# Patient Record
Sex: Female | Born: 1970 | Race: White | Hispanic: No | Smoking: Current every day smoker
Health system: Southern US, Community
[De-identification: ages and names within clinical notes are randomized; demographics above are authoritative.]

## PROBLEM LIST (undated history)

## (undated) DIAGNOSIS — K219 Gastro-esophageal reflux disease without esophagitis: Secondary | ICD-10-CM

## (undated) DIAGNOSIS — F4001 Agoraphobia with panic disorder: Secondary | ICD-10-CM

## (undated) DIAGNOSIS — F32A Depression, unspecified: Secondary | ICD-10-CM

## (undated) DIAGNOSIS — Z9889 Other specified postprocedural states: Secondary | ICD-10-CM

## (undated) DIAGNOSIS — R42 Dizziness and giddiness: Secondary | ICD-10-CM

## (undated) DIAGNOSIS — E119 Type 2 diabetes mellitus without complications: Secondary | ICD-10-CM

## (undated) DIAGNOSIS — G43909 Migraine, unspecified, not intractable, without status migrainosus: Secondary | ICD-10-CM

## (undated) DIAGNOSIS — K449 Diaphragmatic hernia without obstruction or gangrene: Secondary | ICD-10-CM

## (undated) DIAGNOSIS — I10 Essential (primary) hypertension: Secondary | ICD-10-CM

## (undated) DIAGNOSIS — Z8673 Personal history of transient ischemic attack (TIA), and cerebral infarction without residual deficits: Secondary | ICD-10-CM

## (undated) DIAGNOSIS — H8110 Benign paroxysmal vertigo, unspecified ear: Secondary | ICD-10-CM

## (undated) DIAGNOSIS — S6010XA Contusion of unspecified finger with damage to nail, initial encounter: Secondary | ICD-10-CM

## (undated) DIAGNOSIS — M779 Enthesopathy, unspecified: Secondary | ICD-10-CM

## (undated) DIAGNOSIS — M542 Cervicalgia: Secondary | ICD-10-CM

## (undated) DIAGNOSIS — G629 Polyneuropathy, unspecified: Secondary | ICD-10-CM

## (undated) DIAGNOSIS — K589 Irritable bowel syndrome without diarrhea: Secondary | ICD-10-CM

## (undated) DIAGNOSIS — F329 Major depressive disorder, single episode, unspecified: Secondary | ICD-10-CM

## (undated) DIAGNOSIS — E785 Hyperlipidemia, unspecified: Secondary | ICD-10-CM

## (undated) HISTORY — DX: Benign paroxysmal vertigo, unspecified ear: H81.10

## (undated) HISTORY — PX: TOE SURGERY: SHX1073

## (undated) HISTORY — PX: ABDOMINAL HYSTERECTOMY: SHX81

## (undated) HISTORY — PX: CARDIAC CATHETERIZATION: SHX172

## (undated) HISTORY — PX: TUBAL LIGATION: SHX77

## (undated) HISTORY — PX: APPENDECTOMY: SHX54

## (undated) HISTORY — DX: Enthesopathy, unspecified: M77.9

## (undated) HISTORY — PX: CYST EXCISION: SHX5701

## (undated) HISTORY — DX: Agoraphobia with panic disorder: F40.01

## (undated) HISTORY — DX: Personal history of transient ischemic attack (TIA), and cerebral infarction without residual deficits: Z86.73

## (undated) HISTORY — DX: Other specified postprocedural states: Z98.890

## (undated) HISTORY — PX: BUNIONECTOMY: SHX129

## (undated) HISTORY — DX: Type 2 diabetes mellitus without complications: E11.9

## (undated) HISTORY — DX: Contusion of unspecified finger with damage to nail, initial encounter: S60.10XA

## (undated) HISTORY — PX: CHOLECYSTECTOMY: SHX55

## (undated) HISTORY — DX: Polyneuropathy, unspecified: G62.9

## (undated) HISTORY — DX: Cervicalgia: M54.2

---

## 1999-08-25 ENCOUNTER — Ambulatory Visit (HOSPITAL_COMMUNITY): Admission: RE | Admit: 1999-08-25 | Discharge: 1999-08-25 | Payer: Self-pay

## 2000-12-21 ENCOUNTER — Encounter: Admission: RE | Admit: 2000-12-21 | Discharge: 2001-03-21 | Payer: Self-pay | Admitting: Internal Medicine

## 2001-06-12 ENCOUNTER — Emergency Department (HOSPITAL_COMMUNITY): Admission: EM | Admit: 2001-06-12 | Discharge: 2001-06-12 | Payer: Self-pay | Admitting: Emergency Medicine

## 2001-10-17 ENCOUNTER — Ambulatory Visit (HOSPITAL_COMMUNITY): Admission: RE | Admit: 2001-10-17 | Discharge: 2001-10-17 | Payer: Self-pay | Admitting: Family Medicine

## 2001-10-17 ENCOUNTER — Encounter: Payer: Self-pay | Admitting: Family Medicine

## 2001-10-20 ENCOUNTER — Ambulatory Visit (HOSPITAL_COMMUNITY): Admission: RE | Admit: 2001-10-20 | Discharge: 2001-10-20 | Payer: Self-pay | Admitting: Family Medicine

## 2001-10-20 ENCOUNTER — Encounter: Payer: Self-pay | Admitting: Family Medicine

## 2002-03-16 ENCOUNTER — Ambulatory Visit (HOSPITAL_COMMUNITY): Admission: RE | Admit: 2002-03-16 | Discharge: 2002-03-16 | Payer: Self-pay | Admitting: Family Medicine

## 2002-03-16 ENCOUNTER — Encounter: Payer: Self-pay | Admitting: Family Medicine

## 2002-06-21 ENCOUNTER — Emergency Department (HOSPITAL_COMMUNITY): Admission: EM | Admit: 2002-06-21 | Discharge: 2002-06-21 | Payer: Self-pay | Admitting: Emergency Medicine

## 2002-09-23 ENCOUNTER — Emergency Department (HOSPITAL_COMMUNITY): Admission: EM | Admit: 2002-09-23 | Discharge: 2002-09-23 | Payer: Self-pay | Admitting: Internal Medicine

## 2002-09-23 ENCOUNTER — Encounter: Payer: Self-pay | Admitting: *Deleted

## 2002-10-05 ENCOUNTER — Ambulatory Visit (HOSPITAL_COMMUNITY): Admission: RE | Admit: 2002-10-05 | Discharge: 2002-10-05 | Payer: Self-pay | Admitting: Family Medicine

## 2002-10-05 ENCOUNTER — Encounter: Payer: Self-pay | Admitting: Family Medicine

## 2002-10-22 ENCOUNTER — Ambulatory Visit (HOSPITAL_COMMUNITY): Admission: RE | Admit: 2002-10-22 | Discharge: 2002-10-22 | Payer: Self-pay | Admitting: Family Medicine

## 2002-10-22 ENCOUNTER — Encounter: Payer: Self-pay | Admitting: Family Medicine

## 2002-11-29 ENCOUNTER — Ambulatory Visit (HOSPITAL_COMMUNITY): Admission: RE | Admit: 2002-11-29 | Discharge: 2002-11-29 | Payer: Self-pay | Admitting: Family Medicine

## 2002-11-29 ENCOUNTER — Encounter: Payer: Self-pay | Admitting: Family Medicine

## 2003-05-09 ENCOUNTER — Emergency Department (HOSPITAL_COMMUNITY): Admission: EM | Admit: 2003-05-09 | Discharge: 2003-05-09 | Payer: Self-pay | Admitting: Internal Medicine

## 2003-07-30 ENCOUNTER — Emergency Department (HOSPITAL_COMMUNITY): Admission: EM | Admit: 2003-07-30 | Discharge: 2003-07-30 | Payer: Self-pay | Admitting: Internal Medicine

## 2003-10-30 ENCOUNTER — Observation Stay (HOSPITAL_COMMUNITY): Admission: EM | Admit: 2003-10-30 | Discharge: 2003-10-31 | Payer: Self-pay | Admitting: *Deleted

## 2004-02-26 ENCOUNTER — Emergency Department (HOSPITAL_COMMUNITY): Admission: EM | Admit: 2004-02-26 | Discharge: 2004-02-26 | Payer: Self-pay | Admitting: *Deleted

## 2004-06-04 ENCOUNTER — Emergency Department (HOSPITAL_COMMUNITY): Admission: EM | Admit: 2004-06-04 | Discharge: 2004-06-04 | Payer: Self-pay | Admitting: Emergency Medicine

## 2004-06-26 ENCOUNTER — Ambulatory Visit (HOSPITAL_COMMUNITY): Admission: RE | Admit: 2004-06-26 | Discharge: 2004-06-26 | Payer: Self-pay | Admitting: Family Medicine

## 2004-08-07 ENCOUNTER — Ambulatory Visit (HOSPITAL_COMMUNITY): Admission: RE | Admit: 2004-08-07 | Discharge: 2004-08-07 | Payer: Self-pay | Admitting: Family Medicine

## 2004-08-08 ENCOUNTER — Ambulatory Visit: Payer: Self-pay | Admitting: Internal Medicine

## 2004-08-08 ENCOUNTER — Inpatient Hospital Stay (HOSPITAL_COMMUNITY): Admission: RE | Admit: 2004-08-08 | Discharge: 2004-08-10 | Payer: Self-pay | Admitting: Family Medicine

## 2004-08-28 ENCOUNTER — Ambulatory Visit: Payer: Self-pay | Admitting: Internal Medicine

## 2004-09-01 ENCOUNTER — Ambulatory Visit (HOSPITAL_COMMUNITY): Admission: RE | Admit: 2004-09-01 | Discharge: 2004-09-01 | Payer: Self-pay | Admitting: Internal Medicine

## 2004-09-01 ENCOUNTER — Ambulatory Visit: Payer: Self-pay | Admitting: Internal Medicine

## 2004-09-30 ENCOUNTER — Ambulatory Visit: Payer: Self-pay | Admitting: Internal Medicine

## 2005-03-23 ENCOUNTER — Ambulatory Visit: Payer: Self-pay | Admitting: Urgent Care

## 2005-03-25 ENCOUNTER — Ambulatory Visit (HOSPITAL_COMMUNITY): Admission: RE | Admit: 2005-03-25 | Discharge: 2005-03-25 | Payer: Self-pay | Admitting: Internal Medicine

## 2005-04-20 ENCOUNTER — Observation Stay (HOSPITAL_COMMUNITY): Admission: EM | Admit: 2005-04-20 | Discharge: 2005-04-21 | Payer: Self-pay | Admitting: Emergency Medicine

## 2005-06-07 ENCOUNTER — Emergency Department (HOSPITAL_COMMUNITY): Admission: EM | Admit: 2005-06-07 | Discharge: 2005-06-07 | Payer: Self-pay | Admitting: Emergency Medicine

## 2005-06-11 ENCOUNTER — Ambulatory Visit (HOSPITAL_COMMUNITY): Admission: RE | Admit: 2005-06-11 | Discharge: 2005-06-11 | Payer: Self-pay | Admitting: Family Medicine

## 2005-06-14 ENCOUNTER — Ambulatory Visit (HOSPITAL_COMMUNITY): Admission: RE | Admit: 2005-06-14 | Discharge: 2005-06-14 | Payer: Self-pay | Admitting: Internal Medicine

## 2005-06-16 ENCOUNTER — Ambulatory Visit: Payer: Self-pay | Admitting: Internal Medicine

## 2005-06-17 ENCOUNTER — Emergency Department (HOSPITAL_COMMUNITY): Admission: EM | Admit: 2005-06-17 | Discharge: 2005-06-18 | Payer: Self-pay | Admitting: Emergency Medicine

## 2005-07-03 ENCOUNTER — Emergency Department (HOSPITAL_COMMUNITY): Admission: EM | Admit: 2005-07-03 | Discharge: 2005-07-03 | Payer: Self-pay | Admitting: Emergency Medicine

## 2005-07-06 ENCOUNTER — Ambulatory Visit: Payer: Self-pay | Admitting: Internal Medicine

## 2005-07-06 ENCOUNTER — Ambulatory Visit (HOSPITAL_COMMUNITY): Admission: RE | Admit: 2005-07-06 | Discharge: 2005-07-06 | Payer: Self-pay | Admitting: Internal Medicine

## 2005-09-03 ENCOUNTER — Ambulatory Visit: Payer: Self-pay | Admitting: Cardiology

## 2005-09-05 ENCOUNTER — Inpatient Hospital Stay (HOSPITAL_COMMUNITY): Admission: AD | Admit: 2005-09-05 | Discharge: 2005-09-07 | Payer: Self-pay | Admitting: Cardiovascular Disease

## 2005-09-05 ENCOUNTER — Ambulatory Visit: Payer: Self-pay | Admitting: Cardiovascular Disease

## 2005-11-16 ENCOUNTER — Ambulatory Visit (HOSPITAL_COMMUNITY): Admission: RE | Admit: 2005-11-16 | Discharge: 2005-11-16 | Payer: Self-pay | Admitting: Internal Medicine

## 2005-12-22 ENCOUNTER — Ambulatory Visit (HOSPITAL_COMMUNITY): Admission: RE | Admit: 2005-12-22 | Discharge: 2005-12-22 | Payer: Self-pay | Admitting: Family Medicine

## 2006-01-19 ENCOUNTER — Ambulatory Visit (HOSPITAL_COMMUNITY): Admission: RE | Admit: 2006-01-19 | Discharge: 2006-01-19 | Payer: Self-pay | Admitting: Family Medicine

## 2006-10-11 ENCOUNTER — Ambulatory Visit: Payer: Self-pay | Admitting: Cardiology

## 2006-10-14 ENCOUNTER — Ambulatory Visit: Payer: Self-pay | Admitting: Cardiology

## 2006-10-19 ENCOUNTER — Inpatient Hospital Stay (HOSPITAL_BASED_OUTPATIENT_CLINIC_OR_DEPARTMENT_OTHER): Admission: RE | Admit: 2006-10-19 | Discharge: 2006-10-19 | Payer: Self-pay | Admitting: Cardiology

## 2006-10-19 ENCOUNTER — Ambulatory Visit: Payer: Self-pay | Admitting: Cardiology

## 2006-12-14 ENCOUNTER — Emergency Department (HOSPITAL_COMMUNITY): Admission: EM | Admit: 2006-12-14 | Discharge: 2006-12-14 | Payer: Self-pay | Admitting: *Deleted

## 2007-04-26 ENCOUNTER — Ambulatory Visit (HOSPITAL_COMMUNITY): Admission: RE | Admit: 2007-04-26 | Discharge: 2007-04-26 | Payer: Self-pay | Admitting: Family Medicine

## 2007-11-21 ENCOUNTER — Ambulatory Visit: Payer: Self-pay | Admitting: Cardiovascular Disease

## 2007-11-21 ENCOUNTER — Ambulatory Visit: Payer: Self-pay | Admitting: Cardiology

## 2007-11-21 ENCOUNTER — Inpatient Hospital Stay (HOSPITAL_COMMUNITY): Admission: AD | Admit: 2007-11-21 | Discharge: 2007-11-22 | Payer: Self-pay | Admitting: Cardiology

## 2008-09-03 ENCOUNTER — Ambulatory Visit: Payer: Self-pay | Admitting: Cardiology

## 2010-08-22 ENCOUNTER — Encounter: Payer: Self-pay | Admitting: Emergency Medicine

## 2010-08-22 ENCOUNTER — Encounter (INDEPENDENT_AMBULATORY_CARE_PROVIDER_SITE_OTHER): Payer: Self-pay | Admitting: Internal Medicine

## 2010-08-23 ENCOUNTER — Encounter (INDEPENDENT_AMBULATORY_CARE_PROVIDER_SITE_OTHER): Payer: Self-pay | Admitting: Internal Medicine

## 2010-08-23 ENCOUNTER — Encounter: Payer: Self-pay | Admitting: Internal Medicine

## 2010-09-18 DIAGNOSIS — R079 Chest pain, unspecified: Secondary | ICD-10-CM

## 2010-12-15 NOTE — Discharge Summary (Signed)
NAMEFELMA, PFEFFERLE NO.:  [id number]   MEDICAL RECORD NO.:  [id number]          PATIENT TYPE:  INP   LOCATION:  2033                         FACILITY:  [facility]   PHYSICIAN:  Jonelle Sidle, MD DATE OF BIRTH:  03/05/1971   DATE OF ADMISSION:  11/21/2007  DATE OF DISCHARGE:  11/22/2007                               DISCHARGE SUMMARY   PROCEDURES:  1. Cardiac catheterization.  2. Coronary arteriogram.  3. Left ventriculogram.  4. CT angiogram of the chest.   PRIMARY FINAL DISCHARGE DIAGNOSIS:  Chest pain, troponin I 0.42 at  [facility] with normal coronary arteries and no acute chest disease by CT.   SECONDARY DIAGNOSES:  1. Insulin-dependent diabetes.  2. Hypertension.  3. Hyperlipidemia.  4. Tobacco use.  5. Family history of coronary artery disease.  6. Allergy or intolerance to penicillin and sulfa.   TIME AT DISCHARGE:  45 minutes.   HOSPITAL COURSE:  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  She  went to [facility] for chest pain.  She was evaluated there by  Dr.DeGent and had elevation of troponin to 0.42.  She had a history of  an abnormal Cardiolite prior to a cath in 2008 showing nonobstructive  disease, so it was felt that the best option was to do a repeat heart  catheterization.  She was transferred to [facility].   The cardiac catheterization showed no significant coronary artery  disease.  Her EF was 55%-60% with no MR.  Dr. Excell Seltzer felt that with  significant chest pain and abnormal cardiac enzymes, a D-dimer should be  checked and this was also mildly abnormal.  With an abnormal D-dimer, a  chest CT was performed.  It showed no pulmonary embolus and no acute  process with the earlier noted lung nodules resolved.   On November 22, 2007, [name] had significantly improved.  She was having  no chest pain and no shortness of breath.  Her O2 saturation was  95% on  room air and her cath site was without bruit or hematoma.  Dr. Diona Browner  evaluated her and felt that she could be safely discharged home with  outpatient followup in [place].   DISCHARGE INSTRUCTIONS:  1. Her activity level is to be increased gradually.  2. She is to call our office for any problems with the cath site.  3. She is to stick to a low-sodium diabetic diet.  4. She is to follow up with Dr. Andee Lineman on May 6 at 1:30 and with Dr.      Antoine Poche as needed.   DISCHARGE MEDICATIONS:  1. Lisinopril 10 mg daily.  2. Prevacid 20 mg a day.  3. Reglan 10 mg 4 times a day.  4. Lexapro 10 mg a day.  5. Zocor 10 mg a day.  6. Premarin 1.25 mg daily.  7. 75/25 insulin 40 units q.a.m.  8. Lantus 30 units at bedtime.  9. Sliding scale as at home.  10.Metoprolol 25 mg b.i.d.  11.Aspirin 81 mg daily.  Theodore Demark, PA-C      Jonelle Sidle, MD  Electronically Signed    RB/MEDQ  D:  11/22/2007  T:  11/23/2007  Job:  [id number]   cc:   Rollene Rotunda, MD, [facility]

## 2010-12-15 NOTE — Cardiovascular Report (Signed)
[name], [name] NO.:  [id number]   MEDICAL RECORD NO.:  [id number]          PATIENT TYPE:  INP   LOCATION:  2033                         FACILITY:  [facility]   PHYSICIAN:  Veverly Fells. Excell Seltzer, MD  DATE OF BIRTH:  [birth year]   DATE OF PROCEDURE:  11/21/2007  DATE OF DISCHARGE:                            CARDIAC CATHETERIZATION   PROCEDURE:  1. Left heart catheterization.  2. Selective coronary angiography.  3. Left ventricular angiography.   INDICATIONS:  [name] is a 40 year old woman with multiple cardiac  risk factors.  This includes type 1 diabetes with an insulin pump,  hypertension, hypercholesterolemia, tobacco use, and a strong family  history.  She presented with chest pain consistent with an acute  coronary syndrome.  Her initial biomarkers at [facility] were  positive with the troponin of 0.4.  Her biomarkers here at [facility]  are negative.  However, she has ongoing chest pain and was referred for  cardiac cath.   Risks and indications of the procedure were reviewed with the patient.  Informed consent was obtained.  The right groin was prepped, draped, and  anesthetized with 1% lidocaine.  Using modified Seldinger technique a 5-  French sheath was placed in the right femoral artery.  Standard 5-French  Judkins catheters were used for coronary angiography and left  ventriculography.  The patient tolerated the procedure well.  All  catheter exchanges were performed over a guidewire.  There were no  immediate complications.   FINDINGS:  Aortic pressure 92/56 with a mean of 73 and left ventricular  pressure 98/10.   Left mainstem:  Widely patent bifurcates into LAD and left circumflex.   LAD:  LAD is widely patent.  The course is down to the LV apex.  It is a  large vessel that supplies a moderate-sized first diagonal branch.  There is no significant plaque formation throughout.   Left circumflex:  Left circumflex is a large vessel  that is dominant.  There is a small ramus intermedius, small first OM, large second and  third OM branches, and a moderate-sized left PDA.  There is no  significant stenosis throughout the left circumflex.   Right coronary artery:  The right coronary artery is small and  nondominant.  It supplies two RV marginal branches.  There is no  significant stenosis throughout.   Left ventricular function is normal.  The LVEF is 55% to 60%.  There is  no mitral regurgitation.  The left ventriculography was performed in the  30 degrees RAO projection.   ASSESSMENT:  1. Normal coronary arteries.  2. Normal left ventricular function.   PLAN:  [name] appears to have noncardiac chest pain.  She has normal  coronaries and normal LV function.  I will check a D-dimer, but I  suspect this is either GI or musculoskeletal in nature.  I will observe  overnight, follow her clinically in the morning.      Veverly Fells. Excell Seltzer, MD  Electronically Signed     MDC/MEDQ  D:  11/21/2007  T:  11/22/2007  Job:  [id number]

## 2010-12-18 NOTE — H&P (Signed)
[name], [name]                          ACCOUNT NO.:  [id number]   MEDICAL RECORD NO.:  [id number]                   PATIENT TYPE:  INP   LOCATION:  IC10                                 FACILITY:  [facility]   PHYSICIAN:  Patrica Duel, M.D.                 DATE OF BIRTH:  Jul 25, 1971   DATE OF ADMISSION:  10/29/2003  DATE OF DISCHARGE:                                HISTORY & PHYSICAL   CHIEF COMPLAINT:  Chest pain.   HISTORY OF PRESENT ILLNESS:  This is a 40 year old female with a history of  insulin-dependent diabetes mellitus times approximately 24 years.  She is  currently well-controlled with the use of insulin pump.  She is also status  post cholecystectomy.  She has anxiety and depression relatively well  controlled at this time as well.   The patient presented to the emergency department with a several-week  history of increasingly severe and reproducible substernal chest pain  primarily the left anterior chest, with radiation to the left shoulder and  back.  She also has associated dyspnea and occasional diaphoretic episodes.  She has had no palpitations or syncopal episodes.   The emergency room workup was essentially benign.  D-Dimer was normal.  Cardiac enzymes were normal with EKG nonacute.   The patient was admitted for thorough cardiac evaluation to rule out  premature coronary disease.  Her risk factors include diabetes as well as a  strongly positive family history.  Her mother died at age 15 from myocardial  infarction and cerebrovascular accident as well.   There is no history of headaches, neurologic deficits, nausea, vomiting,  diarrhea, melena, hematemesis, hematochezia or genitourinary symptoms.   CURRENT MEDICATIONS:  1. Wellbutrin 150 daily.  2. Clonazepam 0.5 q.i.d. p.r.n.  3. Celexa 20 mg daily.  4. She is also on a Novolog insulin pump.  5. Topamax 100 b.i.d.  6. Hyoscyamine 0.125 p.r.n.   ALLERGIES:  PENICILLIN.   PAST MEDICAL HISTORY:  As  noted above.   FAMILY HISTORY:  As noted.  One sister has a history of blood clots.  Father is alive and well.   REVIEW OF SYSTEMS:  Negative except for as mentioned.   SOCIAL HISTORY:  She is a nonsmoker, nondrinker.   PHYSICAL EXAMINATION:  GENERAL:  A very-pleasant female, in no acute  distress.  VITAL SIGNS:  Blood pressure 122/77, heart rate 75 and regular.  She is  afebrile.  Respirations 18.  O2 saturations 99%.  HEENT:  Normocephalic, atraumatic.  Pupils are equal.  Ears, nose and throat  are benign.  NECK:  Supple, no bruits are noted.  HEART:  Sounds are essentially normal.  LUNGS:  Clear.  ABDOMEN:  Nontender, nondistended.  Bowel sounds are intact.  EXTREMITIES:  Without clubbing, cyanosis or edema.  NEUROLOGIC:  Exam is within normal limits.   LABORATORY DATA:  As noted.   ASSESSMENT:  Chest pain  in a very young female with multiple risk factors.   PLAN:  1. Cardiolite stress as soon as possible.  2. Further evaluation and therapy as per cardiology.  3. We will follow with you expectantly.     ___________________________________________                                         Patrica Duel, M.D.   MC/MEDQ  D:  10/30/2003  T:  10/30/2003  Job:  [id number]

## 2010-12-18 NOTE — Cardiovascular Report (Signed)
NAMEKIRBI, FARRUGIA NO.:  [id number]   MEDICAL RECORD NO.:  [id number]          PATIENT TYPE:  INP   LOCATION:  2926                         FACILITY:  [facility]   PHYSICIAN:  Arturo Morton. Riley Kill, M.D. [facility] OF BIRTH:  07/31/1971   DATE OF PROCEDURE:  09/06/2005  DATE OF DISCHARGE:                              CARDIAC CATHETERIZATION   INDICATIONS:  [name] is a 40 year old who has type 1 diabetes mellitus.  She is on an insulin pump. She recently presented with recurrent episodes of  substernal chest pain. She has started smoking over the past two years and  just quit passed over the weekend. She is on estrogen as well. She had a CT  angiogram which suggested some haziness possibly in the LAD and a result of  this was subsequently referred for diagnostic catheterization.   PROCEDURES:  1.  Left heart catheterization.  2.  Selective coronary arteriography.  3.  Selective left ventriculography   DESCRIPTION OF PROCEDURE:  The patient was brought to the catheterization  laboratory after informed consent and prepped and draped in the usual  fashion. Through an anterior puncture, the right femoral artery was entered  easily. We tried to place a 4-French sheath, but this would not get through  the vessel very well. We ended up using the dilator and then placed a 6-  [place] sheath which slid in very nicely. A brief femoral angiogram was done  which documented the entry site to be above the bifurcation. Following this,  views of the left and right coronary arteries were obtained. Central aortic  and left ventricular pressures were measured with pigtail. Ventriculography  was performed in the RAO projection without complication. She was taken to  the holding area in satisfactory clinical condition. She also had an Accu-  Chek that was 47. We stopped her insulin pump. We gave her some apple juice.  A recheck was in the mid 60s. We also started a dextrose drip.   HEMODYNAMIC DATA:  1.  Central aortic pressure 120/70.  2.  Left ventricular pressure 111/20.  3.  No gradient on pullback across the aortic valve.   ANGIOGRAPHIC DATA:  1.  Ventriculography was performed in the RAO projection. Overall systolic      function appeared to preserved. Overall ejection fraction felt to be in      excess of 55-60%. There did not appear to be significant mitral      regurgitation.  2.  The right coronary was a nondominant small caliber vessel.  3.  The left main is a large-caliber vessel that bifurcates into an LAD and      circumflex system. The circumflex itself was dominant. The left main is      free of critical disease.  4.  The left anterior descending artery courses to the apex. There was one      major diagonal branch. The LAD appears to be smooth throughout without      any evidence of obvious haziness. We were able to obtain views in      multiple angiographic projections. Likewise,  the takeoff of the diagonal      did not appear to be significantly compromised. We were able to see this      in both the LAO views, the RAO cranial views and in the LAO caudal      views. There did not appear to be significant focal area of obstruction.  5.  The circumflex is a dominant vessel. There is an insignificant      intermediate vessel.  6.  There is a large obtuse marginal that is free of critical disease. There      is a second moderate-sized obtuse marginal. It has minimal proximal      irregularity but no significant focal obstruction. The distal circumflex      provides a posterior descending branch which appears to be free of      critical obstruction.   CONCLUSION:  1.  Well-preserved left ventricular function.  2.  No evidence of high-grade stenosis or ruptured plaque in the left      anterior descending artery.  3.  Nondominant right coronary artery.   DISPOSITION:  Further evaluation by Dr. Andee Lineman. We will stop the patient's   anticoagulants.      Arturo Morton. Riley Kill, M.D. [facility]  Electronically Signed     TDS/MEDQ  D:  09/06/2005  T:  09/06/2005  Job:  [id number]   cc:   Learta Codding, M.D. [facility]  [address]  [address]  [place]  [place] [address]   CV Laboratory   Patient's medical record   Patrica Duel, M.D.  Fax: [phone]   Sherrie Mustache, M.D.  Fax: [phone]   Corrie Mckusick, M.D.  Fax: [phone]   Wallingford Bing, M.D. [facility]  [address]  [address]  [place]  [place] [address]

## 2010-12-18 NOTE — Cardiovascular Report (Signed)
NAMELAILIE, Mckenzie NO.:  [id number]   MEDICAL RECORD NO.:  [id number]          PATIENT TYPE:  OIB   LOCATION:  NA                           FACILITY:  [facility]   PHYSICIAN:  Jonelle Sidle, MD DATE OF BIRTH:  07-04-1971   DATE OF PROCEDURE:  DATE OF DISCHARGE:                            CARDIAC CATHETERIZATION   INDICATIONS:  [name] is a 40 year old woman with a history of type 1  diabetes mellitus, hypertension, hyperlipidemia, gastroesophageal reflux  disease and diabetic neuropathy as well as gastroparesis.  She was  recently admitted to [facility] with chest pain and  ruled out for myocardial infarction.  Her cardiac history includes a  previous cardiac CT scan in February of 2007 demonstrating some  haziness in the left anterior descending which ultimately resulted in  a cardiac catheterization revealing no significant coronary artery  disease.  More recently, she underwent a myocardial perfusion study  which indicated a small to moderate defect in the anterior wall  suggestive of ischemia with an ejection fraction of 60%.  This  information was reviewed with the patient and she was scheduled for a  diagnostic cardiac catheterization to redefine the coronary anatomy.  The potential risks and benefits were explained to her in advance and  informed consent was obtained.   PROCEDURES PERFORMED:  1. Left heart catheterization.  2. Selective coronary angiography.  3. Left ventriculography.   ACCESS AND EQUIPMENT:  The area about the right femoral artery was  anesthetized with 1% lidocaine and a 4-French sheath was placed in the  right femoral artery via modified Seldinger technique.  Standard  preformed 4-French JL-4 and 3-D RCA catheters were used for selective  coronary angiography and an angled pigtail catheter was used for left  heart catheterization left ventriculography.  All exchanges were made  over a wire.  A total of 70 mL  Omnipaque were used.  The patient  tolerated the procedure well without immediate complications.   HEMODYNAMICS:  Aorta:  103/64 mmHg.  Left ventricle:  103/11 mmHg.   ANGIOGRAPHIC FINDINGS:  1. The left main coronary artery is smooth and gives rise to the left      anterior descending, the circumflex coronary artery and a small      ramus intermedius branch.  2. The left anterior descending is a medium-sized vessel with one      large proximal diagonal branch.  The left anterior descending      tapers beginning in the mid vessel distally without obvious focal      stenosis.  This area may represent an anatomical decrease in vessel      caliber; however, diffuse atherosclerosis in the setting of      diabetes is also certainly possible.  No clear flow-limiting      stenosis is noted, however.  3. The circumflex coronary artery is a medium to large caliber      dominant vessel.  There are two obtuse marginal branches that are      large and a posterior descending system.  Some mild luminal  irregularities are noted predominantly within the second obtuse      marginal branch, but no flow-limiting stenoses are noted.  4. There is a small ramus intermedius noted without flow-limiting      stenosis.  5. There is a small nondominant right coronary artery with small right      ventricular marginal branches.  No significant flow-limiting      stenosis noted.   Left ventriculography was performed and the RAO projection reveals an  ejection fraction of approximately 60% with no focal wall motion  abnormality and no significant mitral regurgitation.   DIAGNOSES:  1. No obstructive coronary artery disease noted within the major      epicardial vessels.  There is some tapering of the mid to distal      left anterior descending which may be anatomic versus possibly      indicative of atherosclerotic plaque, particularly in the setting      of diabetes mellitus.  This area does not appear to  be clearly flow-      limiting however.  2. Left ventricular ejection fraction is approximately 60% with no      aortic valve pullback gradient, no significant mitral      regurgitation, and a left ventricular end-diastolic pressure of 11      mmHg.   DISCUSSION:  I reviewed results with the patient, her family member and  Dr. Andee Lineman by phone.  At this point, would anticipate medical therapy  for general risk factor modification.  She more than likely does have  endothelial dysfunction which could certainly be angina provoking and  anti-anginal therapy would also be reasonable.  She has already been  placed on Imdur.  Aggressive diabetes and lipid management are also  clearly important.  I reviewed this with the patient and she will follow  up in the [facility] office with Dr. Andee Lineman.      Jonelle Sidle, MD  Electronically Signed     SGM/MEDQ  D:  10/19/2006  T:  10/19/2006  Job:  [id number]   cc:   Learta Codding, MD,FACC

## 2010-12-18 NOTE — Discharge Summary (Signed)
[name], [name]                ACCOUNT NO.:  [id number]   MEDICAL RECORD NO.:  [id number]          PATIENT TYPE:  INP   LOCATION:  A207                          FACILITY:  [facility]   PHYSICIAN:  Patrica Duel, M.D.    DATE OF BIRTH:  [birth year]   DATE OF ADMISSION:  08/07/2004  DATE OF DISCHARGE:  LH                                 DISCHARGE SUMMARY   DISCHARGE DIAGNOSES:  1.  Chest pain, somewhat atypical, question acute coronary syndrome.  2.  Insulin-dependent diabetes mellitus onset age 29, currently controlled.  3.  Migraine headaches.  4.  Moderate anxiety and depression.  5.  Status post hysterectomy, currently on estrogen therapy.   For details regarding admission, please refer to the chart.   This is a 40 year old female who presented to the office with a three day  history of increasingly severe intermittent chest tightness and mild  dyspnea.  Office evaluation was benign.  She was sent to the emergency  department where cardiac markers were negative.  D-dimer normal and O2 sats  98%.  She was admitted with chest pain, consider angina pectoris.   COURSE IN THE HOSPITAL:  The patient has been stable overnight.  She has had  some recurring chest tightness.  Cardiac enzymes have remained stable or  negative, and her vital signs are normal.  An EKG obtained this morning  revealed a slight degree of T wave inversion in the anteroseptal leads of  questionable significance.   Given the patient's risk factors and EKG changes, it is deemed necessary to  send her to [facility] for probable cardiac catheterization.  I have  discussed the situation with Dr. Domingo Sep, and she will accept the patient  in transfer.  [facility] will be transferring the patient.   DISPOSITION:  As per [facility].  She will be followed expectantly upon  her return.      MC/MEDQ  D:  08/08/2004  T:  08/08/2004  Job:  [id number]

## 2010-12-18 NOTE — Op Note (Signed)
[name], [name]                ACCOUNT NO.:  [id number]   MEDICAL RECORD NO.:  [id number]          PATIENT TYPE:  AMB   LOCATION:  DAY                           FACILITY:  [facility]   PHYSICIAN:  Lionel December, M.D.    DATE OF BIRTH:  1970/10/14   DATE OF PROCEDURE:  DATE OF DISCHARGE:                                 OPERATIVE REPORT   PROCEDURE:  Esophagogastroduodenoscopy with esophageal dilatation.   INDICATIONS:  [name] is a 40 year old Caucasian female with atypical  chest pain whose cardiac evaluation including cardiac catheterization,  Cardiolite, and echocardiography have all been normal.  She has been on PPI  without symptomatic improvement.  She had chest CT which suggested  thickening to the distal esophagus.  She is therefore here for evaluation.  She denies frequent heartburn.  She has intermittent dysphagia to solids as  well as liquids.  She says her symptoms are worse at night, and she has some  difficulty breathing and has to walk around to get relief.  Her LFTs done  this morning are within normal limits.  Her AST is 19 and ALT 18.   Procedure risks were reviewed with the patient, and informed consent was  obtained.   PREOPERATIVE MEDICATIONS:  Cetacaine spray for pharyngeal topical  anesthesia, Demerol 50 mg IV, and Versed 8 mg IV in divided doses.   FINDINGS:  Procedure was performed in endoscopy suite.  The patient's vital  signs and O2 saturation were monitored during the procedure and remained  stable.  The patient was placed in the left lateral position.  The Olympus  video endoscope was passed through the oropharynx without any difficulty  into the esophagus.   Esophagus:  Mucosa of the esophagus was normal except there was some  erythema at the GE junction which was located at 38 cm.  The GE junction was  wavy, but no erosions or ulcers were noted.  The hiatus was at 40 cm.  She  had small sliding hiatal hernia.  The stomach was empty, and it  distended  very well with insufflation.  Folds in the proximal stomach were normal.  Examination of the mucosa, gastric body, pyloric channel, as well as  angularis, fundus, and cardia were normal.   Duodenum:  The bulbar mucosa was normal.  The scope was passed into the  second part of the duodenal mucosa, and folds were normal.  The endoscope  was withdrawn.   The esophagus was dilated by passing a 56-French Maloney dilator to full  insertion.  As the dilator was withdrawn, esophageal mucosa was reexamined.  There was no mucosal disruption.  The endoscope was withdrawn.  The patient  tolerated the procedure well.   FINAL DIAGNOSES:  Mild changes of reflux esophagitis limited to  gastroesophageal junction without ring or stricture formation.  Small  sliding hiatal hernia.   Normal examination of the stomach, first and second part of the duodenum.   Even though the patient's symptoms are atypical, she still could be  refluxing.   RECOMMENDATIONS:  Antireflux measures were reinforced.  Will increase her  Protonix to 40 mg before breakfast and evening meals.  Next, add OTC Zantac  150 mg at bedtime.  She will keep a symptomatic diary and return for OV in 4  weeks.  If symptoms persist, will consider esophageal manometry and a pH  study prior to solid phase gastric emptying study.      NR/MEDQ  D:  09/01/2004  T:  09/01/2004  Job:  [id number]   cc:   Corrie Mckusick, M.D.  Fax: [phone]   Dorisann Frames, M.D.  [address], [place] [address]  Fax: [phone]

## 2010-12-18 NOTE — Discharge Summary (Signed)
[name], [name] NO.:  [id number]   MEDICAL RECORD NO.:  [id number]          PATIENT TYPE:  INP   LOCATION:  2021                         FACILITY:  [facility]   PHYSICIAN:  Arvilla Meres, M.D. LHCDATE OF BIRTH:  Dec 30, 1970   DATE OF ADMISSION:  09/05/2005  DATE OF DISCHARGE:  09/07/2005                                 DISCHARGE SUMMARY   PRIMARY DIAGNOSES:  1.  Chest pain, cardiac catheterization without significant coronary artery      disease and a normal ejection fraction, D-dimer within normal limits      possibly secondary to gastroesophageal reflux disease symptoms.  2.  Insulin dependent diabetes mellitus.  3.  A strong family history of premature coronary artery disease.  4.  History of tobacco use, quit a week ago.  5.  History of depression/anxiety.  6.  Irritable bowel syndrome, normal colonoscopy.  7.  History of migraine.  8.  History of hepatic hemangioma.  9.  History of gastroesophageal reflux disease/small hiatal hernia by EGD.   ALLERGY OR INTOLERANCE:  1.  PENICILLIN.  2.  SULFA.  3.  CODEINE.  4.  MYCINS.   PROCEDURES:  1.  Cardiac catheterization.  2.  Coronary arteriogram.  3.  Left ventriculogram.   HOSPITAL COURSE:  [name] is a 40 year old female with a history of chest  pain, but multiple cardiac risk factors for coronary artery disease.  She  had a cath in January of 2006 that showed normal coronaries.  She reported a  history of chest pain for about two weeks prior to admission that was  somewhat worse with exertion.  She was seen by Dr. Andee Lineman and had a coronary  CT, but there was a mid LAD haziness, whose etiology and significance was  unclear.   Because of the LAD haziness, she was transferred from [facility] to  [facility] for further evaluation of catheterization.   The catheterization showed a left dominant system.  She had minimal  irregularities in the origin of the OM2, but otherwise clean  coronaries.  Her left ventriculogram was normal.   The next day her post procedure labs were within normal limits.  It was  suspected that she had reflux symptoms responsible for her chest pain and  she was switched from Zantac to Protonix.  She was ambulating without chest  pain or shortness of breath and considered stable for discharge on September 07, 2005 with outpatient follow up arranged.   TIME AT DISCHARGE:  Thirty-eight minutes.   DISCHARGE INSTRUCTIONS:  1.  Her activity level is to be decreased with no driving over the next two      days and no heavy lifting for a week.  2.  She is to stick to a low-fat diabetic diet.  3.  She is to call our office for problems with the cath site.   DISCHARGE MEDICATIONS:  1.  Aspirin 81 mg a day.  2.  Toprol-XL 50 mg 1/2 tab daily.  3.  Lisinopril 5 mg a day.  4.  Zocor 20 mg a day.  5.  Celexa 40 mg a day.  6.  Multivitamin daily.  7.  Protonix 40 mg a day.  8.  She is not to take Zantac any more.      Theodore Demark, P.A. [facility]      Arvilla Meres, M.D. [facility]  Electronically Signed    RB/MEDQ  D:  09/07/2005  T:  09/07/2005  Job:  [id number]   cc:   Jae Dire, P.A. [facility]   Patrica Duel, M.D.  Fax: [phone]

## 2010-12-18 NOTE — Discharge Summary (Signed)
NAMEJESSEY, Mckenzie                ACCOUNT NO.:  [id number]   MEDICAL RECORD NO.:  [id number]          PATIENT TYPE:  INP   LOCATION:  3707                         FACILITY:  [facility]   PHYSICIAN:  Chales Salmon. Abigail Miyamoto, M.D.DATE OF BIRTH:  19-Nov-1970   DATE OF ADMISSION:  08/08/2004  DATE OF DISCHARGE:  08/10/2004                                 DISCHARGE SUMMARY   PROCEDURES:  1.  Cardiac catheterization.  2.  Coronary arteriogram.  3.  Left ventriculogram.   HISTORY OF PRESENT ILLNESS:  [name] is a 40 year old female no known  history of coronary artery disease.  She had chest pain in March of 2005  with a normal echocardiogram and a normal Cardiolite.  She developed chest  tightness and shortness of breath with some associated nausea and was having  symptoms for almost a week off and on.  She was admitted to [facility] for further evaluation and treatment.   HOSPITAL COURSE:  Her enzymes were negative for MI and it was felt that a  cardiac catheterization was indicated to further evaluate her anatomy.  The  cardiac catheterization was performed on August 10, 2004.   The cardiac catheterization showed no significant coronary artery disease  and an EF of approximately 50% with a transient left bundle branch block  that was catheter induced.  Medical therapy was recommended.   In addition, as part of her evaluation, she had a CT of her chest performed.  The CT of the chest showed no evidence of pulmonary embolism and no  mediastinal and hilar adenopathy.  She had a 4 mm noncalcified nodule in the  right lower lobe and followup limited CT is suggested in four to six months.  Her esophagus was somewhat thick walled distally and there was a question of  reflux esophagitis.  She was started on Protonix.   Post catheterization, her groin was without ecchymosis, hematoma or oozing.  Her symptoms had resolved.  Her QTC was borderline at 0.471, but this did  not change over the  course of her hospital stay.  Additionally, there was  concern for a hypercoagulable state and those laboratories have been drawn.  They are pending at the time of dictation.   [name] was ambulating without chest pain or shortness of breath and  considered stable for discharge on August 10, 2004.   LABORATORY VALUES:  Antithrombin 3 was 95, which is within normal limits.  Homocystine 6.69, also within normal limits.  Hemoglobin A1C 8.2.  Factor V  Leiden, PTG202 108A mutation, lupus anticoagulation, protein S and protein C  function and lipid profile are pending at the time of dictation.   DISCHARGE DIAGNOSES:  1.  Chest pain.  No pulmonary embolus by chest CT and no coronary artery      disease by catheterization.  2.  Noncalcified 4 mm nodule in the right lower lobe.  Followup limited CT      in four to six months.  3.  Diabetes.  On insulin pump.  A1C 8.2.  4.  History of migraines.  5.  History of anxiety and depression.  6.  Family history of coronary artery disease.  7.  History of allergies/intolerance to PENICILLIN and SULFA.  8.  History of endometriosis.  9.  Status post cholecystectomy and appendectomy.  10. History of tobacco use.   DISCHARGE INSTRUCTIONS:  1.  Her activity level is to include no strenuous activity for two days.  2.  She is to call the office for problems with the catheterization site.  3.  She is to stick to a low-fat, diabetic diet.  4.  She is to follow up with Dr. Nobie Putnam as needed and has a P.A.      appointment for Dr. Dietrich Pates on August 26, 2004, at 1 p.m.   DISCHARGE MEDICATIONS:  1.  Protonix 40 mg a day.  2.  Insulin pump as prior to admission.  3.  Multivitamins daily.  4.  Topamax 100 mg daily.  5.  Elavil 25 mg daily.  6.  Celexa 40 mg daily.  7.  Estrace 2 mg one-and-a-half tablets daily.       RB/MEDQ  D:  08/10/2004  T:  08/10/2004  Job:  [id number]   cc:   Patrica Duel, M.D.  [address]  [place]   [place] [address]  Fax: [phone]   Mayhill Bing, M.D.

## 2010-12-18 NOTE — Discharge Summary (Signed)
[name], [name]                ACCOUNT NO.:  [id number]   MEDICAL RECORD NO.:  [id number]          PATIENT TYPE:  INP   LOCATION:  A304                          FACILITY:  [facility]   PHYSICIAN:  Patrica Duel, M.D.    DATE OF BIRTH:  [birth year]   DATE OF ADMISSION:  04/20/2005  DATE OF DISCHARGE:  09/20/2006LH                                 DISCHARGE SUMMARY   DISCHARGE DIAGNOSES:  1.  Acute gastroenteritis, resolving.  2.  Juvenile onset diabetes mellitus, insulin pump maintenance.   PAST MEDICAL HISTORY:  1.  Past surgery:  Post hysterectomy cholecystectomy/appendectomy.  2.  Hepatic hemangioma.  3.  History of chronic gastroesophageal reflux.  4.  Migraine headaches, well controlled.   HISTORY OF PRESENT ILLNESS:  For details regarding admission, please refer  to the admitting note.  Briefly, this 40 year old female with the above  history presented to the office with a three day history of unrelenting  nausea and vomiting.  Her exam was relatively benign, but her blood sugar  was approximately 400.  She was sent to the emergency department for further  evaluation, IV fluids, etc.   In the emergency room, her hemogram and chemistries were essentially normal.  Liver profile and physical examination were benign as well.  She continued  to experience nausea and vomiting despite Zofran and was admitted for  hydration and further evaluation therapy as indicated.   HOSPITAL COURSE:  The patient was treated routinely with IV fluids, sliding  scale insulin and Zofran.  Her symptoms have resolved.  She did have several  diarrheal stools which have resolved as well.   Currently, the patient is stable, alert and tolerating p.o.'s and requesting  discharge.  Her blood sugar this morning was 180.  Hemogram normal except  for mild decrease in her granulocytes, probably compatible with viral  syndrome.  Liver profile and other chemistries are normal.   DISPOSITION:  She is to continue  her home medications which include the  insulin pump which has been well adjusted.   MEDICATIONS:  1.  Amitriptyline 25-50 daily.  2.  Celexa 40 q.h.s.  3.  Topamax 100 q.h.s.  4.  Phenergan 25 p.r.n. nausea and vomiting.  5.  Estradiol 1.5 daily.   ALLERGIES:  PENICILLIN AND SULFA.   PLAN:  She will be treated and followed as an outpatient.      Patrica Duel, M.D.  Electronically Signed     MC/MEDQ  D:  04/21/2005  T:  04/21/2005  Job:  [id number]

## 2010-12-18 NOTE — Cardiovascular Report (Signed)
NAMEELLEEN, [name] NO.:  [id number]   MEDICAL RECORD NO.:  [id number]          PATIENT TYPE:  INP   LOCATION:  3707                         FACILITY:  [facility]   PHYSICIAN:  Vida Roller, M.D.   DATE OF BIRTH:  05/01/1971   DATE OF PROCEDURE:  08/10/2004  DATE OF DISCHARGE:                              CARDIAC CATHETERIZATION   PRIMARY CARE PHYSICIAN:  Patrica Duel, M.D.   PROCEDURES PERFORMED:  1.  Left heart catheterization.  2.  Selective coronary angiography.  3.  Left ventriculography, all via the right femoral artery.   HISTORY OF PRESENT ILLNESS:  [name] is a 40 year old Caucasian woman with  insulin-dependent diabetes since the age of 62 who presents with atypical  chest discomfort.  She underwent a CT angiography of the pulmonary arteries  to insure that she did not have a pulmonary embolus.  This was negative, and  then she was brought to the cardiac catheterization laboratory.   DESCRIPTION OF PROCEDURE:  After obtaining informed consent, the patient was  brought to the cardiac catheterization laboratory in a fasting state.  There  she was prepped and draped in the usual sterile manner and local anesthetic  was obtained over the right groin using 1% lidocaine without epinephrine.  The right femoral artery was cannulated using the modified Seldinger  technique with a 6-French 10 cm sheath and left heart catheterization was  performed using a 6-French Judkins left #4, a 6-French Judkins right #4 and  a 6-French pigtail catheter.  At the conclusion of the procedure the  catheters were removed and the patient was moved back to the cardiology  holding area where the femoral artery sheath was removed, hemostasis was  obtained using direct manual pressure.  At the conclusion of the hold there  was no evidence of ecchymosis or hematoma formation.  Distal pulses were  intact.  Total fluoroscopic time 2.8 minutes.  Total iodinized contrast 100  cc.   RESULTS:  1.  The aortic pressure was measured at 113/69 with a mean arterial pressure      of 88 mm/M[place]ury.  2.  Left ventricular pressure 113/15 with an end diastolic pressure of 20      mm/M[place].  Please note that when the pigtail catheter was passed      across the aortic valve into the left ventricle, the patient had a      transient period of left bundle branch block.  This resolved      spontaneously once the catheter was positioned appropriately in the mid      ventricle.   CORONARY ANGIOGRAPHY:  1.  The left main coronary artery is a large vessel which is      angiographically normal.  2.  The left anterior descending coronary artery is a large transapical      vessel with two diagonal branches and is angiographically normal.  3.  The left circumflex coronary artery is a large dominant vessel with a      moderate caliber posterior descending coronary artery and several large      obtuse marginals  and it is angiographically normal.  4.  The right coronary artery is a small nondominant vessel which is      angiographically normal.   LEFT VENTRICULOGRAPHY:  The left ventriculogram reveals normal size  ventricle with an ejection fraction of about 50%.  There is some distal  anterior hypokinesis, likely secondary to the transient left bundle branch  block.  There was no mitral regurgitation seen.   ASSESSMENT:  1.  Angiographically normal left dominant coronaries.  2.  Transient left bundle branch block likely catheter-induced.  3.  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   PLAN:  Medical therapy.  We will discharge this woman after her bed rest is  completed to home for further evaluation.      Trey Paula   JH/MEDQ  D:  08/10/2004  T:  08/10/2004  Job:  [id number]   cc:   Patrica Duel, M.D.  [address]  [place]  [place] [address]  Fax: [phone]

## 2010-12-18 NOTE — Discharge Summary (Signed)
NAMEVIVIKA, [name]                          ACCOUNT NO.:  [id number]   MEDICAL RECORD NO.:  [id number]                   PATIENT TYPE:  INP   LOCATION:  A217                                 FACILITY:  [facility]   PHYSICIAN:  Patrica Duel, M.D.                 DATE OF BIRTH:  1970/08/19   DATE OF ADMISSION:  10/29/2003  DATE OF DISCHARGE:  10/31/2003                                 DISCHARGE SUMMARY   DISCHARGE DIAGNOSES:  1. Noncardiac chest pain.  2. Juvenile-onset diabetes mellitus, well-controlled with insulin pump.  3. Anxiety and depression, well-controlled.  4. Status post cholecystectomy.   HISTORY OF PRESENT ILLNESS:  For details regarding admission, please refer  to the admitting note.  Briefly, this 40 year old female with the above  history presented to the emergency department with a several week history of  increasingly severe and reproducible substernal chest pain primarily with  the left anterior.  She did have some radiation of the pain to the left  shoulder and back.  She had associated dyspnea and occasional diaphoretic  episodes.  She had no palpitations or syncopal episodes.  Workup in the  emergency department was benign.  D-dimer was normal.  Cardiac enzymes were  normal with nonacute EKG.  She was admitted for thorough cardiac evaluation  to rule out premature coronary artery disease.  She does have a family  history as well as diabetes.   HOSPITAL COURSE:  The patient did well in the hospital where she was seen by  cardiology.  Cardiolite stress was obtained which was negative for ischemia.  She was stable for discharge on the following day.   DISCHARGE MEDICATIONS:  1. Wellbutrin 150 mg q.d.  2. Clonazepam 0.5 mg q.i.d. p.r.n.  3. Celexa 20 mg q.d.  4. Novolog insulin pump.  5. Topamax 100 mg b.i.d.  6. Hyoscyamine 0.125 mg q.d.  7. Protonix 40 mg b.i.d.  8. Naprosyn 375 mg q.d.   FOLLOW UP:  She will be followed and treated expectantly as an  outpatient.     ___________________________________________                                         Patrica Duel, M.D.   MC/MEDQ  D:  11/14/2003  T:  11/14/2003  Job:  [id number]

## 2010-12-18 NOTE — H&P (Signed)
[name], [name]                ACCOUNT NO.:  [id number]   MEDICAL RECORD NO.:  [id number]          PATIENT TYPE:  INP   LOCATION:  A304                          FACILITY:  [facility]   PHYSICIAN:  Patrica Duel, M.D.    DATE OF BIRTH:  November 05, 1970   DATE OF ADMISSION:  04/20/2005  DATE OF DISCHARGE:  LH                                HISTORY & PHYSICAL   CHIEF COMPLAINT:  Nausea, vomiting, and weakness.   HISTORY OF PRESENT ILLNESS:  This is a 40 year old female with history of  juvenile onset diabetes since age 11.  She has been maintained very well with  an insulin pump for 2 years.  She is also status post hysterectomy,  cholecystectomy, and appendectomy.  She has a documented hepatic hemangioma  per the patient.  She is  treated for chronic gastroesophageal reflux.   The patient also has migraine headaches which have been well controlled on  medications noted below.   The patient presented to the office today with a 3-day history of  unrelenting nausea and vomiting.  Her exam was relatively benign, but she  was sent to the emergency department due to blood sugar of approximately 400  by our machine.   In the emergency department, the patient was given IV fluids as well as  Zofran with some response, though she continued to experience nausea.  Laboratory review revealed normal acid base status as well as normal  hemogram.  Chemistries were unremarkable except for mildly depressed sodium  of 134, potassium 3.6, glucose 222.  Notably, lipase was normal.   Due to the patient's inability to tolerate p.o.'s and ongoing nausea, she is  admitted for management of probable gastroenteritis, though other entities  will be considered depending on her response to therapy.   There is no history of headache or head trauma, melena, hematemesis,  hematochezia, or significant abdominal pain.  She has developed diarrhea  within the past several hours.  No history of chest pain, shortness of  breath, palpitations, syncope, or genitourinary symptoms.  Neurologic status  is normal.   CURRENT MEDICATIONS:  1.  Amitriptyline 25 to 50 mg daily.  2.  Celexa 40 mg nightly.  3.  Topamax 100 nightly.  4.  Phenergan 25 p.r.n.  5.  Estradiol 1.5 daily.  6.  Insulin pump as noted above.   ALLERGIES:  PENICILLIN and SULFA.   PAST MEDICAL HISTORY:  Reviewed above.   SOCIAL HISTORY:  No history of alcohol or illicit drug use.  She is a  nonsmoker and has a supportive family.   REVIEW OF SYSTEMS:  Negative except as mentioned.   FAMILY HISTORY:  Noncontributory.   PHYSICAL EXAMINATION:  GENERAL:  This is a very pleasant, fully alert female  who currently appears to be in no distress. She is well hydrated with moist  mucous membranes.  VITAL SIGNS: Temperature 97.9, blood pressure 120/70, pulse 74, respirations  20, O2 saturation 96%.  HEENT: Normocephalic and atraumatic.  Pupils equal.  Ears, nose, throat  benign.  NECK:  Supple.  There are no bruits, thyromegaly, or  lymphadenopathy noted.  LUNGS: Clear.  HEART:  Heart sounds are normal without murmurs, rubs, or gallops.  ABDOMEN: Essentially nontender without masses or organomegaly.  Bowel sounds  are hyperactive.  EXTREMITIES:  No clubbing, cyanosis, or edema.  NEUROLOGIC:  Totally benign.   ASSESSMENT:  Probable gastroenteritis in insulin-dependent diabetic.  Currently acid base and hydration status is adequate, though she is high  risk for complications.   PLAN:  Admit for close observation, symptomatic therapy, and IV fluids.  Will manage her blood sugar expectantly.      Patrica Duel, M.D.  Electronically Signed     MC/MEDQ  D:  04/20/2005  T:  04/20/2005  Job:  [id number]

## 2010-12-18 NOTE — H&P (Signed)
[name], Mckenzie NO.:  [id number]   MEDICAL RECORD NO.:  [id number]          PATIENT TYPE:  INP   LOCATION:  3707                         FACILITY:  [facility]   PHYSICIAN:  Arvilla Meres, M.D. [facility] OF BIRTH:  August 25, 1970   DATE OF ADMISSION:  08/08/2004  DATE OF DISCHARGE:                                HISTORY & PHYSICAL   PRIMARY CARE PHYSICIAN:  Patrica Duel, M.D., [place], [place].   CARDIOLOGIST:  Ladd Bing, M.D.   HISTORY OF PRESENT ILLNESS:  [name] is a very pleasant 40 year old woman  with a history of juvenile onset diabetes (about age 85), family history of  premature CAD who was transferred from [facility] for further  evaluation of a five-day history of chest discomfort and shortness of  breath.   She denies personal history of known CAD. She did have an episode of chest  pain in March 2005 during which she had a normal echo and Cardiolite as well  as a normal chest CT with Dr. Dietrich Pates. She was deemed to have  musculoskeletal pain. Since that time she has been doing reasonably well  except for an increased level of emotional stress over her marital  situation. This Tuesday she developed tightness in her chest with difficulty  getting deep breath. Also associated with nausea, but no diaphoresis or  radiation. The discomfort and shortness of breath progressed during the  week, so she finally went to her doctor and was admitted to [facility]. She  does note that there is mild discomfort with exertion, but there is no  relationship to eating. Essentially the pain has been constant, but does wax  and wane. She has not had any response to nitroglycerin. She has not had any  CHF symptoms. She denies any cough or fevers. She has not noticed any  asymmetric leg swelling. She denies any recent long car or airplane rides.   Dr. Domingo Sep of [facility] was contacted for admission and the  patient was accepted. However, once  the patient was arrived she was seen by  Dr. Domingo Sep and then it was realized that she was followed by Dr. Dietrich Pates,  so we were called. While I was interviewing the patient she was having 8/10  discomfort, although looked reasonably comfortable.   PAST MEDICAL HISTORY:  1.  Chest pain, presumed noncardiac in March 2005.      1.  Echocardiogram in March 2005 showed normal EF with evidence of          bileaflet mitral valve prolapse. There was trivial MR and TR.      2.  Cardiolite in March 2005 was negative. Also previous chest CT was          negative for PE.  2.  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 Managed on insulin      pump.      1.  Recent hemoglobin A1C is 9.  3.  Migraines.  4.  Anxiety/depression.  5.  Strong family history for premature CAD.  6.  Endometriosis, status post TAH at age 40.  7.  Status post cholecystectomy and appendectomy.   CURRENT MEDICATIONS:  1.  Multivitamin one tablet a day.  2.  Topamax 100 mg daily.  3.  Elavil 25 mg daily.  4.  Celexa 40 mg daily.  5.  Estrace 3 mg daily.  6.  Insulin pump.   ALLERGIES:  She is allergic to SULFA and PENICILLIN with a rash.   SOCIAL HISTORY:  She lives in [place] with her second husband. She has  grown kids. She smokes one pack of tobacco per day times five months, but  quit one month ago. She has social alcohol use. She works as a Lawyer.   FAMILY HISTORY:  Mother died at age 40. She had a history of two MIs; first  at age 40 as well as a CVA. She died as a result of a hemorrhage after  receiving thrombolytics for a CVA. Father is 88 and okay. She has one  sibling, a sister, who is age 32. 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   PHYSICAL EXAMINATION:  GENERAL: She is comfortable, appears somewhat  depressed.  VITAL SIGNS: Temperature 98.6, blood pressure 99/66, heart rate 85. She is  saturating 99% on two liters.  HEENT: Sclerae anicteric. EOMI.  NECK:  Supple. There is no JVD. Carotids are 2+ bilaterally without any  bruits. There is no lymphadenopathy or thyromegaly.  CARDIOVASCULAR: Regular rate and rhythm with no murmurs, rubs, or gallops.  There is no pain on palpation of her chest wall.  LUNGS: Clear to auscultation.  ABDOMEN: Obese, soft, nontender, nondistended. There are no bruises, masses,  or hepatosplenomegaly.  EXTREMITIES: Warm. There is no cyanosis, clubbing, or edema. There are no  cords.  NEUROLOGIC: She is alert and oriented times three. Otherwise, nonfocal.   LABORATORY DATA:  White count 7.9, hematocrit 40.4, platelet count 285,000.  Sodium 138, potassium 4.3, chloride 106, bicarbonate 30, BUN 8, creatinine  0.7, and glucose 98. Cardiac enzymes are negative. D-dimer is 0.23 which is  within normal range.   ECG on August 08, 2004, shows normal sinus rhythm with rate of 77 with mild  T-wave inversions in V1 through V3, and this is worse than January 6th. QT  is slightly prolonged.   </ASSESSMENT>  [name] is admitted with chest pain and shortness of breath, both typical  and atypical features. She has now had five days of significant symptoms  with negative cardiac markers. However, she does have a long history of  diabetes and a strong family history. I discussed the risks and benefits of  catheterization versus repeat stress testing and she would very much like to  pursue cardiac catheterization.   PLAN:  1.  CT of the chest to rule out pulmonary embolus. If this is negative plan      for cardiac catheterization on Monday.  2.  As her blood pressure is marginal we will not initiate a beta blocker      and ACE inhibitor for now. Given her diabetes she would likely benefit      from an ACE inhibitor if she can tolerate it.  3.  Pain control.  4.  Heparin.  5.  Will need continued treatment of her depression and anxiety as I suspect     that this is playing a large part in her      symptoms.  6.  Watch QT  interval; if this is increasing need to consider DC Elavil.  7.  Consider  hypercoagulable workup.      Dani   DB/MEDQ  D:  08/08/2004  T:  08/08/2004  Job:  [id number]   cc:   Patrica Duel, M.D.  [address], [address]  [place]  [place] [address]  Fax: [phone]   [place] Bing, M.D.

## 2010-12-18 NOTE — Consult Note (Signed)
NAME:  [name], [name]                          ACCOUNT NO.:  [id number]   MEDICAL RECORD NO.:  [id number]                   PATIENT TYPE:  INP   LOCATION:  A217                                 FACILITY:  [facility]   PHYSICIAN:  [place] Bing, M.D.               DATE OF BIRTH:  [birth year]   DATE OF CONSULTATION:  10/30/2003  DATE OF DISCHARGE:                                   CONSULTATION   CARDIOLOGY CONSULTATION:   REFERRING PHYSICIAN:  Patrica Duel, M.D.   ENDOCRINOLOGIST:  Leonie Man, M.D.   HISTORY OF PRESENT ILLNESS:  [age]-year-old woman without known  coronary disease admitted to hospital for chest pain.  [name] has type 1  diabetes whose onset was noted at age 21.  She is currently being treated  with a wearable insulin pump.  She has not had hypertension and has not been  told of hyperlipidemia.  She is postmenopausal having undergone a TAH/BSO 9  years ago for endometriosis.  There is a family history of coronary disease  - her mother suffered both myocardial infarction and CVA before the age of  9.   Symptoms have occurred approximately over the past 3 weeks; prior to that,  there was no prominent history of chest discomfort or other cardiopulmonary  symptoms.  She has noted episodic sharp left chest discomfort of moderate  severity that radiates through to the back and sometimes vaguely to the left  shoulder.  There is a pleuritic component to this discomfort as well as some  associated dyspnea.  There is no relationship to exertion or body position.  There is no chest wall tenderness.  There has been some associated nausea  and diaphoresis.  Pain typically occurs several times per day and lasts a  few minutes.  She has taken aspirin with some relief.  She came to the  emergency department for a more severe episode and was admitted to hospital.  Initial EKGs and cardiac markers have been negative.  Treatment with  sublingual nitroglycerin has not been  effective.  She feels fairly well at  the present time and has not had discomfort for a number of hours.   PAST MEDICAL HISTORY:  Otherwise notable for a prior C-section,  cholecystectomy, appendectomy and excisional breast biopsy for benign  disease on two occasions.  There is history of anxiety/depression.   ALLERGIES:  PENICILLIN, SULFA, and CODEINE.   MEDICATIONS PRIOR TO ADMISSION INCLUDED:  Insulin, Wellbutrin 150 mg once  daily, Celexa 20 mg once daily, clonazepam 0.5 mg once daily, hyoscyamine  0.125 mg once daily, Topamax 100 mg b.i.d.   SOCIAL HISTORY:  Lives in [place] with her second husband.  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  Has one son and one stepchild.  Considerable stress of late related to son, prior husband and other issues  with family members.   No history  of tobacco abuse.  No history of excessive alcohol.   FAMILY HISTORY:  Mother died at age 56 having suffered from a CVA and MI.  Father is age 61 and well.  One sister at age 55 has a history of  hypertension and DVT.   REVIEW OF SYSTEMS:  Intermittent depression and anxiety; she experiences  myalgias of her legs; she has had minimal rectal bleeding in the past.  All  other systems reviewed and are negative.   PHYSICAL EXAMINATION:  GENERAL:  Pleasant well-appearing young woman.  VITAL SIGNS:  The temperature is 97, heart rate 78 regular, respirations 16,  blood pressure 90/50, weight 203.  HEENT:  Anicteric sclerae.  Pupils equal, round, and react to light.  NECK:  No jugular venous distention; no carotid bruits.  ENDOCRINE:  No thyromegaly.  HEMATOPOIETIC:  No adenopathy.  SKIN:  No significant lesions.  LUNGS:  Clear.  CARDIAC:  Normal first and second heart sounds; minimal systolic ejection  murmur.  ABDOMEN:  Soft and nontender; no organomegaly.  EXTREMITIES:  Normal distal pulses; no edema.  NEUROMUSCULAR:  Symmetric strength and tone.  MUSCULOSKELETAL:  No joint  deformity.   CHEST X-RAY:  Normal.   ELECTROCARDIOGRAM:  Normal sinus rhythm; left atrial abnormality; right  ventricular conduction delay; minimal nonspecific T wave abnormality.   OTHER LABORATORY STUDIES:  Unremarkable including D-dimer.   IMPRESSION:  [name] presents with atypical chest discomfort that is most  consistent with a musculoskeletal etiology.  Unfortunately, despite her  young age, she has significant cardiovascular risk factors most notably  longstanding diabetes, surgical menopause 9 years ago and a positive family  history.  A stress Cardiolite study will be performed to exclude significant  coronary disease.  Pericarditis is also a possibility - an echocardiogram  has been requested.  Empiric treatment with a nonsteroidal drug for a short  course will be initiated.  If testing is negative and symptoms improve, she  likely can be discharged in the morning.   Even if she does not have symptomatic coronary disease at the present time,  diabetes serves as a coronary equivalent.  A lipid profile will be obtained  with appropriate therapy initiated based upon the results.      ___________________________________________                                            [place] Bing, M.D.   RR/MEDQ  D:  10/30/2003  T:  10/31/2003  Job:  [id number]

## 2010-12-18 NOTE — Op Note (Signed)
[name], [name]                ACCOUNT NO.:  [id number]   MEDICAL RECORD NO.:  [id number]          PATIENT TYPE:  AMB   LOCATION:  DAY                           FACILITY:  [facility]   PHYSICIAN:  Lionel December, M.D.    DATE OF BIRTH:  06/12/1971   DATE OF PROCEDURE:  07/06/2005  DATE OF DISCHARGE:                                 OPERATIVE REPORT   PROCEDURE:  Colonoscopy.   INDICATION:  [name] is a 40 year old Caucasian female with recurrent upper  and lower abdominal pain as well as diarrhea. I felt that she has IBS;  however, she has not responded therapy. She is also having intermittent  hematochezia. She has had a few episodes where she passed a moderate large  amount of blood. She has not responded therapy for hemorrhoidal bleed.  Furthermore, family history is significant for Crohn's disease in her  father. She is undergoing colonoscopy to make sure she does not have  inflammatory bowel disease. Procedure and risks were reviewed with the  patient, and informed consent was obtained.   MEDICINES FOR CONSCIOUS SEDATION:  Demerol 50 mg IV, Versed 12 mg IV in  divided dose.   FINDINGS:  Procedure performed in endoscopy suite. The patient's vital signs  and O2 saturation were monitored during the procedure and remained stable.  The patient was placed in left lateral position. Rectal examination  performed. No abnormality noted on external or digital exam. Olympus  videoscope was placed in rectum and advanced under vision into sigmoid colon  and beyond. Preparation was excellent except she had some stool in her right  colon. Scope was passed into cecum. Ileocecal valve and appendiceal orifice  were well seen in pictures taken for the record. Only short segment of TI  could be seen and was normal. As the scope was withdrawn, colonic mucosa was  carefully examined and revealed normal vascular pattern throughout. Rectal  mucosa was normal. Scope was retroflexed to examine anorectal  junction, and  small hemorrhoids were noted below the dentate line. Endoscope was  straightened and withdrawn. The patient tolerated the procedure well.   FINAL DIAGNOSIS:  Normal colonoscopy except external hemorrhoids. Only very  short segment of TI could be examined.   RECOMMENDATIONS:  We will bring her back for small bowel follow-through.   As far as patient's hepatic lesions are concerned, I have reviewed all of  the studies. What we see on ultrasound is not seen on CT. CT findings are  clearly benign, but she will return for a follow-up ultrasound in March to  document the stability of these areas.     Lionel December, M.D.  Electronically Signed    NR/MEDQ  D:  07/06/2005  T:  07/06/2005  Job:  [id number]

## 2010-12-18 NOTE — Consult Note (Signed)
NAMEJANAIYAH, [name]                ACCOUNT NO.:  [id number]   MEDICAL RECORD NO.:  [id number]          PATIENT TYPE:  AMB   LOCATION:  DAY                           FACILITY:  [facility]   PHYSICIAN:  Lionel December, M.D.    DATE OF BIRTH:  02/20/1971   DATE OF CONSULTATION:  DATE OF DISCHARGE:                                   CONSULTATION   CONSULTING PHYSICIAN:  Lionel December, M.D.   REFERRING PHYSICIAN:  This is a self referral.   PRIMARY CARE PHYSICIAN:  Corrie Mckusick, M.D. with [facility].   CHIEF COMPLAINT:  Acid reflux, abnormal esophagus on CT.   HISTORY OF PRESENT ILLNESS:  [name] is a 40 year old lady who presents  today for further evaluation of abnormal esophagus seen on recent CT.  She  states that she developed chest tightness recently which cuts off my  breath.  The symptoms have been intermittent.  She often wakes up at night  with these symptoms and has to get up and walk around in order to be able to  breath and have relief.  She has occasional heartburn especially with  certain foods such as bananas.  She saw her primary care physician who  arranged for her to be admitted to the hospital for chest pain/tightness.  She actually underwent cardiac catheterization which revealed LV EF of 50%  and no significant coronary artery disease.  She had a normal echocardiogram  and Cardiolite in March 2005.  She had a CT chest angiogram which revealed a  4-mm right lower lobe noncalcified nodule and thickened wall of the  esophagus distally.  Because of this finding, she presents today for further  workup.  Complains of a 25 pound weight gain in the last two months.  We received labs from her hospitalization which revealed a normal CBC.  LFTs  normal except for albumin of 3 and AST of 42.  Her hemoglobin A1c was 8.2.  She had coagulation studies which revealed protein C elevated at 160.  Protein S was normal at 90.  Lupus anticoagulant was 173.3  which is  elevated.  Factor V Leiden mutation was negative.  Her PTT/PT were normal.  Antithrombin III levels were normal as well.   CURRENT MEDICATIONS:  1.  Topamax 100 mg b.i.d.  2.  Elavil 1-2 daily.  3.  Multivitamin every day.  4.  Stool softener every day.  5.  Estradiol 1 mg, one and a half tablets daily.  6.  Promethazine 25 mg p.r.n.  7.  Protonix 40 mg every day.  8.  Celexa 40 mg one and a half tablets daily.  9.  NovoLog 38.5 units every day.  She is on an insulin pump.   ALLERGIES:  1.  PENICILLIN.  2.  SULFA.   PAST MEDICAL HISTORY:  1.  Insulin-dependent-diabetes mellitus from age 85.  2.  She has migraine headaches.  3.  Anxiety and depression.  4.  Endometriosis.   PAST SURGICAL HISTORY:  1.  Cesarean section.  2.  Hysterectomy in 1995.  3.  Cholecystectomy, 1996.  4.  Appendectomy.  5.  Cyst removed from her right breast.   FAMILY HISTORY:  Mother died of an MI at age 41.  She had a sister who has a  history of DVT and pulmonary embolus and hypertension.  Negative for  colorectal cancer or chronic GI illnesses.   SOCIAL HISTORY:  She is married and has a 4 year old son.  She is employed  as a Geophysical data processor.  She is a nonsmoker.  Denies any alcohol use.   REVIEW OF SYSTEMS:  See HPI for GI and cardiopulmonary.  GENITOURINARY:  Denies any dysuria.   PHYSICAL EXAMINATION:  VITAL SIGNS:  Weight 205, height 5 foot 6 inches,  temp 97.7, blood pressure 128/80, pulse 88.  GENERAL:  A pleasant, well nourished, well developed, Caucasian female in no  acute distress.  She is accompanied by her aunt.  SKIN:  Warm and dry.  No jaundice.  HEENT:  Conjunctivae are pink.  Sclerae are nonicteric.  Oropharyngeal moist  and pink.  No lesions, erythema, or exudate.  No lymphadenopathy,  thyromegaly.  CHEST:  Lungs are clear to auscultation.  CARDIAC:  Reveals a regular rate and rhythm.  Normal S1 S2.  No murmurs,  rubs or gallops.  ABDOMEN:  Positive  bowel sounds.  Soft, nontender, nondistended.  No  organomegaly or masses.  EXTREMITIES:  No edema.   IMPRESSION:  1.  [name] is a 40 year old lady who presents today with a history of      chest tightness, negative cardiac pulmonary workup.  She was found to      have a thickened esophagus on chest CT.  She has occasional typical      reflux symptoms.  Her symptoms have not improved on Protonix 40 mg      daily, although she just recently started this medication.  She also      complains of dysphagia both to solid foods and to liquids, sometimes      strangles on liquids.  Given above findings, she needs to have an upper      endoscopy for further evaluation.  I discussed risks, alternatives, and      benefit and she is agreeable to proceed.  2.  Not mentioned above, she also complains of abdominal cramps with      alternating constipation and diarrhea.  Abdominal pain/cramps is      relieved with defecation.  She has noted this particularly over the last      several months.  I suspect she has irritable bowel syndrome.  3.  Mildly elevated  transaminase during a recent hospitalization.   PLAN:  1.  EGD +/- esophageal dilatation in the near future.  2.  Repeat LFTs and TSH.  3.  She was noted to have abnormal coagulation studies and I have asked her      to follow up with Dr. Phillips Odor regarding these results.  She may need to      see a hematologist.  4.  Trial of NuLev one sublingual q.i.d. p.r.n. abdominal cramps and      diarrhea, prescription given for 60 with one refill.  5.  Further recommendations to follow.      LL/MEDQ  D:  08/28/2004  T:  08/28/2004  Job:  [id number]   cc:   Corrie Mckusick, M.D.  Fax: [phone]

## 2010-12-18 NOTE — Assessment & Plan Note (Signed)
[facility]                          [facility] OFFICE NOTE   NAME:[name]                       MRN:          [id number]  DATE:10/14/2006                            DOB:          [birth year]    PRIMARY CARE PHYSICIAN:  Dr. Sharlot Gowda at [facility].   CARDIOLOGIST:  Dr. Lewayne Bunting.   HISTORY OF PRESENT ILLNESS:  [name] is a very pleasant 40 year old  female patient with a history of diabetes mellitus type 1,  hyperlipidemia, hypertension, gastroesophageal reflux disease, as well  as diabetic neuropathy and gastroparesis who present to the office today  for post hospitalization followup.  She underwent cardiac  catheterization in February 2007 after undergoing cardiac CT which  showed some haziness in the LAD distribution.  Cardiac catheterization  at that time was normal.  Her EF was 55% - 60%.  She presented to  [facility] on October 09, 2006 with recurrent chest pain.  She  ruled out for myocardial infarction and underwent stress nuclear study.  This was suggestive of ischemia in the distribution of the LAD.  There  was a small to moderate sized anterior defect.  EF was 60%.  Followup  was arranged today so she could be set up for further testing.  In the  office she notes that she has been having substernal chest discomfort  described as a tightness or pressure.  This has been ongoing for a  couple of weeks, it comes on at rest.  She really denies any exertional  symptoms.  It radiates to her left arm, it is associated with shortness  of breath.  She feels tired.  She also has nausea and diaphoresis.  She  does sometimes note some pain with deep breathing but denies any pain  with positional changes.  She says that the pain lasts for about 15-20  minutes.  The pain usually goes away on its own.   PAST MEDICAL HISTORY:  1. Diabetes mellitus type 1.  2. Hypertension.  3. Hyperlipidemia, untreated.      a.     During  recent hospitalization LDL was 140, HDL 37, total       cholesterol 213, triglycerides 180.  4. Diabetic neuropathy.  5. Diabetic gastroparesis.  6. History of total abdominal hysterectomy and bilateral salpingo-      oophorectomy.  7. Cardiac catheterization February 2007 as noted above.  8. Good LV function.  9. Gastroesophageal reflux disease.   CURRENT MEDICATIONS:  1. Humalog insulin.  2. Lantus 17 units b.i.d.  3. Elavil 25 mg two tablets daily.  4. Lyrica 150 mg b.i.d.  5. Reglan 10 mg four times a day.  6. Metoprolol 25 mg b.i.d.  7. Plavix 75 mg a day.  8. Estradiol 1 mg daily.  9. Zantac 150 mg daily.  10.Aspirin 81 mg daily.  11.Xanax p.r.n.   ALLERGIES:  PENICILLIN AND SULFA.   SOCIAL HISTORY:  She admits to smoking cigarettes.  She smokes 1 pack  per day for last 3 years.  She denies alcohol abuse.  She is married.  She  has one son who is 42 years old.  She is disabled secondary to  diabetes.   FAMILY HISTORY:  Significant for coronary artery disease.  Her mother  died at age 32 of myocardial infarction.  Both maternal grandparents had  myocardial infarctions in their 43s.   REVIEW OF SYSTEMS:  Please see HPI.  Denies any cough or hemoptysis.  Denies any hematemesis.  She does note some dysphagia at times with  certain foods.  She denies any odynophagia.  She denies any relation of  pain to her meals.  She denies any waterbrash symptoms.  She denies any  significant belching.  The rest of the review of systems are negative.   PHYSICAL EXAMINATION:  She is a well-nourished, well-developed female in  no distress.  Blood pressure 108/72, pulse 71, weight 189 pounds.  HEAD:  Normocephalic/atraumatic.  EYES:  PERRLA, sclerae clear.  NECK:  Without JVD.  LYMPH:  Without lymphadenopathy.  CAROTIDS:  Without bruit bilaterally.  CARDIO:  S1, S2, regular rate and rhythm without murmurs,  LUNGS:  Clear to auscultation bilaterally without wheezing rhonchi or   rales.  ABDOMEN:  Soft, nontender, with normoactive bowel sounds, no  organomegaly.  EXTREMITIES:  Without edema, calves are soft, nontender.  SKIN:  Warm and dry.  NEUROLOGIC:  She is alert and oriented x3, cranial nerves II-XII grossly  intact.  ELECTROCARDIOGRAM:  Reveals sinus rhythm with a heart rate of 69, normal  axis, no acute changes.   IMPRESSION:  1. Chest pain.      a.     Positive Cardiolite study.  2. Normal coronaries by catheterization February 2007.  3. Good left ventricular function.  4. Diabetes mellitus type 1.  5. Hypertension.  6. Untreated dyslipidemia.  7. Gastroesophageal reflux disease.      a.     Diabetic gastroparesis.  8. Diabetic neuropathy.  9. Status post total abdominal hysterectomy/bilateral salpingo-      oophorectomy.  10.Family history of coronary artery disease.  11.Tobacco abuse.   PLAN:  The patient presents to the office today for post hospitalization  followup.  She continues to have chest pain at rest.  She has an  abnormal nuclear study suggestive of ischemia in the LAD distribution.  However, she does have a history of cardiac catheterization 1 year ago  that showed normal coronary arteries.  She has a history of normal LV  function.  The patient was also interviewed and examined by Dr. Andee Lineman.  We feel she should be further evaluated with outpatient cardiac  catheterization.  Risks and benefits have been explained to the patient,  she agrees to proceed.  This will be done in [place] at her  convenience sometime in the next several days.  Given the patient's  history of negative cardiac catheterization it is likely that her pain  is probably from a GI source.  We have elected to stop her Zantac and  place her on Prilosec 20 mg twice a day.  We have also given her Imdur  15 mg a day to cover for the possibility of coronary vasospasm.  We have also given her p.r.n. nitroglycerin.  She needs treatment of her lipids  given her  history of diabetes mellitus.  She has been placed on Zocor 20  mg daily.  She will need followup lipids and LFTs in the next 6-8 weeks.      Tereso Newcomer, PA-C  Electronically Signed      Learta Codding, MD,FACC  Electronically Signed   SW/MedQ  DD: 10/14/2006  DT: 10/15/2006  Job #: [id number]   cc:   Sharlot Gowda, MD

## 2010-12-18 NOTE — Procedures (Signed)
[name], [name]                          ACCOUNT NO.:  [id number]   MEDICAL RECORD NO.:  [id number]                   PATIENT TYPE:  INP   LOCATION:  A217                                 FACILITY:  [facility]   PHYSICIAN:  Vida Roller, M.D.                DATE OF BIRTH:  Aug 17, 1970   DATE OF PROCEDURE:  DATE OF DISCHARGE:  10/31/2003                                  ECHOCARDIOGRAM   TAPE NUMBER:  [id number], tape count [id number] 1882.   INDICATIONS:  This is a 40 year old woman with chest pain.  No previous  studies.   TECHNICAL QUALITY:  Adequate.   M-MODE TRACINGS:  1. The aorta is 29 mm.  2. The left atrium is 39 mm.  3. The septum is 10 mm.  4. Posterior wall is 10 mm.  5. The left ventricular diastolic dimension is 42 mm.  6. The left ventricular systolic dimension is 30 mm.   2-D AND DOPPLER IMAGING:  1. The left ventricle is normal size with normal systolic and diastolic     function.  Estimated ejection fraction is 55 to 60%.  No wall motion     abnormalities are seen.  2. The right ventricle is normal size with normal systolic function.  3. Both atria are top normal in size.  There is no obvious atrial septal     defect.  4. The aortic valve is trileaflet, tricommisure with no evidence of stenosis     or regurgitation.  5. 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  There is no stenosis.  There is trivial     insufficiency.  6. The tricuspid valve is morphologically unremarkable with trivial     insufficiency.  7. The pulmonic valve was not well seen but appears to have trivial     insufficiency.  8. The inferior vena cava is normal size.  9. The pericardial structures appear normal.  10.      Ascending aorta was not well seen.      ___________________________________________                                            Vida Roller, M.D.   JH/MEDQ  D:  10/31/2003  T:  10/31/2003  Job:  [id number]

## 2010-12-18 NOTE — H&P (Signed)
NAMEJOSELIN, CRANDELL NO.:  [id number]   MEDICAL RECORD NO.:  [id number]          PATIENT TYPE:  INP   LOCATION:  2926                         FACILITY:  [facility]   PHYSICIAN:  Verne Grain, MD   DATE OF BIRTH:  14-Feb-1971   DATE OF ADMISSION:  09/05/2005  DATE OF DISCHARGE:                                HISTORY & PHYSICAL   PRIMARY ENDOCRINOLOGIST:  Dr. Alen Bleacher ([facility]).   PRIMARY CARE PHYSICIAN:  Dr. Phillips Odor in [place].   PRIMARY CARDIOLOGIST:  Dr. Andee Lineman.  Most recently, Dr. Dietrich Pates in  consultation in the past.   CHIEF COMPLAINT:  Chest pain with recent coronary CTA by Dr. Andee Lineman with mid  LAD haziness of unclear significance.   RECOMMENDATIONS:  __________ transfer from [facility] to [facility] for cardiac catheterization to clarify LAD anatomy.   HISTORY OF PRESENT ILLNESS:  A 40 year old female with type 1 diabetes  mellitus, insulin dependent, on chronic insulin pump (original diagnosis at  40 years old).  Positive family history of coronary artery disease (mother  died at age 65 with a myocardial infarction, and also a stroke with  hemorrhagic complications from lytic therapy for a stroke), hyperlipidemia,  tobacco (quit last week), history of irritable bowel  syndrome/migraines/depression/anxiety/gastric reflux/status post  hysterectomy and hormone replacement therapy, history of non-cardiac chest  pain with cardiac catheterization in January of 2006 by Dr. Dorethea Clan revealing  normal coronary arteries.  Reports chest pain off and on for the past 2  weeks.  The pain is substernal in origin and radiates to the left arm.  It  is accompanied by nausea and diaphoresis; somewhat worse with exertion, but  not affected by palpation, position or respiration.  The patient was  evaluated by Dr. Andee Lineman with a coronary CTA, which was essentially normal,  other than a mid LAD haziness that could have been related to artifact, but  the  exact significance and etiology was unclear.  The patient continued to  experience chest discomfort that appeared to be getting somewhat worse  through the week, for which she was recommended to report to [facility]  emergency room for evaluation and subsequent transfer to [facility]  for a cardiac catheterization.  The patient reports that her pain was  approximately 10/10 around noon today before going to the emergency room.  She reports becoming pain free after administration of  nitroglycerin/morphine/Phenergan/aspirin/heparin.  She currently is pain  free.  Her EKG obtained a [facility] revealed no changes diagnostic  of ischemia.  CK and troponin there were negative x1.   ALLERGIES:  1.  PENICILLIN.  2.  SULFA.  3.  CODEINE.  4.  MYCINS.   CURRENT MEDICATIONS:  1.  Estrace 0.125 mg p.o. daily.  2.  Celexa 40 mg p.o. daily.  3.  Zocor 20 mg p.o. daily.  4.  Lisinopril 5 mg p.o. daily.  5.  Toprol-XL 50 mg p.o. daily.  6.  Multivitamin one tablet p.o. daily.  7.  NovoLog insulin pulp and sliding scale insulin.   PAST MEDICAL HISTORY:  1.  Insulin dependent type 1 diabetic (diagnosed at 40 years old), maintained      on a chronic insulin pump with NovoLog insulin).  2.  Tobacco use (quit last week).  3.  Positive family history of coronary artery disease (mother died at age      34 with a myocardial infarction).  4.  Hyperlipidemia.  5.  History of non-cardiac and atypical chest pain with negative CTA of the      pulmonary arteries in the past (no pulmonary emboli) and negative      adenosine Cardiolite (March 2005), and negative cardiac catheterization      blood Dr. Dorethea Clan with left dominant system, normal left main, normal      LAD, normal left circumflex, normal right coronary artery in January of      2006.  6.  History of depression/anxiety/irritable bowel syndrome/migraine      headaches with multiple presentations to the emergency room.  7.  Status  post hysterectomy/cholecystectomy/appendectomy.  8.  History of gastric reflux and small hiatal hernia status post EGD in      January of 2006.  Negative colonoscopy, other than hemorrhoids in      December 2006.  9.  History of hepatic hemangioma.   SOCIAL HISTORY:  The patient lives in [place] with her second husband.  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  She has no regular exercise, but is  active in the home environment doing housework.  She reports quitting  smoking last Friday.  She consumes alcohol only socially, and reports no  illicit drug use in her past.   FAMILY HISTORY:  The patient's mother died at age 43 with a myocardial  infarction.  She apparently had hemorrhagic complications of lytic therapy  for a stroke.  The patient's father is alive and healthy in his 58s.  The  patient's sister is in her 75s, alive, with a history of DVT and  hypertension.   REVIEW OF SYSTEMS:  Headaches, as described in the past.  Chest pain as  described in the HPI.  The patient reports she just hasn't felt normal x2  weeks, although she is unable to pinpoint why exactly it is, beyond the  chest discomfort that she has had, as to why she has not felt normal.  She  has had some nausea and diaphoresis with chest discomfort as described in  the HPI.  Review of systems is otherwise negative.  No recent fevers, chills  or weight change.  No acute alterations in auditory or visual acuity.  She  reports no acute rash, no presyncope, syncope, claudication, cough or  wheezing.  She has no bowel or bladder complaints beyond her usual status.  She has no polyuria or polydipsia.  No heat or cold intolerance.  No skin or  hair changes suggestive of endocrinologic abnormality.  All other systems  are negative.   PHYSICAL EXAMINATION:  VITAL SIGNS:  Temperature 97.4, heart rate 72,  respiratory rate 13, blood pressure 121/70, oxygen saturation 98% on room  air. GENERAL:  The patient is pleasant,  cooperative, alert and answers questions  appropriately.  HEENT:  She is normocephalic and atraumatic.  Extraocular eye movements are  intact.  Oropharynx is pink and moist without lesions.  NECK:  Supple.  There is no palpable lymphadenopathy.  No palpable  thyromegaly.  There are no carotid bruits ausculted.  There is no jugular  venous distention.  CARDIOVASCULAR:  A regular S1 and a  regular S2 with no murmurs.  LUNGS:  Clear to auscultation bilaterally.  SKIN:  Limited skin examination reveals no acute rash.  ABDOMEN:  Soft, nontender, nondistended with positive bowel sounds.  EXTREMITIES:  Lower extremity examination reveals no evidence of edema.  Femoral pulses are 2+ and symmetric bilaterally.  No bruits heard.  NEUROLOGIC:  Grossly nonfocal. The patient is alert and oriented with no  obvious motor or sensory deficits, although gait was not tested.  The  patient appears to move all 4 extremities without difficulty.   Chest x-ray essentially negative on preliminary review [facility]).   EKG revealed sinus rhythm with a rate of 70 with a normal axis.  Normal PR,  QRS, QTC intervals.  No Q waves.  No changes diagnostic of ischemia.  Minimal non-diagnostic ST-T wave abnormalities.  No evidence of left  ventricular hypertrophy.  No prior EKGs for comparison.   LABORATORY VALUES:  White blood cell count of 10.1, hematocrit 4, platelet  count 279.  Sodium 134, potassium 4.1, chloride 103, bicarbonate 25, BUN 10,  creatinine 0.9, glucose 298, calcium 9.4.  CK 43, PTT 25, PT 12, INR 1.0,  troponin I 0.01.   IMPRESSION AND PLAN:  A 40 year old female with type 1 diabetes mellitus,  insulin dependent (diagnosed at 40 years old) on chronic insulin pump therapy  with NovoLog insulin, family history of early coronary artery disease  (mother died at age 83 of a myocardial infarction), hyperlipidemia, tobacco  (quit last week), depression/anxiety/gastric reflux/irritable bowel   syndrome/migraines/known cardiac chest pain in the past with cardiac  catheterization in January of 2006 requiring normal coronary arteries and a  previous Cardiolite in March of 2005 that revealed no evidence of ischemia,  admitted with chest pain, waxing and waning over the past 2 weeks, and  coronary CTA performed by Dr. Andee Lineman revealing a hazy mid-LAD region that  could be secondary to artifact, but not completely clear, with the decision  made to have the patient undergo cardiac catheterization in light of her  ongoing symptomatology and equivocal results on coronary CTA.  Review of  systems notable only for not feeling quite normal times the past 2 weeks.  White blood cell count 10.1.  1.  Chest pain.  The patient describes many features that are typical of      unstable angina and had multiple risk factors for coronary artery      disease, although the patient also describes some features that are     untypical for unstable angina, and has a history of non-cardiac chest      pain with a negative cardiac catheterization in January of 2006.  The      patient's coronary CTA performed by Dr. Andee Lineman had apparently had a      region of mid-lad haziness with subsequent recommendations in light of      the patient's symptomatology for cardiac catheterization by Dr. Andee Lineman.      The patient will be NPO after midnight for cardiac catheterization.  The      patient's creatinine is normal, but in light of her long-standing      diabetes, will administer IV fluids and Mucomyst prior to cardiac      catheterization.  Will continue aspirin, heparin and nitroglycerin drip      and obtain serial cardiac markers to definitely exclude the presence of      myocardial infarction while she is monitored on telemetry.   REVIEW OF SYSTEMS:  Not feeling  quite normal.  The patient is not able to  pinpoint the etiology of these feeling, other than chest discomfort that  she describes in the HPI.  Her white  blood cell count is at the upper limit  normal.  She had no bowel or bladder complaints.  Her [facility] at  [facility] is preliminarily a negative.  Will obtain a calcium and  urine culture to exclude any occult infection that might explain the  patient's description of not feeling quite normal).  1.  History of depression/anxiety.  Continue the Celexa as previously      prescribed, with benzodiazepines p.r.n.  2.  History of migraines.  The patient has used Imitrex and tramadol in the      past.  She currently has no headaches; however, these agents could be      considered for administration should headaches recur during this      hospitalization.  3.  Type 1 diabetes mellitus, insulin dependent on chronic NovoLog insulin      pump.  Will continue insulin pump with NovoLog insulin and sliding scale      t.i.d. a.c. per usual home regimen.  Will decrease pump rate by one-half      when the patient is NPO.  Will check a hemoglobin A1C to assess the      patient's serum glycemic control over the past 2 months.  Will continue      lisinopril 5 mg p.o. daily as previously prescribed.  4.  History of tobacco.  The patient reports that she quit last week, and      will continue to encourage and reinforce the importance of ongoing and      long-term tobacco cessation throughout this hospitalization.  5.  Hyperlipidemia.  Will check LFT's in the morning.  Continue Zocor at the      previously prescribed dose of 20 mg p.o. q.h.s.  Check lipid profile in      the morning to assess the adequacy of lipid control on this dose of      simvastatin.  6.  History of gastric reflux/hiatal hernia.  Will initiate empiric therapy      with proton pump inhibitor for any component of gastric reflux with the      patient's chest discomfort.           ______________________________  Verne Grain, MD     DDH/MEDQ  D:  09/05/2005  T:  09/06/2005  Job:  [id number]

## 2010-12-18 NOTE — H&P (Signed)
[name], [name]                ACCOUNT NO.:  [id number]   MEDICAL RECORD NO.:  [id number]          PATIENT TYPE:  INP   LOCATION:  A207                          FACILITY:  [facility]   PHYSICIAN:  [name], M.D.    DATE OF BIRTH:  09/01/1970   DATE OF ADMISSION:  08/07/2004  DATE OF DISCHARGE:  [facility]                                HISTORY & PHYSICAL   CHIEF COMPLAINT:  Chest pain.   HISTORY OF PRESENT ILLNESS:  This is a 40 year old female with history of  juvenile onset diabetes (onset age 32). She is currently maintained on an  Insulin pump with good control.  She also has a history of anxiety  depression and migraine headaches. Lipid status is currently unknown. She is  status post hysterectomy, otherwise past history is benign.   The patient presented to our office the day of admission with a 3-day  history of chest tightness.  She has had some questionable exertional  symptoms and dyspnea. Her symptoms were intermittent and became worse the  day of admission. She presented to the office for evaluation.  An EKG and  chest x-ray were benign. Her O2 saturation was 98% on room air. She was sent  to Dr. Roque Lias office for further evaluation, who at that point sent her  to the emergency department.   In the emergency department evaluation was essentially benign. EKG showed  nonspecific changes only; D-dimer was negative, O2 saturations fine. Cardiac  markers negative. CBC and MET-7 within normal limits.   The patient is admitted for chest pain, rule out acute coronary syndrome.   There is no history of change in headache pattern, neurologic deficits,  nausea, vomiting, diarrhea, melena, hematemesis or hematochezia or  genitourinary symptoms. She also denies cough, pleuritic pain, hemoptysis.  There is no history of paroxysmal nocturnal dyspnea or orthopnea.   CURRENT MEDICATIONS:  1.  Insulin pump.  2.  Amitriptyline 25 to 50 mg q.h.s. p.r.n.  3.  Celexa 40 mg daily.  4.   Topamax 100 mg q.h.s.  5.  Phenergan p.r.n.  6.  Estradiol 1.5 mg daily.   ALLERGIES:  PENICILLIN AND SULFA.   PAST MEDICAL HISTORY:  As noted above.   REVIEW OF SYSTEMS:  Negative except as mentioned.   FAMILY HISTORY:  Noncontributory.   PHYSICAL EXAMINATION:  GENERAL:  This is a very pleasant female who is alert  and oriented and in no acute distress.  PRESENTING VITAL SIGNS:  Temperature is 97.3, blood pressure 118/72, heart  rate 97, respirations 18, O2 saturation 98%.  HEENT:  Normocephalic, atraumatic. Pupils are equal. Ears, nose, throat are  benign.  NECK: Supple without bruits or thyromegaly, lymphadenopathy or masses.  LUNGS:  Clear.  HEART:  Sounds are perfectly normal without murmurs, rubs, or gallops.  ABDOMEN:  Nontender, nondistended. Bowel sounds are intact.  EXTREMITIES:  No clubbing, cyanosis or edema.  NEUROLOGIC EXAM:  Nonfocal.   PERTINENT LAB:  As noted above.   ASSESSMENT:  Chest pain in a 40 year old female with long-standing history  of insulin-dependent diabetes mellitus.  Consider acute coronary  syndrome,  pericarditis or other.   PLAN:  Rule out myocardial infarction protocol. Will continue current  medications. Will follow and treat expectantly.      MC/MEDQ  D:  08/08/2004  T:  08/08/2004  Job:  [id number]

## 2011-04-27 LAB — BASIC METABOLIC PANEL
BUN: 5 — ABNORMAL LOW
Chloride: 101
GFR calc Af Amer: >60
GFR calc non Af Amer: >60
Potassium: 4.6
Sodium: 138

## 2011-04-27 LAB — CBC
Hemoglobin: 14.2
MCHC: 34.3
MCV: 88.8
RBC: 4.67
RDW: 12.6

## 2011-04-27 LAB — LIPID PANEL
LDL Cholesterol: 65
Total CHOL/HDL Ratio: 3.8
VLDL: 47 — ABNORMAL HIGH

## 2011-04-27 LAB — HEPARIN LEVEL (UNFRACTIONATED): Heparin Unfractionated: 0.57

## 2011-04-27 LAB — CARDIAC PANEL(CRET KIN+CKTOT+MB+TROPI)
CK, MB: 0.6
Relative Index: INVALID
Total CK: 45
Total CK: 46
Troponin I: <0.01

## 2011-04-27 LAB — D-DIMER, QUANTITATIVE: D-Dimer, Quant: 0.84 — ABNORMAL HIGH

## 2011-10-01 DIAGNOSIS — R072 Precordial pain: Secondary | ICD-10-CM

## 2012-05-02 DIAGNOSIS — R079 Chest pain, unspecified: Secondary | ICD-10-CM

## 2012-05-03 DIAGNOSIS — R079 Chest pain, unspecified: Secondary | ICD-10-CM

## 2012-10-07 ENCOUNTER — Emergency Department (HOSPITAL_COMMUNITY)
Admission: EM | Admit: 2012-10-07 | Discharge: 2012-10-07 | Disposition: A | Discharge status: Home / Self Care | Payer: Medicare Other | Attending: Emergency Medicine | Admitting: Emergency Medicine

## 2012-10-07 ENCOUNTER — Encounter (HOSPITAL_COMMUNITY): Payer: Self-pay | Admitting: *Deleted

## 2012-10-07 ENCOUNTER — Emergency Department (HOSPITAL_COMMUNITY): Payer: Medicare Other

## 2012-10-07 DIAGNOSIS — Z79899 Other long term (current) drug therapy: Secondary | ICD-10-CM | POA: Insufficient documentation

## 2012-10-07 DIAGNOSIS — F3289 Other specified depressive episodes: Secondary | ICD-10-CM | POA: Insufficient documentation

## 2012-10-07 DIAGNOSIS — F329 Major depressive disorder, single episode, unspecified: Secondary | ICD-10-CM | POA: Insufficient documentation

## 2012-10-07 DIAGNOSIS — Z794 Long term (current) use of insulin: Secondary | ICD-10-CM | POA: Insufficient documentation

## 2012-10-07 DIAGNOSIS — F17200 Nicotine dependence, unspecified, uncomplicated: Secondary | ICD-10-CM | POA: Insufficient documentation

## 2012-10-07 DIAGNOSIS — G5790 Unspecified mononeuropathy of unspecified lower limb: Secondary | ICD-10-CM | POA: Insufficient documentation

## 2012-10-07 DIAGNOSIS — R51 Headache: Secondary | ICD-10-CM | POA: Insufficient documentation

## 2012-10-07 DIAGNOSIS — R61 Generalized hyperhidrosis: Secondary | ICD-10-CM | POA: Insufficient documentation

## 2012-10-07 DIAGNOSIS — G8929 Other chronic pain: Secondary | ICD-10-CM | POA: Insufficient documentation

## 2012-10-07 DIAGNOSIS — M549 Dorsalgia, unspecified: Secondary | ICD-10-CM | POA: Insufficient documentation

## 2012-10-07 DIAGNOSIS — R110 Nausea: Secondary | ICD-10-CM | POA: Insufficient documentation

## 2012-10-07 DIAGNOSIS — E119 Type 2 diabetes mellitus without complications: Secondary | ICD-10-CM | POA: Insufficient documentation

## 2012-10-07 HISTORY — DX: Depression, unspecified: F32.A

## 2012-10-07 HISTORY — DX: Major depressive disorder, single episode, unspecified: F32.9

## 2012-10-07 HISTORY — DX: Type 2 diabetes mellitus without complications: E11.9

## 2012-10-07 LAB — URINALYSIS, ROUTINE W REFLEX MICROSCOPIC
Bilirubin Urine: NEGATIVE
Glucose, UA: >1000 mg/dL — AB
Protein, ur: NEGATIVE mg/dL
pH: 8 (ref 5.0–8.0)

## 2012-10-07 LAB — COMPREHENSIVE METABOLIC PANEL
Albumin: 4 g/dL (ref 3.5–5.2)
Alkaline Phosphatase: 115 U/L (ref 39–117)
BUN: 10 mg/dL (ref 6–23)
CO2: 29 mEq/L (ref 19–32)
Chloride: 99 mEq/L (ref 96–112)
Creatinine, Ser: 0.75 mg/dL (ref 0.50–1.10)
GFR calc non Af Amer: >90 mL/min (ref >90)
Potassium: 4.4 mEq/L (ref 3.5–5.1)
Total Bilirubin: 0.2 mg/dL — ABNORMAL LOW (ref 0.3–1.2)

## 2012-10-07 LAB — CBC WITH DIFFERENTIAL/PLATELET
Basophils Relative: 1 % (ref 0–1)
HCT: 40 % (ref 36.0–46.0)
Hemoglobin: 14 g/dL (ref 12.0–15.0)
Lymphocytes Relative: 46 % (ref 12–46)
Lymphs Abs: 3.7 10*3/uL (ref 0.7–4.0)
MCHC: 35 g/dL (ref 30.0–36.0)
Monocytes Absolute: 0.5 10*3/uL (ref 0.1–1.0)
Monocytes Relative: 7 % (ref 3–12)
Neutro Abs: 3.6 10*3/uL (ref 1.7–7.7)
Neutrophils Relative %: 45 % (ref 43–77)
RBC: 4.56 MIL/uL (ref 3.87–5.11)
WBC: 8.1 10*3/uL (ref 4.0–10.5)

## 2012-10-07 LAB — LIPASE, BLOOD: Lipase: 16 U/L (ref 11–59)

## 2012-10-07 MED ORDER — HYDROMORPHONE HCL PF 2 MG/ML IJ SOLN
2.0000 mg | Freq: Once | INTRAMUSCULAR | Status: AC
  Administered 2012-10-07: 2 mg via INTRAMUSCULAR
  Filled 2012-10-07: qty 1

## 2012-10-07 MED ORDER — TRAMADOL HCL 50 MG PO TABS: 50.0000 mg | ORAL_TABLET | Freq: Four times a day (QID) | ORAL | Status: DC | PRN

## 2012-10-07 MED ORDER — ONDANSETRON 4 MG PO TBDP
4.0000 mg | ORAL_TABLET | Freq: Once | ORAL | Status: AC
  Administered 2012-10-07: 4 mg via ORAL
  Filled 2012-10-07: qty 1

## 2012-10-07 NOTE — ED Provider Notes (Signed)
History  This chart was scribed for Shelda Jakes, MD by Erskine Emery, ED Scribe. This patient was seen in room [facility]11[facility]11 and the patient's care was started at 19:01.   CSN: [id number]  Arrival date & time 10/07/12  1707   First MD Initiated Contact with Patient 10/07/12 1901      Chief Complaint  Patient presents with  . Back Pain    (Consider location/radiation/quality/duration/timing/severity/associated sxs/prior treatment) The history is provided by the patient and the spouse. No language interpreter was used.  [name] is a 42 y.o. female who presents to the Emergency Department complaining of aggravated lower back pain that radiates to the right paraspinous lumbar muscles for the past few days. Pt also reports pain through her spine, from cervical to lumbar, worst at the cervical thoracic junction. Her pain is aggravated by movement. Pt reports some associated nausea, hot flashes, night sweats, and mild headache but denies any abdominal pain, fevers, cold symptoms, visual changes, chest pain, difficulty breathing, emesis, diarrhea, rash, back trauma or injury, or h/o bleeding easily. Pt reports similar pains since November. She has been evaluated by her PCP for the same complaint, with several diagnoses but no change in pain. Originally he thought the pain was related to her bowels, then her kidneys, then possibly a pulled muscle. Her PCP never x-rayed her back or did blood work. Pt has a h/o neuropathy in her legs that causes a lack of feeling and sometimes pain, but she has noticed no recent changes. Pt is also diabetic and her blood glucose levels have been fluctuating lately. Pt's sister died of colon cancer last year, but the pt had a colonoscopy in November.  Dr. Camelia Eng is the pt's PCP.  Past Medical History  Diagnosis Date  . Diabetes mellitus without complication   . Neuropathy   . Depression     Past Surgical History  Procedure Laterality Date  .  Cesarean section    . Tubal ligation    . Abdominal hysterectomy    . Appendectomy    . Cholecystectomy    . Cyst excision      right breast    No family history on file.  History  Substance Use Topics  . Smoking status: Current Every Day Smoker  . Smokeless tobacco: Not on file  . Alcohol Use: No    OB History   Grav Para Term Preterm Abortions TAB SAB Ect Mult Living                  Review of Systems  Constitutional: Positive for diaphoresis. Negative for fever.  HENT: Negative for congestion, sore throat, rhinorrhea and neck pain.   Eyes: Negative for visual disturbance.  Respiratory: Negative for shortness of breath.   Cardiovascular: Negative for chest pain.  Gastrointestinal: Positive for nausea. Negative for abdominal pain.  Genitourinary: Negative for dysuria.  Musculoskeletal: Positive for back pain.  Skin: Negative for rash.  Neurological: Positive for headaches.  Hematological: Does not bruise/bleed easily.  Psychiatric/Behavioral: Negative for confusion.  All other systems reviewed and are negative.    Allergies  Clindamycin/lincomycin; Codeine; Penicillins; and Sulfa antibiotics  Home Medications   Current Outpatient Rx  Name  Route  Sig  Dispense  Refill  . ALPRAZolam (XANAX) 0.5 MG tablet   Oral   Take 0.5 mg by mouth 3 (three) times daily as needed for sleep or anxiety.         . docusate sodium (COLACE) 100 MG  capsule   Oral   Take 100 mg by mouth 2 (two) times daily.         Marland Kitchen estradiol (ESTRACE) 0.5 MG tablet   Oral   Take 0.5 mg by mouth every morning.         . gabapentin (NEURONTIN) 400 MG capsule   Oral   Take 400 mg by mouth 4 (four) times daily.         Marland Kitchen HYDROcodone-acetaminophen (NORCO) 7.5-325 MG per tablet   Oral   Take 1 tablet by mouth every 8 (eight) hours as needed for pain.         Marland Kitchen insulin glargine (LANTUS SOLOSTAR) 100 UNIT/ML injection   Subcutaneous   Inject 30 Units into the skin at bedtime.          . insulin lispro protamine-insulin lispro (HUMALOG 75/25) (75-25) 100 UNIT/ML SUSP   Subcutaneous   Inject 40 Units into the skin every morning.         Marland Kitchen lisinopril (PRINIVIL,ZESTRIL) 5 MG tablet   Oral   Take 5 mg by mouth every morning.         . Multiple Vitamin (MULTIVITAMIN WITH MINERALS) TABS   Oral   Take 1 tablet by mouth every morning.         . polycarbophil (FIBERCON) 625 MG tablet   Oral   Take 625 mg by mouth 2 (two) times daily.         . simvastatin (ZOCOR) 20 MG tablet   Oral   Take 20 mg by mouth at bedtime.           Triage Vitals: BP 111/74  Pulse 81  Temp(Src) 98.1 F (36.7 C) (Oral)  Resp 20  Ht 5\' 5"  (1.651 m)  Wt 160 lb (72.576 kg)  BMI 26.63 kg/m2  SpO2 99%  Physical Exam  Nursing note and vitals reviewed. Constitutional: She is oriented to person, place, and time. She appears well-developed and well-nourished. No distress.  HENT:  Head: Normocephalic and atraumatic.  Eyes: EOM are normal. Pupils are equal, round, and reactive to light.  Neck: Neck supple. No tracheal deviation present.  Cardiovascular: Normal rate, regular rhythm and normal heart sounds.   Pulmonary/Chest: Effort normal and breath sounds normal. No respiratory distress.  Lungs are clear  Abdominal: Soft. Bowel sounds are normal. She exhibits no distension. There is no tenderness.  Musculoskeletal: Normal range of motion. She exhibits no edema and no tenderness.  No back tenderness to palpation.  Neurological: She is alert and oriented to person, place, and time. No cranial nerve deficit. Coordination normal.  Skin: Skin is warm and dry.  Psychiatric: She has a normal mood and affect.    ED Course  Procedures (including critical care time) DIAGNOSTIC STUDIES: Oxygen Saturation is 99% on room air, normal by my interpretation.    COORDINATION OF CARE: 19:37--I evaluated the patient and we discussed a treatment plan including back x-rays and CAT scans, lab  work, check of kidney function, and referral to a spine specialist to which the pt agreed.    Labs Reviewed  COMPREHENSIVE METABOLIC PANEL - Abnormal; Notable for the following:    Glucose, Bld 325 (*)    Total Bilirubin 0.2 (*)    All other components within normal limits  URINALYSIS, ROUTINE W REFLEX MICROSCOPIC - Abnormal; Notable for the following:    Color, Urine STRAW (*)    Glucose, UA >1000 (*)    Ketones, ur TRACE (*)  All other components within normal limits  CBC WITH DIFFERENTIAL  LIPASE, BLOOD  URINE MICROSCOPIC-ADD ON   Ct Thoracic Spine Wo Contrast  10/07/2012  *RADIOLOGY REPORT*  Clinical Data:  Mid back pain for 3 months.  CT THORACIC AND LUMBAR SPINE WITHOUT CONTRAST  Technique:  Multidetector CT imaging of the thoracic and lumbar spine was performed without contrast. Multiplanar CT image reconstructions were also generated.  Comparison:  CTA of the chest performed 11/16/2005, and CT of the abdomen and pelvis performed 06/14/2005  CT THORACIC SPINE  Findings:  There is no evidence of fracture or subluxation along the thoracic spine.  Vertebral bodies demonstrate normal height and alignment.  Anterior osteophytes are noted at T3-T4.  There is otherwise no evidence of degenerative change along the thoracic spine.  Intervertebral disc spaces are preserved.  The visualized neural foramina are grossly unremarkable in appearance.  Posterior elements are within normal limits.  The visualized portions of the thyroid gland are unremarkable in appearance.  The visualized portions of the mediastinum are grossly unremarkable.  The visualized portions of the lungs are clear.  The patient is status post cholecystectomy.  The visualized paraspinal musculature is unremarkable in appearance.  IMPRESSION: No evidence of fracture or subluxation along the thoracic spine. No significant degenerative change seen.  CT LUMBAR SPINE  Findings: There is no evidence of fracture or subluxation along the  lumbar spine.  The visualized vertebral bodies demonstrate normal height and alignment.  Intervertebral disc spaces are preserved. No disc protrusions are seen; the neural foramina appear intact bilaterally.  The visualized portions of the sacrum are grossly unremarkable. There is mild transitional anatomy at the lumbosacral spine.  The visualized paraspinal musculature is unremarkable in appearance.  The visualized intra-abdominal structures are unremarkable in appearance.  IMPRESSION: No evidence of fracture or subluxation along the lumbar spine.  No evidence of degenerative change along the lumbar spine.   Original Report Authenticated By: Tonia Ghent, M.D.    Ct Lumbar Spine Wo Contrast  10/07/2012  *RADIOLOGY REPORT*  Clinical Data:  Mid back pain for 3 months.  CT THORACIC AND LUMBAR SPINE WITHOUT CONTRAST  Technique:  Multidetector CT imaging of the thoracic and lumbar spine was performed without contrast. Multiplanar CT image reconstructions were also generated.  Comparison:  CTA of the chest performed 11/16/2005, and CT of the abdomen and pelvis performed 06/14/2005  CT THORACIC SPINE  Findings:  There is no evidence of fracture or subluxation along the thoracic spine.  Vertebral bodies demonstrate normal height and alignment.  Anterior osteophytes are noted at T3-T4.  There is otherwise no evidence of degenerative change along the thoracic spine.  Intervertebral disc spaces are preserved.  The visualized neural foramina are grossly unremarkable in appearance.  Posterior elements are within normal limits.  The visualized portions of the thyroid gland are unremarkable in appearance.  The visualized portions of the mediastinum are grossly unremarkable.  The visualized portions of the lungs are clear.  The patient is status post cholecystectomy.  The visualized paraspinal musculature is unremarkable in appearance.  IMPRESSION: No evidence of fracture or subluxation along the thoracic spine. No significant  degenerative change seen.  CT LUMBAR SPINE  Findings: There is no evidence of fracture or subluxation along the lumbar spine.  The visualized vertebral bodies demonstrate normal height and alignment.  Intervertebral disc spaces are preserved. No disc protrusions are seen; the neural foramina appear intact bilaterally.  The visualized portions of the sacrum are grossly unremarkable. There is  mild transitional anatomy at the lumbosacral spine.  The visualized paraspinal musculature is unremarkable in appearance.  The visualized intra-abdominal structures are unremarkable in appearance.  IMPRESSION: No evidence of fracture or subluxation along the lumbar spine.  No evidence of degenerative change along the lumbar spine.   Original Report Authenticated By: Tonia Ghent, M.D.    Results for orders placed during the hospital encounter of 10/07/12  CBC WITH DIFFERENTIAL      Result Value Range   WBC 8.1  4.0 - 10.5 K/uL   RBC 4.56  3.87 - 5.11 MIL/uL   Hemoglobin 14.0  12.0 - 15.0 g/dL   HCT 40.9  81.1 - 91.4 %   MCV 87.7  78.0 - 100.0 fL   MCH 30.7  26.0 - 34.0 pg   MCHC 35.0  30.0 - 36.0 g/dL   RDW 78.2  95.6 - 21.3 %   Platelets 220  150 - 400 K/uL   Neutrophils Relative 45  43 - 77 %   Neutro Abs 3.6  1.7 - 7.7 K/uL   Lymphocytes Relative 46  12 - 46 %   Lymphs Abs 3.7  0.7 - 4.0 K/uL   Monocytes Relative 7  3 - 12 %   Monocytes Absolute 0.5  0.1 - 1.0 K/uL   Eosinophils Relative 2  0 - 5 %   Eosinophils Absolute 0.2  0.0 - 0.7 K/uL   Basophils Relative 1  0 - 1 %   Basophils Absolute 0.1  0.0 - 0.1 K/uL  COMPREHENSIVE METABOLIC PANEL      Result Value Range   Sodium 136  135 - 145 mEq/L   Potassium 4.4  3.5 - 5.1 mEq/L   Chloride 99  96 - 112 mEq/L   CO2 29  19 - 32 mEq/L   Glucose, Bld 325 (*) 70 - 99 mg/dL   BUN 10  6 - 23 mg/dL   Creatinine, Ser 0.86  0.50 - 1.10 mg/dL   Calcium 9.7  8.4 - 57.8 mg/dL   Total Protein 6.9  6.0 - 8.3 g/dL   Albumin 4.0  3.5 - 5.2 g/dL   AST 14  0 -  37 U/L   ALT 18  0 - 35 U/L   Alkaline Phosphatase 115  39 - 117 U/L   Total Bilirubin 0.2 (*) 0.3 - 1.2 mg/dL   GFR calc non Af Amer >90  >90 mL/min   GFR calc Af Amer >90  >90 mL/min  LIPASE, BLOOD      Result Value Range   Lipase 16  11 - 59 U/L  URINALYSIS, ROUTINE W REFLEX MICROSCOPIC      Result Value Range   Color, Urine STRAW (*) YELLOW   APPearance CLEAR  CLEAR   Specific Gravity, Urine 1.010  1.005 - 1.030   pH 8.0  5.0 - 8.0   Glucose, UA >1000 (*) NEGATIVE mg/dL   Hgb urine dipstick NEGATIVE  NEGATIVE   Bilirubin Urine NEGATIVE  NEGATIVE   Ketones, ur TRACE (*) NEGATIVE mg/dL   Protein, ur NEGATIVE  NEGATIVE mg/dL   Urobilinogen, UA 0.2  0.0 - 1.0 mg/dL   Nitrite NEGATIVE  NEGATIVE   Leukocytes, UA NEGATIVE  NEGATIVE  URINE MICROSCOPIC-ADD ON      Result Value Range   Squamous Epithelial / LPF RARE  RARE   WBC, UA 0-2  <3 WBC/hpf      1. Chronic back pain       MDM  CT scans of the thoracic lumbar area without any acute bony injury or abnormalities. Will refer patient to a spine clinic and most likely need an MRI for further evaluation. Patient is stable for discharge home. Labs without significant abnormalities. Patient does have a known history of diabetes blood sugar was elevated but no evidence of acidosis.      I personally performed the services described in this documentation, which was scribed in my presence. The recorded information has been reviewed and is accurate.     Shelda Jakes, MD 10/07/12 [phone]

## 2012-10-07 NOTE — ED Notes (Signed)
Pt c/o mid upper back pain that radiates to right side of back area, pain is associated with nausea, pt states that she started experiencing the pain at the end of November, has been seen by pcp and was advised several different diagnosis with no change in pain, pain became worse over the past few days,

## 2013-01-29 DIAGNOSIS — R0789 Other chest pain: Secondary | ICD-10-CM

## 2013-03-07 DIAGNOSIS — M779 Enthesopathy, unspecified: Secondary | ICD-10-CM

## 2013-03-07 HISTORY — DX: Enthesopathy, unspecified: M77.9

## 2013-09-04 DIAGNOSIS — H8110 Benign paroxysmal vertigo, unspecified ear: Secondary | ICD-10-CM

## 2013-09-04 HISTORY — DX: Benign paroxysmal vertigo, unspecified ear: H81.10

## 2013-11-05 DIAGNOSIS — G629 Polyneuropathy, unspecified: Secondary | ICD-10-CM

## 2013-11-05 HISTORY — DX: Polyneuropathy, unspecified: G62.9

## 2014-01-28 DIAGNOSIS — K219 Gastro-esophageal reflux disease without esophagitis: Secondary | ICD-10-CM | POA: Diagnosis present

## 2014-03-15 DIAGNOSIS — F4001 Agoraphobia with panic disorder: Secondary | ICD-10-CM

## 2014-03-15 HISTORY — DX: Agoraphobia with panic disorder: F40.01

## 2014-04-15 DIAGNOSIS — F3289 Other specified depressive episodes: Secondary | ICD-10-CM | POA: Insufficient documentation

## 2014-04-15 DIAGNOSIS — H219 Unspecified disorder of iris and ciliary body: Secondary | ICD-10-CM | POA: Insufficient documentation

## 2014-04-15 DIAGNOSIS — Z794 Long term (current) use of insulin: Secondary | ICD-10-CM | POA: Diagnosis not present

## 2014-04-15 DIAGNOSIS — R1110 Vomiting, unspecified: Secondary | ICD-10-CM | POA: Insufficient documentation

## 2014-04-15 DIAGNOSIS — Z79899 Other long term (current) drug therapy: Secondary | ICD-10-CM | POA: Insufficient documentation

## 2014-04-15 DIAGNOSIS — Z880 Allergy status to penicillin: Secondary | ICD-10-CM | POA: Insufficient documentation

## 2014-04-15 DIAGNOSIS — G589 Mononeuropathy, unspecified: Secondary | ICD-10-CM | POA: Diagnosis not present

## 2014-04-15 DIAGNOSIS — F17200 Nicotine dependence, unspecified, uncomplicated: Secondary | ICD-10-CM | POA: Diagnosis not present

## 2014-04-15 DIAGNOSIS — R51 Headache: Secondary | ICD-10-CM | POA: Insufficient documentation

## 2014-04-15 DIAGNOSIS — E119 Type 2 diabetes mellitus without complications: Secondary | ICD-10-CM | POA: Diagnosis not present

## 2014-04-15 DIAGNOSIS — F329 Major depressive disorder, single episode, unspecified: Secondary | ICD-10-CM | POA: Insufficient documentation

## 2014-04-16 ENCOUNTER — Emergency Department (HOSPITAL_COMMUNITY)
Admission: EM | Admit: 2014-04-16 | Discharge: 2014-04-16 | Disposition: A | Discharge status: Home / Self Care | Payer: Medicare Other | Attending: Emergency Medicine | Admitting: Emergency Medicine

## 2014-04-16 ENCOUNTER — Encounter (HOSPITAL_COMMUNITY): Payer: Self-pay | Admitting: Emergency Medicine

## 2014-04-16 DIAGNOSIS — G43909 Migraine, unspecified, not intractable, without status migrainosus: Secondary | ICD-10-CM

## 2014-04-16 MED ORDER — SODIUM CHLORIDE 0.9 % IV BOLUS (SEPSIS)
1000.0000 mL | Freq: Once | INTRAVENOUS | Status: AC
  Administered 2014-04-16: 1000 mL via INTRAVENOUS

## 2014-04-16 MED ORDER — METOCLOPRAMIDE HCL 5 MG/ML IJ SOLN
10.0000 mg | Freq: Once | INTRAMUSCULAR | Status: AC
  Administered 2014-04-16: 10 mg via INTRAVENOUS
  Filled 2014-04-16: qty 2

## 2014-04-16 MED ORDER — KETOROLAC TROMETHAMINE 30 MG/ML IJ SOLN
30.0000 mg | Freq: Once | INTRAMUSCULAR | Status: AC
  Administered 2014-04-16: 30 mg via INTRAVENOUS
  Filled 2014-04-16: qty 1

## 2014-04-16 MED ORDER — DIPHENHYDRAMINE HCL 50 MG/ML IJ SOLN
25.0000 mg | Freq: Once | INTRAMUSCULAR | Status: AC
  Administered 2014-04-16: 25 mg via INTRAVENOUS
  Filled 2014-04-16: qty 1

## 2014-04-16 MED ORDER — HYDROMORPHONE HCL PF 1 MG/ML IJ SOLN
0.5000 mg | Freq: Once | INTRAMUSCULAR | Status: AC
  Administered 2014-04-16: 0.5 mg via INTRAVENOUS
  Filled 2014-04-16: qty 1

## 2014-04-16 NOTE — Discharge Instructions (Signed)
Migraine Headache A migraine headache is an intense, throbbing pain on one or both sides of your head. A migraine can last for 30 minutes to several hours. CAUSES  The exact cause of a migraine headache is not always known. However, a migraine may be caused when nerves in the brain become irritated and release chemicals that cause inflammation. This causes pain. Certain things may also trigger migraines, such as:  Alcohol.  Smoking.  Stress.  Menstruation.  Aged cheeses.  Foods or drinks that contain nitrates, glutamate, aspartame, or tyramine.  Lack of sleep.  Chocolate.  Caffeine.  Hunger.  Physical exertion.  Fatigue.  Medicines used to treat chest pain (nitroglycerine), birth control pills, estrogen, and some blood pressure medicines. SIGNS AND SYMPTOMS  Pain on one or both sides of your head.  Pulsating or throbbing pain.  Severe pain that prevents daily activities.  Pain that is aggravated by any physical activity.  Nausea, vomiting, or both.  Dizziness.  Pain with exposure to bright lights, loud noises, or activity.  General sensitivity to bright lights, loud noises, or smells. Before you get a migraine, you may get warning signs that a migraine is coming (aura). An aura may include:  Seeing flashing lights.  Seeing bright spots, halos, or zigzag lines.  Having tunnel vision or blurred vision.  Having feelings of numbness or tingling.  Having trouble talking.  Having muscle weakness. DIAGNOSIS  A migraine headache is often diagnosed based on:  Symptoms.  Physical exam.  A CT scan or MRI of your head. These imaging tests cannot diagnose migraines, but they can help rule out other causes of headaches. TREATMENT Medicines may be given for pain and nausea. Medicines can also be given to help prevent recurrent migraines.  HOME CARE INSTRUCTIONS  Only take over-the-counter or prescription medicines for pain or discomfort as directed by your  health care provider. The use of long-term narcotics is not recommended.  Lie down in a dark, quiet room when you have a migraine.  Keep a journal to find out what may trigger your migraine headaches. For example, write down:  What you eat and drink.  How much sleep you get.  Any change to your diet or medicines.  Limit alcohol consumption.  Quit smoking if you smoke.  Get 7-9 hours of sleep, or as recommended by your health care provider.  Limit stress.  Keep lights dim if bright lights bother you and make your migraines worse. SEEK IMMEDIATE MEDICAL CARE IF:   Your migraine becomes severe.  You have a fever.  You have a stiff neck.  You have vision loss.  You have muscular weakness or loss of muscle control.  You start losing your balance or have trouble walking.  You feel faint or pass out.  You have severe symptoms that are different from your first symptoms. MAKE SURE YOU:   Understand these instructions.  Will watch your condition.  Will get help right away if you are not doing well or get worse. Document Released: 07/19/2005 Document Revised: 12/03/2013 Document Reviewed: 03/26/2013 [facility] Patient Information 2015 [place], [place]. This information is not intended to replace advice given to you by your health care provider. Make sure you discuss any questions you have with your health care provider.  Rest in quiet dark room. Increase fluids.

## 2014-04-16 NOTE — ED Notes (Signed)
Patient c/o headache and vomiting since yesterday. 

## 2014-04-16 NOTE — ED Provider Notes (Signed)
CSN: [id number]     Arrival date & time 04/15/14  2357 History   First MD Initiated Contact with Patient 04/16/14 [phone]     Chief Complaint  Patient presents with  . Headache     (Consider location/radiation/quality/duration/timing/severity/associated sxs/prior Treatment) HPI...Marland KitchenMarland Kitchen persistent migraine headache for 24 hours with associated vomiting. No fever, chills, stiff neck, neurological deficits. She has had migraine headaches before. She has tried over-the-counter medications including Tylenol and ibuprofen with minimal relief. Severity is mild to moderate. She is ambulatory.  Past Medical History  Diagnosis Date  . Diabetes mellitus without complication   . Neuropathy   . Depression    Past Surgical History  Procedure Laterality Date  . Cesarean section    . Tubal ligation    . Abdominal hysterectomy    . Appendectomy    . Cholecystectomy    . Cyst excision      right breast   No family history on file. History  Substance Use Topics  . Smoking status: Current Every Day Smoker  . Smokeless tobacco: Not on file  . Alcohol Use: No   OB History   Grav Para Term Preterm Abortions TAB SAB Ect Mult Living                 Review of Systems  All other systems reviewed and are negative.     Allergies  Clindamycin/lincomycin; Codeine; Penicillins; and Sulfa antibiotics  Home Medications   Prior to Admission medications   Medication Sig Start Date End Date Taking? Authorizing Provider  docusate sodium (COLACE) 100 MG capsule Take 100 mg by mouth 2 (two) times daily.   Yes Historical Provider, MD  estradiol (ESTRACE) 0.5 MG tablet Take 0.5 mg by mouth every morning.   Yes Historical Provider, MD  gabapentin (NEURONTIN) 400 MG capsule Take 400 mg by mouth 4 (four) times daily.   Yes Historical Provider, MD  insulin glargine (LANTUS SOLOSTAR) 100 UNIT/ML injection Inject 30 Units into the skin at bedtime.   Yes Historical Provider, MD  insulin lispro protamine-insulin  lispro (HUMALOG 75/25) (75-25) 100 UNIT/ML SUSP Inject 12 Units into the skin every morning.    Yes Historical Provider, MD  lisinopril (PRINIVIL,ZESTRIL) 5 MG tablet Take 5 mg by mouth every morning.   Yes Historical Provider, MD  Multiple Vitamin (MULTIVITAMIN WITH MINERALS) TABS Take 1 tablet by mouth every morning.   Yes Historical Provider, MD  polycarbophil (FIBERCON) 625 MG tablet Take 625 mg by mouth 2 (two) times daily.   Yes Historical Provider, MD  simvastatin (ZOCOR) 20 MG tablet Take 20 mg by mouth at bedtime.   Yes Historical Provider, MD  ALPRAZolam Duanne Moron) 0.5 MG tablet Take 0.5 mg by mouth 3 (three) times daily as needed for sleep or anxiety.    Historical Provider, MD  HYDROcodone-acetaminophen (NORCO) 7.5-325 MG per tablet Take 1 tablet by mouth every 8 (eight) hours as needed for pain.    Historical Provider, MD  traMADol (ULTRAM) 50 MG tablet Take 1 tablet (50 mg total) by mouth every 6 (six) hours as needed for pain. 10/07/12   Fredia Sorrow, MD   BP 107/70  Pulse 88  Temp(Src) 97.7 F (36.5 C) (Oral)  Resp 18  Ht 5\' 7"  (1.702 m)  Wt 143 lb (64.864 kg)  BMI 22.39 kg/m2  SpO2 98% Physical Exam  Nursing note and vitals reviewed. Constitutional: She is oriented to person, place, and time. She appears well-developed and well-nourished.  HENT:  Head: Normocephalic and  atraumatic.  Slight photophobia  Eyes: Conjunctivae and EOM are normal. Pupils are equal, round, and reactive to light.  Neck: Normal range of motion. Neck supple.  Cardiovascular: Normal rate, regular rhythm and normal heart sounds.   Pulmonary/Chest: Effort normal and breath sounds normal.  Abdominal: Soft. Bowel sounds are normal.  Musculoskeletal: Normal range of motion.  Neurological: She is alert and oriented to person, place, and time.  Skin: Skin is warm and dry.  Psychiatric: She has a normal mood and affect. Her behavior is normal.    ED Course  Procedures (including critical care  time) Labs Review Labs Reviewed - No data to display  Imaging Review No results found.   EKG Interpretation None      MDM   Final diagnoses:  Migraine without status migrainosus, not intractable, unspecified migraine type    Patient feels much better after IV fluids, IV Toradol, Benadryl, Reglan.  No neurological deficits.    Nat Christen, MD 04/16/14 [phone]

## 2014-06-13 DIAGNOSIS — M542 Cervicalgia: Secondary | ICD-10-CM

## 2014-06-13 HISTORY — DX: Cervicalgia: M54.2

## 2014-06-14 LAB — BASIC METABOLIC PANEL
BUN: 10 mg/dL (ref 4–21)
Creatinine: 0.7 mg/dL (ref 0.5–1.1)
Glucose: 278 mg/dL
POTASSIUM: 4.2 mmol/L (ref 3.4–5.3)
SODIUM: 137 mmol/L (ref 137–147)

## 2014-06-14 LAB — HEPATIC FUNCTION PANEL
ALK PHOS: 129 U/L — AB (ref 25–125)
ALT: 100 U/L — AB (ref 7–35)
AST: 22 U/L (ref 13–35)
BILIRUBIN, TOTAL: 0.4 mg/dL

## 2014-06-14 LAB — HEMOGLOBIN A1C: HEMOGLOBIN A1C: 11.1 % — AB (ref 4.0–6.0)

## 2014-08-16 ENCOUNTER — Emergency Department (HOSPITAL_COMMUNITY): Payer: Medicare Other

## 2014-08-16 ENCOUNTER — Encounter (HOSPITAL_COMMUNITY): Payer: Self-pay | Admitting: Emergency Medicine

## 2014-08-16 ENCOUNTER — Inpatient Hospital Stay (HOSPITAL_COMMUNITY)
Admission: EM | Admit: 2014-08-16 | Discharge: 2014-08-18 | DRG: 948 | Disposition: A | Discharge status: Home / Self Care | Payer: Medicare Other | Attending: Internal Medicine | Admitting: Internal Medicine

## 2014-08-16 DIAGNOSIS — I959 Hypotension, unspecified: Secondary | ICD-10-CM | POA: Diagnosis present

## 2014-08-16 DIAGNOSIS — G459 Transient cerebral ischemic attack, unspecified: Secondary | ICD-10-CM

## 2014-08-16 DIAGNOSIS — E785 Hyperlipidemia, unspecified: Secondary | ICD-10-CM | POA: Diagnosis present

## 2014-08-16 DIAGNOSIS — I951 Orthostatic hypotension: Secondary | ICD-10-CM

## 2014-08-16 DIAGNOSIS — F17210 Nicotine dependence, cigarettes, uncomplicated: Secondary | ICD-10-CM | POA: Diagnosis present

## 2014-08-16 DIAGNOSIS — Z23 Encounter for immunization: Secondary | ICD-10-CM

## 2014-08-16 DIAGNOSIS — Z720 Tobacco use: Secondary | ICD-10-CM | POA: Insufficient documentation

## 2014-08-16 DIAGNOSIS — R4182 Altered mental status, unspecified: Principal | ICD-10-CM | POA: Diagnosis present

## 2014-08-16 DIAGNOSIS — Z90710 Acquired absence of both cervix and uterus: Secondary | ICD-10-CM

## 2014-08-16 DIAGNOSIS — K449 Diaphragmatic hernia without obstruction or gangrene: Secondary | ICD-10-CM | POA: Diagnosis present

## 2014-08-16 DIAGNOSIS — F329 Major depressive disorder, single episode, unspecified: Secondary | ICD-10-CM | POA: Diagnosis present

## 2014-08-16 DIAGNOSIS — Z880 Allergy status to penicillin: Secondary | ICD-10-CM

## 2014-08-16 DIAGNOSIS — I10 Essential (primary) hypertension: Secondary | ICD-10-CM | POA: Diagnosis present

## 2014-08-16 DIAGNOSIS — G43909 Migraine, unspecified, not intractable, without status migrainosus: Secondary | ICD-10-CM | POA: Diagnosis present

## 2014-08-16 DIAGNOSIS — G629 Polyneuropathy, unspecified: Secondary | ICD-10-CM | POA: Diagnosis present

## 2014-08-16 DIAGNOSIS — K589 Irritable bowel syndrome without diarrhea: Secondary | ICD-10-CM | POA: Diagnosis present

## 2014-08-16 DIAGNOSIS — Z882 Allergy status to sulfonamides status: Secondary | ICD-10-CM

## 2014-08-16 DIAGNOSIS — R42 Dizziness and giddiness: Secondary | ICD-10-CM

## 2014-08-16 DIAGNOSIS — Z881 Allergy status to other antibiotic agents status: Secondary | ICD-10-CM

## 2014-08-16 DIAGNOSIS — Z885 Allergy status to narcotic agent status: Secondary | ICD-10-CM

## 2014-08-16 DIAGNOSIS — R410 Disorientation, unspecified: Secondary | ICD-10-CM

## 2014-08-16 DIAGNOSIS — Z79899 Other long term (current) drug therapy: Secondary | ICD-10-CM

## 2014-08-16 DIAGNOSIS — E1165 Type 2 diabetes mellitus with hyperglycemia: Secondary | ICD-10-CM | POA: Diagnosis present

## 2014-08-16 DIAGNOSIS — K219 Gastro-esophageal reflux disease without esophagitis: Secondary | ICD-10-CM | POA: Diagnosis present

## 2014-08-16 DIAGNOSIS — Z9049 Acquired absence of other specified parts of digestive tract: Secondary | ICD-10-CM | POA: Diagnosis present

## 2014-08-16 DIAGNOSIS — Z794 Long term (current) use of insulin: Secondary | ICD-10-CM

## 2014-08-16 HISTORY — DX: Migraine, unspecified, not intractable, without status migrainosus: G43.909

## 2014-08-16 HISTORY — DX: Diaphragmatic hernia without obstruction or gangrene: K44.9

## 2014-08-16 HISTORY — DX: Essential (primary) hypertension: I10

## 2014-08-16 HISTORY — DX: Dizziness and giddiness: R42

## 2014-08-16 HISTORY — DX: Hyperlipidemia, unspecified: E78.5

## 2014-08-16 HISTORY — DX: Gastro-esophageal reflux disease without esophagitis: K21.9

## 2014-08-16 HISTORY — DX: Irritable bowel syndrome, unspecified: K58.9

## 2014-08-16 MED ORDER — MECLIZINE HCL 12.5 MG PO TABS
25.0000 mg | ORAL_TABLET | Freq: Once | ORAL | Status: AC
  Administered 2014-08-16: 25 mg via ORAL
  Filled 2014-08-16: qty 2

## 2014-08-16 NOTE — ED Notes (Signed)
Pt. Reports waking up tonight and feeling confused. Pt. States she felt like it was day time and not Friday. Pt. Is alert and oriented x4. Pt. C/o nausea. Pt. Reports recent hx of vertigo but reports that this feels different. Pt. Reports hx of migraines but reports that this also feels different. Pt. Denies pain.

## 2014-08-16 NOTE — ED Notes (Signed)
Pt had sudden onset of vertigo and confusion.

## 2014-08-17 ENCOUNTER — Inpatient Hospital Stay (HOSPITAL_COMMUNITY): Payer: Medicare Other

## 2014-08-17 ENCOUNTER — Encounter (HOSPITAL_COMMUNITY): Payer: Self-pay | Admitting: *Deleted

## 2014-08-17 DIAGNOSIS — Z23 Encounter for immunization: Secondary | ICD-10-CM | POA: Diagnosis not present

## 2014-08-17 DIAGNOSIS — G459 Transient cerebral ischemic attack, unspecified: Secondary | ICD-10-CM | POA: Diagnosis not present

## 2014-08-17 DIAGNOSIS — G454 Transient global amnesia: Secondary | ICD-10-CM

## 2014-08-17 DIAGNOSIS — Z794 Long term (current) use of insulin: Secondary | ICD-10-CM | POA: Diagnosis not present

## 2014-08-17 DIAGNOSIS — Z90710 Acquired absence of both cervix and uterus: Secondary | ICD-10-CM | POA: Diagnosis not present

## 2014-08-17 DIAGNOSIS — E108 Type 1 diabetes mellitus with unspecified complications: Secondary | ICD-10-CM

## 2014-08-17 DIAGNOSIS — Z79899 Other long term (current) drug therapy: Secondary | ICD-10-CM | POA: Diagnosis not present

## 2014-08-17 DIAGNOSIS — E785 Hyperlipidemia, unspecified: Secondary | ICD-10-CM | POA: Diagnosis present

## 2014-08-17 DIAGNOSIS — G43909 Migraine, unspecified, not intractable, without status migrainosus: Secondary | ICD-10-CM | POA: Diagnosis present

## 2014-08-17 DIAGNOSIS — Z880 Allergy status to penicillin: Secondary | ICD-10-CM | POA: Diagnosis not present

## 2014-08-17 DIAGNOSIS — Z881 Allergy status to other antibiotic agents status: Secondary | ICD-10-CM | POA: Diagnosis not present

## 2014-08-17 DIAGNOSIS — Z9049 Acquired absence of other specified parts of digestive tract: Secondary | ICD-10-CM | POA: Diagnosis present

## 2014-08-17 DIAGNOSIS — Z885 Allergy status to narcotic agent status: Secondary | ICD-10-CM | POA: Diagnosis not present

## 2014-08-17 DIAGNOSIS — G629 Polyneuropathy, unspecified: Secondary | ICD-10-CM | POA: Diagnosis present

## 2014-08-17 DIAGNOSIS — I959 Hypotension, unspecified: Secondary | ICD-10-CM | POA: Diagnosis present

## 2014-08-17 DIAGNOSIS — I10 Essential (primary) hypertension: Secondary | ICD-10-CM | POA: Diagnosis present

## 2014-08-17 DIAGNOSIS — F329 Major depressive disorder, single episode, unspecified: Secondary | ICD-10-CM | POA: Diagnosis present

## 2014-08-17 DIAGNOSIS — F17210 Nicotine dependence, cigarettes, uncomplicated: Secondary | ICD-10-CM | POA: Diagnosis present

## 2014-08-17 DIAGNOSIS — K449 Diaphragmatic hernia without obstruction or gangrene: Secondary | ICD-10-CM | POA: Diagnosis present

## 2014-08-17 DIAGNOSIS — E1165 Type 2 diabetes mellitus with hyperglycemia: Secondary | ICD-10-CM | POA: Diagnosis present

## 2014-08-17 DIAGNOSIS — Z882 Allergy status to sulfonamides status: Secondary | ICD-10-CM | POA: Diagnosis not present

## 2014-08-17 DIAGNOSIS — K219 Gastro-esophageal reflux disease without esophagitis: Secondary | ICD-10-CM | POA: Diagnosis present

## 2014-08-17 DIAGNOSIS — K589 Irritable bowel syndrome without diarrhea: Secondary | ICD-10-CM | POA: Diagnosis present

## 2014-08-17 DIAGNOSIS — Z720 Tobacco use: Secondary | ICD-10-CM | POA: Insufficient documentation

## 2014-08-17 DIAGNOSIS — R4182 Altered mental status, unspecified: Secondary | ICD-10-CM | POA: Diagnosis present

## 2014-08-17 LAB — CBC WITH DIFFERENTIAL/PLATELET
BASOS ABS: 0.1 10*3/uL (ref 0.0–0.1)
Basophils Relative: 1 % (ref 0–1)
Eosinophils Absolute: 0.4 10*3/uL (ref 0.0–0.7)
Eosinophils Relative: 4 % (ref 0–5)
HEMATOCRIT: 41.4 % (ref 36.0–46.0)
HEMOGLOBIN: 14.2 g/dL (ref 12.0–15.0)
LYMPHS ABS: 4.2 10*3/uL — AB (ref 0.7–4.0)
LYMPHS PCT: 52 % — AB (ref 12–46)
MCH: 31 pg (ref 26.0–34.0)
MCHC: 34.3 g/dL (ref 30.0–36.0)
MCV: 90.4 fL (ref 78.0–100.0)
MONO ABS: 0.6 10*3/uL (ref 0.1–1.0)
Monocytes Relative: 7 % (ref 3–12)
NEUTROS ABS: 2.8 10*3/uL (ref 1.7–7.7)
Neutrophils Relative %: 35 % — ABNORMAL LOW (ref 43–77)
PLATELETS: 269 10*3/uL (ref 150–400)
RBC: 4.58 MIL/uL (ref 3.87–5.11)
RDW: 13 % (ref 11.5–15.5)
WBC: 8 10*3/uL (ref 4.0–10.5)

## 2014-08-17 LAB — CREATININE, SERUM
Creatinine, Ser: 0.8 mg/dL (ref 0.50–1.10)
GFR calc Af Amer: >90 mL/min (ref >90)
GFR calc non Af Amer: 89 mL/min — ABNORMAL LOW (ref >90)

## 2014-08-17 LAB — CBC
HEMATOCRIT: 38.6 % (ref 36.0–46.0)
Hemoglobin: 13.2 g/dL (ref 12.0–15.0)
MCH: 31.1 pg (ref 26.0–34.0)
MCHC: 34.2 g/dL (ref 30.0–36.0)
MCV: 91 fL (ref 78.0–100.0)
Platelets: 232 10*3/uL (ref 150–400)
RBC: 4.24 MIL/uL (ref 3.87–5.11)
RDW: 13.1 % (ref 11.5–15.5)
WBC: 6.6 10*3/uL (ref 4.0–10.5)

## 2014-08-17 LAB — URINALYSIS, ROUTINE W REFLEX MICROSCOPIC
Bilirubin Urine: NEGATIVE
Glucose, UA: NEGATIVE mg/dL
Hgb urine dipstick: NEGATIVE
Ketones, ur: NEGATIVE mg/dL
Leukocytes, UA: NEGATIVE
Nitrite: NEGATIVE
PH: 7 (ref 5.0–8.0)
Protein, ur: NEGATIVE mg/dL
Urobilinogen, UA: 0.2 mg/dL (ref 0.0–1.0)

## 2014-08-17 LAB — COMPREHENSIVE METABOLIC PANEL
ALT: 37 U/L — ABNORMAL HIGH (ref 0–35)
ANION GAP: 5 (ref 5–15)
AST: 27 U/L (ref 0–37)
Albumin: 3.8 g/dL (ref 3.5–5.2)
Alkaline Phosphatase: 100 U/L (ref 39–117)
BUN: 7 mg/dL (ref 6–23)
CHLORIDE: 104 meq/L (ref 96–112)
CO2: 28 mmol/L (ref 19–32)
Calcium: 8.9 mg/dL (ref 8.4–10.5)
Creatinine, Ser: 0.51 mg/dL (ref 0.50–1.10)
GFR calc Af Amer: >90 mL/min (ref >90)
GFR calc non Af Amer: >90 mL/min (ref >90)
GLUCOSE: 71 mg/dL (ref 70–99)
Potassium: 3.7 mmol/L (ref 3.5–5.1)
Sodium: 137 mmol/L (ref 135–145)
Total Bilirubin: 0.3 mg/dL (ref 0.3–1.2)
Total Protein: 6.2 g/dL (ref 6.0–8.3)

## 2014-08-17 LAB — GLUCOSE, CAPILLARY
GLUCOSE-CAPILLARY: 278 mg/dL — AB (ref 70–99)
Glucose-Capillary: 222 mg/dL — ABNORMAL HIGH (ref 70–99)
Glucose-Capillary: 283 mg/dL — ABNORMAL HIGH (ref 70–99)
Glucose-Capillary: 148 mg/dL — ABNORMAL HIGH (ref 70–99)

## 2014-08-17 LAB — CBG MONITORING, ED: Glucose-Capillary: 132 mg/dL — ABNORMAL HIGH (ref 70–99)

## 2014-08-17 LAB — RAPID URINE DRUG SCREEN, HOSP PERFORMED
Amphetamines: NOT DETECTED
Amphetamines: NOT DETECTED
BARBITURATES: NOT DETECTED
Barbiturates: NOT DETECTED
Benzodiazepines: NOT DETECTED
Benzodiazepines: NOT DETECTED
Cocaine: NOT DETECTED
Cocaine: NOT DETECTED
Opiates: NOT DETECTED
Opiates: NOT DETECTED
Tetrahydrocannabinol: NOT DETECTED
Tetrahydrocannabinol: NOT DETECTED

## 2014-08-17 LAB — LIPID PANEL
CHOL/HDL RATIO: 3 ratio
Cholesterol: 162 mg/dL (ref 0–200)
HDL: 54 mg/dL (ref >39)
LDL CALC: 88 mg/dL (ref 0–99)
Triglycerides: 100 mg/dL (ref <150)
VLDL: 20 mg/dL (ref 0–40)

## 2014-08-17 LAB — HEMOGLOBIN A1C
HEMOGLOBIN A1C: 11.5 % — AB (ref <5.7)
Mean Plasma Glucose: 283 mg/dL — ABNORMAL HIGH (ref <117)

## 2014-08-17 LAB — ETHANOL: Alcohol, Ethyl (B): <5 mg/dL (ref 0–9)

## 2014-08-17 MED ORDER — SODIUM CHLORIDE 0.9 % IV BOLUS (SEPSIS)
500.0000 mL | Freq: Once | INTRAVENOUS | Status: AC
  Administered 2014-08-17: 500 mL via INTRAVENOUS

## 2014-08-17 MED ORDER — INSULIN GLARGINE 100 UNIT/ML ~~LOC~~ SOLN
30.0000 [IU] | Freq: Every day | SUBCUTANEOUS | Status: DC
  Filled 2014-08-17: qty 0.3

## 2014-08-17 MED ORDER — ONDANSETRON HCL 4 MG/2ML IJ SOLN
4.0000 mg | Freq: Once | INTRAMUSCULAR | Status: AC
  Administered 2014-08-17: 4 mg via INTRAVENOUS
  Filled 2014-08-17: qty 2

## 2014-08-17 MED ORDER — KETOROLAC TROMETHAMINE 30 MG/ML IJ SOLN
30.0000 mg | Freq: Once | INTRAMUSCULAR | Status: AC
  Administered 2014-08-17: 30 mg via INTRAVENOUS
  Filled 2014-08-17: qty 1

## 2014-08-17 MED ORDER — ZOLPIDEM TARTRATE 5 MG PO TABS
5.0000 mg | ORAL_TABLET | Freq: Every day | ORAL | Status: DC
  Administered 2014-08-17: 5 mg via ORAL
  Filled 2014-08-17: qty 1

## 2014-08-17 MED ORDER — ACETAMINOPHEN 325 MG PO TABS
650.0000 mg | ORAL_TABLET | Freq: Four times a day (QID) | ORAL | Status: DC | PRN
  Administered 2014-08-17 – 2014-08-18 (×2): 650 mg via ORAL
  Filled 2014-08-17 (×2): qty 2

## 2014-08-17 MED ORDER — STROKE: EARLY STAGES OF RECOVERY BOOK
Freq: Once | Status: AC
  Administered 2014-08-17: 05:00:00

## 2014-08-17 MED ORDER — SODIUM CHLORIDE 0.9 % IV SOLN
INTRAVENOUS | Status: DC
  Administered 2014-08-17 – 2014-08-18 (×3): via INTRAVENOUS

## 2014-08-17 MED ORDER — ASPIRIN 325 MG PO TABS
325.0000 mg | ORAL_TABLET | Freq: Once | ORAL | Status: AC
  Administered 2014-08-17: 325 mg via ORAL
  Filled 2014-08-17: qty 1

## 2014-08-17 MED ORDER — HYDROCODONE-ACETAMINOPHEN 7.5-325 MG PO TABS
1.0000 | ORAL_TABLET | Freq: Three times a day (TID) | ORAL | Status: DC | PRN
Start: 2014-08-17 — End: 2014-08-18
  Administered 2014-08-17 – 2014-08-18 (×3): 1 via ORAL
  Filled 2014-08-17 (×4): qty 1

## 2014-08-17 MED ORDER — SIMVASTATIN 20 MG PO TABS
20.0000 mg | ORAL_TABLET | Freq: Every day | ORAL | Status: DC
  Administered 2014-08-17: 20 mg via ORAL
  Filled 2014-08-17: qty 1

## 2014-08-17 MED ORDER — DIPHENHYDRAMINE HCL 50 MG/ML IJ SOLN
25.0000 mg | Freq: Once | INTRAMUSCULAR | Status: AC
  Administered 2014-08-17: 25 mg via INTRAVENOUS
  Filled 2014-08-17: qty 1

## 2014-08-17 MED ORDER — ALPRAZOLAM 0.5 MG PO TABS
0.5000 mg | ORAL_TABLET | Freq: Three times a day (TID) | ORAL | Status: DC | PRN
  Administered 2014-08-17: 0.5 mg via ORAL
  Filled 2014-08-17: qty 1

## 2014-08-17 MED ORDER — INSULIN ASPART 100 UNIT/ML ~~LOC~~ SOLN
0.0000 [IU] | Freq: Three times a day (TID) | SUBCUTANEOUS | Status: DC
  Administered 2014-08-17: 8 [IU] via SUBCUTANEOUS
  Administered 2014-08-17: 5 [IU] via SUBCUTANEOUS
  Administered 2014-08-17: 8 [IU] via SUBCUTANEOUS

## 2014-08-17 MED ORDER — METOCLOPRAMIDE HCL 5 MG/ML IJ SOLN
10.0000 mg | Freq: Once | INTRAMUSCULAR | Status: AC
  Administered 2014-08-17: 10 mg via INTRAVENOUS
  Filled 2014-08-17: qty 2

## 2014-08-17 MED ORDER — GABAPENTIN 400 MG PO CAPS
400.0000 mg | ORAL_CAPSULE | Freq: Four times a day (QID) | ORAL | Status: DC
  Administered 2014-08-17 – 2014-08-18 (×6): 400 mg via ORAL
  Filled 2014-08-17 (×6): qty 1

## 2014-08-17 MED ORDER — ENSURE COMPLETE PO LIQD
237.0000 mL | Freq: Two times a day (BID) | ORAL | Status: DC
  Administered 2014-08-17: 237 mL via ORAL

## 2014-08-17 MED ORDER — SODIUM CHLORIDE 0.9 % IV SOLN
INTRAVENOUS | Status: DC
  Administered 2014-08-17: 03:00:00 via INTRAVENOUS

## 2014-08-17 MED ORDER — INSULIN ASPART 100 UNIT/ML ~~LOC~~ SOLN: 0.0000 [IU] | Freq: Three times a day (TID) | SUBCUTANEOUS | Status: DC

## 2014-08-17 MED ORDER — PNEUMOCOCCAL VAC POLYVALENT 25 MCG/0.5ML IJ INJ
0.5000 mL | INJECTION | INTRAMUSCULAR | Status: AC
  Administered 2014-08-18: 0.5 mL via INTRAMUSCULAR
  Filled 2014-08-17: qty 0.5

## 2014-08-17 MED ORDER — ENOXAPARIN SODIUM 40 MG/0.4ML ~~LOC~~ SOLN
40.0000 mg | SUBCUTANEOUS | Status: DC
  Administered 2014-08-17 – 2014-08-18 (×2): 40 mg via SUBCUTANEOUS
  Filled 2014-08-17 (×2): qty 0.4

## 2014-08-17 MED ORDER — ASPIRIN 325 MG PO TABS
325.0000 mg | ORAL_TABLET | Freq: Every day | ORAL | Status: DC
  Administered 2014-08-17 – 2014-08-18 (×2): 325 mg via ORAL
  Filled 2014-08-17 (×2): qty 1

## 2014-08-17 NOTE — Progress Notes (Signed)
INITIAL NUTRITION ASSESSMENT  DOCUMENTATION CODES Per approved criteria  -Not Applicable   INTERVENTION: Continue Ensure Complete po BID, each supplement provides 350 kcal and 13 grams of protein  NUTRITION DIAGNOSIS: Inadequate oral intake related to multiple tests as evidenced by pt out of room for meal.   Goal: Pt to meet >/= 90% of their estimated nutrition needs   Monitor:  PO intake, supplement acceptance, weight trends  Reason for Assessment: Pt identified as at nutrition risk on the Malnutrition Screen Tool  44 y.o. female  Admitting Dx: TIA (transient ischemic attack)  ASSESSMENT: Pt with hx of DM, depression admitted with TIA, stroke work up in progress.  Pt currently in MRI.   Height: Ht Readings from Last 1 Encounters:  08/17/14 5\' 5"  (1.651 m)    Weight: Wt Readings from Last 1 Encounters:  08/17/14 161 lb 3.2 oz (73.12 kg)    Ideal Body Weight: 56.8 kg   % Ideal Body Weight: 129%  Wt Readings from Last 10 Encounters:  08/17/14 161 lb 3.2 oz (73.12 kg)  04/16/14 143 lb (64.864 kg)  10/07/12 160 lb (72.576 kg)    Usual Body Weight: unknown  % Usual Body Weight: -  BMI:  Body mass index is 26.83 kg/(m^2).  Estimated Nutritional Needs: Kcal: 1600-1800 Protein: 70-85 grams Fluid: > 1.6 L/day  Skin: WDL   Diet Order: Diet Carb Modified  EDUCATION NEEDS: -No education needs identified at this time  No intake or output data in the 24 hours ending 08/17/14 1043  Last BM: PTA   Labs:   Recent Labs Lab 08/16/14 2347 08/17/14 0552  NA 137  --   K 3.7  --   CL 104  --   CO2 28  --   BUN 7  --   CREATININE 0.51 0.80  CALCIUM 8.9  --   GLUCOSE 71  --     CBG (last 3)   Recent Labs  08/17/14 0421 08/17/14 0658  GLUCAP 132* 278*    Scheduled Meds: . aspirin  325 mg Oral Daily  . enoxaparin (LOVENOX) injection  40 mg Subcutaneous Q24H  . feeding supplement (ENSURE COMPLETE)  237 mL Oral BID BM  . gabapentin  400 mg Oral  QID  . insulin aspart  0-15 Units Subcutaneous TID WC  . [START ON 08/18/2014] insulin glargine  30 Units Subcutaneous QHS  . simvastatin  20 mg Oral QHS    Continuous Infusions: . sodium chloride 100 mL/hr at 08/17/14 0543    Past Medical History  Diagnosis Date  . Diabetes mellitus without complication   . Neuropathy   . Depression   . GERD (gastroesophageal reflux disease)   . Hiatal hernia   . Migraine headache   . IBS (irritable bowel syndrome)   . Vertigo   . Hypertension   . Hyperlipidemia     Past Surgical History  Procedure Laterality Date  . Cesarean section    . Tubal ligation    . Abdominal hysterectomy    . Appendectomy    . Cholecystectomy    . Cyst excision      right breast  . Cardiac catheterization  2006    normal coronary arteries    Maylon Peppers RD, LDN, [place] Pager [phone] After Hours Pager

## 2014-08-17 NOTE — ED Notes (Signed)
Pt. C/o nausea. EDP notified. Verbal order given.

## 2014-08-17 NOTE — Evaluation (Signed)
Physical Therapy Evaluation Patient Details Name: [name] MRN: [id number] DOB: 1971-02-05 Today's Date: 08/17/2014   History of Present Illness  Pt admit with possible TIA.  Work up underway.  Positive for right vestibular hypofunction.   Clinical Impression  Pt admitted with above diagnosis. Pt currently with functional limitations due to the deficits listed below (see PT Problem List). Pt should progress and be able to go home with use of cane and Outpt PT f/u for vestibular rehab.  Pt understands exercise progression.   Pt will benefit from skilled PT to increase their independence and safety with mobility to allow discharge to the venue listed below.      Follow Up Recommendations Outpatient PT;Supervision - Intermittent (for vestibular rehab; ENT c/s prn if symptoms persist)    Equipment Recommendations  Cane    Recommendations for Other Services       Precautions / Restrictions Precautions Precautions: Fall Restrictions Weight Bearing Restrictions: No      Mobility  Bed Mobility Overal bed mobility: Independent                Transfers Overall transfer level: Independent                  Ambulation/Gait Ambulation/Gait assistance: Supervision Ambulation Distance (Feet): 450 Feet Assistive device: None Gait Pattern/deviations: Step-through pattern;Decreased stride length;Drifts right/left   Gait velocity interpretation: <1.8 ft/sec, indicative of risk for recurrent falls General Gait Details: Pt overall with steady gait with ability to ambulate without device in controlled environment without LOB.    Stairs Stairs: Yes Stairs assistance: Min guard Stair Management: No rails;Forwards;Step to pattern;Alternating pattern Number of Stairs: 4 General stair comments: Educated pt to use step to pattern for safety and use cane for incr support on steps as she lost her balance at end of steps but was able to self correct.    Wheelchair  Mobility    Modified Rankin (Stroke Patients Only)       Balance Overall balance assessment: Needs assistance Sitting-balance support: No upper extremity supported;Feet supported Sitting balance-Leahy Scale: Good     Standing balance support: No upper extremity supported;During functional activity Standing balance-Leahy Scale: Fair Standing balance comment: can stand statically and can accept min challenges to balance.               High level balance activites: Direction changes;Turns;Sudden stops;Head turns High Level Balance Comments: Pt needs min guard assist with challenges as she drifts and has to self correct balance.  Recommend cane for home use until vertigo improves.   Standardized Balance Assessment Standardized Balance Assessment : Dynamic Gait Index   Dynamic Gait Index Level Surface: Normal Change in Gait Speed: Normal Gait with Horizontal Head Turns: Mild Impairment Gait with Vertical Head Turns: Mild Impairment Gait and Pivot Turn: Mild Impairment Step Over Obstacle: Normal Step Around Obstacles: Normal Steps: Mild Impairment Total Score: 20       Pertinent Vitals/Pain Pain Assessment: 0-10 Pain Score: 4  Pain Location: head Pain Descriptors / Indicators: Aching Pain Intervention(s): Limited activity within patient's tolerance;Monitored during session;Premedicated before session;Repositioned    Home Living Family/patient expects to be discharged to:: Private residence Living Arrangements: Alone Available Help at Discharge: Family;Available PRN/intermittently Type of Home: House Home Access: Stairs to enter Entrance Stairs-Rails: None Entrance Stairs-Number of Steps: 3 Home Layout: One level Home Equipment: None Additional Comments: Pt on disability.      Prior Function Level of Independence: Independent  Hand Dominance        Extremity/Trunk Assessment   Upper Extremity Assessment: Overall WFL for tasks assessed            Lower Extremity Assessment: Overall WFL for tasks assessed      Cervical / Trunk Assessment: Normal  Communication   Communication: No difficulties  Cognition Arousal/Alertness: Awake/alert Behavior During Therapy: WFL for tasks assessed/performed Overall Cognitive Status: Within Functional Limits for tasks assessed                      General Comments General comments (skin integrity, edema, etc.): Scored 20/24 on DGI with risk of falls with high level activities.   Educatedto use cane for added stability.  Also tested for BPPV with testing  negative.  Then tested via head thrust with positive head thrust right suggesting right hypofunction.  Intiated x1 exercises.      Exercises Other Exercises Other Exercises: x1 exercises x 3 x 3 x day.  Pt can perform up to 30 seconds and has to rest.  Gave progression to include standing and walking.  Pt educated in how to perform progression safely and recommended she attend Outpt therapy for maintenance of progression.  Pt verbalizes understanding.       Assessment/Plan    PT Assessment Patient needs continued PT services  PT Diagnosis Difficulty walking (dizziness)   PT Problem List Decreased activity tolerance;Decreased balance;Decreased mobility;Decreased knowledge of use of DME;Decreased safety awareness;Decreased knowledge of precautions;Decreased coordination (dizziness)  PT Treatment Interventions DME instruction;Gait training;Functional mobility training;Therapeutic activities;Therapeutic exercise;Balance training;Patient/family education (gaze stability exercises)   PT Goals (Current goals can be found in the Care Plan section) Acute Rehab PT Goals Patient Stated Goal: to go home PT Goal Formulation: With patient Time For Goal Achievement: 08/24/14 Potential to Achieve Goals: Good    Frequency Min 3X/week   Barriers to discharge Decreased caregiver support      Co-evaluation               End  of Session Equipment Utilized During Treatment: Gait belt Activity Tolerance: Patient tolerated treatment well (limited by dizziness ) Patient left: in chair;with call bell/phone within reach;with family/visitor present Nurse Communication: Mobility status         Time: 9024-0973 PT Time Calculation (min) (ACUTE ONLY): 52 min   Charges:   PT Evaluation $Initial PT Evaluation Tier I: 1 Procedure PT Treatments $Gait Training: 8-22 mins $Therapeutic Exercise: 8-22 mins $Physical Performance Test: 8-22 mins   PT G CodesDenice Paradise 09-03-2014, 4:37 PM [facility] Acute Rehabilitation [phone] (pager)

## 2014-08-17 NOTE — Progress Notes (Signed)
OT Cancellation Note  Patient Details Name: [name] MRN: [id number] DOB: 02-25-1971   Cancelled Treatment:    Reason Eval/Treat Not Completed: OT screened, no needs identified, will sign off. Per PT, AMS has resolved and no OT needs. Acute OT to sign off.   Villa Herb M   Cyndie Chime, OTR/L Occupational Therapist [phone] (pager)  08/17/2014, 3:33 PM

## 2014-08-17 NOTE — ED Provider Notes (Signed)
CSN: [id number]     Arrival date & time 08/16/14  2230 History   First MD Initiated Contact with Patient 08/16/14 2316     Chief Complaint  Patient presents with  . Altered Mental Status  . Dizziness      HPI Pt was seen at 2325.  Per pt, c/o gradual onset and persistence of constant AMS that began approximately 2000 PTA. Pt states she laid down to take a nap at 1700, and woke up at 2000 "confused." Pt states she "didn't know the day" and "didn't know why it was dark outside." Pt states she was able to call her father during this episode.  Pt's father states pt was "talking gibberish" and "talking out of her head." Pt states she "still feels confused about things." Pt also c/o sudden onset and persistence of constant "dizziness" that began PTA. Pt describes the "dizziness" as "everything is spinning around" and "my vertigo." Has been associated with nausea. Denies slurred speech, no falls, no syncope, no facial droop, no visual changes, no focal motor weakness, no tingling/numbness in extremities, no CP/palpitations, no SOB/cough, no abd pain, no vomiting/diarrhea, no back or neck pain.      Past Medical History  Diagnosis Date  . Diabetes mellitus without complication   . Neuropathy   . Depression   . GERD (gastroesophageal reflux disease)   . Hiatal hernia   . Migraine headache   . IBS (irritable bowel syndrome)   . Vertigo   . Hypertension   . Hyperlipidemia    Past Surgical History  Procedure Laterality Date  . Cesarean section    . Tubal ligation    . Abdominal hysterectomy    . Appendectomy    . Cholecystectomy    . Cyst excision      right breast  . Cardiac catheterization  2006    normal coronary arteries   Family History  Problem Relation Age of Onset  . Stroke Mother 28  . Heart attack Mother 77   History  Substance Use Topics  . Smoking status: Current Every Day Smoker  . Smokeless tobacco: Not on file  . Alcohol Use: No    Review of Systems ROS:  Statement: All systems negative except as marked or noted in the HPI; Constitutional: Negative for fever and chills. ; ; Eyes: Negative for eye pain, redness and discharge. ; ; ENMT: Negative for ear pain, hoarseness, nasal congestion, sinus pressure and sore throat. ; ; Cardiovascular: Negative for chest pain, palpitations, diaphoresis, dyspnea and peripheral edema. ; ; Respiratory: Negative for cough, wheezing and stridor. ; ; Gastrointestinal: +nausea. Negative for vomiting, diarrhea, abdominal pain, blood in stool, hematemesis, jaundice and rectal bleeding. . ; ; Genitourinary: Negative for dysuria, flank pain and hematuria. ; ; Musculoskeletal: Negative for back pain and neck pain. Negative for swelling and trauma.; ; Skin: Negative for pruritus, rash, abrasions, blisters, bruising and skin lesion.; ; Neuro: +confusion, "dizziness." Negative for headache, lightheadedness and neck stiffness. Negative for weakness, altered level of consciousness, extremity weakness, paresthesias, involuntary movement, seizure and syncope.     Allergies  Clindamycin/lincomycin; Codeine; Penicillins; and Sulfa antibiotics  Home Medications   Prior to Admission medications   Medication Sig Start Date End Date Taking? Authorizing Provider  ALPRAZolam Duanne Moron) 0.5 MG tablet Take 0.5 mg by mouth 3 (three) times daily as needed for sleep or anxiety.    Historical Provider, MD  docusate sodium (COLACE) 100 MG capsule Take 100 mg by mouth 2 (two) times daily.  Historical Provider, MD  estradiol (ESTRACE) 0.5 MG tablet Take 0.5 mg by mouth every morning.    Historical Provider, MD  gabapentin (NEURONTIN) 400 MG capsule Take 400 mg by mouth 4 (four) times daily.    Historical Provider, MD  HYDROcodone-acetaminophen (NORCO) 7.5-325 MG per tablet Take 1 tablet by mouth every 8 (eight) hours as needed for pain.    Historical Provider, MD  insulin glargine (LANTUS SOLOSTAR) 100 UNIT/ML injection Inject 30 Units into the skin at  bedtime.    Historical Provider, MD  insulin lispro protamine-insulin lispro (HUMALOG 75/25) (75-25) 100 UNIT/ML SUSP Inject 12 Units into the skin every morning.     Historical Provider, MD  lisinopril (PRINIVIL,ZESTRIL) 5 MG tablet Take 5 mg by mouth every morning.    Historical Provider, MD  Multiple Vitamin (MULTIVITAMIN WITH MINERALS) TABS Take 1 tablet by mouth every morning.    Historical Provider, MD  polycarbophil (FIBERCON) 625 MG tablet Take 625 mg by mouth 2 (two) times daily.    Historical Provider, MD  simvastatin (ZOCOR) 20 MG tablet Take 20 mg by mouth at bedtime.    Historical Provider, MD  traMADol (ULTRAM) 50 MG tablet Take 1 tablet (50 mg total) by mouth every 6 (six) hours as needed for pain. 10/07/12   Fredia Sorrow, MD   BP 115/72 mmHg  Pulse 84  Temp(Src) 97.9 F (36.6 C) (Oral)  Resp 15  Ht 5\' 5"  (1.651 m)  Wt 145 lb (65.772 kg)  BMI 24.13 kg/m2  SpO2 96% Physical Exam 2330: Physical examination:  Nursing notes reviewed; Vital signs and O2 SAT reviewed;  Constitutional: Well developed, Well nourished, Well hydrated, In no acute distress; Head:  Normocephalic, atraumatic; Eyes: EOMI, PERRL, No scleral icterus; ENMT: TM's clear bilat. +edemetous nasal turbinates bilat with clear rhinorrhea. Mouth and pharynx normal, Mucous membranes moist; Neck: Supple, Full range of motion, No lymphadenopathy; Cardiovascular: Regular rate and rhythm, No murmur, rub, or gallop; Respiratory: Breath sounds clear & equal bilaterally, No rales, rhonchi, wheezes.  Speaking full sentences with ease, Normal respiratory effort/excursion; Chest: Nontender, Movement normal; Abdomen: Soft, Nontender, Nondistended, Normal bowel sounds; Genitourinary: No CVA tenderness; Extremities: Pulses normal, No tenderness, No edema, No calf edema or asymmetry.; Neuro: AA&Ox3, mildly confused regarding events PTA. Major CN grossly intact. Speech clear.  No facial droop. +left horizontal end gaze fatigable nystagmus  that reproduces pt's "dizziness." Grips equal. Strength 5/5 equal bilat UE's and LE's.  DTR 2/4 equal bilat UE's and LE's.  No gross sensory deficits.  Normal cerebellar testing bilat UE's (finger-nose) and LE's (heel-shin)..; Skin: Color normal, Warm, Dry.   ED Course  Procedures     EKG Interpretation None      MDM  MDM Reviewed: previous chart, nursing note and vitals Reviewed previous: labs and ECG Interpretation: labs, CT scan and ECG    Date: 08/17/2014 0250  Rate: 74  Rhythm: normal sinus rhythm  QRS Axis: normal  Intervals: normal  ST/T Wave abnormalities: normal  Conduction Disutrbances:none  Narrative Interpretation:   Old EKG Reviewed: unchanged; no significant changes from previous EKG dated 08/09/2004.   Results for orders placed or performed during the hospital encounter of 08/16/14  Urinalysis, Routine w reflex microscopic  Result Value Ref Range   Color, Urine YELLOW YELLOW   APPearance CLEAR CLEAR   Specific Gravity, Urine <1.005 (L) 1.005 - 1.030   pH 7.0 5.0 - 8.0   Glucose, UA NEGATIVE NEGATIVE mg/dL   Hgb urine dipstick NEGATIVE NEGATIVE  Bilirubin Urine NEGATIVE NEGATIVE   Ketones, ur NEGATIVE NEGATIVE mg/dL   Protein, ur NEGATIVE NEGATIVE mg/dL   Urobilinogen, UA 0.2 0.0 - 1.0 mg/dL   Nitrite NEGATIVE NEGATIVE   Leukocytes, UA NEGATIVE NEGATIVE  CBC with Differential  Result Value Ref Range   WBC 8.0 4.0 - 10.5 K/uL   RBC 4.58 3.87 - 5.11 MIL/uL   Hemoglobin 14.2 12.0 - 15.0 g/dL   HCT 41.4 36.0 - 46.0 %   MCV 90.4 78.0 - 100.0 fL   MCH 31.0 26.0 - 34.0 pg   MCHC 34.3 30.0 - 36.0 g/dL   RDW 13.0 11.5 - 15.5 %   Platelets 269 150 - 400 K/uL   Neutrophils Relative % 35 (L) 43 - 77 %   Neutro Abs 2.8 1.7 - 7.7 K/uL   Lymphocytes Relative 52 (H) 12 - 46 %   Lymphs Abs 4.2 (H) 0.7 - 4.0 K/uL   Monocytes Relative 7 3 - 12 %   Monocytes Absolute 0.6 0.1 - 1.0 K/uL   Eosinophils Relative 4 0 - 5 %   Eosinophils Absolute 0.4 0.0 - 0.7 K/uL    Basophils Relative 1 0 - 1 %   Basophils Absolute 0.1 0.0 - 0.1 K/uL  Comprehensive metabolic panel  Result Value Ref Range   Sodium 137 135 - 145 mmol/L   Potassium 3.7 3.5 - 5.1 mmol/L   Chloride 104 96 - 112 mEq/L   CO2 28 19 - 32 mmol/L   Glucose, Bld 71 70 - 99 mg/dL   BUN 7 6 - 23 mg/dL   Creatinine, Ser 0.51 0.50 - 1.10 mg/dL   Calcium 8.9 8.4 - 10.5 mg/dL   Total Protein 6.2 6.0 - 8.3 g/dL   Albumin 3.8 3.5 - 5.2 g/dL   AST 27 0 - 37 U/L   ALT 37 (H) 0 - 35 U/L   Alkaline Phosphatase 100 39 - 117 U/L   Total Bilirubin 0.3 0.3 - 1.2 mg/dL   GFR calc non Af Amer >90 >90 mL/min   GFR calc Af Amer >90 >90 mL/min   Anion gap 5 5 - 15  Urine rapid drug screen (hosp performed)  Result Value Ref Range   Opiates NONE DETECTED NONE DETECTED   Cocaine NONE DETECTED NONE DETECTED   Benzodiazepines NONE DETECTED NONE DETECTED   Amphetamines NONE DETECTED NONE DETECTED   Tetrahydrocannabinol NONE DETECTED NONE DETECTED   Barbiturates NONE DETECTED NONE DETECTED  Ethanol  Result Value Ref Range   Alcohol, Ethyl (B) <5 0 - 9 mg/dL   Ct Head Wo Contrast 08/17/2014   CLINICAL DATA:  Acute onset of dizziness and confusion. Initial encounter.  EXAM: CT HEAD WITHOUT CONTRAST  TECHNIQUE: Contiguous axial images were obtained from the base of the skull through the vertex without intravenous contrast.  COMPARISON:  CT of the head performed 08/03/2014  FINDINGS: There is no evidence of acute infarction, or intra- or extra-axial hemorrhage on CT.  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  The posterior fossa, including the cerebellum, brainstem and fourth ventricle, is within normal limits. The third and lateral ventricles, and basal ganglia are unremarkable in appearance. The cerebral hemispheres are symmetric in appearance, with normal gray-white differentiation. No mass effect or midline shift is seen.  There is no evidence of fracture; visualized osseous  structures are unremarkable in appearance. The visualized portions of the orbits are within normal limits. The paranasal sinuses and mastoid  air cells are well-aerated. No significant soft tissue abnormalities are seen.  IMPRESSION: 1. No acute intracranial pathology seen on CT. 2. 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   Electronically Signed   By: Garald Balding M.D.   On: 08/17/2014 00:41     0150:  Pt orthostatic on VS; IVF bolus and gtt started. Pt denies any improvement of her vertigo after PO antivert and IV zofran. Now pt states she is "starting to get one of my migraines" and is requesting "something for it." Denies any change from her usual chronic headache pain pattern. Will dose migraine cocktail.  Concern regarding continuation of vertigo despite treatment, as well as pt's FHx early MI and CVA. Will need observation admit for MRI brain to r/o CVA.  T/C to [facility] Dr. Darrick Meigs, case discussed, including:  HPI, pertinent PM/SHx, VS/PE, dx testing, ED course and treatment:  Agreeable to observation admit, states pt will need to be transferred to [facility] due to lack of MRI at [facility] on weekends. Pt agreeable with this plan.        Francine Graven, DO 08/19/14 [id number]

## 2014-08-17 NOTE — Progress Notes (Signed)
Pt. Arrived via [facility] personnel with VS stable. Will continue to monitor. Joaquin Bend E, RN 08/17/2014 0500

## 2014-08-17 NOTE — Progress Notes (Signed)
Called and received report from [place]. [name], [place] 08/17/2014 2:55 AM

## 2014-08-17 NOTE — H&P (Signed)
PCP:   HASANAJ,XAJE A, MD   Chief Complaint:  Altered mental status  HPI: 50 rolled female who   has a past medical history of Diabetes mellitus without complication; Neuropathy; Depression; GERD (gastroesophageal reflux disease); Hiatal hernia; Migraine headache; IBS (irritable bowel syndrome); Vertigo; Hypertension; and Hyperlipidemia. Today came to the hospital with chief complaint of altered mental status which began around 8 PM last night. Patient says that she laid down to take a nap at 5 PM and woke up at 8 PM confused at that time she did no the time of the day and she thought it was morning. Patient took her morning medications and then called her dad who felt that patient was " talking gibberish ". Patient also complained of right-sided headache along with dizziness. She also had nausea but no vomiting no diarrhea. No syncope no seizure activity no facial droop no visual changes no focal motor weakness. Patient denies chest pain no shortness of breath. She has a history of diabetes mellitus and takes Lantus at home. She took Lantus last night, does not remember after or before 8 PM. Patient continued to complain of dizziness in the ED CT head was done which was negative for stroke. She denies dysuria, urgency,  frequency of urination.  Allergies:   Allergies  Allergen Reactions  . Clindamycin/Lincomycin Rash  . Codeine Rash  . Penicillins Rash  . Sulfa Antibiotics Rash           Past Medical History  Diagnosis Date  . Diabetes mellitus without complication   . Neuropathy   . Depression   . GERD (gastroesophageal reflux disease)   . Hiatal hernia   . Migraine headache   . IBS (irritable bowel syndrome)   . Vertigo   . Hypertension   . Hyperlipidemia     Past Surgical History  Procedure Laterality Date  . Cesarean section    . Tubal ligation    . Abdominal hysterectomy    . Appendectomy    . Cholecystectomy    . Cyst excision      right breast  . Cardiac  catheterization  2006    normal coronary arteries    Prior to Admission medications   Medication Sig Start Date End Date Taking? Authorizing Provider  ALPRAZolam Duanne Moron) 0.5 MG tablet Take 0.5 mg by mouth 3 (three) times daily as needed for sleep or anxiety.    Historical Provider, MD  docusate sodium (COLACE) 100 MG capsule Take 100 mg by mouth 2 (two) times daily.    Historical Provider, MD  estradiol (ESTRACE) 0.5 MG tablet Take 0.5 mg by mouth every morning.    Historical Provider, MD  gabapentin (NEURONTIN) 400 MG capsule Take 400 mg by mouth 4 (four) times daily.    Historical Provider, MD  HYDROcodone-acetaminophen (NORCO) 7.5-325 MG per tablet Take 1 tablet by mouth every 8 (eight) hours as needed for pain.    Historical Provider, MD  insulin glargine (LANTUS SOLOSTAR) 100 UNIT/ML injection Inject 30 Units into the skin at bedtime.    Historical Provider, MD  insulin lispro protamine-insulin lispro (HUMALOG 75/25) (75-25) 100 UNIT/ML SUSP Inject 12 Units into the skin every morning.     Historical Provider, MD  lisinopril (PRINIVIL,ZESTRIL) 5 MG tablet Take 5 mg by mouth every morning.    Historical Provider, MD  Multiple Vitamin (MULTIVITAMIN WITH MINERALS) TABS Take 1 tablet by mouth every morning.    Historical Provider, MD  polycarbophil (FIBERCON) 625 MG tablet Take 625  mg by mouth 2 (two) times daily.    Historical Provider, MD  simvastatin (ZOCOR) 20 MG tablet Take 20 mg by mouth at bedtime.    Historical Provider, MD  traMADol (ULTRAM) 50 MG tablet Take 1 tablet (50 mg total) by mouth every 6 (six) hours as needed for pain. 10/07/12   Fredia Sorrow, MD    Social History:  reports that she has been smoking.  She does not have any smokeless tobacco history on file. She reports that she does not drink alcohol or use illicit drugs.  Family History  Problem Relation Age of Onset  . Stroke Mother 16  . Heart attack Mother 63     All the positives are listed in BOLD  Review  of Systems:  HEENT: Headache, blurred vision, runny nose, sore throat Neck: Hypothyroidism, hyperthyroidism,,lymphadenopathy Chest : Shortness of breath, history of COPD, Asthma Heart : Chest pain, history of coronary arterey disease GI:  Nausea, vomiting, diarrhea, constipation, GERD GU: Dysuria, urgency, frequency of urination, hematuria Neuro: Stroke, seizures, syncope Psych: Depression, anxiety, hallucinations   Physical Exam: Blood pressure 115/72, pulse 84, temperature 97.9 F (36.6 C), temperature source Oral, resp. rate 15, height 5\' 5"  (1.651 m), weight 65.772 kg (145 lb), SpO2 96 %. Constitutional:   Patient is a well-developed and well-nourished female in no acute distress and cooperative with exam. Head: Normocephalic and atraumatic Mouth: Mucus membranes moist Eyes: PERRL, EOMI, conjunctivae normal Neck: Supple, No Thyromegaly Cardiovascular: RRR, S1 normal, S2 normal Pulmonary/Chest: CTAB, no wheezes, rales, or rhonchi Abdominal: Soft. Non-tender, non-distended, bowel sounds are normal, no masses, organomegaly, or guarding present.  Neurological: A&O x3, Strength is normal and symmetric bilaterally, cranial nerve II-XII are grossly intact, no focal motor deficit, reduced sensory on left side to light touch. Extremities : No Cyanosis, Clubbing or Edema  Labs on Admission:  Basic Metabolic Panel:  Recent Labs Lab 08/16/14 2347  NA 137  K 3.7  CL 104  CO2 28  GLUCOSE 71  BUN 7  CREATININE 0.51  CALCIUM 8.9   Liver Function Tests:  Recent Labs Lab 08/16/14 2347  AST 27  ALT 37*  ALKPHOS 100  BILITOT 0.3  PROT 6.2  ALBUMIN 3.8   No results for input(s): LIPASE, AMYLASE in the last 168 hours. No results for input(s): AMMONIA in the last 168 hours. CBC:  Recent Labs Lab 08/16/14 2347  WBC 8.0  NEUTROABS 2.8  HGB 14.2  HCT 41.4  MCV 90.4  PLT 269    Radiological Exams on Admission: Ct Head Wo Contrast  08/17/2014   CLINICAL DATA:  Acute onset  of dizziness and confusion. Initial encounter.  EXAM: CT HEAD WITHOUT CONTRAST  TECHNIQUE: Contiguous axial images were obtained from the base of the skull through the vertex without intravenous contrast.  COMPARISON:  CT of the head performed 08/03/2014  FINDINGS: There is no evidence of acute infarction, or intra- or extra-axial hemorrhage on CT.  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  The posterior fossa, including the cerebellum, brainstem and fourth ventricle, is within normal limits. The third and lateral ventricles, and basal ganglia are unremarkable in appearance. The cerebral hemispheres are symmetric in appearance, with normal gray-white differentiation. No mass effect or midline shift is seen.  There is no evidence of fracture; visualized osseous structures are unremarkable in appearance. The visualized portions of the orbits are within normal limits. The paranasal sinuses and mastoid air cells are well-aerated. No  significant soft tissue abnormalities are seen.  IMPRESSION: 1. No acute intracranial pathology seen on CT. 2. 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   Electronically Signed   By: Garald Balding M.D.   On: 08/17/2014 00:41     Assessment/Plan Principal Problem:   TIA (transient ischemic attack) Active Problems:   DM (diabetes mellitus)  TIA versus stroke We'll admit the patient to telemetry, initiate stroke workup including MRI/MRA brain, carotid duplex, fasting lipid profile, him an A1c. CT head was done the ED which was negative. Patient will be transferred to [facility] as MRI is not available over the weekends. Will give aspirin 325 mg by mouth daily.  Diabetes mellitus Continue Lantus 30 units at bedtime, and initiate sliding scale insulin with NovoLog.  History of hypertension Hold lisinopril and blood pressure is soft.   Migraine Continue when necessary Vicodin.  DVT prophylaxis    Lovenox  Code status: Full code  Family discussion: No family at bedside   Time Spent on Admission: 60 minutes  [facility] Pager: [phone] 08/17/2014, 2:30 AM  If 7PM-7AM, please contact night-coverage  [email or web address]  Password [email or web address]

## 2014-08-17 NOTE — Progress Notes (Signed)
PROGRESS NOTE  [name] ZLD:[id number] DOB: 03-04-1971 DOA: 08/16/2014 PCP: Neale Burly, MD  24 rolled female who has a past medical history of Diabetes mellitus, Neuropathy; Depression; GERD  Migraine headache; IBS; Vertigo; Hypertension; and Hyperlipidemia.  Presented to [facility] ER for altered mental status which began around 8 PM last night. Patient says that she laid down to take a nap at 5 PM and woke up at 8 PM confused at that time she did no the time of the day and she thought it was morning. Patient took her morning medications and then called her dad who felt that patient was " talking gibberish ". Patient also complained of right-sided headache along with dizziness. She also had nausea but no vomiting no diarrhea. No syncope no seizure activity no facial droop no visual changes no focal motor weakness. Patient denies chest pain no shortness of breath. She has a history of diabetes mellitus and takes Lantus at home.   Assessment/Plan:  Altered mental status Differential includes TIA, Medication related, Hyper/Hypoglycemia, atypical migraine.  She takes Xanax, Gabapentin, and Norco at home all of which could have contributed to her symptoms.  Further she reports having CBGs of 27 and 600+.  These could have contributed as well.  CT Head and MR Brain are negative.  2D echo and carotid dopplers are pending.  Patient is concerned as her mother died with a stroke after an MI at age 77.   AMS has completely resolved.  Patient is alert and orientated x 4.    Diabetes mellitus Patient is on Lantus and NovoLog at home. She reports she no longer senses her low CBGs. She had a CBG of 27 on Thanksgiving. She is afraid to take 30 units of Lantus daily at bedtime or fear of hypoglycemia. I have requested a diabetes coordinator consult for education and recommendations.  Hypertension Patient's blood pressures have been soft. Lisinopril has been held.  If blood pressures remain  soft may consider reducing lisinopril dose on discharge.  Migraine Patient reporting a mild posterior headache. Requested Tylenol. Vicodin also on order when necessary  DVT Prophylaxis:  Lovenox  Code Status: Full code Family Communication: Nathaniel Man at bedside Disposition Plan: To home when able   Consultants:  None  Procedures:  2-D echo and carotid Doppler pending  Antibiotics: Anti-infectives    None        HPI/Subjective: Patient complains of a mild headache.  Objective: Filed Vitals:   08/17/14 0503 08/17/14 0800 08/17/14 0956 08/17/14 1200  BP: 97/65 105/66 102/65 108/66  Pulse: 76 76 78 77  Temp:   98 F (36.7 C) 97.6 F (36.4 C)  TempSrc:   Oral Oral  Resp:  12 12 16   Height: 5\' 5"  (1.651 m)     Weight: 73.12 kg (161 lb 3.2 oz)     SpO2: 94% 95% 96% 98%   No intake or output data in the 24 hours ending 08/17/14 1404 Filed Weights   08/16/14 2231 08/17/14 0503  Weight: 65.772 kg (145 lb) 73.12 kg (161 lb 3.2 oz)    Exam: General: Well developed, well nourished, NAD, appears stated age  32:  PERR, EOMI, Anicteic Sclera, MMM. No pharyngeal erythema or exudates  Neck: Supple, no JVD, no masses  Cardiovascular: RRR, S1 S2 auscultated, no rubs, murmurs or gallops.   Respiratory: Clear to auscultation bilaterally with equal chest rise  Abdomen: Soft, nontender, nondistended, + bowel sounds  Extremities: warm dry without cyanosis  clubbing or edema.  Neuro: AAOx3, patient reports mildly decreased sensation on the right side of her face. Tongue appears to shift slightly right. No other focal abnormalities Skin: Without rashes exudates or nodules.   Psych: Normal affect and demeanor with intact judgement and insight       Data Reviewed: Basic Metabolic Panel:  Recent Labs Lab 08/16/14 2347 08/17/14 0552  NA 137  --   K 3.7  --   CL 104  --   CO2 28  --   GLUCOSE 71  --   BUN 7  --   CREATININE 0.51 0.80  CALCIUM 8.9  --    Liver  Function Tests:  Recent Labs Lab 08/16/14 2347  AST 27  ALT 37*  ALKPHOS 100  BILITOT 0.3  PROT 6.2  ALBUMIN 3.8   CBC:  Recent Labs Lab 08/16/14 2347 08/17/14 0552  WBC 8.0 6.6  NEUTROABS 2.8  --   HGB 14.2 13.2  HCT 41.4 38.6  MCV 90.4 91.0  PLT 269 232   CBG:  Recent Labs Lab 08/17/14 0421 08/17/14 0658 08/17/14 1130  GLUCAP 132* 278* 222*      Studies: Ct Head Wo Contrast  08/17/2014   CLINICAL DATA:  Acute onset of dizziness and confusion. Initial encounter.  EXAM: CT HEAD WITHOUT CONTRAST  TECHNIQUE: Contiguous axial images were obtained from the base of the skull through the vertex without intravenous contrast.  COMPARISON:  CT of the head performed 08/03/2014  FINDINGS: There is no evidence of acute infarction, or intra- or extra-axial hemorrhage on CT.  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  The posterior fossa, including the cerebellum, brainstem and fourth ventricle, is within normal limits. The third and lateral ventricles, and basal ganglia are unremarkable in appearance. The cerebral hemispheres are symmetric in appearance, with normal gray-white differentiation. No mass effect or midline shift is seen.  There is no evidence of fracture; visualized osseous structures are unremarkable in appearance. The visualized portions of the orbits are within normal limits. The paranasal sinuses and mastoid air cells are well-aerated. No significant soft tissue abnormalities are seen.  IMPRESSION: 1. No acute intracranial pathology seen on CT. 2. 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   Electronically Signed   By: Garald Balding M.D.   On: 08/17/2014 00:41   Mri Brain Without Contrast  08/17/2014   CLINICAL DATA:  44 year old female with altered mental status with confusion and abnormal speech. Right side headache and dizziness. Initial encounter.  EXAM: MRI HEAD WITHOUT CONTRAST  MRA HEAD WITHOUT  CONTRAST  TECHNIQUE: Multiplanar, multiecho pulse sequences of the brain and surrounding structures were obtained without intravenous contrast. Angiographic images of the head were obtained using MRA technique without contrast.  COMPARISON:  Head CTs without contrast 0012 hr the same day and earlier. Cervical spine MRI 07/24/2014.  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 No significant regional mass effect, and thin fairly uniform appearing cyst wall.  Cerebral volume is normal. No restricted diffusion to suggest acute infarction. No midline shift, mass effect, evidence of mass lesion, ventriculomegaly, extra-axial collection or acute intracranial hemorrhage. Cervicomedullary junction and pituitary are within normal limits. Major intracranial vascular flow voids are preserved. Pear[place] Cables and white matter signal is within normal limits throughout the brain.  Visible internal auditory structures appear normal. Trace right mastoid fluid. Negative nasopharynx. Other Visualized paranasal sinuses and mastoids are clear. Visualized  orbit soft tissues are within normal limits. Visualized scalp soft tissues are within normal limits. Normal bone marrow signal. Negative visualized cervical spine; right C1-C2 joint fluid likely is degenerative in nature.  MRA HEAD FINDINGS  Antegrade flow in the posterior circulation with fairly codominant distal vertebral arteries. Normal left PICA origin. Normal vertebrobasilar junction. Dominant right AICA. Normal basilar artery. SCA and PCA origins are normal. Posterior communicating arteries are normal. Bilateral PCA branches are normal.  Antegrade flow in both ICA siphons. No siphon stenosis. Ophthalmic and posterior communicating artery origins are within normal limits. Normal carotid termini, MCA and ACA origins.  Fenestrated bilateral ACA A1 segments, normal anatomic variation. Anterior communicating artery and visualized bilateral ACA branches are within  normal limits. Visualized bilateral MCA branches are normal.  IMPRESSION: 1. 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 Simple pineal cysts such as this are common and in general produce no symptoms. When large they have sometimes been implicated in Parinaud's Syndrome. 2. Normal intracranial MRA; incidental bilateral ACA A1 segment normal anatomic variation.   Electronically Signed   By: Lars Pinks M.D.   On: 08/17/2014 09:23   Mr Jodene Nam Head/brain Wo Cm  08/17/2014   CLINICAL DATA:  44 year old female with altered mental status with confusion and abnormal speech. Right side headache and dizziness. Initial encounter.  EXAM: MRI HEAD WITHOUT CONTRAST  MRA HEAD WITHOUT CONTRAST  TECHNIQUE: Multiplanar, multiecho pulse sequences of the brain and surrounding structures were obtained without intravenous contrast. Angiographic images of the head were obtained using MRA technique without contrast.  COMPARISON:  Head CTs without contrast 0012 hr the same day and earlier. Cervical spine MRI 07/24/2014.  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 No significant regional mass effect, and thin fairly uniform appearing cyst wall.  Cerebral volume is normal. No restricted diffusion to suggest acute infarction. No midline shift, mass effect, evidence of mass lesion, ventriculomegaly, extra-axial collection or acute intracranial hemorrhage. Cervicomedullary junction and pituitary are within normal limits. Major intracranial vascular flow voids are preserved. Pear[place] Cables and white matter signal is within normal limits throughout the brain.  Visible internal auditory structures appear normal. Trace right mastoid fluid. Negative nasopharynx. Other Visualized paranasal sinuses and mastoids are clear. Visualized orbit soft tissues are within normal limits. Visualized scalp soft tissues are within normal limits. Normal bone  marrow signal. Negative visualized cervical spine; right C1-C2 joint fluid likely is degenerative in nature.  MRA HEAD FINDINGS  Antegrade flow in the posterior circulation with fairly codominant distal vertebral arteries. Normal left PICA origin. Normal vertebrobasilar junction. Dominant right AICA. Normal basilar artery. SCA and PCA origins are normal. Posterior communicating arteries are normal. Bilateral PCA branches are normal.  Antegrade flow in both ICA siphons. No siphon stenosis. Ophthalmic and posterior communicating artery origins are within normal limits. Normal carotid termini, MCA and ACA origins.  Fenestrated bilateral ACA A1 segments, normal anatomic variation. Anterior communicating artery and visualized bilateral ACA branches are within normal limits. Visualized bilateral MCA branches are normal.  IMPRESSION: 1. 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 Simple pineal cysts such as this are common and in general produce no symptoms. When large they have sometimes been implicated in Parinaud's Syndrome. 2. Normal intracranial MRA; incidental bilateral ACA A1 segment normal anatomic variation.   Electronically Signed  By: Lars Pinks M.D.   On: 08/17/2014 09:23    Scheduled Meds: . aspirin  325 mg Oral Daily  . enoxaparin (LOVENOX) injection  40 mg Subcutaneous Q24H  . feeding supplement (ENSURE COMPLETE)  237 mL Oral BID BM  . gabapentin  400 mg Oral QID  . insulin aspart  0-15 Units Subcutaneous TID WC  . [START ON 08/18/2014] insulin glargine  30 Units Subcutaneous QHS  . simvastatin  20 mg Oral QHS  . zolpidem  5 mg Oral QHS   Continuous Infusions: . sodium chloride 100 mL/hr at 08/17/14 0543    Principal Problem:   TIA (transient ischemic attack) Active Problems:   DM (diabetes mellitus)  TIA versus stroke We'll admit the patient to telemetry, initiate stroke workup including MRI/MRA  brain, carotid duplex, fasting lipid profile, him an A1c. CT head was done the ED which was negative. Patient will be transferred to [facility] as MRI is not available over the weekends. Will give aspirin 325 mg by mouth daily.  Diabetes mellitus Continue Lantus 30 units at bedtime, and initiate sliding scale insulin with NovoLog.  History of hypertension Hold lisinopril and blood pressure is soft.  Migraine Continue when necessary Vicodin.  DVT prophylaxis  Lovenox  Code status: Full code  Family discussion: No family at bedside  Karen Kitchens  [facility] Pager [phone]. If 7PM-7AM, please contact night-coverage at [email or web address], password [facility] 08/17/2014, 2:04 PM  LOS: 1 day

## 2014-08-17 NOTE — ED Notes (Signed)
Pt. C/o headache. EDP notified.

## 2014-08-18 DIAGNOSIS — R4182 Altered mental status, unspecified: Secondary | ICD-10-CM | POA: Diagnosis not present

## 2014-08-18 DIAGNOSIS — G459 Transient cerebral ischemic attack, unspecified: Secondary | ICD-10-CM

## 2014-08-18 DIAGNOSIS — E109 Type 1 diabetes mellitus without complications: Secondary | ICD-10-CM

## 2014-08-18 LAB — GLUCOSE, CAPILLARY
Glucose-Capillary: 494 mg/dL — ABNORMAL HIGH (ref 70–99)
Glucose-Capillary: 441 mg/dL — ABNORMAL HIGH (ref 70–99)
Glucose-Capillary: 271 mg/dL — ABNORMAL HIGH (ref 70–99)
Glucose-Capillary: 179 mg/dL — ABNORMAL HIGH (ref 70–99)
Glucose-Capillary: 359 mg/dL — ABNORMAL HIGH (ref 70–99)

## 2014-08-18 MED ORDER — INSULIN ASPART 100 UNIT/ML ~~LOC~~ SOLN
18.0000 [IU] | Freq: Once | SUBCUTANEOUS | Status: AC
  Administered 2014-08-18: 18 [IU] via SUBCUTANEOUS

## 2014-08-18 MED ORDER — INSULIN ASPART 100 UNIT/ML ~~LOC~~ SOLN: 3.0000 [IU] | Freq: Three times a day (TID) | SUBCUTANEOUS | Status: DC

## 2014-08-18 MED ORDER — ENSURE COMPLETE PO LIQD: 237.0000 mL | Freq: Two times a day (BID) | ORAL | Status: DC

## 2014-08-18 MED ORDER — INSULIN ASPART 100 UNIT/ML ~~LOC~~ SOLN
0.0000 [IU] | Freq: Three times a day (TID) | SUBCUTANEOUS | Status: DC
Start: 2014-08-18 — End: 2014-08-18

## 2014-08-18 MED ORDER — ASPIRIN EC 81 MG PO TBEC: 81.0000 mg | DELAYED_RELEASE_TABLET | Freq: Every day | ORAL | Status: DC

## 2014-08-18 MED ORDER — ONDANSETRON HCL 4 MG/2ML IJ SOLN
4.0000 mg | Freq: Four times a day (QID) | INTRAMUSCULAR | Status: DC | PRN
  Administered 2014-08-18: 4 mg via INTRAVENOUS
  Filled 2014-08-18: qty 2

## 2014-08-18 MED ORDER — INSULIN GLARGINE 100 UNIT/ML ~~LOC~~ SOLN
30.0000 [IU] | Freq: Every day | SUBCUTANEOUS | Status: DC
  Administered 2014-08-18: 30 [IU] via SUBCUTANEOUS
  Filled 2014-08-18: qty 0.3

## 2014-08-18 MED ORDER — INSULIN ASPART 100 UNIT/ML ~~LOC~~ SOLN: 0.0000 [IU] | Freq: Three times a day (TID) | SUBCUTANEOUS | Status: DC

## 2014-08-18 NOTE — Discharge Summary (Signed)
Physician Discharge Summary  [name] GMW:[id number] DOB: 1971-02-05 DOA: 08/16/2014  PCP: Neale Burly, MD  Admit date: 08/16/2014 Discharge date: 08/18/2014  Time spent: 35 minutes  Recommendations for Outpatient Follow-up:  1. Outpatient Vestibular Physical Therapy 2. Uncontrolled DM HGB A1C 11.5 3. Monitor BP.  Due to low BP lisinopril held during hospitalization.  Please determine if it needs to be restarted. 4. Please consider weaning some medications (norco, ativan)   Discharge Diagnoses:  Principal Problem:   TIA (transient ischemic attack) Active Problems:   DM (diabetes mellitus)   Tobacco abuse   Discharge Condition: stable.  Diet recommendation: carb mod. Heart healthy  History of present illness:  44 rolled female who has a past medical history of Diabetes mellitus, Neuropathy; Depression; GERD Migraine headache; IBS; Vertigo; Hypertension; and Hyperlipidemia. Presented to [facility] ER for altered mental status which began around 8 PM last night. Patient says that she laid down to take a nap at 5 PM and woke up at 8 PM confused at that time she did no the time of the day and she thought it was morning. Patient took her morning medications and then called her dad who felt that patient was " talking gibberish ". Patient also complained of right-sided headache along with dizziness. She also had nausea but no vomiting no diarrhea. No syncope no seizure activity no facial droop no visual changes no focal motor weakness. Patient denies chest pain no shortness of breath. She has a history of diabetes mellitus and takes Lantus at home.   Hospital Course:   Altered mental status Differential includes TIA, Medications, Hyper/Hypoglycemia, atypical migraine, hypotension. She takes Xanax, Gabapentin, and Norco at home all of which could have contributed to her symptoms. Further she reports having a range of CBGs from 27 to 600+. These could have contributed as  well. CT Head and MR Brain are negative.2D echo and carotid dopplers are within normal limits. Patient is concerned as her mother died with a stroke after an MI at age 44. AMS has completely resolved. Patient is alert and orientated x 4.  Diabetes mellitus Patient is on Lantus and NovoLog at home. She reports she no longer senses her low CBGs. She had a CBG of 27 on Thanksgiving. She is afraid to take 30 units of Lantus daily at bedtime or fear of hypoglycemia. She received education from the Diabetic Coordinator.  HGB A1C 11.5  Hypertension Patient's blood pressures have been soft. Lisinopril has been held.Will request that the patient continue to hold Lisinopril until she follows up with her primary care physician for her hospital follow up visit.  Migraine Patient reporting a mild posterior headache. Requested Tylenol. Vicodin also on order when necessary   Procedures:  2-D echo - Normal  Carotid Dopplers - Preliminary report: Bilateral: 1-39% ICA stenosis. Vertebral artery flow is antegrade.   Consultations:  Neurology  Discharge Exam: Filed Vitals:   08/18/14 0152 08/18/14 0555 08/18/14 0943 08/18/14 1309  BP: 101/50 121/59 97/54 98/53   Pulse: 79 102 93 91  Temp: 97.9 F (36.6 C) 97.8 F (36.6 C) 97.9 F (36.6 C) 97.9 F (36.6 C)  TempSrc: Oral Oral Oral Oral  Resp: 18 20 18 20   Height:      Weight:      SpO2: 97% 95% 95% 98%   Filed Weights   08/16/14 2231 08/17/14 0503  Weight: 65.772 kg (145 lb) 73.12 kg (161 lb 3.2 oz)     General: Well-developed well-nourished Caucasian female.  Well appearing. Lying comfortably in bed Cardiovascular: Regular rate and rhythm, no murmurs rubs or gallops.  Respiratory: Clear to auscultation bilaterally, no wheezes crackles or rales Abdominal: Soft, nontender, nondistended, positive bowel sounds  Extremities: 5 over 5 strength in each, able to ambulate with ease, no edema  Discharge Instructions  Discharge  Instructions    Diet - low sodium heart healthy    Complete by:  As directed      Increase activity slowly    Complete by:  As directed           Current Discharge Medication List    START taking these medications   Details  aspirin EC 81 MG tablet Take 1 tablet (81 mg total) by mouth daily.      CONTINUE these medications which have NOT CHANGED   Details  docusate sodium (COLACE) 100 MG capsule Take 100 mg by mouth 2 (two) times daily.    escitalopram (LEXAPRO) 20 MG tablet Take 20 mg by mouth daily.    estradiol (ESTRACE) 0.5 MG tablet Take 0.5 mg by mouth every morning.    FIBER PO Take 1 capsule by mouth daily.    gabapentin (NEURONTIN) 400 MG capsule Take 400 mg by mouth 4 (four) times daily.    HYDROcodone-acetaminophen (NORCO) 10-325 MG per tablet Take 1 tablet by mouth every 6 (six) hours as needed for moderate pain.    insulin glargine (LANTUS SOLOSTAR) 100 UNIT/ML injection Inject 30 Units into the skin at bedtime.    insulin lispro (HUMALOG) 100 UNIT/ML injection Inject 12 Units into the skin 3 (three) times daily before meals. Also sliding scale    LORazepam (ATIVAN) 1 MG tablet Take 1 mg by mouth every 8 (eight) hours as needed for anxiety.    Multiple Vitamin (MULTIVITAMIN WITH MINERALS) TABS Take 1 tablet by mouth every morning.    simvastatin (ZOCOR) 40 MG tablet Take 40 mg by mouth daily.      STOP taking these medications     lisinopril (PRINIVIL,ZESTRIL) 5 MG tablet      ALPRAZolam (XANAX) 0.5 MG tablet      HYDROcodone-acetaminophen (NORCO) 7.5-325 MG per tablet        Allergies  Allergen Reactions  . Clindamycin/Lincomycin Rash  . Codeine Rash  . Penicillins Rash  . Sulfa Antibiotics Rash        Follow-up Information    Follow up with HASANAJ,XAJE A, MD In 1 week.   Specialty:  Internal Medicine   Contact information:   [place] [phone]       Follow up with NIDA,GEBRESELASSIE, MD In 1 week.    Specialty:  Endocrinology   Why:  diabetes management   Contact information:   [place] [phone]        The results of significant diagnostics from this hospitalization (including imaging, microbiology, ancillary and laboratory) are listed below for reference.    Significant Diagnostic Studies: Ct Head Wo Contrast  08/17/2014   CLINICAL DATA:  Acute onset of dizziness and confusion. Initial encounter.  EXAM: CT HEAD WITHOUT CONTRAST  TECHNIQUE: Contiguous axial images were obtained from the base of the skull through the vertex without intravenous contrast.  COMPARISON:  CT of the head performed 08/03/2014  FINDINGS: There is no evidence of acute infarction, or intra- or extra-axial hemorrhage on CT.  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  The posterior  fossa, including the cerebellum, brainstem and fourth ventricle, is within normal limits. The third and lateral ventricles, and basal ganglia are unremarkable in appearance. The cerebral hemispheres are symmetric in appearance, with normal gray-white differentiation. No mass effect or midline shift is seen.  There is no evidence of fracture; visualized osseous structures are unremarkable in appearance. The visualized portions of the orbits are within normal limits. The paranasal sinuses and mastoid air cells are well-aerated. No significant soft tissue abnormalities are seen.  IMPRESSION: 1. No acute intracranial pathology seen on CT. 2. 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   Electronically Signed   By: Garald Balding M.D.   On: 08/17/2014 00:41   Mri Brain Without Contrast  08/17/2014   CLINICAL DATA:  44 year old female with altered mental status with confusion and abnormal speech. Right side headache and dizziness. Initial encounter.  EXAM: MRI HEAD WITHOUT CONTRAST  MRA HEAD WITHOUT CONTRAST  TECHNIQUE: Multiplanar, multiecho pulse  sequences of the brain and surrounding structures were obtained without intravenous contrast. Angiographic images of the head were obtained using MRA technique without contrast.  COMPARISON:  Head CTs without contrast 0012 hr the same day and earlier. Cervical spine MRI 07/24/2014.  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 No significant regional mass effect, and thin fairly uniform appearing cyst wall.  Cerebral volume is normal. No restricted diffusion to suggest acute infarction. No midline shift, mass effect, evidence of mass lesion, ventriculomegaly, extra-axial collection or acute intracranial hemorrhage. Cervicomedullary junction and pituitary are within normal limits. Major intracranial vascular flow voids are preserved. Pearline Cables and white matter signal is within normal limits throughout the brain.  Visible internal auditory structures appear normal. Trace right mastoid fluid. Negative nasopharynx. Other Visualized paranasal sinuses and mastoids are clear. Visualized orbit soft tissues are within normal limits. Visualized scalp soft tissues are within normal limits. Normal bone marrow signal. Negative visualized cervical spine; right C1-C2 joint fluid likely is degenerative in nature.  MRA HEAD FINDINGS  Antegrade flow in the posterior circulation with fairly codominant distal vertebral arteries. Normal left PICA origin. Normal vertebrobasilar junction. Dominant right AICA. Normal basilar artery. SCA and PCA origins are normal. Posterior communicating arteries are normal. Bilateral PCA branches are normal.  Antegrade flow in both ICA siphons. No siphon stenosis. Ophthalmic and posterior communicating artery origins are within normal limits. Normal carotid termini, MCA and ACA origins.  Fenestrated bilateral ACA A1 segments, normal anatomic variation. Anterior communicating artery and visualized bilateral ACA branches are within normal limits. Visualized bilateral MCA branches are  normal.  IMPRESSION: 1. 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 Simple pineal cysts such as this are common and in general produce no symptoms. When large they have sometimes been implicated in Parinaud's Syndrome. 2. Normal intracranial MRA; incidental bilateral ACA A1 segment normal anatomic variation.   Electronically Signed   By: Lars Pinks M.D.   On: 08/17/2014 09:23   Mr Jodene Nam Head/brain Wo Cm  08/17/2014   CLINICAL DATA:  44 year old female with altered mental status with confusion and abnormal speech. Right side headache and dizziness. Initial encounter.  EXAM: MRI HEAD WITHOUT CONTRAST  MRA HEAD WITHOUT CONTRAST  TECHNIQUE: Multiplanar, multiecho pulse sequences of the brain and surrounding structures were obtained without intravenous contrast. Angiographic images of the head were obtained using MRA technique without contrast.  COMPARISON:  Head CTs without contrast  0012 hr the same day and earlier. Cervical spine MRI 07/24/2014.  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 No significant regional mass effect, and thin fairly uniform appearing cyst wall.  Cerebral volume is normal. No restricted diffusion to suggest acute infarction. No midline shift, mass effect, evidence of mass lesion, ventriculomegaly, extra-axial collection or acute intracranial hemorrhage. Cervicomedullary junction and pituitary are within normal limits. Major intracranial vascular flow voids are preserved. Pearline Cables and white matter signal is within normal limits throughout the brain.  Visible internal auditory structures appear normal. Trace right mastoid fluid. Negative nasopharynx. Other Visualized paranasal sinuses and mastoids are clear. Visualized orbit soft tissues are within normal limits. Visualized scalp soft tissues are within normal limits. Normal bone marrow signal. Negative visualized cervical spine;  right C1-C2 joint fluid likely is degenerative in nature.  MRA HEAD FINDINGS  Antegrade flow in the posterior circulation with fairly codominant distal vertebral arteries. Normal left PICA origin. Normal vertebrobasilar junction. Dominant right AICA. Normal basilar artery. SCA and PCA origins are normal. Posterior communicating arteries are normal. Bilateral PCA branches are normal.  Antegrade flow in both ICA siphons. No siphon stenosis. Ophthalmic and posterior communicating artery origins are within normal limits. Normal carotid termini, MCA and ACA origins.  Fenestrated bilateral ACA A1 segments, normal anatomic variation. Anterior communicating artery and visualized bilateral ACA branches are within normal limits. Visualized bilateral MCA branches are normal.  IMPRESSION: 1. 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 Simple pineal cysts such as this are common and in general produce no symptoms. When large they have sometimes been implicated in Parinaud's Syndrome. 2. Normal intracranial MRA; incidental bilateral ACA A1 segment normal anatomic variation.   Electronically Signed   By: Lars Pinks M.D.   On: 08/17/2014 09:23   2D Echo 08/18/2014 Study Conclusions  - Left ventricle: The cavity size was normal. Systolic function was normal. The estimated ejection fraction was in the range of 55% to 60%. Wall motion was normal; there were no regional wall motion abnormalities. - Aortic valve: Valve area (Vmax): 1.98 cm^2.  Impressions:  - No cardiac source of emboli was indentified.    Labs: Basic Metabolic Panel:  Recent Labs Lab 08/16/14 2347 08/17/14 0552  NA 137  --   K 3.7  --   CL 104  --   CO2 28  --   GLUCOSE 71  --   BUN 7  --   CREATININE 0.51 0.80  CALCIUM 8.9  --    Liver Function Tests:  Recent Labs Lab 08/16/14 2347  AST 27  ALT 37*  ALKPHOS 100  BILITOT 0.3  PROT 6.2   ALBUMIN 3.8   CBC:  Recent Labs Lab 08/16/14 2347 08/17/14 0552  WBC 8.0 6.6  NEUTROABS 2.8  --   HGB 14.2 13.2  HCT 41.4 38.6  MCV 90.4 91.0  PLT 269 232   CBG:  Recent Labs Lab 08/17/14 2142 08/18/14 0642 08/18/14 0733 08/18/14 0904 08/18/14 1129  GLUCAP 148* 494* 441* 271* 179*       Signed:  Melton Alar, PA-C  [facility] 08/18/2014, 4:04 PM

## 2014-08-18 NOTE — Discharge Instructions (Signed)
Your work up does not show Stroke or TIA.  You have multiple possible reasons for the episode of altered mental status including:  Medications (Norco / Ativan / Gabapentin can cause it), Low blood pressure, Atypical Migraine, abnormal sugar level.  Please work with your primary care physician to continue to diagnose this problem particularly if it happens again.  Your Lisinopril has been temporarily discontinued as your blood pressure in the hospital has been "low normal".  Please see your primary care doctor in the next week regarding your blood pressure, and blood pressure medications.  You are a Brittle diabetic and need to be under the close supervision of an endocrinologist.  In the hospital you definitely needed the 30 units of lantus insulin.  Please continue to take it.  By checking your CBGs you may determine that you need less than 12 units of humalog three times a day.  Please see Dr. Dorris Fetch in the next week.  The 2D echo of your heart looked very good. Please ask your primary care physician to order outpatient Carotid Doppler Ultrasound (this can be done at [facility])  [name]

## 2014-08-18 NOTE — Progress Notes (Addendum)
Inpatient Diabetes Program Recommendations  AACE/ADA: New Consensus Statement on Inpatient Glycemic Control (2013)  Target Ranges:  Prepandial:   less than 140 mg/dL      Peak postprandial:   less than 180 mg/dL (1-2 hours)      Critically ill patients:  140 - 180 mg/dL   Results for [name], [name] (MRN [id number]) as of 08/18/2014 08:44  Ref. Range 08/17/2014 04:21 08/17/2014 06:58 08/17/2014 11:30 08/17/2014 16:13 08/17/2014 21:42 08/18/2014 06:42 08/18/2014 07:33  Glucose-Capillary Latest Range: 70-99 mg/dL 132 (H) 278 (H) 222 (H) 283 (H) 148 (H) 494 (H) 441 (H)    Diabetes history: DM1 Outpatient Diabetes medications: Lantus 30 units QHS, Humalog 12 units TID with meals if CBG <150 mg/dl and if >150 mg/dl then she adds additional units based on glucose Current orders for Inpatient glycemic control: Lantus 30 units QHS, Novolog 0-15 units TID with meals  Inpatient Diabetes Program Recommendations Insulin - Basal: Patient has Type 1 diabetes and requires basal insulin. Noted patient has not received any basal insulin since being admitted. As a result bedtime CBG was148 mg/dl and now it is up to 494 mg/dl this morning. Would have recommended to start Lantus now but concerned about potential hypoglycemia since patient received Novolog 18 units for correction this am. If patient does not experience any hypoglycemia this morning, please consider changing Lantus 30 units to Q24H (to start at 2pm). Correction (SSI): Patient is sensitive to insulin as a Type 1. Please decrease correction to sensitive correction scale. Insulin - Meal Coverage: Patient will likely need meal coverage if she is eating. Please consider ordering Novolog 0-10 units TID with meals (1 unit for every 15 grams of carbs) in addition to sensitive correction scale. HgbA1C: A1C 11.5% on 08/17/14.  Note:08/18/14 @ 12:30-Spoke with patient about diabetes and home regimen for diabetes control. Patient has Type 1 diabetes and reports  that she takes Lantus 30 unit QHS and Humalog 12 units TID with meals if CBG is Less than 150 mg/dl and if over 150 mg/dl then she uses a sliding scale for addition insulin taken at that time as an outpatient for diabetes control.  Patient reports that her glucose fluctuates from 30-400's mg/dl and that she has difficulty with keeping diabetes controlled.  Inquired about knowledge about A1C and patient reports that she knows what an A1C is and reports that her last A1C was 11%. Discussed A1C results (11.5% on 08/17/14) and explained importance of improving A1C and checking CBGs to maintain good CBG control to prevent long-term and short-term complications. Stressed increased risk of stroke with elevated A1C and glucose levels.  Patient expressed concern about glucose of 494 mg/dl this morning and reports that she has a headache and that she was nauseated and vomited with morning due to her elevated glucose. Explained that she was not given any basal insulin on 1/16 which is why her glucose was so elevated this morning. Patient unsure why Lantus was going to be given at 2pm today. Explained concern for hypoglycemia since she received Novolog 18 units this morning for CBG of 494 mg/dl and that we needed to wait until around 2 pm to give the Novolog time to stop working. Explained that we did not want to wait until bedtime to give Lantus because her glucose would again become very elevated with no basal insulin on board. Patient reports that she now understands and is agreeable to Lantus being given at 2pm today. Patient verbalized understanding of information discussed and  she states that she has no further questions at this time related to diabetes. Also talked with Santiago Glad, RN to inform her of conversation with patient.  Thanks, Barnie Alderman, RN, MSN, CCRN, CDE Diabetes Coordinator Inpatient Diabetes Program [phone] (Team Pager) [phone] ([facility] office) [phone] [facility] office)

## 2014-08-18 NOTE — Progress Notes (Signed)
Echocardiogram 2D Echocardiogram has been performed.  [name] 08/18/2014, 11:48 AM

## 2014-08-18 NOTE — Progress Notes (Signed)
VASCULAR LAB PRELIMINARY  PRELIMINARY  PRELIMINARY  PRELIMINARY  Carotid duplex  completed.    Preliminary report:  Bilateral:  1-39% ICA stenosis.  Vertebral artery flow is antegrade.      Shail Urbas, RVT 08/18/2014, 3:41 PM

## 2014-08-18 NOTE — Progress Notes (Signed)
DC instructions discussed with patient. All questions answered and patient verbalized understanding and compliance. Patient in [facility] escorted to lobby by staff. Patient's father to transport home per family vehicle.

## 2014-08-20 LAB — URINE CULTURE: Colony Count: 25000

## 2014-09-03 ENCOUNTER — Ambulatory Visit: Payer: Medicare Other | Admitting: Internal Medicine

## 2014-09-11 ENCOUNTER — Encounter: Payer: Self-pay | Admitting: Internal Medicine

## 2014-09-11 ENCOUNTER — Ambulatory Visit: Payer: Medicare Other | Admitting: Internal Medicine

## 2014-09-20 ENCOUNTER — Encounter: Payer: Self-pay | Admitting: Neurology

## 2014-09-20 ENCOUNTER — Ambulatory Visit (INDEPENDENT_AMBULATORY_CARE_PROVIDER_SITE_OTHER): Payer: Medicare Other | Admitting: Neurology

## 2014-09-20 VITALS — BP 128/60 | HR 86 | Temp 98.0°F | Resp 20 | Ht 67.0 in | Wt 155.0 lb

## 2014-09-20 DIAGNOSIS — R4189 Other symptoms and signs involving cognitive functions and awareness: Secondary | ICD-10-CM

## 2014-09-20 DIAGNOSIS — R404 Transient alteration of awareness: Secondary | ICD-10-CM | POA: Diagnosis not present

## 2014-09-20 NOTE — Progress Notes (Addendum)
NEUROLOGY CONSULTATION NOTE  [name] MRN: [id number] DOB: 10/13/1970  Referring provider: Dr. Morene Rankins Primary care provider: Dr. Morene Rankins  Reason for consult:  TIA  HISTORY OF PRESENT ILLNESS: [name] is a 44 year old right-handed woman with type 2 diabetes, neuropathy, hypertension, hyperlipidemia, tobacco abuse, depression, GERD, migraine, IBS, and vertigo who presents for TIA.  Records, labs, CT head and MRI and MRA of head reviewed.  On 08/16/14, she woke up that night confused.  She thought it was the following morning.  She just didn't feel right.  She called her father who said that she was making nonsensical speech.  She had mild bilateral headache.  There was no associated nausea.  She went to the ED.  Her exam was reportedly unremarkable except for subjective sensory loss to light touch on the left, however she reported numbness and tingling involving all extremities.  There was no focal weakness.  Urine drug screen and serum alcohol was negative.  Glucose was 71.  She was admitted to the hospital for stroke workup.  CT, MRI and MRA of the head were negative.  Incidental stable small pineal cyst noted.  2D echo showed LVEF 55-60%.  Carotid doppler revealed no hemodynamically significant stenosis.  LDL was 88.  Hgb A1c was 11.5.  She was started on ASA 81mg  daily.  She was advised to continue simvastatin 40mg .  She says that she doesn't really remember her stay at the hospital very well.  She has been managing her glucose levels which fluctuate.  She has not had any other spells, but she reports cognitive changes.  Her short term memory is poor.  She needs to wright everything down so she doesn't forget.  She always used to pay the bills, but is now unable to do it so her father helps her.  She does report being under a lot of stress, particularly regarding her current divorce.  She completed one year of college.  She is on disability due to her  uncontrolled diabetes and fluctuation in sugars.  PAST MEDICAL HISTORY: Past Medical History  Diagnosis Date  . Diabetes mellitus without complication   . Neuropathy   . Depression   . GERD (gastroesophageal reflux disease)   . Hiatal hernia   . Migraine headache   . IBS (irritable bowel syndrome)   . Vertigo   . Hypertension   . Hyperlipidemia   . Anxiety   . Abdominal wall mass of right flank 04/11/13  . Abrasion of leg with infection 02/18/14  . Benign paroxysmal vertigo 09/04/13  . Bronchitis, acute 02/28/14  . Bunion of great toe 04/11/13  . Cervicalgia 06/13/14  . Dorsalgia 05/17/14  . Dysuria 03/27/14  . Gastroenteritis 01/28/14  . Gastro-esophageal reflux disease without esophagitis 01/28/14  . Pain in thoracic spine 06/13/14  . 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 Peripheral neuropathy 11/05/13  . Subungual hematoma of digit of hand   . Tendonitis 03/07/13  . Tobacco use     PAST SURGICAL HISTORY: Past Surgical History  Procedure Laterality Date  . Cesarean section    . Tubal ligation    . Abdominal hysterectomy    . Appendectomy    . Cholecystectomy    . Cyst excision      right breast  . Cardiac catheterization  2006    normal coronary arteries    MEDICATIONS: Current Outpatient Prescriptions on File Prior to Visit  Medication Sig Dispense Refill  . Albuterol Sulfate  108 (90 BASE) MCG/ACT AEPB Inhale 1 puff into the lungs every 4 (four) hours as needed.    . Alcohol Swabs (ALCOHOL PREP PAD) 70 % PADS   2  . aspirin EC 81 MG tablet Take 1 tablet (81 mg total) by mouth daily.    Marland Kitchen docusate sodium (COLACE) 100 MG capsule Take 100 mg by mouth 2 (two) times daily.    Marland Kitchen escitalopram (LEXAPRO) 20 MG tablet Take 20 mg by mouth daily.    Marland Kitchen estradiol (ESTRACE) 0.5 MG tablet Take 0.5 mg by mouth every morning.    Marland Kitchen FIBER PO Take 1 capsule by mouth daily.    Marland Kitchen gabapentin (NEURONTIN) 400 MG capsule Take 400 mg by mouth 4 (four) times daily.    . insulin glargine  (LANTUS SOLOSTAR) 100 UNIT/ML injection Inject 30 Units into the skin at bedtime.    . insulin lispro (HUMALOG) 100 UNIT/ML injection Inject 12 Units into the skin 3 (three) times daily before meals. Also sliding scale    . lisinopril (PRINIVIL,ZESTRIL) 5 MG tablet Take 5 mg by mouth daily.    . Omega-3 Fatty Acids (FISH OIL) 1000 MG CAPS Take 1 capsule by mouth daily.    . simvastatin (ZOCOR) 40 MG tablet Take 40 mg by mouth daily.    Marland Kitchen HYDROcodone-acetaminophen (NORCO) 10-325 MG per tablet Take 1 tablet by mouth every 6 (six) hours as needed for moderate pain.    Marland Kitchen Hydrocodone-Acetaminophen 5-300 MG TABS   0  . LORazepam (ATIVAN) 1 MG tablet Take 1 mg by mouth every 8 (eight) hours as needed for anxiety.    . meclizine (ANTIVERT) 25 MG tablet Take 25 mg by mouth every 6 (six) hours as needed.   0  . Multiple Vitamin (MULTIVITAMIN WITH MINERALS) TABS Take 1 tablet by mouth every morning.     No current facility-administered medications on file prior to visit.    ALLERGIES: Allergies  Allergen Reactions  . Clindamycin/Lincomycin Rash  . Codeine Rash  . Penicillins Rash  . Sulfa Antibiotics Rash         FAMILY HISTORY: Family History  Problem Relation Age of Onset  . Stroke Mother 21  . Heart attack Mother 64  . Cancer Sister     colon  . Heart failure Maternal Grandmother 65  . Cancer Maternal Grandfather     prostate  . Heart failure Paternal Grandmother 66  . Heart failure Paternal Grandfather 34    SOCIAL HISTORY: History   Social History  . Marital Status: Divorced    Spouse Name: N/A  . Number of Children: N/A  . Years of Education: N/A   Occupational History  . Not on file.   Social History Main Topics  . Smoking status: Current Every Day Smoker -- 0.50 packs/day for 11 years  . Smokeless tobacco: Never Used     Comment: patient is aware that she needs to quit smoking  . Alcohol Use: No  . Drug Use: No  . Sexual Activity: No   Other Topics Concern  .  Not on file   Social History Narrative    REVIEW OF SYSTEMS: Constitutional: No fevers, chills, or sweats, no generalized fatigue, change in appetite Eyes: No visual changes, double vision, eye pain Ear, nose and throat: No hearing loss, ear pain, nasal congestion, sore throat Cardiovascular: No chest pain, palpitations Respiratory:  No shortness of breath at rest or with exertion, wheezes GastrointestinaI: No nausea, vomiting, diarrhea, abdominal pain, fecal incontinence Genitourinary:  No dysuria, urinary retention or frequency Musculoskeletal:  No neck pain, back pain Integumentary: No rash, pruritus, skin lesions Neurological: as above Psychiatric: Depression, anxiety Endocrine: No palpitations, fatigue, diaphoresis, mood swings, change in appetite, change in weight, increased thirst Hematologic/Lymphatic:  No anemia, purpura, petechiae. Allergic/Immunologic: no itchy/runny eyes, nasal congestion, recent allergic reactions, rashes  PHYSICAL EXAM: Filed Vitals:   09/20/14 1413  BP: 128/60  Pulse: 86  Temp: 98 F (36.7 C)  Resp: 20   General: No acute distress Head:  Normocephalic/atraumatic Eyes:  fundi unremarkable, without vessel changes, exudates, hemorrhages or papilledema. Neck: supple, no paraspinal tenderness, full range of motion Back: No paraspinal tenderness Heart: regular rate and rhythm Lungs: Clear to auscultation bilaterally. Vascular: No carotid bruits. Neurological Exam: Mental status: alert and oriented to person, place, and time, recent and remote memory intact, fund of knowledge intact, attention and concentration intact, speech fluent and not dysarthric, language intact. Cranial nerves: CN I: not tested CN II: pupils equal, round and reactive to light, visual fields intact, fundi unremarkable, without vessel changes, exudates, hemorrhages or papilledema. CN III, IV, VI:  full range of motion, no nystagmus, no ptosis CN V: reduced left V1-V3  sensation CN VII: upper and lower face symmetric CN VIII: hearing intact CN IX, X: gag intact, uvula midline CN XI: sternocleidomastoid and trapezius muscles intact CN XII: tongue midline Bulk & Tone: normal, no fasciculations. Motor:  5/5 throughout Sensation:  Reduced pinprick sensation in both hands, arms, feet and legs.  Vibration intact. Deep Tendon Reflexes:  2+ throughout, toes downgoing. Finger to nose testing:  No dysmetria Heel to shin:  No dysmetria Gait:  Normal station and stride.  Able to turn and walk in tandem. Romberg negative.  IMPRESSION: Transient altered awareness Cognitive changes Tobacco abuse  I am not convinced that she had a TIA.  Her symptoms are vague and are not localizable.  Also, her performance on the [facility] is out of proportion to her presentation.  It is possible that these symptoms are somatization, related to the stress she is currently experiencing.    PLAN: 1.  We will check EEG, given that she has trouble remembering the hospital stay and event. 2.  We will refer her to neuropsychological testing.  This will help [place] appropriately guide her treatment. 3.  In the meantime, she will remain on ASA 81mg . 4.  Follow up after testing 5.  Smoking cessation  Thank you for allowing me to take part in the care of this patient.  Metta Clines, DO  CC:  Denny Levy, MD

## 2014-09-20 NOTE — Patient Instructions (Signed)
I am not completely convinced that you had a TIA.  I would like to test for other things that may be a cause of all of this. 1.  We will get an EEG to see if there is any evidence for increased risk of seizures 2.  We will refer you for neuropsychological testing 3.  Follow up afterwards

## 2014-09-23 ENCOUNTER — Other Ambulatory Visit: Payer: Medicare Other | Admitting: Neurology

## 2014-09-27 ENCOUNTER — Telehealth: Payer: Self-pay | Admitting: Neurology

## 2014-09-27 NOTE — Telephone Encounter (Signed)
Note faxed to Dr Morene Rankins at [phone] with confirmation received.

## 2014-09-30 ENCOUNTER — Telehealth: Payer: Self-pay | Admitting: Neurology

## 2014-09-30 NOTE — Telephone Encounter (Signed)
09/23/14 EEG no showed. A letter was not sent as pt has already r/s appt / Sherri S.

## 2014-10-07 ENCOUNTER — Telehealth: Payer: Self-pay | Admitting: Neurology

## 2014-10-07 NOTE — Telephone Encounter (Signed)
Pt canceled EEG on 10-09-14 and did not resch at this time

## 2014-10-09 ENCOUNTER — Other Ambulatory Visit: Payer: Medicare Other

## 2014-10-28 ENCOUNTER — Ambulatory Visit (INDEPENDENT_AMBULATORY_CARE_PROVIDER_SITE_OTHER): Payer: Medicare Other | Admitting: Internal Medicine

## 2014-10-28 ENCOUNTER — Encounter: Payer: Self-pay | Admitting: Internal Medicine

## 2014-10-28 VITALS — BP 112/60 | HR 88 | Temp 97.7°F | Resp 12 | Ht 67.0 in | Wt 150.6 lb

## 2014-10-28 DIAGNOSIS — E1040 Type 1 diabetes mellitus with diabetic neuropathy, unspecified: Secondary | ICD-10-CM | POA: Diagnosis not present

## 2014-10-28 DIAGNOSIS — E1065 Type 1 diabetes mellitus with hyperglycemia: Secondary | ICD-10-CM

## 2014-10-28 DIAGNOSIS — E1042 Type 1 diabetes mellitus with diabetic polyneuropathy: Secondary | ICD-10-CM

## 2014-10-28 MED ORDER — GLUCAGON (RDNA) 1 MG IJ KIT
1.0000 mg | PACK | Freq: Once | INTRAMUSCULAR | Status: DC | PRN
Start: 2014-10-28 — End: 2014-10-31

## 2014-10-28 NOTE — Progress Notes (Signed)
Patient ID: [name], female   DOB: 23-Mar-1971, 44 y.o.   MRN: [id number]  HPI: [name] is a 44 y.o.-year-old female, referred by her PCP, [name], [place], for management of DM1, dx in 70 (44 y/o), uncontrolled, with complications (cerebro-vascular ds - h/o TIA 0/2016, PN).  Last hemoglobin A1c was: Lab Results  Component Value Date   HGBA1C 11.5* 08/17/2014   HGBA1C 11.1* 06/14/2014   She has been on an insulin pump before >> sugars higher (2000). She is interested to get back on this.   Pt is on a regimen of: - Lantus 30 units at 2 pm - Humalog 3x a day, before meals: 12 units + SSI   Pt checks her sugars 2-3 a day and they are - fluctuating: - am: 126-323 - 2h after b'fast: 48, 65, 189-332 - before lunch: 122, 191-266 - 2h after lunch: 271, 284 - before dinner: 305, 341 - 2h after dinner: n/c - bedtime: n/c - nighttime: 67, 75, 307 No lows. Lowest sugar was 27 in 07/2014, 48 recently; she has no hypoglycemia awareness! Highest sugar was 514.   No h/o DKA or hypoglycemia admissions.  Glucometer: OneTouch  Pt's meals are: - Breakfast: toast + PB and bacon - Lunch: chicken salad + fruit - may skip - Dinner: veggies + meat - Snacks: 0 Drinks diet SunDrop sodas.   For exercise, walks every other day.  - no CKD, last BUN/creatinine:  Lab Results  Component Value Date   BUN 7 08/16/2014   CREATININE 0.80 08/17/2014  She had a kidney inf >> now on [name]. - last set of lipids: Lab Results  Component Value Date   CHOL 162 08/17/2014   HDL 54 08/17/2014   LDLCALC 88 08/17/2014   TRIG 100 08/17/2014   CHOLHDL 3.0 08/17/2014  On Zocor. - last eye exam was in 09/16/2014. No DR.  - + numbness and tingling in her feet. Stopped Gabapentin b/c SEs.  Pt has no FH of DM1.  She also has a h/o HL. No h/o hypothyroidism.  ROS: Constitutional: + weight loss, + fatigue, + decreased appetite, no subjective hyperthermia/hypothermia Eyes: no  blurry vision, no xerophthalmia ENT: no sore throat, no nodules palpated in throat, no dysphagia/odynophagia, no hoarseness Cardiovascular: no CP/SOB/palpitations/leg swelling Respiratory: no cough/SOB Gastrointestinal: + N/no V/+ D/C Musculoskeletal: no muscle/joint aches Skin: no rashes Neurological: no tremors/numbness/tingling/dizziness Psychiatric: + both: depression/anxiety  Past Medical History  Diagnosis Date  . Diabetes mellitus without complication   . Neuropathy   . Depression   . GERD (gastroesophageal reflux disease)   . Hiatal hernia   . Migraine headache   . IBS (irritable bowel syndrome)   . Vertigo   . Hypertension   . Hyperlipidemia   . Anxiety   . Abdominal wall mass of right flank 04/11/13  . Abrasion of leg with infection 02/18/14  . Benign paroxysmal vertigo 09/04/13  . Bronchitis, acute 02/28/14  . Bunion of great toe 04/11/13  . Cervicalgia 06/13/14  . Dorsalgia 05/17/14  . Dysuria 03/27/14  . Gastroenteritis 01/28/14  . Gastro-esophageal reflux disease without esophagitis 01/28/14  . Pain in thoracic spine 06/13/14  . 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 Peripheral neuropathy 11/05/13  . Subungual hematoma of digit of hand   . Tendonitis 03/07/13  . Tobacco use    Past Surgical History  Procedure Laterality Date  . Cesarean section    . Tubal ligation    . Abdominal hysterectomy    .  Appendectomy    . Cholecystectomy    . Cyst excision      right breast  . Cardiac catheterization  2006    normal coronary arteries   History   Social History  . Marital Status: Divorced    Spouse Name: N/A  . Number of Children: 1   Occupational History  . disabled   Social History Main Topics  . Smoking status: Current Every Day Smoker -- 0.50 packs/day for 11 years  . Smokeless tobacco: Never Used     Comment: patient is aware that she needs to quit smoking  . Alcohol Use: No  . Drug Use: No   Current Outpatient Prescriptions on File Prior to Visit   Medication Sig Dispense Refill  . Albuterol Sulfate 108 (90 BASE) MCG/ACT AEPB Inhale 1 puff into the lungs every 4 (four) hours as needed.    . Alcohol Swabs (ALCOHOL PREP PAD) 70 % PADS   2  . aspirin EC 81 MG tablet Take 1 tablet (81 mg total) by mouth daily.    . clonazePAM (KLONOPIN) 1 MG tablet Take 1 mg by mouth at bedtime.    . docusate sodium (COLACE) 100 MG capsule Take 100 mg by mouth 2 (two) times daily.    Marland Kitchen escitalopram (LEXAPRO) 20 MG tablet Take 20 mg by mouth daily.    Marland Kitchen estradiol (ESTRACE) 0.5 MG tablet Take 0.5 mg by mouth every morning.    Marland Kitchen FIBER PO Take 1 capsule by mouth daily.    Marland Kitchen gabapentin (NEURONTIN) 400 MG capsule Take 400 mg by mouth 4 (four) times daily.    Marland Kitchen HYDROcodone-acetaminophen (NORCO) 10-325 MG per tablet Take 1 tablet by mouth every 6 (six) hours as needed for moderate pain.    Marland Kitchen insulin glargine (LANTUS SOLOSTAR) 100 UNIT/ML injection Inject 30 Units into the skin daily at 2 PM daily at 2 PM.     . insulin lispro (HUMALOG) 100 UNIT/ML injection Inject 12 Units into the skin 3 (three) times daily before meals. Also sliding scale    . lisinopril (PRINIVIL,ZESTRIL) 5 MG tablet Take 5 mg by mouth daily.    Marland Kitchen LORazepam (ATIVAN) 1 MG tablet Take 1 mg by mouth every 8 (eight) hours as needed for anxiety.    . meclizine (ANTIVERT) 25 MG tablet Take 25 mg by mouth every 6 (six) hours as needed.   0  . Multiple Vitamin (MULTIVITAMIN WITH MINERALS) TABS Take 1 tablet by mouth every morning.    . Omega-3 Fatty Acids (FISH OIL) 1000 MG CAPS Take 1 capsule by mouth daily.    . Hydrocodone-Acetaminophen 5-300 MG TABS   0  . simvastatin (ZOCOR) 40 MG tablet Take 40 mg by mouth daily.     No current facility-administered medications on file prior to visit.   Allergies  Allergen Reactions  . Clindamycin/Lincomycin Rash  . Codeine Rash  . Penicillins Rash  . Sulfa Antibiotics Rash        Family History  Problem Relation Age of Onset  . Stroke Mother 55  .  Heart attack Mother 37  . Cancer Sister     colon  . Heart failure Maternal Grandmother 65  . Cancer Maternal Grandfather     prostate  . Heart failure Paternal Grandmother 58  . Heart failure Paternal Grandfather 60   PE: BP 112/60 mmHg  Pulse 88  Temp(Src) 97.7 F (36.5 C) (Oral)  Resp 12  Ht '5\' 7"'  (1.702 m)  Wt 150 lb  9.6 oz (68.312 kg)  BMI 23.58 kg/m2  SpO2 98% Wt Readings from Last 3 Encounters:  10/28/14 150 lb 9.6 oz (68.312 kg)  09/20/14 155 lb (70.308 kg)  08/17/14 161 lb 3.2 oz (73.12 kg)   Constitutional: overweight, in NAD Eyes: PERRLA, EOMI, no exophthalmos ENT: moist mucous membranes, no thyromegaly, no cervical lymphadenopathy Cardiovascular: RRR, No MRG Respiratory: CTA B Gastrointestinal: abdomen soft, NT, ND, BS+ Musculoskeletal: no deformities, strength intact in all 4 Skin: moist, warm, no rashes Neurological: no tremor with outstretched hands, DTR normal in all 4  ASSESSMENT: 1. DM1, insulin-dependent, uncontrolled, with complications - cerebro-vascular ds - h/o TIA 0/2016 - PN  PLAN:  1. Patient with long-standing, uncontrolled DM1, on basal-bolus regimen, which is ineffective. She has low CBGs at night >> will reduce Lantus. Also, she has variability throughout the day >> will change the Humalog doses as a function of meal size. She needs a carb counting refresher so we can switch to ICR's. Eventually, she will need pre-pump training as she would like to get back on the insulin pump. - For now, I suggested to:  Patient Instructions  Please decrease Lantus to 26 units and move it in am. Please change the Humalog dose as follows: - 10 units before a smaller meal - 12 units before a regular meal - 14 units before a larger meal Continue SSI Humalog:  150-200: + 1 unit 201-250: + 2 units 251-300: + 3 units 301-350: + 4 units >350: + 5 units  Please schedule an appt with Antonieta Iba with nutrition >> for carb counting refresher.  Please return  in 1 month with your sugar log.   - continue checking sugars at different times of the day - check 4 times a day, rotating checks - given sugar log and advised how to fill it and to bring it at next appt  - given foot care handout and explained the principles  - given instructions for hypoglycemia management "15-15 rule"  - advised for yearly eye exams  - sent glucagon kit Rx to pharmacy - advised to get ketone strips - advised to always have Glu tablets with her - advised for a Med-alert bracelet mentioning "type 1 diabetes mellitus". - given instruction Re: exercising and driving in DM1 (pt instructions) - no signs of other autoimmune disorders - but needs b12, TSH, vit D at next visit - Return to clinic in 1 mo with sugar log   - time spent with the patient: 1 hour, of which >50% was spent in obtaining information about her disease, reviewing previous labs, office visit notes from PCP, and DM treatments, counseling pt about her condition (please see the discussed topics above), and developing a plan to prevent further hypoglycemia and hyperglycemia. We also discussed about proper diet. Pt had a number of questions which I addressed.

## 2014-10-28 NOTE — Patient Instructions (Signed)
Please decrease Lantus to 26 units and move it in am. Please change the Humalog dose as follows: - 10 units before a smaller meal - 12 units before a regular meal - 14 units before a larger meal Continue SSI Humalog:  150-200: + 1 unit 201-250: + 2 units 251-300: + 3 units 301-350: + 4 units >350: + 5 units  Please schedule an appt with Antonieta Iba with nutrition >> for carb counting refresher.  Please return in 1 month with your sugar log.   Basic Rules for Patients with Type I Diabetes Mellitus  1. The American Diabetes Association (ADA) recommended targets: - fasting sugar <130 - after meal sugar <180 - HbA1C <7%  2. Engage in ?150 min moderate exercise per week  3. Make sure you have ?8h of sleep every night as this helps both blood sugars and your weight.  4. Always keep a sugar log (not only record in your meter) and bring it to all appointments with [place].  5. If you are on a pump, know how to access the settings and to modify the parameters.  6.  Remember, you can always call the number on the back of the pump for emergencies related to the pump.  7. "15-15 rule" for hypoglycemia: if sugars are low, take 15 g of carbs** ("fast sugar" - e.g. 4 glucose tablets, 4 oz orange juice), wait 15 min, then check sugars again. If still <80, repeat. Continue  until your sugars >80, then eat a normal meal.   8. Teach family members and coworkers to inject glucagon. Have a glucagon set at home and one at work. They should call 911 after using the set.  9. If you are on a pump, set "insulin on board" time for 5 hours (if your sugars tend to be higher, can use 4 hours).   10. If you are on a pump, use the "dual wave bolus" setting for high fat foods (e.g. pizza). Start with a setting of 50%-50% (50% instant bolus and 50% prolonged bolus over 3h, for e.g.).    11. If you are on a pump, make sure the basal daily insulin dose is approximately equal (not larger) to the daily insulin you get  from boluses, otherwise you are at risk for hypoglycemia.  12. Check sugar before driving. If <100, correct, and only start driving if sugars rise ?100. Check sugar every hour when on a long drive.  13. Check sugar before exercising. If <100, correct, and only start exercising if sugars rise ?100. Check sugar every hour when on a long exercise routine and 1h after you finished exercising.   If >250, check urine for ketones. If you have moderate-large ketones in urine, do not start exercise. Hydrate yourself with clear liquids and correct the high sugar. Recheck sugars and ketones before attempting to exercise.  Be aware that you might need less insulin when exercising.  *intense, short, exercise bursts can increase your sugars, but  *less intense, longer (>1h), exercise routines can decrease your sugars.  If you are on a pump, you might need to decrease your basal rate by 10% or more (or even disconnect your pump) while you exercise to prevent low sugars. Do not disconnect your pump by more than 3 hours at a time! You also might need to decrease your insulin bolus for the meal prior to your exercise time by 20% or more.  14. Make sure you have a MedAlert bracelet or pendant mentioning "Type I Diabetes  Mellitus". If you have a prior episode of severe hypoglycemia or hypoglycemia unawareness, it should also mention this.  15. Please do not walk barefoot. Inspect your feet for sores/cuts and let us know if you have them.  16. Please call [facility] with any questions and concerns [phone]).   **E.g. of "fast carbs": ? first choice (15 g):  1 tube glucose gel, GlucoPouch 15, 2 oz glucose liquid ? second choice (15-16 g):  3 or 4 glucose tablets (best taken  with water), 15 Dextrose Bits chewable ? third choice (15-20 g):   cup fruit juice,  cup regular soda, 1 cup skim milk,  1 cup sports drink ? fourth choice (15-20 g):  1 small tube [place] gel (not frosting), 2  tbsp raisins, 1 tbsp table sugar,  candy, jelly beans, gum drops - check package for carb amount   (adapted from: Lenice Pressman. "Insulin therapy and hypoglycemia" Endocrinol Metab Clin N Am 2012, 41: 57-87)

## 2014-10-30 ENCOUNTER — Telehealth: Payer: Self-pay | Admitting: Internal Medicine

## 2014-10-30 NOTE — Telephone Encounter (Signed)
Patient would like to know if she should continue with her humalog while taking the lantus   Please advise patient    Thank you

## 2014-10-30 NOTE — Telephone Encounter (Signed)
Called pt and advised her per Dr Arman Filter note, she is to move Lantus to the AM and I clarified her Humalog doses. Pt understood.

## 2014-10-31 ENCOUNTER — Other Ambulatory Visit: Payer: Self-pay | Admitting: *Deleted

## 2014-10-31 MED ORDER — GLUCAGON (RDNA) 1 MG IJ KIT: 1.0000 mg | PACK | Freq: Once | INTRAMUSCULAR | Status: DC | PRN

## 2014-11-01 ENCOUNTER — Ambulatory Visit: Payer: Medicare Other | Admitting: Dietician

## 2014-11-04 ENCOUNTER — Other Ambulatory Visit: Payer: Self-pay | Admitting: *Deleted

## 2014-11-04 MED ORDER — GLUCAGON (RDNA) 1 MG IJ KIT: 1.0000 mg | PACK | Freq: Once | INTRAMUSCULAR | Status: DC | PRN

## 2014-11-07 ENCOUNTER — Encounter: Payer: Medicare Other | Attending: Internal Medicine | Admitting: Dietician

## 2014-11-07 ENCOUNTER — Encounter: Payer: Self-pay | Admitting: Dietician

## 2014-11-07 ENCOUNTER — Other Ambulatory Visit: Payer: Self-pay | Admitting: *Deleted

## 2014-11-07 VITALS — Ht 67.0 in | Wt 148.0 lb

## 2014-11-07 DIAGNOSIS — E1040 Type 1 diabetes mellitus with diabetic neuropathy, unspecified: Secondary | ICD-10-CM | POA: Diagnosis not present

## 2014-11-07 DIAGNOSIS — E1042 Type 1 diabetes mellitus with diabetic polyneuropathy: Secondary | ICD-10-CM

## 2014-11-07 DIAGNOSIS — Z794 Long term (current) use of insulin: Secondary | ICD-10-CM | POA: Insufficient documentation

## 2014-11-07 DIAGNOSIS — E1065 Type 1 diabetes mellitus with hyperglycemia: Secondary | ICD-10-CM

## 2014-11-07 DIAGNOSIS — Z713 Dietary counseling and surveillance: Secondary | ICD-10-CM | POA: Diagnosis not present

## 2014-11-07 MED ORDER — ONETOUCH DELICA LANCETS FINE MISC: Status: DC

## 2014-11-07 MED ORDER — GLUCOSE BLOOD VI STRP: ORAL_STRIP | Status: DC

## 2014-11-07 NOTE — Progress Notes (Signed)
  Medical Nutrition Therapy:  Appt start time: 1740 end time:  8144.   Assessment:  Primary concerns today: Patient is here alone today for a refresher on carbohydrate counting.  She is considering restarting on a pump.  Had a pump in 2000 but ran higher blood sugars at that time.  She no longer has hypoglycemia awareness.  HgbA1C of 11.5% 08/17/14 stable from last check.  She checks her blood sugar 4-6 times per day, low of 46 and high of 300. Patient reports weight loss of 40 lbs in the past year secondary to poor appetite.  She was diagnosed with type 1 diabetes at age 44 and has complications of peripheral neuropathy. She takes Lantus each am and Humalog 10 units for small meals and 12 units for large meals with sliding scale for blood sugar as well.   Patient lives alone.  Patient is on disability.  Depression but patient reports that this is controlled at this time.  Preferred Learning Style:   No preference indicated    Learning Readiness:   Ready  MEDICATIONS: See list to include Humalog and Lantus.   DIETARY INTAKE: Tries to avoid bread.  No snacking unless sugar is low. 24-hr recall:  B (8 AM): egg, bacon, yogurt  Snk ( AM): none L (1 PM): carrots/broccoli with ranch dressing and fruit, or occasional sandwich with fruit Snk ( PM): D (6-7 PM): spaghetti, salad or chicken, veges, and occasional starch Snk ( PM): depends on blood sugar.  Maybe yogurt, fruit, or peanut butter crackers Beverages: water, diet sundrop, crystal light  Usual physical activity: walk  Estimated energy needs: 1600 calories 180 g carbohydrates 100 g protein 53 g fat  Progress Towards Goal(s):  In progress.   Nutritional Diagnosis:  NB-1.1 Food and nutrition-related knowledge deficit As related to what foods have carbohydrate and carbohydrate counting.  As evidenced by patient report.    Intervention:  Nutrition Education regarding carbohydrate counting by food group as well as more detailed data.   Reviewed Rule of 15 for treatment of hypoglycermia as patient couldn't verbalize.    Teaching Method Utilized:  Visual Auditory Hands on  Handouts given during visit include:  Meal plan card  Snack list  Label reading  Information for the type 1 diabetes support group  References/apps for carb counting  Barriers to learning/adherence to lifestyle change: poor appetite  Demonstrated degree of understanding via:  Teach Back   Monitoring/Evaluation:  Dietary intake, exercise, label reading, and body weight prn.

## 2014-11-07 NOTE — Patient Instructions (Signed)
Have regularly scheduled meals.  Please do not skip meals. Check your blood sugar prior to exercise.  Eat a snack with protein prior to exercise as needed to avoid low blood sugar. Aim for 45 grams of carbohydrates with all meals. Aim for up to 30 grams of carbohydrates with snack when hungry or before bed. Protein with all meals and snacks. Consider the type 1 diabetes support group. Calorie [employer].com or book for detailed carb list.  Remember the Rule of 15 for treatment of low blood sugar. <80 take 15 grams of carbs (4 glucose tabs or 1/2 cup juice or regular soda) then recheck in 15 minutes.  If still low then repeat with 15 more grams of carbs and recheck in 15 minutes.  When normal eat a meal or snack.

## 2014-11-11 ENCOUNTER — Telehealth: Payer: Self-pay | Admitting: Internal Medicine

## 2014-11-11 NOTE — Telephone Encounter (Signed)
Please read message below and advise.  

## 2014-11-11 NOTE — Telephone Encounter (Signed)
Please call in a rx for cgm to see if this will be covered please add note to rx stating due to no hypoglycemia awareness

## 2014-11-11 NOTE — Telephone Encounter (Signed)
[name], see below - do you know how to do this? Thank you, C

## 2014-11-13 ENCOUNTER — Telehealth: Payer: Self-pay | Admitting: Internal Medicine

## 2014-11-13 ENCOUNTER — Other Ambulatory Visit: Payer: Self-pay | Admitting: Internal Medicine

## 2014-11-13 DIAGNOSIS — E1065 Type 1 diabetes mellitus with hyperglycemia: Secondary | ICD-10-CM

## 2014-11-13 DIAGNOSIS — E1042 Type 1 diabetes mellitus with diabetic polyneuropathy: Secondary | ICD-10-CM

## 2014-11-13 NOTE — Telephone Encounter (Signed)
Will refer to DM education for pre-pump training. Will need to decide or a pump first before deciding what CGM to use. We do not need to start the pump yet, but just to see the options.

## 2014-11-13 NOTE — Telephone Encounter (Signed)
Please read message below and advise.  

## 2014-11-13 NOTE — Telephone Encounter (Signed)
Called pt and lvm advising her per Dr Arman Filter message below. Advised pt to call back with any questions.

## 2014-11-13 NOTE — Telephone Encounter (Signed)
Please call in a rx for cgm, she stated that it was covered.

## 2014-11-20 ENCOUNTER — Encounter: Payer: Self-pay | Admitting: Internal Medicine

## 2014-11-26 ENCOUNTER — Encounter: Payer: Medicare Other | Admitting: Nutrition

## 2014-11-26 DIAGNOSIS — E1040 Type 1 diabetes mellitus with diabetic neuropathy, unspecified: Secondary | ICD-10-CM | POA: Diagnosis not present

## 2014-11-26 DIAGNOSIS — E1065 Type 1 diabetes mellitus with hyperglycemia: Principal | ICD-10-CM

## 2014-11-26 DIAGNOSIS — E1042 Type 1 diabetes mellitus with diabetic polyneuropathy: Secondary | ICD-10-CM

## 2014-12-03 NOTE — Progress Notes (Signed)
This patient is wanting a new pump.  She is not on one now, but wants to see what new models are out there.  She was shown all of the models, and we discussed the advantages and disadvantages of each model.  She was given brochures on the [place], the Medtronic and Omni-Pod.     She reports good understanding of carb counting, and had no questions about this.     She wants to try the trail for the OmniPod for  six weeks.  Paperwork was filled out and faxed.  The Omni-Pod rep was left a message about this patient's willingness to go on a trial of this pump.

## 2014-12-06 ENCOUNTER — Ambulatory Visit (INDEPENDENT_AMBULATORY_CARE_PROVIDER_SITE_OTHER): Payer: Medicare Other | Admitting: Internal Medicine

## 2014-12-06 VITALS — BP 114/68 | HR 92 | Temp 98.1°F | Resp 12 | Wt 146.4 lb

## 2014-12-06 DIAGNOSIS — E1040 Type 1 diabetes mellitus with diabetic neuropathy, unspecified: Secondary | ICD-10-CM

## 2014-12-06 DIAGNOSIS — E1065 Type 1 diabetes mellitus with hyperglycemia: Secondary | ICD-10-CM

## 2014-12-06 DIAGNOSIS — E1042 Type 1 diabetes mellitus with diabetic polyneuropathy: Secondary | ICD-10-CM

## 2014-12-06 LAB — TSH: TSH: 1.15 u[IU]/mL (ref 0.35–4.50)

## 2014-12-06 LAB — HEMOGLOBIN A1C: HEMOGLOBIN A1C: 9.3 % — AB (ref 4.6–6.5)

## 2014-12-06 NOTE — Progress Notes (Signed)
Patient ID: [name], female   DOB: 04-13-1971, 44 y.o.   MRN: [id number]  HPI: [name] is a 44 y.o.-year-old female, returning for f/u for DM1, dx in 68 (44 y/o), uncontrolled, with complications (cerebro-vascular ds - h/o TIA 0/2016, PN). Last visit 1.5 mo ago.  She stopped smoking 12/01/2014.   Last hemoglobin A1c was: Lab Results  Component Value Date   HGBA1C 11.5* 08/17/2014   HGBA1C 11.1* 06/14/2014   She has been on an insulin pump before >> sugars higher (2000). She is interested to get back on this. Omnipod not covered >> will try Medtronic.  Pt is on a regimen of: - Lantus to 26 units in am - Humalog dose as follows: - 10 units before a smaller meal - 12 units before a regular meal - 14 units before a larger meal - SSI Humalog:  150-200: + 1 unit 201-250: + 2 units 251-300: + 3 units 301-350: + 4 units >350: + 5 units   Pt checks her sugars 4 a day and they are - fluctuating, but improved. Lows later in the day: - am: 126-323 >> 117, 195-300 - 2h after b'fast: 48, 65, 189-332 >> n/c - before lunch: 122, 191-266 >> 49-144 - 2h after lunch: 271, 284 >> n/c - before dinner: 305, 341 >> 44, 71-120, 193, 252 - 2h after dinner: n/c - bedtime: n/c >> 52, 84, 114-150, 200 x2 - nighttime: 67, 75, 307 No lows. Lowest sugar was 27 in 07/2014,  recently; she has no hypoglycemia awareness! Highest sugar was 514 >> 300s  No h/o DKA or hypoglycemia admissions.  Glucometer: OneTouch  Pt's meals are: - Breakfast: toast + PB and bacon - Lunch: chicken salad + fruit - may skip - Dinner: veggies + meat - Snacks: 0 Drinks diet SunDrop sodas.   For exercise, walks every other day.  - no CKD, last BUN/creatinine:  Lab Results  Component Value Date   BUN 7 08/16/2014   CREATININE 0.80 08/17/2014   - last set of lipids: Lab Results  Component Value Date   CHOL 162 08/17/2014   HDL 54 08/17/2014   LDLCALC 88 08/17/2014   TRIG 100 08/17/2014    CHOLHDL 3.0 08/17/2014  On Zocor. - last eye exam was in 09/16/2014. No DR.  - + numbness and tingling in her feet. Stopped Gabapentin b/c SEs.  She also has a h/o HL. No h/o hypothyroidism.  ROS: Constitutional: + weight loss, no fatigue, no subjective hyperthermia/hypothermia Eyes: no blurry vision, no xerophthalmia ENT: no sore throat, no nodules palpated in throat, no dysphagia/odynophagia, no hoarseness Cardiovascular: + CP/no SOB/palpitations/leg swelling Respiratory: no cough/SOB Gastrointestinal: + N/no V/D/C Musculoskeletal: no muscle/joint aches Skin: no rashes Neurological: no tremors/numbness/tingling/dizziness  I reviewed pt's medications, allergies, PMH, social hx, family hx, and changes were documented in the history of present illness. Otherwise, unchanged from my initial visit note:  Past Medical History  Diagnosis Date  . Diabetes mellitus without complication   . Neuropathy   . Depression   . GERD (gastroesophageal reflux disease)   . Hiatal hernia   . Migraine headache   . IBS (irritable bowel syndrome)   . Vertigo   . Hypertension   . Hyperlipidemia   . Anxiety   . Abdominal wall mass of right flank 04/11/13  . Abrasion of leg with infection 02/18/14  . Benign paroxysmal vertigo 09/04/13  . Bronchitis, acute 02/28/14  . Bunion of great toe 04/11/13  . Cervicalgia 06/13/14  .  Dorsalgia 05/17/14  . Dysuria 03/27/14  . Gastroenteritis 01/28/14  . Gastro-esophageal reflux disease without esophagitis 01/28/14  . Pain in thoracic spine 06/13/14  . 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 Peripheral neuropathy 11/05/13  . Subungual hematoma of digit of hand   . Tendonitis 03/07/13  . Tobacco use    Past Surgical History  Procedure Laterality Date  . Cesarean section    . Tubal ligation    . Abdominal hysterectomy    . Appendectomy    . Cholecystectomy    . Cyst excision      right breast  . Cardiac catheterization  2006    normal coronary arteries    History   Social History  . Marital Status: Divorced    Spouse Name: N/A  . Number of Children: 1   Occupational History  . disabled   Social History Main Topics  . Smoking status: Current Every Day Smoker -- 0.50 packs/day for 11 years  . Smokeless tobacco: Never Used     Comment: patient is aware that she needs to quit smoking  . Alcohol Use: No  . Drug Use: No   Current Outpatient Prescriptions on File Prior to Visit  Medication Sig Dispense Refill  . Albuterol Sulfate 108 (90 BASE) MCG/ACT AEPB Inhale 1 puff into the lungs every 4 (four) hours as needed.    . Alcohol Swabs (ALCOHOL PREP PAD) 70 % PADS   2  . aspirin EC 81 MG tablet Take 1 tablet (81 mg total) by mouth daily.    . clonazePAM (KLONOPIN) 1 MG tablet Take 1 mg by mouth at bedtime.    . docusate sodium (COLACE) 100 MG capsule Take 100 mg by mouth 2 (two) times daily.    Marland Kitchen escitalopram (LEXAPRO) 20 MG tablet Take 20 mg by mouth daily.    Marland Kitchen estradiol (ESTRACE) 0.5 MG tablet Take 0.5 mg by mouth every morning.    Marland Kitchen FIBER PO Take 1 capsule by mouth daily.    Marland Kitchen gabapentin (NEURONTIN) 400 MG capsule Take 400 mg by mouth 4 (four) times daily.    Marland Kitchen glucagon (GLUCAGON EMERGENCY) 1 MG injection Inject 1 mg into the muscle once as needed. (Patient not taking: Reported on 11/07/2014) 1 each 1  . glucose blood (ONETOUCH VERIO) test strip Use to test blood sugar 4-6 times daily as instructed. Dx: E10.40 550 each 3  . HYDROcodone-acetaminophen (NORCO) 10-325 MG per tablet Take 1 tablet by mouth every 6 (six) hours as needed for moderate pain.    Marland Kitchen Hydrocodone-Acetaminophen 5-300 MG TABS   0  . insulin glargine (LANTUS SOLOSTAR) 100 UNIT/ML injection Inject 26 Units into the skin daily at 2 PM daily at 2 PM.    . insulin lispro (HUMALOG) 100 UNIT/ML injection Inject 12 Units into the skin 3 (three) times daily before meals. Also sliding scale    . lisinopril (PRINIVIL,ZESTRIL) 5 MG tablet Take 5 mg by mouth daily.    Marland Kitchen LORazepam  (ATIVAN) 1 MG tablet Take 1 mg by mouth every 8 (eight) hours as needed for anxiety.    . meclizine (ANTIVERT) 25 MG tablet Take 25 mg by mouth every 6 (six) hours as needed.   0  . Multiple Vitamin (MULTIVITAMIN WITH MINERALS) TABS Take 1 tablet by mouth every morning.    . Omega-3 Fatty Acids (FISH OIL) 1000 MG CAPS Take 1 capsule by mouth daily.    Glory Rosebush DELICA LANCETS FINE MISC Use to test blood sugar  4-6 times daily as instructed. Dx: E10.40 600 each 3  . simvastatin (ZOCOR) 40 MG tablet Take 40 mg by mouth daily.     No current facility-administered medications on file prior to visit.   Allergies  Allergen Reactions  . Clindamycin/Lincomycin Rash  . Codeine Rash  . Penicillins Rash  . Sulfa Antibiotics Rash        Family History  Problem Relation Age of Onset  . Stroke Mother 40  . Heart attack Mother 20  . Cancer Sister     colon  . Heart failure Maternal Grandmother 65  . Cancer Maternal Grandfather     prostate  . Heart failure Paternal Grandmother 62  . Heart failure Paternal Grandfather 60   PE: BP 114/68 mmHg  Pulse 92  Temp(Src) 98.1 F (36.7 C) (Oral)  Resp 12  Wt 146 lb 6.4 oz (66.407 kg)  SpO2 95% Body mass index is 22.92 kg/(m^2). Wt Readings from Last 3 Encounters:  12/06/14 146 lb 6.4 oz (66.407 kg)  11/07/14 148 lb (67.132 kg)  10/28/14 150 lb 9.6 oz (68.312 kg)   Constitutional: normal weight, in NAD Eyes: PERRLA, EOMI, no exophthalmos ENT: moist mucous membranes, no thyromegaly, no cervical lymphadenopathy Cardiovascular: RRR, No MRG Respiratory: CTA B Gastrointestinal: abdomen soft, NT, ND, BS+ Musculoskeletal: no deformities, strength intact in all 4 Skin: moist, warm, no rashes Neurological: no tremor with outstretched hands, DTR normal in all 4  ASSESSMENT: 1. DM1, insulin-dependent, uncontrolled, with complications - cerebro-vascular ds - h/o TIA 0/2016 - PN  PLAN:  1. Patient with long-standing, uncontrolled DM1, on  basal-bolus regimen, which is still not completely effective. She has high CBGs in am >> lows later in the day. She had a carb counting refresher and she also had the pre-pump training >> would have liked an Omnipod, but not approved by her insurance >> will try Medtronic >> given contact info and brochure. We will switch to ICR's (will increase this from ~ 1:4 to 1:6. Will also reduce target. - I suggested to:  Patient Instructions  Please change: - Lantus to 26 units in am - Humalog - ICR 1:6 - SSI Humalog:  Target: 130  ISF: 50 <60: - 2 units 60-80: - 1 units 80-130: +0 units 131-180: + 1 unit 181-230: + 3 units 231-280: + 3 units 281-320: + 4 units >320: + 5 units  Please stop at the lab.  Please come back for a follow-up appointment in 2 months.   - continue checking sugars at different times of the day - check 4 times a day, rotating checks >> doing great with this! - advised for yearly eye exams  - no signs of other autoimmune disorders - will check b12, TSH, vit D  - check HbA1c today - Return to clinic in 2 mo with sugar log   Office Visit on 12/06/2014  Component Date Value Ref Range Status  . Hgb A1c MFr Bld 12/06/2014 9.3* 4.6 - 6.5 % Final   Glycemic Control Guidelines for People with Diabetes:Non Diabetic:  <6%Goal of Therapy: <7%Additional Action Suggested:  >8%   . Vitamin B-12 12/06/2014 481  211 - 911 pg/mL Final  . Methylmalonic Acid, Quant 12/06/2014 125  87 - 318 nmol/L Final  . VITD 12/06/2014 40.34  30.00 - 100.00 ng/mL Final  . TSH 12/06/2014 1.15  0.35 - 4.50 uIU/mL Final  Hemoglobin A1c has improved. The rest of the labs are normal.

## 2014-12-06 NOTE — Patient Instructions (Signed)
Please change: - Lantus to 26 units in am - Humalog - ICR 1:6 - SSI Humalog:  Target: 130  ISF: 50 <60: - 2 units 60-80: - 1 units 80-130: +0 units 131-180: + 1 unit 181-230: + 3 units 231-280: + 3 units 281-320: + 4 units >320: + 5 units  Please stop at the lab.  Please come back for a follow-up appointment in 2 months.

## 2014-12-09 ENCOUNTER — Encounter: Payer: Self-pay | Admitting: Internal Medicine

## 2014-12-09 LAB — VITAMIN D 25 HYDROXY (VIT D DEFICIENCY, FRACTURES): VITD: 40.34 ng/mL (ref 30.00–100.00)

## 2014-12-09 LAB — METHYLMALONIC ACID, SERUM: Methylmalonic Acid, Quant: 125 nmol/L (ref 87–318)

## 2014-12-09 LAB — VITAMIN B12: Vitamin B-12: 481 pg/mL (ref 211–911)

## 2014-12-12 ENCOUNTER — Telehealth: Payer: Self-pay | Admitting: Internal Medicine

## 2014-12-12 NOTE — Telephone Encounter (Signed)
Patient need prescription insulin for insulin pump free strips, patient would like to know when she need to come for an appt. Please advise

## 2014-12-12 NOTE — Telephone Encounter (Signed)
Called pt and lvm advising her to call me and let me know what she needs.

## 2014-12-12 NOTE — Telephone Encounter (Signed)
Pt returned call. She stated that she was approved for the OmniPod. They said they would be mailing it and she will receive it in about 3 business days. Pt advised that she will need insulin and test strips. Advised pt to call when she receives her pump and set up an appt to see Leonia Reader. Advised pt that we will order insulin and strips when she comes in. Be advised.

## 2014-12-17 NOTE — Telephone Encounter (Signed)
Appt was made for pump start next Tuesday.

## 2014-12-21 ENCOUNTER — Emergency Department (HOSPITAL_COMMUNITY)
Admission: EM | Admit: 2014-12-21 | Discharge: 2014-12-21 | Disposition: A | Discharge status: Home / Self Care | Payer: Medicare Other | Attending: Emergency Medicine | Admitting: Emergency Medicine

## 2014-12-21 ENCOUNTER — Encounter (HOSPITAL_COMMUNITY): Payer: Self-pay

## 2014-12-21 ENCOUNTER — Emergency Department (HOSPITAL_COMMUNITY): Payer: Medicare Other

## 2014-12-21 DIAGNOSIS — I959 Hypotension, unspecified: Secondary | ICD-10-CM | POA: Diagnosis not present

## 2014-12-21 DIAGNOSIS — Z872 Personal history of diseases of the skin and subcutaneous tissue: Secondary | ICD-10-CM | POA: Diagnosis not present

## 2014-12-21 DIAGNOSIS — F329 Major depressive disorder, single episode, unspecified: Secondary | ICD-10-CM | POA: Insufficient documentation

## 2014-12-21 DIAGNOSIS — I10 Essential (primary) hypertension: Secondary | ICD-10-CM | POA: Insufficient documentation

## 2014-12-21 DIAGNOSIS — F419 Anxiety disorder, unspecified: Secondary | ICD-10-CM | POA: Insufficient documentation

## 2014-12-21 DIAGNOSIS — E1165 Type 2 diabetes mellitus with hyperglycemia: Secondary | ICD-10-CM | POA: Insufficient documentation

## 2014-12-21 DIAGNOSIS — Z8719 Personal history of other diseases of the digestive system: Secondary | ICD-10-CM | POA: Insufficient documentation

## 2014-12-21 DIAGNOSIS — Z720 Tobacco use: Secondary | ICD-10-CM | POA: Insufficient documentation

## 2014-12-21 DIAGNOSIS — G43909 Migraine, unspecified, not intractable, without status migrainosus: Secondary | ICD-10-CM | POA: Diagnosis not present

## 2014-12-21 DIAGNOSIS — Z8709 Personal history of other diseases of the respiratory system: Secondary | ICD-10-CM | POA: Insufficient documentation

## 2014-12-21 DIAGNOSIS — Z794 Long term (current) use of insulin: Secondary | ICD-10-CM | POA: Insufficient documentation

## 2014-12-21 DIAGNOSIS — Z79899 Other long term (current) drug therapy: Secondary | ICD-10-CM | POA: Diagnosis not present

## 2014-12-21 DIAGNOSIS — R739 Hyperglycemia, unspecified: Secondary | ICD-10-CM

## 2014-12-21 DIAGNOSIS — R0602 Shortness of breath: Secondary | ICD-10-CM

## 2014-12-21 LAB — URINALYSIS, ROUTINE W REFLEX MICROSCOPIC
Bilirubin Urine: NEGATIVE
HGB URINE DIPSTICK: NEGATIVE
Ketones, ur: NEGATIVE mg/dL
Leukocytes, UA: NEGATIVE
NITRITE: NEGATIVE
PH: 6.5 (ref 5.0–8.0)
Protein, ur: NEGATIVE mg/dL
Specific Gravity, Urine: <1.005 — ABNORMAL LOW (ref 1.005–1.030)
UROBILINOGEN UA: 0.2 mg/dL (ref 0.0–1.0)

## 2014-12-21 LAB — BASIC METABOLIC PANEL
Anion gap: 7 (ref 5–15)
BUN: 13 mg/dL (ref 6–20)
CHLORIDE: 91 mmol/L — AB (ref 101–111)
CO2: 30 mmol/L (ref 22–32)
Calcium: 8.9 mg/dL (ref 8.9–10.3)
Creatinine, Ser: 0.82 mg/dL (ref 0.44–1.00)
GFR calc Af Amer: >60 mL/min (ref >60)
Glucose, Bld: 698 mg/dL (ref 65–99)
Potassium: 4.6 mmol/L (ref 3.5–5.1)
SODIUM: 128 mmol/L — AB (ref 135–145)

## 2014-12-21 LAB — CBC WITH DIFFERENTIAL/PLATELET
Basophils Absolute: 0.1 10*3/uL (ref 0.0–0.1)
Basophils Relative: 1 % (ref 0–1)
Eosinophils Absolute: 0.3 10*3/uL (ref 0.0–0.7)
Eosinophils Relative: 4 % (ref 0–5)
HCT: 39.4 % (ref 36.0–46.0)
Hemoglobin: 13.2 g/dL (ref 12.0–15.0)
LYMPHS ABS: 3.2 10*3/uL (ref 0.7–4.0)
LYMPHS PCT: 47 % — AB (ref 12–46)
MCH: 30.7 pg (ref 26.0–34.0)
MCHC: 33.5 g/dL (ref 30.0–36.0)
MCV: 91.6 fL (ref 78.0–100.0)
MONOS PCT: 6 % (ref 3–12)
Monocytes Absolute: 0.4 10*3/uL (ref 0.1–1.0)
Neutro Abs: 2.9 10*3/uL (ref 1.7–7.7)
Neutrophils Relative %: 42 % — ABNORMAL LOW (ref 43–77)
Platelets: 205 10*3/uL (ref 150–400)
RBC: 4.3 MIL/uL (ref 3.87–5.11)
RDW: 12.6 % (ref 11.5–15.5)
WBC: 6.8 10*3/uL (ref 4.0–10.5)

## 2014-12-21 LAB — CBG MONITORING, ED
GLUCOSE-CAPILLARY: 123 mg/dL — AB (ref 65–99)
GLUCOSE-CAPILLARY: 516 mg/dL — AB (ref 65–99)
GLUCOSE-CAPILLARY: 571 mg/dL — AB (ref 65–99)
GLUCOSE-CAPILLARY: 60 mg/dL — AB (ref 65–99)
Glucose-Capillary: 235 mg/dL — ABNORMAL HIGH (ref 65–99)

## 2014-12-21 LAB — URINE MICROSCOPIC-ADD ON

## 2014-12-21 LAB — TROPONIN I

## 2014-12-21 MED ORDER — PROMETHAZINE HCL 25 MG PO TABS: 25.0000 mg | ORAL_TABLET | Freq: Four times a day (QID) | ORAL | Status: DC | PRN

## 2014-12-21 MED ORDER — SODIUM CHLORIDE 0.9 % IV BOLUS (SEPSIS)
1000.0000 mL | Freq: Once | INTRAVENOUS | Status: AC
  Administered 2014-12-21: 1000 mL via INTRAVENOUS

## 2014-12-21 MED ORDER — SODIUM CHLORIDE 0.9 % IV SOLN: INTRAVENOUS | Status: DC

## 2014-12-21 MED ORDER — PROMETHAZINE HCL 25 MG/ML IJ SOLN
25.0000 mg | Freq: Once | INTRAMUSCULAR | Status: AC
  Administered 2014-12-21: 25 mg via INTRAVENOUS
  Filled 2014-12-21: qty 1

## 2014-12-21 MED ORDER — INSULIN ASPART 100 UNIT/ML ~~LOC~~ SOLN
10.0000 [IU] | Freq: Once | SUBCUTANEOUS | Status: AC
  Administered 2014-12-21: 10 [IU] via INTRAVENOUS
  Filled 2014-12-21: qty 1

## 2014-12-21 MED ORDER — SODIUM CHLORIDE 0.9 % IV SOLN
INTRAVENOUS | Status: DC
  Administered 2014-12-21: 4.6 [IU]/h via INTRAVENOUS
  Filled 2014-12-21: qty 2.5

## 2014-12-21 NOTE — ED Notes (Signed)
Blood sugar running high for 2 days, states she feels dizzy and lightheaded at times, mild nausea

## 2014-12-21 NOTE — ED Notes (Signed)
CRITICAL VALUE ALERT  Critical value received:  Glucose 698  Date of notification:  12/21/2014  Time of notification:  0116  Critical value read back:yes  Nurse who received alert:  Bobbye Morton  MD notified (1st page):    Time of first page:  0116  MD notified (2nd page):  Time of second page:  Responding MD:  ward  Time MD responded:  0116

## 2014-12-21 NOTE — ED Notes (Signed)
Gave pt a Coke

## 2014-12-21 NOTE — ED Provider Notes (Addendum)
TIME SEEN: 12:28 AM   CHIEF COMPLAINT: high blood sugar  HPI:  Landa Mullinax is a 44 y.o. female with a history of IDDM, HLD and HTN who presents to the Emergency Department complaining of high blood sugar onset 2 days ago. Patient reports associated intermittent lightheadedness, fatigue, SOB, pelvic pressure, frequency, and mild nausea. She reports her blood sugar to be 533 PTA. She states that she is going to get an insulin pump next week. She denies fever, vomiting, cough, chest pain, and diarrhea. She states that she has not had DKA since she was a child. She reports her normal BP to be 116/70. She states that she takes Lisinopril for her kidneys. She reports family history of heart problems.   ROS: See HPI Constitutional: no fever  Eyes: no drainage  ENT: no runny nose   Cardiovascular:  no chest pain  Resp: no SOB  GI: no vomiting GU: no dysuria Integumentary: no rash  Allergy: no hives  Musculoskeletal: no leg swelling  Neurological: no slurred speech ROS otherwise negative  PAST MEDICAL HISTORY/PAST SURGICAL HISTORY:  Past Medical History  Diagnosis Date  . Diabetes mellitus without complication   . Neuropathy   . Depression   . GERD (gastroesophageal reflux disease)   . Hiatal hernia   . Migraine headache   . IBS (irritable bowel syndrome)   . Vertigo   . Hypertension   . Hyperlipidemia   . Anxiety   . Abdominal wall mass of right flank 04/11/13  . Abrasion of leg with infection 02/18/14  . Benign paroxysmal vertigo 09/04/13  . Bronchitis, acute 02/28/14  . Bunion of great toe 04/11/13  . Cervicalgia 06/13/14  . Dorsalgia 05/17/14  . Dysuria 03/27/14  . Gastroenteritis 01/28/14  . Gastro-esophageal reflux disease without esophagitis 01/28/14  . Pain in thoracic spine 06/13/14  . 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 Peripheral neuropathy 11/05/13  . Subungual hematoma of digit of hand   . Tendonitis 03/07/13  . Tobacco use     MEDICATIONS:  Prior to  Admission medications   Medication Sig Start Date End Date Taking? Authorizing Provider  Alcohol Swabs (ALCOHOL PREP PAD) 70 % PADS  06/07/14  Yes Historical Provider, MD  aspirin EC 81 MG tablet Take 1 tablet (81 mg total) by mouth daily. 08/18/14  Yes Marianne L York, PA-C  clonazePAM (KLONOPIN) 1 MG tablet Take 1 mg by mouth at bedtime.   Yes Historical Provider, MD  docusate sodium (COLACE) 100 MG capsule Take 100 mg by mouth 2 (two) times daily.   Yes Historical Provider, MD  escitalopram (LEXAPRO) 20 MG tablet Take 20 mg by mouth daily.   Yes Historical Provider, MD  estradiol (ESTRACE) 0.5 MG tablet Take 0.5 mg by mouth every morning.   Yes Historical Provider, MD  FIBER PO Take 1 capsule by mouth daily.   Yes Historical Provider, MD  gabapentin (NEURONTIN) 400 MG capsule Take 400 mg by mouth 4 (four) times daily.   Yes Historical Provider, MD  HYDROcodone-acetaminophen (NORCO) 10-325 MG per tablet Take 1 tablet by mouth every 6 (six) hours as needed for moderate pain.   Yes Historical Provider, MD  Hydrocodone-Acetaminophen 5-300 MG TABS  08/28/14  Yes Historical Provider, MD  insulin glargine (LANTUS SOLOSTAR) 100 UNIT/ML injection Inject 26 Units into the skin daily at 2 PM daily at 2 PM.   Yes Historical Provider, MD  insulin lispro (HUMALOG) 100 UNIT/ML injection Inject 12 Units into the skin 3 (three)  times daily before meals. Also sliding scale   Yes Historical Provider, MD  lisinopril (PRINIVIL,ZESTRIL) 5 MG tablet Take 5 mg by mouth daily.   Yes Historical Provider, MD  LORazepam (ATIVAN) 1 MG tablet Take 1 mg by mouth every 8 (eight) hours as needed for anxiety.   Yes Historical Provider, MD  meclizine (ANTIVERT) 25 MG tablet Take 25 mg by mouth every 6 (six) hours as needed.  08/05/14  Yes Historical Provider, MD  Multiple Vitamin (MULTIVITAMIN WITH MINERALS) TABS Take 1 tablet by mouth every morning.   Yes Historical Provider, MD  Omega-3 Fatty Acids (FISH OIL) 1000 MG CAPS Take 1  capsule by mouth daily.   Yes Historical Provider, MD  Albuterol Sulfate 108 (90 BASE) MCG/ACT AEPB Inhale 1 puff into the lungs every 4 (four) hours as needed.    Historical Provider, MD  glucagon (GLUCAGON EMERGENCY) 1 MG injection Inject 1 mg into the muscle once as needed. Patient not taking: Reported on 11/07/2014 11/04/14   Philemon Kingdom, MD  glucose blood (ONETOUCH VERIO) test strip Use to test blood sugar 4-6 times daily as instructed. Dx: E10.40 11/07/14   Philemon Kingdom, MD  [facility] DELICA LANCETS FINE MISC Use to test blood sugar 4-6 times daily as instructed. Dx: E10.40 11/07/14   Philemon Kingdom, MD  simvastatin (ZOCOR) 40 MG tablet Take 40 mg by mouth daily.    Historical Provider, MD    ALLERGIES:  Allergies  Allergen Reactions  . Clindamycin/Lincomycin Rash  . Codeine Rash  . Penicillins Rash  . Sulfa Antibiotics Rash         SOCIAL HISTORY:  History  Substance Use Topics  . Smoking status: Current Every Day Smoker -- 0.50 packs/day for 11 years  . Smokeless tobacco: Never Used     Comment: patient is aware that she needs to quit smoking  . Alcohol Use: No    FAMILY HISTORY: Family History  Problem Relation Age of Onset  . Stroke Mother 36  . Heart attack Mother 23  . Cancer Sister     colon  . Heart failure Maternal Grandmother 65  . Cancer Maternal Grandfather     prostate  . Heart failure Paternal Grandmother 82  . Heart failure Paternal Grandfather 60    EXAM: BP 89/55 mmHg  Pulse 62  Temp(Src) 97.8 F (36.6 C) (Oral)  Resp 16  Ht 5\' 7"  (1.702 m)  Wt 140 lb (63.504 kg)  BMI 21.92 kg/m2  SpO2 97% CONSTITUTIONAL: Alert and oriented and responds appropriately to questions. Well-appearing; well-nourished, nontoxic HEAD: Normocephalic EYES: Conjunctivae clear, PERRL ENT: normal nose; no rhinorrhea; slightly dry mucous membranes; pharynx without lesions noted NECK: Supple, no meningismus, no LAD  CARD: RRR; S1 and S2 appreciated; no murmurs, no  clicks, no rubs, no gallops RESP: Normal chest excursion without splinting or tachypnea; breath sounds clear and equal bilaterally; no wheezes, no rhonchi, no rales, no hypoxia or respiratory distress, speaking full sentences ABD/GI: Normal bowel sounds; non-distended; soft, non-tender, no rebound, no guarding, no peritoneal signs BACK:  The back appears normal and is non-tender to palpation, there is no CVA tenderness EXT: Normal ROM in all joints; non-tender to palpation; no edema; normal capillary refill; no cyanosis, no calf tenderness or swelling    SKIN: Normal color for age and race; warm, no rash NEURO: Moves all extremities equally, sensation to light touch intact diffusely, cranial nerves II through XII intact, normal gait PSYCH: The patient's mood and manner are appropriate. Grooming  and personal hygiene are appropriate.  MEDICAL DECISION MAKING: Patient here with hyperglycemia and hypotension. Afebrile and nontoxic appearing. We'll give IV fluids, obtain labs, EKG, chest x-ray and urine.  ED PROGRESS: Patient has a glucose of 698 but is not in DKA. Bicarbonate is 30, anion gap 7. Troponin negative. Chest x-ray clear. EKG shows no ischemic changes. Urine shows no sign of infection but suspect that her urinary frequency is secondary to increased glucose in her urine. Blood pressure is improving with IV hydration and is now 100/67 after 1 L of IV fluid. Will give second liter of IV fluid, 10 unit IV insulin bolus and start insulin drip. If blood pressure and glucose do not improve quickly, will admit patient.   3:00 AM Pt's glucose is now 235. Will stop her insulin drip. Blood pressure is still slightly low. We'll give third liter of fluid she reports she would like to go home after this. Is still complaining of mild nausea. We'll give second dose of Phenergan.   3:45 AM  Pt reports feeling much better. She is drinking without difficulty. Systolic blood pressure is in the 100s but she has  received 2 doses of Phenergan. Is a bili without difficulty and denies feeling lightheaded. We'll discharge patient home. She has appoint to see her primary care physician this week. Discussed return precautions. She verbalized understanding and is comfortable with plan.   EKG Interpretation  Date/Time:  Saturday Dec 21 2014 00:46:23 EDT Ventricular Rate:  59 PR Interval:  169 QRS Duration: 95 QT Interval:  434 QTC Calculation: 430 R Axis:   81 Text Interpretation:  Sinus rhythm Consider left atrial enlargement No significant change since last tracing Confirmed by Freida Nebel,  DO, Donavan Kerlin ([id number]) on 12/21/2014 1:00:26 AM        CRITICAL CARE Performed by: Nyra Jabs   Total critical care time: 45 minutes  Critical care time was exclusive of separately billable procedures and treating other patients.  Critical care was necessary to treat or prevent imminent or life-threatening deterioration.  Critical care was time spent personally by me on the following activities: development of treatment plan with patient and/or surrogate as well as nursing, discussions with consultants, evaluation of patient's response to treatment, examination of patient, obtaining history from patient or surrogate, ordering and performing treatments and interventions, ordering and review of laboratory studies, ordering and review of radiographic studies, pulse oximetry and re-evaluation of patient's condition.    I personally performed the services described in this documentation, which was scribed in my presence. The recorded information has been reviewed and is accurate.   [place], DO 12/21/14 0340   4:00 AM  Pt's glucose in the 60s prior to discharge. Will give soda, crackers and monitor.  Insulin bolus give 156 AM.  Insulin infusion started at 2am, stopped at 3am.    4:50 AM  Pt's blood glucose has improved to 123. We'll discharge home.  [place], DO 12/21/14 [phone]

## 2014-12-21 NOTE — Discharge Instructions (Signed)
Hyperglycemia °Hyperglycemia occurs when the glucose (sugar) in your blood is too high. Hyperglycemia can happen for many reasons, but it most often happens to people who do not know they have diabetes or are not managing their diabetes properly.  °CAUSES  °Whether you have diabetes or not, there are other causes of hyperglycemia. Hyperglycemia can occur when you have diabetes, but it can also occur in other situations that you might not be as aware of, such as: °Diabetes °· If you have diabetes and are having problems controlling your blood glucose, hyperglycemia could occur because of some of the following reasons: °· Not following your meal plan. °· Not taking your diabetes medications or not taking it properly. °· Exercising less or doing less activity than you normally do. °· Being sick. °Pre-diabetes °· This cannot be ignored. Before people develop Type 2 diabetes, they almost always have "pre-diabetes." This is when your blood glucose levels are higher than normal, but not yet high enough to be diagnosed as diabetes. Research has shown that some long-term damage to the body, especially the heart and circulatory system, may already be occurring during pre-diabetes. If you take action to manage your blood glucose when you have pre-diabetes, you may delay or prevent Type 2 diabetes from developing. °Stress °· If you have diabetes, you may be "diet" controlled or on oral medications or insulin to control your diabetes. However, you may find that your blood glucose is higher than usual in the hospital whether you have diabetes or not. This is often referred to as "stress hyperglycemia." Stress can elevate your blood glucose. This happens because of hormones put out by the body during times of stress. If stress has been the cause of your high blood glucose, it can be followed regularly by your caregiver. That way he/she can make sure your hyperglycemia does not continue to get worse or progress to  diabetes. °Steroids °· Steroids are medications that act on the infection fighting system (immune system) to block inflammation or infection. One side effect can be a rise in blood glucose. Most people can produce enough extra insulin to allow for this rise, but for those who cannot, steroids make blood glucose levels go even higher. It is not unusual for steroid treatments to "uncover" diabetes that is developing. It is not always possible to determine if the hyperglycemia will go away after the steroids are stopped. A special blood test called an A1c is sometimes done to determine if your blood glucose was elevated before the steroids were started. °SYMPTOMS °· Thirsty. °· Frequent urination. °· Dry mouth. °· Blurred vision. °· Tired or fatigue. °· Weakness. °· Sleepy. °· Tingling in feet or leg. °DIAGNOSIS  °Diagnosis is made by monitoring blood glucose in one or all of the following ways: °· A1c test. This is a chemical found in your blood. °· Fingerstick blood glucose monitoring. °· Laboratory results. °TREATMENT  °First, knowing the cause of the hyperglycemia is important before the hyperglycemia can be treated. Treatment may include, but is not be limited to: °· Education. °· Change or adjustment in medications. °· Change or adjustment in meal plan. °· Treatment for an illness, infection, etc. °· More frequent blood glucose monitoring. °· Change in exercise plan. °· Decreasing or stopping steroids. °· Lifestyle changes. °HOME CARE INSTRUCTIONS  °· Test your blood glucose as directed. °· Exercise regularly. Your caregiver will give you instructions about exercise. Pre-diabetes or diabetes which comes on with stress is helped by exercising. °· Eat wholesome,   balanced meals. Eat often and at regular, fixed times. Your caregiver or nutritionist will give you a meal plan to guide your sugar intake.  Being at an ideal weight is important. If needed, losing as little as 10 to 15 pounds may help improve blood  glucose levels. SEEK MEDICAL CARE IF:   You have questions about medicine, activity, or diet.  You continue to have symptoms (problems such as increased thirst, urination, or weight gain). SEEK IMMEDIATE MEDICAL CARE IF:   You are vomiting or have diarrhea.  Your breath smells fruity.  You are breathing faster or slower.  You are very sleepy or incoherent.  You have numbness, tingling, or pain in your feet or hands.  You have chest pain.  Your symptoms get worse even though you have been following your caregiver's orders.  If you have any other questions or concerns. Document Released: 01/12/2001 Document Revised: 10/11/2011 Document Reviewed: 11/15/2011 [facility] Patient Information 2015 [place], [place]. This information is not intended to replace advice given to you by your health care provider. Make sure you discuss any questions you have with your health care provider.   Hypotension As your heart beats, it forces blood through your arteries. This force is your blood pressure. If your blood pressure is too low for you to go about your normal activities or to support the organs of your body, you have hypotension. Hypotension is also referred to as low blood pressure. When your blood pressure becomes too low, you may not get enough blood to your brain. As a result, you may feel weak, feel lightheaded, or develop a rapid heart rate. In a more severe case, you may faint. CAUSES Various conditions can cause hypotension. These include:  Blood loss.  Dehydration.  Heart or endocrine problems.  Pregnancy.  Severe infection.  Not having a well-balanced diet filled with needed nutrients.  Severe allergic reactions (anaphylaxis). Some medicines, such as blood pressure medicine or water pills (diuretics), may lower your blood pressure below normal. Sometimes taking too much medicine or taking medicine not as directed can cause hypotension. TREATMENT  Hospitalization is sometimes  required for hypotension if fluid or blood replacement is needed, if time is needed for medicines to wear off, or if further monitoring is needed. Treatment might include changing your diet, changing your medicines (including medicines aimed at raising your blood pressure), and use of support stockings. HOME CARE INSTRUCTIONS   Drink enough fluids to keep your urine clear or pale yellow.  Take your medicines as directed by your health care provider.  Get up slowly from reclining or sitting positions. This gives your blood pressure a chance to adjust.  Wear support stockings as directed by your health care provider.  Maintain a healthy diet by including nutritious food, such as fruits, vegetables, nuts, whole grains, and lean meats. SEEK MEDICAL CARE IF:  You have vomiting or diarrhea.  You have a fever for more than 2-3 days.  You feel more thirsty than usual.  You feel weak and tired. SEEK IMMEDIATE MEDICAL CARE IF:   You have chest pain or a fast or irregular heartbeat.  You have a loss of feeling in some part of your body, or you lose movement in your arms or legs.  You have trouble speaking.  You become sweaty or feel lightheaded.  You faint. MAKE SURE YOU:   Understand these instructions.  Will watch your condition.  Will get help right away if you are not doing well or get worse.  Document Released: 07/19/2005 Document Revised: 05/09/2013 Document Reviewed: 01/19/2013 [facility] Patient Information 2015 [place], [place]. This information is not intended to replace advice given to you by your health care provider. Make sure you discuss any questions you have with your health care provider.  Dehydration, Adult Dehydration is when you lose more fluids from the body than you take in. Vital organs like the kidneys, brain, and heart cannot function without a proper amount of fluids and salt. Any loss of fluids from the body can cause dehydration.  CAUSES    Vomiting.  Diarrhea.  Excessive sweating.  Excessive urine output.  Fever. SYMPTOMS  Mild dehydration  Thirst.  Dry lips.  Slightly dry mouth. Moderate dehydration  Very dry mouth.  Sunken eyes.  Skin does not bounce back quickly when lightly pinched and released.  Dark urine and decreased urine production.  Decreased tear production.  Headache. Severe dehydration  Very dry mouth.  Extreme thirst.  Rapid, weak pulse (more than 100 beats per minute at rest).  Cold hands and feet.  Not able to sweat in spite of heat and temperature.  Rapid breathing.  Blue lips.  Confusion and lethargy.  Difficulty being awakened.  Minimal urine production.  No tears. DIAGNOSIS  Your caregiver will diagnose dehydration based on your symptoms and your exam. Blood and urine tests will help confirm the diagnosis. The diagnostic evaluation should also identify the cause of dehydration. TREATMENT  Treatment of mild or moderate dehydration can often be done at home by increasing the amount of fluids that you drink. It is best to drink small amounts of fluid more often. Drinking too much at one time can make vomiting worse. Refer to the home care instructions below. Severe dehydration needs to be treated at the hospital where you will probably be given intravenous (IV) fluids that contain water and electrolytes. HOME CARE INSTRUCTIONS   Ask your caregiver about specific rehydration instructions.  Drink enough fluids to keep your urine clear or pale yellow.  Drink small amounts frequently if you have nausea and vomiting.  Eat as you normally do.  Avoid:  Foods or drinks high in sugar.  Carbonated drinks.  Juice.  Extremely hot or cold fluids.  Drinks with caffeine.  Fatty, greasy foods.  Alcohol.  Tobacco.  Overeating.  Gelatin desserts.  Wash your hands well to avoid spreading bacteria and viruses.  Only take over-the-counter or prescription  medicines for pain, discomfort, or fever as directed by your caregiver.  Ask your caregiver if you should continue all prescribed and over-the-counter medicines.  Keep all follow-up appointments with your caregiver. SEEK MEDICAL CARE IF:  You have abdominal pain and it increases or stays in one area (localizes).  You have a rash, stiff neck, or severe headache.  You are irritable, sleepy, or difficult to awaken.  You are weak, dizzy, or extremely thirsty. SEEK IMMEDIATE MEDICAL CARE IF:   You are unable to keep fluids down or you get worse despite treatment.  You have frequent episodes of vomiting or diarrhea.  You have blood or green matter (bile) in your vomit.  You have blood in your stool or your stool looks black and tarry.  You have not urinated in 6 to 8 hours, or you have only urinated a small amount of very dark urine.  You have a fever.  You faint. MAKE SURE YOU:   Understand these instructions.  Will watch your condition.  Will get help right away if you are not doing well  or get worse. Document Released: 07/19/2005 Document Revised: 10/11/2011 Document Reviewed: 03/08/2011 [facility] Patient Information 2015 [place], [place]. This information is not intended to replace advice given to you by your health care provider. Make sure you discuss any questions you have with your health care provider.

## 2014-12-23 ENCOUNTER — Telehealth: Payer: Self-pay | Admitting: Internal Medicine

## 2014-12-23 LAB — CBG MONITORING, ED: Glucose-Capillary: 69 mg/dL (ref 65–99)

## 2014-12-23 NOTE — Telephone Encounter (Signed)
Called pt and lvm advising her that we received the message from [facility] and that Dr Cruzita Lederer saw that she had gone to the ED. Wanted her to check with her and see what happened and to see how she is doing now. Advised pt to call back when she is able to and let us know.

## 2014-12-23 NOTE — Telephone Encounter (Signed)
I read the ED note. Larene Beach, can you please see what happened and check on her?  Thank you, C

## 2014-12-23 NOTE — Telephone Encounter (Signed)
Pt called back. She said that she is unsure what happened. Her blood sugar continued to go up and she continue to give herself a bolus. She went to ED her blood sugar was in the 600's. She feels better but was wiped out the next day. Pt to come in tomorrow to see Vaughan Basta to start her insulin pump. Be advised.

## 2014-12-23 NOTE — Telephone Encounter (Signed)
[facility] note dated 12/20/14 11:13 PM:  BLOOD SUGAR HIGH - 500 or greater Initial Comment Caller states blood sugar is not controllable today. Current 534. Feeling very drained and tired. [place] Not [facility] PreDisposition Did not know what to do  Caller states that she has been unable to keep her blood sugar in check. Currently it is 534. Caller reports feels very tired. Extra units of Humalog have been given. This morning she gave 5 units and then an additional 3 for carbs. Lunch 130- no insulin for sugar, 3 units for carb Dinner 508 10 units for sugar. . two hours later it was 388 and 8 units was given. 2 hours after that it was 488 and 8 units was given Care Advice Given Per Guideline GO TO ED NOW: You need to be seen in the Emergency Department. Go to the ER at ___________ [place] now. Drive carefully. NOTE TO TRIAGER - DRIVING: * Another adult should drive. BRING MEDICINES: * Please bring a list of your current medicines when you go to the Emergency Department (ER). CARE ADVICE given per Diabetes - High Blood Sugar (Adult) guideline.

## 2014-12-23 NOTE — Telephone Encounter (Signed)
Please read message below and advise.  

## 2014-12-24 ENCOUNTER — Encounter: Payer: Medicare Other | Attending: Internal Medicine | Admitting: Nutrition

## 2014-12-24 DIAGNOSIS — E1042 Type 1 diabetes mellitus with diabetic polyneuropathy: Secondary | ICD-10-CM

## 2014-12-24 DIAGNOSIS — E1065 Type 1 diabetes mellitus with hyperglycemia: Secondary | ICD-10-CM

## 2014-12-24 DIAGNOSIS — E1040 Type 1 diabetes mellitus with diabetic neuropathy, unspecified: Secondary | ICD-10-CM | POA: Insufficient documentation

## 2014-12-24 DIAGNOSIS — Z794 Long term (current) use of insulin: Secondary | ICD-10-CM | POA: Insufficient documentation

## 2014-12-24 DIAGNOSIS — Z713 Dietary counseling and surveillance: Secondary | ICD-10-CM | POA: Insufficient documentation

## 2014-12-24 NOTE — Progress Notes (Addendum)
[name] was instructed on the use of the OmniPod insulin delivery system.   She did not do any prepump training.  She put in settings10: to PMD, with little assistance from me.  She was encouraged to read over the manual today and tonight and will will discuss any questions tomorrow. Settings:  Basal rate: 0.9u/hr.  I/C ratio: 6 sensitivity: 50, timing 4 hours, target: 100 with a correction over 120.   She was shown how to give a bolus and she re demonstrated this X2 without difficulty.   She filled a pod with Humalog insulin and it was placed and started in her upper left abdomen with no discomfort.  She did not given her Lantus this AM.    We reviewed the need to test blood sugars ac, 2hr. Pc, HS and 3 AM.  She was given a sheet to record these readings.  Her blood sugar was 330 at 10:30 AM and she did a correction of 5.2u at that time with no difficulty.  We reviewed treatments for low blood sugars.  She is using glucose tablets.  She was told that each tablet with raise her blood sugar approx. 15 points.  She reported good understanding of this.  We also reviewed the idea of IOB, and the need to give 6 grams of carbs for every unit of IOB when she some some remaining insulin.  She reported good understanding of this.    She was shown how and when to use the temp. Basal rate, and she re demonstrated how to do this correctly.    She had no final questions.  I will call her tonight to see how she is doing after supper.  She was told to call the office if blood sugars remain high, or drop low today.  She agreed to do this.

## 2014-12-25 ENCOUNTER — Encounter: Payer: Medicare Other | Admitting: Nutrition

## 2014-12-25 ENCOUNTER — Other Ambulatory Visit: Payer: Self-pay | Admitting: *Deleted

## 2014-12-25 DIAGNOSIS — E1042 Type 1 diabetes mellitus with diabetic polyneuropathy: Secondary | ICD-10-CM

## 2014-12-25 DIAGNOSIS — E1065 Type 1 diabetes mellitus with hyperglycemia: Secondary | ICD-10-CM

## 2014-12-25 MED ORDER — GLUCOSE BLOOD VI STRP: ORAL_STRIP | Status: DC

## 2014-12-25 MED ORDER — INSULIN GLARGINE 100 UNIT/ML ~~LOC~~ SOLN: SUBCUTANEOUS | Status: DC

## 2014-12-25 NOTE — Progress Notes (Signed)
I left her a message on her cell phone last night to call me to see how she was doing.  She did not return my call.  She said that she fell asleep.   She reported no difficulty giving boluses, or using the PDM.  Blood sugars were very good at [facility] and all afternoon and evening.  Blood sugar at HS was 112 with no IOB, and dropped to 51 at 3AM.  She took 10 glucose tablets and blood sugar was 361 at 7AM With a correction bolus only, blood sugar dropped to 34 2hr. Later.   Basal change:  MN: 0.75, 7AM: 0.90 Sensitivity changed from 50 to 70.   We reviewed temp. Basal rates, sick days, high blood sugar protocols and extended boluses--when and how to do this.   She re demonstrated this and had no final questions.   She signed off as understanding all topics and had no final questions.   She will call me blood sugar readings tomorrow after lunch.  Reviewed the need to only take 4 glucose tablets when blood sugar is low, and retest in 15 min.  She agreed to do this.

## 2014-12-25 NOTE — Patient Instructions (Addendum)
Test blood sugars before meals, 2hr. After the meal, at bedtime, and 3AM Read over instruction manual Call the office today if blood sugars remain high, or drop low.

## 2014-12-25 NOTE — Patient Instructions (Addendum)
Continue to test blood sugars before meals, 2hours after meals, bedtime and 3AM When blood sugar is low, take 3-4 glucose tablets and retest blood sugar in 15 min.

## 2015-01-01 ENCOUNTER — Telehealth: Payer: Self-pay | Admitting: Nutrition

## 2015-01-01 NOTE — Telephone Encounter (Signed)
Patient phoned blood sugar readings from pump start last week. She admits that she is overtreating her low blood sugars with a regular size candy bar, or cookies.  Suggested only 15 grams of carb, and gave her suggestions for this. Date   acB              2hr. PcB,                acL               2hr. pcL             acS               2hr. pcS                  HS 5/28    64 (9AM)        342*(11AM)         110 (2PM)     41(4PM)           258(7PM)         337*(9PM)           377* (11PM)            *correction bolus given per pump                                                               No lunch 5/29    138(6AM)       125(8AM)             104(1PM)    401* (2PM)*       557(5PM)*       222**                      61                            *14u via syringe, changed pod                                                                                                                                                                                                     **gave correction bolus, forgot aboutIOB from syringe 5/30    177  352                       51                368                    154                  151                       320*                          *correction bolus per pump 5/31    269                 180                      202               150                     80                   163                       101 (nothing to eat or drink since supper, and no IOB) 6/1      223  I told her that I could not find a pattern, and that I did not want to make any changes to her settings.  Will see her in 1 week to download her pump.  Call if blood sugars drop low.

## 2015-01-02 ENCOUNTER — Telehealth: Payer: Self-pay | Admitting: *Deleted

## 2015-01-02 MED ORDER — INSULIN LISPRO 100 UNIT/ML ~~LOC~~ SOLN: SUBCUTANEOUS | Status: DC

## 2015-01-02 NOTE — Telephone Encounter (Signed)
Pt called and needs insulin for her pump. Please advise how much to send in for her. Thank you.

## 2015-01-02 NOTE — Telephone Encounter (Signed)
Spoke with pt. She is unsure about how much insulin she is using daily, due to the fact she is a new pump start. Sent in 20 mL's until she can see Vaughan Basta next week.

## 2015-01-02 NOTE — Telephone Encounter (Signed)
Please call her and ask her how much she needs.

## 2015-01-03 ENCOUNTER — Other Ambulatory Visit: Payer: Self-pay | Admitting: *Deleted

## 2015-01-08 ENCOUNTER — Encounter: Payer: Medicare Other | Attending: Internal Medicine | Admitting: Nutrition

## 2015-01-08 DIAGNOSIS — E1040 Type 1 diabetes mellitus with diabetic neuropathy, unspecified: Secondary | ICD-10-CM | POA: Insufficient documentation

## 2015-01-08 DIAGNOSIS — E1042 Type 1 diabetes mellitus with diabetic polyneuropathy: Secondary | ICD-10-CM

## 2015-01-08 DIAGNOSIS — E1065 Type 1 diabetes mellitus with hyperglycemia: Secondary | ICD-10-CM

## 2015-01-08 DIAGNOSIS — Z713 Dietary counseling and surveillance: Secondary | ICD-10-CM | POA: Insufficient documentation

## 2015-01-08 DIAGNOSIS — Z794 Long term (current) use of insulin: Secondary | ICD-10-CM | POA: Insufficient documentation

## 2015-01-08 NOTE — Patient Instructions (Signed)
Bolus for sugar free candies

## 2015-01-08 NOTE — Progress Notes (Signed)
Blood sugars variableFBSs: 104-266, acL: 150-370,  AcS: 120-425, HS: 82-320.Marland Kitchen Twice she did not count her carbs correctly.  I suggested she see the dietitian for a review of this.  I made some changes to basal rates slightly increasing from12Noon-4PM, and again from 6PM-MN.  While walking out, she mentioned that she was eating a lot of "sugar free" candy, to help her stop smoking.  I explained that she need to bolus for them. I gave her a copy of her old basal rates, and told her that if she starts dropping low, to reduce her basal rates to the old ones.  She agreed to do this.

## 2015-02-13 ENCOUNTER — Ambulatory Visit (INDEPENDENT_AMBULATORY_CARE_PROVIDER_SITE_OTHER): Payer: Medicare Other | Admitting: Internal Medicine

## 2015-02-13 ENCOUNTER — Encounter: Payer: Self-pay | Admitting: Internal Medicine

## 2015-02-13 VITALS — BP 104/60 | HR 74 | Temp 97.7°F | Resp 12 | Wt 148.4 lb

## 2015-02-13 DIAGNOSIS — E1065 Type 1 diabetes mellitus with hyperglycemia: Secondary | ICD-10-CM

## 2015-02-13 DIAGNOSIS — E1042 Type 1 diabetes mellitus with diabetic polyneuropathy: Secondary | ICD-10-CM

## 2015-02-13 DIAGNOSIS — E1040 Type 1 diabetes mellitus with diabetic neuropathy, unspecified: Secondary | ICD-10-CM | POA: Diagnosis not present

## 2015-02-13 NOTE — Patient Instructions (Signed)
Please change the settings as follows: - basal rates: 12 am: 0.85 units/h 7:30: 0.75 12 pm: 0.85 4 pm: 0.75 6  pm: 0.85 - ICR:   12 am: 8   1 pm: 6 >> 7 - target: 100-120 >> 110-120 - ISF: 70 - Insulin on Board: 4h - bolus wizard: on  Start bolusing 15 min before EVERY meal or snack.  Continue to see [name].  Please return in 1.5 month with your sugar log.

## 2015-02-13 NOTE — Progress Notes (Signed)
Patient ID: [name], female   DOB: [birth year], 44 y.o.   MRN: [id number]  HPI: [name] is a 44 y.o.-year-old female, returning for f/u for DM1, dx in 17 (44 y/o), uncontrolled, with complications (cerebro-vascular ds - h/o TIA 0/2016, PN). Last visit 2 mo ago.  Last hemoglobin A1c was: Lab Results  Component Value Date   HGBA1C 9.3* 12/06/2014   HGBA1C 11.5* 08/17/2014   HGBA1C 11.1* 06/14/2014   She has been on an insulin pump before >> sugars higher (2000). She is interested to get back on this. Omnipod - got it 12/25/2014. She has pbs with [employer]: supplies.   Pt was on a regimen of: - Lantus to 26 units in am - Humalog dose as follows: - 10 units before a smaller meal - 12 units before a regular meal - 14 units before a larger meal - SSI Humalog:  150-200: + 1 unit 201-250: + 2 units 251-300: + 3 units 301-350: + 4 units >350: + 5 units  Now on the insulin pump: - Pump settings:  - basal rates: 12 am: 0.85 units/h 7:30: 0.75 12 pm: 0.85 4 pm: 0.75 6  Pm: 0.85 TDD from basal insulin: 19.7 units - ICR:   12 am: 8  1 pm: 6 - target: 100-120 - ISF: 70 - Insulin on Board: 4h - bolus wizard: on TDD from bolus insulin: 7-16 units - extended bolusing: using - changes infusion site: q3 days - Meter: Omnipod meter  Pt checks her sugars 4 a day and they are - fluctuating:  - am: 126-323 >> 117, 195-300 >> 52-495 - 2h after b'fast: 48, 65, 189-332 >> n/c >> 64-150, 218 - before lunch: 122, 191-266 >> 49-144 >> 153-265 - 2h after lunch: 271, 284 >> n/c >> n/c - before dinner: 305, 341 >> 44, 71-120, 193, 252 >> 125-341 - 2h after dinner: n/c >> 48-468 - bedtime: n/c >> 52, 84, 114-150, 200 x2 >> 109-445 - nighttime: 67, 75, 307 >> n/c No lows. Lowest sugar was 27 in 07/2014,  Recently 48; she has no hypoglycemia awareness! Highest sugar was 514 >> 300s >> 400s  No h/o DKA or hypoglycemia admissions. Had a hyperglycemia ED visit  12/21/2014.  Pt's meals are: - Breakfast: toast + PB and bacon - Lunch: chicken salad + fruit - may skip - Dinner: veggies + meat - Snacks: 0 Drinks diet SunDrop sodas.   For exercise, walks every other day.  - no CKD, last BUN/creatinine:  Lab Results  Component Value Date   BUN 13 12/21/2014   CREATININE 0.82 12/21/2014   - last set of lipids: Lab Results  Component Value Date   CHOL 162 08/17/2014   HDL 54 08/17/2014   LDLCALC 88 08/17/2014   TRIG 100 08/17/2014   CHOLHDL 3.0 08/17/2014  On Zocor. - last eye exam was in 09/16/2014. No DR.  - + numbness and tingling in her feet. Stopped Gabapentin b/c SEs >> restarted now at lower dose.  She also has a h/o HL. No h/o hypothyroidism.  ROS: Constitutional: + weight gain, no fatigue, no subjective hyperthermia/hypothermia Eyes: no blurry vision, no xerophthalmia ENT: no sore throat, no nodules palpated in throat, no dysphagia/odynophagia, no hoarseness Cardiovascular: no CP/SOB/palpitations/leg swelling Respiratory: no cough/SOB Gastrointestinal: no N/V/D/C Musculoskeletal: no muscle/joint aches Skin: no rashes Neurological: no tremors/+ numbness/+ tingling/no dizziness  I reviewed pt's medications, allergies, PMH, social hx, family hx, and changes were documented in the history of present  illness. Otherwise, unchanged from my initial visit note:  Past Medical History  Diagnosis Date  . Diabetes mellitus without complication   . Neuropathy   . Depression   . GERD (gastroesophageal reflux disease)   . Hiatal hernia   . Migraine headache   . IBS (irritable bowel syndrome)   . Vertigo   . Hypertension   . Hyperlipidemia   . Anxiety   . Abdominal wall mass of right flank 04/11/13  . Abrasion of leg with infection 02/18/14  . Benign paroxysmal vertigo 09/04/13  . Bronchitis, acute 02/28/14  . Bunion of great toe 04/11/13  . Cervicalgia 06/13/14  . Dorsalgia 05/17/14  . Dysuria 03/27/14  . Gastroenteritis 01/28/14   . Gastro-esophageal reflux disease without esophagitis 01/28/14  . Pain in thoracic spine 06/13/14  . 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 Peripheral neuropathy 11/05/13  . Subungual hematoma of digit of hand   . Tendonitis 03/07/13  . Tobacco use    Past Surgical History  Procedure Laterality Date  . Cesarean section    . Tubal ligation    . Abdominal hysterectomy    . Appendectomy    . Cholecystectomy    . Cyst excision      right breast  . Cardiac catheterization  2006    normal coronary arteries   History   Social History  . Marital Status: Divorced    Spouse Name: N/A  . Number of Children: 1   Occupational History  . disabled   Social History Main Topics  . Smoking status: Current Every Day Smoker -- 0.50 packs/day for 11 years  . Smokeless tobacco: Never Used     Comment: patient is aware that she needs to quit smoking  . Alcohol Use: No  . Drug Use: No   Current Outpatient Prescriptions on File Prior to Visit  Medication Sig Dispense Refill  . Albuterol Sulfate 108 (90 BASE) MCG/ACT AEPB Inhale 1 puff into the lungs every 4 (four) hours as needed.    . Alcohol Swabs (ALCOHOL PREP PAD) 70 % PADS   2  . aspirin EC 81 MG tablet Take 1 tablet (81 mg total) by mouth daily.    . clonazePAM (KLONOPIN) 1 MG tablet Take 1 mg by mouth at bedtime.    . docusate sodium (COLACE) 100 MG capsule Take 100 mg by mouth 2 (two) times daily.    Marland Kitchen escitalopram (LEXAPRO) 20 MG tablet Take 20 mg by mouth daily.    Marland Kitchen estradiol (ESTRACE) 0.5 MG tablet Take 0.5 mg by mouth every morning.    Marland Kitchen FIBER PO Take 1 capsule by mouth daily.    Marland Kitchen gabapentin (NEURONTIN) 400 MG capsule Take 300 mg by mouth daily.     Marland Kitchen glucagon (GLUCAGON EMERGENCY) 1 MG injection Inject 1 mg into the muscle once as needed. 1 each 1  . glucose blood (FREESTYLE TEST STRIPS) test strip Use to test blood sugar 4-6 times daily as instructed. Dx: E10.40 200 each 11  . glucose blood (ONETOUCH VERIO) test strip Use to  test blood sugar 4-6 times daily as instructed. Dx: E10.40 550 each 3  . HYDROcodone-acetaminophen (NORCO) 10-325 MG per tablet Take 1 tablet by mouth every 6 (six) hours as needed for moderate pain.    Marland Kitchen Hydrocodone-Acetaminophen 5-300 MG TABS   0  . insulin glargine (LANTUS) 100 UNIT/ML injection Use 60 units in insulin pump daily. Dx: E10.40 20 mL 2  . insulin lispro (HUMALOG) 100 UNIT/ML injection  Use approx 80 units daily in insulin pump. 20 mL 1  . lisinopril (PRINIVIL,ZESTRIL) 5 MG tablet Take 5 mg by mouth daily.    Marland Kitchen LORazepam (ATIVAN) 1 MG tablet Take 1 mg by mouth every 8 (eight) hours as needed for anxiety.    . meclizine (ANTIVERT) 25 MG tablet Take 25 mg by mouth every 6 (six) hours as needed.   0  . Multiple Vitamin (MULTIVITAMIN WITH MINERALS) TABS Take 1 tablet by mouth every morning.    . Omega-3 Fatty Acids (FISH OIL) 1000 MG CAPS Take 1 capsule by mouth daily.    Glory Rosebush DELICA LANCETS FINE MISC Use to test blood sugar 4-6 times daily as instructed. Dx: E10.40 600 each 3  . promethazine (PHENERGAN) 25 MG tablet Take 1 tablet (25 mg total) by mouth every 6 (six) hours as needed for nausea or vomiting. 15 tablet 0  . simvastatin (ZOCOR) 40 MG tablet Take 40 mg by mouth daily.     No current facility-administered medications on file prior to visit.   Allergies  Allergen Reactions  . Clindamycin/Lincomycin Rash  . Codeine Rash  . Penicillins Rash  . Sulfa Antibiotics Rash        Family History  Problem Relation Age of Onset  . Stroke Mother 63  . Heart attack Mother 27  . Cancer Sister     colon  . Heart failure Maternal Grandmother 65  . Cancer Maternal Grandfather     prostate  . Heart failure Paternal Grandmother 63  . Heart failure Paternal Grandfather 60   PE: BP 104/60 mmHg  Pulse 74  Temp(Src) 97.7 F (36.5 C) (Oral)  Resp 12  Wt 148 lb 6.4 oz (67.314 kg)  SpO2 97% Body mass index is 23.24 kg/(m^2). Wt Readings from Last 3 Encounters:  02/13/15  148 lb 6.4 oz (67.314 kg)  12/21/14 140 lb (63.504 kg)  12/06/14 146 lb 6.4 oz (66.407 kg)   Constitutional: normal weight, in NAD Eyes: PERRLA, EOMI, no exophthalmos ENT: moist mucous membranes, no thyromegaly, no cervical lymphadenopathy Cardiovascular: RRR, No MRG Respiratory: CTA B Gastrointestinal: abdomen soft, NT, ND, BS+ Musculoskeletal: no deformities, strength intact in all 4 Skin: moist, warm, no rashes Neurological: no tremor with outstretched hands, DTR normal in all 4  ASSESSMENT: 1. DM1, insulin-dependent, uncontrolled, with complications - cerebro-vascular ds - h/o TIA 0/2016 - PN  PLAN:  1. Patient with long-standing, uncontrolled DM1, on Omnipod insulin pump now, with sugars in the 400s when she does not bolus and also some low blood sugars in the 50's usually after bolusing. CBGs in am are close to target, except when she has a high or low blood sugars at night. - We will, therefore, relax her insulin to carb ratio and increase her lower end of her target, to avoid low blood sugars after meals - I strongly advised her not to miss insulin doses as this will give her sugars in the 400s, which are very difficult to improve afterwards. She tells me that she doesn't bolus because she does not eat. It is difficult to believe that she only uses 16-23 g of carbs as documented in some of her day transcripts from the pump... I also advised her to bolus for snacks - We also discussed about not over correcting lows - I suggested to:  Patient Instructions   Patient Instructions  Please change the settings as follows: - basal rates: 12 am: 0.85 units/h 7:30: 0.75 12 pm: 0.85 4 pm:  0.75 6  pm: 0.85 - ICR:   12 am: 8   1 pm: 6 >> 7 - target: 100-120 >> 110-120 - ISF: 70 - Insulin on Board: 4h - bolus wizard: on  Start bolusing 15 min before EVERY meal or snack.  Continue to see Vaughan Basta.  Please return in 1.5 month with your sugar log.   - continue checking sugars at  different times of the day - check 4 times a day, rotating checks - advised for yearly eye exams >> she is up-to-date  - no signs of other autoimmune disorders - reviewed b12, TSH, vit D >> normal - check HbA1c at next visit - Return to clinic in 1.5 mo with sugar log   - time spent with the patient: 40 min, of which >50% was spent in reviewing her pump downloads, discussing her hypo- and hyper-glycemic episodes, reviewing previous labs and pump settings and developing a plan to avoid hypo- and hyper-glycemia.

## 2015-02-25 ENCOUNTER — Telehealth: Payer: Self-pay | Admitting: Nutrition

## 2015-02-25 NOTE — Telephone Encounter (Signed)
Phone Call to patient to see if she was able to make the changes to her insulin pump that Dr. Renne Crigler ordered.  She said that she was able to do this, but was having trouble with her insurance company and [place] to get her insulin and Omnipods.   She was given Elizabeth's number to call to help her with this.  She is the OmniPod rep., and will be able to check on her insurance coverage for her.

## 2015-02-26 ENCOUNTER — Ambulatory Visit: Payer: Medicare Other | Admitting: Nutrition

## 2015-03-25 ENCOUNTER — Emergency Department (HOSPITAL_COMMUNITY): Payer: Medicare Other

## 2015-03-25 ENCOUNTER — Encounter (HOSPITAL_COMMUNITY): Payer: Self-pay | Admitting: Emergency Medicine

## 2015-03-25 ENCOUNTER — Inpatient Hospital Stay (HOSPITAL_COMMUNITY)
Admission: EM | Admit: 2015-03-25 | Discharge: 2015-03-26 | DRG: 287 | Disposition: A | Discharge status: Home / Self Care | Payer: Medicare Other | Attending: Internal Medicine | Admitting: Internal Medicine

## 2015-03-25 DIAGNOSIS — Z885 Allergy status to narcotic agent status: Secondary | ICD-10-CM

## 2015-03-25 DIAGNOSIS — E1065 Type 1 diabetes mellitus with hyperglycemia: Secondary | ICD-10-CM | POA: Diagnosis present

## 2015-03-25 DIAGNOSIS — Z79899 Other long term (current) drug therapy: Secondary | ICD-10-CM

## 2015-03-25 DIAGNOSIS — Z8673 Personal history of transient ischemic attack (TIA), and cerebral infarction without residual deficits: Secondary | ICD-10-CM

## 2015-03-25 DIAGNOSIS — G459 Transient cerebral ischemic attack, unspecified: Secondary | ICD-10-CM | POA: Diagnosis present

## 2015-03-25 DIAGNOSIS — Z9861 Coronary angioplasty status: Secondary | ICD-10-CM

## 2015-03-25 DIAGNOSIS — K219 Gastro-esophageal reflux disease without esophagitis: Secondary | ICD-10-CM | POA: Diagnosis present

## 2015-03-25 DIAGNOSIS — E1040 Type 1 diabetes mellitus with diabetic neuropathy, unspecified: Secondary | ICD-10-CM

## 2015-03-25 DIAGNOSIS — Z882 Allergy status to sulfonamides status: Secondary | ICD-10-CM | POA: Diagnosis not present

## 2015-03-25 DIAGNOSIS — Z79891 Long term (current) use of opiate analgesic: Secondary | ICD-10-CM | POA: Diagnosis not present

## 2015-03-25 DIAGNOSIS — K589 Irritable bowel syndrome without diarrhea: Secondary | ICD-10-CM | POA: Diagnosis present

## 2015-03-25 DIAGNOSIS — F419 Anxiety disorder, unspecified: Secondary | ICD-10-CM | POA: Diagnosis present

## 2015-03-25 DIAGNOSIS — T463X5A Adverse effect of coronary vasodilators, initial encounter: Secondary | ICD-10-CM | POA: Diagnosis present

## 2015-03-25 DIAGNOSIS — Z9641 Presence of insulin pump (external) (internal): Secondary | ICD-10-CM | POA: Diagnosis present

## 2015-03-25 DIAGNOSIS — R079 Chest pain, unspecified: Secondary | ICD-10-CM | POA: Diagnosis present

## 2015-03-25 DIAGNOSIS — E10649 Type 1 diabetes mellitus with hypoglycemia without coma: Secondary | ICD-10-CM | POA: Diagnosis not present

## 2015-03-25 DIAGNOSIS — E785 Hyperlipidemia, unspecified: Secondary | ICD-10-CM | POA: Diagnosis present

## 2015-03-25 DIAGNOSIS — G629 Polyneuropathy, unspecified: Secondary | ICD-10-CM | POA: Diagnosis present

## 2015-03-25 DIAGNOSIS — R0789 Other chest pain: Secondary | ICD-10-CM

## 2015-03-25 DIAGNOSIS — Z881 Allergy status to other antibiotic agents status: Secondary | ICD-10-CM

## 2015-03-25 DIAGNOSIS — Z8249 Family history of ischemic heart disease and other diseases of the circulatory system: Secondary | ICD-10-CM | POA: Diagnosis not present

## 2015-03-25 DIAGNOSIS — Z86711 Personal history of pulmonary embolism: Secondary | ICD-10-CM | POA: Diagnosis not present

## 2015-03-25 DIAGNOSIS — I952 Hypotension due to drugs: Secondary | ICD-10-CM | POA: Diagnosis present

## 2015-03-25 DIAGNOSIS — F329 Major depressive disorder, single episode, unspecified: Secondary | ICD-10-CM | POA: Diagnosis present

## 2015-03-25 DIAGNOSIS — F17210 Nicotine dependence, cigarettes, uncomplicated: Secondary | ICD-10-CM | POA: Diagnosis present

## 2015-03-25 DIAGNOSIS — E1042 Type 1 diabetes mellitus with diabetic polyneuropathy: Secondary | ICD-10-CM | POA: Diagnosis present

## 2015-03-25 DIAGNOSIS — Z7982 Long term (current) use of aspirin: Secondary | ICD-10-CM

## 2015-03-25 DIAGNOSIS — G43909 Migraine, unspecified, not intractable, without status migrainosus: Secondary | ICD-10-CM | POA: Insufficient documentation

## 2015-03-25 DIAGNOSIS — I10 Essential (primary) hypertension: Secondary | ICD-10-CM | POA: Diagnosis present

## 2015-03-25 DIAGNOSIS — Z79890 Hormone replacement therapy (postmenopausal): Secondary | ICD-10-CM | POA: Diagnosis not present

## 2015-03-25 DIAGNOSIS — I200 Unstable angina: Secondary | ICD-10-CM | POA: Diagnosis not present

## 2015-03-25 DIAGNOSIS — Z823 Family history of stroke: Secondary | ICD-10-CM

## 2015-03-25 DIAGNOSIS — Z880 Allergy status to penicillin: Secondary | ICD-10-CM

## 2015-03-25 DIAGNOSIS — I209 Angina pectoris, unspecified: Secondary | ICD-10-CM | POA: Diagnosis not present

## 2015-03-25 DIAGNOSIS — Z720 Tobacco use: Secondary | ICD-10-CM | POA: Diagnosis present

## 2015-03-25 LAB — BASIC METABOLIC PANEL
ANION GAP: 6 (ref 5–15)
BUN: 11 mg/dL (ref 6–20)
CALCIUM: 9.2 mg/dL (ref 8.9–10.3)
CHLORIDE: 100 mmol/L — AB (ref 101–111)
CO2: 28 mmol/L (ref 22–32)
Creatinine, Ser: 0.78 mg/dL (ref 0.44–1.00)
GFR calc non Af Amer: >60 mL/min (ref >60)
Glucose, Bld: 442 mg/dL — ABNORMAL HIGH (ref 65–99)
POTASSIUM: 4.5 mmol/L (ref 3.5–5.1)
Sodium: 134 mmol/L — ABNORMAL LOW (ref 135–145)

## 2015-03-25 LAB — CBC
HEMATOCRIT: 39.6 % (ref 36.0–46.0)
HEMOGLOBIN: 13.8 g/dL (ref 12.0–15.0)
MCH: 31.1 pg (ref 26.0–34.0)
MCHC: 34.8 g/dL (ref 30.0–36.0)
MCV: 89.2 fL (ref 78.0–100.0)
Platelets: 216 10*3/uL (ref 150–400)
RBC: 4.44 MIL/uL (ref 3.87–5.11)
RDW: 12.5 % (ref 11.5–15.5)
WBC: 7 10*3/uL (ref 4.0–10.5)

## 2015-03-25 LAB — PROTIME-INR
INR: 0.93 (ref 0.00–1.49)
PROTHROMBIN TIME: 12.7 s (ref 11.6–15.2)

## 2015-03-25 LAB — I-STAT TROPONIN, ED: Troponin i, poc: 0 ng/mL (ref 0.00–0.08)

## 2015-03-25 LAB — TROPONIN I: Troponin I: <0.03 ng/mL (ref <0.031)

## 2015-03-25 LAB — GLUCOSE, CAPILLARY: GLUCOSE-CAPILLARY: 275 mg/dL — AB (ref 65–99)

## 2015-03-25 LAB — D-DIMER, QUANTITATIVE (NOT AT ARMC)

## 2015-03-25 LAB — APTT: aPTT: 31 seconds (ref 24–37)

## 2015-03-25 MED ORDER — ONDANSETRON HCL 4 MG/2ML IJ SOLN
4.0000 mg | Freq: Once | INTRAMUSCULAR | Status: AC
  Administered 2015-03-25: 4 mg via INTRAVENOUS
  Filled 2015-03-25: qty 2

## 2015-03-25 MED ORDER — SODIUM CHLORIDE 0.9 % IJ SOLN
3.0000 mL | Freq: Two times a day (BID) | INTRAMUSCULAR | Status: DC
  Administered 2015-03-25: 3 mL via INTRAVENOUS

## 2015-03-25 MED ORDER — SODIUM CHLORIDE 0.9 % IV SOLN
INTRAVENOUS | Status: DC
  Administered 2015-03-25 – 2015-03-26 (×2): via INTRAVENOUS

## 2015-03-25 MED ORDER — ONDANSETRON HCL 4 MG/2ML IJ SOLN: 4.0000 mg | Freq: Three times a day (TID) | INTRAMUSCULAR | Status: DC | PRN

## 2015-03-25 MED ORDER — PRAVASTATIN SODIUM 20 MG PO TABS
10.0000 mg | ORAL_TABLET | Freq: Every day | ORAL | Status: DC
Start: 2015-03-26 — End: 2015-03-26
  Filled 2015-03-25: qty 1

## 2015-03-25 MED ORDER — NICOTINE 21 MG/24HR TD PT24
21.0000 mg | MEDICATED_PATCH | Freq: Every day | TRANSDERMAL | Status: DC
  Administered 2015-03-25 – 2015-03-26 (×2): 21 mg via TRANSDERMAL
  Filled 2015-03-25 (×2): qty 1

## 2015-03-25 MED ORDER — GLUCAGON (RDNA) 1 MG IJ KIT: 1.0000 mg | PACK | Freq: Once | INTRAMUSCULAR | Status: DC | PRN

## 2015-03-25 MED ORDER — ALUM & MAG HYDROXIDE-SIMETH 200-200-20 MG/5ML PO SUSP: 30.0000 mL | Freq: Four times a day (QID) | ORAL | Status: DC | PRN

## 2015-03-25 MED ORDER — ESCITALOPRAM OXALATE 10 MG PO TABS
20.0000 mg | ORAL_TABLET | Freq: Every day | ORAL | Status: DC
  Administered 2015-03-25: 20 mg via ORAL
  Filled 2015-03-25: qty 2

## 2015-03-25 MED ORDER — FENTANYL CITRATE (PF) 100 MCG/2ML IJ SOLN: 50.0000 ug | INTRAMUSCULAR | Status: DC | PRN

## 2015-03-25 MED ORDER — INSULIN LISPRO 100 UNIT/ML ~~LOC~~ SOLN
70.0000 [IU] | SUBCUTANEOUS | Status: DC
  Administered 2015-03-25: 2.35 [IU] via SUBCUTANEOUS

## 2015-03-25 MED ORDER — MORPHINE SULFATE (PF) 2 MG/ML IV SOLN
1.0000 mg | Freq: Once | INTRAVENOUS | Status: AC
  Administered 2015-03-25: 1 mg via INTRAVENOUS
  Filled 2015-03-25: qty 1

## 2015-03-25 MED ORDER — GABAPENTIN 300 MG PO CAPS
600.0000 mg | ORAL_CAPSULE | Freq: Every day | ORAL | Status: DC
  Administered 2015-03-25 – 2015-03-26 (×2): 600 mg via ORAL
  Filled 2015-03-25 (×2): qty 2

## 2015-03-25 MED ORDER — NITROGLYCERIN 0.4 MG SL SUBL: 0.4000 mg | SUBLINGUAL_TABLET | SUBLINGUAL | Status: DC | PRN

## 2015-03-25 MED ORDER — HEPARIN SODIUM (PORCINE) 5000 UNIT/ML IJ SOLN
5000.0000 [IU] | Freq: Three times a day (TID) | INTRAMUSCULAR | Status: DC
  Administered 2015-03-25 – 2015-03-26 (×2): 5000 [IU] via SUBCUTANEOUS
  Filled 2015-03-25 (×2): qty 1

## 2015-03-25 MED ORDER — ASPIRIN EC 325 MG PO TBEC
325.0000 mg | DELAYED_RELEASE_TABLET | Freq: Every day | ORAL | Status: DC
  Administered 2015-03-26: 325 mg via ORAL
  Filled 2015-03-25: qty 1

## 2015-03-25 MED ORDER — LISINOPRIL 2.5 MG PO TABS: 2.5000 mg | ORAL_TABLET | Freq: Every day | ORAL | Status: DC

## 2015-03-25 MED ORDER — MORPHINE SULFATE (PF) 2 MG/ML IV SOLN: 2.0000 mg | INTRAVENOUS | Status: DC | PRN

## 2015-03-25 MED ORDER — ADULT MULTIVITAMIN W/MINERALS CH
1.0000 | ORAL_TABLET | Freq: Every morning | ORAL | Status: DC
  Administered 2015-03-26: 1 via ORAL
  Filled 2015-03-25: qty 1

## 2015-03-25 MED ORDER — ACETAMINOPHEN 650 MG RE SUPP: 650.0000 mg | Freq: Four times a day (QID) | RECTAL | Status: DC | PRN

## 2015-03-25 MED ORDER — SODIUM CHLORIDE 0.9 % IV BOLUS (SEPSIS)
1000.0000 mL | Freq: Once | INTRAVENOUS | Status: AC
  Administered 2015-03-25: 1000 mL via INTRAVENOUS

## 2015-03-25 MED ORDER — ASPIRIN EC 325 MG PO TBEC: 325.0000 mg | DELAYED_RELEASE_TABLET | Freq: Every day | ORAL | Status: DC

## 2015-03-25 MED ORDER — INSULIN PUMP
SUBCUTANEOUS | Status: DC
  Administered 2015-03-26: 1.45 via SUBCUTANEOUS
  Administered 2015-03-26: 04:00:00 via SUBCUTANEOUS
  Filled 2015-03-25: qty 1

## 2015-03-25 MED ORDER — ACETAMINOPHEN 325 MG PO TABS
650.0000 mg | ORAL_TABLET | Freq: Four times a day (QID) | ORAL | Status: DC | PRN
  Filled 2015-03-25: qty 2

## 2015-03-25 MED ORDER — FENTANYL CITRATE (PF) 100 MCG/2ML IJ SOLN
50.0000 ug | Freq: Once | INTRAMUSCULAR | Status: DC
  Filled 2015-03-25: qty 2

## 2015-03-25 MED ORDER — HYDROCODONE-ACETAMINOPHEN 10-325 MG PO TABS
1.0000 | ORAL_TABLET | Freq: Three times a day (TID) | ORAL | Status: DC | PRN
  Administered 2015-03-25: 1 via ORAL
  Filled 2015-03-25: qty 1

## 2015-03-25 MED ORDER — PANTOPRAZOLE SODIUM 40 MG PO TBEC
40.0000 mg | DELAYED_RELEASE_TABLET | Freq: Every day | ORAL | Status: DC
  Administered 2015-03-25 – 2015-03-26 (×2): 40 mg via ORAL
  Filled 2015-03-25 (×2): qty 1

## 2015-03-25 MED ORDER — METOPROLOL SUCCINATE ER 25 MG PO TB24: 25.0000 mg | ORAL_TABLET | Freq: Every day | ORAL | Status: DC

## 2015-03-25 NOTE — ED Notes (Signed)
Took over care for the patient at this time.

## 2015-03-25 NOTE — ED Notes (Signed)
Attempted Report x1.   

## 2015-03-25 NOTE — ED Notes (Signed)
Cardiology at the bedside.

## 2015-03-25 NOTE — ED Notes (Signed)
Pt made aware of bed assignment 

## 2015-03-25 NOTE — ED Provider Notes (Signed)
CSN: [id number]     Arrival date & time 03/25/15  1802 History   First MD Initiated Contact with Patient 03/25/15 1814     Chief Complaint  Patient presents with  . Chest Pain     (Consider location/radiation/quality/duration/timing/severity/associated sxs/prior Treatment) HPI Comments: [name] is a 44 y/o F with a pmhx of type 1 DM, TIA (08/2014) on aspirin daily, and PE who presents today with chest pain. Pt states that she has been having intermittent chest pain for 2 months. The pain is located in the center of her chest and radiates down her left arm and back. Pt has been followed by her PCP for this issue and referred her to a cardiologist who she saw this morning. Pt presented to the cardiologist with CP 5/10. Cardiologist placed pt on metoprolol and recommended a cardiolite stress test later this week. Per the cardiology note, "low threshold to cath if no improvement." Pt returned home, took first dose of BB. The pain worsened to a 9/10 so pt called EMS. Pt was given 325 ASA, 2 nitro which dropped her BP from 130 to 98. Pt has associated nausea, light headedness and SOB. No syncope, fevers, chills, change in vision.   Pt has history of PE 6 years ago, is on exogenous estrogen and smokes 1ppd. No leg swelling.   Patient is a 44 y.o. female presenting with chest pain. The history is provided by the patient.  Chest Pain Associated symptoms: back pain, nausea, numbness ( chronic peripheral neuropathy), shortness of breath and weakness   Associated symptoms: no abdominal pain, no fever, no headache, no palpitations and not vomiting     Past Medical History  Diagnosis Date  . Diabetes mellitus without complication   . Neuropathy   . Depression   . GERD (gastroesophageal reflux disease)   . Hiatal hernia   . Migraine headache   . IBS (irritable bowel syndrome)   . Vertigo   . Hypertension   . Hyperlipidemia   . Anxiety   . Abdominal wall mass of right flank 04/11/13  .  Abrasion of leg with infection 02/18/14  . Benign paroxysmal vertigo 09/04/13  . Bronchitis, acute 02/28/14  . Bunion of great toe 04/11/13  . Cervicalgia 06/13/14  . Dorsalgia 05/17/14  . Dysuria 03/27/14  . Gastroenteritis 01/28/14  . Gastro-esophageal reflux disease without esophagitis 01/28/14  . Pain in thoracic spine 06/13/14  . 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 Peripheral neuropathy 11/05/13  . Subungual hematoma of digit of hand   . Tendonitis 03/07/13  . Tobacco use    Past Surgical History  Procedure Laterality Date  . Cesarean section    . Tubal ligation    . Abdominal hysterectomy    . Appendectomy    . Cholecystectomy    . Cyst excision      right breast  . Cardiac catheterization  2006    normal coronary arteries   Family History  Problem Relation Age of Onset  . Stroke Mother 42  . Heart attack Mother 40  . Cancer Sister     colon  . Heart failure Maternal Grandmother 65  . Cancer Maternal Grandfather     prostate  . Heart failure Paternal Grandmother 46  . Heart failure Paternal Grandfather 49   Social History  Substance Use Topics  . Smoking status: Current Every Day Smoker -- 0.50 packs/day for 11 years  . Smokeless tobacco: Never Used     Comment: patient is  aware that she needs to quit smoking  . Alcohol Use: No   OB History    No data available     Review of Systems  Constitutional: Negative for fever and chills.  HENT: Negative.   Eyes: Negative for visual disturbance.  Respiratory: Positive for chest tightness and shortness of breath.   Cardiovascular: Positive for chest pain. Negative for palpitations and leg swelling.  Gastrointestinal: Positive for nausea. Negative for vomiting, abdominal pain and diarrhea.  Endocrine: Positive for polydipsia and polyuria.  Genitourinary: Negative for dysuria and hematuria.  Musculoskeletal: Positive for back pain. Negative for neck pain and neck stiffness.       Pain in left arm   Skin: Negative  for color change, pallor, rash and wound.  Neurological: Positive for weakness, light-headedness and numbness ( chronic peripheral neuropathy). Negative for syncope, facial asymmetry, speech difficulty and headaches.  Psychiatric/Behavioral: Negative.   All other systems reviewed and are negative.     Allergies  Clindamycin/lincomycin; Codeine; Penicillins; and Sulfa antibiotics  Home Medications   Prior to Admission medications   Medication Sig Start Date End Date Taking? Authorizing Provider  insulin lispro (HUMALOG) 100 UNIT/ML injection 70 Units. 01/02/15  Yes Historical Provider, MD  lisinopril (PRINIVIL,ZESTRIL) 5 MG tablet Take 2.5 mg by mouth daily. 03/25/15  Yes Historical Provider, MD  metoprolol succinate (TOPROL XL) 25 MG 24 hr tablet Take 25 mg by mouth daily. 03/25/15 03/24/16 Yes Historical Provider, MD  Albuterol Sulfate 108 (90 BASE) MCG/ACT AEPB Inhale 1 puff into the lungs every 4 (four) hours as needed.    Historical Provider, MD  Alcohol Swabs (ALCOHOL PREP PAD) 70 % PADS  06/07/14   Historical Provider, MD  aspirin EC 81 MG tablet Take 1 tablet (81 mg total) by mouth daily. 08/18/14   Bobby Rumpf York, PA-C  clonazePAM (KLONOPIN) 1 MG tablet Take 1 mg by mouth at bedtime.    Historical Provider, MD  clonazePAM (KLONOPIN) 1 MG tablet Take 1 mg by mouth 3 (three) times daily as needed.    Historical Provider, MD  docusate sodium (COLACE) 100 MG capsule Take 100 mg by mouth 2 (two) times daily.    Historical Provider, MD  escitalopram (LEXAPRO) 20 MG tablet Take 20 mg by mouth daily.    Historical Provider, MD  estradiol (ESTRACE) 0.5 MG tablet Take 0.5 mg by mouth every morning.    Historical Provider, MD  estradiol (ESTRACE) 1 MG tablet Take 1 mg by mouth daily.    Historical Provider, MD  FIBER PO Take 1 capsule by mouth daily.    Historical Provider, MD  gabapentin (NEURONTIN) 300 MG capsule Take 300 mg by mouth 2 (two) times daily.    Historical Provider, MD  gabapentin  (NEURONTIN) 400 MG capsule Take 300 mg by mouth daily.     Historical Provider, MD  glucagon (GLUCAGON EMERGENCY) 1 MG injection Inject 1 mg into the muscle once as needed. 11/04/14   Philemon Kingdom, MD  glucose blood (FREESTYLE TEST STRIPS) test strip Use to test blood sugar 4-6 times daily as instructed. Dx: E10.40 12/25/14   Philemon Kingdom, MD  glucose blood (ONETOUCH VERIO) test strip Use to test blood sugar 4-6 times daily as instructed. Dx: E10.40 11/07/14   Philemon Kingdom, MD  HYDROcodone-acetaminophen (NORCO) 10-325 MG per tablet Take 1 tablet by mouth every 6 (six) hours as needed for moderate pain.    Historical Provider, MD  Hydrocodone-Acetaminophen 5-300 MG TABS  08/28/14   Historical Provider,  MD  insulin glargine (LANTUS) 100 UNIT/ML injection Use 60 units in insulin pump daily. Dx: E10.40 12/25/14   Philemon Kingdom, MD  insulin lispro (HUMALOG) 100 UNIT/ML injection Use approx 80 units daily in insulin pump. 01/02/15   Philemon Kingdom, MD  lisinopril (PRINIVIL,ZESTRIL) 5 MG tablet Take 5 mg by mouth daily.    Historical Provider, MD  LORazepam (ATIVAN) 1 MG tablet Take 1 mg by mouth every 8 (eight) hours as needed for anxiety.    Historical Provider, MD  meclizine (ANTIVERT) 25 MG tablet Take 25 mg by mouth every 6 (six) hours as needed.  08/05/14   Historical Provider, MD  Multiple Vitamin (MULTIVITAMIN WITH MINERALS) TABS Take 1 tablet by mouth every morning.    Historical Provider, MD  Omega-3 Fatty Acids (FISH OIL) 1000 MG CAPS Take 1 capsule by mouth daily.    Historical Provider, MD  [facility] DELICA LANCETS FINE MISC Use to test blood sugar 4-6 times daily as instructed. Dx: E10.40 11/07/14   Philemon Kingdom, MD  pravastatin (PRAVACHOL) 10 MG tablet Take 10 mg by mouth daily.    Historical Provider, MD  promethazine (PHENERGAN) 25 MG tablet Take 1 tablet (25 mg total) by mouth every 6 (six) hours as needed for nausea or vomiting. 12/21/14   Kristen N Ward, DO  simvastatin (ZOCOR) 40  MG tablet Take 40 mg by mouth daily.    Historical Provider, MD   BP 94/60 mmHg  Pulse 60  Temp(Src) 97.5 F (36.4 C) (Oral)  Resp 25  SpO2 98% Physical Exam  Constitutional: She is oriented to person, place, and time. She appears well-developed and well-nourished. No distress.  HENT:  Head: Normocephalic and atraumatic.  Mouth/Throat: No oropharyngeal exudate.  Eyes: Conjunctivae are normal. Pupils are equal, round, and reactive to light.  Neck: No JVD present.  Cardiovascular: Normal rate, regular rhythm, normal heart sounds and intact distal pulses.  Exam reveals no gallop and no friction rub.   No murmur heard. Pulmonary/Chest: Effort normal and breath sounds normal. No respiratory distress. She has no wheezes. She has no rales. She exhibits no tenderness.  No accessory muscle use  Abdominal: Soft. Bowel sounds are normal. She exhibits no distension. There is no tenderness. There is no rebound and no guarding.  Musculoskeletal: Normal range of motion. She exhibits no edema.  Lymphadenopathy:    She has no cervical adenopathy.  Neurological: She is alert and oriented to person, place, and time. No cranial nerve deficit.  Skin: Skin is warm and dry. No rash noted. She is not diaphoretic. No erythema. No pallor.  Psychiatric: She has a normal mood and affect. Her behavior is normal. Judgment and thought content normal.  Vitals reviewed.   ED Course  Procedures (including critical care time) Pt seen for chest pain Pt given morphine and zofran  7:45 PM Spoke with cardiology who will consult. The recommend internal medicine admission.  7:59 PM Spoke with Dr. Blaine Hamper. Will admit to [facility].   Labs Review Labs Reviewed  BASIC METABOLIC PANEL - Abnormal; Notable for the following:    Sodium 134 (*)    Chloride 100 (*)    Glucose, Bld 442 (*)    All other components within normal limits  CBC  PROTIME-INR  I-STAT TROPOININ, ED    Imaging Review Dg Chest 2  View  03/25/2015   CLINICAL DATA:  Intermittent chest pain for several days.  EXAM: CHEST  2 VIEW  COMPARISON:  03/22/2015  FINDINGS: The cardiac  silhouette, mediastinal and hilar contours are normal. The lungs are clear. No pleural effusion. The bony thorax is intact.  IMPRESSION: No acute cardiopulmonary findings.   Electronically Signed   By: Marijo Sanes M.D.   On: 03/25/2015 18:47   I have personally reviewed and evaluated these images and lab results as part of my medical decision-making.   EKG Interpretation None      ED ECG REPORT   Date: 03/25/2015  Rate: 68 bpm  Rhythm: normal sinus rhythm  QRS Axis: normal  Intervals: normal  ST/T Wave abnormalities: nonspecific T wave changes  Conduction Disutrbances:Left atrial enlargement  Narrative Interpretation:   Old EKG Reviewed: unchanged  I have personally reviewed the EKG tracing and agree with the computerized printout as noted.  MDM   Final diagnoses:  Chest pain, unspecified chest pain type    Pt seen for intermittent CP that has been persisting for 2 months. Seen by out patient cardiologist who recommend stress test this week, with low threshold to cath. CP today was made worse by taking metoprolol. Pt given 2 nitro en route to ED which dropped BP to 98. Given fluid bolus in ED. Pressure improved. Troponin negative, EKG unremarkable. MI unlikely. Pt high risk for PE. Ddimer most likely unhelpful due to high risk. CXR negative. PNA and pneumothorax unlikely. Spoke with cardiology who will consult, recommend internal medicine admission. Pt will await tele bed.     Dondra Spry Melfa, PA-C 03/25/15 2045  Serita Grit, MD 03/26/15 [phone]

## 2015-03-25 NOTE — ED Notes (Signed)
Patient come from Home States she has been seeing a Film/video editor for 2 months. Patient has been having intermitted Chest Pain since then and states MD wrote prescription for betablocker. Patient took betablocker and pain was worse. Patient took 324 ASA and 2 Nitro. Nitro droppd blood pressure from 130 to 98. Patient complains of 10/10 Chest pain.

## 2015-03-25 NOTE — H&P (Addendum)
[facility] History and Physical  Jeremiah Tarpley UXL:[id number] DOB: February 27, 1971 DOA: 03/25/2015  Referring physician: ED physician PCP: Denny Levy, PA  Specialists:   Chief Complaint: chest pain  HPI: [name] is a 44 y.o. female with PMH of type 1 DM, TIA (08/2014), hyperlipidemia, GERD, anxiety, IBS, vertigo, tobacco abuse, history of PE, who presents with chest pain.  Pt states that she has been having intermittent chest pain for 2 months. The pain is located in the left side of the chest, 10 out of 10 in severity, radiating to left arm and back. Her chest pain is pleuritic. It is aggravated by deep breath. She has bilateral lower leg pain due to neuropathy, but not particularly localized to the calf areas. No recent long distance traveling history. Pt has been followed by her PCP for this issue and referred her to a cardiologist who she saw this morning and placed pt on metoprolol and recommended a cardiolite stress test later this week. Per the cardiology note, "low threshold to cath if no improvement." Pt took first dose of BB,but her chest pain has worsened. Pt was given 325 ASA, 2 nitro which dropped her BP from 130 to 98. Pt has associated nausea, light headedness and SOB. Patient does not have fever, chills, cough, abdominal pain, diarrhea, symptoms of UTI, negative edema, unilateral weakness.  In ED, patient was found to have WBC 7.0, troponin negative, and are 0.93, temperature normal, no tachycardia, electrolytes okay, negative chest x-ray for acute abnormalities. Patient is admitted to inpatient for further urinalysis and treatment. Cardiology was consulted.  Where does patient live?   At home  Can patient participate in ADLs?  Yes  Review of Systems:   General: no fevers, chills, no changes in body weight, has poor appetite, has fatigue HEENT: no blurry vision, hearing changes or sore throat Pulm: has dyspnea, no coughing, wheezing CV: has chest pain, no  palpitations Abd: has nausea, no vomiting, abdominal pain, diarrhea, constipation GU: no dysuria, burning on urination, increased urinary frequency, hematuria  Ext: no leg edema Neuro: no unilateral weakness, numbness, or tingling, no vision change or hearing loss Skin: no rash MSK: No muscle spasm, no deformity, no limitation of range of movement in spin Heme: No easy bruising.  Travel history: No recent long distant travel.  Allergy:  Allergies  Allergen Reactions  . Zocor [Simvastatin] Other (See Comments)    Muscle aches and pains  . Clindamycin/Lincomycin Rash  . Codeine Rash  . Penicillins Rash  . Sulfa Antibiotics Rash         Past Medical History  Diagnosis Date  . Diabetes mellitus without complication   . Neuropathy   . Depression   . GERD (gastroesophageal reflux disease)   . Hiatal hernia   . Migraine headache   . IBS (irritable bowel syndrome)   . Vertigo   . Hypertension   . Hyperlipidemia   . Anxiety   . Abdominal wall mass of right flank 04/11/13  . Abrasion of leg with infection 02/18/14  . Benign paroxysmal vertigo 09/04/13  . Bronchitis, acute 02/28/14  . Bunion of great toe 04/11/13  . Cervicalgia 06/13/14  . Dorsalgia 05/17/14  . Dysuria 03/27/14  . Gastroenteritis 01/28/14  . Gastro-esophageal reflux disease without esophagitis 01/28/14  . Pain in thoracic spine 06/13/14  . 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 Peripheral neuropathy 11/05/13  . Subungual hematoma of digit of hand   . Tendonitis 03/07/13  . Tobacco use   .  TIA (transient ischemic attack)   . PE (physical exam), annual     Past Surgical History  Procedure Laterality Date  . Cesarean section    . Tubal ligation    . Abdominal hysterectomy    . Appendectomy    . Cholecystectomy    . Cyst excision      right breast  . Cardiac catheterization  2006    normal coronary arteries    Social History:  reports that she has been smoking.  She has never used smokeless tobacco. She  reports that she does not drink alcohol or use illicit drugs.  Family History:  Family History  Problem Relation Age of Onset  . Stroke Mother 82  . Heart attack Mother 23  . Cancer Sister     colon  . Heart failure Maternal Grandmother 65  . Cancer Maternal Grandfather     prostate  . Heart failure Paternal Grandmother 44  . Heart failure Paternal Grandfather 60     Prior to Admission medications   Medication Sig Start Date End Date Taking? Authorizing Provider  aspirin EC 81 MG tablet Take 1 tablet (81 mg total) by mouth daily. 08/18/14  Yes Marianne L York, PA-C  clonazePAM (KLONOPIN) 1 MG tablet Take 1 mg by mouth 2 (two) times daily as needed for anxiety.    Yes Historical Provider, MD  escitalopram (LEXAPRO) 20 MG tablet Take 20 mg by mouth at bedtime.    Yes Historical Provider, MD  estradiol (ESTRACE) 1 MG tablet Take 1 mg by mouth daily.   Yes Historical Provider, MD  gabapentin (NEURONTIN) 300 MG capsule Take 600 mg by mouth daily.    Yes Historical Provider, MD  HYDROcodone-acetaminophen (NORCO) 10-325 MG per tablet Take 1 tablet by mouth every 8 (eight) hours as needed for moderate pain.    Yes Historical Provider, MD  insulin lispro (HUMALOG) 100 UNIT/ML injection Use approx 80 units daily in insulin pump. Patient taking differently: Inject 70 Units into the skin continuous. in insulin pump 01/02/15  Yes Philemon Kingdom, MD  lisinopril (PRINIVIL,ZESTRIL) 5 MG tablet Take 5 mg by mouth daily.   Yes Historical Provider, MD  lisinopril (PRINIVIL,ZESTRIL) 5 MG tablet Take 2.5 mg by mouth daily. 03/25/15  Yes Historical Provider, MD  metoprolol succinate (TOPROL XL) 25 MG 24 hr tablet Take 25 mg by mouth daily. 03/25/15 03/24/16 Yes Historical Provider, MD  Multiple Vitamin (MULTIVITAMIN WITH MINERALS) TABS Take 1 tablet by mouth every morning.   Yes Historical Provider, MD  pravastatin (PRAVACHOL) 10 MG tablet Take 10 mg by mouth daily.   Yes Historical Provider, MD  Albuterol  Sulfate 108 (90 BASE) MCG/ACT AEPB Inhale 1 puff into the lungs every 4 (four) hours as needed (for wheezing or shortness or breath).     Historical Provider, MD  Alcohol Swabs (ALCOHOL PREP PAD) 70 % PADS  06/07/14   Historical Provider, MD  glucagon (GLUCAGON EMERGENCY) 1 MG injection Inject 1 mg into the muscle once as needed. Patient taking differently: Inject 1 mg into the muscle once as needed (for low blood sugar).  11/04/14   Philemon Kingdom, MD  glucose blood (FREESTYLE TEST STRIPS) test strip Use to test blood sugar 4-6 times daily as instructed. Dx: E10.40 12/25/14   Philemon Kingdom, MD  glucose blood (ONETOUCH VERIO) test strip Use to test blood sugar 4-6 times daily as instructed. Dx: E10.40 11/07/14   Philemon Kingdom, MD  Hydrocodone-Acetaminophen 5-300 MG TABS Take 1 tablet by mouth  every 6 (six) hours as needed (for pain).  08/28/14   Historical Provider, MD  LORazepam (ATIVAN) 1 MG tablet Take 1 mg by mouth every 8 (eight) hours as needed for anxiety.    Historical Provider, MD  [facility] DELICA LANCETS FINE MISC Use to test blood sugar 4-6 times daily as instructed. Dx: E10.40 11/07/14   Philemon Kingdom, MD  promethazine (PHENERGAN) 25 MG tablet Take 1 tablet (25 mg total) by mouth every 6 (six) hours as needed for nausea or vomiting. Patient not taking: Reported on 03/25/2015 12/21/14   Delice Bison Ward, DO    Physical Exam: Filed Vitals:   03/25/15 1930 03/25/15 1945 03/25/15 2015 03/25/15 2030  BP: 94/55 94/58 91/65  107/64  Pulse: 68 65 64 66  Temp:      TempSrc:      Resp: 21 13 15 17   SpO2: 97% 96% 98% 97%   General: Not in acute distress. Dry mucus and membrane. HEENT:       Eyes: PERRL, EOMI, no scleral icterus.       ENT: No discharge from the ears and nose, no pharynx injection, no tonsillar enlargement.        Neck: No JVD, no bruit, no mass felt. Heme: No neck lymph node enlargement. Cardiac: S1/S2, RRR, No murmurs, No gallops or rubs. Pulm: No rales, wheezing,  rhonchi or rubs. Abd: Soft, nondistended, nontender, no rebound pain, no organomegaly, BS present. Ext: No pitting leg edema bilaterally. 2+DP/PT pulse bilaterally. Musculoskeletal: No joint deformities, No joint redness or warmth, no limitation of ROM in spin. Skin: No rashes.  Neuro: Alert, oriented X3, cranial nerves II-XII grossly intact, muscle strength 5/5 in all extremities, sensation to light touch intact.  Psych: Patient is not psychotic, no suicidal or hemocidal ideation.  Labs on Admission:  Basic Metabolic Panel:  Recent Labs Lab 03/25/15 1838  NA 134*  K 4.5  CL 100*  CO2 28  GLUCOSE 442*  BUN 11  CREATININE 0.78  CALCIUM 9.2   Liver Function Tests: No results for input(s): AST, ALT, ALKPHOS, BILITOT, PROT, ALBUMIN in the last 168 hours. No results for input(s): LIPASE, AMYLASE in the last 168 hours. No results for input(s): AMMONIA in the last 168 hours. CBC:  Recent Labs Lab 03/25/15 1838  WBC 7.0  HGB 13.8  HCT 39.6  MCV 89.2  PLT 216   Cardiac Enzymes: No results for input(s): CKTOTAL, CKMB, CKMBINDEX, TROPONINI in the last 168 hours.  BNP (last 3 results) No results for input(s): BNP in the last 8760 hours.  ProBNP (last 3 results) No results for input(s): PROBNP in the last 8760 hours.  CBG: No results for input(s): GLUCAP in the last 168 hours.  Radiological Exams on Admission: Dg Chest 2 View  03/25/2015   CLINICAL DATA:  Intermittent chest pain for several days.  EXAM: CHEST  2 VIEW  COMPARISON:  03/22/2015  FINDINGS: The cardiac silhouette, mediastinal and hilar contours are normal. The lungs are clear. No pleural effusion. The bony thorax is intact.  IMPRESSION: No acute cardiopulmonary findings.   Electronically Signed   By: Marijo Sanes M.D.   On: 03/25/2015 18:47    EKG: Independently reviewed.  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 She is on Estradiol which also increase the risk of blood clot. ACS is also possible given significant risk factors, including hypertension, hyperlipidemia, poorly controlled type 1 diabetes. She had cath in 2009, 2008, 2007, 2006 all with patent coronaries. Nuclear stress 2013 had no ischemia. She had two negative CTA for PE in 2006 and 2009. Cardiology was consulted.  -will admit to tele bed -start D-dimer. If d-dimer is positive, will do CTA to r/o PE -Appreciate cardiology's consultation. Will follow up recommendations to monitor overnight and cycle ekg and enzymes. Plan would be for exercise cardiolite in the AM unless abnormal EKG or enzymes overnight per Dr. Harl Bowie - cycle CE q6 x3 and repeat her EKG in the am  - Nitroglycerin, Morphine, and aspirin, pravastatin - 2d echo - check A1c and FLP, UDS and HIV Ab - hold Estradiol  HTN: -continue lisinopril, but will start first dose tomorrow AM due to soft bp. -on metoprolol.  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 Patient is using insulin pump at home. Her CBG is 442 on admission. -continue insulin pump -Neurontin -IFV: 2L NS bolus and then 125 cc/h -check A1c -Consult to diabetic educator for help in managing insulin pump -cbg q3h  HLD: Last LDL was 88 on 08/17/14 -Continue home medications:  -Check FLP  Anxiety: Stable, no suicidal or homicidal ideations. -Continue home medications: Klonopin, Lexapro  Tobacco abuse: -Did counseling about importance of quitting smoking -Nicotine patch  Hx  of TIA:  -on ASA  DVT ppx: SQ Heparin    Code Status: Full code Family Communication: None at bed side.  Disposition Plan: Admit to inpatient   Date of Service 03/25/2015    Ivor Costa [facility] Pager [phone]  If 7PM-7AM, please contact night-coverage [email or web address] Password [facility] 03/25/2015, 9:05 PM

## 2015-03-25 NOTE — Consult Note (Signed)
Patient ID: [name] MRN: [id number], DOB/AGE: 44-Aug-1972   Admit date: 03/25/2015   Primary Physician: Denny Levy, [place] Primary Cardiologist: New  Pt. Profile:  44 year old female with multiple cardiac risk factors including type 1 insulin-dependent diabetes, hypertension, hyperlipidemia, tobacco abuse and strong family history of premature CAD presenting with chest pain concerning for unstable angina.  Problem List  Past Medical History  Diagnosis Date  . Diabetes mellitus without complication   . Neuropathy   . Depression   . GERD (gastroesophageal reflux disease)   . Hiatal hernia   . Migraine headache   . IBS (irritable bowel syndrome)   . Vertigo   . Hypertension   . Hyperlipidemia   . Anxiety   . Abdominal wall mass of right flank 04/11/13  . Abrasion of leg with infection 02/18/14  . Benign paroxysmal vertigo 09/04/13  . Bronchitis, acute 02/28/14  . Bunion of great toe 04/11/13  . Cervicalgia 06/13/14  . Dorsalgia 05/17/14  . Dysuria 03/27/14  . Gastroenteritis 01/28/14  . Gastro-esophageal reflux disease without esophagitis 01/28/14  . Pain in thoracic spine 06/13/14  . 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 Peripheral neuropathy 11/05/13  . Subungual hematoma of digit of hand   . Tendonitis 03/07/13  . Tobacco use     Past Surgical History  Procedure Laterality Date  . Cesarean section    . Tubal ligation    . Abdominal hysterectomy    . Appendectomy    . Cholecystectomy    . Cyst excision      right breast  . Cardiac catheterization  2006    normal coronary arteries     Allergies  Allergies  Allergen Reactions  . Zocor [Simvastatin] Other (See Comments)    Muscle aches and pains  . Clindamycin/Lincomycin Rash  . Codeine Rash  . Penicillins Rash  . Sulfa Antibiotics Rash         HPI  The patient is a 44 year old female with no prior cardiac history, presenting to the [facility] emergency department with a complaint chest pain.  She has multiple cardiac risk factors including strong family history of CAD. Her mother died from a myocardial infarction at age 57. Her maternal aunt also died from an MI at the age of 28. She also has a long history of type 1 insulin-dependent diabetes. She was first diagnosed at age 80. She wears an insulin pump. She also has a history of treated hypertension and hyperlipidemia. She has a 12 year history of tobacco abuse. She is a current smoker. Also reports a history of hysterectomy due to severe endometriosis at the age of 27. Since that time, she has been on chronic estrogen replacement therapy. Earlier this year she reports that she suffered a TIA. She has since been on daily aspirin therapy.  She reports that over the last month she has had intermittent substernal chest discomfort. Pain described as tight/pressure-like sensation radiating to the left shoulder and upper arm. Symptoms are worse with exertion. She also notes mild associated dyspnea and diaphoresis. Given her recurrent symptoms, she made an appointment to be seen by a cardiologist with [facility] in [place] earlier today. On arrival to the doctor's office she developed recurrent chest discomfort this time occurring at rest. She reports that she was prescribed Metroprolol and the doctor had arranged for her to complete further outpatient workup. He instructed her to report to the closest ED if any worsening symptoms. She reports that after taking her  first of metroprolol earlier today her chest pain worsened, prompting her to seek emergency evaluation.    In the ED, EKG demonstrates normal sinus rhythm. Point of care troponin is negative. CXR is unremarkable.    Home Medications  Prior to Admission medications   Medication Sig Start Date End Date Taking? Authorizing Provider  aspirin EC 81 MG tablet Take 1 tablet (81 mg total) by mouth daily. 08/18/14  Yes Marianne L York, PA-C  clonazePAM (KLONOPIN) 1 MG tablet Take 1 mg by  mouth 2 (two) times daily as needed for anxiety.    Yes Historical Provider, MD  escitalopram (LEXAPRO) 20 MG tablet Take 20 mg by mouth at bedtime.    Yes Historical Provider, MD  estradiol (ESTRACE) 1 MG tablet Take 1 mg by mouth daily.   Yes Historical Provider, MD  gabapentin (NEURONTIN) 300 MG capsule Take 600 mg by mouth daily.    Yes Historical Provider, MD  HYDROcodone-acetaminophen (NORCO) 10-325 MG per tablet Take 1 tablet by mouth every 8 (eight) hours as needed for moderate pain.    Yes Historical Provider, MD  insulin lispro (HUMALOG) 100 UNIT/ML injection Use approx 80 units daily in insulin pump. Patient taking differently: Inject 70 Units into the skin continuous. in insulin pump 01/02/15  Yes Philemon Kingdom, MD  lisinopril (PRINIVIL,ZESTRIL) 5 MG tablet Take 5 mg by mouth daily.   Yes Historical Provider, MD  lisinopril (PRINIVIL,ZESTRIL) 5 MG tablet Take 2.5 mg by mouth daily. 03/25/15  Yes Historical Provider, MD  metoprolol succinate (TOPROL XL) 25 MG 24 hr tablet Take 25 mg by mouth daily. 03/25/15 03/24/16 Yes Historical Provider, MD  Multiple Vitamin (MULTIVITAMIN WITH MINERALS) TABS Take 1 tablet by mouth every morning.   Yes Historical Provider, MD  pravastatin (PRAVACHOL) 10 MG tablet Take 10 mg by mouth daily.   Yes Historical Provider, MD  Albuterol Sulfate 108 (90 BASE) MCG/ACT AEPB Inhale 1 puff into the lungs every 4 (four) hours as needed (for wheezing or shortness or breath).     Historical Provider, MD  Alcohol Swabs (ALCOHOL PREP PAD) 70 % PADS  06/07/14   Historical Provider, MD  clonazePAM (KLONOPIN) 1 MG tablet Take 1 mg by mouth 3 (three) times daily as needed for anxiety.     Historical Provider, MD  gabapentin (NEURONTIN) 400 MG capsule Take 300 mg by mouth daily.     Historical Provider, MD  glucagon (GLUCAGON EMERGENCY) 1 MG injection Inject 1 mg into the muscle once as needed. Patient taking differently: Inject 1 mg into the muscle once as needed (for low  blood sugar).  11/04/14   Philemon Kingdom, MD  glucose blood (FREESTYLE TEST STRIPS) test strip Use to test blood sugar 4-6 times daily as instructed. Dx: E10.40 12/25/14   Philemon Kingdom, MD  glucose blood (ONETOUCH VERIO) test strip Use to test blood sugar 4-6 times daily as instructed. Dx: E10.40 11/07/14   Philemon Kingdom, MD  Hydrocodone-Acetaminophen 5-300 MG TABS Take 1 tablet by mouth every 6 (six) hours as needed (for pain).  08/28/14   Historical Provider, MD  insulin lispro (HUMALOG) 100 UNIT/ML injection 70 Units. 01/02/15   Historical Provider, MD  LORazepam (ATIVAN) 1 MG tablet Take 1 mg by mouth every 8 (eight) hours as needed for anxiety.    Historical Provider, MD  meclizine (ANTIVERT) 25 MG tablet Take 25 mg by mouth every 6 (six) hours as needed for dizziness.  08/05/14   Historical Provider, MD  Jonetta Speak LANCETS  FINE MISC Use to test blood sugar 4-6 times daily as instructed. Dx: E10.40 11/07/14   Philemon Kingdom, MD  promethazine (PHENERGAN) 25 MG tablet Take 1 tablet (25 mg total) by mouth every 6 (six) hours as needed for nausea or vomiting. Patient not taking: Reported on 03/25/2015 12/21/14   Delice Bison Ward, DO  simvastatin (ZOCOR) 40 MG tablet Take 40 mg by mouth daily.    Historical Provider, MD    Family History  Family History  Problem Relation Age of Onset  . Stroke Mother 42  . Heart attack Mother 50  . Cancer Sister     colon  . Heart failure Maternal Grandmother 65  . Cancer Maternal Grandfather     prostate  . Heart failure Paternal Grandmother 75  . Heart failure Paternal Grandfather 80    Social History  Social History   Social History  . Marital Status: Divorced    Spouse Name: N/A  . Number of Children: N/A  . Years of Education: N/A   Occupational History  . Not on file.   Social History Main Topics  . Smoking status: Current Every Day Smoker -- 0.50 packs/day for 11 years  . Smokeless tobacco: Never Used     Comment: patient is aware  that she needs to quit smoking  . Alcohol Use: No  . Drug Use: No  . Sexual Activity: No   Other Topics Concern  . Not on file   Social History Narrative     Review of Systems General:  No chills, fever, night sweats or weight changes.  Cardiovascular:  No chest pain, dyspnea on exertion, edema, orthopnea, palpitations, paroxysmal nocturnal dyspnea. Dermatological: No rash, lesions/masses Respiratory: No cough, dyspnea Urologic: No hematuria, dysuria Abdominal:   No nausea, vomiting, diarrhea, bright red blood per rectum, melena, or hematemesis Neurologic:  No visual changes, wkns, changes in mental status. All other systems reviewed and are otherwise negative except as noted above.  Physical Exam  Blood pressure 94/55, pulse 68, temperature 97.5 F (36.4 C), temperature source Oral, resp. rate 21, SpO2 97 %.  General: Pleasant, NAD Psych: Normal affect. Neuro: Alert and oriented X 3. Moves all extremities spontaneously. HEENT: Normal  Neck: Supple without bruits or JVD. Lungs:  Resp regular and unlabored, CTA. Heart: RRR no s3, s4, or murmurs. Abdomen: Soft, non-tender, non-distended, BS + x 4.  Extremities: No clubbing, cyanosis or edema. DP/PT/Radials 2+ and equal bilaterally.  Labs  Troponin [facility] Test)  Recent Labs  03/25/15 1840  TROPIPOC 0.00   No results for input(s): CKTOTAL, CKMB, TROPONINI in the last 72 hours. Lab Results  Component Value Date   WBC 7.0 03/25/2015   HGB 13.8 03/25/2015   HCT 39.6 03/25/2015   MCV 89.2 03/25/2015   PLT 216 03/25/2015    Recent Labs Lab 03/25/15 1838  NA 134*  K 4.5  CL 100*  CO2 28  BUN 11  CREATININE 0.78  CALCIUM 9.2  GLUCOSE 442*   Lab Results  Component Value Date   CHOL 162 08/17/2014   HDL 54 08/17/2014   LDLCALC 88 08/17/2014   TRIG 100 08/17/2014   Lab Results  Component Value Date   DDIMER * 11/22/2007    0.84        AT THE INHOUSE ESTABLISHED CUTOFF VALUE OF 0.48 ug/mL  FEU, THIS ASSAY HAS BEEN DOCUMENTED IN THE LITERATURE TO HAVE     Radiology/Studies  Dg Chest 2 View  03/25/2015  CLINICAL DATA:  Intermittent chest pain for several days.  EXAM: CHEST  2 VIEW  COMPARISON:  03/22/2015  FINDINGS: The cardiac silhouette, mediastinal and hilar contours are normal. The lungs are clear. No pleural effusion. The bony thorax is intact.  IMPRESSION: No acute cardiopulmonary findings.   Electronically Signed   By: Marijo Sanes M.D.   On: 03/25/2015 18:47    ECG  Normal sinus rhythm. No ischemia.    ASSESSMENT AND PLAN  1. Chest pain: Patient has multiple cardiac risk factors including insulin-dependent diabetes, tobacco abuse, strong family history of premature CAD, hypertension and hyperlipidemia. Her symptoms of exertional substernal chest discomfort radiating to the left shoulder and upper arm, which has now progressed to resting chest discomfort is concerning for unstable angina.We'll make nothing by mouth at midnight. Cycle cardiac enzymes 3. 2-D echocardiogram. Continue aspirin, statin, beta blocker and ACE inhibitor. Consider invasive vs noninvasive ischemic testing tomorrow.   2. Insulin-dependent diabetes: Glucose 442 in ED. Has insulin pump. Management per internal medicine.  3. Hypertension: Well-controlled. Continue current regimen.  4. Hyperlipidemia: On pravastatin as an outpatient. Recommend checking fasting lipid panel in the a.m. and switch to either Lipitor or Crestor if not controlled.   5. Tobacco abuse: Smoking cessation strongly advised.   Lisa Morn, PA-C 03/25/2015, 7:46 PM  Patient seen and discussed with PA Rosita Fire, I agree with her documentation. 44 yo female hx of DM1 that has been poorly controlled according to notes, hx of TIA, HTN, HL, tobacco abuse, FH of MI with mother having at age 49, reportedly hx of PE, followed by [facility] cardiology admitted with chest pain. She was actually seen by Dr Hamilton Capri of  [facility] today for similar symptoms in clinic. Plans according to note were for outpatient nuclear stress as well as the addition of metoprolol as antianginal. Her symptoms progressed after her appointment and she came to the ER.   From further records she has a long history of chest pain with multiple negative CT PE scans. She had a cath in 2009, 2008, 2007, 2006 all with patent coronaries. Nuclear stress 2013no ischemia.  Hgb 13.8, Plt 216, Cr 0.78, Trop neg x1 EKG  CXR no acute process  44 yo female with multiple CAD risk factors, very long history with several year history of testing as described above including multiple caths with patent coronaries. There is mention of prior PE in her records however all the CT PEs I reviewed were negative, I am unclear of this diagnosis. From cardiac standpoint will monitor overnight and cycle ekg and enzymes. She describes mixed chest pain. Symptoms constant since 4pm, worst with moving left arm, though she does report some exertional component. Would anticpiate likely noninvasive testing tomorrow unless objective evidence of ischemia. Would also obtain echo.   Zandra Abts MD

## 2015-03-26 ENCOUNTER — Inpatient Hospital Stay (HOSPITAL_COMMUNITY): Payer: Medicare Other

## 2015-03-26 ENCOUNTER — Other Ambulatory Visit (HOSPITAL_COMMUNITY): Payer: Medicare Other

## 2015-03-26 ENCOUNTER — Encounter (HOSPITAL_COMMUNITY): Payer: Self-pay | Admitting: Physician Assistant

## 2015-03-26 ENCOUNTER — Encounter (HOSPITAL_COMMUNITY)
Admission: EM | Disposition: A | Discharge status: Home / Self Care | Payer: Medicare Other | Source: Home / Self Care | Attending: Internal Medicine

## 2015-03-26 DIAGNOSIS — I200 Unstable angina: Secondary | ICD-10-CM

## 2015-03-26 DIAGNOSIS — K219 Gastro-esophageal reflux disease without esophagitis: Secondary | ICD-10-CM

## 2015-03-26 DIAGNOSIS — R079 Chest pain, unspecified: Principal | ICD-10-CM

## 2015-03-26 DIAGNOSIS — I209 Angina pectoris, unspecified: Secondary | ICD-10-CM

## 2015-03-26 DIAGNOSIS — Z720 Tobacco use: Secondary | ICD-10-CM

## 2015-03-26 DIAGNOSIS — I10 Essential (primary) hypertension: Secondary | ICD-10-CM

## 2015-03-26 HISTORY — PX: CARDIAC CATHETERIZATION: SHX172

## 2015-03-26 LAB — GLUCOSE, CAPILLARY
GLUCOSE-CAPILLARY: 154 mg/dL — AB (ref 65–99)
GLUCOSE-CAPILLARY: 221 mg/dL — AB (ref 65–99)
Glucose-Capillary: 67 mg/dL (ref 65–99)
Glucose-Capillary: 46 mg/dL — ABNORMAL LOW (ref 65–99)
Glucose-Capillary: 141 mg/dL — ABNORMAL HIGH (ref 65–99)

## 2015-03-26 LAB — CBC
HEMATOCRIT: 35.5 % — AB (ref 36.0–46.0)
HEMOGLOBIN: 12 g/dL (ref 12.0–15.0)
MCH: 30.3 pg (ref 26.0–34.0)
MCHC: 33.8 g/dL (ref 30.0–36.0)
MCV: 89.6 fL (ref 78.0–100.0)
Platelets: 180 10*3/uL (ref 150–400)
RBC: 3.96 MIL/uL (ref 3.87–5.11)
RDW: 12.6 % (ref 11.5–15.5)
WBC: 7 10*3/uL (ref 4.0–10.5)

## 2015-03-26 LAB — COMPREHENSIVE METABOLIC PANEL
ALBUMIN: 3 g/dL — AB (ref 3.5–5.0)
ALT: 43 U/L (ref 14–54)
AST: 24 U/L (ref 15–41)
Alkaline Phosphatase: 77 U/L (ref 38–126)
Anion gap: 3 — ABNORMAL LOW (ref 5–15)
BUN: 9 mg/dL (ref 6–20)
CHLORIDE: 108 mmol/L (ref 101–111)
CO2: 28 mmol/L (ref 22–32)
CREATININE: 0.67 mg/dL (ref 0.44–1.00)
Calcium: 8.2 mg/dL — ABNORMAL LOW (ref 8.9–10.3)
GFR calc Af Amer: >60 mL/min (ref >60)
GFR calc non Af Amer: >60 mL/min (ref >60)
Glucose, Bld: 125 mg/dL — ABNORMAL HIGH (ref 65–99)
POTASSIUM: 3.8 mmol/L (ref 3.5–5.1)
SODIUM: 139 mmol/L (ref 135–145)
Total Bilirubin: 0.5 mg/dL (ref 0.3–1.2)
Total Protein: 5.2 g/dL — ABNORMAL LOW (ref 6.5–8.1)

## 2015-03-26 LAB — LIPID PANEL
CHOL/HDL RATIO: 2.8 ratio
Cholesterol: 138 mg/dL (ref 0–200)
Cholesterol: 133 mg/dL (ref 0–200)
HDL: 50 mg/dL (ref >40)
HDL: 49 mg/dL (ref >40)
LDL CALC: 77 mg/dL (ref 0–99)
LDL Cholesterol: 76 mg/dL (ref 0–99)
Total CHOL/HDL Ratio: 2.7 RATIO
Triglycerides: 53 mg/dL (ref <150)
Triglycerides: 42 mg/dL (ref <150)
VLDL: 11 mg/dL (ref 0–40)
VLDL: 8 mg/dL (ref 0–40)

## 2015-03-26 LAB — RAPID URINE DRUG SCREEN, HOSP PERFORMED
AMPHETAMINES: NOT DETECTED
BENZODIAZEPINES: POSITIVE — AB
Barbiturates: NOT DETECTED
Cocaine: NOT DETECTED
Opiates: POSITIVE — AB
Tetrahydrocannabinol: NOT DETECTED

## 2015-03-26 LAB — TROPONIN I: Troponin I: <0.03 ng/mL (ref <0.031)

## 2015-03-26 LAB — HIV ANTIBODY (ROUTINE TESTING W REFLEX): HIV SCREEN 4TH GENERATION: NONREACTIVE

## 2015-03-26 SURGERY — LEFT HEART CATH AND CORONARY ANGIOGRAPHY
Anesthesia: LOCAL

## 2015-03-26 MED ORDER — SODIUM CHLORIDE 0.9 % IV SOLN: 250.0000 mL | INTRAVENOUS | Status: DC | PRN

## 2015-03-26 MED ORDER — MIDAZOLAM HCL 2 MG/2ML IJ SOLN
INTRAMUSCULAR | Status: DC | PRN
  Administered 2015-03-26: 1 mg via INTRAVENOUS

## 2015-03-26 MED ORDER — MORPHINE SULFATE (PF) 2 MG/ML IV SOLN
1.0000 mg | INTRAVENOUS | Status: AC
  Administered 2015-03-26: 1 mg via INTRAVENOUS
  Filled 2015-03-26: qty 1

## 2015-03-26 MED ORDER — METOPROLOL SUCCINATE ER 25 MG PO TB24: 12.5000 mg | ORAL_TABLET | Freq: Every day | ORAL | Status: DC

## 2015-03-26 MED ORDER — MIDAZOLAM HCL 2 MG/2ML IJ SOLN
INTRAMUSCULAR | Status: AC
  Filled 2015-03-26: qty 4

## 2015-03-26 MED ORDER — VERAPAMIL HCL 2.5 MG/ML IV SOLN
INTRAVENOUS | Status: AC
  Filled 2015-03-26: qty 2

## 2015-03-26 MED ORDER — SODIUM CHLORIDE 0.9 % IV SOLN: INTRAVENOUS | Status: DC

## 2015-03-26 MED ORDER — SODIUM CHLORIDE 0.9 % WEIGHT BASED INFUSION
3.0000 mL/kg/h | INTRAVENOUS | Status: DC
Start: 2015-03-27 — End: 2015-03-26

## 2015-03-26 MED ORDER — SODIUM CHLORIDE 0.9 % WEIGHT BASED INFUSION: 1.0000 mL/kg/h | INTRAVENOUS | Status: DC

## 2015-03-26 MED ORDER — ASPIRIN 81 MG PO CHEW: 81.0000 mg | CHEWABLE_TABLET | ORAL | Status: DC

## 2015-03-26 MED ORDER — HEPARIN SODIUM (PORCINE) 1000 UNIT/ML IJ SOLN
INTRAMUSCULAR | Status: DC | PRN
  Administered 2015-03-26: 4000 [IU] via INTRAVENOUS

## 2015-03-26 MED ORDER — PANTOPRAZOLE SODIUM 40 MG PO TBEC: 40.0000 mg | DELAYED_RELEASE_TABLET | Freq: Every day | ORAL | Status: DC

## 2015-03-26 MED ORDER — HEPARIN SODIUM (PORCINE) 1000 UNIT/ML IJ SOLN
INTRAMUSCULAR | Status: AC
  Filled 2015-03-26: qty 1

## 2015-03-26 MED ORDER — ASPIRIN 81 MG PO CHEW: 81.0000 mg | CHEWABLE_TABLET | Freq: Once | ORAL | Status: DC

## 2015-03-26 MED ORDER — FENTANYL CITRATE (PF) 100 MCG/2ML IJ SOLN
INTRAMUSCULAR | Status: DC | PRN
  Administered 2015-03-26: 25 ug via INTRAVENOUS

## 2015-03-26 MED ORDER — ASPIRIN EC 325 MG PO TBEC: 325.0000 mg | DELAYED_RELEASE_TABLET | Freq: Every day | ORAL | Status: DC

## 2015-03-26 MED ORDER — SODIUM CHLORIDE 0.9 % IJ SOLN: 3.0000 mL | INTRAMUSCULAR | Status: DC | PRN

## 2015-03-26 MED ORDER — SODIUM CHLORIDE 0.9 % WEIGHT BASED INFUSION
3.0000 mL/kg/h | INTRAVENOUS | Status: DC
  Administered 2015-03-26: 3 mL/kg/h via INTRAVENOUS

## 2015-03-26 MED ORDER — TRAMADOL HCL 50 MG PO TABS
50.0000 mg | ORAL_TABLET | Freq: Four times a day (QID) | ORAL | Status: DC | PRN
Start: 2015-03-26 — End: 2015-03-26
  Administered 2015-03-26 (×2): 100 mg via ORAL
  Filled 2015-03-26 (×2): qty 2

## 2015-03-26 MED ORDER — SODIUM CHLORIDE 0.9 % IJ SOLN: 3.0000 mL | Freq: Two times a day (BID) | INTRAMUSCULAR | Status: DC

## 2015-03-26 MED ORDER — IOHEXOL 350 MG/ML SOLN
INTRAVENOUS | Status: DC | PRN
  Administered 2015-03-26: 75 mL via INTRA_ARTERIAL

## 2015-03-26 MED ORDER — LIDOCAINE HCL (PF) 1 % IJ SOLN
INTRAMUSCULAR | Status: AC
  Filled 2015-03-26: qty 30

## 2015-03-26 MED ORDER — VERAPAMIL HCL 2.5 MG/ML IV SOLN
INTRAVENOUS | Status: DC | PRN
  Administered 2015-03-26: 14:00:00 via INTRA_ARTERIAL

## 2015-03-26 MED ORDER — DEXTROSE 50 % IV SOLN
INTRAVENOUS | Status: AC
Start: 2015-03-26 — End: 2015-03-26
  Administered 2015-03-26: 12:00:00
  Filled 2015-03-26: qty 50

## 2015-03-26 MED ORDER — FENTANYL CITRATE (PF) 100 MCG/2ML IJ SOLN
INTRAMUSCULAR | Status: AC
  Filled 2015-03-26: qty 4

## 2015-03-26 MED ORDER — HEPARIN (PORCINE) IN NACL 2-0.9 UNIT/ML-% IJ SOLN
INTRAMUSCULAR | Status: AC
  Filled 2015-03-26: qty 1000

## 2015-03-26 MED ORDER — DEXTROSE-NACL 5-0.9 % IV SOLN
INTRAVENOUS | Status: DC
  Administered 2015-03-26: 13:00:00 via INTRAVENOUS

## 2015-03-26 SURGICAL SUPPLY — 10 items
CATH INFINITI 5FR ANG PIGTAIL (CATHETERS) ×2 IMPLANT
CATH OPTITORQUE JACKY 4.0 5F (CATHETERS) ×2 IMPLANT
DEVICE RAD COMP TR BAND LRG (VASCULAR PRODUCTS) ×2 IMPLANT
GLIDESHEATH SLEND SS 6F .021 (SHEATH) ×2 IMPLANT
KIT HEART LEFT (KITS) ×2 IMPLANT
PACK CARDIAC CATHETERIZATION (CUSTOM PROCEDURE TRAY) ×2 IMPLANT
SYR MEDRAD MARK V 150ML (SYRINGE) ×2 IMPLANT
TRANSDUCER W/STOPCOCK (MISCELLANEOUS) ×2 IMPLANT
TUBING CIL FLEX 10 FLL-RA (TUBING) ×2 IMPLANT
WIRE SAFE-T 1.5MM-J .035X260CM (WIRE) ×2 IMPLANT

## 2015-03-26 NOTE — Progress Notes (Signed)
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 Non-invasive testing and echo planned.  SUBJECTIVE: Still with upper L chest pain, rad down L arm and into her back. Upper L chest is tender.  OBJECTIVE Filed Vitals:   03/25/15 2030 03/25/15 2121 03/26/15 0519 03/26/15 0803  BP: 107/64 97/62 81/50  82/50  Pulse: 66 65 62   Temp:  97.7 F (36.5 C) 97.8 F (36.6 C)   TempSrc:  Oral Oral   Resp: 17 18    Height:  5\' 7"  (1.702 m)    Weight:  151 lb 4.8 oz (68.629 kg)    SpO2: 97% 95% 94%     Intake/Output Summary (Last 24 hours) at 03/26/15 7616 Last data filed at 03/26/15 0522  Gross per 24 hour  Intake      0 ml  Output    800 ml  Net   -800 ml   Filed Weights   03/25/15 2121  Weight: 151 lb 4.8 oz (68.629 kg)    PHYSICAL EXAM General: Well developed, well nourished, female in no acute distress. Head: Normocephalic, atraumatic.  Neck: Supple without bruits, JVD not elevated. Lungs:  Resp regular and unlabored, CTA. Heart: RRR, S1, S2, no S3, S4, or murmur; no rub. Abdomen: Soft, non-tender, non-distended, BS + x 4.  Extremities: No clubbing, cyanosis, edema.  Neuro: Alert and oriented X 3. Moves all extremities spontaneously. Psych: Normal affect.  LABS: CBC: Recent Labs  03/25/15 1838 03/26/15 0221  WBC 7.0 7.0  HGB 13.8 12.0  HCT 39.6 35.5*  MCV 89.2  89.6  PLT 216 180   INR: Recent Labs  03/25/15 1838  INR 0.73   Basic Metabolic Panel: Recent Labs  03/25/15 1838 03/26/15 0221  NA 134* 139  K 4.5 3.8  CL 100* 108  CO2 28 28  GLUCOSE 442* 125*  BUN 11 9  CREATININE 0.78 0.67  CALCIUM 9.2 8.2*   Liver Function Tests: Recent Labs  03/26/15 0221  AST 24  ALT 43  ALKPHOS 77  BILITOT 0.5  PROT 5.2*  ALBUMIN 3.0*   Cardiac Enzymes: Recent Labs  03/25/15 2143 03/26/15 0221  TROPONINI <0.03 <0.03    Recent Labs  03/25/15 1840  TROPIPOC 0.00   D-dimer: Recent Labs  03/25/15 2143  DDIMER <0.27   Fasting Lipid Panel: Recent Labs  03/26/15 0221  CHOL 138  HDL 50  LDLCALC 77  TRIG 53  CHOLHDL 2.8   TELE:  SR  Radiology/Studies: Dg Chest 2 View 03/25/2015   CLINICAL DATA:  Intermittent chest pain for several days.  EXAM: CHEST  2 VIEW  COMPARISON:  03/22/2015  FINDINGS: The cardiac silhouette, mediastinal and hilar contours are normal. The lungs are clear. No pleural effusion. The bony thorax is intact.  IMPRESSION: No acute cardiopulmonary findings.   Electronically Signed   By: Marijo Sanes M.D.   On: 03/25/2015 18:47   Current Medications:  . aspirin EC  325  mg Oral Daily  . escitalopram  20 mg Oral QHS  . fentaNYL (SUBLIMAZE) injection  50 mcg Intravenous Once  . gabapentin  600 mg Oral Daily  . heparin  5,000 Units Subcutaneous 3 times per day  . insulin pump   Subcutaneous 6 times per day  . lisinopril  2.5 mg Oral Daily  . metoprolol succinate  25 mg Oral Daily  . multivitamin with minerals  1 tablet Oral q morning - 10a  . nicotine  21 mg Transdermal Daily  . pantoprazole  40 mg Oral Q1200  . pravastatin  10 mg Oral q1800  . sodium chloride  3 mL Intravenous Q12H   . 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   44 year old female with type 1 DM since age 35, FH of premature CAD, HTN, HLP, smoking, postmenopausal post hysterectomy at age 32,  who presented with a typical chest pain. Her ECG and troponins are negative, however the patient is really anxious about the situation and would prefer a cath over stress test. Considering her very high risk of CAD we will schedule a cath.  Dorothy Spark 03/26/2015

## 2015-03-26 NOTE — Discharge Summary (Signed)
Physician Discharge Summary  [name] DPO:[id number] DOB: [birth year] DOA: 03/25/2015  PCP: Denny Levy, PA  Admit date: 03/25/2015 Discharge date: 03/26/2015  Recommendations for Outpatient Follow-up:  1. 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 female with PMH of type 1 DM, TIA (08/2014), hyperlipidemia, GERD, anxiety, IBS, vertigo, tobacco abuse, history of PE, who presented with chest pain.  She reported having intermittent left chest 10/10 pain with radiation down her left arm and back for the last two months.  Pt had been followed by her PCP for this issue who referred her to a cardiologist who placed pt on metoprolol and recommended a cardiolite stress test later this week. Per the cardiology note, "low threshold to cath if no improvement." Pt took first dose of BB but her chest pain has worsened.  She was given 325 ASA, 2 nitro which dropped her BP from 130 to 98. Pt had associated nausea, light headedness and SOB.  In ED, troponin was negative, D-dimer was negative, negative chest x-ray for acute abnormalities.  Hospital Course:   Chest pain, noncardiac.  May be due to anxiety and GERD.   She had cath in 2009, 2008, 2007, 2006 all with patent coronaries. Nuclear stress 2013 had no ischemia. She had two negative CTA for PE in 2006 and 2009. Cardiology was consulted  and she underwent cardiac catheterization on 8/24 which demonstrated normal coronary arteries and preserved LV function.  HIV NR.  UDS was positive for benzodiazepines and narcotics as expected.  She was advised to start protonix and continue klonopin and follow up with her primary care doctor.    HTN, BP low normal.  Her metoprolol dose was decreased to 12.5mg  daily and her lisinopril was discontinued.    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 Patient is using insulin pump at home. Her CBG is 442 on admission.  She was seen by the diabetic educator who assisted with resuming her pump to help bring her CBGs under control.  She developed hypoglycemic overnight from 8/23 to 8/24 which recurred during the morning of 8/24 probably because she was NPO for procedure.  She was advised to eat a diabetic diet and talk to her endocrinologist about her insulin settings.    Peripheral neuropathy with pain in her hands and feet.  She was continued on her neurontin.    HLD:  LDL 76 mg/dl.    Anxiety: Stable, no suicidal or homicidal ideations.  continued home medications: Klonopin, Lexapro  Tobacco abuse and Alcohol abuse.  She received counseling about the importance of quitting smoking  Hx of TIA:  Continued ASA.     Procedures:  Cardiac catheterization on 8/24  Consultations:  Cardiology  Discharge Exam: Filed Vitals:   03/26/15 0803  BP:  82/50  Pulse:   Temp:   Resp:    Filed Vitals:   03/25/15 2030 03/25/15 2121 03/26/15 0519 03/26/15 0803  BP: 107/64 97/62 81/50  82/50  Pulse: 66 65 62   Temp:  97.7 F (36.5 C) 97.8 F (36.6 C)   TempSrc:  Oral Oral   Resp: 17 18    Height:  5\' 7"  (1.702 m)    Weight:  68.629 kg (151 lb 4.8 oz)    SpO2: 97% 95% 94%     General: Adult female, NAD Cardiovascular: RRR, no mrg, 2+ radial pulses Respiratory: CTAB ABD:    NABS, soft, ND/NT MSK:  No LEE, normal tone and bulk Neuro:  Grossly intact  Discharge Instructions     Medication List    ASK your doctor about these medications        Alcohol Prep Pad 70 % Pads     aspirin EC 81 MG tablet  Take 1 tablet (81 mg total) by mouth daily.     clonazePAM 1 MG tablet  Commonly known as:  KLONOPIN  Take 1 mg by mouth 2 (two) times daily as needed for anxiety.     escitalopram 20 MG tablet  Commonly known as:  LEXAPRO  Take 20 mg by mouth at bedtime.     estradiol 1 MG tablet  Commonly known as:  ESTRACE  Take 1 mg by mouth daily.     gabapentin 300 MG capsule  Commonly known as:  NEURONTIN  Take 600 mg by mouth daily.     glucagon 1 MG injection  Commonly known as:  GLUCAGON EMERGENCY  Inject 1 mg into the muscle once as needed.     glucose blood test strip  Commonly known as:  ONETOUCH VERIO  Use to test blood sugar 4-6 times daily as instructed. Dx: E10.40     glucose blood test strip  Commonly known as:  FREESTYLE TEST STRIPS  Use to test blood sugar 4-6 times daily as instructed. Dx: E10.40     HYDROcodone-acetaminophen 10-325 MG per tablet  Commonly known as:  NORCO  Take 1 tablet by mouth every 8 (eight) hours as needed for moderate pain.     insulin lispro 100 UNIT/ML injection  Commonly known as:  HUMALOG  Use approx 80 units daily in insulin pump.     lisinopril 5 MG tablet  Commonly known as:  PRINIVIL,ZESTRIL  Take 2.5 mg by mouth daily.     multivitamin with minerals Tabs tablet  Take 1 tablet by mouth every morning.     ONETOUCH DELICA LANCETS FINE Misc  Use to test blood sugar 4-6 times daily as instructed. Dx: E10.40     pravastatin 10 MG tablet  Commonly known as:  PRAVACHOL  Take 10 mg by mouth daily.     promethazine 25 MG tablet  Commonly known as:  PHENERGAN  Take 1 tablet (25 mg total) by mouth every 6 (six) hours as needed for nausea or vomiting.     TOPROL XL 25 MG 24 hr tablet  Generic drug:   metoprolol succinate  Take 25 mg by mouth daily.          The results of significant diagnostics from this hospitalization (including imaging, microbiology, ancillary and laboratory) are listed below for reference.    Significant Diagnostic Studies: Dg Chest 2 View  03/25/2015   CLINICAL DATA:  Intermittent chest pain for several days.  EXAM: CHEST  2 VIEW  COMPARISON:  03/22/2015  FINDINGS: The cardiac silhouette, mediastinal and hilar contours are normal. The lungs are clear. No pleural effusion. The bony thorax is intact.  IMPRESSION: No acute cardiopulmonary findings.   Electronically Signed   By: Marijo Sanes M.D.   On: 03/25/2015 18:47    Microbiology: No results found for this or any previous visit (from the past 240 hour(s)).   Labs: Basic Metabolic Panel:  Recent Labs Lab 03/25/15 1838 03/26/15 0221  NA 134* 139  K 4.5 3.8  CL 100* 108  CO2 28 28  GLUCOSE 442* 125*  BUN 11 9  CREATININE 0.78 0.67  CALCIUM 9.2 8.2*   Liver Function Tests:  Recent Labs Lab 03/26/15 0221  AST 24  ALT 43  ALKPHOS 77  BILITOT 0.5  PROT 5.2*  ALBUMIN 3.0*   No results for input(s): LIPASE, AMYLASE in the last 168 hours. No results for input(s): AMMONIA in the last 168 hours. CBC:  Recent Labs Lab 03/25/15 1838 03/26/15 0221  WBC 7.0 7.0  HGB 13.8 12.0  HCT 39.6 35.5*  MCV 89.2 89.6  PLT 216 180   Cardiac Enzymes:  Recent Labs Lab 03/25/15 2143 03/26/15 0221 03/26/15 0823  TROPONINI <0.03 <0.03 <0.03   BNP: BNP (last 3 results) No results for input(s): BNP in the last 8760 hours.  ProBNP (last 3 results) No results for input(s): PROBNP in the last 8760 hours.  CBG:  Recent Labs Lab 03/25/15 2123 03/26/15 0630 03/26/15 1125 03/26/15 1214  GLUCAP 275* 67 46* 141*    Time coordinating discharge: 35 minutes  Signed:  Rielly Brunn  [facility] 03/26/2015, 2:25 PM

## 2015-03-26 NOTE — H&P (View-Only) (Signed)
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 Non-invasive testing and echo planned.  SUBJECTIVE: Still with upper L chest pain, rad down L arm and into her back. Upper L chest is tender.  OBJECTIVE Filed Vitals:   03/25/15 2030 03/25/15 2121 03/26/15 0519 03/26/15 0803  BP: 107/64 97/62 81/50  82/50  Pulse: 66 65 62   Temp:  97.7 F (36.5 C) 97.8 F (36.6 C)   TempSrc:  Oral Oral   Resp: 17 18    Height:  5\' 7"  (1.702 m)    Weight:  151 lb 4.8 oz (68.629 kg)    SpO2: 97% 95% 94%     Intake/Output Summary (Last 24 hours) at 03/26/15 6226 Last data filed at 03/26/15 0522  Gross per 24 hour  Intake      0 ml  Output    800 ml  Net   -800 ml   Filed Weights   03/25/15 2121  Weight: 151 lb 4.8 oz (68.629 kg)    PHYSICAL EXAM General: Well developed, well nourished, female in no acute distress. Head: Normocephalic, atraumatic.  Neck: Supple without bruits, JVD not elevated. Lungs:  Resp regular and unlabored, CTA. Heart: RRR, S1, S2, no S3, S4, or murmur; no rub. Abdomen: Soft, non-tender, non-distended, BS + x 4.  Extremities: No clubbing, cyanosis, edema.  Neuro: Alert and oriented X 3. Moves all extremities spontaneously. Psych: Normal affect.  LABS: CBC: Recent Labs  03/25/15 1838 03/26/15 0221  WBC 7.0 7.0  HGB 13.8 12.0  HCT 39.6 35.5*  MCV 89.2  89.6  PLT 216 180   INR: Recent Labs  03/25/15 1838  INR 3.33   Basic Metabolic Panel: Recent Labs  03/25/15 1838 03/26/15 0221  NA 134* 139  K 4.5 3.8  CL 100* 108  CO2 28 28  GLUCOSE 442* 125*  BUN 11 9  CREATININE 0.78 0.67  CALCIUM 9.2 8.2*   Liver Function Tests: Recent Labs  03/26/15 0221  AST 24  ALT 43  ALKPHOS 77  BILITOT 0.5  PROT 5.2*  ALBUMIN 3.0*   Cardiac Enzymes: Recent Labs  03/25/15 2143 03/26/15 0221  TROPONINI <0.03 <0.03    Recent Labs  03/25/15 1840  TROPIPOC 0.00   D-dimer: Recent Labs  03/25/15 2143  DDIMER <0.27   Fasting Lipid Panel: Recent Labs  03/26/15 0221  CHOL 138  HDL 50  LDLCALC 77  TRIG 53  CHOLHDL 2.8   TELE:  SR  Radiology/Studies: Dg Chest 2 View 03/25/2015   CLINICAL DATA:  Intermittent chest pain for several days.  EXAM: CHEST  2 VIEW  COMPARISON:  03/22/2015  FINDINGS: The cardiac silhouette, mediastinal and hilar contours are normal. The lungs are clear. No pleural effusion. The bony thorax is intact.  IMPRESSION: No acute cardiopulmonary findings.   Electronically Signed   By: Marijo Sanes M.D.   On: 03/25/2015 18:47   Current Medications:  . aspirin EC  325  mg Oral Daily  . escitalopram  20 mg Oral QHS  . fentaNYL (SUBLIMAZE) injection  50 mcg Intravenous Once  . gabapentin  600 mg Oral Daily  . heparin  5,000 Units Subcutaneous 3 times per day  . insulin pump   Subcutaneous 6 times per day  . lisinopril  2.5 mg Oral Daily  . metoprolol succinate  25 mg Oral Daily  . multivitamin with minerals  1 tablet Oral q morning - 10a  . nicotine  21 mg Transdermal Daily  . pantoprazole  40 mg Oral Q1200  . pravastatin  10 mg Oral q1800  . sodium chloride  3 mL Intravenous Q12H   . 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   44 year old female with type 1 DM since age 49, FH of premature CAD, HTN, HLP, smoking, postmenopausal post hysterectomy at age 51,  who presented with a typical chest pain. Her ECG and troponins are negative, however the patient is really anxious about the situation and would prefer a cath over stress test. Considering her very high risk of CAD we will schedule a cath.  Dorothy Spark 03/26/2015

## 2015-03-26 NOTE — Interval H&P Note (Signed)
History and Physical Interval Note:  03/26/2015 1:08 PM  [name] [name]  has presented today for surgery, with the diagnosis of cp  The various methods of treatment have been discussed with the patient and family. After consideration of risks, benefits and other options for treatment, the patient has consented to  Procedure(s): Left Heart Cath and Coronary Angiography (N/A) as a surgical intervention .  The patient's history has been reviewed, patient examined, no change in status, stable for surgery.  I have reviewed the patient's chart and labs.  Questions were answered to the patient's satisfaction.     Kathlyn Sacramento

## 2015-03-26 NOTE — Progress Notes (Signed)
Inpatient Diabetes Program Recommendations  AACE/ADA: New Consensus Statement on Inpatient Glycemic Control (2013)  Target Ranges:  Prepandial:   less than 140 mg/dL      Peak postprandial:   less than 180 mg/dL (1-2 hours)      Critically ill patients:  140 - 180 mg/dL    Results for [name], [name] (MRN [id number]) as of 03/26/2015 09:27  Ref. Range 03/25/2015 21:23 03/26/2015 06:30  Glucose-Capillary Latest Ref Range: 65-99 mg/dL 275 (H) 26    Admit with: CP  History: Type 1 DM, HTN, Tobacco Abuse  Home DM Meds: Insulin Pump  Current DM Orders: Insulin Pump Q4 hours    -Spoke with patient this AM.  Patient is A&O x4 and able to independently manage insulin pump.  Has extra pump supplies at bedside.  Changed set/site yesterday 08/23 evening.  Will be due for set/site change 08/25.  Discussed pump plan with patient in case she is taken for cardiac cath.  If patient has cardiac cath, will need to make sure the staff in the cath lab cover her insulin pump with a lead apron during the procedure.  Also reminded patient that we will need to check her CBGs in the hospital with the hospital CBG meter.  Patient agreeable.  -Reviewed charting responsibilities with RN caring for patient today (all CBGs with hospital meter, insulin boluses given by patient with pump, insulin pump assessment daily).  -Note patient saw her Endocrinologist Dr. Philemon Kingdom with [facility] on 02/13/15.  Minor insulin pump adjustments were made at this visit.  -Note current A1c is pending this admission.  Last A1c in May was 9.3%.  -Insulin Pump settings are as follows:  Basal Rates 12am- 0.85 unit/hr 7:30am- 0.75 unit/hr 12pm- 0.85 unit/hr 4pm- 0.75 unit/hr 6pm- 0.85 unit/hr  Total Basal per 24 hour period= 19.75 units   Carbohydrate Ratio 12am- 1 unit for every 8 grams carbohydrates consumed 1pm- 1 unit for every 7 grams carbohydrates consumed   Correction Ratio 1 unit for every 70  mg/dl above target CBG   Target CBG: 110-120 mg/dl     Will follow Wyn Quaker RN, MSN, CDE Diabetes Coordinator Inpatient Glycemic Control Team Team Pager: [phone] (8a-5p)

## 2015-03-26 NOTE — Progress Notes (Signed)
Addendum 1300:  Called by RN caring for patient.  CBG at 11:30 am was 46 mg/dl.  RN treated patient with 1 amp of D50% (patient NPO).  Repeat CBG 141 mg/dl.  Called Dr. Sheran Fava and alerted her to patient's CBG.  Dr. Sheran Fava stated she would add Dextrose to patient's IVF.   Will follow Wyn Quaker RN, MSN, CDE Diabetes Coordinator Inpatient Glycemic Control Team Team Pager: [phone] (8a-5p)

## 2015-03-26 NOTE — Progress Notes (Signed)
UR Completed Lafe Clerk Graves-Bigelow, RN,BSN [phone]  

## 2015-03-27 LAB — HEMOGLOBIN A1C
Hgb A1c MFr Bld: 9.4 % — ABNORMAL HIGH (ref 4.8–5.6)
Mean Plasma Glucose: 223 mg/dL

## 2015-03-27 MED FILL — Heparin Sodium (Porcine) 2 Unit/ML in Sodium Chloride 0.9%: INTRAMUSCULAR | Qty: 1000 | Status: AC

## 2015-03-27 MED FILL — Lidocaine HCl Local Preservative Free (PF) Inj 1%: INTRAMUSCULAR | Qty: 30 | Status: AC

## 2015-04-01 ENCOUNTER — Encounter: Payer: Self-pay | Admitting: Internal Medicine

## 2015-04-01 ENCOUNTER — Ambulatory Visit (INDEPENDENT_AMBULATORY_CARE_PROVIDER_SITE_OTHER): Payer: Medicare Other | Admitting: Internal Medicine

## 2015-04-01 VITALS — BP 104/60 | HR 69 | Temp 98.4°F | Resp 12 | Wt 152.8 lb

## 2015-04-01 DIAGNOSIS — E1040 Type 1 diabetes mellitus with diabetic neuropathy, unspecified: Secondary | ICD-10-CM | POA: Diagnosis not present

## 2015-04-01 DIAGNOSIS — E1065 Type 1 diabetes mellitus with hyperglycemia: Principal | ICD-10-CM

## 2015-04-01 DIAGNOSIS — E1042 Type 1 diabetes mellitus with diabetic polyneuropathy: Secondary | ICD-10-CM

## 2015-04-01 NOTE — Progress Notes (Signed)
Patient ID: [name], female   DOB: 13-Sep-1970, 44 y.o.   MRN: [id number]  HPI: [name] is a 44 y.o.-year-old femalele, returning for f/u for DM1, dx in 15 (44 y/o), uncontrolled, with complications (cerebro-vascular ds - h/o TIA 0/2016, PN). Last visit 1.5 mo ago.  She was hospitalized for CP last week >> cath: no CAD. He saw cardiology >> stopped Lisinopril, started Toprol XL 25 mg daily. No more CP/SOB now. Trying to quit smoking.   Last hemoglobin A1c was: Lab Results  Component Value Date   HGBA1C 9.4* 03/26/2015   HGBA1C 9.3* 12/06/2014   HGBA1C 11.5* 08/17/2014   She has been on an insulin pump before >> sugars higher (2000). She is interested to get back on this. Omnipod - got it 12/25/2014. She still has pbs with [facility]: supplies >> will need to change to Medtronic pump.  Pt was on a regimen of: - Lantus to 26 units in am - Humalog dose as follows: - 10 units before a smaller meal - 12 units before a regular meal - 14 units before a larger meal - SSI Humalog:  150-200: + 1 unit 201-250: + 2 units 251-300: + 3 units 301-350: + 4 units >350: + 5 units  Now on the insulin pump: - Pump settings:  - basal rates: 12 am: 0.85 units/h 7:30: 0.75 12 pm: 0.85 4 pm: 0.75 6  pm: 0.85 - ICR:   12 am: 8   1 pm: 6 >> 7 - target: 100-120 >> 110-120 - ISF: 70 - Insulin on Board: 4h - bolus wizard: on  TDD from basal insulin: 19.7 units TDD from bolus insulin: 5-10 units! - extended bolusing: not using - changes infusion site: q3 days - Meter: Omnipod meter  Pt checks her sugars 4 a day and they are higher:  - am: 126-323 >> 117, 195-300 >> 52-495 >> 106-258, 303 - 2h after b'fast: 48, 65, 189-332 >> n/c >> 64-150, 218 >> n/c - before lunch: 122, 191-266 >> 49-144 >> 153-265 >> 117-266, 368, 480 - 2h after lunch: 271, 284 >> n/c >> n/c - before dinner: 305, 341 >> 44, 71-120, 193, 252 >> 125-341 >> 99-241, 450, 501x1 - 2h after dinner: n/c >>  48-468 >> 113, 231 - bedtime: n/c >> 52, 84, 114-150, 200 x2 >> 109-445 >> same - nighttime: 67, 75, 307 >> n/c No lows. Lowest sugar was 27 in 07/2014,  Recently: 55x1; she has no hypoglycemia awareness! Highest sugar was 514 >> 300s >> 400s >> 500x1  No h/o DKA or hypoglycemia admissions. Had a hyperglycemia ED visit 12/21/2014.  Pt's meals are: - Breakfast: toast + PB and bacon - Lunch: chicken salad + fruit - may skip - Dinner: veggies + meat - Snacks: 0 Drinks diet SunDrop sodas.   For exercise, walks every other day.  - no CKD, last BUN/creatinine:  Lab Results  Component Value Date   BUN 9 03/26/2015   CREATININE 0.67 03/26/2015   - last set of lipids: Lab Results  Component Value Date   CHOL 133 03/26/2015   HDL 49 03/26/2015   LDLCALC 76 03/26/2015   TRIG 42 03/26/2015   CHOLHDL 2.7 03/26/2015  On Zocor. - last eye exam was in 09/16/2014. No DR.  - + numbness and tingling in her feet. Stopped Gabapentin b/c SEs >> restarted now at lower dose.  She also has a h/o HL. No h/o hypothyroidism.  ROS: Constitutional: no weight gain,  no fatigue, no subjective hyperthermia/hypothermia, + poor sleep Eyes: no blurry vision, no xerophthalmia ENT: no sore throat, no nodules palpated in throat, no dysphagia/odynophagia, no hoarseness Cardiovascular: no CP/SOB/palpitations/leg swelling Respiratory: no cough/SOB Gastrointestinal: no N/V/D/C Musculoskeletal: no muscle/joint aches Skin: no rashes, + easy bruising Neurological: no tremors/numbness/tingling/no dizziness. She is not stumbling or falling as much as before.  I reviewed pt's medications, allergies, PMH, social hx, family hx, and changes were documented in the history of present illness. Otherwise, unchanged from my initial visit note:  Past Medical History  Diagnosis Date  . Diabetes mellitus without complication   . Neuropathy   . Depression   . GERD (gastroesophageal reflux disease)   . Hiatal hernia   .  Migraine headache   . IBS (irritable bowel syndrome)   . Vertigo   . Hypertension   . Hyperlipidemia   . Anxiety   . Abdominal wall mass of right flank 04/11/13  . Abrasion of leg with infection 02/18/14  . Benign paroxysmal vertigo 09/04/13  . Bronchitis, acute 02/28/14  . Bunion of great toe 04/11/13  . Cervicalgia 06/13/14  . Dorsalgia 05/17/14  . Dysuria 03/27/14  . Gastroenteritis 01/28/14  . Gastro-esophageal reflux disease without esophagitis 01/28/14  . Pain in thoracic spine 06/13/14  . 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 Peripheral neuropathy 11/05/13  . Subungual hematoma of digit of hand   . Tendonitis 03/07/13  . Tobacco use   . TIA (transient ischemic attack)   . PE (physical exam), annual    Past Surgical History  Procedure Laterality Date  . Cesarean section    . Tubal ligation    . Abdominal hysterectomy    . Appendectomy    . Cholecystectomy    . Cyst excision      right breast  . Cardiac catheterization   last in 2009    x 4, normal coronary arteries  . Cardiac catheterization N/A 03/26/2015    Procedure: Left Heart Cath and Coronary Angiography;  Surgeon: Wellington Hampshire, MD;  Location: [facility];  Service: Cardiovascular;  Laterality: N/A;   History   Social History  . Marital Status: Divorced    Spouse Name: N/A  . Number of Children: 1   Occupational History  . disabled   Social History Main Topics  . Smoking status: Current Every Day Smoker -- 0.50 packs/day for 11 years  . Smokeless tobacco: Never Used     Comment: patient is aware that she needs to quit smoking  . Alcohol Use: No  . Drug Use: No   Current Outpatient Prescriptions on File Prior to Visit  Medication Sig Dispense Refill  . Alcohol Swabs (ALCOHOL PREP PAD) 70 % PADS   2  . aspirin EC 81 MG tablet Take 1 tablet (81 mg total) by mouth daily.    . clonazePAM (KLONOPIN) 1 MG tablet Take 1 mg by mouth 2 (two) times daily as needed for anxiety.     Marland Kitchen escitalopram  (LEXAPRO) 20 MG tablet Take 20 mg by mouth at bedtime.     Marland Kitchen estradiol (ESTRACE) 1 MG tablet Take 1 mg by mouth daily.    Marland Kitchen gabapentin (NEURONTIN) 300 MG capsule Take 600 mg by mouth daily.     Marland Kitchen glucagon (GLUCAGON EMERGENCY) 1 MG injection Inject 1 mg into the muscle once as needed. (Patient taking differently: Inject 1 mg into the muscle once as needed (for low blood sugar). ) 1 each 1  . glucose  blood (FREESTYLE TEST STRIPS) test strip Use to test blood sugar 4-6 times daily as instructed. Dx: E10.40 200 each 11  . glucose blood (ONETOUCH VERIO) test strip Use to test blood sugar 4-6 times daily as instructed. Dx: E10.40 550 each 3  . HYDROcodone-acetaminophen (NORCO) 10-325 MG per tablet Take 1 tablet by mouth every 8 (eight) hours as needed for moderate pain.     Marland Kitchen insulin lispro (HUMALOG) 100 UNIT/ML injection Use approx 80 units daily in insulin pump. (Patient taking differently: Inject 70 Units into the skin continuous. in insulin pump) 20 mL 1  . metoprolol succinate (TOPROL XL) 25 MG 24 hr tablet Take 0.5 tablets (12.5 mg total) by mouth daily. 30 tablet 0  . Multiple Vitamin (MULTIVITAMIN WITH MINERALS) TABS Take 1 tablet by mouth every morning.    Glory Rosebush DELICA LANCETS FINE MISC Use to test blood sugar 4-6 times daily as instructed. Dx: E10.40 600 each 3  . pantoprazole (PROTONIX) 40 MG tablet Take 1 tablet (40 mg total) by mouth daily at 12 noon. 30 tablet 0  . pravastatin (PRAVACHOL) 10 MG tablet Take 10 mg by mouth daily.     No current facility-administered medications on file prior to visit.   Allergies  Allergen Reactions  . Zocor [Simvastatin] Other (See Comments)    Muscle aches and pains  . Clindamycin/Lincomycin Rash  . Codeine Rash  . Penicillins Rash  . Sulfa Antibiotics Rash        Family History  Problem Relation Age of Onset  . Stroke Mother 34  . Heart attack Mother 32  . Cancer Sister     colon  . Heart failure Maternal Grandmother 65  . Cancer  Maternal Grandfather     prostate  . Heart failure Paternal Grandmother 45  . Heart failure Paternal Grandfather 60   PE: BP 104/60 mmHg  Pulse 69  Temp(Src) 98.4 F (36.9 C) (Oral)  Resp 12  Wt 152 lb 12.8 oz (69.31 kg)  SpO2 96% Body mass index is 23.93 kg/(m^2). Wt Readings from Last 3 Encounters:  04/01/15 152 lb 12.8 oz (69.31 kg)  03/25/15 151 lb 4.8 oz (68.629 kg)  02/13/15 148 lb 6.4 oz (67.314 kg)   Constitutional: normal weight, in NAD Eyes: PERRLA, EOMI, no exophthalmos ENT: moist mucous membranes, no thyromegaly, no cervical lymphadenopathy Cardiovascular: RRR, No MRG Respiratory: CTA B Gastrointestinal: abdomen soft, NT, ND, BS+ Musculoskeletal: no deformities, strength intact in all 4 Skin: moist, warm, no rashes Neurological: no tremor with outstretched hands, DTR normal in all 4  ASSESSMENT: 1. DM1, insulin-dependent, uncontrolled, with complications - cerebro-vascular ds - h/o TIA 0/2016 - PN Sees cardiology >> Dr, Sharlotte Alamo [facility] - [facility]  PLAN:  1. Patient with long-standing, uncontrolled DM1, on Omnipod insulin pump now, with higher sugars throughout the day, as she is bolusing even less than before. Reviewing the pump download, she is not counting her carbs correctly. We reviewed some of her meals and the carbs that she entered in the pump, and she is entering at least 50% less than the actual amount. I will not change the settings for now, but I gave her a handout about carb counting, I advised her how to actually calculated them, and I will also refer her to nutrition for a carb counting refresher. - Will also need to change from Omnipod to a Medtronic pump, since her pump supplies provider will not cover an Omnipod. I discussed with Jettie Booze, the diabetes educator, and she  got in touch with the [employer] rep >> will have the pump available for her tomorrow (a loaner pump for now) - he was advised to return tomorrow for attaching the Medtronic  pump. - I suggested to:   Patient Instructions  Please continue: - basal rates: 12 am: 0.85 units/h 7:30: 0.75 12 pm: 0.85 4 pm: 0.75 6  pm: 0.85 - ICR:   12 am: 8   1 pm: 7 - target: 110-120 - ISF: 70 - Insulin on Board: 4h - bolus wizard: on  Please return in 1.5 months.  Please schedule an appt with Antonieta Iba with nutrition - carb counting.  - continue checking sugars at different times of the day - check 4 times a day, rotating checks - advised for yearly eye exams >> she is up-to-date  - no signs of other autoimmune disorders  - check HbA1c at next visit - Return to clinic in 1.5 mo with sugar log   - time spent with the patient: 40 min, of which >50% was spent in reviewing her pump downloads, discussing her hypo- and hyper-glycemic episodes, reviewing previous labs and pump settings, developing a plan to avoid hypo- and hyper-glycemia, and coordinating her care.

## 2015-04-01 NOTE — Patient Instructions (Signed)
Please continue: - basal rates: 12 am: 0.85 units/h 7:30: 0.75 12 pm: 0.85 4 pm: 0.75 6  pm: 0.85 - ICR:   12 am: 8   1 pm: 7 - target: 110-120 - ISF: 70 - Insulin on Board: 4h - bolus wizard: on  Please return in 1.5 months.  Please schedule an appt with Antonieta Iba with nutrition - carb counting.  Basic Carbohydrate Counting for Diabetes Mellitus Carbohydrate counting is a method for keeping track of the amount of carbohydrates you eat. Eating carbohydrates naturally increases the level of sugar (glucose) in your blood, so it is important for you to know the amount that is okay for you to have in every meal. Carbohydrate counting helps keep the level of glucose in your blood within normal limits. The amount of carbohydrates allowed is different for every person. A dietitian can help you calculate the amount that is right for you. Once you know the amount of carbohydrates you can have, you can count the carbohydrates in the foods you want to eat. Carbohydrates are found in the following foods:  Grains, such as breads and cereals.  Dried beans and soy products.  Starchy vegetables, such as potatoes, peas, and corn.  Fruit and fruit juices.  Milk and yogurt.  Sweets and snack foods, such as cake, cookies, candy, chips, soft drinks, and fruit drinks. CARBOHYDRATE COUNTING There are two ways to count the carbohydrates in your food. You can use either of the methods or a combination of both. Reading the "Nutrition Facts" on [place] The "Nutrition Facts" is an area that is included on the labels of almost all packaged food and beverages in the [place]. It includes the serving size of that food or beverage and information about the nutrients in each serving of the food, including the grams (g) of carbohydrate per serving.  Decide the number of servings of this food or beverage that you will be able to eat or drink. Multiply that number of servings by the number of grams of  carbohydrate that is listed on the label for that serving. The total will be the amount of carbohydrates you will be having when you eat or drink this food or beverage. Learning Standard Serving Sizes of Food When you eat food that is not packaged or does not include "Nutrition Facts" on the label, you need to measure the servings in order to count the amount of carbohydrates.A serving of most carbohydrate-rich foods contains about 15 g of carbohydrates. The following list includes serving sizes of carbohydrate-rich foods that provide 15 g ofcarbohydrate per serving:   1 slice of bread (1 oz) or 1 six-inch tortilla.    of a hamburger bun or English muffin.  4-6 crackers.   cup unsweetened dry cereal.    cup hot cereal.   cup rice or pasta.    cup mashed potatoes or  of a large baked potato.  1 cup fresh fruit or one small piece of fruit.    cup canned or frozen fruit or fruit juice.  1 cup milk.   cup plain fat-free yogurt or yogurt sweetened with artificial sweeteners.   cup cooked dried beans or starchy vegetable, such as peas, corn, or potatoes.  Decide the number of standard-size servings that you will eat. Multiply that number of servings by 15 (the grams of carbohydrates in that serving). For example, if you eat 2 cups of strawberries, you will have eaten 2 servings and 30 g of carbohydrates (2  servings x 15 g = 30 g). For foods such as soups and casseroles, in which more than one food is mixed in, you will need to count the carbohydrates in each food that is included. EXAMPLE OF CARBOHYDRATE COUNTING Sample Dinner  3 oz chicken breast.   cup of brown rice.   cup of corn.  1 cup milk.   1 cup strawberries with sugar-free whipped topping.  Carbohydrate Calculation Step 1: Identify the foods that contain carbohydrates:   Rice.   Corn.   Milk.   Strawberries. Step 2:Calculate the number of servings eaten of each:   2 servings of rice.    1 serving of corn.   1 serving of milk.   1 serving of strawberries. Step 3: Multiply each of those number of servings by 15 g:   2 servings of rice x 15 g = 30 g.   1 serving of corn x 15 g = 15 g.   1 serving of milk x 15 g = 15 g.   1 serving of strawberries x 15 g = 15 g. Step 4: Add together all of the amounts to find the total grams of carbohydrates eaten: 30 g + 15 g + 15 g + 15 g = 75 g. Document Released: 07/19/2005 Document Revised: 12/03/2013 Document Reviewed: 06/15/2013 [facility] Patient Information 2015 [place], [place]. This information is not intended to replace advice given to you by your health care provider. Make sure you discuss any questions you have with your health care provider.

## 2015-04-02 ENCOUNTER — Encounter: Payer: Medicare Other | Attending: Internal Medicine | Admitting: Nutrition

## 2015-04-02 DIAGNOSIS — Z794 Long term (current) use of insulin: Secondary | ICD-10-CM | POA: Insufficient documentation

## 2015-04-02 DIAGNOSIS — Z713 Dietary counseling and surveillance: Secondary | ICD-10-CM | POA: Insufficient documentation

## 2015-04-02 DIAGNOSIS — E1040 Type 1 diabetes mellitus with diabetic neuropathy, unspecified: Secondary | ICD-10-CM | POA: Insufficient documentation

## 2015-04-02 NOTE — Progress Notes (Signed)
[name] was instructed on the use of the Medtronic 522 insulin pump.  This was a loaner to the office from [employer] to use until the paperwork gets processed and the patient gets her new pump.   She had been on a Medtronic pump 3 years ago, but said she needed a review of how to do everything.  She was shown how to give a bolus, how to fill a cartridge, how to attach and insert a Mio 28mm infusion set.  She re demonstrated how to fill a cartridge correctly and attached a mio, primed it, and inserted it into her left abdominal area with very little assistance from me.   Settings were put into the pump per Dr. Chrissie Noa orders:  Basal rate: MN: 0.85,  7:30AM: 0.75,  12PM: 0.85, 4PM: 0.75,  6PM: 0.85.  I/C ratio: 12AM: 8,  1PM: 7,  ISF: 70, target 110-120, IOB: 4hr.  Pt. Was given a manual to use as a reference if any questions.  She had no final questions

## 2015-04-02 NOTE — Patient Instructions (Signed)
Change cartridge and infusion set every 3 days. Read manual Call if questions.

## 2015-04-03 ENCOUNTER — Telehealth: Payer: Self-pay | Admitting: Internal Medicine

## 2015-04-03 ENCOUNTER — Other Ambulatory Visit: Payer: Self-pay | Admitting: *Deleted

## 2015-04-03 MED ORDER — INSULIN LISPRO 100 UNIT/ML (KWIKPEN): 18.0000 [IU] | PEN_INJECTOR | Freq: Three times a day (TID) | SUBCUTANEOUS | Status: DC

## 2015-04-03 MED ORDER — GLUCOSE BLOOD VI STRP: ORAL_STRIP | Status: DC

## 2015-04-03 NOTE — Telephone Encounter (Signed)
Pt calling regarding the new pump she got yesterday, it is messing up, she is asking if she can take it off and use the shots to hold her over until the new pump comes in

## 2015-04-03 NOTE — Telephone Encounter (Signed)
She need the humalog flexpen called into [facility] [place]

## 2015-04-08 ENCOUNTER — Encounter: Payer: Self-pay | Admitting: *Deleted

## 2015-04-08 ENCOUNTER — Other Ambulatory Visit: Payer: Self-pay | Admitting: Internal Medicine

## 2015-04-08 ENCOUNTER — Encounter: Payer: Self-pay | Admitting: Internal Medicine

## 2015-04-08 ENCOUNTER — Telehealth: Payer: Self-pay | Admitting: *Deleted

## 2015-04-08 ENCOUNTER — Telehealth: Payer: Self-pay | Admitting: Internal Medicine

## 2015-04-08 DIAGNOSIS — E1042 Type 1 diabetes mellitus with diabetic polyneuropathy: Secondary | ICD-10-CM

## 2015-04-08 DIAGNOSIS — E1065 Type 1 diabetes mellitus with hyperglycemia: Secondary | ICD-10-CM

## 2015-04-08 MED ORDER — INSULIN GLARGINE 100 UNIT/ML SOLOSTAR PEN: 24.0000 [IU] | PEN_INJECTOR | Freq: Every day | SUBCUTANEOUS | Status: DC

## 2015-04-08 NOTE — Progress Notes (Signed)
Called patient back after she called [place] which sugars in the 500s. She mentions that she was tried on the low nor pump last week, however this gave her errors. She then started insulin injections, but apparently no Lantus. Subsequently, sugars in the morning are in the 500s. At the time of the phone call, her sugars decreased to 358. She is now using 18-20 units 3 times a day of Humalog.  I advised the patient to start using the following regimen: - Add Lantus 20 units in a.m. and increase to 24 units in 3 days if sugars in the morning are still above goal - Decreased Humalog to 10 units with a small meal, 12 units with a regular meal and 14 units with a large meal. - Add the following Humalog sliding scale: 120-170: +1 unit 171-220: +2 units 221-270: +3 units >270: +4 units  I advised her to call us in 2-3 days with her sugars.

## 2015-04-08 NOTE — Progress Notes (Signed)
Phoned patient to see how she was doing, since starting on Lantus.  There was no answer.  Not sure what happened to the pump she was given by the [employer] rep., to use until hers comes in.  She was told to call her.  I gave her her telephone number to call when we started her on the pump last week, and am not sure why she didn't call her.  I left her number on the voice mail, and then told her to call me before 5PM tonight to let me know if blood sugars are coming down.  Also told her to call me tomorrow morning, if she does not get this message until after 5PM today.

## 2015-04-08 NOTE — Telephone Encounter (Signed)
Let's order a C peptide and fasting glucose, then. Thank you!

## 2015-04-08 NOTE — Telephone Encounter (Signed)
Looking through pt's record/chart, I cannot find a c-peptide or a fasting glucose. Medtronic is requiring this. Please advise.

## 2015-04-08 NOTE — Telephone Encounter (Signed)
Patient stated that B/S is running high it been in the 500 range this morning it was 498, please advise

## 2015-04-08 NOTE — Telephone Encounter (Signed)
See note on other phone message dated 03/08/15

## 2015-04-08 NOTE — Telephone Encounter (Signed)
Please read message below and advise.  

## 2015-04-08 NOTE — Telephone Encounter (Signed)
[name], can you please give her a call this morning to see what is going on?

## 2015-04-10 ENCOUNTER — Other Ambulatory Visit: Payer: Self-pay | Admitting: *Deleted

## 2015-04-10 DIAGNOSIS — E1042 Type 1 diabetes mellitus with diabetic polyneuropathy: Secondary | ICD-10-CM

## 2015-04-10 DIAGNOSIS — E1065 Type 1 diabetes mellitus with hyperglycemia: Secondary | ICD-10-CM

## 2015-04-10 NOTE — Telephone Encounter (Signed)
Finally was able to reach pt. Advised her per Dr Arman Filter message. She will have labs done tomorrow in [place] at [facility], if possible.

## 2015-04-15 ENCOUNTER — Ambulatory Visit: Payer: Medicare Other | Admitting: Dietician

## 2015-05-15 ENCOUNTER — Ambulatory Visit: Payer: Medicare Other | Admitting: Internal Medicine

## 2015-06-18 ENCOUNTER — Encounter (HOSPITAL_COMMUNITY): Payer: Self-pay

## 2015-06-18 ENCOUNTER — Emergency Department (HOSPITAL_COMMUNITY): Payer: Medicare Other

## 2015-06-18 ENCOUNTER — Emergency Department (HOSPITAL_COMMUNITY)
Admission: EM | Admit: 2015-06-18 | Discharge: 2015-06-18 | Disposition: A | Discharge status: Home / Self Care | Payer: Medicare Other | Attending: Emergency Medicine | Admitting: Emergency Medicine

## 2015-06-18 DIAGNOSIS — F17200 Nicotine dependence, unspecified, uncomplicated: Secondary | ICD-10-CM | POA: Diagnosis not present

## 2015-06-18 DIAGNOSIS — E119 Type 2 diabetes mellitus without complications: Secondary | ICD-10-CM | POA: Insufficient documentation

## 2015-06-18 DIAGNOSIS — Z8673 Personal history of transient ischemic attack (TIA), and cerebral infarction without residual deficits: Secondary | ICD-10-CM | POA: Insufficient documentation

## 2015-06-18 DIAGNOSIS — G629 Polyneuropathy, unspecified: Secondary | ICD-10-CM | POA: Diagnosis not present

## 2015-06-18 DIAGNOSIS — Z872 Personal history of diseases of the skin and subcutaneous tissue: Secondary | ICD-10-CM | POA: Diagnosis not present

## 2015-06-18 DIAGNOSIS — S93401A Sprain of unspecified ligament of right ankle, initial encounter: Secondary | ICD-10-CM | POA: Diagnosis not present

## 2015-06-18 DIAGNOSIS — K589 Irritable bowel syndrome without diarrhea: Secondary | ICD-10-CM | POA: Diagnosis not present

## 2015-06-18 DIAGNOSIS — Y9389 Activity, other specified: Secondary | ICD-10-CM | POA: Diagnosis not present

## 2015-06-18 DIAGNOSIS — Z79899 Other long term (current) drug therapy: Secondary | ICD-10-CM | POA: Diagnosis not present

## 2015-06-18 DIAGNOSIS — F4001 Agoraphobia with panic disorder: Secondary | ICD-10-CM | POA: Insufficient documentation

## 2015-06-18 DIAGNOSIS — S99911A Unspecified injury of right ankle, initial encounter: Secondary | ICD-10-CM | POA: Diagnosis present

## 2015-06-18 DIAGNOSIS — Y998 Other external cause status: Secondary | ICD-10-CM | POA: Diagnosis not present

## 2015-06-18 DIAGNOSIS — Z791 Long term (current) use of non-steroidal anti-inflammatories (NSAID): Secondary | ICD-10-CM | POA: Diagnosis not present

## 2015-06-18 DIAGNOSIS — E785 Hyperlipidemia, unspecified: Secondary | ICD-10-CM | POA: Diagnosis not present

## 2015-06-18 DIAGNOSIS — G43909 Migraine, unspecified, not intractable, without status migrainosus: Secondary | ICD-10-CM | POA: Insufficient documentation

## 2015-06-18 DIAGNOSIS — Z7982 Long term (current) use of aspirin: Secondary | ICD-10-CM | POA: Insufficient documentation

## 2015-06-18 DIAGNOSIS — I10 Essential (primary) hypertension: Secondary | ICD-10-CM | POA: Diagnosis not present

## 2015-06-18 DIAGNOSIS — K219 Gastro-esophageal reflux disease without esophagitis: Secondary | ICD-10-CM | POA: Diagnosis not present

## 2015-06-18 DIAGNOSIS — F329 Major depressive disorder, single episode, unspecified: Secondary | ICD-10-CM | POA: Diagnosis not present

## 2015-06-18 DIAGNOSIS — Z793 Long term (current) use of hormonal contraceptives: Secondary | ICD-10-CM | POA: Diagnosis not present

## 2015-06-18 DIAGNOSIS — W1839XA Other fall on same level, initial encounter: Secondary | ICD-10-CM | POA: Insufficient documentation

## 2015-06-18 DIAGNOSIS — Z880 Allergy status to penicillin: Secondary | ICD-10-CM | POA: Diagnosis not present

## 2015-06-18 DIAGNOSIS — Y9289 Other specified places as the place of occurrence of the external cause: Secondary | ICD-10-CM | POA: Diagnosis not present

## 2015-06-18 DIAGNOSIS — Z794 Long term (current) use of insulin: Secondary | ICD-10-CM | POA: Diagnosis not present

## 2015-06-18 MED ORDER — NAPROXEN 500 MG PO TABS: 500.0000 mg | ORAL_TABLET | Freq: Two times a day (BID) | ORAL | Status: DC

## 2015-06-18 MED ORDER — HYDROCODONE-ACETAMINOPHEN 5-325 MG PO TABS
1.0000 | ORAL_TABLET | Freq: Once | ORAL | Status: AC
  Administered 2015-06-18: 1 via ORAL
  Filled 2015-06-18: qty 1

## 2015-06-18 MED ORDER — HYDROCODONE-ACETAMINOPHEN 5-325 MG PO TABS: ORAL_TABLET | ORAL | Status: DC

## 2015-06-18 NOTE — Discharge Instructions (Signed)
Ankle Sprain °An ankle sprain is an injury to the strong, fibrous tissues (ligaments) that hold your ankle bones together.  °HOME CARE  °· Put ice on your ankle for 1-2 days or as told by your doctor. °¨ Put ice in a plastic bag. °¨ Place a towel between your skin and the bag. °¨ Leave the ice on for 15-20 minutes at a time, every 2 hours while you are awake. °· Only take medicine as told by your doctor. °· Raise (elevate) your injured ankle above the level of your heart as much as possible for 2-3 days. °· Use crutches if your doctor tells you to. Slowly put your own weight on the affected ankle. Use the crutches until you can walk without pain. °· If you have a plaster splint: °¨ Do not rest it on anything harder than a pillow for 24 hours. °¨ Do not put weight on it. °¨ Do not get it wet. °¨ Take it off to shower or bathe. °· If given, use an elastic wrap or support stocking for support. Take the wrap off if your toes lose feeling (numb), tingle, or turn cold or blue. °· If you have an air splint: °¨ Add or let out air to make it comfortable. °¨ Take it off at night and to shower and bathe. °¨ Wiggle your toes and move your ankle up and down often while you are wearing it. °GET HELP IF: °· You have rapidly increasing bruising or puffiness (swelling). °· Your toes feel very cold. °· You lose feeling in your foot. °· Your medicine does not help your pain. °GET HELP RIGHT AWAY IF:  °· Your toes lose feeling (numb) or turn blue. °· You have severe pain that is increasing. °MAKE SURE YOU:  °· Understand these instructions. °· Will watch your condition. °· Will get help right away if you are not doing well or get worse. °  °This information is not intended to replace advice given to you by your health care provider. Make sure you discuss any questions you have with your health care provider. °  °Document Released: 01/05/2008 Document Revised: 08/09/2014 Document Reviewed: 01/31/2012 °Elsevier Interactive Patient  Education ©2016 [employer] ° °

## 2015-06-18 NOTE — ED Notes (Signed)
Patient states she fell 1 week ago c/o right foot pain. Tonight she states she re-injured right foot.

## 2015-06-18 NOTE — ED Notes (Signed)
Pt declined crutches.

## 2015-06-21 NOTE — ED Provider Notes (Signed)
CSN: [id number]     Arrival date & time 06/18/15  1926 History   First MD Initiated Contact with Patient 06/18/15 2004     Chief Complaint  Patient presents with  . Foot Pain     (Consider location/radiation/quality/duration/timing/severity/associated sxs/prior Treatment) HPI   [name] is a 44 y.o. female who presents to the Emergency Department complaining of right foot and ankle pain for one week .  She states that she fell and then re-injured the same area again shortly before ED arrival.  Reports pain and swelling to her ankle and hind foot.  Pain is worse with weight bearing.  She denies redness, numbness or weakness, and pain proximal to the ankle.  She has not taken any medications for symptom relief.     Past Medical History  Diagnosis Date  . Diabetes mellitus without complication ([place])   . Neuropathy ([place])   . Depression   . GERD (gastroesophageal reflux disease)   . Hiatal hernia   . Migraine headache   . IBS (irritable bowel syndrome)   . Vertigo   . Hypertension   . Hyperlipidemia   . Anxiety   . Abdominal wall mass of right flank 04/11/13  . Abrasion of leg with infection 02/18/14  . Benign paroxysmal vertigo 09/04/13  . Bronchitis, acute 02/28/14  . Bunion of great toe 04/11/13  . Cervicalgia 06/13/14  . Dorsalgia 05/17/14  . Dysuria 03/27/14  . Gastroenteritis 01/28/14  . Gastro-esophageal reflux disease without esophagitis 01/28/14  . Pain in thoracic spine 06/13/14  . 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 Peripheral neuropathy ([place]) 11/05/13  . Subungual hematoma of digit of hand   . Tendonitis 03/07/13  . Tobacco use   . TIA (transient ischemic attack)   . PE (physical exam), annual    Past Surgical History  Procedure Laterality Date  . Cesarean section    . Tubal ligation    . Abdominal hysterectomy    . Appendectomy    . Cholecystectomy    . Cyst excision      right breast  . Cardiac catheterization   last in 2009    x 4, normal  coronary arteries  . Cardiac catheterization N/A 03/26/2015    Procedure: Left Heart Cath and Coronary Angiography;  Surgeon: Wellington Hampshire, MD;  Location: [facility];  Service: Cardiovascular;  Laterality: N/A;   Family History  Problem Relation Age of Onset  . Stroke Mother 42  . Heart attack Mother 28  . Cancer Sister     colon  . Heart failure Maternal Grandmother 65  . Cancer Maternal Grandfather     prostate  . Heart failure Paternal Grandmother 40  . Heart failure Paternal Grandfather 60   Social History  Substance Use Topics  . Smoking status: Current Every Day Smoker -- 0.50 packs/day for 11 years  . Smokeless tobacco: Never Used     Comment: patient is aware that she needs to quit smoking  . Alcohol Use: No   OB History    No data available     Review of Systems  Constitutional: Negative for fever and chills.  Genitourinary: Negative for dysuria and difficulty urinating.  Musculoskeletal: Positive for joint swelling and arthralgias.  Skin: Negative for color change and wound.  All other systems reviewed and are negative.     Allergies  Zocor; Clindamycin/lincomycin; Codeine; Penicillins; and Sulfa antibiotics  Home Medications   Prior to Admission medications   Medication Sig  Start Date End Date Taking? Authorizing Provider  aspirin EC 81 MG tablet Take 1 tablet (81 mg total) by mouth daily. 08/18/14  Yes Marianne L York, PA-C  clonazePAM (KLONOPIN) 1 MG tablet Take 1 mg by mouth 2 (two) times daily as needed for anxiety.    Yes Historical Provider, MD  estradiol (ESTRACE) 1 MG tablet Take 1 mg by mouth daily.   Yes Historical Provider, MD  gabapentin (NEURONTIN) 300 MG capsule Take 600 mg by mouth daily. 05/27/15  Yes Historical Provider, MD  Insulin Glargine (LANTUS SOLOSTAR) 100 UNIT/ML Solostar Pen Inject 24 Units into the skin daily before breakfast. 04/08/15  Yes Philemon Kingdom, MD  insulin lispro (HUMALOG KWIKPEN) 100 UNIT/ML KiwkPen Inject  0.18-0.2 mLs (18-20 Units total) into the skin 3 (three) times daily. 04/03/15  Yes Philemon Kingdom, MD  lisinopril (PRINIVIL,ZESTRIL) 5 MG tablet Take 5 mg by mouth daily. 06/10/15  Yes Historical Provider, MD  meloxicam (MOBIC) 15 MG tablet Take 15 mg by mouth daily. 05/27/15  Yes Historical Provider, MD  metoprolol succinate (TOPROL XL) 25 MG 24 hr tablet Take 0.5 tablets (12.5 mg total) by mouth daily. 03/27/15  Yes Janece Canterbury, MD  Multiple Vitamin (MULTIVITAMIN WITH MINERALS) TABS Take 1 tablet by mouth every morning.   Yes Historical Provider, MD  PARoxetine (PAXIL) 20 MG tablet Take 20 mg by mouth daily. 05/27/15  Yes Historical Provider, MD  pravastatin (PRAVACHOL) 10 MG tablet Take 10 mg by mouth at bedtime. 05/29/15  Yes Historical Provider, MD  Alcohol Swabs (ALCOHOL PREP PAD) 70 % PADS  06/07/14   Historical Provider, MD  glucagon (GLUCAGON EMERGENCY) 1 MG injection Inject 1 mg into the muscle once as needed. Patient taking differently: Inject 1 mg into the muscle once as needed (for low blood sugar).  11/04/14   Philemon Kingdom, MD  glucose blood (BAYER CONTOUR NEXT TEST) test strip Use to test blood sugar 4-6 times daily as instructed. Dx: E10.40 04/03/15   Philemon Kingdom, MD  HYDROcodone-acetaminophen (NORCO/VICODIN) 5-325 MG tablet Take one tab po q 4-6 hrs prn pain 06/18/15   Mahkai Fangman, PA-C  insulin lispro (HUMALOG) 100 UNIT/ML injection Use approx 80 units daily in insulin pump. Patient not taking: Reported on 06/18/2015 01/02/15   Philemon Kingdom, MD  naproxen (NAPROSYN) 500 MG tablet Take 1 tablet (500 mg total) by mouth 2 (two) times daily with a meal. 06/18/15   Harper Vandervoort, PA-C  ONETOUCH DELICA LANCETS FINE MISC Use to test blood sugar 4-6 times daily as instructed. Dx: E10.40 11/07/14   Philemon Kingdom, MD  pantoprazole (PROTONIX) 40 MG tablet Take 1 tablet (40 mg total) by mouth daily at 12 noon. Patient not taking: Reported on 06/18/2015 03/26/15   Janece Canterbury,  MD   BP 107/85 mmHg  Pulse 83  Temp(Src) 97.9 F (36.6 C) (Oral)  Resp 16  Ht 5\' 7"  (1.702 m)  Wt 145 lb (65.772 kg)  BMI 22.71 kg/m2  SpO2 100% Physical Exam  Constitutional: She is oriented to person, place, and time. She appears well-developed and well-nourished. No distress.  HENT:  Head: Normocephalic and atraumatic.  Cardiovascular: Normal rate, regular rhythm, normal heart sounds and intact distal pulses.   Pulmonary/Chest: Effort normal and breath sounds normal.  Musculoskeletal: She exhibits edema and tenderness.  ttp of lateral right ankle and hind foot.  DP pulse is brisk,distal sensation intact.  No erythema, abrasion, bruising or bony deformity.  No proximal tenderness. Compartments soft  Neurological: She is alert  and oriented to person, place, and time. She exhibits normal muscle tone. Coordination normal.  Skin: Skin is warm and dry.  Nursing note and vitals reviewed.   ED Course  Procedures (including critical care time)    Imaging Review Dg Foot Complete Right  06/18/2015  CLINICAL DATA:  Stepped in hole and fell 1 week ago. Right foot pain and bruising. Initial encounter. EXAM: RIGHT FOOT COMPLETE - 3+ VIEW COMPARISON:  None. FINDINGS: There is no evidence of fracture or dislocation. No evidence of arthropathy. Mild hallux valgus noted as well as prominent plantar calcaneal bone spur. IMPRESSION: No acute findings. Electronically Signed   By: Earle Gell M.D.   On: 06/18/2015 20:00   I have personally reviewed and evaluated these images and lab results as part of my medical decision-making.    MDM   Final diagnoses:  Ankle sprain, right, initial encounter    XR neg for fx.  NV and NS intact.  ASO applied, pain improved.  Pt agrees to RICE therapy and close ortho f/u in one week if needed.   Kem Parkinson, PA-C 06/21/15 2105  Tanna Furry, MD 06/26/15 [phone]

## 2015-08-05 DIAGNOSIS — Z823 Family history of stroke: Secondary | ICD-10-CM | POA: Diagnosis not present

## 2015-08-05 DIAGNOSIS — Z8249 Family history of ischemic heart disease and other diseases of the circulatory system: Secondary | ICD-10-CM | POA: Diagnosis not present

## 2015-08-05 DIAGNOSIS — Z880 Allergy status to penicillin: Secondary | ICD-10-CM | POA: Diagnosis not present

## 2015-08-05 DIAGNOSIS — M47812 Spondylosis without myelopathy or radiculopathy, cervical region: Secondary | ICD-10-CM | POA: Diagnosis not present

## 2015-08-05 DIAGNOSIS — Z7982 Long term (current) use of aspirin: Secondary | ICD-10-CM | POA: Diagnosis not present

## 2015-08-05 DIAGNOSIS — Z882 Allergy status to sulfonamides status: Secondary | ICD-10-CM | POA: Diagnosis not present

## 2015-08-05 DIAGNOSIS — M5030 Other cervical disc degeneration, unspecified cervical region: Secondary | ICD-10-CM | POA: Diagnosis not present

## 2015-08-05 DIAGNOSIS — Z794 Long term (current) use of insulin: Secondary | ICD-10-CM | POA: Diagnosis not present

## 2015-08-05 DIAGNOSIS — Z886 Allergy status to analgesic agent status: Secondary | ICD-10-CM | POA: Diagnosis not present

## 2015-08-05 DIAGNOSIS — E119 Type 2 diabetes mellitus without complications: Secondary | ICD-10-CM | POA: Diagnosis not present

## 2015-08-05 DIAGNOSIS — Z8673 Personal history of transient ischemic attack (TIA), and cerebral infarction without residual deficits: Secondary | ICD-10-CM | POA: Diagnosis not present

## 2015-08-05 DIAGNOSIS — Z79899 Other long term (current) drug therapy: Secondary | ICD-10-CM | POA: Diagnosis not present

## 2015-08-12 DIAGNOSIS — J209 Acute bronchitis, unspecified: Secondary | ICD-10-CM | POA: Diagnosis not present

## 2015-08-12 DIAGNOSIS — K120 Recurrent oral aphthae: Secondary | ICD-10-CM | POA: Diagnosis not present

## 2015-08-21 DIAGNOSIS — R05 Cough: Secondary | ICD-10-CM | POA: Diagnosis not present

## 2015-08-21 DIAGNOSIS — Z720 Tobacco use: Secondary | ICD-10-CM | POA: Diagnosis not present

## 2015-08-21 DIAGNOSIS — E1065 Type 1 diabetes mellitus with hyperglycemia: Secondary | ICD-10-CM | POA: Diagnosis not present

## 2015-08-25 LAB — HM DIABETES EYE EXAM

## 2015-08-26 DIAGNOSIS — R509 Fever, unspecified: Secondary | ICD-10-CM | POA: Diagnosis not present

## 2015-08-26 DIAGNOSIS — R32 Unspecified urinary incontinence: Secondary | ICD-10-CM | POA: Diagnosis not present

## 2015-08-26 DIAGNOSIS — R5383 Other fatigue: Secondary | ICD-10-CM | POA: Diagnosis not present

## 2015-09-03 DIAGNOSIS — M47814 Spondylosis without myelopathy or radiculopathy, thoracic region: Secondary | ICD-10-CM | POA: Diagnosis not present

## 2015-09-03 DIAGNOSIS — E1042 Type 1 diabetes mellitus with diabetic polyneuropathy: Secondary | ICD-10-CM | POA: Diagnosis not present

## 2015-09-03 DIAGNOSIS — M47812 Spondylosis without myelopathy or radiculopathy, cervical region: Secondary | ICD-10-CM | POA: Diagnosis not present

## 2015-09-03 DIAGNOSIS — M5030 Other cervical disc degeneration, unspecified cervical region: Secondary | ICD-10-CM | POA: Diagnosis not present

## 2015-09-04 DIAGNOSIS — E1040 Type 1 diabetes mellitus with diabetic neuropathy, unspecified: Secondary | ICD-10-CM | POA: Diagnosis not present

## 2015-09-04 DIAGNOSIS — Z881 Allergy status to other antibiotic agents status: Secondary | ICD-10-CM | POA: Diagnosis not present

## 2015-09-04 DIAGNOSIS — R05 Cough: Secondary | ICD-10-CM | POA: Diagnosis not present

## 2015-09-04 DIAGNOSIS — Z882 Allergy status to sulfonamides status: Secondary | ICD-10-CM | POA: Diagnosis not present

## 2015-09-04 DIAGNOSIS — E7800 Pure hypercholesterolemia, unspecified: Secondary | ICD-10-CM | POA: Diagnosis not present

## 2015-09-04 DIAGNOSIS — Z794 Long term (current) use of insulin: Secondary | ICD-10-CM | POA: Diagnosis not present

## 2015-09-04 DIAGNOSIS — Z885 Allergy status to narcotic agent status: Secondary | ICD-10-CM | POA: Diagnosis not present

## 2015-09-04 DIAGNOSIS — Z79899 Other long term (current) drug therapy: Secondary | ICD-10-CM | POA: Diagnosis not present

## 2015-09-04 DIAGNOSIS — J40 Bronchitis, not specified as acute or chronic: Secondary | ICD-10-CM | POA: Diagnosis not present

## 2015-09-04 DIAGNOSIS — Z880 Allergy status to penicillin: Secondary | ICD-10-CM | POA: Diagnosis not present

## 2015-09-04 DIAGNOSIS — I10 Essential (primary) hypertension: Secondary | ICD-10-CM | POA: Diagnosis not present

## 2015-09-08 ENCOUNTER — Ambulatory Visit (INDEPENDENT_AMBULATORY_CARE_PROVIDER_SITE_OTHER): Payer: Medicare Other | Admitting: Internal Medicine

## 2015-09-08 ENCOUNTER — Encounter: Payer: Self-pay | Admitting: Internal Medicine

## 2015-09-08 ENCOUNTER — Other Ambulatory Visit (INDEPENDENT_AMBULATORY_CARE_PROVIDER_SITE_OTHER): Payer: Medicare Other | Admitting: *Deleted

## 2015-09-08 VITALS — BP 112/64 | HR 98 | Temp 97.8°F | Resp 12 | Wt 166.8 lb

## 2015-09-08 DIAGNOSIS — E1065 Type 1 diabetes mellitus with hyperglycemia: Secondary | ICD-10-CM

## 2015-09-08 DIAGNOSIS — E1042 Type 1 diabetes mellitus with diabetic polyneuropathy: Secondary | ICD-10-CM

## 2015-09-08 LAB — POCT GLYCOSYLATED HEMOGLOBIN (HGB A1C): HEMOGLOBIN A1C: 10.6

## 2015-09-08 NOTE — Patient Instructions (Signed)
Please contact Roxann Ripple for the new Medtronic pump.  Please continue Lantus 26 units at bedtime. Change Humalog as follows: - Humalog dose as follows: - 10 units before a breakfast - 6 units before lunch - 14 units before dinner - SSI Humalog:  150-200: + 1 unit 201-250: + 2 units 251-300: + 3 units 301-350: + 4 units >350: + 5 units  Please return in 3 months.  Please schedule an appt with Leonia Reader for attaching the new pump.

## 2015-09-08 NOTE — Progress Notes (Signed)
Patient ID: [name], female   DOB: 1971-01-22, 45 y.o.   MRN: [id number]  HPI: [name] is a 45 y.o.-year-old female, returning for f/u for DM1, dx in 84 (45 y/o), uncontrolled, with complications (cerebro-vascular ds - h/o TIA 0/2016, PN). Last visit 6 mo ago. Had BCBS >> now [facility].  Had 2 cervical spine injections (epidural) since last visit (last 08/05/2015) and will have another one on Lumbar spine on 09/2015. Sugars higher. She also gained weight.  She also had an URI since last visit.  Last hemoglobin A1c was: Lab Results  Component Value Date   HGBA1C 9.4* 03/26/2015   HGBA1C 9.3* 12/06/2014   HGBA1C 11.5* 08/17/2014   She has been on an insulin pump before >> sugars higher (2000). She is interested to get back on this. Omnipod - got it 12/25/2014. She had pbs with [facility] [employer] supplies >> had to come off the Omni pod and will need to change to Medtronic pump >> did not start yet.  Pt was on a regimen of: - Pump settings:  - basal rates: 12 am: 0.85 units/h 7:30: 0.75 12 pm: 0.85 4 pm: 0.75 6  pm: 0.85 - ICR:   12 am: 8   1 pm: 6 >> 7 - target: 100-120 >> 110-120 - ISF: 70 - Insulin on Board: 4h - bolus wizard: on  TDD from basal insulin: 19.7 units TDD from bolus insulin: 5-10 units! - extended bolusing: not using - changes infusion site: q3 days - Meter: Omnipod meter  She is now on: - Lantus 26 units in am - Humalog dose as follows:(8 with lunch, 10 with b'fast, 12 units with dinner) - 8-10 units before a smaller meal  - 12 units before a regular meal - 14 units before a larger meal - SSI Humalog:  150-200: + 1 unit 201-250: + 2 units 251-300: + 3 units 301-350: + 4 units >350: + 5 units  Pt checks her sugars 1-2 a day and they are: - am: 126-323 >> 117, 195-300 >> 52-495 >> 106-258, 303  >> 139, 283-HI - 2h after b'fast: 48, 65, 189-332 >> n/c >> 64-150, 218 >> n/c - before lunch: 122, 191-266 >> 49-144 >> 153-265 >> 117-266, 368, 480  >> 244 - 2h after lunch: 271, 284 >> n/c >> n/c - before dinner: 305, 341 >> 44, 71-120, 193, 252 >> 125-341 >> 99-241, 450, 501x1 >> 66, 67, 121, 330 - 2h after dinner: n/c >> 48-468 >> 113, 231 >>148-585 - bedtime: n/c >> 52, 84, 114-150, 200 x2 >> 109-445 >> same >> 202, 276 - nighttime: 67, 75, 307 >> n/c No lows. Lowest sugar was 27 in 07/2014,  Recently: 66; she has no hypoglycemia awareness! Highest sugar was 601!  No h/o DKA or hypoglycemia admissions. Had a hyperglycemia ED visit 12/21/2014.  Pt's meals are: - Breakfast: toast + PB and bacon - Lunch: chicken salad + fruit - may skip - Dinner: veggies + meat - Snacks: 0 Drinks diet SunDrop sodas.   - no CKD, last BUN/creatinine:  Lab Results  Component Value Date   BUN 9 03/26/2015   CREATININE 0.67 03/26/2015   - last set of lipids: Lab Results  Component Value Date   CHOL 133 03/26/2015   HDL 49 03/26/2015   LDLCALC 76 03/26/2015   TRIG 42 03/26/2015   CHOLHDL 2.7 03/26/2015  On Zocor. - last eye exam was in 08/25/2015. No DR.  - + numbness and tingling  in her feet. Stopped Gabapentin b/c SEs >> restarted now at lower dose >> now again stopped >> Lyrica and Cymbalba.  She also has a h/o HL. No h/o hypothyroidism.  ROS: Constitutional: + weight gain, no fatigue,  + hot flashes Eyes: no blurry vision, no xerophthalmia ENT: + sore throat, no nodules palpated in throat, no dysphagia/odynophagia, no hoarseness Cardiovascular: no CP/palpitations/leg swelling Respiratory: + cough/+ SOB Gastrointestinal: no N/V/D/C Musculoskeletal: + muscle/no joint aches Skin: no rashes, + easy bruising Neurological: no tremors/numbness/tingling/no dizziness. She is not stumbling or falling as much as before.  I reviewed pt's medications, allergies, PMH, social hx, family hx, and changes were documented in the history of present illness. Otherwise, unchanged from my initial visit note:  Past Medical History  Diagnosis Date  .  Diabetes mellitus without complication ([place])   . Neuropathy ([place])   . Depression   . GERD (gastroesophageal reflux disease)   . Hiatal hernia   . Migraine headache   . IBS (irritable bowel syndrome)   . Vertigo   . Hypertension   . Hyperlipidemia   . Anxiety   . Abdominal wall mass of right flank 04/11/13  . Abrasion of leg with infection 02/18/14  . Benign paroxysmal vertigo 09/04/13  . Bronchitis, acute 02/28/14  . Bunion of great toe 04/11/13  . Cervicalgia 06/13/14  . Dorsalgia 05/17/14  . Dysuria 03/27/14  . Gastroenteritis 01/28/14  . Gastro-esophageal reflux disease without esophagitis 01/28/14  . Pain in thoracic spine 06/13/14  . 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 Peripheral neuropathy ([place]) 11/05/13  . Subungual hematoma of digit of hand   . Tendonitis 03/07/13  . Tobacco use   . TIA (transient ischemic attack)   . PE (physical exam), annual    Past Surgical History  Procedure Laterality Date  . Cesarean section    . Tubal ligation    . Abdominal hysterectomy    . Appendectomy    . Cholecystectomy    . Cyst excision      right breast  . Cardiac catheterization   last in 2009    x 4, normal coronary arteries  . Cardiac catheterization N/A 03/26/2015    Procedure: Left Heart Cath and Coronary Angiography;  Surgeon: Wellington Hampshire, MD;  Location: [facility];  Service: Cardiovascular;  Laterality: N/A;   History   Social History  . Marital Status: Divorced    Spouse Name: N/A  . Number of Children: 1   Occupational History  . disabled   Social History Main Topics  . Smoking status: Current Every Day Smoker -- 0.50 packs/day for 11 years  . Smokeless tobacco: Never Used     Comment: patient is aware that she needs to quit smoking  . Alcohol Use: No  . Drug Use: No   Current Outpatient Prescriptions on File Prior to Visit  Medication Sig Dispense Refill  . Alcohol Swabs (ALCOHOL PREP PAD) 70 % PADS   2  . aspirin EC 81 MG tablet Take 1 tablet  (81 mg total) by mouth daily.    . clonazePAM (KLONOPIN) 1 MG tablet Take 1 mg by mouth 2 (two) times daily as needed for anxiety.     Marland Kitchen estradiol (ESTRACE) 1 MG tablet Take 1 mg by mouth daily.    Marland Kitchen gabapentin (NEURONTIN) 300 MG capsule Take 600 mg by mouth daily.  0  . glucagon (GLUCAGON EMERGENCY) 1 MG injection Inject 1 mg into the muscle once as needed. (Patient  taking differently: Inject 1 mg into the muscle once as needed (for low blood sugar). ) 1 each 1  . glucose blood (BAYER CONTOUR NEXT TEST) test strip Use to test blood sugar 4-6 times daily as instructed. Dx: E10.40 185 each 5  . HYDROcodone-acetaminophen (NORCO/VICODIN) 5-325 MG tablet Take one tab po q 4-6 hrs prn pain 20 tablet 0  . Insulin Glargine (LANTUS SOLOSTAR) 100 UNIT/ML Solostar Pen Inject 24 Units into the skin daily before breakfast. 5 pen 2  . insulin lispro (HUMALOG KWIKPEN) 100 UNIT/ML KiwkPen Inject 0.18-0.2 mLs (18-20 Units total) into the skin 3 (three) times daily. 15 mL 2  . insulin lispro (HUMALOG) 100 UNIT/ML injection Use approx 80 units daily in insulin pump. (Patient not taking: Reported on 06/18/2015) 20 mL 1  . lisinopril (PRINIVIL,ZESTRIL) 5 MG tablet Take 5 mg by mouth daily.  2  . meloxicam (MOBIC) 15 MG tablet Take 15 mg by mouth daily.  0  . metoprolol succinate (TOPROL XL) 25 MG 24 hr tablet Take 0.5 tablets (12.5 mg total) by mouth daily. 30 tablet 0  . Multiple Vitamin (MULTIVITAMIN WITH MINERALS) TABS Take 1 tablet by mouth every morning.    . naproxen (NAPROSYN) 500 MG tablet Take 1 tablet (500 mg total) by mouth 2 (two) times daily with a meal. 20 tablet 0  . ONETOUCH DELICA LANCETS FINE MISC Use to test blood sugar 4-6 times daily as instructed. Dx: E10.40 600 each 3  . pantoprazole (PROTONIX) 40 MG tablet Take 1 tablet (40 mg total) by mouth daily at 12 noon. (Patient not taking: Reported on 06/18/2015) 30 tablet 0  . PARoxetine (PAXIL) 20 MG tablet Take 20 mg by mouth daily.  0  . pravastatin  (PRAVACHOL) 10 MG tablet Take 10 mg by mouth at bedtime.  2   No current facility-administered medications on file prior to visit.   Allergies  Allergen Reactions  . Zocor [Simvastatin] Other (See Comments)    Muscle aches and pains  . Clindamycin/Lincomycin Rash  . Codeine Rash  . Penicillins Rash  . Sulfa Antibiotics Rash        Family History  Problem Relation Age of Onset  . Stroke Mother 77  . Heart attack Mother 58  . Cancer Sister     colon  . Heart failure Maternal Grandmother 65  . Cancer Maternal Grandfather     prostate  . Heart failure Paternal Grandmother 30  . Heart failure Paternal Grandfather 60   PE: BP 112/64 mmHg  Pulse 98  Temp(Src) 97.8 F (36.6 C) (Oral)  Resp 12  Wt 166 lb 12.8 oz (75.66 kg)  SpO2 96% Body mass index is 26.12 kg/(m^2). Wt Readings from Last 3 Encounters:  09/08/15 166 lb 12.8 oz (75.66 kg)  06/18/15 145 lb (65.772 kg)  04/01/15 152 lb 12.8 oz (69.31 kg)   Constitutional: normal weight, in NAD Eyes: PERRLA, EOMI, no exophthalmos ENT: moist mucous membranes, no thyromegaly, no cervical lymphadenopathy Cardiovascular: RRR, No MRG Respiratory: CTA B Gastrointestinal: abdomen soft, NT, ND, BS+ Musculoskeletal: no deformities, strength intact in all 4 Skin: moist, warm, no rashes Neurological: no tremor with outstretched hands, DTR normal in all 4  ASSESSMENT: 1. DM1, insulin-dependent, uncontrolled, with complications - cerebro-vascular ds - h/o TIA 0/2016 - PN Sees cardiology >> Dr, Sharlotte Alamo [facility]ger - [facility]  PLAN:  1. Patient with long-standing, uncontrolled DM1, previously on Omnipod insulin pump, however her pump supplies provider did not cover an Omnipod pump and she  had to come off. She now has a new insurance, [place] [employer], and would like to start the Medtronic insulin pump. This is a great idea. I gave her a brochure about the 630 G MiniMed insulin pump and given the phone number for the Medtronic rep. I  advised her to get in touch with Roxann Ripple as soon as possible and try to arrange for her to have the MiniMed pump attached. She also needs a new visit with the diabetes educator to attach it and to make sure that she understands how it works. I would suggest that she also gets the Enlite CGM. - However, for now, until she gets the new pump, will decrease the insulin before lunch because she has lows before dinner and we'll also increase the insulin with dinner because she is very high in the morning. - I suggested to:   Patient Instructions  Please contact Roxann Ripple for the new Medtronic pump.  Please continue Lantus 26 units at bedtime. Change Humalog as follows: - Humalog dose as follows: - 10 units before a breakfast - 6 units before lunch - 14 units before dinner - SSI Humalog:  150-200: + 1 unit 201-250: + 2 units 251-300: + 3 units 301-350: + 4 units >350: + 5 units  Please return in 3 months.  Please schedule an appt with Leonia Reader for attaching the new pump.  - continue checking sugars at different times of the day - check 4 times a day, rotating checks - advised for yearly eye exams >> she is up-to-date  - no signs of other autoimmune disorders  - check HbA1c today >> 10.6% (higher) - Return to clinic in 3 mo with sugar log   - time spent with the patient: 40 min, of which >50% was spent in reviewing her pump downloads, discussing her hypo- and hyper-glycemic episodes, reviewing previous labs and pump settings, developing a plan to avoid hypo- and hyper-glycemia, and coordinating her care.

## 2015-09-15 ENCOUNTER — Other Ambulatory Visit: Payer: Self-pay | Admitting: Obstetrics & Gynecology

## 2015-09-22 DIAGNOSIS — Z79899 Other long term (current) drug therapy: Secondary | ICD-10-CM | POA: Diagnosis not present

## 2015-09-22 DIAGNOSIS — Z794 Long term (current) use of insulin: Secondary | ICD-10-CM | POA: Diagnosis not present

## 2015-09-22 DIAGNOSIS — Z880 Allergy status to penicillin: Secondary | ICD-10-CM | POA: Diagnosis not present

## 2015-09-22 DIAGNOSIS — Z7982 Long term (current) use of aspirin: Secondary | ICD-10-CM | POA: Diagnosis not present

## 2015-09-22 DIAGNOSIS — Z8249 Family history of ischemic heart disease and other diseases of the circulatory system: Secondary | ICD-10-CM | POA: Diagnosis not present

## 2015-09-22 DIAGNOSIS — Z8673 Personal history of transient ischemic attack (TIA), and cerebral infarction without residual deficits: Secondary | ICD-10-CM | POA: Diagnosis not present

## 2015-09-22 DIAGNOSIS — Z886 Allergy status to analgesic agent status: Secondary | ICD-10-CM | POA: Diagnosis not present

## 2015-09-22 DIAGNOSIS — M791 Myalgia: Secondary | ICD-10-CM | POA: Diagnosis not present

## 2015-09-22 DIAGNOSIS — M4802 Spinal stenosis, cervical region: Secondary | ICD-10-CM | POA: Diagnosis not present

## 2015-09-22 DIAGNOSIS — M47812 Spondylosis without myelopathy or radiculopathy, cervical region: Secondary | ICD-10-CM | POA: Diagnosis not present

## 2015-09-22 DIAGNOSIS — M47814 Spondylosis without myelopathy or radiculopathy, thoracic region: Secondary | ICD-10-CM | POA: Diagnosis not present

## 2015-09-22 DIAGNOSIS — Z882 Allergy status to sulfonamides status: Secondary | ICD-10-CM | POA: Diagnosis not present

## 2015-09-22 DIAGNOSIS — E1142 Type 2 diabetes mellitus with diabetic polyneuropathy: Secondary | ICD-10-CM | POA: Diagnosis not present

## 2015-09-22 DIAGNOSIS — M5030 Other cervical disc degeneration, unspecified cervical region: Secondary | ICD-10-CM | POA: Diagnosis not present

## 2015-09-22 DIAGNOSIS — Z823 Family history of stroke: Secondary | ICD-10-CM | POA: Diagnosis not present

## 2015-09-24 ENCOUNTER — Other Ambulatory Visit: Payer: Medicare Other

## 2015-09-24 ENCOUNTER — Other Ambulatory Visit: Payer: Self-pay | Admitting: *Deleted

## 2015-09-24 DIAGNOSIS — E1065 Type 1 diabetes mellitus with hyperglycemia: Secondary | ICD-10-CM | POA: Diagnosis not present

## 2015-09-24 DIAGNOSIS — E1042 Type 1 diabetes mellitus with diabetic polyneuropathy: Secondary | ICD-10-CM

## 2015-09-25 LAB — C-PEPTIDE: C-Peptide: <0.1 ng/mL — ABNORMAL LOW (ref 1.1–4.4)

## 2015-09-25 LAB — GLUCOSE, RANDOM: Glucose: 152 mg/dL — ABNORMAL HIGH (ref 65–99)

## 2015-09-26 ENCOUNTER — Encounter: Payer: Self-pay | Admitting: *Deleted

## 2015-09-26 ENCOUNTER — Encounter: Payer: Self-pay | Admitting: Obstetrics & Gynecology

## 2015-09-26 ENCOUNTER — Other Ambulatory Visit: Payer: Self-pay | Admitting: Obstetrics & Gynecology

## 2015-10-07 ENCOUNTER — Telehealth: Payer: Self-pay | Admitting: Internal Medicine

## 2015-10-07 NOTE — Telephone Encounter (Signed)
Can we please send the meter reading she gave at the last visit to # [phone]

## 2015-10-07 NOTE — Telephone Encounter (Signed)
Readings faxed to [phone].

## 2015-10-08 DIAGNOSIS — J309 Allergic rhinitis, unspecified: Secondary | ICD-10-CM | POA: Diagnosis not present

## 2015-10-12 DIAGNOSIS — M25551 Pain in right hip: Secondary | ICD-10-CM | POA: Diagnosis not present

## 2015-10-12 DIAGNOSIS — I10 Essential (primary) hypertension: Secondary | ICD-10-CM | POA: Diagnosis not present

## 2015-10-12 DIAGNOSIS — E7800 Pure hypercholesterolemia, unspecified: Secondary | ICD-10-CM | POA: Diagnosis not present

## 2015-10-12 DIAGNOSIS — E109 Type 1 diabetes mellitus without complications: Secondary | ICD-10-CM | POA: Diagnosis not present

## 2015-10-12 DIAGNOSIS — M25552 Pain in left hip: Secondary | ICD-10-CM | POA: Diagnosis not present

## 2015-10-12 DIAGNOSIS — Z79899 Other long term (current) drug therapy: Secondary | ICD-10-CM | POA: Diagnosis not present

## 2015-10-12 DIAGNOSIS — Z794 Long term (current) use of insulin: Secondary | ICD-10-CM | POA: Diagnosis not present

## 2015-10-13 ENCOUNTER — Encounter: Payer: Self-pay | Admitting: Obstetrics & Gynecology

## 2015-10-13 ENCOUNTER — Other Ambulatory Visit: Payer: Self-pay | Admitting: Obstetrics & Gynecology

## 2015-10-20 DIAGNOSIS — I10 Essential (primary) hypertension: Secondary | ICD-10-CM | POA: Diagnosis not present

## 2015-10-20 DIAGNOSIS — M47814 Spondylosis without myelopathy or radiculopathy, thoracic region: Secondary | ICD-10-CM | POA: Diagnosis not present

## 2015-10-20 DIAGNOSIS — Z794 Long term (current) use of insulin: Secondary | ICD-10-CM | POA: Diagnosis not present

## 2015-10-20 DIAGNOSIS — E1040 Type 1 diabetes mellitus with diabetic neuropathy, unspecified: Secondary | ICD-10-CM | POA: Diagnosis not present

## 2015-10-20 DIAGNOSIS — M47812 Spondylosis without myelopathy or radiculopathy, cervical region: Secondary | ICD-10-CM | POA: Diagnosis not present

## 2015-10-20 DIAGNOSIS — E1042 Type 1 diabetes mellitus with diabetic polyneuropathy: Secondary | ICD-10-CM | POA: Diagnosis not present

## 2015-10-20 DIAGNOSIS — M545 Low back pain: Secondary | ICD-10-CM | POA: Diagnosis not present

## 2015-10-20 DIAGNOSIS — M5416 Radiculopathy, lumbar region: Secondary | ICD-10-CM | POA: Diagnosis not present

## 2015-10-20 DIAGNOSIS — E7800 Pure hypercholesterolemia, unspecified: Secondary | ICD-10-CM | POA: Diagnosis not present

## 2015-10-20 DIAGNOSIS — Z79899 Other long term (current) drug therapy: Secondary | ICD-10-CM | POA: Diagnosis not present

## 2015-10-23 DIAGNOSIS — M546 Pain in thoracic spine: Secondary | ICD-10-CM | POA: Diagnosis not present

## 2015-10-23 DIAGNOSIS — Z7951 Long term (current) use of inhaled steroids: Secondary | ICD-10-CM | POA: Diagnosis not present

## 2015-10-23 DIAGNOSIS — Z794 Long term (current) use of insulin: Secondary | ICD-10-CM | POA: Diagnosis not present

## 2015-10-23 DIAGNOSIS — M79604 Pain in right leg: Secondary | ICD-10-CM | POA: Diagnosis not present

## 2015-10-23 DIAGNOSIS — I10 Essential (primary) hypertension: Secondary | ICD-10-CM | POA: Diagnosis not present

## 2015-10-23 DIAGNOSIS — E109 Type 1 diabetes mellitus without complications: Secondary | ICD-10-CM | POA: Diagnosis not present

## 2015-10-23 DIAGNOSIS — Z79899 Other long term (current) drug therapy: Secondary | ICD-10-CM | POA: Diagnosis not present

## 2015-10-23 DIAGNOSIS — F17200 Nicotine dependence, unspecified, uncomplicated: Secondary | ICD-10-CM | POA: Diagnosis not present

## 2015-10-23 DIAGNOSIS — M79605 Pain in left leg: Secondary | ICD-10-CM | POA: Diagnosis not present

## 2015-10-23 DIAGNOSIS — F419 Anxiety disorder, unspecified: Secondary | ICD-10-CM | POA: Diagnosis not present

## 2015-10-28 DIAGNOSIS — M549 Dorsalgia, unspecified: Secondary | ICD-10-CM | POA: Diagnosis not present

## 2015-10-28 DIAGNOSIS — M546 Pain in thoracic spine: Secondary | ICD-10-CM | POA: Diagnosis not present

## 2015-11-06 DIAGNOSIS — E7800 Pure hypercholesterolemia, unspecified: Secondary | ICD-10-CM | POA: Diagnosis not present

## 2015-11-06 DIAGNOSIS — M545 Low back pain: Secondary | ICD-10-CM | POA: Diagnosis not present

## 2015-11-06 DIAGNOSIS — Z7982 Long term (current) use of aspirin: Secondary | ICD-10-CM | POA: Diagnosis not present

## 2015-11-06 DIAGNOSIS — M5416 Radiculopathy, lumbar region: Secondary | ICD-10-CM | POA: Diagnosis not present

## 2015-11-06 DIAGNOSIS — Z794 Long term (current) use of insulin: Secondary | ICD-10-CM | POA: Diagnosis not present

## 2015-11-06 DIAGNOSIS — E1065 Type 1 diabetes mellitus with hyperglycemia: Secondary | ICD-10-CM | POA: Diagnosis not present

## 2015-11-06 DIAGNOSIS — I10 Essential (primary) hypertension: Secondary | ICD-10-CM | POA: Diagnosis not present

## 2015-11-18 ENCOUNTER — Encounter: Payer: Self-pay | Admitting: Obstetrics & Gynecology

## 2015-11-18 ENCOUNTER — Ambulatory Visit (INDEPENDENT_AMBULATORY_CARE_PROVIDER_SITE_OTHER): Payer: Medicare Other | Admitting: Obstetrics & Gynecology

## 2015-11-18 ENCOUNTER — Other Ambulatory Visit: Payer: Self-pay | Admitting: Obstetrics & Gynecology

## 2015-11-18 VITALS — BP 110/70 | HR 76 | Ht 67.0 in | Wt 162.0 lb

## 2015-11-18 DIAGNOSIS — Z01419 Encounter for gynecological examination (general) (routine) without abnormal findings: Secondary | ICD-10-CM

## 2015-11-18 DIAGNOSIS — Z1231 Encounter for screening mammogram for malignant neoplasm of breast: Secondary | ICD-10-CM

## 2015-11-18 DIAGNOSIS — Z1239 Encounter for other screening for malignant neoplasm of breast: Secondary | ICD-10-CM

## 2015-11-18 NOTE — Progress Notes (Signed)
Patient ID: [name], female   DOB: January 23, 1971, 45 y.o.   MRN: [id number] Subjective:     [name] is a 45 y.o. female here for a routine exam.  No LMP recorded. Patient has had a hysterectomy. No obstetric history on file. Birth Control Method:  hysterectomy Menstrual Calendar(currently): amenorrheic  Current complaints: smoking, diabetes control.   Current acute medical issues:  diabetes   Recent Gynecologic History No LMP recorded. Patient has had a hysterectomy. Last Pap: 2007  normal Last mammogram: years ago,  normal  Past Medical History  Diagnosis Date  . Diabetes mellitus without complication ([place])   . Neuropathy ([place])   . Depression   . GERD (gastroesophageal reflux disease)   . Hiatal hernia   . Migraine headache   . IBS (irritable bowel syndrome)   . Vertigo   . Hypertension   . Hyperlipidemia   . Anxiety   . Abdominal wall mass of right flank 04/11/13  . Abrasion of leg with infection 02/18/14  . Benign paroxysmal vertigo 09/04/13  . Bronchitis, acute 02/28/14  . Bunion of great toe 04/11/13  . Cervicalgia 06/13/14  . Dorsalgia 05/17/14  . Dysuria 03/27/14  . Gastroenteritis 01/28/14  . Gastro-esophageal reflux disease without esophagitis 01/28/14  . Pain in thoracic spine 06/13/14  . 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 Peripheral neuropathy ([place]) 11/05/13  . Subungual hematoma of digit of hand   . Tendonitis 03/07/13  . Tobacco use   . TIA (transient ischemic attack)   . PE (physical exam), annual     Past Surgical History  Procedure Laterality Date  . Cesarean section    . Tubal ligation    . Abdominal hysterectomy    . Appendectomy    . Cholecystectomy    . Cyst excision      right breast  . Cardiac catheterization   last in 2009    x 4, normal coronary arteries  . Cardiac catheterization N/A 03/26/2015    Procedure: Left Heart Cath and Coronary Angiography;  Surgeon: Wellington Hampshire, MD;  Location: [facility];  Service:  Cardiovascular;  Laterality: N/A;    OB History    No data available      Social History   Social History  . Marital Status: Divorced    Spouse Name: N/A  . Number of Children: N/A  . Years of Education: N/A   Social History Main Topics  . Smoking status: Current Every Day Smoker -- 0.50 packs/day for 11 years  . Smokeless tobacco: Never Used     Comment: patient is aware that she needs to quit smoking  . Alcohol Use: No  . Drug Use: No  . Sexual Activity: No   Other Topics Concern  . None   Social History Narrative    Family History  Problem Relation Age of Onset  . Stroke Mother 48  . Heart attack Mother 12  . Cancer Sister     colon  . Heart failure Maternal Grandmother 65  . Cancer Maternal Grandfather     prostate  . Heart failure Paternal Grandmother 69  . Heart failure Paternal Grandfather 61     Current outpatient prescriptions:  .  Alcohol Swabs (ALCOHOL PREP PAD) 70 % PADS, , Disp: , Rfl: 2 .  aspirin EC 81 MG tablet, Take 1 tablet (81 mg total) by mouth daily., Disp: , Rfl:  .  baclofen (LIORESAL) 10 MG tablet, Take 10 mg by mouth  3 (three) times daily. , Disp: , Rfl:  .  clonazePAM (KLONOPIN) 1 MG tablet, Take 1 mg by mouth 2 (two) times daily as needed for anxiety. , Disp: , Rfl:  .  DULoxetine (CYMBALTA) 30 MG capsule, Take 30 mg by mouth 3 (three) times daily. , Disp: , Rfl:  .  estradiol (ESTRACE) 1 MG tablet, Take 1 mg by mouth daily., Disp: , Rfl:  .  glucose blood (BAYER CONTOUR NEXT TEST) test strip, Use to test blood sugar 4-6 times daily as instructed. Dx: E10.40, Disp: 185 each, Rfl: 5 .  Insulin Glargine (LANTUS SOLOSTAR) 100 UNIT/ML Solostar Pen, Inject 24 Units into the skin daily before breakfast., Disp: 5 pen, Rfl: 2 .  insulin lispro (HUMALOG) 100 UNIT/ML injection, Use approx 80 units daily in insulin pump., Disp: 20 mL, Rfl: 1 .  lisinopril (PRINIVIL,ZESTRIL) 5 MG tablet, Take 5 mg by mouth daily., Disp: , Rfl: 2 .  LYRICA 75 MG  capsule, Take 75 mg by mouth 3 (three) times daily. , Disp: , Rfl:  .  metoprolol succinate (TOPROL XL) 25 MG 24 hr tablet, Take 0.5 tablets (12.5 mg total) by mouth daily., Disp: 30 tablet, Rfl: 0 .  Multiple Vitamin (MULTIVITAMIN WITH MINERALS) TABS, Take 1 tablet by mouth every morning., Disp: , Rfl:  .  ONETOUCH DELICA LANCETS FINE MISC, Use to test blood sugar 4-6 times daily as instructed. Dx: E10.40, Disp: 600 each, Rfl: 3 .  oxyCODONE-acetaminophen (PERCOCET) 7.5-325 MG tablet, Take 1 tablet by mouth every 6 (six) hours as needed. , Disp: , Rfl:  .  pravastatin (PRAVACHOL) 10 MG tablet, Take 10 mg by mouth at bedtime., Disp: , Rfl: 2 .  insulin lispro (HUMALOG KWIKPEN) 100 UNIT/ML KiwkPen, Inject 0.18-0.2 mLs (18-20 Units total) into the skin 3 (three) times daily., Disp: 15 mL, Rfl: 2  Review of Systems  Review of Systems  Constitutional: Negative for fever, chills, weight loss, malaise/fatigue and diaphoresis.  HENT: Negative for hearing loss, ear pain, nosebleeds, congestion, sore throat, neck pain, tinnitus and ear discharge.   Eyes: Negative for blurred vision, double vision, photophobia, pain, discharge and redness.  Respiratory: Negative for cough, hemoptysis, sputum production, shortness of breath, wheezing and stridor.   Cardiovascular: Negative for chest pain, palpitations, orthopnea, claudication, leg swelling and PND.  Gastrointestinal: negative for abdominal pain. Negative for heartburn, nausea, vomiting, diarrhea, constipation, blood in stool and melena.  Genitourinary: Negative for dysuria, urgency, frequency, hematuria and flank pain.  Musculoskeletal: Negative for myalgias, back pain, joint pain and falls.  Skin: Negative for itching and rash.  Neurological: Negative for dizziness, tingling, tremors, sensory change, speech change, focal weakness, seizures, loss of consciousness, weakness and headaches.  Endo/Heme/Allergies: Negative for environmental allergies and  polydipsia. Does not bruise/bleed easily.  Psychiatric/Behavioral: Negative for depression, suicidal ideas, hallucinations, memory loss and substance abuse. The patient is not nervous/anxious and does not have insomnia.        Objective:  Blood pressure 110/70, pulse 76, height 5\' 7"  (1.702 m), weight 162 lb (73.483 kg).   Physical Exam  Vitals reviewed. Constitutional: She is oriented to person, place, and time. She appears well-developed and well-nourished.  HENT:  Head: Normocephalic and atraumatic.        Right Ear: External ear normal.  Left Ear: External ear normal.  Nose: Nose normal.  Mouth/Throat: Oropharynx is clear and moist.  Eyes: Conjunctivae and EOM are normal. Pupils are equal, round, and reactive to light. Right eye exhibits no  discharge. Left eye exhibits no discharge. No scleral icterus.  Neck: Normal range of motion. Neck supple. No tracheal deviation present. No thyromegaly present.  Cardiovascular: Normal rate, regular rhythm, normal heart sounds and intact distal pulses.  Exam reveals no gallop and no friction rub.   No murmur heard. Respiratory: Effort normal and breath sounds normal. No respiratory distress. She has no wheezes. She has no rales. She exhibits no tenderness.  GI: Soft. Bowel sounds are normal. She exhibits no distension and no mass. There is no tenderness. There is no rebound and no guarding.  Genitourinary:  Breasts no masses skin changes or nipple changes bilaterally      Vulva is normal without lesions Vagina is pink moist without discharge Cervix is absent Uterus is absent Adnexa is negative with normal sized ovaries   Musculoskeletal: Normal range of motion. She exhibits no edema and no tenderness.  Neurological: She is alert and oriented to person, place, and time. She has normal reflexes. She displays normal reflexes. No cranial nerve deficit. She exhibits normal muscle tone. Coordination normal.  Skin: Skin is warm and dry. No rash  noted. No erythema. No pallor.  Psychiatric: She has a normal mood and affect. Her behavior is normal. Judgment and thought content normal.       Medications Ordered at today's visit: No orders of the defined types were placed in this encounter.    Other orders placed at today's visit: No orders of the defined types were placed in this encounter.      Assessment:    Healthy female exam.    Plan:    Mammogram ordered. Follow up in: 1 year.

## 2015-11-20 DIAGNOSIS — M5126 Other intervertebral disc displacement, lumbar region: Secondary | ICD-10-CM | POA: Diagnosis not present

## 2015-11-20 DIAGNOSIS — E1065 Type 1 diabetes mellitus with hyperglycemia: Secondary | ICD-10-CM | POA: Diagnosis not present

## 2015-11-20 DIAGNOSIS — M47816 Spondylosis without myelopathy or radiculopathy, lumbar region: Secondary | ICD-10-CM | POA: Diagnosis not present

## 2015-11-20 DIAGNOSIS — F329 Major depressive disorder, single episode, unspecified: Secondary | ICD-10-CM | POA: Diagnosis not present

## 2015-11-20 DIAGNOSIS — F410 Panic disorder [episodic paroxysmal anxiety] without agoraphobia: Secondary | ICD-10-CM | POA: Diagnosis not present

## 2015-11-21 ENCOUNTER — Ambulatory Visit (HOSPITAL_COMMUNITY): Payer: Medicare Other

## 2015-11-26 DIAGNOSIS — G8929 Other chronic pain: Secondary | ICD-10-CM | POA: Diagnosis not present

## 2015-11-26 DIAGNOSIS — M549 Dorsalgia, unspecified: Secondary | ICD-10-CM | POA: Diagnosis not present

## 2015-11-27 DIAGNOSIS — Z79899 Other long term (current) drug therapy: Secondary | ICD-10-CM | POA: Diagnosis not present

## 2015-11-27 DIAGNOSIS — Z7982 Long term (current) use of aspirin: Secondary | ICD-10-CM | POA: Diagnosis not present

## 2015-11-27 DIAGNOSIS — E7800 Pure hypercholesterolemia, unspecified: Secondary | ICD-10-CM | POA: Diagnosis not present

## 2015-11-27 DIAGNOSIS — M79604 Pain in right leg: Secondary | ICD-10-CM | POA: Diagnosis not present

## 2015-11-27 DIAGNOSIS — M79605 Pain in left leg: Secondary | ICD-10-CM | POA: Diagnosis not present

## 2015-11-27 DIAGNOSIS — Z794 Long term (current) use of insulin: Secondary | ICD-10-CM | POA: Diagnosis not present

## 2015-11-27 DIAGNOSIS — M47812 Spondylosis without myelopathy or radiculopathy, cervical region: Secondary | ICD-10-CM | POA: Diagnosis not present

## 2015-11-27 DIAGNOSIS — M549 Dorsalgia, unspecified: Secondary | ICD-10-CM | POA: Diagnosis not present

## 2015-11-27 DIAGNOSIS — G8929 Other chronic pain: Secondary | ICD-10-CM | POA: Diagnosis not present

## 2015-11-27 DIAGNOSIS — I10 Essential (primary) hypertension: Secondary | ICD-10-CM | POA: Diagnosis not present

## 2015-11-27 DIAGNOSIS — E1040 Type 1 diabetes mellitus with diabetic neuropathy, unspecified: Secondary | ICD-10-CM | POA: Diagnosis not present

## 2015-11-27 DIAGNOSIS — M47814 Spondylosis without myelopathy or radiculopathy, thoracic region: Secondary | ICD-10-CM | POA: Diagnosis not present

## 2015-12-05 ENCOUNTER — Ambulatory Visit: Payer: Medicare Other | Admitting: Internal Medicine

## 2015-12-05 DIAGNOSIS — Z0289 Encounter for other administrative examinations: Secondary | ICD-10-CM

## 2015-12-20 DIAGNOSIS — Z7982 Long term (current) use of aspirin: Secondary | ICD-10-CM | POA: Diagnosis not present

## 2015-12-20 DIAGNOSIS — E109 Type 1 diabetes mellitus without complications: Secondary | ICD-10-CM | POA: Diagnosis not present

## 2015-12-20 DIAGNOSIS — M5442 Lumbago with sciatica, left side: Secondary | ICD-10-CM | POA: Diagnosis not present

## 2015-12-20 DIAGNOSIS — Z79899 Other long term (current) drug therapy: Secondary | ICD-10-CM | POA: Diagnosis not present

## 2015-12-20 DIAGNOSIS — I10 Essential (primary) hypertension: Secondary | ICD-10-CM | POA: Diagnosis not present

## 2015-12-20 DIAGNOSIS — G8929 Other chronic pain: Secondary | ICD-10-CM | POA: Diagnosis not present

## 2015-12-20 DIAGNOSIS — Z794 Long term (current) use of insulin: Secondary | ICD-10-CM | POA: Diagnosis not present

## 2015-12-24 DIAGNOSIS — D225 Melanocytic nevi of trunk: Secondary | ICD-10-CM | POA: Diagnosis not present

## 2015-12-24 DIAGNOSIS — D224 Melanocytic nevi of scalp and neck: Secondary | ICD-10-CM | POA: Diagnosis not present

## 2015-12-24 DIAGNOSIS — B078 Other viral warts: Secondary | ICD-10-CM | POA: Diagnosis not present

## 2015-12-24 DIAGNOSIS — L821 Other seborrheic keratosis: Secondary | ICD-10-CM | POA: Diagnosis not present

## 2016-01-01 DIAGNOSIS — S76012A Strain of muscle, fascia and tendon of left hip, initial encounter: Secondary | ICD-10-CM | POA: Diagnosis not present

## 2016-01-01 DIAGNOSIS — M25552 Pain in left hip: Secondary | ICD-10-CM | POA: Diagnosis not present

## 2016-02-05 DIAGNOSIS — Z720 Tobacco use: Secondary | ICD-10-CM | POA: Diagnosis not present

## 2016-02-05 DIAGNOSIS — M7061 Trochanteric bursitis, right hip: Secondary | ICD-10-CM | POA: Diagnosis not present

## 2016-02-05 DIAGNOSIS — E1043 Type 1 diabetes mellitus with diabetic autonomic (poly)neuropathy: Secondary | ICD-10-CM | POA: Diagnosis not present

## 2016-02-05 DIAGNOSIS — Z6824 Body mass index (BMI) 24.0-24.9, adult: Secondary | ICD-10-CM | POA: Diagnosis not present

## 2016-02-09 ENCOUNTER — Ambulatory Visit: Payer: Medicare Other | Admitting: Internal Medicine

## 2016-02-09 DIAGNOSIS — Z0289 Encounter for other administrative examinations: Secondary | ICD-10-CM

## 2016-02-24 DIAGNOSIS — M542 Cervicalgia: Secondary | ICD-10-CM | POA: Diagnosis not present

## 2016-02-24 DIAGNOSIS — E109 Type 1 diabetes mellitus without complications: Secondary | ICD-10-CM | POA: Diagnosis not present

## 2016-02-24 DIAGNOSIS — Z794 Long term (current) use of insulin: Secondary | ICD-10-CM | POA: Diagnosis not present

## 2016-02-24 DIAGNOSIS — G8929 Other chronic pain: Secondary | ICD-10-CM | POA: Diagnosis not present

## 2016-02-24 DIAGNOSIS — Z882 Allergy status to sulfonamides status: Secondary | ICD-10-CM | POA: Diagnosis not present

## 2016-02-24 DIAGNOSIS — Z79899 Other long term (current) drug therapy: Secondary | ICD-10-CM | POA: Diagnosis not present

## 2016-02-24 DIAGNOSIS — I10 Essential (primary) hypertension: Secondary | ICD-10-CM | POA: Diagnosis not present

## 2016-02-24 DIAGNOSIS — M549 Dorsalgia, unspecified: Secondary | ICD-10-CM | POA: Diagnosis not present

## 2016-02-24 DIAGNOSIS — E7800 Pure hypercholesterolemia, unspecified: Secondary | ICD-10-CM | POA: Diagnosis not present

## 2016-02-24 DIAGNOSIS — Z885 Allergy status to narcotic agent status: Secondary | ICD-10-CM | POA: Diagnosis not present

## 2016-02-24 DIAGNOSIS — Z881 Allergy status to other antibiotic agents status: Secondary | ICD-10-CM | POA: Diagnosis not present

## 2016-02-24 DIAGNOSIS — Z880 Allergy status to penicillin: Secondary | ICD-10-CM | POA: Diagnosis not present

## 2016-03-01 DIAGNOSIS — M4726 Other spondylosis with radiculopathy, lumbar region: Secondary | ICD-10-CM | POA: Diagnosis not present

## 2016-03-01 DIAGNOSIS — E1040 Type 1 diabetes mellitus with diabetic neuropathy, unspecified: Secondary | ICD-10-CM | POA: Diagnosis not present

## 2016-03-01 DIAGNOSIS — G629 Polyneuropathy, unspecified: Secondary | ICD-10-CM | POA: Diagnosis not present

## 2016-03-01 DIAGNOSIS — E785 Hyperlipidemia, unspecified: Secondary | ICD-10-CM | POA: Diagnosis not present

## 2016-03-01 DIAGNOSIS — M519 Unspecified thoracic, thoracolumbar and lumbosacral intervertebral disc disorder: Secondary | ICD-10-CM | POA: Diagnosis not present

## 2016-03-01 DIAGNOSIS — M47812 Spondylosis without myelopathy or radiculopathy, cervical region: Secondary | ICD-10-CM | POA: Diagnosis not present

## 2016-03-01 DIAGNOSIS — F329 Major depressive disorder, single episode, unspecified: Secondary | ICD-10-CM | POA: Diagnosis not present

## 2016-03-30 DIAGNOSIS — M4726 Other spondylosis with radiculopathy, lumbar region: Secondary | ICD-10-CM | POA: Diagnosis not present

## 2016-03-30 DIAGNOSIS — E785 Hyperlipidemia, unspecified: Secondary | ICD-10-CM | POA: Diagnosis not present

## 2016-03-30 DIAGNOSIS — M5000 Cervical disc disorder with myelopathy, unspecified cervical region: Secondary | ICD-10-CM | POA: Diagnosis not present

## 2016-04-20 DIAGNOSIS — Z6824 Body mass index (BMI) 24.0-24.9, adult: Secondary | ICD-10-CM | POA: Diagnosis not present

## 2016-04-20 DIAGNOSIS — R5383 Other fatigue: Secondary | ICD-10-CM | POA: Diagnosis not present

## 2016-04-20 DIAGNOSIS — M2010 Hallux valgus (acquired), unspecified foot: Secondary | ICD-10-CM | POA: Diagnosis not present

## 2016-04-20 DIAGNOSIS — M79673 Pain in unspecified foot: Secondary | ICD-10-CM | POA: Diagnosis not present

## 2016-04-20 DIAGNOSIS — E1043 Type 1 diabetes mellitus with diabetic autonomic (poly)neuropathy: Secondary | ICD-10-CM | POA: Diagnosis not present

## 2016-04-29 DIAGNOSIS — M542 Cervicalgia: Secondary | ICD-10-CM | POA: Diagnosis not present

## 2016-04-29 DIAGNOSIS — M5000 Cervical disc disorder with myelopathy, unspecified cervical region: Secondary | ICD-10-CM | POA: Diagnosis not present

## 2016-04-29 DIAGNOSIS — M549 Dorsalgia, unspecified: Secondary | ICD-10-CM | POA: Diagnosis not present

## 2016-04-29 DIAGNOSIS — M4726 Other spondylosis with radiculopathy, lumbar region: Secondary | ICD-10-CM | POA: Diagnosis not present

## 2016-04-29 DIAGNOSIS — Z79899 Other long term (current) drug therapy: Secondary | ICD-10-CM | POA: Diagnosis not present

## 2016-04-29 DIAGNOSIS — E785 Hyperlipidemia, unspecified: Secondary | ICD-10-CM | POA: Diagnosis not present

## 2016-05-05 DIAGNOSIS — R110 Nausea: Secondary | ICD-10-CM | POA: Diagnosis not present

## 2016-05-05 DIAGNOSIS — J019 Acute sinusitis, unspecified: Secondary | ICD-10-CM | POA: Diagnosis not present

## 2016-05-05 DIAGNOSIS — Z6823 Body mass index (BMI) 23.0-23.9, adult: Secondary | ICD-10-CM | POA: Diagnosis not present

## 2016-05-05 DIAGNOSIS — R05 Cough: Secondary | ICD-10-CM | POA: Diagnosis not present

## 2016-05-10 DIAGNOSIS — J209 Acute bronchitis, unspecified: Secondary | ICD-10-CM | POA: Diagnosis not present

## 2016-05-10 DIAGNOSIS — Z6823 Body mass index (BMI) 23.0-23.9, adult: Secondary | ICD-10-CM | POA: Diagnosis not present

## 2016-05-10 DIAGNOSIS — R05 Cough: Secondary | ICD-10-CM | POA: Diagnosis not present

## 2016-05-17 ENCOUNTER — Encounter: Payer: Self-pay | Admitting: Internal Medicine

## 2016-05-17 ENCOUNTER — Ambulatory Visit (INDEPENDENT_AMBULATORY_CARE_PROVIDER_SITE_OTHER): Payer: Medicare Other | Admitting: Internal Medicine

## 2016-05-17 DIAGNOSIS — E1065 Type 1 diabetes mellitus with hyperglycemia: Secondary | ICD-10-CM

## 2016-05-17 DIAGNOSIS — E1042 Type 1 diabetes mellitus with diabetic polyneuropathy: Secondary | ICD-10-CM

## 2016-05-17 LAB — MICROALBUMIN / CREATININE URINE RATIO
Creatinine,U: 15.7 mg/dL
MICROALB/CREAT RATIO: 4.5 mg/g (ref 0.0–30.0)
Microalb, Ur: <0.7 mg/dL (ref 0.0–1.9)

## 2016-05-17 LAB — LIPID PANEL
Cholesterol: 230 mg/dL — ABNORMAL HIGH (ref 0–200)
HDL: 60.2 mg/dL (ref >39.00)
LDL Cholesterol: 136 mg/dL — ABNORMAL HIGH (ref 0–99)
NonHDL: 169.33
TRIGLYCERIDES: 169 mg/dL — AB (ref 0.0–149.0)
Total CHOL/HDL Ratio: 4
VLDL: 33.8 mg/dL (ref 0.0–40.0)

## 2016-05-17 LAB — TSH: TSH: 1.59 u[IU]/mL (ref 0.35–4.50)

## 2016-05-17 LAB — POCT GLYCOSYLATED HEMOGLOBIN (HGB A1C): HEMOGLOBIN A1C: 11

## 2016-05-17 NOTE — Progress Notes (Addendum)
Patient ID: [name], female   DOB: 1971-02-08, 45 y.o.   MRN: [id number]  HPI: Shauntelle Backlund is a 45 y.o.-year-old female, returning for f/u for DM1, dx in 67 (45 y/o), uncontrolled, with complications (cerebro-vascular ds - h/o TIA 0/2016, PN). Last visit 8 mo ago. Had BCBS >> now [facility].  She has a URI >> has been on Prednisone and ABx. Sugars higher.  Last hemoglobin A1c was: Lab Results  Component Value Date   HGBA1C 10.6 09/08/2015   HGBA1C 9.4 (H) 03/26/2015   HGBA1C 9.3 (H) 12/06/2014   She has been on an insulin pump before >> sugars higher (2000). She is interested to get back on this. Omnipod - got it 12/25/2014. She had pbs with [employer]: supplies >> had to come off the Omnipod and will need to change to [employer] pump >> did not start yet as it appeared in the [employer] records that she was on one before and she owed them money.  Pt was on a regimen of: - Pump settings:  - basal rates: 12 am: 0.85 units/h 7:30: 0.75 12 pm: 0.85 4 pm: 0.75 6  pm: 0.85 - ICR:   12 am: 8   1 pm: 6 >> 7 - target: 100-120 >> 110-120 - ISF: 70 - Insulin on Board: 4h - bolus wizard: on  TDD from basal insulin: 19.7 units TDD from bolus insulin: 5-10 units! - extended bolusing: not using - changes infusion site: q3 days - Meter: Omnipod meter  She is now on: - Lantus 26 units at bedtime. - Humalog dose as follows: - 10 units before a breakfast - 6 units before lunch - 14 units before dinner - SSI Humalog:  150-200: + 1 unit 201-250: + 2 units 251-300: + 3 units 301-350: + 4 units >350: + 5 units  Pt checks her sugars 1-2 a day and they are much better in the last few days as she was eating less 2/2 her URI. - am: 126-323 >> 117, 195-300 >> 52-495 >> 106-258, 303  >> 139, 283-HI >> 55-255 - 2h after b'fast: 48, 65, 189-332 >> n/c >> 64-150, 218 >> n/c - before lunch: 122, 191-266 >> 49-144 >> 153-265 >> 117-266, 368, 480 >> 244 >> 55-180 - 2h after lunch: 271,  284 >> n/c >> n/c - before dinner: 305, 341 >> 44, 71-120, 193, 252 >> 125-341 >> 99-241, 450, 501x1 >> 66, 67, 121, 330 >> 42-202, 253 - 2h after dinner: n/c >> 48-468 >> 113, 231 >>148-585 >> n/c - bedtime: n/c >> 52, 84, 114-150, 200 x2 >> 109-445 >> same >> 202, 276 >> 49, 55-185, 383 - nighttime: 67, 75, 307 >> n/c No lows. Lowest sugar was 27 in 07/2014,  Recently: 66 >> 42; she has no hypoglycemia awareness! Highest sugar was 601 >> 400s!  No h/o DKA or hypoglycemia admissions. Had a hyperglycemia ED visit 12/21/2014.  Pt's meals are: - Breakfast: toast + PB and bacon - Lunch: chicken salad + fruit - may skip - Dinner: veggies + meat - Snacks: 0 Drinks diet SunDrop sodas.   - no CKD, last BUN/creatinine:  Lab Results  Component Value Date   BUN 9 03/26/2015   CREATININE 0.67 03/26/2015   - last set of lipids: Lab Results  Component Value Date   CHOL 133 03/26/2015   HDL 49 03/26/2015   LDLCALC 76 03/26/2015   TRIG 42 03/26/2015   CHOLHDL 2.7 03/26/2015  On Zocor. - last  eye exam was in 08/25/2015. No DR.  - + numbness and tingling in her feet. Stopped Gabapentin b/c SEs >> restarted now at lower dose >> now again stopped >> Lyrica 100 mg daily and Cymbalta. On Lexapro.   She also has a h/o HL. No h/o hypothyroidism. On Diazepam.   ROS: Constitutional: + weight loss, no fatigue,  no hot flashes, + nocturia Eyes: no blurry vision, no xerophthalmia ENT: + sore throat, no nodules palpated in throat, no dysphagia/odynophagia, no hoarseness Cardiovascular: no CP/palpitations/leg swelling Respiratory: + cough/+ SOB/+ wheezing Gastrointestinal: no N/V/D/C Musculoskeletal: no muscle/no joint aches Skin: no rashes, no easy bruising Neurological: no tremors/numbness/tingling/no dizziness.  I reviewed pt's medications, allergies, PMH, social hx, family hx, and changes were documented in the history of present illness. Otherwise, unchanged from my initial visit note:  Past  Medical History:  Diagnosis Date  . Abdominal wall mass of right flank 04/11/13  . Abrasion of leg with infection 02/18/14  . Anxiety   . Benign paroxysmal vertigo 09/04/13  . Bronchitis, acute 02/28/14  . Bunion of great toe 04/11/13  . Cervicalgia 06/13/14  . Depression   . Diabetes mellitus without complication ([place])   . Dorsalgia 05/17/14  . Dysuria 03/27/14  . Gastro-esophageal reflux disease without esophagitis 01/28/14  . Gastroenteritis 01/28/14  . GERD (gastroesophageal reflux disease)   . Hiatal hernia   . Hyperlipidemia   . Hypertension   . IBS (irritable bowel syndrome)   . Migraine headache   . Neuropathy ([place])   . Pain in thoracic spine 06/13/14  . 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 PE (physical exam), annual   . Peripheral neuropathy ([place]) 11/05/13  . Subungual hematoma of digit of hand   . Tendonitis 03/07/13  . TIA (transient ischemic attack)   . Tobacco use   . Vertigo    Past Surgical History:  Procedure Laterality Date  . ABDOMINAL HYSTERECTOMY    . APPENDECTOMY    . CARDIAC CATHETERIZATION   last in 2009   x 4, normal coronary arteries  . CARDIAC CATHETERIZATION N/A 03/26/2015   Procedure: Left Heart Cath and Coronary Angiography;  Surgeon: Wellington Hampshire, MD;  Location: [facility];  Service: Cardiovascular;  Laterality: N/A;  . CESAREAN SECTION    . CHOLECYSTECTOMY    . CYST EXCISION     right breast  . TUBAL LIGATION     History   Social History  . Marital Status: Divorced    Spouse Name: N/A  . Number of Children: 1   Occupational History  . disabled   Social History Main Topics  . Smoking status: Current Every Day Smoker -- 0.50 packs/day for 11 years  . Smokeless tobacco: Never Used     Comment: patient is aware that she needs to quit smoking  . Alcohol Use: No  . Drug Use: No   Current Outpatient Prescriptions on File Prior to Visit  Medication Sig Dispense Refill  . Alcohol Swabs (ALCOHOL PREP PAD) 70 % PADS   2  .  aspirin EC 81 MG tablet Take 1 tablet (81 mg total) by mouth daily.    . DULoxetine (CYMBALTA) 30 MG capsule Take 30 mg by mouth 3 (three) times daily.     Marland Kitchen estradiol (ESTRACE) 1 MG tablet Take 1 mg by mouth daily.    Marland Kitchen glucose blood (BAYER CONTOUR NEXT TEST) test strip Use to test blood sugar 4-6 times daily as instructed. Dx: E10.40 185 each 5  .  Insulin Glargine (LANTUS SOLOSTAR) 100 UNIT/ML Solostar Pen Inject 24 Units into the skin daily before breakfast. (Patient taking differently: Inject 25 Units into the skin daily before breakfast. ) 5 pen 2  . insulin lispro (HUMALOG KWIKPEN) 100 UNIT/ML KiwkPen Inject 0.18-0.2 mLs (18-20 Units total) into the skin 3 (three) times daily. (Patient taking differently: Inject 10-14 Units into the skin 3 (three) times daily. ) 15 mL 2  . lisinopril (PRINIVIL,ZESTRIL) 5 MG tablet Take 5 mg by mouth daily.  2  . LYRICA 75 MG capsule Take 75 mg by mouth 3 (three) times daily.     . Multiple Vitamin (MULTIVITAMIN WITH MINERALS) TABS Take 1 tablet by mouth every morning.    Glory Rosebush DELICA LANCETS FINE MISC Use to test blood sugar 4-6 times daily as instructed. Dx: E10.40 600 each 3  . oxyCODONE-acetaminophen (PERCOCET) 7.5-325 MG tablet Take 1 tablet by mouth every 6 (six) hours as needed.     . pravastatin (PRAVACHOL) 10 MG tablet Take 10 mg by mouth at bedtime.  2  . baclofen (LIORESAL) 10 MG tablet Take 10 mg by mouth 3 (three) times daily.     . clonazePAM (KLONOPIN) 1 MG tablet Take 1 mg by mouth 2 (two) times daily as needed for anxiety.     . metoprolol succinate (TOPROL XL) 25 MG 24 hr tablet Take 0.5 tablets (12.5 mg total) by mouth daily. (Patient not taking: Reported on 05/17/2016) 30 tablet 0   No current facility-administered medications on file prior to visit.    Allergies  Allergen Reactions  . Zocor [Simvastatin] Other (See Comments)    Muscle aches and pains  . Clindamycin/Lincomycin Rash  . Codeine Rash  . Penicillins Rash  . Sulfa  Antibiotics Rash        Family History  Problem Relation Age of Onset  . Stroke Mother 60  . Heart attack Mother 36  . Cancer Sister     colon  . Heart failure Maternal Grandmother 65  . Cancer Maternal Grandfather     prostate  . Heart failure Paternal Grandmother 76  . Heart failure Paternal Grandfather 60   PE: BP (P) 104/62 (BP Location: Left Arm, Patient Position: Sitting)   Pulse (P) 95   Ht (P) 5\' 6"  (1.676 m)   Wt (P) 148 lb (67.1 kg)   SpO2 (P) 97%   BMI (P) 23.89 kg/m  Body mass index is 23.89 kg/m (pended). Wt Readings from Last 3 Encounters:  05/17/16 (P) 148 lb (67.1 kg)  11/18/15 162 lb (73.5 kg)  09/08/15 166 lb 12.8 oz (75.7 kg)   Constitutional: normal weight, in NAD Eyes: PERRLA, EOMI, no exophthalmos ENT: moist mucous membranes, no thyromegaly, no cervical lymphadenopathy Cardiovascular: RRR, No MRG Respiratory: CTA B Gastrointestinal: abdomen soft, NT, ND, BS+ Musculoskeletal: no deformities, strength intact in all 4 Skin: moist, warm, no rashes Neurological: no tremor with outstretched hands, DTR normal in all 4  ASSESSMENT: 1. DM1, insulin-dependent, uncontrolled, with complications - cerebro-vascular ds - h/o TIA 0/2016 - PN Sees cardiology >> Dr, Sharlotte Alamo [facility] - [facility]  PLAN:  1. Patient with long-standing, uncontrolled DM1, previously on Omnipod insulin pump, however her pump supplies provider did not cover an Omnipod pump and she had to come off. She now has a new insurance, [place] [employer], and will need to go back on the pump. I also suggested a Dexcom CGM. - However, for now, until she gets the new pump, will change her Humalog to  a dosing based on ICR >> 7 - I suggested to:  Patient Instructions  Please continue: - Lantus 26 units at bedtime. - Humalog dose - ICR 7 - SSI Humalog:  150-200: + 1 unit 201-250: + 2 units 251-300: + 3 units 301-350: + 4 units >350: + 5 units  Please schedule an appt with Leonia Reader for  discussion about pumps (Omnipod + Dexacom CGM).  Please come back for a follow-up appointment in 3 months  - continue checking sugars at different times of the day - check 4 times a day, rotating checks - advised for yearly eye exams >> she is up-to-date  - no signs of other autoimmune disorders  - given flu shot today - check HbA1c today >> 11% (higher) - Return to clinic in 3 mo with sugar log   Office Visit on 05/17/2016  Component Date Value Ref Range Status  . Cholesterol 05/17/2016 230* 0 - 200 mg/dL Final  . Triglycerides 05/17/2016 169.0* 0.0 - 149.0 mg/dL Final  . HDL 05/17/2016 60.20  >39.00 mg/dL Final  . VLDL 05/17/2016 33.8  0.0 - 40.0 mg/dL Final  . LDL Cholesterol 05/17/2016 136* 0 - 99 mg/dL Final  . Total CHOL/HDL Ratio 05/17/2016 4   Final  . NonHDL 05/17/2016 169.33   Final  . Sodium 05/18/2016 138  135 - 146 mmol/L Final  . Potassium 05/18/2016 4.8  3.5 - 5.3 mmol/L Final  . Chloride 05/18/2016 102  98 - 110 mmol/L Final  . CO2 05/18/2016 28  20 - 31 mmol/L Final  . Glucose, Bld 05/18/2016 184* 65 - 99 mg/dL Final  . BUN 05/18/2016 6* 7 - 25 mg/dL Final  . Creat 05/18/2016 0.68  0.50 - 1.10 mg/dL Final  . Total Bilirubin 05/18/2016 0.3  0.2 - 1.2 mg/dL Final  . Alkaline Phosphatase 05/18/2016 98  33 - 115 U/L Final  . AST 05/18/2016 15  10 - 35 U/L Final  . ALT 05/18/2016 17  6 - 29 U/L Final  . Total Protein 05/18/2016 7.0  6.1 - 8.1 g/dL Final  . Albumin 05/18/2016 4.3  3.6 - 5.1 g/dL Final  . Calcium 05/18/2016 9.9  8.6 - 10.2 mg/dL Final  . GFR, Est African American 05/18/2016 >89  >=60 mL/min Final  . GFR, Est Non African American 05/18/2016 >89  >=60 mL/min Final  . Microalb, Ur 05/17/2016 <0.7  0.0 - 1.9 mg/dL Final  . Creatinine,U 05/17/2016 15.7  mg/dL Final  . Microalb Creat Ratio 05/17/2016 4.5  0.0 - 30.0 mg/g Final  . TSH 05/17/2016 1.59  0.35 - 4.50 uIU/mL Final  . Hemoglobin A1C 05/17/2016 11.0   Final   High HbA1c and glucose.  LDL-cholesterol has increased.  Philemon Kingdom, MD PhD [facility] Endocrinology

## 2016-05-17 NOTE — Patient Instructions (Addendum)
Please continue: - Lantus 26 units at bedtime. - Humalog dose - ICR 7 - SSI Humalog:  150-200: + 1 unit 201-250: + 2 units 251-300: + 3 units 301-350: + 4 units >350: + 5 units  Please schedule an appt with Leonia Reader for discussion about pumps (Omnipod + Dexacom CGM).  Please come back for a follow-up appointment in 3 months

## 2016-05-17 NOTE — Addendum Note (Signed)
Addended by: Caprice Beaver T on: 05/17/2016 04:22 PM   Modules accepted: Orders

## 2016-05-18 LAB — COMPLETE METABOLIC PANEL WITH GFR
ALT: 17 U/L (ref 6–29)
AST: 15 U/L (ref 10–35)
Albumin: 4.3 g/dL (ref 3.6–5.1)
Alkaline Phosphatase: 98 U/L (ref 33–115)
BUN: 6 mg/dL — AB (ref 7–25)
CHLORIDE: 102 mmol/L (ref 98–110)
CO2: 28 mmol/L (ref 20–31)
CREATININE: 0.68 mg/dL (ref 0.50–1.10)
Calcium: 9.9 mg/dL (ref 8.6–10.2)
GFR, Est African American: >89 mL/min (ref >=60)
GFR, Est Non African American: >89 mL/min (ref >=60)
Glucose, Bld: 184 mg/dL — ABNORMAL HIGH (ref 65–99)
Potassium: 4.8 mmol/L (ref 3.5–5.3)
SODIUM: 138 mmol/L (ref 135–146)
Total Bilirubin: 0.3 mg/dL (ref 0.2–1.2)
Total Protein: 7 g/dL (ref 6.1–8.1)

## 2016-05-19 DIAGNOSIS — R05 Cough: Secondary | ICD-10-CM | POA: Diagnosis not present

## 2016-05-25 DIAGNOSIS — E785 Hyperlipidemia, unspecified: Secondary | ICD-10-CM | POA: Diagnosis not present

## 2016-05-25 DIAGNOSIS — M4726 Other spondylosis with radiculopathy, lumbar region: Secondary | ICD-10-CM | POA: Diagnosis not present

## 2016-05-25 DIAGNOSIS — M5000 Cervical disc disorder with myelopathy, unspecified cervical region: Secondary | ICD-10-CM | POA: Diagnosis not present

## 2016-05-27 ENCOUNTER — Ambulatory Visit (INDEPENDENT_AMBULATORY_CARE_PROVIDER_SITE_OTHER): Payer: Medicare Other | Admitting: Podiatry

## 2016-05-27 ENCOUNTER — Encounter: Payer: Self-pay | Admitting: *Deleted

## 2016-05-27 ENCOUNTER — Encounter: Payer: Self-pay | Admitting: Podiatry

## 2016-05-27 ENCOUNTER — Ambulatory Visit (INDEPENDENT_AMBULATORY_CARE_PROVIDER_SITE_OTHER): Payer: Medicare Other

## 2016-05-27 ENCOUNTER — Telehealth: Payer: Self-pay | Admitting: *Deleted

## 2016-05-27 VITALS — BP 107/74 | HR 88 | Resp 16

## 2016-05-27 DIAGNOSIS — M2010 Hallux valgus (acquired), unspecified foot: Secondary | ICD-10-CM

## 2016-05-27 DIAGNOSIS — M79671 Pain in right foot: Secondary | ICD-10-CM

## 2016-05-27 DIAGNOSIS — M79672 Pain in left foot: Secondary | ICD-10-CM | POA: Diagnosis not present

## 2016-05-27 DIAGNOSIS — M21619 Bunion of unspecified foot: Secondary | ICD-10-CM

## 2016-05-27 NOTE — Telephone Encounter (Deleted)
-----   Message from Edrick Kins, DPM sent at 05/27/2016  3:10 PM EDT ----- Regarding: Medical Clearance for surgery [name],   Patient will need medical clearance for elective bunion surgery.  Surgery : bunionectomy bilateral  Dx : Type I diabetes mellitus. Peripheral neuropathy. Bulging discs in back currently seeing pain management doc.   Thanks!

## 2016-05-27 NOTE — Progress Notes (Signed)
   Subjective:    Patient ID: [name], female    DOB: September 14, 1970, 45 y.o.   MRN: [id number]  HPI    Review of Systems  Musculoskeletal: Positive for back pain.  All other systems reviewed and are negative.      Objective:   Physical Exam        Assessment & Plan:

## 2016-05-27 NOTE — Telephone Encounter (Addendum)
-----   Message from Edrick Kins, DPM sent at 05/27/2016  3:10 PM EDT ----- Regarding: Medical Clearance for surgery Marcy Siren,   Patient will need medical clearance for elective bunion surgery.  Surgery : bunionectomy bilateral  Dx : Type I diabetes mellitus. Peripheral neuropathy. Bulging discs in back currently seeing pain management doc.    Thanks! Left message requesting pt call with the name of her primary care doctor, Dr. Amalia Hailey had requested medical clearance for the surgery. 05/28/2016-Pt called states has a mini-physical with her primary Dr. Rory Percy today and will fax a note from her primary.  I informed pt of the diagnosis Dr. Amalia Hailey had concerns, and instructed her to get a note from her pain management doctor with advisement of caring for her post op discomfort. Pt states understanding and I gave her my fax [phone] for the notes.06/01/2016-Danielle - Dr. Freddie Apley office states has information on pt. 06/02/2016-I spoke with Andee Poles - Dr. Freddie Apley office, states pt is on enough pain medication from their office to manage her post op pain, she currently taking Percocet 10/325, Lyrica and Diazepam. Left message informing pt our office had received a call from Dr. Merlene Laughter concerning her medications, and Dr. Amalia Hailey was waiting for Medical Clearance from her primary doctor, and evidence of her A1C being 9 or normal range. 09/16/2016-[facility] states they have reviewed pt's allergies and she states she is not allergic to Clindamycin. I review pt's Allergy list and 10/07/2012 on two occasions pt had reported an allergic reaction to Clindamycin. I informed [facility]. 09/17/2016-Female voice called states pt had surgery on both feet, and needs home health care. I spoke with pt, informed her that she would be able to be up on her feet for 15 minutes/hour, but resting 45 minutes out of the hour. I told pt she would be able to walk more balanced  with a walker and faxed orders to [facility].

## 2016-05-31 ENCOUNTER — Ambulatory Visit (INDEPENDENT_AMBULATORY_CARE_PROVIDER_SITE_OTHER): Payer: Medicare Other | Admitting: Podiatry

## 2016-05-31 DIAGNOSIS — E1043 Type 1 diabetes mellitus with diabetic autonomic (poly)neuropathy: Secondary | ICD-10-CM | POA: Diagnosis not present

## 2016-05-31 DIAGNOSIS — R739 Hyperglycemia, unspecified: Secondary | ICD-10-CM | POA: Diagnosis not present

## 2016-05-31 DIAGNOSIS — M2010 Hallux valgus (acquired), unspecified foot: Secondary | ICD-10-CM

## 2016-05-31 DIAGNOSIS — M79671 Pain in right foot: Secondary | ICD-10-CM

## 2016-05-31 DIAGNOSIS — Z6823 Body mass index (BMI) 23.0-23.9, adult: Secondary | ICD-10-CM | POA: Diagnosis not present

## 2016-05-31 DIAGNOSIS — M79672 Pain in left foot: Secondary | ICD-10-CM

## 2016-05-31 DIAGNOSIS — M5126 Other intervertebral disc displacement, lumbar region: Secondary | ICD-10-CM | POA: Diagnosis not present

## 2016-05-31 DIAGNOSIS — Z720 Tobacco use: Secondary | ICD-10-CM | POA: Diagnosis not present

## 2016-05-31 DIAGNOSIS — E1143 Type 2 diabetes mellitus with diabetic autonomic (poly)neuropathy: Secondary | ICD-10-CM | POA: Diagnosis not present

## 2016-05-31 DIAGNOSIS — M21619 Bunion of unspecified foot: Secondary | ICD-10-CM | POA: Diagnosis not present

## 2016-05-31 DIAGNOSIS — N390 Urinary tract infection, site not specified: Secondary | ICD-10-CM | POA: Diagnosis not present

## 2016-06-01 ENCOUNTER — Ambulatory Visit: Payer: Medicare Other | Admitting: Podiatry

## 2016-06-06 NOTE — Progress Notes (Signed)
Patient ID: [name], female   DOB: 1971/01/27, 45 y.o.   MRN: [id number] Subjective:  Patient presents today for evaluation of bilateral bunion deformities. Patient states the bunion deformities are very painful and slowly been progressing over the past several years. Patient does have a history of diabetes mellitus and her last hemoglobin A1c is approximately 11.0.  Patient presents today for further treatment and evaluation   Objective/Physical Exam General: The patient is alert and oriented x3 in no acute distress.  Dermatology: Skin is warm, dry and supple bilateral lower extremities. Negative for open lesions or macerations.  Vascular: Palpable pedal pulses bilaterally. No edema or erythema noted. Capillary refill within normal limits.  Neurological: Epicritic and protective threshold grossly intact bilaterally.   Musculoskeletal Exam: Clinical evidence of a hypertrophic medial eminence of the first metatarsal bilaterally consistent with bunion deformity. Range of motion within normal limits to all pedal and ankle joints bilateral. Muscle strength 5/5 in all groups bilateral.   Assessment: #1 diabetes mellitus-hemoglobin A1c 11.0 #2 hyperglycemia #3 hallux abductovalgus deformity bilateral #4 pain in bilateral feet #5 currently under chronic pain management   Plan of Care:  #1 Patient was evaluated. #2 today there is a detail discussion regarding the risks of elective surgery on a patient with known hyperglycemia. Today we did discuss in detail the surgical intervention. The patient opts for surgical correction due to the painful symptoms she's been experiencing. All conservative management has been unsuccessful in providing any sort of satisfactory alleviation of symptoms, including shoe gear modifications and padding. #3 today recommend to the patient reducing her hemoglobin A1c to 9.0. If the patient is able to reduce her hemoglobin we will proceed with surgical  intervention of her bilateral bunion deformity. #4 recommend medical clearance #5 return to clinic in 1 month  Dr. Edrick Kins, [place]

## 2016-06-06 NOTE — Progress Notes (Signed)
Subjective:  Patient presents today for follow-up evaluation of bilateral bunion deformities. Patient wants to have surgery. Patient was last seen on 05/27/2016. Patient does have a history of diabetes mellitus and her last hemoglobin A1c is approximately 11.0. Upon last visit was recommended that the patient reduce her blood sugar and get a medical clearance from her primary care physician. Patient does not have medical clearance at the moment.    Objective/Physical Exam General: The patient is alert and oriented x3 in no acute distress.  Dermatology: Skin is warm, dry and supple bilateral lower extremities. Negative for open lesions or macerations.  Vascular: Palpable pedal pulses bilaterally. No edema or erythema noted. Capillary refill within normal limits.  Neurological: Epicritic and protective threshold grossly intact bilaterally.   Musculoskeletal Exam: Clinical evidence of a hypertrophic medial eminence of the first metatarsal bilaterally consistent with bunion deformity. Range of motion within normal limits to all pedal and ankle joints bilateral. Muscle strength 5/5 in all groups bilateral.   Assessment: #1 diabetes mellitus-hemoglobin A1c 11.0 #2 hyperglycemia #3 hallux abductovalgus deformity bilateral #4 pain in bilateral feet   Plan of Care:  #1 Patient was evaluated. #2 today there is a detail discussion regarding the risks of elective surgery on a patient with known hyperglycemia. Today we did discuss in detail the surgical intervention. The patient opts for surgical correction due to the painful symptoms she's been experiencing. All conservative management has been unsuccessful in providing any sort of satisfactory alleviation of symptoms. #3 today recommend to the patient reducing her hemoglobin A1c to 9.0. If the patient is able to reduce her hemoglobin we will proceed with surgical intervention of her bilateral. #4 patient is to return to clinic in 3 months with new  hemoglobin A1c reading and medical clearance   Dr. Edrick Kins, [place]

## 2016-06-10 ENCOUNTER — Ambulatory Visit: Payer: Medicare Other | Admitting: Podiatry

## 2016-06-15 ENCOUNTER — Encounter: Payer: Medicare Other | Attending: Internal Medicine | Admitting: Nutrition

## 2016-06-15 DIAGNOSIS — Z713 Dietary counseling and surveillance: Secondary | ICD-10-CM | POA: Insufficient documentation

## 2016-06-15 DIAGNOSIS — E1065 Type 1 diabetes mellitus with hyperglycemia: Secondary | ICD-10-CM | POA: Insufficient documentation

## 2016-06-15 DIAGNOSIS — E1042 Type 1 diabetes mellitus with diabetic polyneuropathy: Secondary | ICD-10-CM | POA: Insufficient documentation

## 2016-06-18 NOTE — Patient Instructions (Signed)
Call when you receive the OmniPod, to set up an appt.

## 2016-06-18 NOTE — Progress Notes (Addendum)
Patient is wanting to go back on her insulin pump.  She has got insurance now and is hoping she can go back on the OmniPod  She was shown the different pumps, but wished to use the OmniPod.  She filled out the information sheet and it was faxed to [employer].   She will call me when she hears back from them to set up settings.   Copy of blood sugars are very variable. Last 2 weeeks: FBSs: 72-350, acL: 46-248, acS:110-480, HS: 65-324.  She is very frustrated, and believe the pump will help with some of this.  Suggested she see someone for review of carb counting.

## 2016-06-21 DIAGNOSIS — G4700 Insomnia, unspecified: Secondary | ICD-10-CM | POA: Diagnosis not present

## 2016-06-22 ENCOUNTER — Other Ambulatory Visit: Payer: Self-pay | Admitting: Pediatrics

## 2016-06-29 ENCOUNTER — Telehealth: Payer: Self-pay | Admitting: Internal Medicine

## 2016-06-29 ENCOUNTER — Other Ambulatory Visit (INDEPENDENT_AMBULATORY_CARE_PROVIDER_SITE_OTHER): Payer: Medicare Other

## 2016-06-29 DIAGNOSIS — E1065 Type 1 diabetes mellitus with hyperglycemia: Secondary | ICD-10-CM | POA: Diagnosis not present

## 2016-06-29 DIAGNOSIS — E1042 Type 1 diabetes mellitus with diabetic polyneuropathy: Secondary | ICD-10-CM | POA: Diagnosis not present

## 2016-06-29 LAB — POCT GLYCOSYLATED HEMOGLOBIN (HGB A1C): HEMOGLOBIN A1C: 11.4

## 2016-06-29 NOTE — Telephone Encounter (Signed)
Yes, absolutely, can we do a point-of-care HbA1c?

## 2016-06-29 NOTE — Telephone Encounter (Signed)
Pt is asking if she can come in to get her A1C done for her upcoming surgery

## 2016-06-29 NOTE — Telephone Encounter (Signed)
SCHEDULED PT AWARE

## 2016-06-29 NOTE — Telephone Encounter (Signed)
Please advise thanks.

## 2016-07-08 DIAGNOSIS — R110 Nausea: Secondary | ICD-10-CM | POA: Diagnosis not present

## 2016-07-08 DIAGNOSIS — J019 Acute sinusitis, unspecified: Secondary | ICD-10-CM | POA: Diagnosis not present

## 2016-07-21 DIAGNOSIS — M4726 Other spondylosis with radiculopathy, lumbar region: Secondary | ICD-10-CM | POA: Diagnosis not present

## 2016-07-21 DIAGNOSIS — Z882 Allergy status to sulfonamides status: Secondary | ICD-10-CM | POA: Diagnosis not present

## 2016-07-21 DIAGNOSIS — E1040 Type 1 diabetes mellitus with diabetic neuropathy, unspecified: Secondary | ICD-10-CM | POA: Diagnosis not present

## 2016-07-21 DIAGNOSIS — M79644 Pain in right finger(s): Secondary | ICD-10-CM | POA: Diagnosis not present

## 2016-07-21 DIAGNOSIS — M5000 Cervical disc disorder with myelopathy, unspecified cervical region: Secondary | ICD-10-CM | POA: Diagnosis not present

## 2016-07-21 DIAGNOSIS — Z881 Allergy status to other antibiotic agents status: Secondary | ICD-10-CM | POA: Diagnosis not present

## 2016-07-21 DIAGNOSIS — M542 Cervicalgia: Secondary | ICD-10-CM | POA: Diagnosis not present

## 2016-07-21 DIAGNOSIS — Z79891 Long term (current) use of opiate analgesic: Secondary | ICD-10-CM | POA: Diagnosis not present

## 2016-07-21 DIAGNOSIS — E109 Type 1 diabetes mellitus without complications: Secondary | ICD-10-CM | POA: Diagnosis not present

## 2016-07-21 DIAGNOSIS — M79641 Pain in right hand: Secondary | ICD-10-CM | POA: Diagnosis not present

## 2016-07-21 DIAGNOSIS — Z880 Allergy status to penicillin: Secondary | ICD-10-CM | POA: Diagnosis not present

## 2016-08-13 ENCOUNTER — Telehealth: Payer: Self-pay | Admitting: Internal Medicine

## 2016-08-13 NOTE — Telephone Encounter (Signed)
Received and will get Dr.Gherghe to finish filling out and will fax back.

## 2016-08-13 NOTE — Telephone Encounter (Signed)
Pt called in and said that Denzil Hughes will be sending [place] a form for her Omnipod supplies and was wanting to make sure we get that done as soon as we can.

## 2016-08-13 NOTE — Telephone Encounter (Signed)
Will monitor for Omnipod form for supplies.

## 2016-08-18 ENCOUNTER — Ambulatory Visit (HOSPITAL_COMMUNITY): Payer: Medicare Other

## 2016-08-19 ENCOUNTER — Ambulatory Visit: Payer: Medicare Other | Admitting: Internal Medicine

## 2016-08-20 DIAGNOSIS — E10311 Type 1 diabetes mellitus with unspecified diabetic retinopathy with macular edema: Secondary | ICD-10-CM | POA: Diagnosis not present

## 2016-08-20 DIAGNOSIS — E109 Type 1 diabetes mellitus without complications: Secondary | ICD-10-CM | POA: Diagnosis not present

## 2016-08-20 DIAGNOSIS — Z794 Long term (current) use of insulin: Secondary | ICD-10-CM | POA: Diagnosis not present

## 2016-08-20 DIAGNOSIS — H5213 Myopia, bilateral: Secondary | ICD-10-CM | POA: Diagnosis not present

## 2016-08-20 LAB — HM DIABETES EYE EXAM

## 2016-08-23 ENCOUNTER — Ambulatory Visit (INDEPENDENT_AMBULATORY_CARE_PROVIDER_SITE_OTHER): Payer: Medicare Other | Admitting: Podiatry

## 2016-08-23 DIAGNOSIS — M21619 Bunion of unspecified foot: Secondary | ICD-10-CM

## 2016-08-23 DIAGNOSIS — M79671 Pain in right foot: Secondary | ICD-10-CM | POA: Diagnosis not present

## 2016-08-23 DIAGNOSIS — M79672 Pain in left foot: Secondary | ICD-10-CM | POA: Diagnosis not present

## 2016-08-23 DIAGNOSIS — M2010 Hallux valgus (acquired), unspecified foot: Secondary | ICD-10-CM

## 2016-08-23 NOTE — Progress Notes (Signed)
   Subjective:  Patient presents today for follow-up evaluation of bilateral bunion deformities. Patient wants to have surgery. Patient states that her hemoglobin A1c is now below 9.0 mg/dL. On last visit which was 05/31/2016, I requested that the patient's hemoglobin A1c decrease to a level below 9.0 mg/dL in order to proceed with surgery. At that time we also discussed that she would need medical clearance from her primary care physician. Patient states that her bunions continue to aggravate when ambulating. There is no improvement of symptoms.    Objective/Physical Exam General: The patient is alert and oriented x3 in no acute distress.  Dermatology: Skin is warm, dry and supple bilateral lower extremities. Negative for open lesions or macerations.  Vascular: Palpable pedal pulses bilaterally. No edema or erythema noted. Capillary refill within normal limits.  Neurological: Epicritic and protective threshold grossly intact bilaterally.   Musculoskeletal Exam: Range of motion within normal limits to all pedal and ankle joints bilateral. Muscle strength 5/5 in all groups bilateral. Hallux abductovalgus with bunion deformity noted bilateral. Hallux abductus angle greater than 30. Radiographic exam taken prior demonstrates increased intermetatarsal angle greater than 15.  Assessment: #1 symptomatic hallux abductovalgus with bunion deformity bilateral   Plan of Care:  #1 Patient was evaluated. #2 today we had another detailed discussion regarding optimization of hemoglobin A1c and blood sugar to proceed with surgery. Now that her A1c levels are below 9.0 mg/dL we will proceed with surgery.  #3 authorization for surgery initiated today. Surgery will consist of bunionectomy with osteotomy bilateral. #4 prior to surgery we will require medical clearance by her primary care physician and documentation of her latest hemoglobin A1c at or below 9.0 mg/dL. #5 postoperative shoes dispensed today  bilateral  #6 return to clinic 1 week postop   Edrick Kins, DPM [facility]  Dr. Edrick Kins, [address]                                        [place], [place] [address]                Office [phone]  Fax [phone]

## 2016-08-23 NOTE — Patient Instructions (Signed)
Pre-Operative Instructions  Congratulations, you have decided to take an important step to improving your quality of life.  You can be assured that the doctors of [facility] will be with you every step of the way.  1. Plan to be at the surgery center/hospital at least 1 (one) hour prior to your scheduled time unless otherwise directed by the surgical center/hospital staff.  You must have a responsible adult accompany you, remain during the surgery and drive you home.  Make sure you have directions to the surgical center/hospital and know how to get there on time. 2. For hospital based surgery you will need to obtain a history and physical form from your family physician within 1 month prior to the date of surgery- we will give you a form for you primary physician.  3. We make every effort to accommodate the date you request for surgery.  There are however, times where surgery dates or times have to be moved.  We will contact you as soon as possible if a change in schedule is required.   4. No Aspirin/Ibuprofen for one week before surgery.  If you are on aspirin, any non-steroidal anti-inflammatory medications (Mobic, Aleve, Ibuprofen) you should stop taking it 7 days prior to your surgery.  You make take Tylenol  For pain prior to surgery.  5. Medications- If you are taking daily heart and blood pressure medications, seizure, reflux, allergy, asthma, anxiety, pain or diabetes medications, make sure the surgery center/hospital is aware before the day of surgery so they may notify you which medications to take or avoid the day of surgery. 6. No food or drink after midnight the night before surgery unless directed otherwise by surgical center/hospital staff. 7. No alcoholic beverages 24 hours prior to surgery.  No smoking 24 hours prior to or 24 hours after surgery. 8. Wear loose pants or shorts- loose enough to fit over bandages, boots, and casts. 9. No slip on shoes, sneakers are best. 10. Bring  your boot with you to the surgery center/hospital.  Also bring crutches or a walker if your physician has prescribed it for you.  If you do not have this equipment, it will be provided for you after surgery. 11. If you have not been contracted by the surgery center/hospital by the day before your surgery, call to confirm the date and time of your surgery. 12. Leave-time from work may vary depending on the type of surgery you have.  Appropriate arrangements should be made prior to surgery with your employer. 13. Prescriptions will be provided immediately following surgery by your doctor.  Have these filled as soon as possible after surgery and take the medication as directed. 14. Remove nail polish on the operative foot. 15. Wash the night before surgery.  The night before surgery wash the foot and leg well with the antibacterial soap provided and water paying special attention to beneath the toenails and in between the toes.  Rinse thoroughly with water and dry well with a towel.  Perform this wash unless told not to do so by your physician.  Enclosed: 1 Ice pack (please put in freezer the night before surgery)   1 Hibiclens skin cleaner   Pre-op Instructions  If you have any questions regarding the instructions, do not hesitate to call our office.  [place]: [address] [place], [place] [address] [phone]  [place]: [address] [place], [place] [address] [phone]  [place]: [address]  [place], [place] [address] [phone]   Dr.   Norman Regal DPM, Dr. Matthew Wagoner DPM, Dr. M. Todd Hyatt DPM, Dr. Titorya Stover DPM 

## 2016-08-25 ENCOUNTER — Telehealth: Payer: Self-pay | Admitting: Internal Medicine

## 2016-08-25 DIAGNOSIS — Z720 Tobacco use: Secondary | ICD-10-CM | POA: Diagnosis not present

## 2016-08-25 DIAGNOSIS — M21619 Bunion of unspecified foot: Secondary | ICD-10-CM | POA: Diagnosis not present

## 2016-08-25 DIAGNOSIS — E1065 Type 1 diabetes mellitus with hyperglycemia: Secondary | ICD-10-CM | POA: Diagnosis not present

## 2016-08-25 NOTE — Telephone Encounter (Signed)
FYI: Pt is having surgery on both feet

## 2016-08-25 NOTE — Telephone Encounter (Signed)
I hope all goes well

## 2016-08-26 ENCOUNTER — Ambulatory Visit (HOSPITAL_COMMUNITY): Payer: Medicare Other | Attending: Neurology | Admitting: Physical Therapy

## 2016-08-30 ENCOUNTER — Ambulatory Visit: Payer: Medicare Other | Admitting: Podiatry

## 2016-09-05 ENCOUNTER — Encounter (HOSPITAL_COMMUNITY): Payer: Self-pay | Admitting: Emergency Medicine

## 2016-09-05 ENCOUNTER — Emergency Department (HOSPITAL_COMMUNITY)
Admission: EM | Admit: 2016-09-05 | Discharge: 2016-09-05 | Disposition: A | Discharge status: Home / Self Care | Payer: Medicare Other | Attending: Emergency Medicine | Admitting: Emergency Medicine

## 2016-09-05 ENCOUNTER — Emergency Department (HOSPITAL_COMMUNITY): Payer: Medicare Other

## 2016-09-05 DIAGNOSIS — J40 Bronchitis, not specified as acute or chronic: Secondary | ICD-10-CM | POA: Insufficient documentation

## 2016-09-05 DIAGNOSIS — E119 Type 2 diabetes mellitus without complications: Secondary | ICD-10-CM | POA: Insufficient documentation

## 2016-09-05 DIAGNOSIS — Z794 Long term (current) use of insulin: Secondary | ICD-10-CM | POA: Diagnosis not present

## 2016-09-05 DIAGNOSIS — R52 Pain, unspecified: Secondary | ICD-10-CM | POA: Diagnosis present

## 2016-09-05 DIAGNOSIS — F17210 Nicotine dependence, cigarettes, uncomplicated: Secondary | ICD-10-CM | POA: Insufficient documentation

## 2016-09-05 DIAGNOSIS — Z7982 Long term (current) use of aspirin: Secondary | ICD-10-CM | POA: Diagnosis not present

## 2016-09-05 DIAGNOSIS — Z79899 Other long term (current) drug therapy: Secondary | ICD-10-CM | POA: Insufficient documentation

## 2016-09-05 DIAGNOSIS — I10 Essential (primary) hypertension: Secondary | ICD-10-CM | POA: Insufficient documentation

## 2016-09-05 MED ORDER — AZITHROMYCIN 250 MG PO TABS: 250.0000 mg | ORAL_TABLET | Freq: Every day | ORAL | 0 refills | Status: DC

## 2016-09-05 NOTE — Discharge Instructions (Signed)
Return if any problems.  See your Physician for recheck  °

## 2016-09-05 NOTE — ED Triage Notes (Signed)
Patient c/o generalized body aches, headache, and chills. Patient unsure of any fevers. Denies any coughing, sore throat, diarrhea, or vomiting.

## 2016-09-05 NOTE — ED Notes (Signed)
ED Provider at bedside. 

## 2016-09-05 NOTE — ED Provider Notes (Signed)
[facility] DEPT Provider Note   CSN: [id number] Arrival date & time: 09/05/16  1322  By signing my name below, I, Collene Leyden, attest that this documentation has been prepared under the direction and in the presence of Helen Hashimoto, PA-C Electronically Signed: Collene Leyden, Scribe. 09/05/16. 2:04 PM.   History   Chief Complaint Chief Complaint  Patient presents with  . Generalized Body Aches   HPI Comments: [name] is a 46 y.o. female with a hx of DM, who presents to the Emergency Department complaining of constant generalized boy aches that began 4 days ago. Patient reports associated intermittent headache, chills, and shortness of breath, and fatigue. Patient has had the flu shot. Sugars have been well, takes insulin. Patient denies any flu exposure, sick contacts, nausea, vomiting, cough, or fever.    The history is provided by the patient. No language interpreter was used.    Past Medical History:  Diagnosis Date  . Abdominal wall mass of right flank 04/11/13  . Abrasion of leg with infection 02/18/14  . Anxiety   . Benign paroxysmal vertigo 09/04/13  . Bronchitis, acute 02/28/14  . Bunion of great toe 04/11/13  . Cervicalgia 06/13/14  . Depression   . Diabetes mellitus without complication ([place])   . Dorsalgia 05/17/14  . Dysuria 03/27/14  . Gastro-esophageal reflux disease without esophagitis 01/28/14  . Gastroenteritis 01/28/14  . GERD (gastroesophageal reflux disease)   . Hiatal hernia   . Hyperlipidemia   . Hypertension   . IBS (irritable bowel syndrome)   . Migraine headache   . Neuropathy ([place])   . Pain in thoracic spine 06/13/14  . 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 PE (physical exam), annual   . Peripheral neuropathy ([place]) 11/05/13  . Subungual hematoma of digit of hand   . Tendonitis 03/07/13  . TIA (transient ischemic attack)   . Tobacco use   . Vertigo     Patient Active Problem List   Diagnosis Date Noted  . Chest pain  03/25/2015  . Hypertension   . Hyperlipidemia   . Anxiety   . Pain in the chest   . Essential hypertension   . 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 TIA (transient ischemic attack) 08/17/2014  . Tobacco abuse   . Gastro-esophageal reflux disease without esophagitis 01/28/2014    Past Surgical History:  Procedure Laterality Date  . ABDOMINAL HYSTERECTOMY    . APPENDECTOMY    . CARDIAC CATHETERIZATION   last in 2009   x 4, normal coronary arteries  . CARDIAC CATHETERIZATION N/A 03/26/2015   Procedure: Left Heart Cath and Coronary Angiography;  Surgeon: Wellington Hampshire, MD;  Location: [facility];  Service: Cardiovascular;  Laterality: N/A;  . CESAREAN SECTION    . CHOLECYSTECTOMY    . CYST EXCISION     right breast  . TUBAL LIGATION      OB History    No data available       Home Medications    Prior to Admission medications   Medication Sig Start Date End Date Taking? Authorizing Provider  Alcohol Swabs (ALCOHOL PREP PAD) 70 % PADS  06/07/14   Historical Provider, MD  aspirin EC 81 MG tablet Take 1 tablet (81 mg total) by mouth daily. 08/18/14   Bobby Rumpf [place], PA-C  baclofen (LIORESAL) 10 MG tablet Take 10 mg by mouth 3 (three) times daily.  09/03/15   Historical Provider, MD  Calcium Polycarbophil (FIBERCON PO) Take by mouth.    Historical Provider, MD  clonazePAM (KLONOPIN) 1 MG tablet Take 1 mg by mouth 2 (two) times daily as needed for anxiety.     Historical Provider, MD  diazepam (VALIUM) 5 MG tablet  05/13/16   Historical Provider, MD  DULoxetine (CYMBALTA) 30 MG capsule Take 30 mg by mouth 3 (three) times daily.  09/04/15   Historical Provider, MD  EPIPEN 2-PAK 0.3 MG/0.3ML SOAJ injection  02/13/16   Historical Provider, MD  escitalopram (LEXAPRO) 20 MG tablet Take by mouth.    Historical Provider, MD  estradiol (ESTRACE) 1 MG tablet Take 1 mg by mouth daily.    Historical Provider, MD  glucose blood (BAYER  CONTOUR NEXT TEST) test strip Use to test blood sugar 4-6 times daily as instructed. Dx: E10.40 04/03/15   Philemon Kingdom, MD  Insulin Glargine (LANTUS SOLOSTAR) 100 UNIT/ML Solostar Pen Inject 24 Units into the skin daily before breakfast. Patient taking differently: Inject 25 Units into the skin daily before breakfast.  04/08/15   Philemon Kingdom, MD  insulin lispro (HUMALOG KWIKPEN) 100 UNIT/ML KiwkPen Inject 0.18-0.2 mLs (18-20 Units total) into the skin 3 (three) times daily. Patient taking differently: Inject 10-14 Units into the skin 3 (three) times daily.  04/03/15   Philemon Kingdom, MD  lidocaine (XYLOCAINE) 5 % ointment  05/26/16   Historical Provider, MD  lisinopril (PRINIVIL,ZESTRIL) 5 MG tablet Take 5 mg by mouth daily. 06/10/15   Historical Provider, MD  LYRICA 75 MG capsule Take 75 mg by mouth 3 (three) times daily.  08/19/15   Historical Provider, MD  metoprolol succinate (TOPROL XL) 25 MG 24 hr tablet Take 0.5 tablets (12.5 mg total) by mouth daily. Patient not taking: Reported on 05/27/2016 03/27/15   Janece Canterbury, MD  Multiple Vitamin (MULTIVITAMIN WITH MINERALS) TABS Take 1 tablet by mouth every morning.    Historical Provider, MD  Omega-3 Fatty Acids (FISH OIL) 1200 MG CPDR Take by mouth.    Historical Provider, MD  [facility] DELICA LANCETS FINE MISC Use to test blood sugar 4-6 times daily as instructed. Dx: E10.40 11/07/14   Philemon Kingdom, MD  oxyCODONE-acetaminophen (PERCOCET) 7.5-325 MG tablet Take 1 tablet by mouth every 6 (six) hours as needed.  08/09/15   Historical Provider, MD  pravastatin (PRAVACHOL) 10 MG tablet Take 10 mg by mouth at bedtime. 05/29/15   Historical Provider, MD  predniSONE (DELTASONE) 20 MG tablet  05/10/16   Historical Provider, MD    Family History Family History  Problem Relation Age of Onset  . Stroke Mother 50  . Heart attack Mother 54  . Cancer Sister     colon  . Heart failure Maternal Grandmother 65  . Cancer Maternal Grandfather      prostate  . Heart failure Paternal Grandmother 19  . Heart failure Paternal Grandfather 60    Social History Social History  Substance Use Topics  . Smoking status: Current Every Day Smoker    Packs/day: 0.50    Years: 11.00    Types: Cigarettes  . Smokeless tobacco: Never Used     Comment: patient is aware that she needs to quit smoking  . Alcohol use No     Allergies   Zocor [simvastatin]; Clindamycin/lincomycin; Codeine; Penicillins; and Sulfa antibiotics   Review of Systems Review of Systems  Constitutional: Positive for chills. Negative for fever.  HENT: Negative for rhinorrhea and sore throat.   Respiratory: Positive for shortness of breath. Negative  for cough.   Musculoskeletal: Positive for myalgias.  Neurological: Positive for headaches.  All other systems reviewed and are negative.    Physical Exam Updated Vital Signs BP 121/76 (BP Location: Right Arm)   Pulse 80   Temp 98.2 F (36.8 C) (Oral)   Resp 18   Ht 5\' 5"  (1.651 m)   Wt 145 lb (65.8 kg)   SpO2 99%   BMI 24.13 kg/m   Physical Exam  Constitutional: She is oriented to person, place, and time. She appears well-developed.  HENT:  Head: Normocephalic and atraumatic.  Mouth/Throat: Oropharynx is clear and moist.  Eyes: Conjunctivae and EOM are normal. Pupils are equal, round, and reactive to light.  Neck: Normal range of motion. Neck supple.  Cardiovascular: Normal rate.   Pulmonary/Chest: Effort normal.  Abdominal: Soft. Bowel sounds are normal.  Musculoskeletal: Normal range of motion.  Neurological: She is alert and oriented to person, place, and time.  Skin: Skin is warm and dry.     ED Treatments / Results  DIAGNOSTIC STUDIES: Oxygen Saturation is 99% on RA, normal by my interpretation.    COORDINATION OF CARE: 2:04 PM Discussed treatment plan with pt at bedside and pt agreed to plan.  Labs (all labs ordered are listed, but only abnormal results are displayed) Labs Reviewed - No  data to display  EKG  EKG Interpretation None       Radiology No results found.  Procedures Procedures (including critical care time)  Medications Ordered in ED Medications - No data to display   Initial Impression / Assessment and Plan / ED Course  I have reviewed the triage vital signs and the nursing notes.  Pertinent labs & imaging results that were available during my care of the patient were reviewed by me and considered in my medical decision making (see chart for details).       Final Clinical Impressions(s) / ED Diagnoses   Final diagnoses:  Bronchitis    New Prescriptions Discharge Medication List as of 09/05/2016  3:02 PM    START taking these medications   Details  azithromycin (ZITHROMAX) 250 MG tablet Take 1 tablet (250 mg total) by mouth daily. Take first 2 tablets together, then 1 every day until finished., Starting Sun 09/05/2016, Print       An After Visit Summary was printed and given to the patient. I personally performed the services in this documentation, which was scribed in my presence.  The recorded information has been reviewed and considered.   Ronnald Collum.    Hollace Kinnier McBain, PA-C 09/05/16 [place], MD 09/07/16 [phone]

## 2016-09-08 ENCOUNTER — Telehealth: Payer: Self-pay | Admitting: Nutrition

## 2016-09-10 ENCOUNTER — Encounter: Payer: Self-pay | Admitting: *Deleted

## 2016-09-13 ENCOUNTER — Telehealth: Payer: Self-pay | Admitting: *Deleted

## 2016-09-13 NOTE — Telephone Encounter (Signed)
"  I have a few questions, my surgery is coming up on Thursday, the 15.  Give me a call back when you can.  I sure would appreciate it.  Have a blessed day."  I attempted to call patient.  I left her a message to call me back tomorrow.

## 2016-09-14 ENCOUNTER — Telehealth: Payer: Self-pay | Admitting: *Deleted

## 2016-09-14 DIAGNOSIS — Z9889 Other specified postprocedural states: Secondary | ICD-10-CM

## 2016-09-14 NOTE — Telephone Encounter (Addendum)
Pt states she is having surgery on 09/16/2016 and has questions. 09/24/2016-[facility] [place] [facility] states pt wants a rollator walker with a seat.

## 2016-09-14 NOTE — Telephone Encounter (Signed)
"  Have you received that fax yet."  No, I have not received the fax.  "Please let me know if you don't receive it by lunch time tomorrow and I will call you back.  Did Dr. Amalia Hailey say how much I could be up?"  They normally recommend 15 minutes per hour.

## 2016-09-14 NOTE — Telephone Encounter (Signed)
Patient is referring to medical clearance letter.  I did receive it.  Patient was cleared for surgery.

## 2016-09-14 NOTE — Telephone Encounter (Signed)
I'm returning your call.  How can I help you?"  I was wondering if I can get Dr. Amalia Hailey to arrange home health care for me after surgery."  Normally for what you are having home health care is not needed.  Would you like a prescription for crutches for stability?  "No, I don't think I will do well with crutches."  We have not heard from your doctors about medical clearance.  Can you call them to see if they can [place] the information needed?  Dr. Amalia Hailey will not be able to do the surgery without medical clearance.  "I will call them right now."  I called Dr. Freddie Apley office, pain management, to check on the status of medical clearance. I spoke to Justice Britain, Nurse Practitioner. She stated they have not given her any recently so she would have to see her before prescribing but she said Dr. Amalia Hailey can.  She said it is okay to prescribe Percocet 10 mg, one every 6 hours.  She stated she would call patient's pharmacy and tell them it's okay to fill the prescription.

## 2016-09-15 ENCOUNTER — Telehealth: Payer: Self-pay | Admitting: *Deleted

## 2016-09-15 DIAGNOSIS — M4726 Other spondylosis with radiculopathy, lumbar region: Secondary | ICD-10-CM | POA: Diagnosis not present

## 2016-09-15 DIAGNOSIS — Z79891 Long term (current) use of opiate analgesic: Secondary | ICD-10-CM | POA: Diagnosis not present

## 2016-09-15 DIAGNOSIS — M5000 Cervical disc disorder with myelopathy, unspecified cervical region: Secondary | ICD-10-CM | POA: Diagnosis not present

## 2016-09-15 DIAGNOSIS — M542 Cervicalgia: Secondary | ICD-10-CM | POA: Diagnosis not present

## 2016-09-15 NOTE — Telephone Encounter (Signed)
"  I'm calling to see if you received the fax."  Yes, we received the fax from your doctor.  "So I'm good to go?"  Yes, you are all set for surgery.

## 2016-09-16 ENCOUNTER — Telehealth: Payer: Self-pay | Admitting: Internal Medicine

## 2016-09-16 ENCOUNTER — Encounter: Payer: Self-pay | Admitting: Podiatry

## 2016-09-16 DIAGNOSIS — M2012 Hallux valgus (acquired), left foot: Secondary | ICD-10-CM | POA: Diagnosis not present

## 2016-09-16 DIAGNOSIS — M2021 Hallux rigidus, right foot: Secondary | ICD-10-CM | POA: Diagnosis not present

## 2016-09-16 DIAGNOSIS — M2022 Hallux rigidus, left foot: Secondary | ICD-10-CM | POA: Diagnosis not present

## 2016-09-16 DIAGNOSIS — E109 Type 1 diabetes mellitus without complications: Secondary | ICD-10-CM | POA: Diagnosis not present

## 2016-09-16 DIAGNOSIS — M2011 Hallux valgus (acquired), right foot: Secondary | ICD-10-CM | POA: Diagnosis not present

## 2016-09-16 NOTE — Telephone Encounter (Signed)
I agree that the HbA1c needs to be lower than 9% to facilitate healing after the surgery.

## 2016-09-16 NOTE — Telephone Encounter (Signed)
Miranda, one of the PA's at [facility], called and said that the patient is going to be undergoing a bilateral bunionectomy, and she wanted to get Dr. Arman Filter thoughts on the Pt's A1c being at 10.6 before this procedure.  She stated that Dr. Amalia Hailey would like for the A1c to be below 9, but she did want to have Dr. Arman Filter opinion before going on with the procedure. CB # [phone]

## 2016-09-17 NOTE — Telephone Encounter (Signed)
Called [facility], they will notify the Doctor, PA, and the patient. No other questions at this time.

## 2016-09-20 ENCOUNTER — Encounter: Payer: Self-pay | Admitting: *Deleted

## 2016-09-21 ENCOUNTER — Telehealth: Payer: Self-pay | Admitting: Internal Medicine

## 2016-09-21 NOTE — Telephone Encounter (Signed)
Submitted the fax to medical records. But submitted last office note to them with all the information they are needing today, will await confirmation.

## 2016-09-21 NOTE — Telephone Encounter (Signed)
[place] [facility] called in following up on the request for office notes for this patient. CB# [id number] REF# [id number]

## 2016-09-22 ENCOUNTER — Ambulatory Visit (INDEPENDENT_AMBULATORY_CARE_PROVIDER_SITE_OTHER): Payer: Medicare Other

## 2016-09-22 ENCOUNTER — Telehealth: Payer: Self-pay | Admitting: *Deleted

## 2016-09-22 ENCOUNTER — Ambulatory Visit (INDEPENDENT_AMBULATORY_CARE_PROVIDER_SITE_OTHER): Payer: Medicare Other | Admitting: Podiatry

## 2016-09-22 DIAGNOSIS — M21619 Bunion of unspecified foot: Secondary | ICD-10-CM

## 2016-09-22 DIAGNOSIS — M2010 Hallux valgus (acquired), unspecified foot: Secondary | ICD-10-CM

## 2016-09-22 DIAGNOSIS — Z9889 Other specified postprocedural states: Secondary | ICD-10-CM

## 2016-09-22 NOTE — Progress Notes (Signed)
Subjective: Patient presents today status post bilateral bunionectomy with double osteotomy. Date of surgery 09/16/2016. Patient states that she's doing well. She has a moderate amount of throbbing and swelling and pain however she is doing very well week postoperatively she states that the pain is gradually improving.  Objective: Skin incisions are well coapted sutures intact. No sign of infectious process noted. There is a very minimal amount of edema noted to the bilateral feet. Radiographic exam demonstrates intact orthopedic hardware stable osteotomies.  Assessment: Status post bilateral bunionectomy. Date of surgery 09/16/2016.  Plan of care: Today the patient was evaluated. X-rays were reviewed. Dressings were changed. Orders for a Rollator knee scooter through [facility] was placed. Return to clinic in 1 week

## 2016-09-22 NOTE — Telephone Encounter (Signed)
Patient contacted [facility] upon my request for follow-up to medical clearance request we sent to Dr. Nadara Mustard and Denny Levy, PA.  She stated they were going to send a fax.  Fax was received on 09/14/2016 from [facility].  It stated that patient was medically cleared pending labs.  We did not hear anything else from [facility] regarding lab results prior to surgery date.  Patient had surgery on 09/16/2016.   09/20/2016 Fax came in from [place], [place] on 09/20/2016.  Fax stated, I apologize for the delay of this letter.  She stated she had been out of the office and was also awaiting information from Dr. Cruzita Lederer, patient's Endocrinologist.  She stated they concluded that patient's glucose is not under control, 10.6mg /dl.  Medical clearance was not given because healing process will be difficult.

## 2016-09-23 ENCOUNTER — Telehealth: Payer: Self-pay | Admitting: *Deleted

## 2016-09-23 DIAGNOSIS — M2010 Hallux valgus (acquired), unspecified foot: Secondary | ICD-10-CM

## 2016-09-23 DIAGNOSIS — Z9889 Other specified postprocedural states: Secondary | ICD-10-CM

## 2016-09-23 NOTE — Telephone Encounter (Signed)
Done. Dr. Arshia Rondon

## 2016-09-23 NOTE — Telephone Encounter (Addendum)
-----   Message from Edrick Kins, DPM sent at 09/22/2016  7:54 PM EST ----- Regarding: Order for scooter Please order a "rollator" scooter throught [facility]. Patient's request, she gave me the information below:   [place] Fax# [phone] They also need a copy of patient's insurance care, apparently.   Thanks, Dr. Amalia Hailey  Dx: bilateral bunion surgery. DOS: 09/16/16. 09/23/2016-Orders for Knee scooter sent to [facility]. Pt states she had sent information about the knee scooter and the fax for [facility] this morning and no one has called her. I told her I was able to order on line with [facility] and often they had to wait for the doctor to sign the orders, I would email [facility] again and remind Dr. Amalia Hailey. 09/27/2016-Pt states she hit her toe and it doesn't feel right, but there is no bleeding. I contacted pt and she said she had setup an appt for today. 10/05/2016-Pt asked when she should wear the elastic sock and for how long. Left message informing pt she would be wearing the sock for about 6-9 months training the foot not to have surgery swelling, she would put the sock on 1st thing in the morning before swinging feet over the side of the bed, meaning she would need to shower at night. I told pt she would have varying degrees of swelling through the 6-9 month recovery, but if she noticed that she was having period of non-swelling she could try to go without the elastic socks and if no more swelling could continue without the sock but if the swelling recurred to go back into the elastic sock. 10/19/2016-Pt states Dr.EVans has paperwork for [facility] to have her moved to the 1st floor due to her Neuropathy.10/25/2016-Left message informing pt Dr.EVans did not have the [facility] paperwork and to fax to [phone].

## 2016-09-24 DIAGNOSIS — M21619 Bunion of unspecified foot: Secondary | ICD-10-CM | POA: Diagnosis not present

## 2016-09-24 NOTE — Addendum Note (Signed)
Addended by: Harriett Sine D on: 09/24/2016 01:06 PM   Modules accepted: Orders

## 2016-09-24 NOTE — Telephone Encounter (Signed)
Done

## 2016-09-27 ENCOUNTER — Ambulatory Visit (INDEPENDENT_AMBULATORY_CARE_PROVIDER_SITE_OTHER): Payer: Medicare Other

## 2016-09-27 ENCOUNTER — Encounter: Payer: Self-pay | Admitting: Podiatry

## 2016-09-27 ENCOUNTER — Ambulatory Visit (INDEPENDENT_AMBULATORY_CARE_PROVIDER_SITE_OTHER): Payer: Self-pay | Admitting: Podiatry

## 2016-09-27 VITALS — BP 135/89 | HR 67

## 2016-09-27 DIAGNOSIS — M21619 Bunion of unspecified foot: Secondary | ICD-10-CM

## 2016-09-27 DIAGNOSIS — M2010 Hallux valgus (acquired), unspecified foot: Secondary | ICD-10-CM

## 2016-09-27 DIAGNOSIS — Z9889 Other specified postprocedural states: Secondary | ICD-10-CM

## 2016-09-29 ENCOUNTER — Ambulatory Visit: Payer: Medicare Other | Admitting: Podiatry

## 2016-09-29 NOTE — Progress Notes (Signed)
DOS 02.15.2018 Bunionectomy with first metatarsal osteotomy bilateral. Any other indicated procedure.

## 2016-10-03 NOTE — Progress Notes (Signed)
Subjective: Patient presents today status post bilateral bunionectomy with double osteotomy. Date of surgery 09/16/2016. Patient states that she's doing well. She has a moderate amount of throbbing and swelling and pain however she is doing very well  Objective: Skin incisions are well coapted sutures intact. No sign of infectious process noted. There is a very minimal amount of edema noted to the bilateral feet. Radiographic exam demonstrates intact orthopedic hardware stable osteotomies.  Assessment: Status post bilateral bunionectomy. Date of surgery 09/16/2016.  Plan of care: Today the patient was evaluated. Dressings were changed. Return to clinic in 1 week  Follow-up to see if the patient received her Rollator knee scooter through [facility] next visit.

## 2016-10-04 ENCOUNTER — Encounter: Payer: Self-pay | Admitting: Podiatry

## 2016-10-04 ENCOUNTER — Ambulatory Visit (INDEPENDENT_AMBULATORY_CARE_PROVIDER_SITE_OTHER): Payer: Medicare Other | Admitting: Podiatry

## 2016-10-04 VITALS — BP 100/63 | HR 82 | Resp 16

## 2016-10-04 DIAGNOSIS — M2010 Hallux valgus (acquired), unspecified foot: Secondary | ICD-10-CM

## 2016-10-04 DIAGNOSIS — M21619 Bunion of unspecified foot: Secondary | ICD-10-CM

## 2016-10-04 DIAGNOSIS — R600 Localized edema: Secondary | ICD-10-CM

## 2016-10-04 DIAGNOSIS — Z9889 Other specified postprocedural states: Secondary | ICD-10-CM

## 2016-10-04 NOTE — Progress Notes (Signed)
Subjective: Patient presents today status post bilateral bunionectomy with double osteotomy. Date of surgery 09/16/2016. Patient states that she's doing well. She has a moderate amount of throbbing and swelling and pain however she is doing very well  Objective: Skin incisions are well coapted. No sign of infectious process noted. There is a very minimal amount of edema with ecchymosis noted to the bilateral feet.  Assessment: Status post bilateral bunionectomy. Date of surgery 09/16/2016.  Plan of care: Today the patient was evaluated. Dressings were changed today. Patient can begin showering in getting feet wet. Compression anklet dispensed bilateral. Return to clinic in 2 weeks. We will initiate physical therapy on Visit.  See if patient received a rollator knee scooter through [facility] next visit.

## 2016-10-08 DIAGNOSIS — E109 Type 1 diabetes mellitus without complications: Secondary | ICD-10-CM | POA: Diagnosis not present

## 2016-10-12 DIAGNOSIS — E109 Type 1 diabetes mellitus without complications: Secondary | ICD-10-CM | POA: Diagnosis not present

## 2016-10-12 DIAGNOSIS — Z794 Long term (current) use of insulin: Secondary | ICD-10-CM | POA: Diagnosis not present

## 2016-10-12 NOTE — Telephone Encounter (Signed)
Pt. Notified that paperwork was sent ot [employer] for pump supplies, to restart pump.

## 2016-10-14 ENCOUNTER — Other Ambulatory Visit: Payer: Self-pay

## 2016-10-14 ENCOUNTER — Telehealth: Payer: Self-pay | Admitting: Internal Medicine

## 2016-10-14 MED ORDER — GLUCOSE BLOOD VI STRP: ORAL_STRIP | 5 refills | Status: DC

## 2016-10-14 MED ORDER — INSULIN LISPRO 100 UNIT/ML ~~LOC~~ SOLN: SUBCUTANEOUS | 2 refills | Status: DC

## 2016-10-14 NOTE — Telephone Encounter (Signed)
Submitted both rx;s

## 2016-10-14 NOTE — Telephone Encounter (Signed)
Pt needs her Humulog sent in for the Pump that she is going to be starting next Wednesday to [facility].

## 2016-10-14 NOTE — Telephone Encounter (Signed)
Pt called in and also needs her Contour Next Strips, testing 4-5x daily also sent to [facility].

## 2016-10-14 NOTE — Telephone Encounter (Signed)
Please advise. What would the dosage be for her humalog for the pump. Thank you!

## 2016-10-14 NOTE — Telephone Encounter (Signed)
Up to 50 units a day.

## 2016-10-18 ENCOUNTER — Ambulatory Visit (INDEPENDENT_AMBULATORY_CARE_PROVIDER_SITE_OTHER): Payer: Self-pay | Admitting: Podiatry

## 2016-10-18 DIAGNOSIS — M2010 Hallux valgus (acquired), unspecified foot: Secondary | ICD-10-CM

## 2016-10-18 DIAGNOSIS — M21619 Bunion of unspecified foot: Secondary | ICD-10-CM

## 2016-10-18 DIAGNOSIS — Z9889 Other specified postprocedural states: Secondary | ICD-10-CM

## 2016-10-18 NOTE — Progress Notes (Signed)
Subjective: Patient presents today status post bilateral bunionectomy with double osteotomy. Date of surgery 09/16/2016. Patient states that she's doing well. Patient denies significant pain at this time   Objective: Skin incisions are well coapted. No sign of infectious process noted. There is a very minimal amount of edema with ecchymosis noted to the bilateral feet.  Assessment: Status post bilateral bunionectomy. Date of surgery 09/16/2016.  Plan of care: Today the patient was evaluated. Dressings were changed today. Patient can begin showering in getting feet wet. Continue compression anklet bilateral. Today were going to initiate physical therapy   See if patient received a rollator knee scooter through [facility] next visit.

## 2016-10-18 NOTE — Telephone Encounter (Addendum)
-----   Message from Edrick Kins, DPM sent at 10/18/2016  2:15 PM EDT ----- Regarding: physical therapy Physical therapy 3x/week x 4 weeks.  Dx: s/p bilateral bunionectomy DOS 09/16/16  Preferably in [place], [place]. Thanks, Dr. Amalia Hailey. Required referral and demographic faxed to [facility].

## 2016-10-20 ENCOUNTER — Encounter: Payer: Medicare Other | Admitting: Nutrition

## 2016-10-20 NOTE — Telephone Encounter (Signed)
This is the first I've heard of this. I'm fine with it, but I don't have any paperwork.  - Dr. Amalia Hailey

## 2016-10-21 DIAGNOSIS — M25674 Stiffness of right foot, not elsewhere classified: Secondary | ICD-10-CM | POA: Diagnosis not present

## 2016-10-21 DIAGNOSIS — M79672 Pain in left foot: Secondary | ICD-10-CM | POA: Diagnosis not present

## 2016-10-21 DIAGNOSIS — M79671 Pain in right foot: Secondary | ICD-10-CM | POA: Diagnosis not present

## 2016-10-21 DIAGNOSIS — M25675 Stiffness of left foot, not elsewhere classified: Secondary | ICD-10-CM | POA: Diagnosis not present

## 2016-10-25 DIAGNOSIS — M25674 Stiffness of right foot, not elsewhere classified: Secondary | ICD-10-CM | POA: Diagnosis not present

## 2016-10-25 DIAGNOSIS — M25675 Stiffness of left foot, not elsewhere classified: Secondary | ICD-10-CM | POA: Diagnosis not present

## 2016-10-25 DIAGNOSIS — M79671 Pain in right foot: Secondary | ICD-10-CM | POA: Diagnosis not present

## 2016-10-25 DIAGNOSIS — M79672 Pain in left foot: Secondary | ICD-10-CM | POA: Diagnosis not present

## 2016-10-26 ENCOUNTER — Encounter: Payer: Medicare Other | Admitting: Nutrition

## 2016-10-27 DIAGNOSIS — M25675 Stiffness of left foot, not elsewhere classified: Secondary | ICD-10-CM | POA: Diagnosis not present

## 2016-10-27 DIAGNOSIS — M79671 Pain in right foot: Secondary | ICD-10-CM | POA: Diagnosis not present

## 2016-10-27 DIAGNOSIS — M25674 Stiffness of right foot, not elsewhere classified: Secondary | ICD-10-CM | POA: Diagnosis not present

## 2016-10-27 DIAGNOSIS — M79672 Pain in left foot: Secondary | ICD-10-CM | POA: Diagnosis not present

## 2016-10-29 DIAGNOSIS — M79671 Pain in right foot: Secondary | ICD-10-CM | POA: Diagnosis not present

## 2016-10-29 DIAGNOSIS — M25675 Stiffness of left foot, not elsewhere classified: Secondary | ICD-10-CM | POA: Diagnosis not present

## 2016-10-29 DIAGNOSIS — M79672 Pain in left foot: Secondary | ICD-10-CM | POA: Diagnosis not present

## 2016-10-29 DIAGNOSIS — M25674 Stiffness of right foot, not elsewhere classified: Secondary | ICD-10-CM | POA: Diagnosis not present

## 2016-11-01 ENCOUNTER — Ambulatory Visit (INDEPENDENT_AMBULATORY_CARE_PROVIDER_SITE_OTHER): Payer: Self-pay | Admitting: Podiatry

## 2016-11-01 ENCOUNTER — Ambulatory Visit (INDEPENDENT_AMBULATORY_CARE_PROVIDER_SITE_OTHER): Payer: Medicare Other

## 2016-11-01 DIAGNOSIS — M21619 Bunion of unspecified foot: Secondary | ICD-10-CM

## 2016-11-01 DIAGNOSIS — M2010 Hallux valgus (acquired), unspecified foot: Secondary | ICD-10-CM

## 2016-11-01 DIAGNOSIS — Z9889 Other specified postprocedural states: Secondary | ICD-10-CM

## 2016-11-01 DIAGNOSIS — E1065 Type 1 diabetes mellitus with hyperglycemia: Secondary | ICD-10-CM | POA: Diagnosis not present

## 2016-11-01 DIAGNOSIS — E109 Type 1 diabetes mellitus without complications: Secondary | ICD-10-CM | POA: Diagnosis not present

## 2016-11-01 NOTE — Progress Notes (Signed)
Subjective: Patient presents today status post bilateral bunionectomy with double osteotomy. Date of surgery 09/16/2016. Patient sustained a slip and fall injury this past week injuring her right foot. Patient states that she noticed immediate tenderness and swelling to the right foot bunion surgery site.  Objective: Skin incisions are well coapted. No sign of infectious process noted. There is a very minimal amount of edema with ecchymosis noted to the bilateral feet.  Assessment: Status post bilateral bunionectomy. Date of surgery 09/16/2016.  Plan of care: Today the patient was evaluated. X-rays were reviewed which were consistent with a small hairline fracture, nondisplaced of the first metatarsal right foot. Orthopedic hardware appear to be intact with a stable osteotomy site. We will go back to minimal weightbearing right lower extremity in a postoperative shoe. Patient can begin wearing good supportive tennis shoes to the left lower extremity. Compression anklet dispensed today. Return to clinic in 4 weeks for new x-rays. See if patient received a rollator knee scooter through [facility] next visit.

## 2016-11-02 ENCOUNTER — Encounter: Payer: Medicare Other | Admitting: Nutrition

## 2016-11-15 ENCOUNTER — Ambulatory Visit: Payer: Medicare Other | Admitting: Podiatry

## 2016-11-15 DIAGNOSIS — Z79891 Long term (current) use of opiate analgesic: Secondary | ICD-10-CM | POA: Diagnosis not present

## 2016-11-15 DIAGNOSIS — M542 Cervicalgia: Secondary | ICD-10-CM | POA: Diagnosis not present

## 2016-11-15 DIAGNOSIS — M4726 Other spondylosis with radiculopathy, lumbar region: Secondary | ICD-10-CM | POA: Diagnosis not present

## 2016-11-15 DIAGNOSIS — M5000 Cervical disc disorder with myelopathy, unspecified cervical region: Secondary | ICD-10-CM | POA: Diagnosis not present

## 2016-11-23 ENCOUNTER — Other Ambulatory Visit: Payer: Self-pay

## 2016-11-23 NOTE — Patient Outreach (Signed)
Medication Adherence call talk to [name] she explain she just recently switch pharmacy and now is going to a [name] instead of [name], we call pharmacy to check to see if she had refill on both of her medication Lisinopril 5 mg  And pravastatin 10 mg she had refill on both medication but she wants a 90 days supply,we call doctor to have them send a 90 days supply and left a message with nurse.     [name] Direct Dial [phone]  Fax [phone] [name]

## 2016-11-24 ENCOUNTER — Ambulatory Visit (INDEPENDENT_AMBULATORY_CARE_PROVIDER_SITE_OTHER): Payer: Self-pay | Admitting: Podiatry

## 2016-11-24 ENCOUNTER — Ambulatory Visit (INDEPENDENT_AMBULATORY_CARE_PROVIDER_SITE_OTHER): Payer: Medicare Other

## 2016-11-24 DIAGNOSIS — Z9889 Other specified postprocedural states: Secondary | ICD-10-CM | POA: Diagnosis not present

## 2016-11-24 DIAGNOSIS — S99921A Unspecified injury of right foot, initial encounter: Secondary | ICD-10-CM

## 2016-11-24 DIAGNOSIS — M2010 Hallux valgus (acquired), unspecified foot: Secondary | ICD-10-CM

## 2016-11-24 DIAGNOSIS — M21619 Bunion of unspecified foot: Secondary | ICD-10-CM

## 2016-11-26 NOTE — Progress Notes (Signed)
Subjective: Patient presents today status post bilateral bunionectomy with double osteotomy. Date of surgery 09/16/2016. She states she feels like the left foot is swollen like there is a nodule near the incision site. Pt states she got a walking rollator.  Objective: Skin incisions are well coapted. No sign of infectious process noted. There is a minimal amount of edema noted to the bilateral lower extremity.  Assessment: Status post bilateral bunionectomy. Date of surgery 09/16/2016. Mild edema to bilateral lower extremities.  Plan of care: Today the patient was evaluated. X-rays were reviewed which show the orthopedic hardware and osteotomies sites appear to be stable with routine healing.  Plan of Care:  1. Patient was evaluated. X-rays reviewed 2. Continue wearing compression anklets. Begin to increase activity in good tennis shoes. Return to clinic in 3 months.   Edrick Kins, DPM [facility]  Dr. Edrick Kins, [address]                                        [place], [place] [address]                Office [phone]  Fax [phone]

## 2016-11-29 ENCOUNTER — Ambulatory Visit: Payer: Medicare Other | Admitting: Podiatry

## 2017-01-05 DIAGNOSIS — R350 Frequency of micturition: Secondary | ICD-10-CM | POA: Diagnosis not present

## 2017-01-05 DIAGNOSIS — Z720 Tobacco use: Secondary | ICD-10-CM | POA: Diagnosis not present

## 2017-01-05 DIAGNOSIS — E1065 Type 1 diabetes mellitus with hyperglycemia: Secondary | ICD-10-CM | POA: Diagnosis not present

## 2017-01-12 DIAGNOSIS — Z79891 Long term (current) use of opiate analgesic: Secondary | ICD-10-CM | POA: Diagnosis not present

## 2017-01-12 DIAGNOSIS — M5000 Cervical disc disorder with myelopathy, unspecified cervical region: Secondary | ICD-10-CM | POA: Diagnosis not present

## 2017-01-12 DIAGNOSIS — M4726 Other spondylosis with radiculopathy, lumbar region: Secondary | ICD-10-CM | POA: Diagnosis not present

## 2017-01-12 DIAGNOSIS — M542 Cervicalgia: Secondary | ICD-10-CM | POA: Diagnosis not present

## 2017-01-13 ENCOUNTER — Emergency Department (HOSPITAL_COMMUNITY): Payer: Medicare Other

## 2017-01-13 ENCOUNTER — Encounter (HOSPITAL_COMMUNITY): Payer: Self-pay | Admitting: Emergency Medicine

## 2017-01-13 ENCOUNTER — Emergency Department (HOSPITAL_COMMUNITY)
Admission: EM | Admit: 2017-01-13 | Discharge: 2017-01-13 | Disposition: A | Discharge status: Home / Self Care | Payer: Medicare Other | Attending: Emergency Medicine | Admitting: Emergency Medicine

## 2017-01-13 DIAGNOSIS — Z9049 Acquired absence of other specified parts of digestive tract: Secondary | ICD-10-CM | POA: Diagnosis not present

## 2017-01-13 DIAGNOSIS — I10 Essential (primary) hypertension: Secondary | ICD-10-CM | POA: Insufficient documentation

## 2017-01-13 DIAGNOSIS — Z7902 Long term (current) use of antithrombotics/antiplatelets: Secondary | ICD-10-CM | POA: Insufficient documentation

## 2017-01-13 DIAGNOSIS — R509 Fever, unspecified: Secondary | ICD-10-CM | POA: Insufficient documentation

## 2017-01-13 DIAGNOSIS — Z794 Long term (current) use of insulin: Secondary | ICD-10-CM | POA: Diagnosis not present

## 2017-01-13 DIAGNOSIS — Z8673 Personal history of transient ischemic attack (TIA), and cerebral infarction without residual deficits: Secondary | ICD-10-CM | POA: Diagnosis not present

## 2017-01-13 DIAGNOSIS — E119 Type 2 diabetes mellitus without complications: Secondary | ICD-10-CM | POA: Diagnosis not present

## 2017-01-13 DIAGNOSIS — F17210 Nicotine dependence, cigarettes, uncomplicated: Secondary | ICD-10-CM | POA: Diagnosis not present

## 2017-01-13 DIAGNOSIS — Z79899 Other long term (current) drug therapy: Secondary | ICD-10-CM | POA: Diagnosis not present

## 2017-01-13 DIAGNOSIS — R918 Other nonspecific abnormal finding of lung field: Secondary | ICD-10-CM | POA: Diagnosis not present

## 2017-01-13 DIAGNOSIS — Z7982 Long term (current) use of aspirin: Secondary | ICD-10-CM | POA: Insufficient documentation

## 2017-01-13 LAB — URINALYSIS, ROUTINE W REFLEX MICROSCOPIC
Bilirubin Urine: NEGATIVE
HGB URINE DIPSTICK: NEGATIVE
Ketones, ur: NEGATIVE mg/dL
NITRITE: NEGATIVE
Protein, ur: NEGATIVE mg/dL
Specific Gravity, Urine: 1.013 (ref 1.005–1.030)
pH: 6 (ref 5.0–8.0)

## 2017-01-13 LAB — CBC WITH DIFFERENTIAL/PLATELET
Basophils Absolute: 0 10*3/uL (ref 0.0–0.1)
Basophils Relative: 0 %
EOS PCT: 1 %
Eosinophils Absolute: 0.1 10*3/uL (ref 0.0–0.7)
HEMATOCRIT: 37 % (ref 36.0–46.0)
Hemoglobin: 12.8 g/dL (ref 12.0–15.0)
LYMPHS PCT: 30 %
Lymphs Abs: 3.5 10*3/uL (ref 0.7–4.0)
MCH: 30.5 pg (ref 26.0–34.0)
MCHC: 34.6 g/dL (ref 30.0–36.0)
MCV: 88.3 fL (ref 78.0–100.0)
MONO ABS: 1 10*3/uL (ref 0.1–1.0)
MONOS PCT: 9 %
NEUTROS ABS: 7 10*3/uL (ref 1.7–7.7)
Neutrophils Relative %: 60 %
PLATELETS: 194 10*3/uL (ref 150–400)
RBC: 4.19 MIL/uL (ref 3.87–5.11)
RDW: 12.5 % (ref 11.5–15.5)
WBC: 11.6 10*3/uL — ABNORMAL HIGH (ref 4.0–10.5)

## 2017-01-13 LAB — BASIC METABOLIC PANEL
Anion gap: 7 (ref 5–15)
BUN: 12 mg/dL (ref 6–20)
CALCIUM: 8.6 mg/dL — AB (ref 8.9–10.3)
CO2: 27 mmol/L (ref 22–32)
CREATININE: 0.81 mg/dL (ref 0.44–1.00)
Chloride: 99 mmol/L — ABNORMAL LOW (ref 101–111)
GFR calc Af Amer: >60 mL/min (ref >60)
GLUCOSE: 326 mg/dL — AB (ref 65–99)
Potassium: 3.9 mmol/L (ref 3.5–5.1)
Sodium: 133 mmol/L — ABNORMAL LOW (ref 135–145)

## 2017-01-13 NOTE — ED Notes (Signed)
Urine specimen obtained and sent over to lab

## 2017-01-13 NOTE — Discharge Instructions (Signed)
Your blood work is reassuring. Your urine shows some bacteria, but may be contaminated since you have not having any symptoms of UTI. It is sent for culture but we will not treat. This may be early viral illness. Please follow-up with your PCP for recheck in the next few days. Return for worsening symptoms, including persistent fevers, severe abdominal pain, intractable vomiting, confusion or any other symptoms concerning to you.

## 2017-01-13 NOTE — ED Triage Notes (Signed)
Pt states she noticed a knot on right side about 4 days ago.  Having fever and chills, no cough, nausea, vomiting or any other symptoms

## 2017-01-13 NOTE — ED Provider Notes (Signed)
[facility] DEPT Provider Note   CSN: [id number] Arrival date & time: 01/13/17  1930     History   Chief Complaint Chief Complaint  Patient presents with  . Fever    HPI [name] is a 46 y.o. female.  The history is provided by the patient.  Fever   This is a new problem. The current episode started more than 2 days ago. The problem occurs daily. The problem has been resolved. The maximum temperature noted was 102 to 102.9 F. The temperature was taken using an oral thermometer. Pertinent negatives include no chest pain, no sleepiness, no diarrhea, no vomiting, no congestion, no headaches, no sore throat, no muscle aches and no cough. She has tried acetaminophen for the symptoms. The treatment provided significant relief.   46 year old female who presents with fever and chills. Has noticed this for the past 3 days. 4 days ago noticed a tiny knot on the right side of her flank and she came to the ED for evaluation as she thought that this may be related to her symptoms. She denies any cough, congestion, sore throat or runny nose, abdominal pain, nausea or vomiting, diarrhea, dysuria or urinary frequency, abnormal vaginal bleeding or discharge. Strict diabetes, hypertension and hyperlipidemia. No recent hiking, rash or tick bites.   Past Medical History:  Diagnosis Date  . Abdominal wall mass of right flank 04/11/13  . Abrasion of leg with infection 02/18/14  . Anxiety   . Benign paroxysmal vertigo 09/04/13  . Bronchitis, acute 02/28/14  . Bunion of great toe 04/11/13  . Cervicalgia 06/13/14  . Depression   . Diabetes mellitus without complication ([place])   . Dorsalgia 05/17/14  . Dysuria 03/27/14  . Gastro-esophageal reflux disease without esophagitis 01/28/14  . Gastroenteritis 01/28/14  . GERD (gastroesophageal reflux disease)   . Hiatal hernia   . Hyperlipidemia   . Hypertension   . IBS (irritable bowel syndrome)   . Migraine headache   . Neuropathy   . Pain in  thoracic spine 06/13/14  . 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 PE (physical exam), annual   . Peripheral neuropathy 11/05/13  . Subungual hematoma of digit of hand   . Tendonitis 03/07/13  . TIA (transient ischemic attack)   . Tobacco use   . Vertigo     Patient Active Problem List   Diagnosis Date Noted  . Chest pain 03/25/2015  . Hypertension   . Hyperlipidemia   . Anxiety   . Pain in the chest   . Essential hypertension   . 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 TIA (transient ischemic attack) 08/17/2014  . Tobacco abuse   . Gastro-esophageal reflux disease without esophagitis 01/28/2014    Past Surgical History:  Procedure Laterality Date  . ABDOMINAL HYSTERECTOMY    . APPENDECTOMY    . CARDIAC CATHETERIZATION   last in 2009   x 4, normal coronary arteries  . CARDIAC CATHETERIZATION N/A 03/26/2015   Procedure: Left Heart Cath and Coronary Angiography;  Surgeon: Wellington Hampshire, MD;  Location: [facility];  Service: Cardiovascular;  Laterality: N/A;  . CESAREAN SECTION    . CHOLECYSTECTOMY    . CYST EXCISION     right breast  . TUBAL LIGATION      OB History    No data available       Home Medications    Prior to Admission medications   Medication Sig Start Date  End Date Taking? Authorizing Provider  aspirin EC 81 MG tablet Take 1 tablet (81 mg total) by mouth daily. Patient taking differently: Take 81 mg by mouth every other day.  08/18/14  Yes York, Marianne L, PA-C  Calcium Polycarbophil (FIBERCON PO) Take 1 tablet by mouth daily.    Yes [provider]  cholecalciferol (VITAMIN D) 1000 units tablet Take 1,000 Units by mouth daily.   Yes [provider]  diazepam (VALIUM) 5 MG tablet Take 5 mg by mouth every 6 (six) hours as needed for anxiety.  05/13/16  Yes [provider]  docusate sodium (COLACE) 100 MG capsule Take 100 mg by mouth 2 (two) times daily.   Yes  [provider]  DULoxetine (CYMBALTA) 60 MG capsule Take 60 mg by mouth daily.  09/04/15  Yes [provider]  EPIPEN 2-PAK 0.3 MG/0.3ML SOAJ injection Inject 0.3 mg into the muscle once.  02/13/16  Yes [provider]  escitalopram (LEXAPRO) 20 MG tablet Take 20 mg by mouth daily.    Yes [provider]  estradiol (ESTRACE) 1 MG tablet Take 1 mg by mouth daily.   Yes [provider]  Insulin Glargine (LANTUS SOLOSTAR) 100 UNIT/ML Solostar Pen Inject 24 Units into the skin daily before breakfast. Patient taking differently: Inject 25 Units into the skin daily before breakfast.  04/08/15  Yes Philemon Kingdom, MD  insulin lispro (HUMALOG) 100 UNIT/ML injection Use up to 50 units daily with pump Patient taking differently: Inject into the skin daily. Use up to 50 units daily with pump 10/14/16  Yes Philemon Kingdom, MD  lisinopril (PRINIVIL,ZESTRIL) 5 MG tablet Take 5 mg by mouth daily. 06/10/15  Yes [provider]  LYRICA 150 MG capsule Take 150 mg by mouth 3 (three) times daily.  08/19/15  Yes [provider]  Melatonin 5 MG TABS Take 1 tablet by mouth at bedtime as needed and may repeat dose one time if needed.   Yes [provider]  meloxicam (MOBIC) 15 MG tablet Take 15 mg by mouth daily. 09/16/16  Yes Edrick Kins, DPM  metoprolol succinate (TOPROL XL) 25 MG 24 hr tablet Take 0.5 tablets (12.5 mg total) by mouth daily. 03/27/15  Yes Short, Noah Delaine, MD  Multiple Vitamin (MULTIVITAMIN WITH MINERALS) TABS Take 1 tablet by mouth every morning.   Yes [provider]  Omega-3 Fatty Acids (FISH OIL) 1200 MG CPDR Take 1 capsule by mouth daily.    Yes [provider]  [facility] DELICA LANCETS FINE MISC Use to test blood sugar 4-6 times daily as instructed. Dx: E10.40 11/07/14  Yes Philemon Kingdom, MD  oxyCODONE-acetaminophen (PERCOCET) 10-325 MG tablet Take 1 tablet by mouth every 4 (four) hours as needed for pain.   09/16/16  Yes Edrick Kins, DPM  pravastatin (PRAVACHOL) 10 MG tablet Take 10 mg by mouth at bedtime. 05/29/15  Yes [provider]  cyclobenzaprine (FLEXERIL) 10 MG tablet Take 10 mg by mouth 2 (two) times daily. 01/12/17   [provider]  glucose blood (BAYER CONTOUR NEXT TEST) test strip Use to test blood sugar 4-6 times daily as instructed. Dx: E10.40 10/14/16   Philemon Kingdom, MD    Family History Family History  Problem Relation Age of Onset  . Stroke Mother 29  . Heart attack Mother 68  . Cancer Sister        colon  . Heart failure Maternal Grandmother 65  . Cancer Maternal Grandfather  prostate  . Heart failure Paternal Grandmother 55  . Heart failure Paternal Grandfather 60    Social History Social History  Substance Use Topics  . Smoking status: Current Every Day Smoker    Packs/day: 0.50    Years: 11.00    Types: Cigarettes  . Smokeless tobacco: Never Used     Comment: patient is aware that she needs to quit smoking  . Alcohol use No     Allergies   Zocor [simvastatin]; Clindamycin/lincomycin; Codeine; Penicillins; and Sulfa antibiotics   Review of Systems Review of Systems  Constitutional: Positive for fever.  HENT: Negative for congestion and sore throat.   Respiratory: Negative for cough.   Cardiovascular: Negative for chest pain.  Gastrointestinal: Negative for diarrhea and vomiting.  Neurological: Negative for headaches.  All other systems reviewed and are negative.    Physical Exam Updated Vital Signs BP 102/63 (BP Location: Right Arm)   Pulse 95   Temp 98.6 F (37 C) (Oral)   Resp 18   Ht 5\' 6"  (1.676 m)   Wt 68 kg (150 lb)   SpO2 95%   BMI 24.21 kg/m   Physical Exam Physical Exam  Nursing note and vitals reviewed. Constitutional: Well developed, well nourished, non-toxic, and in no acute distress Head: Normocephalic and atraumatic.  Mouth/Throat: Oropharynx is clear and moist.  Neck: Normal range of  motion. Neck supple.  Cardiovascular: Normal rate and regular rhythm.   Pulmonary/Chest: Effort normal and breath sounds normal.  Abdominal: Soft. There is no tenderness. There is no rebound and no guarding.  Musculoskeletal: Normal range of motion.  Neurological: Alert, no facial droop, fluent speech, moves all extremities symmetrically Skin: Skin is warm and dry. small < 1 cm palpable knot versus lymph node over left lateral lower chest wall in mid axillary line. No rash Psychiatric: Cooperative   ED Treatments / Results  Labs (all labs ordered are listed, but only abnormal results are displayed) Labs Reviewed  CBC WITH DIFFERENTIAL/PLATELET - Abnormal; Notable for the following:       Result Value   WBC 11.6 (*)    All other components within normal limits  BASIC METABOLIC PANEL - Abnormal; Notable for the following:    Sodium 133 (*)    Chloride 99 (*)    Glucose, Bld 326 (*)    Calcium 8.6 (*)    All other components within normal limits  URINALYSIS, ROUTINE W REFLEX MICROSCOPIC - Abnormal; Notable for the following:    APPearance HAZY (*)    Glucose, UA >=500 (*)    Leukocytes, UA MODERATE (*)    Bacteria, UA RARE (*)    Squamous Epithelial / LPF 6-30 (*)    All other components within normal limits  URINE CULTURE    EKG  EKG Interpretation None       Radiology Dg Chest 2 View  Result Date: 01/13/2017 CLINICAL DATA:  Right flank area small knot EXAM: CHEST  2 VIEW COMPARISON:  09/05/2016 FINDINGS: The heart size and mediastinal contours are within normal limits. Both lungs are clear. The visualized skeletal structures are unremarkable. Surgical clips in the right upper quadrant. IMPRESSION: No active cardiopulmonary disease. Electronically Signed   By: Donavan Foil M.D.   On: 01/13/2017 21:14    Procedures Procedures (including critical care time) EMERGENCY DEPARTMENT US SOFT TISSUE INTERPRETATION "Study: Limited Soft Tissue Ultrasound"  INDICATIONS:  knot Multiple views of the body part were obtained in real-time with a multi-frequency linear probe  PERFORMED  BY: Myself IMAGES ARCHIVED?: No SIDE:Right  BODY PART:Chest wall INTERPRETATION:  No abcess noted and No cellulitis noted Small 0.6 cm hypoechoic lesion, noncompressible     Medications Ordered in ED Medications - No data to display   Initial Impression / Assessment and Plan / ED Course  I have reviewed the triage vital signs and the nursing notes.  Pertinent labs & imaging results that were available during my care of the patient were reviewed by me and considered in my medical decision making (see chart for details).     Presenting with fevers and chills for 3 days. Without any other symptoms of infection. He is well-appearing in no acute distress. Afebrile ED with stable vital signs. No obvious source of infection. Chest x-ray without pneumonia. Blood work reassuring. UA with multiple squamous cells and too numerous to count WBCs, but suggestive of potential contamination. It is sent for culture, and she is not having any symptoms of UTI or kidney infection. Will not treat for now. Her small knot is ultrasounded, and shows a 0.6 cm hypoechoic lesion that is noncompressible and without vascular flow. This is suggestive of a mild cyst vs lymph node. No concerns for abscess or soft tissue infection. At this time given well appearance, no severe systemic signs or symptoms of illness will discharge with close PCP follow-up for re-evaluation. May be viral illness but discussed warning signs to return to ED for repeat evaluation. She expressed understanding of all discharge instructions and felt comfortable with the plan of care.   Final Clinical Impressions(s) / ED Diagnoses   Final diagnoses:  Febrile illness    New Prescriptions New Prescriptions   No medications on file     Forde Dandy, MD 01/13/17 2142

## 2017-01-15 LAB — URINE CULTURE

## 2017-01-17 DIAGNOSIS — R109 Unspecified abdominal pain: Secondary | ICD-10-CM | POA: Diagnosis not present

## 2017-01-17 DIAGNOSIS — R1900 Intra-abdominal and pelvic swelling, mass and lump, unspecified site: Secondary | ICD-10-CM | POA: Diagnosis not present

## 2017-01-21 DIAGNOSIS — E1065 Type 1 diabetes mellitus with hyperglycemia: Secondary | ICD-10-CM | POA: Diagnosis not present

## 2017-01-21 DIAGNOSIS — E109 Type 1 diabetes mellitus without complications: Secondary | ICD-10-CM | POA: Diagnosis not present

## 2017-01-28 DIAGNOSIS — M542 Cervicalgia: Secondary | ICD-10-CM | POA: Diagnosis not present

## 2017-01-28 DIAGNOSIS — M545 Low back pain: Secondary | ICD-10-CM | POA: Diagnosis not present

## 2017-01-28 DIAGNOSIS — Z79891 Long term (current) use of opiate analgesic: Secondary | ICD-10-CM | POA: Diagnosis not present

## 2017-01-31 DIAGNOSIS — R1900 Intra-abdominal and pelvic swelling, mass and lump, unspecified site: Secondary | ICD-10-CM | POA: Diagnosis not present

## 2017-01-31 DIAGNOSIS — R109 Unspecified abdominal pain: Secondary | ICD-10-CM | POA: Diagnosis not present

## 2017-01-31 DIAGNOSIS — Z9049 Acquired absence of other specified parts of digestive tract: Secondary | ICD-10-CM | POA: Diagnosis not present

## 2017-02-09 DIAGNOSIS — R109 Unspecified abdominal pain: Secondary | ICD-10-CM | POA: Diagnosis not present

## 2017-02-22 DIAGNOSIS — M25571 Pain in right ankle and joints of right foot: Secondary | ICD-10-CM | POA: Diagnosis not present

## 2017-02-22 DIAGNOSIS — S8991XA Unspecified injury of right lower leg, initial encounter: Secondary | ICD-10-CM | POA: Diagnosis not present

## 2017-02-22 DIAGNOSIS — S80811A Abrasion, right lower leg, initial encounter: Secondary | ICD-10-CM | POA: Diagnosis not present

## 2017-02-22 DIAGNOSIS — S8001XA Contusion of right knee, initial encounter: Secondary | ICD-10-CM | POA: Diagnosis not present

## 2017-02-22 DIAGNOSIS — Z23 Encounter for immunization: Secondary | ICD-10-CM | POA: Diagnosis not present

## 2017-02-22 DIAGNOSIS — I10 Essential (primary) hypertension: Secondary | ICD-10-CM | POA: Diagnosis not present

## 2017-02-22 DIAGNOSIS — S99911A Unspecified injury of right ankle, initial encounter: Secondary | ICD-10-CM | POA: Diagnosis not present

## 2017-02-22 DIAGNOSIS — S7001XA Contusion of right hip, initial encounter: Secondary | ICD-10-CM | POA: Diagnosis not present

## 2017-02-22 DIAGNOSIS — E109 Type 1 diabetes mellitus without complications: Secondary | ICD-10-CM | POA: Diagnosis not present

## 2017-02-22 DIAGNOSIS — W01198A Fall on same level from slipping, tripping and stumbling with subsequent striking against other object, initial encounter: Secondary | ICD-10-CM | POA: Diagnosis not present

## 2017-02-23 ENCOUNTER — Encounter: Payer: Self-pay | Admitting: Podiatry

## 2017-02-23 ENCOUNTER — Ambulatory Visit (INDEPENDENT_AMBULATORY_CARE_PROVIDER_SITE_OTHER): Payer: Medicare Other

## 2017-02-23 ENCOUNTER — Ambulatory Visit (INDEPENDENT_AMBULATORY_CARE_PROVIDER_SITE_OTHER): Payer: Medicare Other | Admitting: Podiatry

## 2017-02-23 DIAGNOSIS — Z9889 Other specified postprocedural states: Secondary | ICD-10-CM | POA: Diagnosis not present

## 2017-02-23 DIAGNOSIS — S8991XA Unspecified injury of right lower leg, initial encounter: Secondary | ICD-10-CM | POA: Diagnosis not present

## 2017-02-23 DIAGNOSIS — S99911A Unspecified injury of right ankle, initial encounter: Secondary | ICD-10-CM | POA: Diagnosis not present

## 2017-02-23 DIAGNOSIS — S7001XA Contusion of right hip, initial encounter: Secondary | ICD-10-CM | POA: Diagnosis not present

## 2017-02-23 DIAGNOSIS — M25571 Pain in right ankle and joints of right foot: Secondary | ICD-10-CM | POA: Diagnosis not present

## 2017-02-26 NOTE — Progress Notes (Signed)
   HPI: Patient presents today for three-month follow-up regarding bilateral bunionectomy procedures. Date of surgery 09/16/2016. Patient states that she's doing very well and has no complaints. She still experiences some intermittent stiffness to the bilateral great toes. Patient's otherwise no complaints.   Physical Exam: General: The patient is alert and oriented x3 in no acute distress.  Dermatology: Skin is warm, dry and supple bilateral lower extremities. Negative for open lesions or macerations.  Vascular: Palpable pedal pulses bilaterally. No edema or erythema noted. Capillary refill within normal limits.  Neurological: Epicritic and protective threshold grossly intact bilaterally.   Musculoskeletal Exam: There is some limited range of motion noted to the first MTPJ of the bilateral feet. This is secondary to likely scar tissue adhesions. Patient was unable to undergo a full month of physical therapy to improve range of motion. Range of motion within normal limits to all pedal and ankle joints bilateral. Muscle strength 5/5 in all groups bilateral.   Radiographic Exam:  Normal osseous mineralization. Joint spaces preserved. No fracture/dislocation/boney destruction. Stable osteotomy sites with orthopedic hardware intact.   Assessment: 1. Status post bilateral bunionectomy procedures. Date of surgery 09/16/2016   Plan of Care:  1. Patient was evaluated. 2. X-rays reviewed today 3. Prescription for meloxicam 15 mg to be used when necessary for periods a long-standing and foot fatigue 4. Return to clinic when necessary   Edrick Kins, DPM [facility]  Dr. Edrick Kins, DPM    [address], [place] [address]                Office [phone]  Fax [phone]

## 2017-03-08 DIAGNOSIS — M545 Low back pain: Secondary | ICD-10-CM | POA: Diagnosis not present

## 2017-03-08 DIAGNOSIS — Z79891 Long term (current) use of opiate analgesic: Secondary | ICD-10-CM | POA: Diagnosis not present

## 2017-03-08 DIAGNOSIS — M542 Cervicalgia: Secondary | ICD-10-CM | POA: Diagnosis not present

## 2017-03-08 DIAGNOSIS — M4726 Other spondylosis with radiculopathy, lumbar region: Secondary | ICD-10-CM | POA: Diagnosis not present

## 2017-03-23 ENCOUNTER — Telehealth: Payer: Self-pay | Admitting: Nutrition

## 2017-03-23 NOTE — Telephone Encounter (Signed)
Message left on machine to call me to get training on 630G pump

## 2017-03-30 ENCOUNTER — Telehealth: Payer: Self-pay | Admitting: Nutrition

## 2017-03-30 NOTE — Telephone Encounter (Signed)
Message left on machine to call for appt. For pump training.  Telephone number given

## 2017-04-08 DIAGNOSIS — J019 Acute sinusitis, unspecified: Secondary | ICD-10-CM | POA: Diagnosis not present

## 2017-04-08 DIAGNOSIS — K1120 Sialoadenitis, unspecified: Secondary | ICD-10-CM | POA: Diagnosis not present

## 2017-04-12 DIAGNOSIS — E109 Type 1 diabetes mellitus without complications: Secondary | ICD-10-CM | POA: Diagnosis not present

## 2017-04-12 DIAGNOSIS — E1065 Type 1 diabetes mellitus with hyperglycemia: Secondary | ICD-10-CM | POA: Diagnosis not present

## 2017-05-05 DIAGNOSIS — Z79891 Long term (current) use of opiate analgesic: Secondary | ICD-10-CM | POA: Diagnosis not present

## 2017-05-05 DIAGNOSIS — M545 Low back pain: Secondary | ICD-10-CM | POA: Diagnosis not present

## 2017-05-05 DIAGNOSIS — I10 Essential (primary) hypertension: Secondary | ICD-10-CM | POA: Diagnosis not present

## 2017-05-05 DIAGNOSIS — M542 Cervicalgia: Secondary | ICD-10-CM | POA: Diagnosis not present

## 2017-05-05 DIAGNOSIS — M5000 Cervical disc disorder with myelopathy, unspecified cervical region: Secondary | ICD-10-CM | POA: Diagnosis not present

## 2017-05-13 DIAGNOSIS — N951 Menopausal and female climacteric states: Secondary | ICD-10-CM | POA: Diagnosis not present

## 2017-05-13 DIAGNOSIS — M545 Low back pain: Secondary | ICD-10-CM | POA: Diagnosis not present

## 2017-05-13 DIAGNOSIS — Z720 Tobacco use: Secondary | ICD-10-CM | POA: Diagnosis not present

## 2017-05-13 DIAGNOSIS — I10 Essential (primary) hypertension: Secondary | ICD-10-CM | POA: Diagnosis not present

## 2017-05-13 DIAGNOSIS — E1065 Type 1 diabetes mellitus with hyperglycemia: Secondary | ICD-10-CM | POA: Diagnosis not present

## 2017-05-13 DIAGNOSIS — Z23 Encounter for immunization: Secondary | ICD-10-CM | POA: Diagnosis not present

## 2017-05-13 DIAGNOSIS — Z79891 Long term (current) use of opiate analgesic: Secondary | ICD-10-CM | POA: Diagnosis not present

## 2017-05-13 DIAGNOSIS — M542 Cervicalgia: Secondary | ICD-10-CM | POA: Diagnosis not present

## 2017-05-16 ENCOUNTER — Ambulatory Visit: Payer: Medicare Other | Admitting: Podiatry

## 2017-05-18 DIAGNOSIS — Z882 Allergy status to sulfonamides status: Secondary | ICD-10-CM | POA: Diagnosis not present

## 2017-05-18 DIAGNOSIS — E109 Type 1 diabetes mellitus without complications: Secondary | ICD-10-CM | POA: Diagnosis not present

## 2017-05-18 DIAGNOSIS — Z885 Allergy status to narcotic agent status: Secondary | ICD-10-CM | POA: Diagnosis not present

## 2017-05-18 DIAGNOSIS — Z881 Allergy status to other antibiotic agents status: Secondary | ICD-10-CM | POA: Diagnosis not present

## 2017-05-18 DIAGNOSIS — Z794 Long term (current) use of insulin: Secondary | ICD-10-CM | POA: Diagnosis not present

## 2017-05-18 DIAGNOSIS — Z880 Allergy status to penicillin: Secondary | ICD-10-CM | POA: Diagnosis not present

## 2017-05-18 DIAGNOSIS — Z79899 Other long term (current) drug therapy: Secondary | ICD-10-CM | POA: Diagnosis not present

## 2017-05-18 DIAGNOSIS — R1011 Right upper quadrant pain: Secondary | ICD-10-CM | POA: Diagnosis not present

## 2017-05-18 DIAGNOSIS — E7800 Pure hypercholesterolemia, unspecified: Secondary | ICD-10-CM | POA: Diagnosis not present

## 2017-05-18 DIAGNOSIS — N10 Acute tubulo-interstitial nephritis: Secondary | ICD-10-CM | POA: Diagnosis not present

## 2017-05-18 DIAGNOSIS — R109 Unspecified abdominal pain: Secondary | ICD-10-CM | POA: Diagnosis not present

## 2017-05-18 DIAGNOSIS — I10 Essential (primary) hypertension: Secondary | ICD-10-CM | POA: Diagnosis not present

## 2017-05-18 DIAGNOSIS — R1031 Right lower quadrant pain: Secondary | ICD-10-CM | POA: Diagnosis not present

## 2017-05-23 ENCOUNTER — Ambulatory Visit (INDEPENDENT_AMBULATORY_CARE_PROVIDER_SITE_OTHER): Payer: Medicare Other | Admitting: Podiatry

## 2017-05-23 ENCOUNTER — Encounter: Payer: Self-pay | Admitting: Podiatry

## 2017-05-23 ENCOUNTER — Ambulatory Visit (INDEPENDENT_AMBULATORY_CARE_PROVIDER_SITE_OTHER): Payer: Medicare Other

## 2017-05-23 DIAGNOSIS — M7751 Other enthesopathy of right foot: Secondary | ICD-10-CM | POA: Diagnosis not present

## 2017-05-23 DIAGNOSIS — M2010 Hallux valgus (acquired), unspecified foot: Secondary | ICD-10-CM | POA: Diagnosis not present

## 2017-05-23 DIAGNOSIS — M21619 Bunion of unspecified foot: Secondary | ICD-10-CM | POA: Diagnosis not present

## 2017-05-23 DIAGNOSIS — M7752 Other enthesopathy of left foot: Secondary | ICD-10-CM

## 2017-05-23 MED ORDER — DICLOFENAC SODIUM 75 MG PO TBEC: 75.0000 mg | DELAYED_RELEASE_TABLET | Freq: Two times a day (BID) | ORAL | 0 refills | Status: DC

## 2017-05-25 NOTE — Progress Notes (Signed)
   HPI: 46 year old female presenting today for pain to the incision sites of bilateral feet. She is status post bilateral bunionectomy procedures done on 09/16/16. She states her toes will not "pop" and feel irritated. She states the Meloxicam helped with swelling but she denies relief of the pain. She is here for further evaluation and treatment.   Past Medical History:  Diagnosis Date  . Abdominal wall mass of right flank 04/11/13  . Abrasion of leg with infection 02/18/14  . Anxiety   . Benign paroxysmal vertigo 09/04/13  . Bronchitis, acute 02/28/14  . Bunion of great toe 04/11/13  . Cervicalgia 06/13/14  . Depression   . Diabetes mellitus without complication ([place])   . Dorsalgia 05/17/14  . Dysuria 03/27/14  . Gastro-esophageal reflux disease without esophagitis 01/28/14  . Gastroenteritis 01/28/14  . GERD (gastroesophageal reflux disease)   . Hiatal hernia   . Hyperlipidemia   . Hypertension   . IBS (irritable bowel syndrome)   . Migraine headache   . Neuropathy   . Pain in thoracic spine 06/13/14  . 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 PE (physical exam), annual   . Peripheral neuropathy 11/05/13  . Subungual hematoma of digit of hand   . Tendonitis 03/07/13  . TIA (transient ischemic attack)   . Tobacco use   . Vertigo      Physical Exam: General: The patient is alert and oriented x3 in no acute distress.  Dermatology: Skin is warm, dry and supple bilateral lower extremities. Negative for open lesions or macerations.  Vascular: Palpable pedal pulses bilaterally. No edema or erythema noted. Capillary refill within normal limits.  Neurological: Epicritic and protective threshold grossly intact bilaterally.   Musculoskeletal Exam: Pain with palpation to the 1st MPJ of bilateral feet. Range of motion within normal limits to all pedal and ankle joints bilateral. Muscle strength 5/5 in all groups bilateral.   Radiographic Exam:  Normal osseous mineralization. Joint spaces  preserved. No fracture/dislocation/boney destruction.    Assessment: - 1st MPJ capsulitis bilaterally   Plan of Care:  - Patient evaluation. X-Rays reviewed. - Injection of 0.5 mLs Celestone Soluspan injected into the 1st MPJ of bilateral feet. - Recommended OTC insoles from [employer]. - Prescription for Diclofenac given to patient. - Return to clinic in 4 weeks.   Edrick Kins, DPM [facility]  Dr. Edrick Kins, DPM    [address], [place] [address]                Office [phone]  Fax [phone]

## 2017-05-27 MED ORDER — BETAMETHASONE SOD PHOS & ACET 6 (3-3) MG/ML IJ SUSP: 3.0000 mg | Freq: Once | INTRAMUSCULAR | Status: DC

## 2017-05-31 DIAGNOSIS — M7712 Lateral epicondylitis, left elbow: Secondary | ICD-10-CM | POA: Diagnosis not present

## 2017-06-02 ENCOUNTER — Telehealth: Payer: Self-pay | Admitting: Podiatry

## 2017-06-02 DIAGNOSIS — Z882 Allergy status to sulfonamides status: Secondary | ICD-10-CM | POA: Diagnosis not present

## 2017-06-02 DIAGNOSIS — E7800 Pure hypercholesterolemia, unspecified: Secondary | ICD-10-CM | POA: Diagnosis not present

## 2017-06-02 DIAGNOSIS — Z794 Long term (current) use of insulin: Secondary | ICD-10-CM | POA: Diagnosis not present

## 2017-06-02 DIAGNOSIS — Z79899 Other long term (current) drug therapy: Secondary | ICD-10-CM | POA: Diagnosis not present

## 2017-06-02 DIAGNOSIS — Z885 Allergy status to narcotic agent status: Secondary | ICD-10-CM | POA: Diagnosis not present

## 2017-06-02 DIAGNOSIS — Z880 Allergy status to penicillin: Secondary | ICD-10-CM | POA: Diagnosis not present

## 2017-06-02 DIAGNOSIS — E109 Type 1 diabetes mellitus without complications: Secondary | ICD-10-CM | POA: Diagnosis not present

## 2017-06-02 DIAGNOSIS — S93401A Sprain of unspecified ligament of right ankle, initial encounter: Secondary | ICD-10-CM | POA: Diagnosis not present

## 2017-06-02 DIAGNOSIS — M7989 Other specified soft tissue disorders: Secondary | ICD-10-CM | POA: Diagnosis not present

## 2017-06-02 DIAGNOSIS — I10 Essential (primary) hypertension: Secondary | ICD-10-CM | POA: Diagnosis not present

## 2017-06-02 DIAGNOSIS — W01198A Fall on same level from slipping, tripping and stumbling with subsequent striking against other object, initial encounter: Secondary | ICD-10-CM | POA: Diagnosis not present

## 2017-06-02 DIAGNOSIS — Z881 Allergy status to other antibiotic agents status: Secondary | ICD-10-CM | POA: Diagnosis not present

## 2017-06-02 NOTE — Telephone Encounter (Signed)
Pt calling to see if she could get a rx for diabetic shoes. She has a pharmacy that dispenses diabetic shoes. [facility]. Fax # is [phone]. Phone number is [phone]

## 2017-06-06 ENCOUNTER — Ambulatory Visit (INDEPENDENT_AMBULATORY_CARE_PROVIDER_SITE_OTHER): Payer: Medicare Other

## 2017-06-06 ENCOUNTER — Ambulatory Visit (INDEPENDENT_AMBULATORY_CARE_PROVIDER_SITE_OTHER): Payer: Medicare Other | Admitting: Podiatry

## 2017-06-06 DIAGNOSIS — M79671 Pain in right foot: Secondary | ICD-10-CM | POA: Diagnosis not present

## 2017-06-06 DIAGNOSIS — M25571 Pain in right ankle and joints of right foot: Secondary | ICD-10-CM

## 2017-06-06 DIAGNOSIS — S93401A Sprain of unspecified ligament of right ankle, initial encounter: Secondary | ICD-10-CM | POA: Diagnosis not present

## 2017-06-06 DIAGNOSIS — IMO0001 Reserved for inherently not codable concepts without codable children: Secondary | ICD-10-CM

## 2017-06-06 NOTE — Telephone Encounter (Signed)
Yes, please provide Rx for Diabetic shoes with plastizote insoles. Thanks

## 2017-06-09 NOTE — Progress Notes (Signed)
   Subjective:  Patient presents today for throbbing pain and tenderness to the right ankle and foot that began four days ago secondary to falling down 3-4 stairs. She reports associated swelling of the area. Patient relates significant pain and tenderness when walking. She denies alleviating factors. Patient presents for further treatment and evaluation.   Past Medical History:  Diagnosis Date  . Abdominal wall mass of right flank 04/11/13  . Abrasion of leg with infection 02/18/14  . Anxiety   . Benign paroxysmal vertigo 09/04/13  . Bronchitis, acute 02/28/14  . Bunion of great toe 04/11/13  . Cervicalgia 06/13/14  . Depression   . Diabetes mellitus without complication ([place])   . Dorsalgia 05/17/14  . Dysuria 03/27/14  . Gastro-esophageal reflux disease without esophagitis 01/28/14  . Gastroenteritis 01/28/14  . GERD (gastroesophageal reflux disease)   . Hiatal hernia   . Hyperlipidemia   . Hypertension   . IBS (irritable bowel syndrome)   . Migraine headache   . Neuropathy   . Pain in thoracic spine 06/13/14  . 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 PE (physical exam), annual   . Peripheral neuropathy 11/05/13  . Subungual hematoma of digit of hand   . Tendonitis 03/07/13  . TIA (transient ischemic attack)   . Tobacco use   . Vertigo        Objective / Physical Exam:  General:  The patient is alert and oriented x3 in no acute distress. Dermatology:  Skin is warm, dry and supple bilateral lower extremities. Negative for open lesions or macerations. Vascular:  Palpable pedal pulses bilaterally. No edema or erythema noted. Capillary refill within normal limits. Neurological:  Epicritic and protective threshold grossly intact bilaterally.  Musculoskeletal Exam:  Pain on palpation to the lateral aspect of the patient's right ankle. Mild edema noted. Range of motion within normal limits to all pedal and ankle joints bilateral. Muscle strength 5/5 in all groups bilateral.    Radiographic Exam:  Normal osseous mineralization. Joint spaces preserved. No fracture/dislocation/boney destruction.    Assessment: #1 pain in right ankle #2 right ankle sprain  Plan of Care:  #1 Patient was evaluated. X-Rays reviewed. #2 CAM boot dispensed. #3 compression anklet dispensed. #4 continue taking oral meloxicam 15 mg as directed. #5 Return to clinic in 4 weeks.  Edrick Kins, DPM [facility]  Dr. Edrick Kins, [address]                                        [place], [place] [address]                Office [phone]  Fax [phone]

## 2017-06-16 DIAGNOSIS — J019 Acute sinusitis, unspecified: Secondary | ICD-10-CM | POA: Diagnosis not present

## 2017-06-16 DIAGNOSIS — K1120 Sialoadenitis, unspecified: Secondary | ICD-10-CM | POA: Diagnosis not present

## 2017-06-20 ENCOUNTER — Ambulatory Visit (INDEPENDENT_AMBULATORY_CARE_PROVIDER_SITE_OTHER): Payer: Medicare Other | Admitting: Podiatry

## 2017-06-20 ENCOUNTER — Encounter: Payer: Self-pay | Admitting: Podiatry

## 2017-06-20 DIAGNOSIS — S93401D Sprain of unspecified ligament of right ankle, subsequent encounter: Secondary | ICD-10-CM

## 2017-06-20 DIAGNOSIS — IMO0001 Reserved for inherently not codable concepts without codable children: Secondary | ICD-10-CM

## 2017-06-26 NOTE — Progress Notes (Signed)
   Subjective:  Patient presents today for follow up evaluation of right ankle pain secondary to an ankle sprain. She reports inflammation to the bilateral great toes as well as significant pain. She states the pain medication is not helping alleviate the symptoms. She has been wearing compression socks on the right foot which seem to be helping. Patient presents for further treatment and evaluation.   Past Medical History:  Diagnosis Date  . Abdominal wall mass of right flank 04/11/13  . Abrasion of leg with infection 02/18/14  . Anxiety   . Benign paroxysmal vertigo 09/04/13  . Bronchitis, acute 02/28/14  . Bunion of great toe 04/11/13  . Cervicalgia 06/13/14  . Depression   . Diabetes mellitus without complication ([place])   . Dorsalgia 05/17/14  . Dysuria 03/27/14  . Gastro-esophageal reflux disease without esophagitis 01/28/14  . Gastroenteritis 01/28/14  . GERD (gastroesophageal reflux disease)   . Hiatal hernia   . Hyperlipidemia   . Hypertension   . IBS (irritable bowel syndrome)   . Migraine headache   . Neuropathy   . Pain in thoracic spine 06/13/14  . 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 PE (physical exam), annual   . Peripheral neuropathy 11/05/13  . Subungual hematoma of digit of hand   . Tendonitis 03/07/13  . TIA (transient ischemic attack)   . Tobacco use   . Vertigo        Objective / Physical Exam:  General:  The patient is alert and oriented x3 in no acute distress. Dermatology:  Skin is warm, dry and supple bilateral lower extremities. Negative for open lesions or macerations. Vascular:  Palpable pedal pulses bilaterally. No edema or erythema noted. Capillary refill within normal limits. Neurological:  Epicritic and protective threshold grossly intact bilaterally.  Musculoskeletal Exam:  Pain on palpation to the lateral aspect of the patient's right ankle. Mild edema noted. Range of motion within normal limits to all pedal and ankle joints bilateral. Muscle  strength 5/5 in all groups bilateral.     Assessment: #1 Right ankle sprain - improved #2 h/o bilateral bunionectomies   Plan of Care:  #1 Patient was evaluated.  #2 Discontinue wearing CAM boot. #3 Recommended good sneakers. #4 Recommended OTC insoles from [employer].  #5 Return to clinic when necessary.   Edrick Kins, DPM [facility]  Dr. Edrick Kins, [address]                                        [place], [place] [address]                Office [phone]  Fax [phone]

## 2017-07-04 ENCOUNTER — Ambulatory Visit: Payer: Medicare Other | Admitting: Podiatry

## 2017-07-04 DIAGNOSIS — E1065 Type 1 diabetes mellitus with hyperglycemia: Secondary | ICD-10-CM | POA: Diagnosis not present

## 2017-07-04 DIAGNOSIS — M5412 Radiculopathy, cervical region: Secondary | ICD-10-CM | POA: Diagnosis not present

## 2017-07-04 DIAGNOSIS — Z79891 Long term (current) use of opiate analgesic: Secondary | ICD-10-CM | POA: Diagnosis not present

## 2017-07-04 DIAGNOSIS — M545 Low back pain: Secondary | ICD-10-CM | POA: Diagnosis not present

## 2017-07-04 DIAGNOSIS — E109 Type 1 diabetes mellitus without complications: Secondary | ICD-10-CM | POA: Diagnosis not present

## 2017-07-04 DIAGNOSIS — M542 Cervicalgia: Secondary | ICD-10-CM | POA: Diagnosis not present

## 2017-07-18 DIAGNOSIS — Z01419 Encounter for gynecological examination (general) (routine) without abnormal findings: Secondary | ICD-10-CM | POA: Diagnosis not present

## 2017-08-08 ENCOUNTER — Encounter: Payer: Self-pay | Admitting: Podiatry

## 2017-08-08 ENCOUNTER — Ambulatory Visit (INDEPENDENT_AMBULATORY_CARE_PROVIDER_SITE_OTHER): Payer: Medicare Other

## 2017-08-08 ENCOUNTER — Telehealth: Payer: Self-pay | Admitting: *Deleted

## 2017-08-08 ENCOUNTER — Ambulatory Visit (INDEPENDENT_AMBULATORY_CARE_PROVIDER_SITE_OTHER): Payer: Medicare Other | Admitting: Podiatry

## 2017-08-08 DIAGNOSIS — M7752 Other enthesopathy of left foot: Secondary | ICD-10-CM | POA: Diagnosis not present

## 2017-08-08 DIAGNOSIS — M7751 Other enthesopathy of right foot: Secondary | ICD-10-CM

## 2017-08-08 DIAGNOSIS — M659 Synovitis and tenosynovitis, unspecified: Secondary | ICD-10-CM

## 2017-08-08 DIAGNOSIS — M2010 Hallux valgus (acquired), unspecified foot: Secondary | ICD-10-CM

## 2017-08-08 DIAGNOSIS — M65971 Unspecified synovitis and tenosynovitis, right ankle and foot: Secondary | ICD-10-CM

## 2017-08-08 DIAGNOSIS — S93401D Sprain of unspecified ligament of right ankle, subsequent encounter: Secondary | ICD-10-CM

## 2017-08-08 NOTE — Telephone Encounter (Signed)
Orders to J. Quintana, RN for pre-cert and faxed to  [facility]. 

## 2017-08-08 NOTE — Telephone Encounter (Signed)
-----   Message from Edrick Kins, DPM sent at 08/08/2017  3:43 PM EST ----- Regarding: MRI right ankle Please order MRI of right ankle with or without contrast.  Diagnosis: Severe ankle sprain right  Thanks, Dr. Amalia Hailey

## 2017-08-10 NOTE — Progress Notes (Signed)
   Subjective:  Patient presents today for follow up evaluation of right ankle pain secondary to an ankle sprain.  She states the ankle pain has been worse for the past month.  She reports continued pain to the bilateral great toes with associated swelling.  Patient presents for further treatment and evaluation.   Past Medical History:  Diagnosis Date  . Abdominal wall mass of right flank 04/11/13  . Abrasion of leg with infection 02/18/14  . Anxiety   . Benign paroxysmal vertigo 09/04/13  . Bronchitis, acute 02/28/14  . Bunion of great toe 04/11/13  . Cervicalgia 06/13/14  . Depression   . Diabetes mellitus without complication ([place])   . Dorsalgia 05/17/14  . Dysuria 03/27/14  . Gastro-esophageal reflux disease without esophagitis 01/28/14  . Gastroenteritis 01/28/14  . GERD (gastroesophageal reflux disease)   . Hiatal hernia   . Hyperlipidemia   . Hypertension   . IBS (irritable bowel syndrome)   . Migraine headache   . Neuropathy   . Pain in thoracic spine 06/13/14  . 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 PE (physical exam), annual   . Peripheral neuropathy 11/05/13  . Subungual hematoma of digit of hand   . Tendonitis 03/07/13  . TIA (transient ischemic attack)   . Tobacco use   . Vertigo        Objective / Physical Exam:  General:  The patient is alert and oriented x3 in no acute distress. Dermatology:  Skin is warm, dry and supple bilateral lower extremities. Negative for open lesions or macerations. Vascular:  Palpable pedal pulses bilaterally. No edema or erythema noted. Capillary refill within normal limits. Neurological:  Epicritic and protective threshold grossly intact bilaterally.  Musculoskeletal Exam:  Pain on palpation to the lateral aspect of the patient's right ankle as well as bilateral first MPJs. Mild edema noted. Range of motion within normal limits to all pedal and ankle joints bilateral. Muscle strength 5/5 in all groups bilateral.    Radiographic  Exam:  Normal osseous mineralization. Joint spaces preserved. No fracture/dislocation/boney destruction.     Assessment: #1 First MPJ capsulitis bilateral #2 right ankle synovitis secondary to ankle sprain  Plan of Care:  #1 Patient was evaluated.  X-rays reviewed. #2 Injection of 0.5 mLs Celestone Soluspan injected into bilateral first MPJs. #3 order for MRI of the right ankle placed today. #4 continue wearing DM shoes. #5 return to clinic in 4 weeks to review MRI results.  Edrick Kins, DPM [facility]  Dr. Edrick Kins, [address]                                        [place], [place] [address]                Office [phone]  Fax [phone]

## 2017-08-15 ENCOUNTER — Other Ambulatory Visit: Payer: Self-pay

## 2017-08-15 ENCOUNTER — Emergency Department (HOSPITAL_COMMUNITY)
Admission: EM | Admit: 2017-08-15 | Discharge: 2017-08-15 | Disposition: A | Discharge status: Home / Self Care | Payer: Medicare Other | Attending: Emergency Medicine | Admitting: Emergency Medicine

## 2017-08-15 ENCOUNTER — Encounter (HOSPITAL_COMMUNITY): Payer: Self-pay | Admitting: Emergency Medicine

## 2017-08-15 DIAGNOSIS — E1165 Type 2 diabetes mellitus with hyperglycemia: Secondary | ICD-10-CM | POA: Diagnosis not present

## 2017-08-15 DIAGNOSIS — R51 Headache: Secondary | ICD-10-CM | POA: Diagnosis not present

## 2017-08-15 DIAGNOSIS — F17210 Nicotine dependence, cigarettes, uncomplicated: Secondary | ICD-10-CM | POA: Insufficient documentation

## 2017-08-15 DIAGNOSIS — I10 Essential (primary) hypertension: Secondary | ICD-10-CM | POA: Insufficient documentation

## 2017-08-15 DIAGNOSIS — E1040 Type 1 diabetes mellitus with diabetic neuropathy, unspecified: Secondary | ICD-10-CM | POA: Insufficient documentation

## 2017-08-15 DIAGNOSIS — R110 Nausea: Secondary | ICD-10-CM | POA: Diagnosis not present

## 2017-08-15 DIAGNOSIS — E1065 Type 1 diabetes mellitus with hyperglycemia: Secondary | ICD-10-CM | POA: Diagnosis not present

## 2017-08-15 DIAGNOSIS — Z79899 Other long term (current) drug therapy: Secondary | ICD-10-CM | POA: Diagnosis not present

## 2017-08-15 DIAGNOSIS — R519 Headache, unspecified: Secondary | ICD-10-CM

## 2017-08-15 DIAGNOSIS — Z8673 Personal history of transient ischemic attack (TIA), and cerebral infarction without residual deficits: Secondary | ICD-10-CM | POA: Diagnosis not present

## 2017-08-15 DIAGNOSIS — R739 Hyperglycemia, unspecified: Secondary | ICD-10-CM

## 2017-08-15 LAB — CBC WITH DIFFERENTIAL/PLATELET
Basophils Absolute: 0 10*3/uL (ref 0.0–0.1)
Basophils Relative: 1 %
Eosinophils Absolute: 0.2 10*3/uL (ref 0.0–0.7)
Eosinophils Relative: 3 %
HCT: 42.3 % (ref 36.0–46.0)
HEMOGLOBIN: 14.4 g/dL (ref 12.0–15.0)
LYMPHS ABS: 2.4 10*3/uL (ref 0.7–4.0)
LYMPHS PCT: 32 %
MCH: 29.9 pg (ref 26.0–34.0)
MCHC: 34 g/dL (ref 30.0–36.0)
MCV: 87.9 fL (ref 78.0–100.0)
Monocytes Absolute: 0.5 10*3/uL (ref 0.1–1.0)
Monocytes Relative: 7 %
NEUTROS ABS: 4.3 10*3/uL (ref 1.7–7.7)
NEUTROS PCT: 57 %
Platelets: 250 10*3/uL (ref 150–400)
RBC: 4.81 MIL/uL (ref 3.87–5.11)
RDW: 12.3 % (ref 11.5–15.5)
WBC: 7.5 10*3/uL (ref 4.0–10.5)

## 2017-08-15 LAB — CBG MONITORING, ED
GLUCOSE-CAPILLARY: 275 mg/dL — AB (ref 65–99)
GLUCOSE-CAPILLARY: 250 mg/dL — AB (ref 65–99)
Glucose-Capillary: 325 mg/dL — ABNORMAL HIGH (ref 65–99)

## 2017-08-15 LAB — URINALYSIS, ROUTINE W REFLEX MICROSCOPIC
BILIRUBIN URINE: NEGATIVE
Glucose, UA: >=500 mg/dL — AB
HGB URINE DIPSTICK: NEGATIVE
KETONES UR: NEGATIVE mg/dL
LEUKOCYTES UA: NEGATIVE
NITRITE: POSITIVE — AB
PH: 5 (ref 5.0–8.0)
Protein, ur: NEGATIVE mg/dL
RBC / HPF: NONE SEEN RBC/hpf (ref 0–5)
SPECIFIC GRAVITY, URINE: 1.014 (ref 1.005–1.030)

## 2017-08-15 LAB — BASIC METABOLIC PANEL
ANION GAP: 11 (ref 5–15)
BUN: 18 mg/dL (ref 6–20)
CO2: 29 mmol/L (ref 22–32)
Calcium: 9.3 mg/dL (ref 8.9–10.3)
Chloride: 94 mmol/L — ABNORMAL LOW (ref 101–111)
Creatinine, Ser: 0.9 mg/dL (ref 0.44–1.00)
GFR calc Af Amer: >60 mL/min (ref >60)
GLUCOSE: 290 mg/dL — AB (ref 65–99)
POTASSIUM: 3.9 mmol/L (ref 3.5–5.1)
SODIUM: 134 mmol/L — AB (ref 135–145)

## 2017-08-15 MED ORDER — SODIUM CHLORIDE 0.9 % IV BOLUS (SEPSIS)
1000.0000 mL | Freq: Once | INTRAVENOUS | Status: AC
  Administered 2017-08-15: 1000 mL via INTRAVENOUS

## 2017-08-15 MED ORDER — INSULIN ASPART 100 UNIT/ML ~~LOC~~ SOLN
3.0000 [IU] | Freq: Once | SUBCUTANEOUS | Status: AC
  Administered 2017-08-15: 3 [IU] via SUBCUTANEOUS
  Filled 2017-08-15: qty 1

## 2017-08-15 MED ORDER — KETOROLAC TROMETHAMINE 30 MG/ML IJ SOLN
30.0000 mg | Freq: Once | INTRAMUSCULAR | Status: AC
  Administered 2017-08-15: 30 mg via INTRAVENOUS
  Filled 2017-08-15: qty 1

## 2017-08-15 MED ORDER — METOCLOPRAMIDE HCL 5 MG/ML IJ SOLN
10.0000 mg | Freq: Once | INTRAMUSCULAR | Status: AC
  Administered 2017-08-15: 10 mg via INTRAVENOUS
  Filled 2017-08-15: qty 2

## 2017-08-15 MED ORDER — DIPHENHYDRAMINE HCL 50 MG/ML IJ SOLN
25.0000 mg | Freq: Once | INTRAMUSCULAR | Status: AC
  Administered 2017-08-15: 25 mg via INTRAVENOUS
  Filled 2017-08-15: qty 1

## 2017-08-15 NOTE — ED Triage Notes (Addendum)
Pt c/o dizziness over past few days, headache and cant get sugar below 600 at home. nad at this time. Denies trouble swallowing or slurred speech or weakness on one side more than the other

## 2017-08-15 NOTE — ED Provider Notes (Signed)
[facility] EMERGENCY DEPARTMENT Provider Note   CSN: [id number] Arrival date & time: 08/15/17  1104     History   Chief Complaint Chief Complaint  Patient presents with  . Hyperglycemia    HPI Ilaria Much is a 47 y.o. female.  Chief complaint is high blood sugars.  HPI 47 year old female.  History of type 1 diabetes since age 25.  She is currently 76.  She states for about 4-5 days she has had a unusually high blood sugars at home.  They have been as high as 600.  Is not currently on insulin pump.  Take 24 units of Lantus in the morning.  Takes 3 times daily Humalog with meals and per sliding scale.  Last dose was 5 units for blood sugar of 400 and this was 2 hours ago.  No other unusual symptoms.  Is not been ill.  No nausea or other symptoms until today she developed a headache and some nausea.  This is not unusual for her when her sugars are high.  Distant history of migraine headaches.  No sudden onset or thunderclap type headaches.  No fever.  No chest pain or shortness of breath.  No dysuria or burning.  She does have some urinary frequency yesterday and today again, not uncommon with hyperglycemia for her.  Past Medical History:  Diagnosis Date  . Abdominal wall mass of right flank 04/11/13  . Abrasion of leg with infection 02/18/14  . Anxiety   . Benign paroxysmal vertigo 09/04/13  . Bronchitis, acute 02/28/14  . Bunion of great toe 04/11/13  . Cervicalgia 06/13/14  . Depression   . Diabetes mellitus without complication ([place])   . Dorsalgia 05/17/14  . Dysuria 03/27/14  . Gastro-esophageal reflux disease without esophagitis 01/28/14  . Gastroenteritis 01/28/14  . GERD (gastroesophageal reflux disease)   . Hiatal hernia   . Hyperlipidemia   . Hypertension   . IBS (irritable bowel syndrome)   . Migraine headache   . Neuropathy   . Pain in thoracic spine 06/13/14  . 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 PE (physical exam), annual   . Peripheral neuropathy 11/05/13   . Subungual hematoma of digit of hand   . Tendonitis 03/07/13  . TIA (transient ischemic attack)   . Tobacco use   . Vertigo     Patient Active Problem List   Diagnosis Date Noted  . Chest pain 03/25/2015  . Hypertension   . Hyperlipidemia   . Anxiety   . Pain in the chest   . Essential hypertension   . Poorly controlled type 1 diabetes mellitus with peripheral neuropathy ([place]) 10/28/2014  . TIA (transient ischemic attack) 08/17/2014  . Tobacco abuse   . Gastro-esophageal reflux disease without esophagitis 01/28/2014    Past Surgical History:  Procedure Laterality Date  . ABDOMINAL HYSTERECTOMY    . APPENDECTOMY    . CARDIAC CATHETERIZATION   last in 2009   x 4, normal coronary arteries  . CARDIAC CATHETERIZATION N/A 03/26/2015   Procedure: Left Heart Cath and Coronary Angiography;  Surgeon: Wellington Hampshire, MD;  Location: [place] [facility];  Service: Cardiovascular;  Laterality: N/A;  . CESAREAN SECTION    . CHOLECYSTECTOMY    . CYST EXCISION     right breast  . TUBAL LIGATION      OB History    No data available       Home Medications    Prior to Admission medications   Medication  Sig Start Date End Date Taking? Authorizing Provider  Calcium Polycarbophil (FIBERCON PO) Take 1 tablet by mouth every morning.    Yes [provider]  cholecalciferol (VITAMIN D) 1000 units tablet Take 1,000 Units by mouth every morning.    Yes [provider]  cyclobenzaprine (FLEXERIL) 10 MG tablet Take 10 mg by mouth at bedtime.  01/12/17  Yes [provider]  diazepam (VALIUM) 10 MG tablet Take 5 mg by mouth every 6 (six) hours.  05/13/16  Yes [provider]  docusate sodium (COLACE) 100 MG capsule Take 100 mg by mouth 2 (two) times daily.   Yes [provider]  DULoxetine (CYMBALTA) 60 MG capsule Take 60 mg by mouth daily.  09/04/15  Yes [provider]  EPIPEN 2-PAK 0.3 MG/0.3ML SOAJ injection Inject 0.3 mg into the muscle  once.  02/13/16  Yes [provider]  escitalopram (LEXAPRO) 20 MG tablet Take 20 mg by mouth daily.    Yes [provider]  estradiol (ESTRACE) 1 MG tablet Take 1 mg by mouth daily.   Yes [provider]  Insulin Glargine (LANTUS SOLOSTAR) 100 UNIT/ML Solostar Pen Inject 24 Units into the skin daily before breakfast. Patient taking differently: Inject 25 Units into the skin daily before breakfast.  04/08/15  Yes Philemon Kingdom, MD  insulin lispro (HUMALOG KWIKPEN) 100 UNIT/ML KiwkPen Inject 10 Units into the skin 3 (three) times daily with meals. + SLIDING SCALE   Yes [provider]  lisinopril (PRINIVIL,ZESTRIL) 5 MG tablet Take 5 mg by mouth daily. 06/10/15  Yes [provider]  LYRICA 150 MG capsule Take 150 mg by mouth 2 (two) times daily.  08/19/15  Yes [provider]  Melatonin 5 MG TABS Take 1 tablet by mouth at bedtime as needed and may repeat dose one time if needed.   Yes [provider]  Multiple Vitamin (MULTIVITAMIN WITH MINERALS) TABS Take 1 tablet by mouth every morning.   Yes [provider]  Omega-3 Fatty Acids (FISH OIL) 1200 MG CPDR Take 1 capsule by mouth daily.    Yes [provider]  oxyCODONE-acetaminophen (PERCOCET) 10-325 MG tablet Take 1 tablet by mouth every 4 (four) hours.  09/16/16  Yes Edrick Kins, DPM  pravastatin (PRAVACHOL) 10 MG tablet Take 10 mg by mouth at bedtime. 05/29/15  Yes [provider]  CHANTIX STARTING MONTH PAK 0.5 MG X 11 & 1 MG X 42 tablet Take 1 tablet by mouth 2 (two) times daily. 07/28/17   [provider]  diclofenac (VOLTAREN) 75 MG EC tablet Take 1 tablet (75 mg total) by mouth 2 (two) times daily. Patient not taking: Reported on 08/15/2017 05/23/17   Edrick Kins, DPM  glucose blood (BAYER CONTOUR NEXT TEST) test strip Use to test blood sugar 4-6 times daily as instructed. Dx: E10.40 10/14/16   Philemon Kingdom, MD  [facility] DELICA LANCETS  FINE MISC Use to test blood sugar 4-6 times daily as instructed. Dx: E10.40 11/07/14   Philemon Kingdom, MD    Family History Family History  Problem Relation Age of Onset  . Stroke Mother 67  . Heart attack Mother 86  . Cancer Sister        colon  . Heart failure Maternal Grandmother 65  . Cancer Maternal Grandfather        prostate  . Heart failure Paternal Grandmother 44  . Heart failure Paternal Grandfather 37    Social History Social History  Tobacco Use  . Smoking status: Current Every Day Smoker    Packs/day: 0.50    Years: 11.00    Pack years: 5.50    Types: Cigarettes  . Smokeless tobacco: Never Used  . Tobacco comment: patient is aware that she needs to quit smoking  Substance Use Topics  . Alcohol use: No    Alcohol/week: 0.0 oz  . Drug use: No     Allergies   Zocor [simvastatin]; Clindamycin/lincomycin; Codeine; Penicillins; and Sulfa antibiotics   Review of Systems Review of Systems  Constitutional: Negative for appetite change, chills, diaphoresis, fatigue and fever.  HENT: Negative for mouth sores, sore throat and trouble swallowing.   Eyes: Negative for visual disturbance.  Respiratory: Negative for cough, chest tightness, shortness of breath and wheezing.   Cardiovascular: Negative for chest pain.  Gastrointestinal: Positive for nausea. Negative for abdominal distention, abdominal pain, diarrhea and vomiting.  Endocrine: Positive for polydipsia and polyuria. Negative for polyphagia.  Genitourinary: Negative for dysuria, frequency and hematuria.  Musculoskeletal: Negative for gait problem.  Skin: Negative for color change, pallor and rash.  Neurological: Positive for dizziness and headaches. Negative for syncope and light-headedness.  Hematological: Does not bruise/bleed easily.  Psychiatric/Behavioral: Negative for behavioral problems and confusion.     Physical Exam Updated Vital Signs BP 104/76   Pulse 95   Temp 98.3 F (36.8 C) (Oral)    Resp 19   Ht 5\' 7"  (1.702 m)   Wt 72.6 kg (160 lb)   SpO2 95%   BMI 25.06 kg/m   Physical Exam  Constitutional: She is oriented to person, place, and time. She appears well-developed and well-nourished. No distress.  Awake and alert.  Does not ill.  Mucus membranes are not dry.  HENT:  Head: Normocephalic.  Eyes: Conjunctivae are normal. Pupils are equal, round, and reactive to light. No scleral icterus.  Neck: Normal range of motion. Neck supple. No thyromegaly present.  Cardiovascular: Normal rate and regular rhythm. Exam reveals no gallop and no friction rub.  No murmur heard. Pulmonary/Chest: Effort normal and breath sounds normal. No respiratory distress. She has no wheezes. She has no rales.  Abdominal: Soft. Bowel sounds are normal. She exhibits no distension. There is no tenderness. There is no rebound.  Musculoskeletal: Normal range of motion.  Neurological: She is alert and oriented to person, place, and time.  Skin: Skin is warm and dry. No rash noted.  Psychiatric: She has a normal mood and affect. Her behavior is normal.     ED Treatments / Results  Labs (all labs ordered are listed, but only abnormal results are displayed) Labs Reviewed  BASIC METABOLIC PANEL - Abnormal; Notable for the following components:      Result Value   Sodium 134 (*)    Chloride 94 (*)    Glucose, Bld 290 (*)    All other components within normal limits  URINALYSIS, ROUTINE W REFLEX MICROSCOPIC - Abnormal; Notable for the following components:   Glucose, UA >=500 (*)    Nitrite POSITIVE (*)    Bacteria, UA FEW (*)    Squamous Epithelial / LPF 0-5 (*)    All other components within normal limits  CBG MONITORING, ED - Abnormal; Notable for the following components:   Glucose-Capillary 275 (*)    All other components within normal limits  CBG MONITORING, ED - Abnormal; Notable for the following components:   Glucose-Capillary 325 (*)    All other components within normal limits  CBG MONITORING, ED - Abnormal; Notable for the following components:   Glucose-Capillary 250 (*)    All other components within normal limits  URINE CULTURE  CBC WITH DIFFERENTIAL/PLATELET    EKG  EKG Interpretation None       Radiology No results found.  Procedures Procedures (including critical care time)  Medications Ordered in ED Medications  sodium chloride 0.9 % bolus 1,000 mL (0 mLs Intravenous Stopped 08/15/17 1752)  diphenhydrAMINE (BENADRYL) injection 25 mg (25 mg Intravenous Given 08/15/17 1713)  metoCLOPramide (REGLAN) injection 10 mg (10 mg Intravenous Given 08/15/17 1714)  ketorolac (TORADOL) 30 MG/ML injection 30 mg (30 mg Intravenous Given 08/15/17 1714)  insulin aspart (novoLOG) injection 3 Units (3 Units Subcutaneous Given 08/15/17 1714)  sodium chloride 0.9 % bolus 1,000 mL (1,000 mLs Intravenous New Bag/Given 08/15/17 1753)     Initial Impression / Assessment and Plan / ED Course  I have reviewed the triage vital signs and the nursing notes.  Pertinent labs & imaging results that were available during my care of the patient were reviewed by me and considered in my medical decision making (see chart for details).    Reassuring labs.  Sodium 134.  Renal function normal creatinine 0.9.  BUN 18.  Not acidotic, definitely not in DKA.  Urine is clear with specific gravity 1.014.  High glucose.  Positive nitrates but no white blood cells.  She is symptomatic only to the polyuria.  She is given IV fluids.  Was given subcu insulin 3 units which would be consistent with her sliding scale at home her blood sugar is improved to 250.  Given Reglan, low-dose Toradol, Benadryl and her headache has improved as has her nausea.  We discussed the nitrates in her urine.  She is not symptomatic enough and does not have enough findings that I would consider empiric treatment.  We will culture urine and contact her if positive.  We will continue to have her use her sliding scale.   Push fluids, sliding scale insulin, primary care if not improving.  ER with acute changes.   Final Clinical Impressions(s) / ED Diagnoses   Final diagnoses:  Hyperglycemia  Acute nonintractable headache, unspecified headache type    ED Discharge Orders    None       Tanna Furry, MD 08/15/17 [phone]

## 2017-08-15 NOTE — Discharge Instructions (Signed)
Push fluids to stay hydrated. If your Urine culture shows infection, we will contact you. We will continue using her sliding scale insulin.

## 2017-08-18 LAB — URINE CULTURE: Culture: >=100000 — AB

## 2017-08-19 ENCOUNTER — Telehealth: Payer: Self-pay | Admitting: *Deleted

## 2017-08-19 NOTE — Telephone Encounter (Signed)
Post ED Visit - Positive Culture Follow-up: Successful Patient Follow-Up  Culture assessed and recommendations reviewed by: []  Elenor Quinones, Pharm.D. []  Heide Guile, Pharm.D., BCPS AQ-ID []  Parks Neptune, Pharm.D., BCPS []  Alycia Rossetti, Pharm.D., BCPS []  [place], Pharm.D., BCPS, AAHIVP []  Legrand Como, Pharm.D., BCPS, AAHIVP []  Salome Arnt, PharmD, BCPS []  Dimitri Ped, PharmD, BCPS []  Vincenza Hews, PharmD, BCPS  Positive urine culture; patient remains symptomatic  [x]  Patient discharged without antimicrobial prescription and treatment is now indicated []  Organism is resistant to prescribed ED discharge antimicrobial []  Patient with positive blood cultures  Changes discussed with ED provider:  Alyse Low, PA-C New antibiotic prescription Macrobid 100mg  PO BID x 5 days Called to [facility], [place]  Contacted patient, date 08/19/2017, time [place], [place] 08/19/2017, 10:04 AM

## 2017-08-19 NOTE — Progress Notes (Signed)
ED Antimicrobial Stewardship Positive Culture Follow Up   [name] is an 47 y.o. female who presented to [facility] on 08/15/2017 with a chief complaint of  Chief Complaint  Patient presents with  . Hyperglycemia    Recent Results (from the past 720 hour(s))  Urine Culture     Status: Abnormal   Collection Time: 08/15/17  3:20 PM  Result Value Ref Range Status   Specimen Description URINE, CLEAN CATCH  Final   Special Requests NONE  Final   Culture >=100,000 COLONIES/mL ESCHERICHIA COLI (A)  Final   Report Status 08/18/2017 FINAL  Final   Organism ID, Bacteria ESCHERICHIA COLI (A)  Final      Susceptibility   Escherichia coli - MIC*    AMPICILLIN 4 SENSITIVE Sensitive     CEFAZOLIN <=4 SENSITIVE Sensitive     CEFTRIAXONE <=1 SENSITIVE Sensitive     CIPROFLOXACIN >=4 RESISTANT Resistant     GENTAMICIN <=1 SENSITIVE Sensitive     IMIPENEM <=0.25 SENSITIVE Sensitive     NITROFURANTOIN <=16 SENSITIVE Sensitive     TRIMETH/SULFA <=20 SENSITIVE Sensitive     AMPICILLIN/SULBACTAM <=2 SENSITIVE Sensitive     PIP/TAZO <=4 SENSITIVE Sensitive     Extended ESBL NEGATIVE Sensitive     * >=100,000 COLONIES/mL ESCHERICHIA COLI    New antibiotic prescription: If patient is symptomatic with dysuria, frequency then treat with macrobid 100mg  PO BID x 5 days.  If patient is asymptomatic then no treatment is indicated at this time.   ED Provider: Alyse Low, PA-C   Jalene Mullet, Pharm.D. PGY1 Pharmacy Resident 08/19/2017 9:48 AM Main Pharmacy: [phone]  Phone# [phone]

## 2017-08-23 ENCOUNTER — Inpatient Hospital Stay: Admission: RE | Admit: 2017-08-23 | Payer: Medicare Other | Source: Ambulatory Visit

## 2017-08-29 ENCOUNTER — Ambulatory Visit
Admission: RE | Admit: 2017-08-29 | Discharge: 2017-08-29 | Disposition: A | Discharge status: Home / Self Care | Payer: Medicare Other | Source: Ambulatory Visit | Attending: Podiatry | Admitting: Podiatry

## 2017-08-29 DIAGNOSIS — S93491A Sprain of other ligament of right ankle, initial encounter: Secondary | ICD-10-CM | POA: Diagnosis not present

## 2017-08-29 DIAGNOSIS — M659 Synovitis and tenosynovitis, unspecified: Secondary | ICD-10-CM

## 2017-08-29 DIAGNOSIS — S93401D Sprain of unspecified ligament of right ankle, subsequent encounter: Secondary | ICD-10-CM

## 2017-08-29 DIAGNOSIS — M7751 Other enthesopathy of right foot: Secondary | ICD-10-CM

## 2017-08-31 ENCOUNTER — Telehealth: Payer: Self-pay | Admitting: Podiatry

## 2017-08-31 DIAGNOSIS — M5412 Radiculopathy, cervical region: Secondary | ICD-10-CM | POA: Diagnosis not present

## 2017-08-31 DIAGNOSIS — M542 Cervicalgia: Secondary | ICD-10-CM | POA: Diagnosis not present

## 2017-08-31 DIAGNOSIS — M545 Low back pain: Secondary | ICD-10-CM | POA: Diagnosis not present

## 2017-08-31 DIAGNOSIS — Z79891 Long term (current) use of opiate analgesic: Secondary | ICD-10-CM | POA: Diagnosis not present

## 2017-08-31 DIAGNOSIS — S93401S Sprain of unspecified ligament of right ankle, sequela: Secondary | ICD-10-CM

## 2017-08-31 NOTE — Telephone Encounter (Signed)
Please inform the patient that she sustained a severe ankle sprain.  No fracture or tear identified on MRI.  She needs to continue weightbearing in the immobilization cam boot. Patient would also benefit from physical therapy.  Please order physical therapy 3 times per week times 4 weeks for the patient. Diagnosis: Severe ankle sprain right  Thanks, Dr. Amalia Hailey

## 2017-08-31 NOTE — Telephone Encounter (Signed)
I had an MRI done on my right foot on Monday. I was calling to see if you all had gotten the results yet. You can reach me at [phone]. Thank you and have a blessed day.

## 2017-09-01 NOTE — Addendum Note (Signed)
Addended by: Harriett Sine D on: 09/01/2017 02:33 PM   Modules accepted: Orders

## 2017-09-06 DIAGNOSIS — E109 Type 1 diabetes mellitus without complications: Secondary | ICD-10-CM | POA: Diagnosis not present

## 2017-09-06 DIAGNOSIS — Z209 Contact with and (suspected) exposure to unspecified communicable disease: Secondary | ICD-10-CM | POA: Diagnosis not present

## 2017-09-06 DIAGNOSIS — E782 Mixed hyperlipidemia: Secondary | ICD-10-CM | POA: Diagnosis not present

## 2017-09-12 DIAGNOSIS — E7800 Pure hypercholesterolemia, unspecified: Secondary | ICD-10-CM | POA: Diagnosis not present

## 2017-09-12 DIAGNOSIS — M79604 Pain in right leg: Secondary | ICD-10-CM | POA: Diagnosis not present

## 2017-09-12 DIAGNOSIS — S0083XA Contusion of other part of head, initial encounter: Secondary | ICD-10-CM | POA: Diagnosis not present

## 2017-09-12 DIAGNOSIS — S60811A Abrasion of right wrist, initial encounter: Secondary | ICD-10-CM | POA: Diagnosis not present

## 2017-09-12 DIAGNOSIS — I10 Essential (primary) hypertension: Secondary | ICD-10-CM | POA: Diagnosis not present

## 2017-09-12 DIAGNOSIS — Z794 Long term (current) use of insulin: Secondary | ICD-10-CM | POA: Diagnosis not present

## 2017-09-12 DIAGNOSIS — M25531 Pain in right wrist: Secondary | ICD-10-CM | POA: Diagnosis not present

## 2017-09-12 DIAGNOSIS — T148XXA Other injury of unspecified body region, initial encounter: Secondary | ICD-10-CM | POA: Diagnosis not present

## 2017-09-12 DIAGNOSIS — S61512A Laceration without foreign body of left wrist, initial encounter: Secondary | ICD-10-CM | POA: Diagnosis not present

## 2017-09-12 DIAGNOSIS — E1040 Type 1 diabetes mellitus with diabetic neuropathy, unspecified: Secondary | ICD-10-CM | POA: Diagnosis not present

## 2017-09-12 DIAGNOSIS — S8001XA Contusion of right knee, initial encounter: Secondary | ICD-10-CM | POA: Diagnosis not present

## 2017-09-12 DIAGNOSIS — M542 Cervicalgia: Secondary | ICD-10-CM | POA: Diagnosis not present

## 2017-09-12 DIAGNOSIS — M25551 Pain in right hip: Secondary | ICD-10-CM | POA: Diagnosis not present

## 2017-09-14 ENCOUNTER — Emergency Department (HOSPITAL_COMMUNITY)
Admission: EM | Admit: 2017-09-14 | Discharge: 2017-09-14 | Disposition: A | Discharge status: Home / Self Care | Payer: Medicare Other | Attending: Emergency Medicine | Admitting: Emergency Medicine

## 2017-09-14 ENCOUNTER — Other Ambulatory Visit: Payer: Self-pay

## 2017-09-14 ENCOUNTER — Encounter (HOSPITAL_COMMUNITY): Payer: Self-pay

## 2017-09-14 DIAGNOSIS — Z5321 Procedure and treatment not carried out due to patient leaving prior to being seen by health care provider: Secondary | ICD-10-CM | POA: Diagnosis not present

## 2017-09-14 DIAGNOSIS — R109 Unspecified abdominal pain: Secondary | ICD-10-CM | POA: Diagnosis not present

## 2017-09-14 DIAGNOSIS — M79604 Pain in right leg: Secondary | ICD-10-CM | POA: Diagnosis not present

## 2017-09-14 DIAGNOSIS — M542 Cervicalgia: Secondary | ICD-10-CM | POA: Diagnosis present

## 2017-09-14 NOTE — ED Notes (Signed)
No answer in lobby when called for room °
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
## 2017-09-14 NOTE — ED Triage Notes (Signed)
No answer,  When called name in waiting area.

## 2017-09-14 NOTE — ED Notes (Signed)
No answer in lobby when call for room.

## 2017-09-15 DIAGNOSIS — E1040 Type 1 diabetes mellitus with diabetic neuropathy, unspecified: Secondary | ICD-10-CM | POA: Diagnosis not present

## 2017-09-15 DIAGNOSIS — R1030 Lower abdominal pain, unspecified: Secondary | ICD-10-CM | POA: Diagnosis not present

## 2017-09-15 DIAGNOSIS — Z79899 Other long term (current) drug therapy: Secondary | ICD-10-CM | POA: Diagnosis not present

## 2017-09-15 DIAGNOSIS — I10 Essential (primary) hypertension: Secondary | ICD-10-CM | POA: Diagnosis not present

## 2017-09-15 DIAGNOSIS — Z794 Long term (current) use of insulin: Secondary | ICD-10-CM | POA: Diagnosis not present

## 2017-09-15 DIAGNOSIS — M25551 Pain in right hip: Secondary | ICD-10-CM | POA: Diagnosis not present

## 2017-09-15 DIAGNOSIS — K5900 Constipation, unspecified: Secondary | ICD-10-CM | POA: Diagnosis not present

## 2017-09-15 DIAGNOSIS — Z87891 Personal history of nicotine dependence: Secondary | ICD-10-CM | POA: Diagnosis not present

## 2017-09-15 DIAGNOSIS — S7001XA Contusion of right hip, initial encounter: Secondary | ICD-10-CM | POA: Diagnosis not present

## 2017-09-15 DIAGNOSIS — E7800 Pure hypercholesterolemia, unspecified: Secondary | ICD-10-CM | POA: Diagnosis not present

## 2017-09-23 DIAGNOSIS — E10319 Type 1 diabetes mellitus with unspecified diabetic retinopathy without macular edema: Secondary | ICD-10-CM | POA: Diagnosis not present

## 2017-09-23 DIAGNOSIS — H5213 Myopia, bilateral: Secondary | ICD-10-CM | POA: Diagnosis not present

## 2017-09-23 LAB — HM DIABETES EYE EXAM

## 2017-09-28 DIAGNOSIS — M79641 Pain in right hand: Secondary | ICD-10-CM | POA: Diagnosis not present

## 2017-09-28 DIAGNOSIS — M25522 Pain in left elbow: Secondary | ICD-10-CM | POA: Diagnosis not present

## 2017-10-10 DIAGNOSIS — R1013 Epigastric pain: Secondary | ICD-10-CM | POA: Diagnosis not present

## 2017-10-10 DIAGNOSIS — K8050 Calculus of bile duct without cholangitis or cholecystitis without obstruction: Secondary | ICD-10-CM | POA: Diagnosis not present

## 2017-10-10 DIAGNOSIS — E1065 Type 1 diabetes mellitus with hyperglycemia: Secondary | ICD-10-CM | POA: Diagnosis not present

## 2017-10-10 DIAGNOSIS — I10 Essential (primary) hypertension: Secondary | ICD-10-CM | POA: Diagnosis not present

## 2017-10-10 DIAGNOSIS — E118 Type 2 diabetes mellitus with unspecified complications: Secondary | ICD-10-CM | POA: Diagnosis not present

## 2017-10-10 DIAGNOSIS — K5900 Constipation, unspecified: Secondary | ICD-10-CM | POA: Diagnosis not present

## 2017-10-10 DIAGNOSIS — R110 Nausea: Secondary | ICD-10-CM | POA: Diagnosis not present

## 2017-10-10 DIAGNOSIS — Z79899 Other long term (current) drug therapy: Secondary | ICD-10-CM | POA: Diagnosis not present

## 2017-10-10 DIAGNOSIS — Z794 Long term (current) use of insulin: Secondary | ICD-10-CM | POA: Diagnosis not present

## 2017-10-10 DIAGNOSIS — F329 Major depressive disorder, single episode, unspecified: Secondary | ICD-10-CM | POA: Diagnosis not present

## 2017-10-10 DIAGNOSIS — R109 Unspecified abdominal pain: Secondary | ICD-10-CM | POA: Diagnosis not present

## 2017-10-12 DIAGNOSIS — K5900 Constipation, unspecified: Secondary | ICD-10-CM | POA: Diagnosis not present

## 2017-10-12 DIAGNOSIS — F419 Anxiety disorder, unspecified: Secondary | ICD-10-CM | POA: Diagnosis not present

## 2017-10-12 DIAGNOSIS — F329 Major depressive disorder, single episode, unspecified: Secondary | ICD-10-CM | POA: Diagnosis not present

## 2017-10-12 DIAGNOSIS — K219 Gastro-esophageal reflux disease without esophagitis: Secondary | ICD-10-CM | POA: Diagnosis not present

## 2017-10-12 DIAGNOSIS — E1065 Type 1 diabetes mellitus with hyperglycemia: Secondary | ICD-10-CM | POA: Diagnosis not present

## 2017-10-12 DIAGNOSIS — R109 Unspecified abdominal pain: Secondary | ICD-10-CM | POA: Diagnosis not present

## 2017-10-27 DIAGNOSIS — F349 Persistent mood [affective] disorder, unspecified: Secondary | ICD-10-CM | POA: Diagnosis not present

## 2017-10-27 DIAGNOSIS — M545 Low back pain: Secondary | ICD-10-CM | POA: Diagnosis not present

## 2017-10-27 DIAGNOSIS — M542 Cervicalgia: Secondary | ICD-10-CM | POA: Diagnosis not present

## 2017-10-27 DIAGNOSIS — M5412 Radiculopathy, cervical region: Secondary | ICD-10-CM | POA: Diagnosis not present

## 2017-10-27 DIAGNOSIS — Z79891 Long term (current) use of opiate analgesic: Secondary | ICD-10-CM | POA: Diagnosis not present

## 2017-10-28 ENCOUNTER — Ambulatory Visit (HOSPITAL_COMMUNITY)
Admission: RE | Admit: 2017-10-28 | Discharge: 2017-10-28 | Disposition: A | Discharge status: Home / Self Care | Payer: Medicare HMO | Source: Ambulatory Visit | Attending: Neurology | Admitting: Neurology

## 2017-10-28 ENCOUNTER — Other Ambulatory Visit (HOSPITAL_COMMUNITY): Payer: Self-pay | Admitting: Neurology

## 2017-10-28 DIAGNOSIS — W19XXXA Unspecified fall, initial encounter: Secondary | ICD-10-CM | POA: Insufficient documentation

## 2017-10-28 DIAGNOSIS — M4722 Other spondylosis with radiculopathy, cervical region: Secondary | ICD-10-CM | POA: Diagnosis not present

## 2017-10-28 DIAGNOSIS — S299XXA Unspecified injury of thorax, initial encounter: Secondary | ICD-10-CM | POA: Diagnosis not present

## 2017-10-28 DIAGNOSIS — M542 Cervicalgia: Secondary | ICD-10-CM | POA: Diagnosis not present

## 2017-10-28 DIAGNOSIS — M62830 Muscle spasm of back: Secondary | ICD-10-CM | POA: Insufficient documentation

## 2017-10-28 DIAGNOSIS — M5412 Radiculopathy, cervical region: Secondary | ICD-10-CM | POA: Diagnosis present

## 2017-10-28 DIAGNOSIS — S3992XA Unspecified injury of lower back, initial encounter: Secondary | ICD-10-CM | POA: Diagnosis not present

## 2017-10-28 DIAGNOSIS — S199XXA Unspecified injury of neck, initial encounter: Secondary | ICD-10-CM | POA: Diagnosis not present

## 2017-10-28 DIAGNOSIS — M545 Low back pain: Secondary | ICD-10-CM | POA: Diagnosis not present

## 2017-11-08 ENCOUNTER — Encounter (INDEPENDENT_AMBULATORY_CARE_PROVIDER_SITE_OTHER): Payer: Self-pay | Admitting: Internal Medicine

## 2017-11-08 ENCOUNTER — Ambulatory Visit (INDEPENDENT_AMBULATORY_CARE_PROVIDER_SITE_OTHER): Payer: Medicare HMO | Admitting: Internal Medicine

## 2017-11-08 VITALS — BP 130/82 | HR 80 | Temp 98.2°F | Ht 67.0 in | Wt 173.6 lb

## 2017-11-08 DIAGNOSIS — E119 Type 2 diabetes mellitus without complications: Secondary | ICD-10-CM

## 2017-11-08 DIAGNOSIS — Z800 Family history of malignant neoplasm of digestive organs: Secondary | ICD-10-CM | POA: Diagnosis not present

## 2017-11-08 NOTE — Progress Notes (Signed)
Subjective:    Patient ID: [name], female    DOB: 02/25/1971, 47 y.o.   MRN: [id number]  HPI Referred by Dr. Nadara Mustard for constipation. She says her lat colonoscopy was 5 yrs ago by [place] at [facility]. Father has Crohn's disease. She had a sister age 16 with colon cancer now deceased. She take Colace BID for her constipation.  She has a BM about twice a week and they are hard.  No BRRB or melena. Her appetite is not good. She has gained about 20 pounds in a 2 month period.  She is trying to quit smoking.    Type 1 Diabetic since age 22.  She is disabled.  Review of Systems Past Medical History:  Diagnosis Date  . Abdominal wall mass of right flank 04/11/13  . Abrasion of leg with infection 02/18/14  . Anxiety   . Benign paroxysmal vertigo 09/04/13  . Bronchitis, acute 02/28/14  . Bunion of great toe 04/11/13  . Cervicalgia 06/13/14  . Depression   . Diabetes ([place]) 11/08/2017  . Diabetes mellitus without complication ([place])   . Dorsalgia 05/17/14  . Dysuria 03/27/14  . Gastro-esophageal reflux disease without esophagitis 01/28/14  . Gastroenteritis 01/28/14  . GERD (gastroesophageal reflux disease)   . Hiatal hernia   . Hyperlipidemia   . Hypertension   . IBS (irritable bowel syndrome)   . Migraine headache   . Neuropathy   . Pain in thoracic spine 06/13/14  . 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 PE (physical exam), annual   . Peripheral neuropathy 11/05/13  . Subungual hematoma of digit of hand   . Tendonitis 03/07/13  . TIA (transient ischemic attack)   . Tobacco use   . Vertigo     Past Surgical History:  Procedure Laterality Date  . ABDOMINAL HYSTERECTOMY    . APPENDECTOMY    . CARDIAC CATHETERIZATION   last in 2009   x 4, normal coronary arteries  . CARDIAC CATHETERIZATION N/A 03/26/2015   Procedure: Left Heart Cath and Coronary Angiography;  Surgeon: Wellington Hampshire, MD;  Location: [place] [facility];  Service: Cardiovascular;  Laterality: N/A;  .  CESAREAN SECTION    . CHOLECYSTECTOMY    . CYST EXCISION     right breast  . TUBAL LIGATION      Allergies  Allergen Reactions  . Zocor [Simvastatin] Other (See Comments)    Muscle aches and pains  . Clindamycin/Lincomycin Rash  . Codeine Rash  . Penicillins Rash    Has patient had a PCN reaction causing immediate rash, facial/tongue/throat swelling, SOB or lightheadedness with hypotension: Yes Has patient had a PCN reaction causing severe rash involving mucus membranes or skin necrosis: Yes Has patient had a PCN reaction that required hospitalization: Yes Has patient had a PCN reaction occurring within the last 10 years: No If all of the above answers are "NO", then may proceed with Cephalosporin use.   . Sulfa Antibiotics Rash         Current Outpatient Medications on File Prior to Visit  Medication Sig Dispense Refill  . buPROPion (ZYBAN) 150 MG 12 hr tablet Take 150 mg by mouth daily.    . cyclobenzaprine (FLEXERIL) 10 MG tablet Take 10 mg by mouth as needed.   0  . docusate sodium (COLACE) 100 MG capsule Take 100 mg by mouth 2 (two) times daily.    Marland Kitchen docusate sodium (COLACE) 100 MG capsule Take 100 mg by mouth 2 (two)  times daily.    Marland Kitchen EPIPEN 2-PAK 0.3 MG/0.3ML SOAJ injection Inject 0.3 mg into the muscle once.     Marland Kitchen estradiol (ESTRACE) 1 MG tablet Take 1 mg by mouth daily.    . Insulin Glargine (LANTUS SOLOSTAR) 100 UNIT/ML Solostar Pen Inject 24 Units into the skin daily before breakfast. (Patient taking differently: Inject 25 Units into the skin daily before breakfast. ) 5 pen 2  . insulin lispro (HUMALOG KWIKPEN) 100 UNIT/ML KiwkPen Inject 10 Units into the skin 3 (three) times daily with meals. + SLIDING SCALE    . lisinopril (PRINIVIL,ZESTRIL) 5 MG tablet Take 5 mg by mouth daily.  2  . LYRICA 150 MG capsule Take 150 mg by mouth 2 (two) times daily.     . Melatonin 10 MG CAPS Take 5 mg by mouth.    . Melatonin 5 MG TABS Take 1 tablet by mouth at bedtime as needed and  may repeat dose one time if needed.    . Multiple Vitamin (MULTIVITAMIN WITH MINERALS) TABS Take 1 tablet by mouth every morning.    . Omega-3 Fatty Acids (FISH OIL) 1200 MG CPDR Take 1 capsule by mouth daily.     Marland Kitchen omeprazole (PRILOSEC) 40 MG capsule Take 40 mg by mouth as needed.    Glory Rosebush DELICA LANCETS FINE MISC Use to test blood sugar 4-6 times daily as instructed. Dx: E10.40 600 each 3  . pravastatin (PRAVACHOL) 10 MG tablet Take 10 mg by mouth at bedtime.  2  . pregabalin (LYRICA) 150 MG capsule Take 150 mg by mouth 2 (two) times daily.    . psyllium (REGULOID) 0.52 g capsule Take 0.52 g by mouth daily.    . vitamin B-12 (CYANOCOBALAMIN) 500 MCG tablet Take 500 mcg by mouth daily.     Current Facility-Administered Medications on File Prior to Visit  Medication Dose Route Frequency Provider Last Rate Last Dose  . betamethasone acetate-betamethasone sodium phosphate (CELESTONE) injection 3 mg  3 mg Intramuscular Once Edrick Kins, DPM            Objective:   Physical Exam Blood pressure 130/82, pulse 80, temperature 98.2 F (36.8 C), height 5\' 7"  (1.702 m), weight 173 lb 9.6 oz (78.7 kg). Alert and oriented. Skin warm and dry. Oral mucosa is moist.   . Sclera anicteric, conjunctivae is pink. Thyroid not enlarged. No cervical lymphadenopathy. Lungs clear. Heart regular rate and rhythm.  Abdomen is soft. Bowel sounds are positive. No hepatomegaly. No abdominal masses felt. No tenderness.  No edema to lower extremities.           Assessment & Plan:  Family hx of colon cancer in a sister.  Her last colonoscopy was 5 yrs ago. She needs surveillance. Continue the Colace.  Will get last colonoscopy from [facility].

## 2017-11-08 NOTE — Patient Instructions (Signed)
The risks of bleeding, perforation and infection were reviewed with patient.  

## 2017-11-09 ENCOUNTER — Encounter: Payer: Medicare Other | Admitting: Podiatry

## 2017-11-10 ENCOUNTER — Encounter (INDEPENDENT_AMBULATORY_CARE_PROVIDER_SITE_OTHER): Payer: Self-pay | Admitting: *Deleted

## 2017-11-10 ENCOUNTER — Telehealth (INDEPENDENT_AMBULATORY_CARE_PROVIDER_SITE_OTHER): Payer: Self-pay | Admitting: *Deleted

## 2017-11-10 DIAGNOSIS — Z800 Family history of malignant neoplasm of digestive organs: Secondary | ICD-10-CM | POA: Insufficient documentation

## 2017-11-10 MED ORDER — PEG 3350-KCL-NA BICARB-NACL 420 G PO SOLR: 4000.0000 mL | Freq: Once | ORAL | 0 refills | Status: AC

## 2017-11-10 NOTE — Telephone Encounter (Signed)
Patient needs trilyte 

## 2017-11-16 NOTE — Progress Notes (Signed)
This encounter was created in error - please disregard.
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
## 2017-12-01 ENCOUNTER — Ambulatory Visit (HOSPITAL_COMMUNITY): Payer: Medicare Other | Admitting: Psychiatry

## 2017-12-08 ENCOUNTER — Ambulatory Visit (INDEPENDENT_AMBULATORY_CARE_PROVIDER_SITE_OTHER): Payer: Medicare HMO | Admitting: Internal Medicine

## 2017-12-08 ENCOUNTER — Encounter (INDEPENDENT_AMBULATORY_CARE_PROVIDER_SITE_OTHER): Payer: Self-pay | Admitting: Internal Medicine

## 2017-12-08 VITALS — BP 128/82 | HR 72 | Temp 98.5°F | Ht 67.0 in | Wt 173.8 lb

## 2017-12-08 DIAGNOSIS — K5909 Other constipation: Secondary | ICD-10-CM | POA: Diagnosis not present

## 2017-12-08 NOTE — Progress Notes (Signed)
Subjective:    Patient ID: [name], female    DOB: Jul 29, 1971, 47 y.o.   MRN: [id number]  HPI Here today for f/u. Hx of chronic constipation. She is scheduled for a colonoscopy in June of this year.  Family hx of colon cancer in a sister age 8 who is now deceased.  She has not had a BM in 2 1/2 weeks. She has tried [employer]ister[employer], enemas x 2 and OTC and has not had a BM.  She has tenderness in her upper abdomen. This is the first time she has every gone this long. She says she does not have the urge to have a BM at this time.   Review of Systems Past Medical History:  Diagnosis Date  . Abdominal wall mass of right flank 04/11/13  . Abrasion of leg with infection 02/18/14  . Anxiety   . Benign paroxysmal vertigo 09/04/13  . Bronchitis, acute 02/28/14  . Bunion of great toe 04/11/13  . Cervicalgia 06/13/14  . Depression   . Diabetes ([place]) 11/08/2017  . Diabetes mellitus without complication ([place])   . Dorsalgia 05/17/14  . Dysuria 03/27/14  . Gastro-esophageal reflux disease without esophagitis 01/28/14  . Gastroenteritis 01/28/14  . GERD (gastroesophageal reflux disease)   . Hiatal hernia   . Hyperlipidemia   . Hypertension   . IBS (irritable bowel syndrome)   . Migraine headache   . Neuropathy   . Pain in thoracic spine 06/13/14  . 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 PE (physical exam), annual   . Peripheral neuropathy 11/05/13  . Subungual hematoma of digit of hand   . Tendonitis 03/07/13  . TIA (transient ischemic attack)   . Tobacco use   . Vertigo     Past Surgical History:  Procedure Laterality Date  . ABDOMINAL HYSTERECTOMY    . APPENDECTOMY    . CARDIAC CATHETERIZATION   last in 2009   x 4, normal coronary arteries  . CARDIAC CATHETERIZATION N/A 03/26/2015   Procedure: Left Heart Cath and Coronary Angiography;  Surgeon: Wellington Hampshire, MD;  Location: [facility];  Service: Cardiovascular;  Laterality: N/A;  . CESAREAN SECTION    . CHOLECYSTECTOMY      . CYST EXCISION     right breast  . TUBAL LIGATION      Allergies  Allergen Reactions  . Zocor [Simvastatin] Other (See Comments)    Muscle aches and pains  . Clindamycin/Lincomycin Rash  . Codeine Rash  . Penicillins Rash    Has patient had a PCN reaction causing immediate rash, facial/tongue/throat swelling, SOB or lightheadedness with hypotension: Yes Has patient had a PCN reaction causing severe rash involving mucus membranes or skin necrosis: Yes Has patient had a PCN reaction that required hospitalization: Yes Has patient had a PCN reaction occurring within the last 10 years: No If all of the above answers are "NO", then may proceed with Cephalosporin use.   . Sulfa Antibiotics Rash         Current Outpatient Medications on File Prior to Visit  Medication Sig Dispense Refill  . buPROPion (ZYBAN) 150 MG 12 hr tablet Take 150 mg by mouth daily.    . cyclobenzaprine (FLEXERIL) 10 MG tablet Take 10 mg by mouth as needed.   0  . docusate sodium (COLACE) 100 MG capsule Take 100 mg by mouth 2 (two) times daily.    Marland Kitchen EPIPEN 2-PAK 0.3 MG/0.3ML SOAJ injection Inject 0.3 mg into the muscle once.     Marland Kitchen  estradiol (ESTRACE) 1 MG tablet Take 1 mg by mouth daily.    . Insulin Glargine (LANTUS SOLOSTAR) 100 UNIT/ML Solostar Pen Inject 24 Units into the skin daily before breakfast. (Patient taking differently: Inject 25 Units into the skin daily before breakfast. ) 5 pen 2  . insulin lispro (HUMALOG KWIKPEN) 100 UNIT/ML KiwkPen Inject 10 Units into the skin 3 (three) times daily with meals. + SLIDING SCALE    . lisinopril (PRINIVIL,ZESTRIL) 5 MG tablet Take 5 mg by mouth daily.  2  . LYRICA 150 MG capsule Take 150 mg by mouth 2 (two) times daily.     . Melatonin 10 MG CAPS Take 5 mg by mouth.    . Melatonin 5 MG TABS Take 1 tablet by mouth at bedtime as needed and may repeat dose one time if needed.    . Multiple Vitamin (MULTIVITAMIN WITH MINERALS) TABS Take 1 tablet by mouth every  morning.    . Omega-3 Fatty Acids (FISH OIL) 1200 MG CPDR Take 1 capsule by mouth daily.     Marland Kitchen omeprazole (PRILOSEC) 40 MG capsule Take 40 mg by mouth as needed.    Glory Rosebush DELICA LANCETS FINE MISC Use to test blood sugar 4-6 times daily as instructed. Dx: E10.40 600 each 3  . pravastatin (PRAVACHOL) 10 MG tablet Take 10 mg by mouth at bedtime.  2  . pregabalin (LYRICA) 150 MG capsule Take 150 mg by mouth 2 (two) times daily.    . psyllium (REGULOID) 0.52 g capsule Take 0.52 g by mouth daily.    . vitamin B-12 (CYANOCOBALAMIN) 500 MCG tablet Take 500 mcg by mouth daily.     Current Facility-Administered Medications on File Prior to Visit  Medication Dose Route Frequency Provider Last Rate Last Dose  . betamethasone acetate-betamethasone sodium phosphate (CELESTONE) injection 3 mg  3 mg Intramuscular Once Edrick Kins, DPM            Objective:   Physical Exam Blood pressure 128/82, pulse 72, temperature 98.5 F (36.9 C), height 5\' 7"  (1.702 m), weight 173 lb 12.8 oz (78.8 kg). Alert and oriented. Skin warm and dry. Oral mucosa is moist.   . Sclera anicteric, conjunctivae is pink. Thyroid not enlarged. No cervical lymphadenopathy. Lungs clear. Heart regular rate and rhythm.  Abdomen is soft. Bowel sounds are positive. No hepatomegaly. No abdominal masses felt. No tenderness.  No edema to lower extremities.  No stool in rectum.          Assessment & Plan:  Constipation.  Samples of Linzess x 4 bottles given to patient. Drink a bottle of Mag Citrate today. Hold the enema for at least 30 minutes. Call me in the morning with a PR report.

## 2017-12-08 NOTE — Patient Instructions (Signed)
Samples of Linzess 266mcg Get an enema and Mag citrate today

## 2017-12-09 ENCOUNTER — Encounter (HOSPITAL_COMMUNITY): Payer: Self-pay | Admitting: Emergency Medicine

## 2017-12-09 ENCOUNTER — Other Ambulatory Visit: Payer: Self-pay

## 2017-12-09 ENCOUNTER — Telehealth (INDEPENDENT_AMBULATORY_CARE_PROVIDER_SITE_OTHER): Payer: Self-pay | Admitting: *Deleted

## 2017-12-09 ENCOUNTER — Emergency Department (HOSPITAL_COMMUNITY): Payer: Medicare HMO

## 2017-12-09 ENCOUNTER — Emergency Department (HOSPITAL_COMMUNITY)
Admission: EM | Admit: 2017-12-09 | Discharge: 2017-12-09 | Disposition: A | Discharge status: Home / Self Care | Payer: Medicare HMO | Attending: Emergency Medicine | Admitting: Emergency Medicine

## 2017-12-09 DIAGNOSIS — K5900 Constipation, unspecified: Secondary | ICD-10-CM | POA: Diagnosis not present

## 2017-12-09 DIAGNOSIS — E109 Type 1 diabetes mellitus without complications: Secondary | ICD-10-CM | POA: Diagnosis not present

## 2017-12-09 DIAGNOSIS — Z9049 Acquired absence of other specified parts of digestive tract: Secondary | ICD-10-CM | POA: Insufficient documentation

## 2017-12-09 DIAGNOSIS — F17210 Nicotine dependence, cigarettes, uncomplicated: Secondary | ICD-10-CM | POA: Insufficient documentation

## 2017-12-09 DIAGNOSIS — F419 Anxiety disorder, unspecified: Secondary | ICD-10-CM | POA: Insufficient documentation

## 2017-12-09 DIAGNOSIS — I10 Essential (primary) hypertension: Secondary | ICD-10-CM | POA: Diagnosis not present

## 2017-12-09 DIAGNOSIS — Z79899 Other long term (current) drug therapy: Secondary | ICD-10-CM | POA: Insufficient documentation

## 2017-12-09 DIAGNOSIS — Z794 Long term (current) use of insulin: Secondary | ICD-10-CM | POA: Diagnosis not present

## 2017-12-09 DIAGNOSIS — R110 Nausea: Secondary | ICD-10-CM | POA: Diagnosis not present

## 2017-12-09 DIAGNOSIS — Z8673 Personal history of transient ischemic attack (TIA), and cerebral infarction without residual deficits: Secondary | ICD-10-CM | POA: Insufficient documentation

## 2017-12-09 DIAGNOSIS — F329 Major depressive disorder, single episode, unspecified: Secondary | ICD-10-CM | POA: Insufficient documentation

## 2017-12-09 LAB — COMPREHENSIVE METABOLIC PANEL
ALK PHOS: 74 U/L (ref 38–126)
ALT: 20 U/L (ref 14–54)
ANION GAP: 7 (ref 5–15)
AST: 27 U/L (ref 15–41)
Albumin: 3.8 g/dL (ref 3.5–5.0)
BILIRUBIN TOTAL: 0.6 mg/dL (ref 0.3–1.2)
BUN: 9 mg/dL (ref 6–20)
CALCIUM: 9 mg/dL (ref 8.9–10.3)
CO2: 28 mmol/L (ref 22–32)
Chloride: 101 mmol/L (ref 101–111)
Creatinine, Ser: 0.65 mg/dL (ref 0.44–1.00)
Glucose, Bld: 189 mg/dL — ABNORMAL HIGH (ref 65–99)
POTASSIUM: 3.5 mmol/L (ref 3.5–5.1)
Sodium: 136 mmol/L (ref 135–145)
TOTAL PROTEIN: 6.6 g/dL (ref 6.5–8.1)

## 2017-12-09 LAB — CBC WITH DIFFERENTIAL/PLATELET
BASOS PCT: 1 %
Basophils Absolute: 0.1 10*3/uL (ref 0.0–0.1)
Eosinophils Absolute: 0.1 10*3/uL (ref 0.0–0.7)
Eosinophils Relative: 2 %
HEMATOCRIT: 40.7 % (ref 36.0–46.0)
HEMOGLOBIN: 14.1 g/dL (ref 12.0–15.0)
LYMPHS ABS: 2.8 10*3/uL (ref 0.7–4.0)
Lymphocytes Relative: 36 %
MCH: 30.3 pg (ref 26.0–34.0)
MCHC: 34.6 g/dL (ref 30.0–36.0)
MCV: 87.5 fL (ref 78.0–100.0)
MONOS PCT: 9 %
Monocytes Absolute: 0.7 10*3/uL (ref 0.1–1.0)
NEUTROS ABS: 4 10*3/uL (ref 1.7–7.7)
NEUTROS PCT: 52 %
Platelets: 231 10*3/uL (ref 150–400)
RBC: 4.65 MIL/uL (ref 3.87–5.11)
RDW: 12.4 % (ref 11.5–15.5)
WBC: 7.7 10*3/uL (ref 4.0–10.5)

## 2017-12-09 LAB — LIPASE, BLOOD: LIPASE: 19 U/L (ref 11–51)

## 2017-12-09 MED ORDER — IOPAMIDOL (ISOVUE-300) INJECTION 61%
100.0000 mL | Freq: Once | INTRAVENOUS | Status: AC | PRN
  Administered 2017-12-09: 100 mL via INTRAVENOUS

## 2017-12-09 MED ORDER — KETOROLAC TROMETHAMINE 30 MG/ML IJ SOLN
30.0000 mg | Freq: Once | INTRAMUSCULAR | Status: AC
Start: 2017-12-09 — End: 2017-12-09
  Administered 2017-12-09: 30 mg via INTRAVENOUS
  Filled 2017-12-09: qty 1

## 2017-12-09 MED ORDER — PEG 3350-KCL-NA BICARB-NACL 420 G PO SOLR: ORAL | 0 refills | Status: DC

## 2017-12-09 NOTE — ED Triage Notes (Signed)
Pt c/o constipation and nausea x 3 weeks. Pt reports she was seen at GI office yesterday and was given linzess abnd told to take mag citrate and enema. No results from these. Pt told to come to ED today by Dr. Janeece Riggers office. Pt reports she is able to eat and drink and is voiding appropriately.

## 2017-12-09 NOTE — Telephone Encounter (Signed)
Patient called with Progress Report. She has taken Linzess 290 , drank Mag Citrate and used 2 enemas. You has had no results.  Karna Christmas was called and made aware and she states that the patient has been constipated for 2 weeks , that she may need to go to the ED to be evaluated.  Patient was called and made aware.

## 2017-12-09 NOTE — Discharge Instructions (Addendum)
Follow-up with your doctor if the GoLYTELY does not work

## 2017-12-09 NOTE — ED Provider Notes (Signed)
[facility] EMERGENCY DEPARTMENT Provider Note   CSN: [id number] Arrival date & time: 12/09/17  1231     History   Chief Complaint Chief Complaint  Patient presents with  . Constipation    HPI [name] is a 47 y.o. female.  Patient complains of constipation.  She has not had a bowel movement over a week.  Patient had mild discomfort in her abdomen  The history is provided by the patient. No language interpreter was used.  Constipation   This is a new problem. The current episode started more than 1 week ago. The stool is described as firm. Pertinent negatives include no abdominal pain. There is no fiber in the patient's diet. She does not exercise regularly. There has not been adequate water intake. She has tried mineral oil for the symptoms. The treatment provided no relief.    Past Medical History:  Diagnosis Date  . Abdominal wall mass of right flank 04/11/13  . Abrasion of leg with infection 02/18/14  . Anxiety   . Benign paroxysmal vertigo 09/04/13  . Bronchitis, acute 02/28/14  . Bunion of great toe 04/11/13  . Cervicalgia 06/13/14  . Depression   . Diabetes ([place]) 11/08/2017  . Diabetes mellitus without complication ([place])   . Dorsalgia 05/17/14  . Dysuria 03/27/14  . Gastro-esophageal reflux disease without esophagitis 01/28/14  . Gastroenteritis 01/28/14  . GERD (gastroesophageal reflux disease)   . Hiatal hernia   . Hyperlipidemia   . Hypertension   . IBS (irritable bowel syndrome)   . Migraine headache   . Neuropathy   . Pain in thoracic spine 06/13/14  . 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 PE (physical exam), annual   . Peripheral neuropathy 11/05/13  . Subungual hematoma of digit of hand   . Tendonitis 03/07/13  . TIA (transient ischemic attack)   . Tobacco use   . Vertigo     Patient Active Problem List   Diagnosis Date Noted  . Family hx of colon cancer 11/10/2017  . Diabetes ([place]) 11/08/2017  . Chest pain 03/25/2015  . Hypertension   .  Hyperlipidemia   . Anxiety   . Pain in the chest   . Essential hypertension   . 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 TIA (transient ischemic attack) 08/17/2014  . Tobacco abuse   . Gastro-esophageal reflux disease without esophagitis 01/28/2014    Past Surgical History:  Procedure Laterality Date  . ABDOMINAL HYSTERECTOMY    . APPENDECTOMY    . CARDIAC CATHETERIZATION   last in 2009   x 4, normal coronary arteries  . CARDIAC CATHETERIZATION N/A 03/26/2015   Procedure: Left Heart Cath and Coronary Angiography;  Surgeon: Wellington Hampshire, MD;  Location: [place] [facility];  Service: Cardiovascular;  Laterality: N/A;  . CESAREAN SECTION    . CHOLECYSTECTOMY    . CYST EXCISION     right breast  . TUBAL LIGATION       OB History   None      Home Medications    Prior to Admission medications   Medication Sig Start Date End Date Taking? Authorizing Provider  buPROPion (ZYBAN) 150 MG 12 hr tablet Take 150 mg by mouth daily.   Yes [provider]  cyclobenzaprine (FLEXERIL) 10 MG tablet Take 10 mg by mouth as needed.  01/12/17  Yes [provider]  docusate sodium (COLACE) 100 MG capsule Take 100 mg by mouth 2 (two)  times daily.   Yes [provider]  EPIPEN 2-PAK 0.3 MG/0.3ML SOAJ injection Inject 0.3 mg into the muscle once.  02/13/16  Yes [provider]  estradiol (ESTRACE) 1 MG tablet Take 1 mg by mouth daily.   Yes [provider]  Insulin Glargine (LANTUS SOLOSTAR) 100 UNIT/ML Solostar Pen Inject 24 Units into the skin daily before breakfast. Patient taking differently: Inject 25 Units into the skin daily before breakfast.  04/08/15  Yes Philemon Kingdom, MD  insulin lispro (HUMALOG KWIKPEN) 100 UNIT/ML KiwkPen Inject 10 Units into the skin 3 (three) times daily with meals. + SLIDING SCALE   Yes [provider]  lisinopril (PRINIVIL,ZESTRIL) 5 MG tablet Take 5 mg by mouth  daily. 06/10/15  Yes [provider]  LYRICA 150 MG capsule Take 150 mg by mouth 2 (two) times daily.  08/19/15  Yes [provider]  Melatonin 10 MG CAPS Take 10 mg by mouth at bedtime.    Yes [provider]  Multiple Vitamin (MULTIVITAMIN WITH MINERALS) TABS Take 1 tablet by mouth every morning.   Yes [provider]  Omega-3 Fatty Acids (FISH OIL) 1200 MG CPDR Take 1 capsule by mouth daily.    Yes [provider]  omeprazole (PRILOSEC) 40 MG capsule Take 40 mg by mouth as needed.   Yes [provider]  pravastatin (PRAVACHOL) 10 MG tablet Take 10 mg by mouth at bedtime. 05/29/15  Yes [provider]  vitamin B-12 (CYANOCOBALAMIN) 500 MCG tablet Take 500 mcg by mouth daily.   Yes [provider]  polyethylene glycol-electrolytes (NULYTELY/GOLYTELY) 420 g solution Drink one 8 ounce glass every hour until you have a bowel movement 12/09/17   Milton Ferguson, MD    Family History Family History  Problem Relation Age of Onset  . Stroke Mother 59  . Heart attack Mother 67  . Cancer Sister        colon  . Heart failure Maternal Grandmother 65  . Cancer Maternal Grandfather        prostate  . Heart failure Paternal Grandmother 28  . Heart failure Paternal Grandfather 77    Social History Social History   Tobacco Use  . Smoking status: Current Every Day Smoker    Packs/day: 0.50    Years: 11.00    Pack years: 5.50    Types: Cigarettes  . Smokeless tobacco: Never Used  . Tobacco comment: patient is aware that she needs to quit smoking  Substance Use Topics  . Alcohol use: No    Alcohol/week: 0.0 oz  . Drug use: No     Allergies   Zocor [simvastatin]; Clindamycin/lincomycin; Codeine; Penicillins; and Sulfa antibiotics   Review of Systems Review of Systems  Constitutional: Negative for appetite change and fatigue.  HENT: Negative for congestion, ear discharge and sinus pressure.   Eyes: Negative for  discharge.  Respiratory: Negative for cough.   Cardiovascular: Negative for chest pain.  Gastrointestinal: Positive for constipation. Negative for abdominal pain and diarrhea.  Genitourinary: Negative for frequency and hematuria.  Musculoskeletal: Negative for back pain.  Skin: Negative for rash.  Neurological: Negative for seizures and headaches.  Psychiatric/Behavioral: Negative for hallucinations.     Physical Exam Updated Vital Signs BP 119/69   Pulse 99   Temp 98.1 F (36.7 C)   Resp 20   Ht 5\' 7"  (1.702 m)   Wt 78.5 kg (173 lb)   SpO2 97%   BMI 27.10 kg/m   Physical Exam  Constitutional: She is oriented to person, place, and time. She appears well-developed.  HENT:  Head: Normocephalic.  Eyes: Conjunctivae and EOM are normal. No scleral icterus.  Neck: Neck supple. No thyromegaly present.  Cardiovascular: Normal rate and regular rhythm. Exam reveals no gallop and no friction rub.  No murmur heard. Pulmonary/Chest: No stridor. She has no wheezes. She has no rales. She exhibits no tenderness.  Abdominal: She exhibits no distension. There is tenderness. There is no rebound.  Musculoskeletal: Normal range of motion. She exhibits no edema.  Lymphadenopathy:    She has no cervical adenopathy.  Neurological: She is oriented to person, place, and time. She exhibits normal muscle tone. Coordination normal.  Skin: No rash noted. No erythema.  Psychiatric: She has a normal mood and affect. Her behavior is normal.     ED Treatments / Results  Labs (all labs ordered are listed, but only abnormal results are displayed) Labs Reviewed  COMPREHENSIVE METABOLIC PANEL - Abnormal; Notable for the following components:      Result Value   Glucose, Bld 189 (*)    All other components within normal limits  CBC WITH DIFFERENTIAL/PLATELET  LIPASE, BLOOD    EKG None  Radiology Ct Abdomen Pelvis W Contrast  Result Date: 12/09/2017 CLINICAL DATA:  Nausea and constipation for 3  weeks. EXAM: CT ABDOMEN AND PELVIS WITH CONTRAST TECHNIQUE: Multidetector CT imaging of the abdomen and pelvis was performed using the standard protocol following bolus administration of intravenous contrast. CONTRAST:  141mL ISOVUE-300 IOPAMIDOL (ISOVUE-300) INJECTION 61% COMPARISON:  10/10/2017 FINDINGS: Lower chest: No acute abnormality. Hepatobiliary: No focal liver abnormality is seen. Status post cholecystectomy. No biliary dilatation. Pancreas: Unremarkable. No pancreatic ductal dilatation or surrounding inflammatory changes. Spleen: Normal in size without focal abnormality. Adrenals/Urinary Tract: Adrenal glands are unremarkable. Kidneys are normal, without renal calculi, focal lesion, or hydronephrosis. Bladder is unremarkable. Stomach/Bowel: Stomach is within normal limits. Post appendectomy. No evidence of bowel wall thickening, distention, or inflammatory changes. Moderate amount of formed stool throughout the colon. Vascular/Lymphatic: No significant vascular findings are present. No enlarged abdominal or pelvic lymph nodes. Reproductive: Status post hysterectomy. No adnexal masses. Other: No abdominal wall hernia or abnormality. No abdominopelvic ascites. Musculoskeletal: No acute or significant osseous findings. IMPRESSION: No acute abnormality within the abdomen or pelvis. Moderate amount of formed stool throughout the colon, consistent with the given history of constipation. Electronically Signed   By: Fidela Salisbury M.D.   On: 12/09/2017 15:31    Procedures Procedures (including critical care time)  Medications Ordered in ED Medications  ketorolac (TORADOL) 30 MG/ML injection 30 mg (30 mg Intravenous Given 12/09/17 1418)  iopamidol (ISOVUE-300) 61 % injection 100 mL (100 mLs Intravenous Contrast Given 12/09/17 1513)     Initial Impression / Assessment and Plan / ED Course  I have reviewed the triage vital signs and the nursing notes.  Pertinent labs & imaging results that were  available during my care of the patient were reviewed by me and considered in my medical decision making (see chart for details).     Labs unremarkable including CBC chemistries and lipase.  CT abdomen chest shows constipation.  Patient will be given GoLYTELY to relieve her constipation and follow-up with her doctor if needed  Final Clinical Impressions(s) / ED Diagnoses   Final diagnoses:  Constipation, unspecified constipation type    ED Discharge Orders        Ordered    polyethylene glycol-electrolytes (NULYTELY/GOLYTELY) 420 g solution  12/09/17 1543       Milton Ferguson, MD 12/09/17 1545

## 2017-12-10 ENCOUNTER — Emergency Department (HOSPITAL_COMMUNITY)
Admission: EM | Admit: 2017-12-10 | Discharge: 2017-12-10 | Disposition: A | Discharge status: Home / Self Care | Payer: Medicare HMO | Attending: Emergency Medicine | Admitting: Emergency Medicine

## 2017-12-10 ENCOUNTER — Other Ambulatory Visit: Payer: Self-pay

## 2017-12-10 ENCOUNTER — Encounter (HOSPITAL_COMMUNITY): Payer: Self-pay

## 2017-12-10 DIAGNOSIS — R110 Nausea: Secondary | ICD-10-CM | POA: Diagnosis not present

## 2017-12-10 DIAGNOSIS — E1040 Type 1 diabetes mellitus with diabetic neuropathy, unspecified: Secondary | ICD-10-CM | POA: Insufficient documentation

## 2017-12-10 DIAGNOSIS — I10 Essential (primary) hypertension: Secondary | ICD-10-CM | POA: Insufficient documentation

## 2017-12-10 DIAGNOSIS — F17210 Nicotine dependence, cigarettes, uncomplicated: Secondary | ICD-10-CM | POA: Insufficient documentation

## 2017-12-10 DIAGNOSIS — Z79899 Other long term (current) drug therapy: Secondary | ICD-10-CM | POA: Diagnosis not present

## 2017-12-10 DIAGNOSIS — K5901 Slow transit constipation: Secondary | ICD-10-CM

## 2017-12-10 DIAGNOSIS — Z794 Long term (current) use of insulin: Secondary | ICD-10-CM | POA: Insufficient documentation

## 2017-12-10 DIAGNOSIS — R1013 Epigastric pain: Secondary | ICD-10-CM | POA: Diagnosis not present

## 2017-12-10 MED ORDER — ONDANSETRON 8 MG PO TBDP
8.0000 mg | ORAL_TABLET | Freq: Once | ORAL | Status: AC
  Administered 2017-12-10: 8 mg via ORAL
  Filled 2017-12-10: qty 1

## 2017-12-10 MED ORDER — POLYETHYLENE GLYCOL 3350 17 G PO PACK: 17.0000 g | PACK | Freq: Every day | ORAL | 0 refills | Status: DC

## 2017-12-10 NOTE — Medical Student Note (Signed)
[place] DEPT Provider Student Note For educational purposes for Medical, PA and NP students only and not part of the legal medical record.   CSN: [id number] Arrival date & time: 12/10/17  0320  This is a 47 year old patient with a complaint of constipation/ no bowel movement in 3 weeks.   History   Chief Complaint Chief Complaint  Patient presents with  . Constipation    HPI [name] is a 47 y.o. female complaining of constipation for 3 weeks and new onset of nausea.  She has increased tenderness in the mid epigastric area.    The history is provided by the patient and medical records. No language interpreter was used.  Constipation   This is a chronic problem. The current episode started more than 1 week ago. Associated symptoms include abdominal pain. She does not exercise regularly. There has not been adequate water intake. Treatments tried: GoLytely at home from ER previous day. The treatment provided no relief.   She has new abdominal cramping and nausea. There is no vomiting.   Past Medical History:  Diagnosis Date  . Abdominal wall mass of right flank 04/11/13  . Abrasion of leg with infection 02/18/14  . Anxiety   . Benign paroxysmal vertigo 09/04/13  . Bronchitis, acute 02/28/14  . Bunion of great toe 04/11/13  . Cervicalgia 06/13/14  . Depression   . Diabetes ([place]) 11/08/2017  . Diabetes mellitus without complication ([place])   . Dorsalgia 05/17/14  . Dysuria 03/27/14  . Gastro-esophageal reflux disease without esophagitis 01/28/14  . Gastroenteritis 01/28/14  . GERD (gastroesophageal reflux disease)   . Hiatal hernia   . Hyperlipidemia   . Hypertension   . IBS (irritable bowel syndrome)   . Migraine headache   . Neuropathy   . Pain in thoracic spine 06/13/14  . 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 PE (physical exam), annual   . Peripheral neuropathy 11/05/13  . Subungual hematoma of digit of hand   . Tendonitis 03/07/13  . TIA (transient  ischemic attack)   . Tobacco use   . Vertigo     Patient Active Problem List   Diagnosis Date Noted  . Family hx of colon cancer 11/10/2017  . Diabetes ([place]) 11/08/2017  . Chest pain 03/25/2015  . Hypertension   . Hyperlipidemia   . Anxiety   . Pain in the chest   . Essential hypertension   . 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 TIA (transient ischemic attack) 08/17/2014  . Tobacco abuse   . Gastro-esophageal reflux disease without esophagitis 01/28/2014    Past Surgical History:  Procedure Laterality Date  . ABDOMINAL HYSTERECTOMY    . APPENDECTOMY    . CARDIAC CATHETERIZATION   last in 2009   x 4, normal coronary arteries  . CARDIAC CATHETERIZATION N/A 03/26/2015   Procedure: Left Heart Cath and Coronary Angiography;  Surgeon: Wellington Hampshire, MD;  Location: [place] [facility];  Service: Cardiovascular;  Laterality: N/A;  . CESAREAN SECTION    . CHOLECYSTECTOMY    . CYST EXCISION     right breast  . TUBAL LIGATION      OB History   None      Home Medications    Prior to Admission medications   Medication Sig Start Date End Date Taking? Authorizing Provider  buPROPion (ZYBAN) 150 MG 12 hr tablet Take 150 mg by mouth daily.    [provider]  cyclobenzaprine (FLEXERIL) 10 MG tablet Take 10 mg by mouth as needed.  01/12/17   [provider]  docusate sodium (COLACE) 100 MG capsule Take 100 mg by mouth 2 (two) times daily.    [provider]  EPIPEN 2-PAK 0.3 MG/0.3ML SOAJ injection Inject 0.3 mg into the muscle once.  02/13/16   [provider]  estradiol (ESTRACE) 1 MG tablet Take 1 mg by mouth daily.    [provider]  Insulin Glargine (LANTUS SOLOSTAR) 100 UNIT/ML Solostar Pen Inject 24 Units into the skin daily before breakfast. Patient taking differently: Inject 25 Units into the skin daily before breakfast.  04/08/15   Philemon Kingdom, MD  insulin lispro (HUMALOG  KWIKPEN) 100 UNIT/ML KiwkPen Inject 10 Units into the skin 3 (three) times daily with meals. + SLIDING SCALE    [provider]  lisinopril (PRINIVIL,ZESTRIL) 5 MG tablet Take 5 mg by mouth daily. 06/10/15   [provider]  LYRICA 150 MG capsule Take 150 mg by mouth 2 (two) times daily.  08/19/15   [provider]  Melatonin 10 MG CAPS Take 10 mg by mouth at bedtime.     [provider]  Multiple Vitamin (MULTIVITAMIN WITH MINERALS) TABS Take 1 tablet by mouth every morning.    [provider]  Omega-3 Fatty Acids (FISH OIL) 1200 MG CPDR Take 1 capsule by mouth daily.     [provider]  omeprazole (PRILOSEC) 40 MG capsule Take 40 mg by mouth as needed.    [provider]  polyethylene glycol-electrolytes (NULYTELY/GOLYTELY) 420 g solution Drink one 8 ounce glass every hour until you have a bowel movement 12/09/17   Milton Ferguson, MD  pravastatin (PRAVACHOL) 10 MG tablet Take 10 mg by mouth at bedtime. 05/29/15   [provider]  vitamin B-12 (CYANOCOBALAMIN) 500 MCG tablet Take 500 mcg by mouth daily.    [provider]    Family History Family History  Problem Relation Age of Onset  . Stroke Mother 47  . Heart attack Mother 25  . Cancer Sister        colon  . Heart failure Maternal Grandmother 65  . Cancer Maternal Grandfather        prostate  . Heart failure Paternal Grandmother 74  . Heart failure Paternal Grandfather 36    Social History Social History   Tobacco Use  . Smoking status: Current Every Day Smoker    Packs/day: 0.50    Years: 11.00    Pack years: 5.50    Types: Cigarettes  . Smokeless tobacco: Never Used  . Tobacco comment: patient is aware that she needs to quit smoking  Substance Use Topics  . Alcohol use: No    Alcohol/week: 0.0 oz  . Drug use: No     Allergies   Zocor [simvastatin]; Clindamycin/lincomycin; Codeine; Penicillins; and Sulfa antibiotics   Review of  Systems Review of Systems  Constitutional: Negative.   HENT: Negative.   Eyes: Negative.   Respiratory: Negative.   Cardiovascular: Negative.   Gastrointestinal: Positive for abdominal pain, constipation and nausea. Negative for abdominal distention and vomiting.  Endocrine: Negative.   Genitourinary: Negative.   Musculoskeletal: Negative.   Skin: Negative.   Neurological: Negative.   Psychiatric/Behavioral: Negative.      Physical Exam Updated Vital Signs BP (!) 122/91 (BP Location: Left Arm)   Pulse 81   Temp 97.8 F (36.6 C) (Oral)   Resp 18   Ht 5\' 7"  (1.702  m)   Wt 78.5 kg   SpO2 97%   BMI 27.10 kg/m   Physical Exam  Constitutional: She is oriented to person, place, and time. She appears well-developed and well-nourished.  HENT:  Head: Normocephalic.  Eyes: Pupils are equal, round, and reactive to light.  Neck: Normal range of motion.  Cardiovascular: Normal rate, normal heart sounds and intact distal pulses.  Pulmonary/Chest: Effort normal and breath sounds normal.  Abdominal: Normal appearance. She exhibits no distension. There is tenderness.  Musculoskeletal: Normal range of motion.  Neurological: She is oriented to person, place, and time.  Skin: Skin is warm and dry. Capillary refill takes less than 2 seconds.  Psychiatric: She has a normal mood and affect.     ED Treatments / Results  Labs (all labs ordered are listed, but only abnormal results are displayed) Labs Reviewed - No data to display  EKG None Radiology Ct Abdomen Pelvis W Contrast  Result Date: 12/09/2017 CLINICAL DATA:  Nausea and constipation for 3 weeks. EXAM: CT ABDOMEN AND PELVIS WITH CONTRAST TECHNIQUE: Multidetector CT imaging of the abdomen and pelvis was performed using the standard protocol following bolus administration of intravenous contrast. CONTRAST:  127mL ISOVUE-300 IOPAMIDOL (ISOVUE-300) INJECTION 61% COMPARISON:  10/10/2017 FINDINGS: Lower chest: No acute abnormality.  Hepatobiliary: No focal liver abnormality is seen. Status post cholecystectomy. No biliary dilatation. Pancreas: Unremarkable. No pancreatic ductal dilatation or surrounding inflammatory changes. Spleen: Normal in size without focal abnormality. Adrenals/Urinary Tract: Adrenal glands are unremarkable. Kidneys are normal, without renal calculi, focal lesion, or hydronephrosis. Bladder is unremarkable. Stomach/Bowel: Stomach is within normal limits. Post appendectomy. No evidence of bowel wall thickening, distention, or inflammatory changes. Moderate amount of formed stool throughout the colon. Vascular/Lymphatic: No significant vascular findings are present. No enlarged abdominal or pelvic lymph nodes. Reproductive: Status post hysterectomy. No adnexal masses. Other: No abdominal wall hernia or abnormality. No abdominopelvic ascites. Musculoskeletal: No acute or significant osseous findings. IMPRESSION: No acute abnormality within the abdomen or pelvis. Moderate amount of formed stool throughout the colon, consistent with the given history of constipation. Electronically Signed   By: Fidela Salisbury M.D.   On: 12/09/2017 15:31    Procedures Procedures (including critical care time) A CT was performed on 12/09/17 and showed no obstruction.  Medications Ordered in ED Medications  ondansetron (ZOFRAN-ODT) disintegrating tablet 8 mg (8 mg Oral Given 12/10/17 0424)     Initial Impression / Assessment and Plan / ED Course  I have reviewed the triage vital signs and the nursing notes.  Pertinent labs & imaging results that were available during my care of the patient were reviewed by me and considered in my medical decision making (see chart for details).    Recurring constipation with ongoing treatment.   Final Clinical Impressions(s) / ED Diagnoses   Constipation: Patient complains of constipation.  Stool pattern has been absent for over a week. Onset was 3 weeks ago Symptoms have been continuing  for 3 weeks  Current OTC/RX therapy has been ineffective.  New Prescriptions New Prescriptions   POLYETHYLENE GLYCOL (MIRALAX / GLYCOLAX) PACKET    Take 17 g by mouth daily.

## 2017-12-10 NOTE — ED Provider Notes (Signed)
[facility] EMERGENCY DEPARTMENT Provider Note   CSN: [id number] Arrival date & time: 12/10/17  0320     History   Chief Complaint Chief Complaint  Patient presents with  . Constipation    HPI [name] is a 47 y.o. female.  The history is provided by the patient.  Constipation   This is a chronic problem. The current episode started more than 1 week ago. Associated symptoms include abdominal pain. Pertinent negatives include no flatus and no dysuria. Treatments tried: golytely and enemas. The treatment provided no relief.   Reports onset of constipation 3 weeks ago.  She reports she had no bowel movements at that time.  She reports she is not passing gas.  No fevers or vomiting.  She has been seen by GI for this and is not recommended to take mag citrate, as well as enemas.  She was seen in the emergency department on May 10 yesterday, had a negative CT scan and was sent home.  She was instructed to take GoLYTELY which she finished without any improvement She reports epigastric pain is still present even before the GoLYTELY. She is concerned because she has not had another bowel movement. Past Medical History:  Diagnosis Date  . Abdominal wall mass of right flank 04/11/13  . Abrasion of leg with infection 02/18/14  . Anxiety   . Benign paroxysmal vertigo 09/04/13  . Bronchitis, acute 02/28/14  . Bunion of great toe 04/11/13  . Cervicalgia 06/13/14  . Depression   . Diabetes ([place]) 11/08/2017  . Diabetes mellitus without complication ([place])   . Dorsalgia 05/17/14  . Dysuria 03/27/14  . Gastro-esophageal reflux disease without esophagitis 01/28/14  . Gastroenteritis 01/28/14  . GERD (gastroesophageal reflux disease)   . Hiatal hernia   . Hyperlipidemia   . Hypertension   . IBS (irritable bowel syndrome)   . Migraine headache   . Neuropathy   . Pain in thoracic spine 06/13/14  . 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 PE (physical exam), annual   . Peripheral  neuropathy 11/05/13  . Subungual hematoma of digit of hand   . Tendonitis 03/07/13  . TIA (transient ischemic attack)   . Tobacco use   . Vertigo     Patient Active Problem List   Diagnosis Date Noted  . Family hx of colon cancer 11/10/2017  . Diabetes ([place]) 11/08/2017  . Chest pain 03/25/2015  . Hypertension   . Hyperlipidemia   . Anxiety   . Pain in the chest   . Essential hypertension   . 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 TIA (transient ischemic attack) 08/17/2014  . Tobacco abuse   . Gastro-esophageal reflux disease without esophagitis 01/28/2014    Past Surgical History:  Procedure Laterality Date  . ABDOMINAL HYSTERECTOMY    . APPENDECTOMY    . CARDIAC CATHETERIZATION   last in 2009   x 4, normal coronary arteries  . CARDIAC CATHETERIZATION N/A 03/26/2015   Procedure: Left Heart Cath and Coronary Angiography;  Surgeon: Wellington Hampshire, MD;  Location: [facility];  Service: Cardiovascular;  Laterality: N/A;  . CESAREAN SECTION    . CHOLECYSTECTOMY    . CYST EXCISION     right breast  . TUBAL LIGATION       OB History   None      Home Medications    Prior to Admission medications   Medication Sig Start Date End Date Taking?  Authorizing Provider  buPROPion (ZYBAN) 150 MG 12 hr tablet Take 150 mg by mouth daily.    [provider]  cyclobenzaprine (FLEXERIL) 10 MG tablet Take 10 mg by mouth as needed.  01/12/17   [provider]  docusate sodium (COLACE) 100 MG capsule Take 100 mg by mouth 2 (two) times daily.    [provider]  EPIPEN 2-PAK 0.3 MG/0.3ML SOAJ injection Inject 0.3 mg into the muscle once.  02/13/16   [provider]  estradiol (ESTRACE) 1 MG tablet Take 1 mg by mouth daily.    [provider]  Insulin Glargine (LANTUS SOLOSTAR) 100 UNIT/ML Solostar Pen Inject 24 Units into the skin daily before breakfast. Patient taking differently: Inject 25  Units into the skin daily before breakfast.  04/08/15   Philemon Kingdom, MD  insulin lispro (HUMALOG KWIKPEN) 100 UNIT/ML KiwkPen Inject 10 Units into the skin 3 (three) times daily with meals. + SLIDING SCALE    [provider]  lisinopril (PRINIVIL,ZESTRIL) 5 MG tablet Take 5 mg by mouth daily. 06/10/15   [provider]  LYRICA 150 MG capsule Take 150 mg by mouth 2 (two) times daily.  08/19/15   [provider]  Melatonin 10 MG CAPS Take 10 mg by mouth at bedtime.     [provider]  Multiple Vitamin (MULTIVITAMIN WITH MINERALS) TABS Take 1 tablet by mouth every morning.    [provider]  Omega-3 Fatty Acids (FISH OIL) 1200 MG CPDR Take 1 capsule by mouth daily.     [provider]  omeprazole (PRILOSEC) 40 MG capsule Take 40 mg by mouth as needed.    [provider]  polyethylene glycol (MIRALAX / GLYCOLAX) packet Take 17 g by mouth daily. 12/10/17   Ripley Fraise, MD  polyethylene glycol-electrolytes (NULYTELY/GOLYTELY) 420 g solution Drink one 8 ounce glass every hour until you have a bowel movement 12/09/17   Milton Ferguson, MD  pravastatin (PRAVACHOL) 10 MG tablet Take 10 mg by mouth at bedtime. 05/29/15   [provider]  vitamin B-12 (CYANOCOBALAMIN) 500 MCG tablet Take 500 mcg by mouth daily.    [provider]    Family History Family History  Problem Relation Age of Onset  . Stroke Mother 48  . Heart attack Mother 57  . Cancer Sister        colon  . Heart failure Maternal Grandmother 65  . Cancer Maternal Grandfather        prostate  . Heart failure Paternal Grandmother 45  . Heart failure Paternal Grandfather 21    Social History Social History   Tobacco Use  . Smoking status: Current Every Day Smoker    Packs/day: 0.50    Years: 11.00    Pack years: 5.50    Types: Cigarettes  . Smokeless tobacco: Never Used  . Tobacco comment: patient is aware that she needs to quit smoking    Substance Use Topics  . Alcohol use: No    Alcohol/week: 0.0 oz  . Drug use: No     Allergies   Zocor [simvastatin]; Clindamycin/lincomycin; Codeine; Penicillins; and Sulfa antibiotics   Review of Systems Review of Systems  Constitutional: Negative for fever.  Cardiovascular: Negative for chest pain.  Gastrointestinal: Positive for abdominal pain, constipation and nausea. Negative for diarrhea, flatus and vomiting.  Genitourinary: Negative for dysuria and vaginal bleeding.  All other systems reviewed and are negative.    Physical Exam Updated Vital Signs BP (!) 122/91 (BP  Location: Left Arm)   Pulse 81   Temp 97.8 F (36.6 C) (Oral)   Resp 18   Ht 1.702 m (5\' 7" )   Wt 78.5 kg (173 lb)   SpO2 97%   BMI 27.10 kg/m   Physical Exam  CONSTITUTIONAL: Well developed/well nourished HEAD: Normocephalic/atraumatic EYES: EOMI/PERRL ENMT: Mucous membranes moist NECK: supple no meningeal signs SPINE/BACK:entire spine nontender CV: S1/S2 noted, no murmurs/rubs/gallops noted LUNGS: Lungs are clear to auscultation bilaterally, no apparent distress ABDOMEN: soft, mild epigastric tenderness, no rebound or guarding, bowel sounds noted throughout abdomen GU:no cva tenderness NEURO: Pt is awake/alert/appropriate, moves all extremitiesx4.  No facial droop.   EXTREMITIES: pulses normal/equal, full ROM SKIN: warm, color normal PSYCH: no abnormalities of mood noted, alert and oriented to situation  ED Treatments / Results  Labs (all labs ordered are listed, but only abnormal results are displayed) Labs Reviewed - No data to display  EKG None  Radiology Ct Abdomen Pelvis W Contrast  Result Date: 12/09/2017 CLINICAL DATA:  Nausea and constipation for 3 weeks. EXAM: CT ABDOMEN AND PELVIS WITH CONTRAST TECHNIQUE: Multidetector CT imaging of the abdomen and pelvis was performed using the standard protocol following bolus administration of intravenous contrast. CONTRAST:  179mL  ISOVUE-300 IOPAMIDOL (ISOVUE-300) INJECTION 61% COMPARISON:  10/10/2017 FINDINGS: Lower chest: No acute abnormality. Hepatobiliary: No focal liver abnormality is seen. Status post cholecystectomy. No biliary dilatation. Pancreas: Unremarkable. No pancreatic ductal dilatation or surrounding inflammatory changes. Spleen: Normal in size without focal abnormality. Adrenals/Urinary Tract: Adrenal glands are unremarkable. Kidneys are normal, without renal calculi, focal lesion, or hydronephrosis. Bladder is unremarkable. Stomach/Bowel: Stomach is within normal limits. Post appendectomy. No evidence of bowel wall thickening, distention, or inflammatory changes. Moderate amount of formed stool throughout the colon. Vascular/Lymphatic: No significant vascular findings are present. No enlarged abdominal or pelvic lymph nodes. Reproductive: Status post hysterectomy. No adnexal masses. Other: No abdominal wall hernia or abnormality. No abdominopelvic ascites. Musculoskeletal: No acute or significant osseous findings. IMPRESSION: No acute abnormality within the abdomen or pelvis. Moderate amount of formed stool throughout the colon, consistent with the given history of constipation. Electronically Signed   By: Fidela Salisbury M.D.   On: 12/09/2017 15:31    Procedures Procedures (including critical care time)  Medications Ordered in ED Medications  ondansetron (ZOFRAN-ODT) disintegrating tablet 8 mg (8 mg Oral Given 12/10/17 0424)     Initial Impression / Assessment and Plan / ED Course  I have reviewed the triage vital signs and the nursing notes.        Pt was just seen in the emergency department yesterday and had a CT scan that revealed only constipation, no acute bowel obstruction or other acute abdominal emergency She was concerned because she took the GoLYTELY as instructed and no improvement. She is awake alert, no distress.  Her abdomen is soft, with bowel sounds noted. She is not vomiting, she  is nontoxic We had an extensive discussion about her recent CT scan findings.  She is scheduled for colonoscopy next month which she is looking forward to because she has a strong family history of colon cancer I did inform her that the CT scan cannot rule out colon cancer, but there is no signs of acute obstructive process  She declines enema at this time.  I will place her on MiraLAX daily, and she should call her gastroenterologist in 48 hours. We discussed strict return precautions  Final Clinical Impressions(s) / ED Diagnoses   Final diagnoses:  Slow  transit constipation    ED Discharge Orders        Ordered    polyethylene glycol (MIRALAX / GLYCOLAX) packet  Daily     12/10/17 0435       Ripley Fraise, MD 12/10/17 [phone]

## 2017-12-10 NOTE — ED Triage Notes (Signed)
No bowel movement for 3 weeks. Was seen here yesterday for same. Currently having abdominal cramps 7/10

## 2017-12-12 ENCOUNTER — Emergency Department (HOSPITAL_COMMUNITY)
Admission: EM | Admit: 2017-12-12 | Discharge: 2017-12-12 | Disposition: A | Discharge status: Home / Self Care | Payer: Medicare HMO | Attending: Emergency Medicine | Admitting: Emergency Medicine

## 2017-12-12 ENCOUNTER — Encounter (HOSPITAL_COMMUNITY): Payer: Self-pay | Admitting: Emergency Medicine

## 2017-12-12 ENCOUNTER — Emergency Department (HOSPITAL_COMMUNITY): Payer: Medicare HMO

## 2017-12-12 ENCOUNTER — Telehealth (INDEPENDENT_AMBULATORY_CARE_PROVIDER_SITE_OTHER): Payer: Self-pay | Admitting: Internal Medicine

## 2017-12-12 DIAGNOSIS — E785 Hyperlipidemia, unspecified: Secondary | ICD-10-CM | POA: Insufficient documentation

## 2017-12-12 DIAGNOSIS — I10 Essential (primary) hypertension: Secondary | ICD-10-CM | POA: Diagnosis not present

## 2017-12-12 DIAGNOSIS — F17210 Nicotine dependence, cigarettes, uncomplicated: Secondary | ICD-10-CM | POA: Insufficient documentation

## 2017-12-12 DIAGNOSIS — Z79899 Other long term (current) drug therapy: Secondary | ICD-10-CM | POA: Insufficient documentation

## 2017-12-12 DIAGNOSIS — K5900 Constipation, unspecified: Secondary | ICD-10-CM | POA: Insufficient documentation

## 2017-12-12 DIAGNOSIS — R109 Unspecified abdominal pain: Secondary | ICD-10-CM | POA: Diagnosis not present

## 2017-12-12 DIAGNOSIS — Z794 Long term (current) use of insulin: Secondary | ICD-10-CM | POA: Insufficient documentation

## 2017-12-12 DIAGNOSIS — E1040 Type 1 diabetes mellitus with diabetic neuropathy, unspecified: Secondary | ICD-10-CM | POA: Diagnosis not present

## 2017-12-12 DIAGNOSIS — Z8673 Personal history of transient ischemic attack (TIA), and cerebral infarction without residual deficits: Secondary | ICD-10-CM | POA: Diagnosis not present

## 2017-12-12 LAB — COMPREHENSIVE METABOLIC PANEL
ALBUMIN: 3.8 g/dL (ref 3.5–5.0)
ALK PHOS: 138 U/L — AB (ref 38–126)
ALT: 65 U/L — ABNORMAL HIGH (ref 14–54)
AST: 61 U/L — AB (ref 15–41)
Anion gap: 8 (ref 5–15)
BUN: 9 mg/dL (ref 6–20)
CO2: 27 mmol/L (ref 22–32)
Calcium: 9.5 mg/dL (ref 8.9–10.3)
Chloride: 104 mmol/L (ref 101–111)
Creatinine, Ser: 0.69 mg/dL (ref 0.44–1.00)
GFR calc Af Amer: >60 mL/min (ref >60)
GFR calc non Af Amer: >60 mL/min (ref >60)
GLUCOSE: 191 mg/dL — AB (ref 65–99)
POTASSIUM: 4.2 mmol/L (ref 3.5–5.1)
Sodium: 139 mmol/L (ref 135–145)
Total Bilirubin: 0.5 mg/dL (ref 0.3–1.2)
Total Protein: 6.7 g/dL (ref 6.5–8.1)

## 2017-12-12 LAB — CBC WITH DIFFERENTIAL/PLATELET
BASOS ABS: 0.1 10*3/uL (ref 0.0–0.1)
BASOS PCT: 1 %
Eosinophils Absolute: 0.1 10*3/uL (ref 0.0–0.7)
Eosinophils Relative: 2 %
HEMATOCRIT: 40.6 % (ref 36.0–46.0)
HEMOGLOBIN: 13.8 g/dL (ref 12.0–15.0)
Lymphocytes Relative: 34 %
Lymphs Abs: 2.2 10*3/uL (ref 0.7–4.0)
MCH: 29.9 pg (ref 26.0–34.0)
MCHC: 34 g/dL (ref 30.0–36.0)
MCV: 88.1 fL (ref 78.0–100.0)
MONOS PCT: 8 %
Monocytes Absolute: 0.5 10*3/uL (ref 0.1–1.0)
NEUTROS ABS: 3.7 10*3/uL (ref 1.7–7.7)
NEUTROS PCT: 55 %
Platelets: 226 10*3/uL (ref 150–400)
RBC: 4.61 MIL/uL (ref 3.87–5.11)
RDW: 12.8 % (ref 11.5–15.5)
WBC: 6.6 10*3/uL (ref 4.0–10.5)

## 2017-12-12 MED ORDER — IOPAMIDOL (ISOVUE-300) INJECTION 61%
INTRAVENOUS | Status: AC
  Administered 2017-12-12: 1500 mL via RECTAL
  Filled 2017-12-12: qty 150

## 2017-12-12 MED ORDER — IOPAMIDOL (ISOVUE-300) INJECTION 61%
INTRAVENOUS | Status: AC
  Filled 2017-12-12: qty 750

## 2017-12-12 MED ORDER — ONDANSETRON 4 MG PO TBDP
4.0000 mg | ORAL_TABLET | Freq: Once | ORAL | Status: AC
  Administered 2017-12-12: 4 mg via ORAL
  Filled 2017-12-12: qty 1

## 2017-12-12 MED ORDER — PEG 3350-KCL-NABCB-NACL-NASULF 236 G PO SOLR: 4000.0000 mL | Freq: Once | ORAL | 0 refills | Status: AC

## 2017-12-12 NOTE — ED Triage Notes (Signed)
Patient complaining of no bowel movement x 4 weeks and abdominal cramping. States she was seen here recently for same.

## 2017-12-12 NOTE — ED Notes (Signed)
Pt is able to eat fine, is drinking plenty of water. Pt tried to eat greasy foods to stimulate bowels with no success

## 2017-12-12 NOTE — ED Provider Notes (Signed)
[facility] EMERGENCY DEPARTMENT Provider Note   CSN: [id number] Arrival date & time: 12/12/17  1135     History   Chief Complaint Chief Complaint  Patient presents with  . Constipation    HPI Zynasia Burklow is a 47 y.o. female.  Chief complaint is constipation.  HPI: Patient presents for fourth evaluation of constipation.  Has had constipation for the last 4 weeks.  No prior history of constipation on any significant level.  Seen at GI office of Dr. Laural Golden 5/9 py MP.  Given samples of Linzess.  Seen here on 5/11.  CT scan showed constipation but no other acute process.  Given GoLYTELY.  States she drank the entire bottle.  Seen here 5/11.  Still no bowel movement.  Continued on daily MiraLAX and Colace 3 times daily.  Still no bowel movement.  She did a enema last night.  Still no bowel movement.  She states she does not feel distended.  She was nauseated this morning.  No bleeding.  No significant rectal pain.  Intermittent abdominal cramps.  Past Medical History:  Diagnosis Date  . Abdominal wall mass of right flank 04/11/13  . Abrasion of leg with infection 02/18/14  . Anxiety   . Benign paroxysmal vertigo 09/04/13  . Bronchitis, acute 02/28/14  . Bunion of great toe 04/11/13  . Cervicalgia 06/13/14  . Depression   . Diabetes ([place]) 11/08/2017  . Diabetes mellitus without complication ([place])   . Dorsalgia 05/17/14  . Dysuria 03/27/14  . Gastro-esophageal reflux disease without esophagitis 01/28/14  . Gastroenteritis 01/28/14  . GERD (gastroesophageal reflux disease)   . Hiatal hernia   . Hyperlipidemia   . Hypertension   . IBS (irritable bowel syndrome)   . Migraine headache   . Neuropathy   . Pain in thoracic spine 06/13/14  . 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 PE (physical exam), annual   . Peripheral neuropathy 11/05/13  . Subungual hematoma of digit of hand   . Tendonitis 03/07/13  . TIA (transient ischemic attack)   . Tobacco use   . Vertigo     Patient  Active Problem List   Diagnosis Date Noted  . Family hx of colon cancer 11/10/2017  . Diabetes ([place]) 11/08/2017  . Chest pain 03/25/2015  . Hypertension   . Hyperlipidemia   . Anxiety   . Pain in the chest   . Essential hypertension   . 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 TIA (transient ischemic attack) 08/17/2014  . Tobacco abuse   . Gastro-esophageal reflux disease without esophagitis 01/28/2014    Past Surgical History:  Procedure Laterality Date  . ABDOMINAL HYSTERECTOMY    . APPENDECTOMY    . CARDIAC CATHETERIZATION   last in 2009   x 4, normal coronary arteries  . CARDIAC CATHETERIZATION N/A 03/26/2015   Procedure: Left Heart Cath and Coronary Angiography;  Surgeon: Wellington Hampshire, MD;  Location: [facility];  Service: Cardiovascular;  Laterality: N/A;  . CESAREAN SECTION    . CHOLECYSTECTOMY    . CYST EXCISION     right breast  . TUBAL LIGATION       OB History   None      Home Medications    Prior to Admission medications   Medication Sig Start Date End Date Taking? Authorizing Provider  buPROPion (ZYBAN) 150 MG 12 hr tablet Take 150 mg by mouth daily.   Yes [provider]  cyclobenzaprine (FLEXERIL) 10 MG tablet Take 10 mg by mouth as needed.  01/12/17  Yes [provider]  docusate sodium (COLACE) 100 MG capsule Take 100 mg by mouth 2 (two) times daily.   Yes [provider]  EPIPEN 2-PAK 0.3 MG/0.3ML SOAJ injection Inject 0.3 mg into the muscle once.  02/13/16  Yes [provider]  estradiol (ESTRACE) 1 MG tablet Take 1 mg by mouth daily.   Yes [provider]  Insulin Glargine (LANTUS SOLOSTAR) 100 UNIT/ML Solostar Pen Inject 24 Units into the skin daily before breakfast. Patient taking differently: Inject 25 Units into the skin daily before breakfast.  04/08/15  Yes Philemon Kingdom, MD  insulin lispro (HUMALOG KWIKPEN) 100 UNIT/ML KiwkPen Inject 10  Units into the skin 3 (three) times daily with meals. + SLIDING SCALE   Yes [provider]  lisinopril (PRINIVIL,ZESTRIL) 5 MG tablet Take 5 mg by mouth daily. 06/10/15  Yes [provider]  LYRICA 150 MG capsule Take 150 mg by mouth 2 (two) times daily.  08/19/15  Yes [provider]  Melatonin 10 MG CAPS Take 10 mg by mouth at bedtime.    Yes [provider]  Multiple Vitamin (MULTIVITAMIN WITH MINERALS) TABS Take 1 tablet by mouth every morning.   Yes [provider]  Omega-3 Fatty Acids (FISH OIL) 1200 MG CPDR Take 1 capsule by mouth daily.    Yes [provider]  omeprazole (PRILOSEC) 40 MG capsule Take 40 mg by mouth as needed.   Yes [provider]  polyethylene glycol (MIRALAX / GLYCOLAX) packet Take 17 g by mouth daily. 12/10/17  Yes Ripley Fraise, MD  polyethylene glycol-electrolytes (NULYTELY/GOLYTELY) 420 g solution Drink one 8 ounce glass every hour until you have a bowel movement 12/09/17  Yes Milton Ferguson, MD  pravastatin (PRAVACHOL) 10 MG tablet Take 10 mg by mouth at bedtime. 05/29/15  Yes [provider]  polyethylene glycol (GOLYTELY) 236 g solution Take 4,000 mLs by mouth once for 1 dose. 12/12/17 12/12/17  Tanna Furry, MD    Family History Family History  Problem Relation Age of Onset  . Stroke Mother 17  . Heart attack Mother 82  . Cancer Sister        colon  . Heart failure Maternal Grandmother 65  . Cancer Maternal Grandfather        prostate  . Heart failure Paternal Grandmother 36  . Heart failure Paternal Grandfather 19    Social History Social History   Tobacco Use  . Smoking status: Current Every Day Smoker    Packs/day: 0.50    Years: 11.00    Pack years: 5.50    Types: Cigarettes  . Smokeless tobacco: Never Used  . Tobacco comment: patient is aware that she needs to quit smoking  Substance Use Topics  . Alcohol use: No    Alcohol/week: 0.0 oz  . Drug use: No      Allergies   Zocor [simvastatin]; Clindamycin/lincomycin; Codeine; Penicillins; and Sulfa antibiotics   Review of Systems Review of Systems  Constitutional: Negative for appetite change, chills, diaphoresis, fatigue and fever.  HENT: Negative for mouth sores, sore throat and trouble swallowing.   Eyes: Negative for visual disturbance.  Respiratory: Negative for cough, chest tightness, shortness of breath and wheezing.   Cardiovascular: Negative for chest pain.  Gastrointestinal: Positive for abdominal pain and constipation. Negative for abdominal distention, diarrhea, nausea and vomiting.  Endocrine: Negative for polydipsia, polyphagia and polyuria.  Genitourinary: Negative  for dysuria, frequency and hematuria.  Musculoskeletal: Negative for gait problem.  Skin: Negative for color change, pallor and rash.  Neurological: Negative for dizziness, syncope, light-headedness and headaches.  Hematological: Does not bruise/bleed easily.  Psychiatric/Behavioral: Negative for behavioral problems and confusion.     Physical Exam Updated Vital Signs BP 117/86 (BP Location: Left Arm)   Pulse 83   Temp 97.9 F (36.6 C) (Oral)   Resp 15   Ht 5\' 7"  (1.702 m)   Wt 78.5 kg (173 lb)   SpO2 97%   BMI 27.10 kg/m   Physical Exam  Constitutional: She is oriented to person, place, and time. She appears well-developed and well-nourished. No distress.  HENT:  Head: Normocephalic.  Eyes: Pupils are equal, round, and reactive to light. Conjunctivae are normal. No scleral icterus.  Neck: Normal range of motion. Neck supple. No thyromegaly present.  Cardiovascular: Normal rate and regular rhythm. Exam reveals no gallop and no friction rub.  No murmur heard. Pulmonary/Chest: Effort normal and breath sounds normal. No respiratory distress. She has no wheezes. She has no rales.  Abdominal: Soft. Bowel sounds are normal. She exhibits no distension. There is no tenderness. There is no rebound.   Hypoactive bowel sounds but present.  No distention.  No focal tenderness.  Rectal exam palpable small amount of firm stool in vault.  Mobile.  No impaction  Musculoskeletal: Normal range of motion.  Neurological: She is alert and oriented to person, place, and time.  Skin: Skin is warm and dry. No rash noted.  Psychiatric: She has a normal mood and affect. Her behavior is normal.     ED Treatments / Results  Labs (all labs ordered are listed, but only abnormal results are displayed) Labs Reviewed  COMPREHENSIVE METABOLIC PANEL - Abnormal; Notable for the following components:      Result Value   Glucose, Bld 191 (*)    AST 61 (*)    ALT 65 (*)    Alkaline Phosphatase 138 (*)    All other components within normal limits  CBC WITH DIFFERENTIAL/PLATELET    EKG None  Radiology Dg Abd 1 View  Result Date: 12/12/2017 CLINICAL DATA:  Constipation for several weeks with nausea and vomiting EXAM: ABDOMEN - 1 VIEW COMPARISON:  12/09/2017 FINDINGS: Material is again noted throughout the colon consistent with the given clinical history of constipation. No obstructive changes are noted. No abnormal mass or abnormal calcifications are seen. IMPRESSION: Findings of constipation stable from the previous exam. Electronically Signed   By: Inez Catalina M.D.   On: 12/12/2017 12:46   Dg Colon W/water Sol Cm  Result Date: 12/12/2017 CLINICAL DATA:  Constipation, no relief with laxatives. EXAM: COLON WITH WATER SOLUTION CONTRAST COMPARISON:  None. FINDINGS: No evidence of colonic stricture. There is a large amount of retained stool, particularly in the right colon and transverse colon. Reflux of contrast into the distal small bowel with fecalization of the distal ileum. IMPRESSION: Large stool burden in the colon, particularly right colon and transverse colon. No stricture. Electronically Signed   By: Rolm Baptise M.D.   On: 12/12/2017 15:22    Procedures Procedures (including critical care  time)  Medications Ordered in ED Medications  iopamidol (ISOVUE-300) 61 % injection (has no administration in time range)  ondansetron (ZOFRAN-ODT) disintegrating tablet 4 mg (4 mg Oral Given 12/12/17 1343)  iopamidol (ISOVUE-300) 61 % injection (1,500 mLs Rectal Contrast Given 12/12/17 1415)     Initial Impression / Assessment and Plan /  ED Course  I have reviewed the triage vital signs and the nursing notes.  Pertinent labs & imaging results that were available during my care of the patient were reviewed by me and considered in my medical decision making (see chart for details).    X-ray confirms diffuse increased stool burden.  I discussed the case with Dr. Melony Overly.  He recommends Gastrografin enema to rule out stricture.  We will re-discussed the case after the above.  Final Clinical Impressions(s) / ED Diagnoses   Final diagnoses:  Constipation, unspecified constipation type    No acute findings on Gastrografin enema.  Patient did have some passage of liquid stool afterwards.  No solid stool.  Plan is home.  Liquids only.  Continue 3 times daily Colace.  GoLYTELY resumed tonight.  Contact GI tomorrow if no results.  ED Discharge Orders        Ordered    polyethylene glycol (GOLYTELY) 236 g solution   Once     12/12/17 1533       Tanna Furry, MD 12/12/17 1535

## 2017-12-12 NOTE — Telephone Encounter (Signed)
Patient called stated she has tried everything and hasn't had a bowel movement in 4 weeks - please call her at [phone]

## 2017-12-12 NOTE — Discharge Instructions (Addendum)
Golytely: One half tonight.  Resume tomorrow morning if no results. Liquids only until having bowel movements. Continue Colace 3 times per day  Call Dr. Melony Overly tomorrow if no results

## 2017-12-12 NOTE — Telephone Encounter (Signed)
I have spoken with patient. She needs an enema. I advised her to go to the ED. Had no results with the Go-lytely

## 2017-12-12 NOTE — ED Notes (Signed)
Pt back from x-ray.

## 2017-12-13 ENCOUNTER — Telehealth (INDEPENDENT_AMBULATORY_CARE_PROVIDER_SITE_OTHER): Payer: Self-pay | Admitting: Internal Medicine

## 2017-12-13 NOTE — Telephone Encounter (Signed)
Wrong phone number below - correct one - [phone]

## 2017-12-13 NOTE — Telephone Encounter (Signed)
Patient would like someone to call her at [phone]

## 2017-12-14 ENCOUNTER — Telehealth (INDEPENDENT_AMBULATORY_CARE_PROVIDER_SITE_OTHER): Payer: Self-pay | Admitting: Internal Medicine

## 2017-12-14 NOTE — Telephone Encounter (Signed)
I have spoken with patient. Seen previous message.

## 2017-12-14 NOTE — Telephone Encounter (Signed)
Patient stated she took 2 laxatives as told by Dr Laural Golden - had no effect - please call her

## 2017-12-14 NOTE — Telephone Encounter (Signed)
Patient has called the office on the afternoon of 12/13/2017. She states that she still has not had a bowel movement. This was reviewed with Dr.Rehman and he recommended that the patient drink the Trilyte until she has bowel movement. This was reviewed with Deberah Castle, NP this morning. Patient states that she hs done the [place] but with no results. Patient has called the office this  morning , Karna Christmas states that she is going to talk with Dr.Rehman.

## 2017-12-14 NOTE — Telephone Encounter (Signed)
I advised to to take 2 enemas.  Hold enema as long as she can. Lie down with buttock up.

## 2017-12-20 ENCOUNTER — Telehealth (INDEPENDENT_AMBULATORY_CARE_PROVIDER_SITE_OTHER): Payer: Self-pay | Admitting: Internal Medicine

## 2017-12-20 NOTE — Telephone Encounter (Signed)
I advised her to try the MagCitrate again.  She will call in the morning.

## 2017-12-20 NOTE — Telephone Encounter (Signed)
[name] - Patient called and is still having issues , she is on for a Colonoscopy in June.

## 2017-12-20 NOTE — Telephone Encounter (Signed)
Patient called stating she is still having issues - she wants a call back at [phone]

## 2017-12-21 ENCOUNTER — Other Ambulatory Visit (INDEPENDENT_AMBULATORY_CARE_PROVIDER_SITE_OTHER): Payer: Self-pay | Admitting: *Deleted

## 2017-12-21 ENCOUNTER — Ambulatory Visit (HOSPITAL_COMMUNITY)
Admission: RE | Admit: 2017-12-21 | Discharge: 2017-12-21 | Disposition: A | Discharge status: Home / Self Care | Payer: Medicare HMO | Source: Ambulatory Visit | Attending: Internal Medicine | Admitting: Internal Medicine

## 2017-12-21 DIAGNOSIS — K5900 Constipation, unspecified: Secondary | ICD-10-CM

## 2017-12-21 NOTE — Progress Notes (Signed)
Patient aware she can go at anytime

## 2017-12-21 NOTE — Progress Notes (Unsigned)
Patient has called the office and states that she is still constipated and it has been weeks since she has been able to have a bowel movement.  Per Deberah Castle, NP - order a KUB , patient is aware.

## 2017-12-22 DIAGNOSIS — K5900 Constipation, unspecified: Secondary | ICD-10-CM | POA: Diagnosis not present

## 2017-12-27 DIAGNOSIS — F349 Persistent mood [affective] disorder, unspecified: Secondary | ICD-10-CM | POA: Diagnosis not present

## 2017-12-27 DIAGNOSIS — M545 Low back pain: Secondary | ICD-10-CM | POA: Diagnosis not present

## 2017-12-27 DIAGNOSIS — M542 Cervicalgia: Secondary | ICD-10-CM | POA: Diagnosis not present

## 2017-12-27 DIAGNOSIS — M5412 Radiculopathy, cervical region: Secondary | ICD-10-CM | POA: Diagnosis not present

## 2018-01-04 ENCOUNTER — Telehealth (INDEPENDENT_AMBULATORY_CARE_PROVIDER_SITE_OTHER): Payer: Self-pay | Admitting: Internal Medicine

## 2018-01-04 DIAGNOSIS — K5909 Other constipation: Secondary | ICD-10-CM | POA: Diagnosis not present

## 2018-01-04 NOTE — Telephone Encounter (Signed)
I have spoken with patient. Enema in am and pm. If no BM by Monday will see Tuesday.

## 2018-01-04 NOTE — Telephone Encounter (Signed)
Patient called stating she has not had a bowel movement in 7 weeks - please call her at [phone]

## 2018-01-04 NOTE — Telephone Encounter (Signed)
Forwarded to Terri 

## 2018-01-04 NOTE — Telephone Encounter (Signed)
Forwarded to [place] would you please talk with patient. She states that she has not had a BM in 7 weeks.

## 2018-01-04 NOTE — Telephone Encounter (Signed)
My note indicates it has been 5 1/2 weeks.

## 2018-01-04 NOTE — Telephone Encounter (Signed)
Keep diary of what u eat.  Take an enema same time each day or a laxative.

## 2018-01-04 NOTE — Telephone Encounter (Signed)
[name] has talked with the patient , she has appointment 01/09/2018.

## 2018-01-04 NOTE — Telephone Encounter (Signed)
Tammy, I have tried calling this patient. If she call back here ar Dr. Laural Golden recommendations.   Message left for her to return my call. Enema daily at same time daily. Diary of food intake. May need OV next Tuesday.  May need referral to [facility]

## 2018-01-05 DIAGNOSIS — K5909 Other constipation: Secondary | ICD-10-CM | POA: Insufficient documentation

## 2018-01-05 DIAGNOSIS — K5900 Constipation, unspecified: Secondary | ICD-10-CM | POA: Insufficient documentation

## 2018-01-06 ENCOUNTER — Encounter (HOSPITAL_COMMUNITY): Payer: Self-pay | Admitting: Emergency Medicine

## 2018-01-06 ENCOUNTER — Emergency Department (HOSPITAL_COMMUNITY): Payer: Medicare HMO

## 2018-01-06 ENCOUNTER — Other Ambulatory Visit: Payer: Self-pay

## 2018-01-06 ENCOUNTER — Telehealth (INDEPENDENT_AMBULATORY_CARE_PROVIDER_SITE_OTHER): Payer: Self-pay | Admitting: *Deleted

## 2018-01-06 ENCOUNTER — Observation Stay (HOSPITAL_COMMUNITY)
Admission: EM | Admit: 2018-01-06 | Discharge: 2018-01-09 | Disposition: A | Discharge status: Home / Self Care | Payer: Medicare HMO | Attending: Internal Medicine | Admitting: Internal Medicine

## 2018-01-06 DIAGNOSIS — K5909 Other constipation: Secondary | ICD-10-CM | POA: Diagnosis not present

## 2018-01-06 DIAGNOSIS — K219 Gastro-esophageal reflux disease without esophagitis: Secondary | ICD-10-CM | POA: Diagnosis not present

## 2018-01-06 DIAGNOSIS — E860 Dehydration: Secondary | ICD-10-CM | POA: Insufficient documentation

## 2018-01-06 DIAGNOSIS — Z79899 Other long term (current) drug therapy: Secondary | ICD-10-CM | POA: Diagnosis not present

## 2018-01-06 DIAGNOSIS — R739 Hyperglycemia, unspecified: Secondary | ICD-10-CM

## 2018-01-06 DIAGNOSIS — E1042 Type 1 diabetes mellitus with diabetic polyneuropathy: Secondary | ICD-10-CM | POA: Insufficient documentation

## 2018-01-06 DIAGNOSIS — F17210 Nicotine dependence, cigarettes, uncomplicated: Secondary | ICD-10-CM | POA: Insufficient documentation

## 2018-01-06 DIAGNOSIS — I10 Essential (primary) hypertension: Secondary | ICD-10-CM | POA: Diagnosis present

## 2018-01-06 DIAGNOSIS — K5900 Constipation, unspecified: Secondary | ICD-10-CM | POA: Diagnosis not present

## 2018-01-06 DIAGNOSIS — Z794 Long term (current) use of insulin: Secondary | ICD-10-CM | POA: Diagnosis not present

## 2018-01-06 DIAGNOSIS — Z8673 Personal history of transient ischemic attack (TIA), and cerebral infarction without residual deficits: Secondary | ICD-10-CM | POA: Diagnosis not present

## 2018-01-06 DIAGNOSIS — E785 Hyperlipidemia, unspecified: Secondary | ICD-10-CM | POA: Diagnosis not present

## 2018-01-06 DIAGNOSIS — N179 Acute kidney failure, unspecified: Secondary | ICD-10-CM | POA: Insufficient documentation

## 2018-01-06 DIAGNOSIS — Z800 Family history of malignant neoplasm of digestive organs: Secondary | ICD-10-CM | POA: Diagnosis not present

## 2018-01-06 DIAGNOSIS — F419 Anxiety disorder, unspecified: Secondary | ICD-10-CM | POA: Diagnosis not present

## 2018-01-06 DIAGNOSIS — F32A Depression, unspecified: Secondary | ICD-10-CM | POA: Diagnosis present

## 2018-01-06 DIAGNOSIS — G629 Polyneuropathy, unspecified: Secondary | ICD-10-CM

## 2018-01-06 DIAGNOSIS — F329 Major depressive disorder, single episode, unspecified: Secondary | ICD-10-CM | POA: Insufficient documentation

## 2018-01-06 DIAGNOSIS — E1165 Type 2 diabetes mellitus with hyperglycemia: Secondary | ICD-10-CM

## 2018-01-06 DIAGNOSIS — E1065 Type 1 diabetes mellitus with hyperglycemia: Secondary | ICD-10-CM | POA: Diagnosis not present

## 2018-01-06 DIAGNOSIS — Z720 Tobacco use: Secondary | ICD-10-CM | POA: Diagnosis present

## 2018-01-06 DIAGNOSIS — IMO0002 Reserved for concepts with insufficient information to code with codable children: Secondary | ICD-10-CM

## 2018-01-06 DIAGNOSIS — Z79890 Hormone replacement therapy (postmenopausal): Secondary | ICD-10-CM | POA: Insufficient documentation

## 2018-01-06 DIAGNOSIS — R109 Unspecified abdominal pain: Secondary | ICD-10-CM | POA: Diagnosis not present

## 2018-01-06 LAB — CBC
HEMATOCRIT: 42.7 % (ref 36.0–46.0)
Hemoglobin: 14.2 g/dL (ref 12.0–15.0)
MCH: 29.9 pg (ref 26.0–34.0)
MCHC: 33.3 g/dL (ref 30.0–36.0)
MCV: 89.9 fL (ref 78.0–100.0)
Platelets: 207 10*3/uL (ref 150–400)
RBC: 4.75 MIL/uL (ref 3.87–5.11)
RDW: 12.7 % (ref 11.5–15.5)
WBC: 7.7 10*3/uL (ref 4.0–10.5)

## 2018-01-06 LAB — URINALYSIS, ROUTINE W REFLEX MICROSCOPIC
BACTERIA UA: NONE SEEN
Bilirubin Urine: NEGATIVE
Hgb urine dipstick: NEGATIVE
KETONES UR: NEGATIVE mg/dL
LEUKOCYTES UA: NEGATIVE
Nitrite: NEGATIVE
Protein, ur: NEGATIVE mg/dL
Specific Gravity, Urine: 1.012 (ref 1.005–1.030)
pH: 7 (ref 5.0–8.0)

## 2018-01-06 LAB — CBG MONITORING, ED
Glucose-Capillary: 509 mg/dL (ref 65–99)
Glucose-Capillary: 237 mg/dL — ABNORMAL HIGH (ref 65–99)
Glucose-Capillary: 195 mg/dL — ABNORMAL HIGH (ref 65–99)

## 2018-01-06 LAB — HEPATIC FUNCTION PANEL
ALBUMIN: 3.9 g/dL (ref 3.5–5.0)
ALT: 19 U/L (ref 14–54)
AST: 17 U/L (ref 15–41)
Alkaline Phosphatase: 144 U/L — ABNORMAL HIGH (ref 38–126)
BILIRUBIN TOTAL: 0.4 mg/dL (ref 0.3–1.2)
Bilirubin, Direct: <0.1 mg/dL — ABNORMAL LOW (ref 0.1–0.5)
TOTAL PROTEIN: 6.9 g/dL (ref 6.5–8.1)

## 2018-01-06 LAB — BASIC METABOLIC PANEL
Anion gap: 9 (ref 5–15)
BUN: 12 mg/dL (ref 6–20)
CHLORIDE: 93 mmol/L — AB (ref 101–111)
CO2: 29 mmol/L (ref 22–32)
Calcium: 9.3 mg/dL (ref 8.9–10.3)
Creatinine, Ser: 1.04 mg/dL — ABNORMAL HIGH (ref 0.44–1.00)
GFR calc non Af Amer: >60 mL/min (ref >60)
Glucose, Bld: 530 mg/dL (ref 65–99)
POTASSIUM: 4.7 mmol/L (ref 3.5–5.1)
SODIUM: 131 mmol/L — AB (ref 135–145)

## 2018-01-06 LAB — LIPASE, BLOOD: LIPASE: 22 U/L (ref 11–51)

## 2018-01-06 LAB — GLUCOSE, CAPILLARY: GLUCOSE-CAPILLARY: 56 mg/dL — AB (ref 65–99)

## 2018-01-06 LAB — PHOSPHORUS: Phosphorus: 3.9 mg/dL (ref 2.5–4.6)

## 2018-01-06 LAB — MAGNESIUM: MAGNESIUM: 2 mg/dL (ref 1.7–2.4)

## 2018-01-06 MED ORDER — IOPAMIDOL (ISOVUE-300) INJECTION 61%
100.0000 mL | Freq: Once | INTRAVENOUS | Status: AC | PRN
  Administered 2018-01-06: 100 mL via INTRAVENOUS

## 2018-01-06 MED ORDER — INSULIN ASPART 100 UNIT/ML IV SOLN
10.0000 [IU] | Freq: Once | INTRAVENOUS | Status: AC
  Administered 2018-01-06: 10 [IU] via INTRAVENOUS

## 2018-01-06 MED ORDER — ACETAMINOPHEN 325 MG PO TABS
650.0000 mg | ORAL_TABLET | Freq: Four times a day (QID) | ORAL | Status: DC | PRN
  Administered 2018-01-07: 650 mg via ORAL
  Filled 2018-01-06: qty 2

## 2018-01-06 MED ORDER — PRAVASTATIN SODIUM 10 MG PO TABS
10.0000 mg | ORAL_TABLET | Freq: Every day | ORAL | Status: DC
  Administered 2018-01-07 – 2018-01-08 (×3): 10 mg via ORAL
  Filled 2018-01-06 (×3): qty 1

## 2018-01-06 MED ORDER — PREGABALIN 75 MG PO CAPS
150.0000 mg | ORAL_CAPSULE | Freq: Two times a day (BID) | ORAL | Status: DC
  Administered 2018-01-07 – 2018-01-09 (×6): 150 mg via ORAL
  Filled 2018-01-06 (×6): qty 2

## 2018-01-06 MED ORDER — DEXTROSE 50 % IV SOLN
INTRAVENOUS | Status: AC
  Administered 2018-01-07
  Filled 2018-01-06: qty 50

## 2018-01-06 MED ORDER — ONDANSETRON HCL 4 MG/2ML IJ SOLN
4.0000 mg | Freq: Four times a day (QID) | INTRAMUSCULAR | Status: DC | PRN
  Administered 2018-01-07: 4 mg via INTRAVENOUS
  Filled 2018-01-06: qty 2

## 2018-01-06 MED ORDER — HEPARIN SODIUM (PORCINE) 5000 UNIT/ML IJ SOLN
5000.0000 [IU] | Freq: Three times a day (TID) | INTRAMUSCULAR | Status: DC
  Administered 2018-01-07 – 2018-01-08 (×5): 5000 [IU] via SUBCUTANEOUS
  Filled 2018-01-06 (×6): qty 1

## 2018-01-06 MED ORDER — MILK AND MOLASSES ENEMA: 1.0000 | Freq: Every day | RECTAL | Status: DC | PRN

## 2018-01-06 MED ORDER — INSULIN GLARGINE 100 UNIT/ML ~~LOC~~ SOLN
24.0000 [IU] | Freq: Every day | SUBCUTANEOUS | Status: DC
  Administered 2018-01-07 – 2018-01-09 (×3): 24 [IU] via SUBCUTANEOUS
  Filled 2018-01-06 (×4): qty 0.24

## 2018-01-06 MED ORDER — FENTANYL CITRATE (PF) 100 MCG/2ML IJ SOLN
50.0000 ug | INTRAMUSCULAR | Status: DC | PRN
  Administered 2018-01-07: 50 ug via INTRAVENOUS
  Filled 2018-01-06: qty 2

## 2018-01-06 MED ORDER — BUPROPION HCL ER (SR) 150 MG PO TB12
150.0000 mg | ORAL_TABLET | Freq: Every day | ORAL | Status: DC
  Administered 2018-01-07 – 2018-01-08 (×2): 150 mg via ORAL
  Filled 2018-01-06 (×5): qty 1

## 2018-01-06 MED ORDER — INSULIN ASPART 100 UNIT/ML ~~LOC~~ SOLN
SUBCUTANEOUS | Status: AC
  Filled 2018-01-06: qty 1

## 2018-01-06 MED ORDER — SODIUM CHLORIDE 0.45 % IV SOLN
INTRAVENOUS | Status: DC
  Administered 2018-01-06: 23:00:00 via INTRAVENOUS

## 2018-01-06 MED ORDER — INSULIN ASPART 100 UNIT/ML ~~LOC~~ SOLN
0.0000 [IU] | Freq: Three times a day (TID) | SUBCUTANEOUS | Status: DC
  Administered 2018-01-07: 2 [IU] via SUBCUTANEOUS

## 2018-01-06 MED ORDER — PANTOPRAZOLE SODIUM 40 MG PO TBEC
40.0000 mg | DELAYED_RELEASE_TABLET | Freq: Every day | ORAL | Status: DC
  Administered 2018-01-07 – 2018-01-09 (×3): 40 mg via ORAL
  Filled 2018-01-06 (×3): qty 1

## 2018-01-06 MED ORDER — SODIUM CHLORIDE 0.9 % IV BOLUS
1000.0000 mL | Freq: Once | INTRAVENOUS | Status: AC
  Administered 2018-01-06: 1000 mL via INTRAVENOUS

## 2018-01-06 MED ORDER — ACETAMINOPHEN 650 MG RE SUPP: 650.0000 mg | Freq: Four times a day (QID) | RECTAL | Status: DC | PRN

## 2018-01-06 MED ORDER — ONDANSETRON HCL 4 MG PO TABS: 4.0000 mg | ORAL_TABLET | Freq: Four times a day (QID) | ORAL | Status: DC | PRN

## 2018-01-06 MED ORDER — DOCUSATE SODIUM 100 MG PO CAPS
100.0000 mg | ORAL_CAPSULE | Freq: Two times a day (BID) | ORAL | Status: DC
  Administered 2018-01-07 – 2018-01-09 (×6): 100 mg via ORAL
  Filled 2018-01-06 (×6): qty 1

## 2018-01-06 MED ORDER — DIAZEPAM 5 MG PO TABS: 10.0000 mg | ORAL_TABLET | Freq: Three times a day (TID) | ORAL | Status: DC

## 2018-01-06 MED ORDER — ESTRADIOL 1 MG PO TABS
1.0000 mg | ORAL_TABLET | Freq: Every day | ORAL | Status: DC
  Administered 2018-01-07 – 2018-01-09 (×3): 1 mg via ORAL
  Filled 2018-01-06 (×3): qty 1

## 2018-01-06 MED ORDER — DIAZEPAM 5 MG PO TABS
10.0000 mg | ORAL_TABLET | Freq: Four times a day (QID) | ORAL | Status: DC | PRN
  Administered 2018-01-07 – 2018-01-09 (×5): 10 mg via ORAL
  Filled 2018-01-06 (×6): qty 2

## 2018-01-06 MED ORDER — NICOTINE 14 MG/24HR TD PT24: 14.0000 mg | MEDICATED_PATCH | Freq: Every day | TRANSDERMAL | Status: DC | PRN

## 2018-01-06 NOTE — ED Notes (Signed)
CBG 509 in triage.

## 2018-01-06 NOTE — ED Triage Notes (Signed)
Patient states her CBG at home was greater than 600. States she took insulin and sugar did not drop. Also complaining of abdominal pain and nausea. States she has had no bowel movements x 7 weeks.

## 2018-01-06 NOTE — Telephone Encounter (Signed)
Per Dr.Rehman - patient is to be set up for Colonoscopy . She is for 01/19/2018. He ask that she take the Miralax twice a day. She is to use the suppository every morning. She is to keep a stool and food diary. If she is experiencing Nausea and Vomiting she will need to go to the ED.  Patient was called and made aware.

## 2018-01-06 NOTE — H&P (Signed)
History and Physical    [name] WER:[id number] DOB: 21-Aug-1970 DOA: 01/06/2018  PCP: Karsten Ro, DO   Patient coming from: Home.  I have personally briefly reviewed patient's old medical records in [place]  Chief Complaint: Elevated blood glucose, abdominal pain and nausea.Marland Kitchen  HPI: [name] is a 47 y.o. female with medical history significant of abdominal wall mass of right flank, benign paroxysmal vertigo, chronic neck pain, depression with anxiety, type 1 diabetes since age 7, chronic back pain, GERD, hiatal hernia, hyperlipidemia, hypertension, IBS, migraine headaches, postherpetic headache, diabetic peripheral neuropathy, history of TIA, tobacco use who is coming to the emergency after her CBG read her showed greater than 600 at home.  She then gave herself some subcutaneous insulin, but her CBG did not decrease.  She also has been complaining of abdominal pain, nausea associated with 4 episodes of emesis 2 yesterday and constipation for the past 7 weeks.  She denies fever, chills, sore throat, chest pain, dyspnea, palpitations, diaphoresis, PND, orthopnea or pitting edema of the lower extremities.  No diarrhea, melena or hematochezia.  No dysuria, frequency or hematuria.  Denies polyuria, polydipsia, polyphagia.  Denies pruritus skin rashes.  ED Course: Initial temperature 37.9 F, pulse 84, respirations 20, blood pressure 113/72 mmHg and O2 sat 98% on room air.  Her urinalysis showed glucosuria more than 500 mg/dL.  CBC was normal.  Sodium 131, potassium 4.7, chloride 93 and CO2 29 mmol S/L.  Her glucose 530, BUN 12, creatinine 1.04 (on 12/12/2017 creatinine was 0.069), magnesium 2.0, phosphorus 3.9 and calcium 9.3 mg/dL.  Lipase was 22 units/L.  LFTs show an increase alkaline phosphatase at 144 units/L, but all other values were normal.  Imaging: CT of the abdomen/pelvis with contrast showed a prominent stool burden in the colon, but no acute  abnormality.  Review of Systems: As per HPI otherwise 10 point review of systems negative.    Past Medical History:  Diagnosis Date  . Abdominal wall mass of right flank 04/11/13  . Abrasion of leg with infection 02/18/14  . Anxiety   . Benign paroxysmal vertigo 09/04/13  . Bronchitis, acute 02/28/14  . Bunion of great toe 04/11/13  . Cervicalgia 06/13/14  . Depression   . Diabetes ([place]) 11/08/2017  . Diabetes mellitus without complication ([place])   . Dorsalgia 05/17/14  . Dysuria 03/27/14  . Gastro-esophageal reflux disease without esophagitis 01/28/14  . Gastroenteritis 01/28/14  . GERD (gastroesophageal reflux disease)   . Hiatal hernia   . Hyperlipidemia   . Hypertension   . IBS (irritable bowel syndrome)   . Migraine headache   . Neuropathy   . Pain in thoracic spine 06/13/14  . 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 PE (physical exam), annual   . Peripheral neuropathy 11/05/13  . Subungual hematoma of digit of hand   . Tendonitis 03/07/13  . TIA (transient ischemic attack)   . Tobacco use   . Vertigo     Past Surgical History:  Procedure Laterality Date  . ABDOMINAL HYSTERECTOMY    . APPENDECTOMY    . CARDIAC CATHETERIZATION   last in 2009   x 4, normal coronary arteries  . CARDIAC CATHETERIZATION N/A 03/26/2015   Procedure: Left Heart Cath and Coronary Angiography;  Surgeon: Wellington Hampshire, MD;  Location: [facility];  Service: Cardiovascular;  Laterality: N/A;  . CESAREAN SECTION    . CHOLECYSTECTOMY    . CYST EXCISION     right  breast  . TUBAL LIGATION       reports that she has been smoking cigarettes.  She has a 5.50 pack-year smoking history. She has never used smokeless tobacco. She reports that she drinks alcohol. She reports that she does not use drugs.  Allergies  Allergen Reactions  . Zocor [Simvastatin] Other (See Comments)    Muscle aches and pains  . Clindamycin/Lincomycin Rash  . Codeine Rash  . Penicillins Rash    Has patient had a PCN  reaction causing immediate rash, facial/tongue/throat swelling, SOB or lightheadedness with hypotension: Yes Has patient had a PCN reaction causing severe rash involving mucus membranes or skin necrosis: Yes Has patient had a PCN reaction that required hospitalization: Yes Has patient had a PCN reaction occurring within the last 10 years: No If all of the above answers are "NO", then may proceed with Cephalosporin use.   . Sulfa Antibiotics Rash         Family History  Problem Relation Age of Onset  . Stroke Mother 61  . Heart attack Mother 6  . Cancer Sister        colon  . Heart failure Maternal Grandmother 65  . Cancer Maternal Grandfather        prostate  . Heart failure Paternal Grandmother 33  . Heart failure Paternal Grandfather 60   Prior to Admission medications   Medication Sig Start Date End Date Taking? Authorizing Provider  buPROPion (ZYBAN) 150 MG 12 hr tablet Take 150 mg by mouth daily.   Yes [provider]  cyclobenzaprine (FLEXERIL) 10 MG tablet Take 10 mg by mouth as needed.  01/12/17  Yes [provider]  diazepam (VALIUM) 10 MG tablet Take 1 tablet by mouth 4 (four) times daily - after meals and at bedtime. 12/27/17  Yes [provider]  docusate sodium (COLACE) 100 MG capsule Take 100 mg by mouth 2 (two) times daily.   Yes [provider]  EPIPEN 2-PAK 0.3 MG/0.3ML SOAJ injection Inject 0.3 mg into the muscle once.  02/13/16  Yes [provider]  Insulin Glargine (LANTUS SOLOSTAR) 100 UNIT/ML Solostar Pen Inject 24 Units into the skin daily before breakfast. 04/08/15  Yes Philemon Kingdom, MD  insulin lispro (HUMALOG KWIKPEN) 100 UNIT/ML KiwkPen Inject 10 Units into the skin 3 (three) times daily with meals. + SLIDING SCALE   Yes [provider]  lisinopril (PRINIVIL,ZESTRIL) 5 MG tablet Take 5 mg by mouth daily. 06/10/15  Yes [provider]  LYRICA 150 MG capsule Take 150 mg by mouth 2 (two) times  daily.  08/19/15  Yes [provider]  Melatonin 10 MG CAPS Take 10 mg by mouth at bedtime.    Yes [provider]  Omega-3 Fatty Acids (FISH OIL) 1200 MG CPDR Take 1 capsule by mouth daily.    Yes [provider]  omeprazole (PRILOSEC) 40 MG capsule Take 40 mg by mouth as needed.   Yes [provider]  pravastatin (PRAVACHOL) 10 MG tablet Take 10 mg by mouth at bedtime. 05/29/15  Yes [provider]  estradiol (ESTRACE) 1 MG tablet Take 1 mg by mouth daily.    [provider]  polyethylene glycol (MIRALAX / GLYCOLAX) packet Take 17 g by mouth daily. 12/10/17   Ripley Fraise, MD  polyethylene glycol-electrolytes (NULYTELY/GOLYTELY) 420 g solution Drink one 8 ounce glass every hour until you have a bowel movement 12/09/17   Milton Ferguson, MD    Physical Exam: Vitals:  01/06/18 1846 01/06/18 2030 01/06/18 2207 01/06/18 2313  BP: 113/72 108/73 98/63 117/78  Pulse: 84 70 69 70  Resp: 20  18 20   Temp: 97.9 F (36.6 C)  97.7 F (36.5 C) (!) 97.4 F (36.3 C)  TempSrc: Oral  Oral Oral  SpO2: 98% 98% 97% 100%  Weight:      Height:        Constitutional: NAD, calm, comfortable Eyes: PERRL, lids and conjunctivae normal ENMT: Mucous membranes and lips are dry. Posterior pharynx clear of any exudate or lesions.Normal dentition.  Neck: normal, supple, no masses, no thyromegaly Respiratory: clear to auscultation bilaterally, no wheezing, no crackles. Normal respiratory effort. No accessory muscle use.  Cardiovascular: Regular rate and rhythm, no murmurs / rubs / gallops. No extremity edema. 2+ pedal pulses. No carotid bruits.  Abdomen: Soft, mild diffuse tenderness, no guarding/rebound/masses palpated. No hepatosplenomegaly. Bowel sounds positive.  Musculoskeletal: no clubbing / cyanosis. No joint deformity upper and lower extremities. Good ROM, no contractures. Normal muscle tone.  Skin: no rashes, lesions, ulcers on limited skin  examination. Neurologic: CN 2-12 grossly intact. Sensation intact, DTR normal. Strength 5/5 in all 4.  Psychiatric: Normal judgment and insight. Alert and oriented x 3. Normal mood.    Labs on Admission: I have personally reviewed following labs and imaging studies  CBC: Recent Labs  Lab 01/06/18 1925  WBC 7.7  HGB 14.2  HCT 42.7  MCV 89.9  PLT 272   Basic Metabolic Panel: Recent Labs  Lab 01/06/18 1925  NA 131*  K 4.7  CL 93*  CO2 29  GLUCOSE 530*  BUN 12  CREATININE 1.04*  CALCIUM 9.3  MG 2.0  PHOS 3.9   GFR: Estimated Creatinine Clearance: 72.2 mL/min (A) (by C-G formula based on SCr of 1.04 mg/dL (H)). Liver Function Tests: Recent Labs  Lab 01/06/18 1925  AST 17  ALT 19  ALKPHOS 144*  BILITOT 0.4  PROT 6.9  ALBUMIN 3.9   Recent Labs  Lab 01/06/18 1925  LIPASE 22   No results for input(s): AMMONIA in the last 168 hours. Coagulation Profile: No results for input(s): INR, PROTIME in the last 168 hours. Cardiac Enzymes: No results for input(s): CKTOTAL, CKMB, CKMBINDEX, TROPONINI in the last 168 hours. BNP (last 3 results) No results for input(s): PROBNP in the last 8760 hours. HbA1C: No results for input(s): HGBA1C in the last 72 hours. CBG: Recent Labs  Lab 01/06/18 1847 01/06/18 2224 01/06/18 2238  GLUCAP 509* 237* 195*   Lipid Profile: No results for input(s): CHOL, HDL, LDLCALC, TRIG, CHOLHDL, LDLDIRECT in the last 72 hours. Thyroid Function Tests: No results for input(s): TSH, T4TOTAL, FREET4, T3FREE, THYROIDAB in the last 72 hours. Anemia Panel: No results for input(s): VITAMINB12, FOLATE, FERRITIN, TIBC, IRON, RETICCTPCT in the last 72 hours. Urine analysis:    Component Value Date/Time   COLORURINE COLORLESS (A) 01/06/2018 2101   APPEARANCEUR CLEAR 01/06/2018 2101   LABSPEC 1.012 01/06/2018 2101   PHURINE 7.0 01/06/2018 2101   GLUCOSEU >=500 (A) 01/06/2018 2101   HGBUR NEGATIVE 01/06/2018 2101   BILIRUBINUR NEGATIVE 01/06/2018  2101   KETONESUR NEGATIVE 01/06/2018 2101   PROTEINUR NEGATIVE 01/06/2018 2101   UROBILINOGEN 0.2 12/21/2014 0050   NITRITE NEGATIVE 01/06/2018 2101   LEUKOCYTESUR NEGATIVE 01/06/2018 2101    Radiological Exams on Admission: Ct Abdomen Pelvis W Contrast  Result Date: 01/06/2018 CLINICAL DATA:  Lower abdominal pain and nausea. Abdominal distention. No bowel movements for the past 7 weeks. EXAM:  CT ABDOMEN AND PELVIS WITH CONTRAST TECHNIQUE: Multidetector CT imaging of the abdomen and pelvis was performed using the standard protocol following bolus administration of intravenous contrast. CONTRAST:  184mL ISOVUE-300 IOPAMIDOL (ISOVUE-300) INJECTION 61% COMPARISON:  12/09/2017. FINDINGS: Lower chest: Minimal bilateral dependent atelectasis. Hepatobiliary: No focal liver abnormality is seen. Status post cholecystectomy. No biliary dilatation. Pancreas: Diffuse pancreatic atrophy. Spleen: Normal in size without focal abnormality. Adrenals/Urinary Tract: Adrenal glands are unremarkable. Kidneys are normal, without renal calculi, focal lesion, or hydronephrosis. Bladder is unremarkable. Stomach/Bowel: Prominent stool throughout the colon. Surgically absent appendix. Unremarkable stomach and small bowel. Vascular/Lymphatic: No significant vascular findings are present. No enlarged abdominal or pelvic lymph nodes. Reproductive: Status post hysterectomy. No adnexal masses. Other: No abdominal wall hernia or abnormality. No abdominopelvic ascites. Musculoskeletal: Unremarkable bones. IMPRESSION: No acute abnormality.  Prominent stool throughout the colon. Electronically Signed   By: Claudie Revering M.D.   On: 01/06/2018 21:33    EKG: Independently reviewed.    Assessment/Plan Principal Problem:   Uncontrolled diabetes mellitus ([place]) Hemoglobin A1c 11.4% on 06/29/2016. Observation/MedSurg. Continue IV fluids. Keep n.p.o. for now. Monitor CBG before bedtime and a 400. Then CBG monitoring before meals and  bedtime. Continue Lantus 24 units SQ in the morning. Check hemoglobin A1c.  Active Problems:   Hypertension Hold lisinopril for now. Continue IV hydration. Resume lisinopril once rehydrated renal function back to baseline.    Hyperlipidemia Continue pravastatin 10 mg p.o. daily at bedtime. Monitor LFTs as needed. Fasting lipid panel follow-up per primary.    Anxiety Continue diazepam.    Gastro-esophageal reflux disease without esophagitis Pantoprazole 40 mg p.o. daily.    Family hx of colon cancer To be discussed with GI.    Depression Continue bupropion 150 mg p.o. daily. Diazepam as needed for anxiety.    Peripheral neuropathy Continue Lyrica 150 mg p.o. twice daily.    Tobacco abuse Nicotine replacement therapy ordered. Tobacco cessation information to be provided by the staff.    DVT prophylaxis: Heparin SQ. Code Status: Full code. Family Communication:  Disposition Plan: Observation for IV hydration and GI consultation in the morning. Consults called: Routine gastroenterology consult. Admission status: Observation/MedSurg.   Reubin Milan MD [facility] Pager [phone].  If 7PM-7AM, please contact night-coverage [email or web address] Password [email or web address]  01/06/2018, 11:15 PM

## 2018-01-06 NOTE — ED Provider Notes (Signed)
[facility] EMERGENCY DEPARTMENT Provider Note   CSN: [id number] Arrival date & time: 01/06/18  1826     History   Chief Complaint Chief Complaint  Patient presents with  . Hyperglycemia    HPI Shanyce Daris is a 47 y.o. female.  HPI Patient states she has not had a bowel movement in 7 weeks.  She presents with abdominal distention, nausea and ongoing constipation.  She also has had decreased p.o. Intake.  Noted to have elevated blood sugar at home greater than 600.  Denies fever or chills.  Endorses generalized fatigue. Past Medical History:  Diagnosis Date  . Abdominal wall mass of right flank 04/11/13  . Abrasion of leg with infection 02/18/14  . Anxiety   . Benign paroxysmal vertigo 09/04/13  . Bronchitis, acute 02/28/14  . Bunion of great toe 04/11/13  . Cervicalgia 06/13/14  . Depression   . Diabetes ([place]) 11/08/2017  . Diabetes mellitus without complication ([place])   . Dorsalgia 05/17/14  . Dysuria 03/27/14  . Gastro-esophageal reflux disease without esophagitis 01/28/14  . Gastroenteritis 01/28/14  . GERD (gastroesophageal reflux disease)   . Hiatal hernia   . Hyperlipidemia   . Hypertension   . IBS (irritable bowel syndrome)   . Migraine headache   . Neuropathy   . Pain in thoracic spine 06/13/14  . 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 PE (physical exam), annual   . Peripheral neuropathy 11/05/13  . Subungual hematoma of digit of hand   . Tendonitis 03/07/13  . TIA (transient ischemic attack)   . Tobacco use   . Vertigo     Patient Active Problem List   Diagnosis Date Noted  . Family hx of colon cancer 11/10/2017  . Diabetes ([place]) 11/08/2017  . Chest pain 03/25/2015  . Hypertension   . Hyperlipidemia   . Anxiety   . Pain in the chest   . Essential hypertension   . 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 TIA (transient ischemic attack) 08/17/2014  . Tobacco abuse   . Gastro-esophageal reflux disease  without esophagitis 01/28/2014    Past Surgical History:  Procedure Laterality Date  . ABDOMINAL HYSTERECTOMY    . APPENDECTOMY    . CARDIAC CATHETERIZATION   last in 2009   x 4, normal coronary arteries  . CARDIAC CATHETERIZATION N/A 03/26/2015   Procedure: Left Heart Cath and Coronary Angiography;  Surgeon: Wellington Hampshire, MD;  Location: [facility];  Service: Cardiovascular;  Laterality: N/A;  . CESAREAN SECTION    . CHOLECYSTECTOMY    . CYST EXCISION     right breast  . TUBAL LIGATION       OB History   None      Home Medications    Prior to Admission medications   Medication Sig Start Date End Date Taking? Authorizing Provider  buPROPion (ZYBAN) 150 MG 12 hr tablet Take 150 mg by mouth daily.    [provider]  cyclobenzaprine (FLEXERIL) 10 MG tablet Take 10 mg by mouth as needed.  01/12/17   [provider]  docusate sodium (COLACE) 100 MG capsule Take 100 mg by mouth 2 (two) times daily.    [provider]  EPIPEN 2-PAK 0.3 MG/0.3ML SOAJ injection Inject 0.3 mg into the muscle once.  02/13/16   [provider]  estradiol (ESTRACE) 1 MG tablet Take 1 mg by mouth daily.    [provider]  Insulin Glargine (  LANTUS SOLOSTAR) 100 UNIT/ML Solostar Pen Inject 24 Units into the skin daily before breakfast. Patient taking differently: Inject 25 Units into the skin daily before breakfast.  04/08/15   Philemon Kingdom, MD  insulin lispro (HUMALOG KWIKPEN) 100 UNIT/ML KiwkPen Inject 10 Units into the skin 3 (three) times daily with meals. + SLIDING SCALE    [provider]  lisinopril (PRINIVIL,ZESTRIL) 5 MG tablet Take 5 mg by mouth daily. 06/10/15   [provider]  LYRICA 150 MG capsule Take 150 mg by mouth 2 (two) times daily.  08/19/15   [provider]  Melatonin 10 MG CAPS Take 10 mg by mouth at bedtime.     [provider]  Multiple Vitamin (MULTIVITAMIN WITH MINERALS) TABS Take 1 tablet by  mouth every morning.    [provider]  Omega-3 Fatty Acids (FISH OIL) 1200 MG CPDR Take 1 capsule by mouth daily.     [provider]  omeprazole (PRILOSEC) 40 MG capsule Take 40 mg by mouth as needed.    [provider]  polyethylene glycol (MIRALAX / GLYCOLAX) packet Take 17 g by mouth daily. 12/10/17   Ripley Fraise, MD  polyethylene glycol-electrolytes (NULYTELY/GOLYTELY) 420 g solution Drink one 8 ounce glass every hour until you have a bowel movement 12/09/17   Milton Ferguson, MD  pravastatin (PRAVACHOL) 10 MG tablet Take 10 mg by mouth at bedtime. 05/29/15   [provider]    Family History Family History  Problem Relation Age of Onset  . Stroke Mother 49  . Heart attack Mother 38  . Cancer Sister        colon  . Heart failure Maternal Grandmother 65  . Cancer Maternal Grandfather        prostate  . Heart failure Paternal Grandmother 33  . Heart failure Paternal Grandfather 42    Social History Social History   Tobacco Use  . Smoking status: Current Every Day Smoker    Packs/day: 0.50    Years: 11.00    Pack years: 5.50    Types: Cigarettes  . Smokeless tobacco: Never Used  . Tobacco comment: patient is aware that she needs to quit smoking  Substance Use Topics  . Alcohol use: Yes    Alcohol/week: 0.0 oz    Comment: occasionally  . Drug use: No     Allergies   Zocor [simvastatin]; Clindamycin/lincomycin; Codeine; Penicillins; and Sulfa antibiotics   Review of Systems Review of Systems  Constitutional: Positive for appetite change and fatigue. Negative for chills and fever.  Eyes: Negative for visual disturbance.  Respiratory: Negative for shortness of breath.   Cardiovascular: Negative for chest pain.  Gastrointestinal: Positive for abdominal distention, constipation, nausea and vomiting.  Genitourinary: Positive for frequency. Negative for dysuria, flank pain, hematuria and pelvic pain.  Musculoskeletal: Negative for  back pain, myalgias, neck pain and neck stiffness.  Skin: Negative for rash and wound.  Neurological: Negative for dizziness, weakness, light-headedness, numbness and headaches.  All other systems reviewed and are negative.    Physical Exam Updated Vital Signs BP 108/73   Pulse 70   Temp 97.9 F (36.6 C) (Oral)   Resp 20   Ht 5\' 7"  (1.702 m)   Wt 78.5 kg (173 lb)   SpO2 98%   BMI 27.10 kg/m   Physical Exam  Constitutional: She is oriented to person, place, and time. She appears well-developed and well-nourished.  HENT:  Head: Normocephalic and atraumatic.  Dry mucous membranes  Eyes:  Pupils are equal, round, and reactive to light. EOM are normal.  Neck: Normal range of motion. Neck supple.  Cardiovascular: Normal rate and regular rhythm. Exam reveals no gallop and no friction rub.  No murmur heard. Pulmonary/Chest: Effort normal and breath sounds normal. No stridor. No respiratory distress. She has no wheezes. She has no rales. She exhibits no tenderness.  Abdominal: Soft. Bowel sounds are normal. There is tenderness. There is no rebound and no guarding.  Diffuse abdominal tenderness without focality, rebound or guarding.  Mild distention.  Musculoskeletal: Normal range of motion. She exhibits no edema or tenderness.  No CVA tenderness bilaterally.  Neurological: She is alert and oriented to person, place, and time.  Moves all extremities without focal deficit.  Sensation fully intact.  Skin: Skin is warm and dry. No rash noted. No erythema.  Psychiatric: Her behavior is normal.  Flat affect.  Nursing note and vitals reviewed.    ED Treatments / Results  Labs (all labs ordered are listed, but only abnormal results are displayed) Labs Reviewed  BASIC METABOLIC PANEL - Abnormal; Notable for the following components:      Result Value   Sodium 131 (*)    Chloride 93 (*)    Glucose, Bld 530 (*)    Creatinine, Ser 1.04 (*)    All other components within normal limits    URINALYSIS, ROUTINE W REFLEX MICROSCOPIC - Abnormal; Notable for the following components:   Color, Urine COLORLESS (*)    Glucose, UA >=500 (*)    All other components within normal limits  HEPATIC FUNCTION PANEL - Abnormal; Notable for the following components:   Alkaline Phosphatase 144 (*)    Bilirubin, Direct <0.1 (*)    All other components within normal limits  CBG MONITORING, ED - Abnormal; Notable for the following components:   Glucose-Capillary 509 (*)    All other components within normal limits  CBC  LIPASE, BLOOD  MAGNESIUM  PHOSPHORUS  CBG MONITORING, ED    EKG None  Radiology Ct Abdomen Pelvis W Contrast  Result Date: 01/06/2018 CLINICAL DATA:  Lower abdominal pain and nausea. Abdominal distention. No bowel movements for the past 7 weeks. EXAM: CT ABDOMEN AND PELVIS WITH CONTRAST TECHNIQUE: Multidetector CT imaging of the abdomen and pelvis was performed using the standard protocol following bolus administration of intravenous contrast. CONTRAST:  141mL ISOVUE-300 IOPAMIDOL (ISOVUE-300) INJECTION 61% COMPARISON:  12/09/2017. FINDINGS: Lower chest: Minimal bilateral dependent atelectasis. Hepatobiliary: No focal liver abnormality is seen. Status post cholecystectomy. No biliary dilatation. Pancreas: Diffuse pancreatic atrophy. Spleen: Normal in size without focal abnormality. Adrenals/Urinary Tract: Adrenal glands are unremarkable. Kidneys are normal, without renal calculi, focal lesion, or hydronephrosis. Bladder is unremarkable. Stomach/Bowel: Prominent stool throughout the colon. Surgically absent appendix. Unremarkable stomach and small bowel. Vascular/Lymphatic: No significant vascular findings are present. No enlarged abdominal or pelvic lymph nodes. Reproductive: Status post hysterectomy. No adnexal masses. Other: No abdominal wall hernia or abnormality. No abdominopelvic ascites. Musculoskeletal: Unremarkable bones. IMPRESSION: No acute abnormality.  Prominent stool  throughout the colon. Electronically Signed   By: Claudie Revering M.D.   On: 01/06/2018 21:33    Procedures Procedures (including critical care time)  Medications Ordered in ED Medications  sodium chloride 0.9 % bolus 1,000 mL (has no administration in time range)  0.45 % sodium chloride infusion (has no administration in time range)  insulin aspart (novoLOG) injection 10 Units (10 Units Intravenous Given 01/06/18 2041)  sodium chloride 0.9 % bolus 1,000 mL (1,000 mLs  Intravenous New Bag/Given 01/06/18 2052)  iopamidol (ISOVUE-300) 61 % injection 100 mL (100 mLs Intravenous Contrast Given 01/06/18 2114)     Initial Impression / Assessment and Plan / ED Course  I have reviewed the triage vital signs and the nursing notes.  Pertinent labs & imaging results that were available during my care of the patient were reviewed by me and considered in my medical decision making (see chart for details).     She with evidence of constipation but no acute findings.  Patient is clinically dehydrated with a bump in creatinine.  Given insulin and IV fluids.  Discussed with Dr. Laural Golden who asked that the patient be admitted to the hospitalist and he will consult on the patient in the morning.  Discussed with hospitalist will see patient in the emergency department  Final Clinical Impressions(s) / ED Diagnoses   Final diagnoses:  Hyperglycemia  Dehydration  Constipation, unspecified constipation type    ED Discharge Orders    None       Julianne Rice, MD 01/06/18 2157

## 2018-01-07 DIAGNOSIS — R1013 Epigastric pain: Secondary | ICD-10-CM | POA: Diagnosis not present

## 2018-01-07 DIAGNOSIS — K5900 Constipation, unspecified: Secondary | ICD-10-CM | POA: Diagnosis not present

## 2018-01-07 DIAGNOSIS — K228 Other specified diseases of esophagus: Secondary | ICD-10-CM | POA: Diagnosis not present

## 2018-01-07 DIAGNOSIS — Z800 Family history of malignant neoplasm of digestive organs: Secondary | ICD-10-CM | POA: Diagnosis not present

## 2018-01-07 DIAGNOSIS — E1065 Type 1 diabetes mellitus with hyperglycemia: Secondary | ICD-10-CM | POA: Diagnosis not present

## 2018-01-07 DIAGNOSIS — F419 Anxiety disorder, unspecified: Secondary | ICD-10-CM | POA: Diagnosis not present

## 2018-01-07 DIAGNOSIS — R110 Nausea: Secondary | ICD-10-CM | POA: Diagnosis not present

## 2018-01-07 LAB — GLUCOSE, CAPILLARY
GLUCOSE-CAPILLARY: 108 mg/dL — AB (ref 65–99)
GLUCOSE-CAPILLARY: 114 mg/dL — AB (ref 65–99)
GLUCOSE-CAPILLARY: 142 mg/dL — AB (ref 65–99)
GLUCOSE-CAPILLARY: 63 mg/dL — AB (ref 65–99)
GLUCOSE-CAPILLARY: 508 mg/dL — AB (ref 65–99)
Glucose-Capillary: 123 mg/dL — ABNORMAL HIGH (ref 65–99)
Glucose-Capillary: 172 mg/dL — ABNORMAL HIGH (ref 65–99)
Glucose-Capillary: 498 mg/dL — ABNORMAL HIGH (ref 65–99)
Glucose-Capillary: 84 mg/dL (ref 65–99)

## 2018-01-07 MED ORDER — HYDROCODONE-ACETAMINOPHEN 5-325 MG PO TABS
1.0000 | ORAL_TABLET | ORAL | Status: DC | PRN
  Administered 2018-01-07: 1 via ORAL
  Filled 2018-01-07: qty 1

## 2018-01-07 MED ORDER — INSULIN ASPART 100 UNIT/ML ~~LOC~~ SOLN
5.0000 [IU] | Freq: Once | SUBCUTANEOUS | Status: AC
  Administered 2018-01-07: 5 [IU] via SUBCUTANEOUS

## 2018-01-07 MED ORDER — INSULIN ASPART 100 UNIT/ML ~~LOC~~ SOLN
0.0000 [IU] | SUBCUTANEOUS | Status: DC
  Administered 2018-01-07: 9 [IU] via SUBCUTANEOUS
  Administered 2018-01-07: 1 [IU] via SUBCUTANEOUS
  Administered 2018-01-08: 7 [IU] via SUBCUTANEOUS
  Administered 2018-01-08: 3 [IU] via SUBCUTANEOUS

## 2018-01-07 MED ORDER — HYDROCODONE-ACETAMINOPHEN 5-325 MG PO TABS
1.0000 | ORAL_TABLET | Freq: Four times a day (QID) | ORAL | Status: DC | PRN
  Administered 2018-01-07 – 2018-01-09 (×4): 1 via ORAL
  Filled 2018-01-07 (×5): qty 1

## 2018-01-07 MED ORDER — DEXTROSE 5 % IV SOLN
INTRAVENOUS | Status: DC
  Administered 2018-01-07: 01:00:00 via INTRAVENOUS

## 2018-01-07 MED ORDER — DEXTROSE-NACL 5-0.45 % IV SOLN
INTRAVENOUS | Status: DC
  Administered 2018-01-07: 08:00:00 via INTRAVENOUS

## 2018-01-07 MED ORDER — LUBIPROSTONE 24 MCG PO CAPS
24.0000 ug | ORAL_CAPSULE | Freq: Two times a day (BID) | ORAL | Status: DC
  Administered 2018-01-07 – 2018-01-09 (×5): 24 ug via ORAL
  Filled 2018-01-07 (×5): qty 1

## 2018-01-07 NOTE — Progress Notes (Addendum)
Received a verbal order from Dr. Laural Golden for the patient to be ordered hydrocodone/acetamenophen 5-325mg  Q6 hours PRN for moderate pain.

## 2018-01-07 NOTE — Progress Notes (Signed)
PROGRESS NOTE    [name]  YBO:[id number] DOB: 10-02-1970 DOA: 01/06/2018 PCP: Karsten Ro, DO   Brief Narrative:   [name] is a 47 y.o. female with medical history significant of abdominal wall mass of right flank, benign paroxysmal vertigo, chronic neck pain, depression with anxiety, type 1 diabetes since age 73, chronic back pain, GERD, hiatal hernia, hyperlipidemia, hypertension, IBS, migraine headaches, postherpetic headache, diabetic peripheral neuropathy, history of TIA, tobacco use who is coming to the emergency after her CBG read her showed greater than 600 at home.    She was also noted to have severe constipation with no bowel movement in the last 7 weeks and has also had some abdominal pain with nausea and vomiting over the last 2 days.  CT of her abdomen and pelvis has demonstrated a high stool burden.   Assessment & Plan:   Principal Problem:   Uncontrolled diabetes mellitus ([place]) Active Problems:   Tobacco abuse   Hypertension   Hyperlipidemia   Anxiety   Gastro-esophageal reflux disease without esophagitis   Family hx of colon cancer   Depression   Peripheral neuropathy   1. Severe hyperglycemia secondary to uncontrolled type 1 diabetes-resolved.  Continue close blood glucose monitoring with sliding scale insulin and every 4 hours Accu-Cheks.  Maintain on Lantus.  D5 IV fluid until diet can be advanced after GI evaluation. 2. Severe constipation with likely fecal impaction.  Further evaluation per GI. 3. Hypertension.  Continue IV hydration and avoid ACE inhibitor for now.  Monitor blood pressures. 4. AKI secondary to dehydration.  Continue to avoid nephrotoxic agents and monitor with repeat renal panel.  Continue IV hydration. 5. Dyslipidemia.  Continue pravastatin. 6. Anxiety/depression.  Continue diazepam appropriate on. 7. GERD.  Continue PPI. 8. Peripheral neuropathy.  Continue Lyrica twice a day. 9. Tobacco abuse.  Nicotine replacement and  cessation counseling.   DVT prophylaxis:Heparin  Code Status: Full Family Communication: Husband at bedside Disposition Plan: GI evaluation for stool impaction   Consultants:   GI Dr. Laural Golden  Procedures:   None  Antimicrobials:   None   Subjective: Patient seen and evaluated today with no new acute complaints or concerns.  She was noted to have some mild hypoglycemia overnight which has corrected with administration of D5 IV fluid.  She currently has complaints of abdominal bloating, but nothing acute.  Objective: Vitals:   01/06/18 2030 01/06/18 2207 01/06/18 2313 01/07/18 0540  BP: 108/73 98/63 117/78 96/64  Pulse: 70 69 70 69  Resp:  18 20 18   Temp:  97.7 F (36.5 C) (!) 97.4 F (36.3 C) 97.6 F (36.4 C)  TempSrc:  Oral Oral Oral  SpO2: 98% 97% 100% 96%  Weight:   80.4 kg (177 lb 3.2 oz)   Height:   5\' 7"  (1.702 m)     Intake/Output Summary (Last 24 hours) at 01/07/2018 1000 Last data filed at 01/07/2018 0427 Gross per 24 hour  Intake 2165 ml  Output 900 ml  Net 1265 ml   Filed Weights   01/06/18 1845 01/06/18 2313  Weight: 78.5 kg (173 lb) 80.4 kg (177 lb 3.2 oz)    Examination:  General exam: Appears calm and comfortable  Respiratory system: Clear to auscultation. Respiratory effort normal. Cardiovascular system: S1 & S2 heard, RRR. No JVD, murmurs, rubs, gallops or clicks. No pedal edema. Gastrointestinal system: Abdomen is nondistended, soft and nontender. No organomegaly or masses felt. Normal bowel sounds heard. Central nervous system: Alert and  oriented. No focal neurological deficits. Extremities: Symmetric 5 x 5 power. Skin: No rashes, lesions or ulcers Psychiatry: Judgement and insight appear normal. Mood & affect appropriate.     Data Reviewed: I have personally reviewed following labs and imaging studies  CBC: Recent Labs  Lab 01/06/18 1925  WBC 7.7  HGB 14.2  HCT 42.7  MCV 89.9  PLT 196   Basic Metabolic Panel: Recent Labs    Lab 01/06/18 1925  NA 131*  K 4.7  CL 93*  CO2 29  GLUCOSE 530*  BUN 12  CREATININE 1.04*  CALCIUM 9.3  MG 2.0  PHOS 3.9   GFR: Estimated Creatinine Clearance: 72.9 mL/min (A) (by C-G formula based on SCr of 1.04 mg/dL (H)). Liver Function Tests: Recent Labs  Lab 01/06/18 1925  AST 17  ALT 19  ALKPHOS 144*  BILITOT 0.4  PROT 6.9  ALBUMIN 3.9   Recent Labs  Lab 01/06/18 1925  LIPASE 22   No results for input(s): AMMONIA in the last 168 hours. Coagulation Profile: No results for input(s): INR, PROTIME in the last 168 hours. Cardiac Enzymes: No results for input(s): CKTOTAL, CKMB, CKMBINDEX, TROPONINI in the last 168 hours. BNP (last 3 results) No results for input(s): PROBNP in the last 8760 hours. HbA1C: No results for input(s): HGBA1C in the last 72 hours. CBG: Recent Labs  Lab 01/06/18 2351 01/07/18 0024 01/07/18 0206 01/07/18 0427 01/07/18 0729  GLUCAP 56* 84 108* 114* 142*   Lipid Profile: No results for input(s): CHOL, HDL, LDLCALC, TRIG, CHOLHDL, LDLDIRECT in the last 72 hours. Thyroid Function Tests: No results for input(s): TSH, T4TOTAL, FREET4, T3FREE, THYROIDAB in the last 72 hours. Anemia Panel: No results for input(s): VITAMINB12, FOLATE, FERRITIN, TIBC, IRON, RETICCTPCT in the last 72 hours. Sepsis Labs: No results for input(s): PROCALCITON, LATICACIDVEN in the last 168 hours.  No results found for this or any previous visit (from the past 240 hour(s)).     Radiology Studies: Ct Abdomen Pelvis W Contrast  Result Date: 01/06/2018 CLINICAL DATA:  Lower abdominal pain and nausea. Abdominal distention. No bowel movements for the past 7 weeks. EXAM: CT ABDOMEN AND PELVIS WITH CONTRAST TECHNIQUE: Multidetector CT imaging of the abdomen and pelvis was performed using the standard protocol following bolus administration of intravenous contrast. CONTRAST:  129mL ISOVUE-300 IOPAMIDOL (ISOVUE-300) INJECTION 61% COMPARISON:  12/09/2017. FINDINGS:  Lower chest: Minimal bilateral dependent atelectasis. Hepatobiliary: No focal liver abnormality is seen. Status post cholecystectomy. No biliary dilatation. Pancreas: Diffuse pancreatic atrophy. Spleen: Normal in size without focal abnormality. Adrenals/Urinary Tract: Adrenal glands are unremarkable. Kidneys are normal, without renal calculi, focal lesion, or hydronephrosis. Bladder is unremarkable. Stomach/Bowel: Prominent stool throughout the colon. Surgically absent appendix. Unremarkable stomach and small bowel. Vascular/Lymphatic: No significant vascular findings are present. No enlarged abdominal or pelvic lymph nodes. Reproductive: Status post hysterectomy. No adnexal masses. Other: No abdominal wall hernia or abnormality. No abdominopelvic ascites. Musculoskeletal: Unremarkable bones. IMPRESSION: No acute abnormality.  Prominent stool throughout the colon. Electronically Signed   By: Claudie Revering M.D.   On: 01/06/2018 21:33        Scheduled Meds: . buPROPion  150 mg Oral Daily  . docusate sodium  100 mg Oral BID  . estradiol  1 mg Oral Daily  . heparin  5,000 Units Subcutaneous Q8H  . insulin aspart  0-9 Units Subcutaneous Q4H  . insulin glargine  24 Units Subcutaneous QAC breakfast  . pantoprazole  40 mg Oral Daily  . pravastatin  10 mg Oral QHS  . pregabalin  150 mg Oral BID   Continuous Infusions: . dextrose 5 % and 0.45% NaCl 125 mL/hr at 01/07/18 0810     LOS: 0 days    Time spent: 30 minutes    Delorse Shane Darleen Crocker, DO [facility] Pager [phone]  If 7PM-7AM, please contact night-coverage [email or web address] Password [email or web address] 01/07/2018, 10:00 AM

## 2018-01-07 NOTE — Progress Notes (Signed)
Patients BS 56 at 0000, half of ampule of dextrose given. BS up to 84 within 30 mins. Notified Dr. Olevia Bowens to make aware, new orders to re-check BS in 30 mins., give other half of dextrose if blood sugar is below 100.

## 2018-01-07 NOTE — Progress Notes (Signed)
Hypoglycemic Event  CBG: 63  Treatment: Pt ate dinner tray  Symptoms: hunger  Follow-up CBG: Time:1715 CBG Result:172  Possible Reasons for Event: Patient did not eat her lunch tray  Comments/MD notified: Manuella Ghazi; no further orders    Farren Landa N Yomara Toothman

## 2018-01-07 NOTE — Progress Notes (Signed)
BS 43, administered half of dextrose as previously ordered. Notified Dr. Olevia Bowens to make aware. New order for dextrose at 130ml/hr in addition to 1/2 NS. BS 108 within the hr. Will continue to monitor.

## 2018-01-07 NOTE — Consult Note (Signed)
Referring Provider: Heath Lark, DO Primary Care Physician:  Karsten Ro, DO Primary Gastroenterologist:  Dr. Laural Golden  Reason for Consultation:    Constipation.  HPI:   Patient is a 47 year old Caucasian female who has history of chronic constipation and has been doing well with dietary measures and a stool softener.  She states she has been on stool softener for 3 years or so.  She states she has not had a normal bowel movement in the last 7 weeks.  She has had liquid stools when she is taken various preparations.  She was seen in the office by Ms. Setzer NP and given samples of Linzess but she did not see any benefit and stopped it after 5 days.  She has taken GoLYTELY 1 gallon on 2 different days without a bowel movement but she did pass liquid stool.  She also has tried mag citrate on 4 different occasions as well as Fleet and milk of molasses enemas.  She also complains of cramping across upper and lower abdomen.  She denies melena or rectal bleeding.  She says her appetite has been up and down on some days she can eat well and other days she cannot.  However she has gained 15 pounds in the last 2 to 3 months.  She has had nausea but no vomiting. She has been seen in emergency room on at least 3 different occasions.  On her first visit 4 weeks ago she had abdominopelvic CT which revealed moderate stool burden but no evidence of dilated colon or small bowel.  She returned 3 days later and had an MI with water-soluble contrast and revealing stool throughout the colon but mainly in transverse and ascending colon.  Patient states she also has tried lactulose but it has not helped. Patient reported to emergency room yesterday because her glucose level was over 600.  She was also extremely nauseated and complaining of abdominal pain.  Her glucose was over 500.  She was felt to be dehydrated.  She was felt to have nonketotic hyperosmolar state.  She had another abdominopelvic CT revealing large amount  of stool in her colon but once again colon was not dilated. Patient states she was begun on Wellbutrin short time before her constipation got worse.  She feels her depression is not well controlled.  She is trying to eat fiber rich foods but it has not helped.  She says heartburn is well controlled with therapy.  She has a history of peripheral neuropathy and at times has had gait issues but lately she has not fallen.  She does try to walk couple times a week and she also rides stationary bike at least 3 times a week. Her last colonoscopy was in November 2013 by Dr. Truddie Hidden and was normal.  She does not take OTC NSAIDs. She was diagnosed with diabetes mellitus when she was 47 years old.  She states last A1c was 10.  It has been as low as 6.  She gets yearly eye exams and has not had any problems.  Last exam was 2 months ago. She says she had normal TSH couple of months ago. She has 1 son from her first marriage who is in good health at age 21.  She is not engaged in her [place] is at bedside. 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 She smokes about half a pack per day.  She started smoking when she was 47 years  old.  She drinks alcohol occasionally.  Family history significant for CRC in her sister who was 65 at the time of diagnosis and died a year later of metastatic disease.  Her father was diagnosed with Crohn's disease over 75 years ago and is doing well at age 42.  Past Medical History:  Diagnosis Date  .  04/11/13  .  02/18/14  . Anxiety   . Benign paroxysmal vertigo 09/04/13  .  02/28/14  . Bunion of great toe 04/11/13  .  06/13/14  . Depression   .  11/08/2017  .  Insulin-dependent diabetes ([place]) diagnosed at age 89..   .  05/17/14  .  03/27/14  .  01/28/14  .  01/28/14  . GERD (gastroesophageal reflux disease)   . Hiatal hernia   . Hyperlipidemia       . IBS (irritable bowel syndrome)   . Migraine headache   .  Peripheral neuropathy.   .   06/13/14  .  03/15/14  .    .  11/05/13  .    .  03/07/13  .    .    .      Past Surgical History:  Procedure Laterality Date  . ABDOMINAL HYSTERECTOMY    . APPENDECTOMY    . CARDIAC CATHETERIZATION   last in 2009   x 4, normal coronary arteries  . CARDIAC CATHETERIZATION N/A 03/26/2015   Procedure: Left Heart Cath and Coronary Angiography;  Surgeon: Wellington Hampshire, MD;  Location: [facility];  Service: Cardiovascular;  Laterality: N/A;  . CESAREAN SECTION    . CHOLECYSTECTOMY    . CYST EXCISION     right breast  . TUBAL LIGATION      Prior to Admission medications   Medication Sig Start Date End Date Taking? Authorizing Provider  buPROPion (ZYBAN) 150 MG 12 hr tablet Take 150 mg by mouth daily.   Yes [provider]  cyclobenzaprine (FLEXERIL) 10 MG tablet Take 10 mg by mouth as needed.  01/12/17  Yes [provider]  diazepam (VALIUM) 10 MG tablet Take 1 tablet by mouth 4 (four) times daily - after meals and at bedtime. 12/27/17  Yes [provider]  docusate sodium (COLACE) 100 MG capsule Take 100 mg by mouth 2 (two) times daily.   Yes [provider]  EPIPEN 2-PAK 0.3 MG/0.3ML SOAJ injection Inject 0.3 mg into the muscle once.  02/13/16  Yes [provider]  Insulin Glargine (LANTUS SOLOSTAR) 100 UNIT/ML Solostar Pen Inject 24 Units into the skin daily before breakfast. 04/08/15  Yes Philemon Kingdom, MD  insulin lispro (HUMALOG KWIKPEN) 100 UNIT/ML KiwkPen Inject 10 Units into the skin 3 (three) times daily with meals. + SLIDING SCALE   Yes [provider]  lisinopril (PRINIVIL,ZESTRIL) 5 MG tablet Take 5 mg by mouth daily. 06/10/15  Yes [provider]  LYRICA 150 MG capsule Take 150 mg by mouth 2 (two) times daily.  08/19/15  Yes [provider]  Melatonin 10 MG CAPS Take 10 mg by mouth at bedtime.    Yes [provider]  Omega-3 Fatty Acids (FISH OIL) 1200 MG CPDR Take 1 capsule by mouth daily.     Yes [provider]  omeprazole (PRILOSEC) 40 MG capsule Take 40 mg by mouth as needed.   Yes [provider]  pravastatin (PRAVACHOL) 10 MG tablet Take 10 mg by mouth at bedtime. 05/29/15  Yes [provider]  estradiol (ESTRACE)  1 MG tablet Take 1 mg by mouth daily.    [provider]  polyethylene glycol (MIRALAX / GLYCOLAX) packet Take 17 g by mouth daily. 12/10/17   Ripley Fraise, MD  polyethylene glycol-electrolytes (NULYTELY/GOLYTELY) 420 g solution Drink one 8 ounce glass every hour until you have a bowel movement 12/09/17   Milton Ferguson, MD    Current Facility-Administered Medications  Medication Dose Route Frequency Provider Last Rate Last Dose  . acetaminophen (TYLENOL) tablet 650 mg  650 mg Oral Q6H PRN Reubin Milan, MD   650 mg at 01/07/18 1216   Or  . acetaminophen (TYLENOL) suppository 650 mg  650 mg Rectal Q6H PRN Reubin Milan, MD      . buPROPion [facility]) 12 hr tablet 150 mg  150 mg Oral Daily Reubin Milan, MD   150 mg at 01/07/18 0935  . diazepam (VALIUM) tablet 10 mg  10 mg Oral Q6H PRN Reubin Milan, MD   10 mg at 01/07/18 1428  . docusate sodium (COLACE) capsule 100 mg  100 mg Oral BID Reubin Milan, MD   100 mg at 01/07/18 0935  . estradiol (ESTRACE) tablet 1 mg  1 mg Oral Daily Reubin Milan, MD   1 mg at 01/07/18 0935  . heparin injection 5,000 Units  5,000 Units Subcutaneous Q8H Reubin Milan, MD   5,000 Units at 01/07/18 1428  . HYDROcodone-acetaminophen (NORCO/VICODIN) 5-325 MG per tablet 1 tablet  1 tablet Oral Q4H PRN Rogene Houston, MD   1 tablet at 01/07/18 1428  . insulin aspart (novoLOG) injection 0-9 Units  0-9 Units Subcutaneous Q4H Shah, Pratik D, DO   1 Units at 01/07/18 1306  . insulin glargine (LANTUS) injection 24 Units  24 Units Subcutaneous QAC breakfast Reubin Milan, MD   24 Units at 01/07/18 [phone]  . milk and molasses enema  1 enema Rectal Daily PRN  Reubin Milan, MD      . nicotine (NICODERM CQ - dosed in mg/24 hours) patch 14 mg  14 mg Transdermal Daily PRN Reubin Milan, MD      . ondansetron [facility]) tablet 4 mg  4 mg Oral Q6H PRN Reubin Milan, MD       Or  . ondansetron [facility]) injection 4 mg  4 mg Intravenous Q6H PRN Reubin Milan, MD   4 mg at 01/07/18 0026  . pantoprazole (PROTONIX) EC tablet 40 mg  40 mg Oral Daily Reubin Milan, MD   40 mg at 01/07/18 0934  . pravastatin (PRAVACHOL) tablet 10 mg  10 mg Oral QHS Reubin Milan, MD   10 mg at 01/07/18 0026  . pregabalin (LYRICA) capsule 150 mg  150 mg Oral BID Reubin Milan, MD   150 mg at 01/07/18 1962    Allergies as of 01/06/2018 - Review Complete 01/06/2018  Allergen Reaction Noted  . Zocor [simvastatin] Other (See Comments) 03/25/2015  . Clindamycin/lincomycin Rash 10/07/2012  . Codeine Rash 10/07/2012  . Penicillins Rash 10/07/2012  . Sulfa antibiotics Rash 10/07/2012    Family History  Problem Relation Age of Onset  . Stroke Mother 23  . Heart attack Mother 43  . Cancer Sister        colon  . Heart failure Maternal Grandmother 65  . Cancer Maternal Grandfather        prostate  . Heart failure Paternal Grandmother 72  . Heart failure Paternal Grandfather 54  Social History   Socioeconomic History  . Marital status: Divorced    Spouse name: Not on file  . Number of children: Not on file  . Years of education: Not on file  . Highest education level: Not on file  Occupational History  . Not on file  Social Needs  . Financial resource strain: Not on file  . Food insecurity:    Worry: Not on file    Inability: Not on file  . Transportation needs:    Medical: Not on file    Non-medical: Not on file  Tobacco Use  . Smoking status: Current Every Day Smoker    Packs/day: 0.50    Years: 11.00    Pack years: 5.50    Types: Cigarettes  . Smokeless tobacco: Never Used  . Tobacco comment: patient is aware that  she needs to quit smoking  Substance and Sexual Activity  . Alcohol use: Yes    Alcohol/week: 0.0 oz    Comment: occasionally  . Drug use: No  . Sexual activity: Never  Lifestyle  . Physical activity:    Days per week: Not on file    Minutes per session: Not on file  . Stress: Not on file  Relationships  . Social connections:    Talks on phone: Not on file    Gets together: Not on file    Attends religious service: Not on file    Active member of club or organization: Not on file    Attends meetings of clubs or organizations: Not on file    Relationship status: Not on file  . Intimate partner violence:    Fear of current or ex partner: Not on file    Emotionally abused: Not on file    Physically abused: Not on file    Forced sexual activity: Not on file  Other Topics Concern  . Not on file  Social History Narrative  . Not on file    Review of Systems: See HPI, otherwise normal ROS  Physical Exam: Temp:  [97.4 F (36.3 C)-97.9 F (36.6 C)] 97.6 F (36.4 C) (06/08 1402) Pulse Rate:  [69-84] 72 (06/08 1402) Resp:  [16-20] 16 (06/08 1402) BP: (96-117)/(61-78) 101/61 (06/08 1402) SpO2:  [96 %-100 %] 97 % (06/08 1402) Weight:  [173 lb (78.5 kg)-177 lb 3.2 oz (80.4 kg)] 177 lb 3.2 oz (80.4 kg) (06/07 2313)   Patient is alert and in no acute distress. Conjunctiva is pink and sclerae nonicteric. Oropharyngeal mucosa is dry otherwise normal. No neck masses or thyromegaly noted. Cardiac exam with regular rhythm normal S1 and S2.  No murmur gallop noted. Lungs are clear to auscultation. Abdomen is symmetrical.  Bowel sounds are normal.  On palpation she has mild generalized tenderness which is more pronounced in mid epigastric region.  No guarding or rebound.  No organomegaly or masses. She does not have peripheral edema or clubbing.   Lab Results: Recent Labs    01/06/18 1925  WBC 7.7  HGB 14.2  HCT 42.7  PLT 207   BMET Recent Labs    01/06/18 1925  NA 131*   K 4.7  CL 93*  CO2 29  GLUCOSE 530*  BUN 12  CREATININE 1.04*  CALCIUM 9.3   LFT Recent Labs    01/06/18 1925  PROT 6.9  ALBUMIN 3.9  AST 17  ALT 19  ALKPHOS 144*  BILITOT 0.4  BILIDIR <0.1*  IBILI NOT CALCULATED    Studies/Results: Ct Abdomen Pelvis W  Contrast  Result Date: 01/06/2018 CLINICAL DATA:  Lower abdominal pain and nausea. Abdominal distention. No bowel movements for the past 7 weeks. EXAM: CT ABDOMEN AND PELVIS WITH CONTRAST TECHNIQUE: Multidetector CT imaging of the abdomen and pelvis was performed using the standard protocol following bolus administration of intravenous contrast. CONTRAST:  138mL ISOVUE-300 IOPAMIDOL (ISOVUE-300) INJECTION 61% COMPARISON:  12/09/2017. FINDINGS: Lower chest: Minimal bilateral dependent atelectasis. Hepatobiliary: No focal liver abnormality is seen. Status post cholecystectomy. No biliary dilatation. Pancreas: Diffuse pancreatic atrophy. Spleen: Normal in size without focal abnormality. Adrenals/Urinary Tract: Adrenal glands are unremarkable. Kidneys are normal, without renal calculi, focal lesion, or hydronephrosis. Bladder is unremarkable. Stomach/Bowel: Prominent stool throughout the colon. Surgically absent appendix. Unremarkable stomach and small bowel. Vascular/Lymphatic: No significant vascular findings are present. No enlarged abdominal or pelvic lymph nodes. Reproductive: Status post hysterectomy. No adnexal masses. Other: No abdominal wall hernia or abnormality. No abdominopelvic ascites. Musculoskeletal: Unremarkable bones. IMPRESSION: No acute abnormality.  Prominent stool throughout the colon. Electronically Signed   By: Claudie Revering M.D.   On: 01/06/2018 21:33   I have reviewed the following studies; Gastrografin enema from 01-02-2018 and abdominopelvic CT from 5 05/30/2017 and 01/06/2018  Assessment;  #1. Acute on chronic constipation.  Patient has not had a normal bowel movement in 7 weeks but she has been passing liquid  stool.  She clearly is not obstructed.  Gastrografin study few weeks ago did not show megacolon or colonic stricture. Her constipation appears to be multifactorial.  Suspect addition of Wellbutrin few weeks ago may have tipped the balance.  Diazepam and Lyrica may also be playing a role.  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  I doubt colonic stricture.  #2. Epigastric pain and nausea.  She is on PPI.  Therefore peptic ulcer disease less likely.  The symptoms may also be related to her chronic constipation.  #3. Patient is high risk for colorectal carcinoma.  Last colonoscopy was normal in 2013.  Recommendations;  Hydrocodone/acetaminophen 3/325 1 tablet p.o. every 6 as needed abdominal pain.  Patient to use this medication judiciously as it could make her constipation worse. Amitiza 24 mcg p.o. twice daily.  First dose tonight. Diagnostic esophagogastroduodenoscopy and colonoscopy on 01/09/2018 under monitored anesthesia care. I would also recommend that she should come off bupropion.   LOS: 0 days   Kaius Daino  01/07/2018, 3:02 PM

## 2018-01-08 DIAGNOSIS — K228 Other specified diseases of esophagus: Secondary | ICD-10-CM | POA: Diagnosis not present

## 2018-01-08 DIAGNOSIS — E1065 Type 1 diabetes mellitus with hyperglycemia: Secondary | ICD-10-CM | POA: Diagnosis not present

## 2018-01-08 DIAGNOSIS — F419 Anxiety disorder, unspecified: Secondary | ICD-10-CM | POA: Diagnosis not present

## 2018-01-08 DIAGNOSIS — K5900 Constipation, unspecified: Secondary | ICD-10-CM | POA: Diagnosis not present

## 2018-01-08 DIAGNOSIS — R1013 Epigastric pain: Secondary | ICD-10-CM | POA: Diagnosis not present

## 2018-01-08 DIAGNOSIS — R110 Nausea: Secondary | ICD-10-CM | POA: Diagnosis not present

## 2018-01-08 DIAGNOSIS — Z800 Family history of malignant neoplasm of digestive organs: Secondary | ICD-10-CM | POA: Diagnosis not present

## 2018-01-08 LAB — COMPREHENSIVE METABOLIC PANEL
ALBUMIN: 3.4 g/dL — AB (ref 3.5–5.0)
ALK PHOS: 96 U/L (ref 38–126)
ALT: 15 U/L (ref 14–54)
AST: 22 U/L (ref 15–41)
Anion gap: 8 (ref 5–15)
BILIRUBIN TOTAL: 0.3 mg/dL (ref 0.3–1.2)
BUN: 9 mg/dL (ref 6–20)
CO2: 27 mmol/L (ref 22–32)
CREATININE: 0.66 mg/dL (ref 0.44–1.00)
Calcium: 9.1 mg/dL (ref 8.9–10.3)
Chloride: 103 mmol/L (ref 101–111)
GFR calc Af Amer: >60 mL/min (ref >60)
GLUCOSE: 211 mg/dL — AB (ref 65–99)
POTASSIUM: 3.6 mmol/L (ref 3.5–5.1)
Sodium: 138 mmol/L (ref 135–145)
TOTAL PROTEIN: 6 g/dL — AB (ref 6.5–8.1)

## 2018-01-08 LAB — GLUCOSE, CAPILLARY
GLUCOSE-CAPILLARY: 321 mg/dL — AB (ref 65–99)
GLUCOSE-CAPILLARY: 211 mg/dL — AB (ref 65–99)
GLUCOSE-CAPILLARY: 99 mg/dL (ref 65–99)
GLUCOSE-CAPILLARY: 317 mg/dL — AB (ref 65–99)
Glucose-Capillary: 203 mg/dL — ABNORMAL HIGH (ref 65–99)
Glucose-Capillary: 374 mg/dL — ABNORMAL HIGH (ref 65–99)

## 2018-01-08 LAB — CBC
HEMATOCRIT: 39.3 % (ref 36.0–46.0)
Hemoglobin: 12.9 g/dL (ref 12.0–15.0)
MCH: 29.6 pg (ref 26.0–34.0)
MCHC: 32.8 g/dL (ref 30.0–36.0)
MCV: 90.1 fL (ref 78.0–100.0)
Platelets: 192 10*3/uL (ref 150–400)
RBC: 4.36 MIL/uL (ref 3.87–5.11)
RDW: 12.7 % (ref 11.5–15.5)
WBC: 6 10*3/uL (ref 4.0–10.5)

## 2018-01-08 LAB — HEMOGLOBIN A1C
Hgb A1c MFr Bld: 10.8 % — ABNORMAL HIGH (ref 4.8–5.6)
MEAN PLASMA GLUCOSE: 263.26 mg/dL

## 2018-01-08 MED ORDER — INSULIN ASPART 100 UNIT/ML ~~LOC~~ SOLN
0.0000 [IU] | Freq: Three times a day (TID) | SUBCUTANEOUS | Status: DC
  Administered 2018-01-08: 3 [IU] via SUBCUTANEOUS
  Administered 2018-01-09 (×2): 2 [IU] via SUBCUTANEOUS

## 2018-01-08 MED ORDER — FLEET ENEMA 7-19 GM/118ML RE ENEM
1.0000 | ENEMA | Freq: Once | RECTAL | Status: AC
Start: 2018-01-08 — End: 2018-01-08
  Administered 2018-01-08: 1 via RECTAL

## 2018-01-08 MED ORDER — BISACODYL 5 MG PO TBEC
10.0000 mg | DELAYED_RELEASE_TABLET | Freq: Once | ORAL | Status: AC
  Administered 2018-01-08: 10 mg via ORAL
  Filled 2018-01-08: qty 2

## 2018-01-08 MED ORDER — PEG 3350-KCL-NA BICARB-NACL 420 G PO SOLR
4000.0000 mL | Freq: Once | ORAL | Status: AC
  Administered 2018-01-08: 4000 mL via ORAL
  Filled 2018-01-08: qty 4000

## 2018-01-08 MED ORDER — INSULIN ASPART 100 UNIT/ML ~~LOC~~ SOLN: 0.0000 [IU] | Freq: Three times a day (TID) | SUBCUTANEOUS | Status: DC

## 2018-01-08 MED ORDER — INSULIN ASPART 100 UNIT/ML ~~LOC~~ SOLN
4.0000 [IU] | Freq: Three times a day (TID) | SUBCUTANEOUS | Status: DC
  Administered 2018-01-08: 4 [IU] via SUBCUTANEOUS

## 2018-01-08 MED ORDER — SODIUM CHLORIDE 0.9 % IV SOLN: INTRAVENOUS | Status: DC

## 2018-01-08 MED ORDER — INSULIN ASPART 100 UNIT/ML ~~LOC~~ SOLN
0.0000 [IU] | Freq: Every day | SUBCUTANEOUS | Status: DC
  Administered 2018-01-08: 4 [IU] via SUBCUTANEOUS

## 2018-01-08 MED ORDER — MAGNESIUM CITRATE PO SOLN
1.0000 | Freq: Once | ORAL | Status: AC
  Administered 2018-01-08: 1 via ORAL
  Filled 2018-01-08: qty 296

## 2018-01-08 MED ORDER — INSULIN ASPART 100 UNIT/ML ~~LOC~~ SOLN: 0.0000 [IU] | Freq: Every day | SUBCUTANEOUS | Status: DC

## 2018-01-08 NOTE — Progress Notes (Signed)
Inpatient Diabetes Program Recommendations  AACE/ADA: New Consensus Statement on Inpatient Glycemic Control (2015)  Target Ranges:  Prepandial:   less than 140 mg/dL      Peak postprandial:   less than 180 mg/dL (1-2 hours)      Critically ill patients:  140 - 180 mg/dL   Results for AIESHA, LELAND (MRN [id number]) as of 01/08/2018 09:31  Ref. Range 01/06/2018 18:47 01/06/2018 22:24 01/06/2018 22:38 01/06/2018 23:51 01/07/2018 00:24  Glucose-Capillary Latest Ref Range: 65 - 99 mg/dL 509 (HH)  10 units NOVOLOG at 8pm 237 (H) 195 (H) 56 (L) 84   Results for SHAMARA, SOZA (MRN [id number]) as of 01/08/2018 09:31  Ref. Range 01/07/2018 02:06 01/07/2018 04:27 01/07/2018 07:29 01/07/2018 11:04 01/07/2018 16:35 01/07/2018 17:15 01/07/2018 21:55 01/07/2018 23:05  Glucose-Capillary Latest Ref Range: 65 - 99 mg/dL 108 (H) 114 (H) 142 (H)  2 units NOVOLOG +  24 units LANTUS  123 (H)  1 unit NOVOLOG  63 (L) 172 (H) 508 (HH)  9 units NOVOLOG  498 (H)   Results for KOBY, HARTFIELD (MRN [id number]) as of 01/08/2018 09:31  Ref. Range 01/08/2018 00:47 01/08/2018 03:17 01/08/2018 07:44  Glucose-Capillary Latest Ref Range: 65 - 99 mg/dL 374 (H)  5 units NOVOLOG  321 (H)  7 units NOVOLOG  203 (H)  3 units NOVOLOG +  24 units LANTUS     Admit: Elevated blood glucose, abdominal pain and nausea  History: DM Type 1 (since age 28)  Home DM Meds: Lantus 24 units daily        Humalog 10 units TID        Humalog SSI   Current Orders: Lantus 24 units daily      Novolog Sensitive Correction Scale/ SSI (0-9 units) Q4 hours     History of Type 1 DM.  Makes no endogenous insulin so is likely very Sensitive to Insulin and also needs Insulin whenever she eats a meal.  Note patient skipped lunch yesterday at 12pm.  Was only given 1 unit of Novolog for correction and dropped to 63 mg/dl by 4pm.  Patient then ate dinner and CBG spiked to 508 mg/dl at bedtime because patient didn't get any Novolog coverage for the food  she ate at dinner.  To prevent erratic CBGs, we need to consider adding Novolog Meal Coverage for when patient eats >50% of her meal.    MD- Please consider the following adjustments:  1. Start Novolog Meal Coverage: Novolog 4 units TID with meals (Only give 2 units if patient eats <50% of meal, Hold if patient eats nothing)  2. Change Novolog SSi to a more sensitive scale TID AC:  <150 mg/dl- 0 units 150-200 mg/dl- 1 unit 201-250 mg/dl- 2 units 251-300 mg/dl- 3 units 301-350 mg/dl- 4 units 351-400 mg/dl- 5 units >400 mg/dl- 5 units and call MD    --Will follow patient during hospitalization--  Wyn Quaker RN, MSN, CDE Diabetes Coordinator Inpatient Glycemic Control Team Team Pager: [phone] (8a-5p)

## 2018-01-08 NOTE — Progress Notes (Signed)
Received verbal order from Dr. Laural Golden for the patient to get 2 tablets of dulcolax Now and 2 more tablets of Dulcolax at 19:45 PM.

## 2018-01-08 NOTE — Progress Notes (Signed)
PROGRESS NOTE    [name]  IDP:[id number] DOB: 16-Jul-1971 DOA: 01/06/2018 PCP: Karsten Ro, DO   Brief Narrative:   [name] is a 47 y.o. female with medical history significant of abdominal wall mass of right flank, benign paroxysmal vertigo, chronic neck pain, depression with anxiety, type 1 diabetes since age 4, chronic back pain, GERD, hiatal hernia, hyperlipidemia, hypertension, IBS, migraine headaches, postherpetic headache, diabetic peripheral neuropathy, history of TIA, tobacco use who is coming to the emergency after her CBG read her showed greater than 600 at home.    She was also noted to have severe constipation with no bowel movement in the last 7 weeks and has also had some abdominal pain with nausea and vomiting over the last 2 days.  CT of her abdomen and pelvis has demonstrated a high stool burden.   Assessment & Plan:   Principal Problem:   Uncontrolled diabetes mellitus ([place]) Active Problems:   Tobacco abuse   Hypertension   Hyperlipidemia   Anxiety   Gastro-esophageal reflux disease without esophagitis   Family hx of colon cancer   Depression   Peripheral neuropathy   1. Severe hyperglycemia secondary to uncontrolled type 1 diabetes-labile.  This appears to be related to dietary noncompliance as patient is noted to be eating candy.  Appreciate diabetes coordinator recommendations and will also consult nutrition for counseling.  Maintain on sensitive sliding scale with mealtime coverage and current Lantus dosing. 2. Severe constipation likely neurogenic due to poorly controlled diabetes.  Further evaluation per GI with plans for EGD and colonoscopy in a.m. and bowel prep today. 3. Hypertension.  Currently soft blood pressure readings.  Hold ACE inhibitor for now. 4. AKI secondary to dehydration.  Currently resolved and back to baseline, will continue to monitor. 5. Dyslipidemia.  Continue pravastatin. 6. Anxiety/depression.  Continue diazepam  and will discontinue bupropion as this appears to be another causative agent and patient's constipation issues. 7. GERD.  Continue PPI. 8. Peripheral neuropathy.  Continue Lyrica twice a day. 9. Tobacco abuse.  Nicotine replacement and cessation counseling.   DVT prophylaxis:Heparin  Code Status: Full Family Communication: Fiancee at bedside Disposition Plan: GI evaluation for stool impaction   Consultants:   GI Dr. Laural Golden  Procedures:   None  Antimicrobials:   None   Subjective: Patient seen and evaluated today with no new acute complaints or concerns.  She has not had a bowel movement and is going to start colon prep today in anticipation for EGD and colonoscopy tomorrow.  Her blood glucose levels remain labile and it is noted that she is noncompliant with her diet and is eating Skittles and trail mix on occasion.  Objective: Vitals:   01/07/18 0540 01/07/18 1402 01/07/18 2152 01/08/18 0644  BP: 96/64 101/61 120/72 (!) 94/58  Pulse: 69 72 74 76  Resp: 18 16 16 18   Temp: 97.6 F (36.4 C) 97.6 F (36.4 C) 98.2 F (36.8 C) 97.6 F (36.4 C)  TempSrc: Oral Oral Oral Oral  SpO2: 96% 97% 96% 95%  Weight:      Height:        Intake/Output Summary (Last 24 hours) at 01/08/2018 0951 Last data filed at 01/07/2018 1800 Gross per 24 hour  Intake 480 ml  Output -  Net 480 ml   Filed Weights   01/06/18 1845 01/06/18 2313  Weight: 78.5 kg (173 lb) 80.4 kg (177 lb 3.2 oz)    Examination:  General exam: Appears calm and comfortable  Respiratory system: Clear to auscultation. Respiratory effort normal. Cardiovascular system: S1 & S2 heard, RRR. No JVD, murmurs, rubs, gallops or clicks. No pedal edema. Gastrointestinal system: Abdomen is nondistended, soft and nontender. No organomegaly or masses felt. Normal bowel sounds heard. Central nervous system: Alert and oriented. No focal neurological deficits. Extremities: Symmetric 5 x 5 power. Skin: No rashes, lesions or  ulcers Psychiatry: Judgement and insight appear normal. Mood & affect appropriate.     Data Reviewed: I have personally reviewed following labs and imaging studies  CBC: Recent Labs  Lab 01/06/18 1925 01/08/18 0623  WBC 7.7 6.0  HGB 14.2 12.9  HCT 42.7 39.3  MCV 89.9 90.1  PLT 207 355   Basic Metabolic Panel: Recent Labs  Lab 01/06/18 1925 01/08/18 0623  NA 131* 138  K 4.7 3.6  CL 93* 103  CO2 29 27  GLUCOSE 530* 211*  BUN 12 9  CREATININE 1.04* 0.66  CALCIUM 9.3 9.1  MG 2.0  --   PHOS 3.9  --    GFR: Estimated Creatinine Clearance: 94.8 mL/min (by C-G formula based on SCr of 0.66 mg/dL). Liver Function Tests: Recent Labs  Lab 01/06/18 1925 01/08/18 0623  AST 17 22  ALT 19 15  ALKPHOS 144* 96  BILITOT 0.4 0.3  PROT 6.9 6.0*  ALBUMIN 3.9 3.4*   Recent Labs  Lab 01/06/18 1925  LIPASE 22   No results for input(s): AMMONIA in the last 168 hours. Coagulation Profile: No results for input(s): INR, PROTIME in the last 168 hours. Cardiac Enzymes: No results for input(s): CKTOTAL, CKMB, CKMBINDEX, TROPONINI in the last 168 hours. BNP (last 3 results) No results for input(s): PROBNP in the last 8760 hours. HbA1C: No results for input(s): HGBA1C in the last 72 hours. CBG: Recent Labs  Lab 01/07/18 2155 01/07/18 2305 01/08/18 0047 01/08/18 0317 01/08/18 0744  GLUCAP 508* 498* 374* 321* 203*   Lipid Profile: No results for input(s): CHOL, HDL, LDLCALC, TRIG, CHOLHDL, LDLDIRECT in the last 72 hours. Thyroid Function Tests: No results for input(s): TSH, T4TOTAL, FREET4, T3FREE, THYROIDAB in the last 72 hours. Anemia Panel: No results for input(s): VITAMINB12, FOLATE, FERRITIN, TIBC, IRON, RETICCTPCT in the last 72 hours. Sepsis Labs: No results for input(s): PROCALCITON, LATICACIDVEN in the last 168 hours.  No results found for this or any previous visit (from the past 240 hour(s)).     Radiology Studies: Ct Abdomen Pelvis W Contrast  Result  Date: 01/06/2018 CLINICAL DATA:  Lower abdominal pain and nausea. Abdominal distention. No bowel movements for the past 7 weeks. EXAM: CT ABDOMEN AND PELVIS WITH CONTRAST TECHNIQUE: Multidetector CT imaging of the abdomen and pelvis was performed using the standard protocol following bolus administration of intravenous contrast. CONTRAST:  12mL ISOVUE-300 IOPAMIDOL (ISOVUE-300) INJECTION 61% COMPARISON:  12/09/2017. FINDINGS: Lower chest: Minimal bilateral dependent atelectasis. Hepatobiliary: No focal liver abnormality is seen. Status post cholecystectomy. No biliary dilatation. Pancreas: Diffuse pancreatic atrophy. Spleen: Normal in size without focal abnormality. Adrenals/Urinary Tract: Adrenal glands are unremarkable. Kidneys are normal, without renal calculi, focal lesion, or hydronephrosis. Bladder is unremarkable. Stomach/Bowel: Prominent stool throughout the colon. Surgically absent appendix. Unremarkable stomach and small bowel. Vascular/Lymphatic: No significant vascular findings are present. No enlarged abdominal or pelvic lymph nodes. Reproductive: Status post hysterectomy. No adnexal masses. Other: No abdominal wall hernia or abnormality. No abdominopelvic ascites. Musculoskeletal: Unremarkable bones. IMPRESSION: No acute abnormality.  Prominent stool throughout the colon. Electronically Signed   By: Percell Locus.D.  On: 01/06/2018 21:33        Scheduled Meds: . docusate sodium  100 mg Oral BID  . estradiol  1 mg Oral Daily  . heparin  5,000 Units Subcutaneous Q8H  . insulin aspart  0-5 Units Subcutaneous QHS  . insulin aspart  0-9 Units Subcutaneous TID WC  . insulin aspart  4 Units Subcutaneous TID WC  . insulin glargine  24 Units Subcutaneous QAC breakfast  . lubiprostone  24 mcg Oral BID WC  . pantoprazole  40 mg Oral Daily  . pravastatin  10 mg Oral QHS  . pregabalin  150 mg Oral BID   Continuous Infusions:    LOS: 0 days    Time spent: 30 minutes    Akili Corsetti Darleen Crocker,  DO [facility] Pager [phone]  If 7PM-7AM, please contact night-coverage [email or web address] Password [email or web address] 01/08/2018, 9:51 AM

## 2018-01-08 NOTE — Progress Notes (Addendum)
  Subjective:  Patient continues to complain of epigastric pain and nausea.  She states pain medications helping.  She did have a bowel movement last night when she passed small amount of formed stool or balls.  She denies melena or rectal bleeding.  Objective: Blood pressure (!) 94/58, pulse 76, temperature 97.6 F (36.4 C), temperature source Oral, resp. rate 18, height 5\' 7"  (1.702 m), weight 177 lb 3.2 oz (80.4 kg), SpO2 95 %. Patient is alert and in no acute distress. Abdomen is symmetrical.  Bowel sounds are normal.  On palpation it is soft.  She has mild midepigastric tenderness.  No organomegaly or masses.  Labs/studies Results:  Recent Labs    Jan 12, 2018 1925 01/08/18 0623  WBC 7.7 6.0  HGB 14.2 12.9  HCT 42.7 39.3  PLT 207 192    BMET  Recent Labs    01-12-2018 1925 01/08/18 0623  NA 131* 138  K 4.7 3.6  CL 93* 103  CO2 29 27  GLUCOSE 530* 211*  BUN 12 9  CREATININE 1.04* 0.66  CALCIUM 9.3 9.1    LFT  Recent Labs    01-12-2018 1925 01/08/18 0623  PROT 6.9 6.0*  ALBUMIN 3.9 3.4*  AST 17 22  ALT 19 15  ALKPHOS 144* 96  BILITOT 0.4 0.3  BILIDIR <0.1*  --   IBILI NOT CALCULATED  --       Assessment:  #1.  Chronic constipation.  Chronic constipation is multifactorial.  She needs colonoscopy both for diagnostic and screening purposes.  She did have bowel movement with Amitiza last night..  She would be prepped with GoLYTELY today.  #2.  Epigastric pain with nausea.  #3.  IDDM.  Glucose levels remain elevated.  But she is not in osmolar nonketotic stage.  Recommendations:  GoLYTELY prep today. EGD and colonoscopy in a.m. under monitored anesthesia care.

## 2018-01-09 ENCOUNTER — Observation Stay (HOSPITAL_COMMUNITY): Payer: Medicare HMO | Admitting: Anesthesiology

## 2018-01-09 ENCOUNTER — Encounter (HOSPITAL_COMMUNITY)
Admission: EM | Disposition: A | Discharge status: Home / Self Care | Payer: Self-pay | Source: Home / Self Care | Attending: Emergency Medicine

## 2018-01-09 ENCOUNTER — Encounter (HOSPITAL_COMMUNITY): Payer: Self-pay | Admitting: *Deleted

## 2018-01-09 DIAGNOSIS — E1065 Type 1 diabetes mellitus with hyperglycemia: Secondary | ICD-10-CM | POA: Diagnosis not present

## 2018-01-09 DIAGNOSIS — E785 Hyperlipidemia, unspecified: Secondary | ICD-10-CM | POA: Diagnosis not present

## 2018-01-09 DIAGNOSIS — F329 Major depressive disorder, single episode, unspecified: Secondary | ICD-10-CM | POA: Diagnosis not present

## 2018-01-09 DIAGNOSIS — K228 Other specified diseases of esophagus: Secondary | ICD-10-CM | POA: Diagnosis not present

## 2018-01-09 DIAGNOSIS — R110 Nausea: Secondary | ICD-10-CM | POA: Diagnosis not present

## 2018-01-09 DIAGNOSIS — I10 Essential (primary) hypertension: Secondary | ICD-10-CM | POA: Diagnosis not present

## 2018-01-09 DIAGNOSIS — F419 Anxiety disorder, unspecified: Secondary | ICD-10-CM | POA: Diagnosis not present

## 2018-01-09 DIAGNOSIS — N179 Acute kidney failure, unspecified: Secondary | ICD-10-CM | POA: Diagnosis not present

## 2018-01-09 DIAGNOSIS — Z800 Family history of malignant neoplasm of digestive organs: Secondary | ICD-10-CM | POA: Diagnosis not present

## 2018-01-09 DIAGNOSIS — E860 Dehydration: Secondary | ICD-10-CM | POA: Diagnosis not present

## 2018-01-09 DIAGNOSIS — K5909 Other constipation: Secondary | ICD-10-CM | POA: Diagnosis not present

## 2018-01-09 DIAGNOSIS — K219 Gastro-esophageal reflux disease without esophagitis: Secondary | ICD-10-CM | POA: Diagnosis not present

## 2018-01-09 DIAGNOSIS — R1013 Epigastric pain: Secondary | ICD-10-CM | POA: Diagnosis not present

## 2018-01-09 DIAGNOSIS — K5900 Constipation, unspecified: Secondary | ICD-10-CM | POA: Diagnosis not present

## 2018-01-09 HISTORY — PX: COLONOSCOPY WITH PROPOFOL: SHX5780

## 2018-01-09 HISTORY — PX: ESOPHAGOGASTRODUODENOSCOPY (EGD) WITH PROPOFOL: SHX5813

## 2018-01-09 LAB — COMPREHENSIVE METABOLIC PANEL
ALT: 19 U/L (ref 14–54)
AST: 22 U/L (ref 15–41)
Albumin: 3.6 g/dL (ref 3.5–5.0)
Alkaline Phosphatase: 75 U/L (ref 38–126)
Anion gap: 9 (ref 5–15)
BUN: 6 mg/dL (ref 6–20)
CHLORIDE: 106 mmol/L (ref 101–111)
CO2: 28 mmol/L (ref 22–32)
Calcium: 9.3 mg/dL (ref 8.9–10.3)
Creatinine, Ser: 0.57 mg/dL (ref 0.44–1.00)
GFR calc Af Amer: >60 mL/min (ref >60)
Glucose, Bld: 102 mg/dL — ABNORMAL HIGH (ref 65–99)
Potassium: 3.9 mmol/L (ref 3.5–5.1)
Sodium: 143 mmol/L (ref 135–145)
Total Bilirubin: 0.5 mg/dL (ref 0.3–1.2)
Total Protein: 6.6 g/dL (ref 6.5–8.1)

## 2018-01-09 LAB — CBC
HCT: 43.2 % (ref 36.0–46.0)
Hemoglobin: 14.4 g/dL (ref 12.0–15.0)
MCH: 29.7 pg (ref 26.0–34.0)
MCHC: 33.3 g/dL (ref 30.0–36.0)
MCV: 89.1 fL (ref 78.0–100.0)
PLATELETS: 206 10*3/uL (ref 150–400)
RBC: 4.85 MIL/uL (ref 3.87–5.11)
RDW: 12.8 % (ref 11.5–15.5)
WBC: 6.1 10*3/uL (ref 4.0–10.5)

## 2018-01-09 LAB — GLUCOSE, CAPILLARY
GLUCOSE-CAPILLARY: 43 mg/dL — AB (ref 65–99)
GLUCOSE-CAPILLARY: 121 mg/dL — AB (ref 65–99)
GLUCOSE-CAPILLARY: 162 mg/dL — AB (ref 65–99)
GLUCOSE-CAPILLARY: 144 mg/dL — AB (ref 65–99)
GLUCOSE-CAPILLARY: 133 mg/dL — AB (ref 65–99)
Glucose-Capillary: 57 mg/dL — ABNORMAL LOW (ref 65–99)
Glucose-Capillary: 142 mg/dL — ABNORMAL HIGH (ref 65–99)

## 2018-01-09 SURGERY — ESOPHAGOGASTRODUODENOSCOPY (EGD) WITH PROPOFOL
Anesthesia: Monitor Anesthesia Care

## 2018-01-09 MED ORDER — MEPERIDINE HCL 100 MG/ML IJ SOLN: 6.2500 mg | INTRAMUSCULAR | Status: DC | PRN

## 2018-01-09 MED ORDER — LIDOCAINE HCL (PF) 1 % IJ SOLN
INTRAMUSCULAR | Status: AC
  Filled 2018-01-09: qty 5

## 2018-01-09 MED ORDER — DEXTROSE 50 % IV SOLN
INTRAVENOUS | Status: AC
  Filled 2018-01-09: qty 50

## 2018-01-09 MED ORDER — NICOTINE 14 MG/24HR TD PT24: 14.0000 mg | MEDICATED_PATCH | Freq: Every day | TRANSDERMAL | 0 refills | Status: DC | PRN

## 2018-01-09 MED ORDER — DEXTROSE 50 % IV SOLN
25.0000 mL | Freq: Once | INTRAVENOUS | Status: AC
  Administered 2018-01-09: 25 mL via INTRAVENOUS

## 2018-01-09 MED ORDER — MIDAZOLAM HCL 2 MG/2ML IJ SOLN
INTRAMUSCULAR | Status: AC
  Filled 2018-01-09: qty 2

## 2018-01-09 MED ORDER — PROPOFOL 10 MG/ML IV BOLUS
INTRAVENOUS | Status: AC
  Filled 2018-01-09: qty 40

## 2018-01-09 MED ORDER — FLEET ENEMA 7-19 GM/118ML RE ENEM: 1.0000 | ENEMA | Freq: Every morning | RECTAL | 1 refills | Status: AC

## 2018-01-09 MED ORDER — PROPOFOL 10 MG/ML IV BOLUS
INTRAVENOUS | Status: DC | PRN
  Administered 2018-01-09 (×2): 20 mg via INTRAVENOUS

## 2018-01-09 MED ORDER — BUPROPION HCL 75 MG PO TABS: 75.0000 mg | ORAL_TABLET | Freq: Every morning | ORAL | 3 refills | Status: DC

## 2018-01-09 MED ORDER — LACTATED RINGERS IV SOLN: INTRAVENOUS | Status: DC

## 2018-01-09 MED ORDER — MAGNESIUM CITRATE PO SOLN
1.0000 | Freq: Once | ORAL | Status: DC
  Filled 2018-01-09: qty 296

## 2018-01-09 MED ORDER — LUBIPROSTONE 24 MCG PO CAPS: 24.0000 ug | ORAL_CAPSULE | Freq: Two times a day (BID) | ORAL | 3 refills | Status: AC

## 2018-01-09 MED ORDER — PROMETHAZINE HCL 25 MG/ML IJ SOLN: 6.2500 mg | INTRAMUSCULAR | Status: DC | PRN

## 2018-01-09 MED ORDER — DEXTROSE-NACL 5-0.45 % IV SOLN
INTRAVENOUS | Status: DC
  Administered 2018-01-09: 13:00:00 via INTRAVENOUS

## 2018-01-09 MED ORDER — PROPOFOL 500 MG/50ML IV EMUL
INTRAVENOUS | Status: DC | PRN
  Administered 2018-01-09: 150 ug/kg/min via INTRAVENOUS
  Administered 2018-01-09: 16:00:00 via INTRAVENOUS

## 2018-01-09 MED ORDER — MIDAZOLAM HCL 5 MG/5ML IJ SOLN
INTRAMUSCULAR | Status: DC | PRN
  Administered 2018-01-09: 2 mg via INTRAVENOUS

## 2018-01-09 MED ORDER — LACTATED RINGERS IV SOLN
INTRAVENOUS | Status: DC
  Administered 2018-01-09: 1000 mL via INTRAVENOUS

## 2018-01-09 MED ORDER — PEG 3350-KCL-NA BICARB-NACL 420 G PO SOLR
4000.0000 mL | Freq: Once | ORAL | Status: AC
  Administered 2018-01-09: 4000 mL via ORAL
  Filled 2018-01-09: qty 4000

## 2018-01-09 MED ORDER — MAGNESIUM CITRATE PO SOLN
1.0000 | Freq: Once | ORAL | Status: AC
  Administered 2018-01-09: 1 via ORAL
  Filled 2018-01-09: qty 296

## 2018-01-09 NOTE — Progress Notes (Signed)
Patient discharged home.  IV removed - WNL.  Reviewed AVS and medications - verbalizes understanding.  Instructed to follow up with PCP and GI.  No questions at this time.  IV removed and WNL.  Assisted off unit in NAD

## 2018-01-09 NOTE — Care Management Obs Status (Signed)
[facility] NOTIFICATION   Patient Details  Name: Braileigh Landenberger MRN: [id number] Date of Birth: 1971/03/25   Medicare Observation Status Notification Given:  Yes    Torry Istre, Chauncey Reading, RN 01/09/2018, 10:55 AM

## 2018-01-09 NOTE — Progress Notes (Signed)
Patient had another BM - small, hard round pieces of stool.  No liquid noted.  Patient becoming frustrated - tired of drinking prep and wanting to get procedure over with.  States that if the MD doesn't do it today she will just leave AMA.  Stool reported to endo nurse and made aware of situation.  Will speak with MD and report back.  Explained to patient the need for stool to be clear liquid for procedure.  Verbalizes understanding but states that she is "just tired and can't do anymore"

## 2018-01-09 NOTE — Progress Notes (Signed)
Brief EGD and colonoscopy note.  Small amount of food debris in proximal stomach otherwise normal EGD. This finding may indicate gastroparesis.  Complete colonoscopy secondary to presence of large amount of formed and semi-formed stool in the colon. Scope only could be advanced to distal transverse colon.  Recommendations:  Continue Amitiza at current dose. Low-fat high-fiber diet. Decrease Pelbreton dose to have and she should talk with PCP about coming off this medication. Use fleets enema daily unless has spontaneous bowel movement. She will keep stool diary and return for office visit in 4 weeks. She will be rescheduled for colonoscopy within the next few months.

## 2018-01-09 NOTE — Op Note (Signed)
[facility] Patient Name: [name] Procedure Date: 01/09/2018 4:00 PM MRN: [id number] Date of Birth: [birth year] Attending MD: Hildred Laser , MD CSN: [id number] Age: 47 Admit Type: Inpatient Procedure:                Colonoscopy Indications:              Constipation Providers:                Hildred Laser, MD, Janeece Riggers, RN, Randa Spike,                            Technician Referring MD:             Rory Percy, MD Medicines:                Propofol per Anesthesia Complications:            No immediate complications. Estimated Blood Loss:     Estimated blood loss: none. Procedure:                Pre-Anesthesia Assessment:                           - Prior to the procedure, a History and Physical                            was performed, and patient medications and                            allergies were reviewed. The patient's tolerance of                            previous anesthesia was also reviewed. The risks                            and benefits of the procedure and the sedation                            options and risks were discussed with the patient.                            All questions were answered, and informed consent                            was obtained. Prior Anticoagulants: The patient                            last took heparin 1 day prior to the procedure. ASA                            Grade Assessment: III - A patient with severe                            systemic disease. After reviewing the risks and  benefits, the patient was deemed in satisfactory                            condition to undergo the procedure.                           After obtaining informed consent, the colonoscope                            was passed under direct vision. Throughout the                            procedure, the patient's blood pressure, pulse, and                            oxygen saturations were monitored  continuously. The                            [id number] ([id number]) was introduced through the                            anus with the intention of advancing to the cecum.                            The scope was advanced to the transverse colon                            before the procedure was aborted. Medications were                            given. The colonoscopy was performed with                            difficulty due to poor bowel prep with stool                            present. The patient tolerated the procedure well.                            The quality of the bowel preparation was poor. The                            rectum was photographed. Scope In: 4:01:58 PM Scope Out: 4:21:14 PM Total Procedure Duration: 0 hours 19 minutes 16 seconds  Findings:      The perianal and digital rectal examinations were normal.      A large amount of stool was found in the rectum, in the recto-sigmoid       colon, in the sigmoid colon, in the descending colon, at the splenic       flexure and in the transverse colon, precluding visualization. 4 ounces       of Mg citrate instilled into region of splenic flexure. Impression:               - Preparation of the colon was poor  resulting in                            incomplete exam.                           - Stool in the rectum, in the recto-sigmoid colon,                            in the sigmoid colon, in the descending colon, at                            the splenic flexure and in the transverse colon.                           - No specimens collected. Moderate Sedation:      Per Anesthesia Care Recommendation:           - Return patient to hospital ward for possible                            discharge same day.                           - High fiber diet, diabetic (ADA) diet and low fat                            diet today.                           - Continue present medications.                           - Repeat  colonoscopy in 2 months. Procedure Code(s):        --- Professional ---                           [phone], 92, Colonoscopy, flexible; diagnostic,                            including collection of specimen(s) by brushing or                            washing, when performed (separate procedure) Diagnosis Code(s):        --- Professional ---                           K59.00, Constipation, unspecified CPT copyright 2017 American Medical Association. All rights reserved. The codes documented in this report are preliminary and upon coder review may  be revised to meet current compliance requirements. Hildred Laser, MD Hildred Laser, MD 01/09/2018 6:05:18 PM This report has been signed electronically. Number of Addenda: 0

## 2018-01-09 NOTE — Progress Notes (Addendum)
Nutrition Brief Note  Consult for Diabetes Education.    Pt was in procedure and so not able to see.  Left DM1 education information on table for pt to look at later.  Will follow up with pt tomorrow if she is not discharged by then.    Body mass index is 27.75 kg/m. Patient meets criteria for overweight based on current BMI.   Current diet order is NPO for GI procedure, was consuming approximately 65-75% of meals of CHO MOD/Heart Healthy diet. Labs and medications reviewed.   No nutrition interventions warranted at this time. If nutrition issues arise, please consult RD.

## 2018-01-09 NOTE — Progress Notes (Signed)
Patient started back on golytley prep.  Has had one formed BM this AM.

## 2018-01-09 NOTE — Op Note (Signed)
[facility] Patient Name: [name] Procedure Date: 01/09/2018 4:29 PM MRN: [id number] Date of Birth: [birth year] Attending MD: Hildred Laser , MD CSN: [id number] Age: 47 Admit Type: Inpatient Procedure:                Upper GI endoscopy Indications:              Epigastric abdominal pain, Nausea Providers:                Hildred Laser, MD, Janeece Riggers, RN, Randa Spike,                            Technician Referring MD:              Medicines:                Propofol per Anesthesia Complications:            No immediate complications. Estimated Blood Loss:     Estimated blood loss: none. Procedure:                Pre-Anesthesia Assessment:                           - Prior to the procedure, a History and Physical                            was performed, and patient medications and                            allergies were reviewed. The patient's tolerance of                            previous anesthesia was also reviewed. The risks                            and benefits of the procedure and the sedation                            options and risks were discussed with the patient.                            All questions were answered, and informed consent                            was obtained. Prior Anticoagulants: The patient                            last took heparin 1 day prior to the procedure. ASA                            Grade Assessment: III - A patient with severe                            systemic disease. After reviewing the risks and  benefits, the patient was deemed in satisfactory                            condition to undergo the procedure.                           After obtaining informed consent, the endoscope was                            passed under direct vision. Throughout the                            procedure, the patient's blood pressure, pulse, and                            oxygen saturations were  monitored continuously. The                            [id number] ([id number]) scope was introduced through the                            mouth, and advanced to the second part of duodenum.                            The upper GI endoscopy was accomplished without                            difficulty. The patient tolerated the procedure                            well. Findings:      The examined esophagus was normal.      The Z-line was irregular and was found 39 cm from the incisors.      A small amount of food (residue) was found in the gastric fundus,       gastric body and antrum.      The exam of the stomach was otherwise normal.      The duodenal bulb and second portion of the duodenum were normal. Impression:               - Normal esophagus.                           - Z-line irregular, 39 cm from the incisors.                           - A small amount of food (residue) in the stomach.                           - Normal duodenal bulb and second portion of the                            duodenum.                           -  No specimens collected.                           Comment: finding of food debris in stomach would                            suggest gastroparesis. Moderate Sedation:      Per Anesthesia Care Recommendation:           - Return patient to hospital ward for possible                            discharge same day.                           - Diabetic (ADA) diet and low fat diet today.                           - Continue present medications.                           - See the other procedure note for documentation of                            additional recommendations. Procedure Code(s):        --- Professional ---                           [phone], Esophagogastroduodenoscopy, flexible,                            transoral; diagnostic, including collection of                            specimen(s) by brushing or washing, when performed                             (separate procedure) Diagnosis Code(s):        --- Professional ---                           K22.8, Other specified diseases of esophagus                           R10.13, Epigastric pain                           R11.0, Nausea CPT copyright 2017 American Medical Association. All rights reserved. The codes documented in this report are preliminary and upon coder review may  be revised to meet current compliance requirements. Hildred Laser, MD Hildred Laser, MD 01/09/2018 6:02:53 PM This report has been signed electronically. Number of Addenda: 0

## 2018-01-09 NOTE — Discharge Summary (Signed)
Physician Discharge Summary  [name] GQQ:[id number] DOB: March 25, 1971 DOA: 01/06/2018  PCP: Karsten Ro, DO  Admit date: 01/06/2018  Discharge date: 01/09/2018  Admitted From:Home  Disposition:  Home  Recommendations for Outpatient Follow-up:  1. Follow up with PCP in 1-2 weeks 2. Follow up with GI Dr. Laural Golden in 4 weeks with stool log  Home Health:N/A  Equipment/Devices:N/A  Discharge Condition:Stable  CODE STATUS: Full  Diet recommendation: Heart Healthy/Carb Modified  Brief/Interim Summary: [name] a 47 y.o.femalewith medical history significant ofabdominal wall mass of right flank, benign paroxysmal vertigo, chronic neck pain, depression with anxiety, type 1 diabetes since age 68, chronic back pain, GERD, hiatal hernia, hyperlipidemia, hypertension, IBS, migraine headaches, postherpetic headache, diabetic peripheral neuropathy, history of TIA, tobacco use who is coming to the emergency after her CBG read her showed greater than 600 at home.   She was also noted to have severe constipation with no bowel movement in the last 7 weeks and has also had some abdominal pain with nausea and vomiting over the last 2 days.  CT of her abdomen and pelvis has demonstrated a high stool burden. Colonoscopy and EGD performed on 6/10; day of discharge. Still with high stool burden on lower endoscopy and signs of gastroparesis on upper endoscopy. Recommended for Amitiza and daily enemas with follow up to GI in 4 weeks.  Discharge Diagnoses:  Principal Problem:   Uncontrolled diabetes mellitus ([place]) Active Problems:   Tobacco abuse   Hypertension   Hyperlipidemia   Anxiety   Gastro-esophageal reflux disease without esophagitis   Family hx of colon cancer   Depression   Peripheral neuropathy   1. Severe hyperglycemia secondary to uncontrolled type 1 diabetes-labile.  HgA1c is 10.8% during this admission. There is a question of dietary compliance vs brittle nature of  her condition. Will need reliable endocrine follow up in the near future. Continue current medications. 2. Severe constipation likely neurogenic due to poorly controlled diabetes; also with suspected gastroparesis.  Will follow up with Dr. Laural Golden as an outpatient in 4 weeks. Continue on enemas every day. Amitiza daily as prescribed. Wellbutrin dose suspected to be the culprit with dose reduced to 75mg  daily. 3. Hypertension.  Resume home lisinopril. 4. AKI secondary to dehydration-resolved. 5. Dyslipidemia.  Continue pravastatin. 6. Anxiety/depression.  Continue diazepam. 7. GERD.  Continue PPI. 8. Peripheral neuropathy.  Continue Lyrica twice a day. 9. Tobacco abuse.  Nicotine replacement and cessation counseling. Patches on DC given.    Discharge Instructions  Discharge Instructions    Diet - low sodium heart healthy   Complete by:  As directed    Increase activity slowly   Complete by:  As directed      Allergies as of 01/09/2018      Reactions   Zocor [simvastatin] Other (See Comments)   Muscle aches and pains   Clindamycin/lincomycin Rash   Codeine Rash   Penicillins Rash   Has patient had a PCN reaction causing immediate rash, facial/tongue/throat swelling, SOB or lightheadedness with hypotension: Yes Has patient had a PCN reaction causing severe rash involving mucus membranes or skin necrosis: Yes Has patient had a PCN reaction that required hospitalization: Yes Has patient had a PCN reaction occurring within the last 10 years: No If all of the above answers are "NO", then may proceed with Cephalosporin use.   Sulfa Antibiotics Rash         Medication List    STOP taking these medications   buPROPion 150  MG 12 hr tablet Commonly known as:  ZYBAN   polyethylene glycol packet Commonly known as:  MIRALAX / GLYCOLAX   polyethylene glycol-electrolytes 420 g solution Commonly known as:  NuLYTELY/GoLYTELY     TAKE these medications   buPROPion 75 MG tablet Commonly  known as:  WELLBUTRIN Take 1 tablet (75 mg total) by mouth every morning.   cyclobenzaprine 10 MG tablet Commonly known as:  FLEXERIL Take 10 mg by mouth as needed.   diazepam 10 MG tablet Commonly known as:  VALIUM Take 1 tablet by mouth 4 (four) times daily - after meals and at bedtime.   docusate sodium 100 MG capsule Commonly known as:  COLACE Take 100 mg by mouth 2 (two) times daily.   EPIPEN 2-PAK 0.3 mg/0.3 mL Soaj injection Generic drug:  EPINEPHrine Inject 0.3 mg into the muscle once.   estradiol 1 MG tablet Commonly known as:  ESTRACE Take 1 mg by mouth daily.   Fish Oil 1200 MG Cpdr Take 1 capsule by mouth daily.   HUMALOG KWIKPEN 100 UNIT/ML KiwkPen Generic drug:  insulin lispro Inject 10 Units into the skin 3 (three) times daily with meals. + SLIDING SCALE   Insulin Glargine 100 UNIT/ML Solostar Pen Commonly known as:  LANTUS SOLOSTAR Inject 24 Units into the skin daily before breakfast.   lisinopril 5 MG tablet Commonly known as:  PRINIVIL,ZESTRIL Take 5 mg by mouth daily.   lubiprostone 24 MCG capsule Commonly known as:  AMITIZA Take 1 capsule (24 mcg total) by mouth 2 (two) times daily with a meal.   LYRICA 150 MG capsule Generic drug:  pregabalin Take 150 mg by mouth 2 (two) times daily.   Melatonin 10 MG Caps Take 10 mg by mouth at bedtime.   nicotine 14 mg/24hr patch Commonly known as:  NICODERM CQ - dosed in mg/24 hours Place 1 patch (14 mg total) onto the skin daily as needed (Nicotine withdrawal symptoms.).   omeprazole 40 MG capsule Commonly known as:  PRILOSEC Take 40 mg by mouth as needed.   pravastatin 10 MG tablet Commonly known as:  PRAVACHOL Take 10 mg by mouth at bedtime.   sodium phosphate 7-19 GM/118ML Enem Place 133 mLs (1 enema total) rectally every morning.      Follow-up Information    Karsten Ro, DO Follow up in 1 week(s).   Specialty:  Family Medicine Contact information: [address] [place] [address] [phone]        Rogene Houston, MD Follow up in 4 week(s).   Specialty:  Gastroenterology Why:  bring stool log. Contact information: [address], [address] [place] [address] [phone]          Allergies  Allergen Reactions  . Zocor [Simvastatin] Other (See Comments)    Muscle aches and pains  . Clindamycin/Lincomycin Rash  . Codeine Rash  . Penicillins Rash    Has patient had a PCN reaction causing immediate rash, facial/tongue/throat swelling, SOB or lightheadedness with hypotension: Yes Has patient had a PCN reaction causing severe rash involving mucus membranes or skin necrosis: Yes Has patient had a PCN reaction that required hospitalization: Yes Has patient had a PCN reaction occurring within the last 10 years: No If all of the above answers are "NO", then may proceed with Cephalosporin use.   . Sulfa Antibiotics Rash         Consultations:  GI Dr. Laural Golden   Procedures/Studies: Dg Abd 1 View  Result Date: 12/21/2017 CLINICAL DATA:  Severe constipation for 5 weeks EXAM: ABDOMEN - 1 VIEW COMPARISON:  Barium enema 12/12/2017 FINDINGS: Retained contrast in nondistended distal transverse through sigmoid colon. Significantly increased stool in proximal half of colon. No small bowel dilatation or bowel wall thickening. Surgical clips RIGHT upper quadrant likely cholecystectomy. BILATERAL pelvic phleboliths. IMPRESSION: Significant retained contrast in the distal half of the colon 9 days post enema. Significantly increased stool throughout the proximal half of the colon. Electronically Signed   By: Lavonia Dana M.D.   On: 12/21/2017 16:54   Dg Abd 1 View  Result Date: 12/12/2017 CLINICAL DATA:  Constipation for several weeks with nausea and vomiting EXAM: ABDOMEN - 1 VIEW COMPARISON:  12/09/2017 FINDINGS: Material is again noted throughout the colon consistent with the given clinical history of constipation. No obstructive changes are  noted. No abnormal mass or abnormal calcifications are seen. IMPRESSION: Findings of constipation stable from the previous exam. Electronically Signed   By: Inez Catalina M.D.   On: 12/12/2017 12:46   Ct Abdomen Pelvis W Contrast  Result Date: 01/06/2018 CLINICAL DATA:  Lower abdominal pain and nausea. Abdominal distention. No bowel movements for the past 7 weeks. EXAM: CT ABDOMEN AND PELVIS WITH CONTRAST TECHNIQUE: Multidetector CT imaging of the abdomen and pelvis was performed using the standard protocol following bolus administration of intravenous contrast. CONTRAST:  1102mL ISOVUE-300 IOPAMIDOL (ISOVUE-300) INJECTION 61% COMPARISON:  12/09/2017. FINDINGS: Lower chest: Minimal bilateral dependent atelectasis. Hepatobiliary: No focal liver abnormality is seen. Status post cholecystectomy. No biliary dilatation. Pancreas: Diffuse pancreatic atrophy. Spleen: Normal in size without focal abnormality. Adrenals/Urinary Tract: Adrenal glands are unremarkable. Kidneys are normal, without renal calculi, focal lesion, or hydronephrosis. Bladder is unremarkable. Stomach/Bowel: Prominent stool throughout the colon. Surgically absent appendix. Unremarkable stomach and small bowel. Vascular/Lymphatic: No significant vascular findings are present. No enlarged abdominal or pelvic lymph nodes. Reproductive: Status post hysterectomy. No adnexal masses. Other: No abdominal wall hernia or abnormality. No abdominopelvic ascites. Musculoskeletal: Unremarkable bones. IMPRESSION: No acute abnormality.  Prominent stool throughout the colon. Electronically Signed   By: Claudie Revering M.D.   On: 01/06/2018 21:33   Dg Colon W/water Sol Cm  Result Date: 12/12/2017 CLINICAL DATA:  Constipation, no relief with laxatives. EXAM: COLON WITH WATER SOLUTION CONTRAST COMPARISON:  None. FINDINGS: No evidence of colonic stricture. There is a large amount of retained stool, particularly in the right colon and transverse colon. Reflux of contrast  into the distal small bowel with fecalization of the distal ileum. IMPRESSION: Large stool burden in the colon, particularly right colon and transverse colon. No stricture. Electronically Signed   By: Rolm Baptise M.D.   On: 12/12/2017 15:22    Discharge Exam: Vitals:   01/09/18 1630 01/09/18 1636  BP: 108/79 105/78  Pulse: 81 85  Resp: 20 18  Temp: 97.8 F (36.6 C)   SpO2: 95% 95%   Vitals:   01/09/18 1513 01/09/18 1515 01/09/18 1630 01/09/18 1636  BP: 113/79 113/79 108/79 105/78  Pulse:   81 85  Resp: 16 14 20 18   Temp: 98.4 F (36.9 C)  97.8 F (36.6 C)   TempSrc: Oral     SpO2: 93% 95% 95% 95%  Weight:      Height:        General: Pt is alert, awake, not in acute distress Cardiovascular: RRR, S1/S2 +, no rubs, no gallops Respiratory: CTA bilaterally, no wheezing, no rhonchi Abdominal: Soft, NT, ND, bowel sounds + Extremities: no  edema, no cyanosis    The results of significant diagnostics from this hospitalization (including imaging, microbiology, ancillary and laboratory) are listed below for reference.     Microbiology: No results found for this or any previous visit (from the past 240 hour(s)).   Labs: BNP (last 3 results) No results for input(s): BNP in the last 8760 hours. Basic Metabolic Panel: Recent Labs  Lab 01/06/18 1925 01/08/18 0623 01/09/18 0359  NA 131* 138 143  K 4.7 3.6 3.9  CL 93* 103 106  CO2 29 27 28   GLUCOSE 530* 211* 102*  BUN 12 9 6   CREATININE 1.04* 0.66 0.57  CALCIUM 9.3 9.1 9.3  MG 2.0  --   --   PHOS 3.9  --   --    Liver Function Tests: Recent Labs  Lab 01/06/18 1925 01/08/18 0623 01/09/18 0359  AST 17 22 22   ALT 19 15 19   ALKPHOS 144* 96 75  BILITOT 0.4 0.3 0.5  PROT 6.9 6.0* 6.6  ALBUMIN 3.9 3.4* 3.6   Recent Labs  Lab 01/06/18 1925  LIPASE 22   No results for input(s): AMMONIA in the last 168 hours. CBC: Recent Labs  Lab 01/06/18 1925 01/08/18 0623 01/09/18 0359  WBC 7.7 6.0 6.1  HGB 14.2 12.9  14.4  HCT 42.7 39.3 43.2  MCV 89.9 90.1 89.1  PLT 207 192 206   Cardiac Enzymes: No results for input(s): CKTOTAL, CKMB, CKMBINDEX, TROPONINI in the last 168 hours. BNP: Invalid input(s): POCBNP CBG: Recent Labs  Lab 01/09/18 0506 01/09/18 0748 01/09/18 1137 01/09/18 1235 01/09/18 1506  GLUCAP 121* 162* 57* 144* 142*   D-Dimer No results for input(s): DDIMER in the last 72 hours. Hgb A1c Recent Labs    01/08/18 0623  HGBA1C 10.8*   Lipid Profile No results for input(s): CHOL, HDL, LDLCALC, TRIG, CHOLHDL, LDLDIRECT in the last 72 hours. Thyroid function studies No results for input(s): TSH, T4TOTAL, T3FREE, THYROIDAB in the last 72 hours.  Invalid input(s): FREET3 Anemia work up No results for input(s): VITAMINB12, FOLATE, FERRITIN, TIBC, IRON, RETICCTPCT in the last 72 hours. Urinalysis    Component Value Date/Time   COLORURINE COLORLESS (A) 01/06/2018 2101   APPEARANCEUR CLEAR 01/06/2018 2101   LABSPEC 1.012 01/06/2018 2101   PHURINE 7.0 01/06/2018 2101   GLUCOSEU >=500 (A) 01/06/2018 2101   HGBUR NEGATIVE 01/06/2018 2101   BILIRUBINUR NEGATIVE 01/06/2018 2101   KETONESUR NEGATIVE 01/06/2018 2101   PROTEINUR NEGATIVE 01/06/2018 2101   UROBILINOGEN 0.2 12/21/2014 0050   NITRITE NEGATIVE 01/06/2018 2101   LEUKOCYTESUR NEGATIVE 01/06/2018 2101   Sepsis Labs Invalid input(s): PROCALCITONIN,  WBC,  LACTICIDVEN Microbiology No results found for this or any previous visit (from the past 240 hour(s)).   Time coordinating discharge: 40 minutes  SIGNED:   Rodena Goldmann, DO [facility] 01/09/2018, 4:45 PM Pager [phone]  If 7PM-7AM, please contact night-coverage [email or web address] Password [email or web address]

## 2018-01-09 NOTE — Progress Notes (Signed)
PROGRESS NOTE    Raina Sole  YTK:[id number] DOB: [birth year] DOA: 01/06/2018 PCP: Karsten Ro, DO   Brief Narrative:   [name] is a 47 y.o. female with medical history significant of abdominal wall mass of right flank, benign paroxysmal vertigo, chronic neck pain, depression with anxiety, type 1 diabetes since age 22, chronic back pain, GERD, hiatal hernia, hyperlipidemia, hypertension, IBS, migraine headaches, postherpetic headache, diabetic peripheral neuropathy, history of TIA, tobacco use who is coming to the emergency after her CBG read her showed greater than 600 at home.    She was also noted to have severe constipation with no bowel movement in the last 7 weeks and has also had some abdominal pain with nausea and vomiting over the last 2 days.  CT of her abdomen and pelvis has demonstrated a high stool burden.   Assessment & Plan:   Principal Problem:   Uncontrolled diabetes mellitus ([place]) Active Problems:   Tobacco abuse   Hypertension   Hyperlipidemia   Anxiety   Gastro-esophageal reflux disease without esophagitis   Family hx of colon cancer   Depression   Peripheral neuropathy   1. Severe hyperglycemia secondary to uncontrolled type 1 diabetes-labile.  There is a question of dietary compliance vs brittle nature of her condition. GI started on D5 IVF this am due to hypoglycemia. Will need reliable endocrine follow up on DC. 2. Severe constipation likely neurogenic due to poorly controlled diabetes.  Further evaluation per GI with plans for EGD and colonoscopy today. 3. Hypertension.  Currently soft blood pressure readings.  Hold ACE inhibitor for now. 4. AKI secondary to dehydration.  Currently resolved and back to baseline, will continue to monitor. 5. Dyslipidemia.  Continue pravastatin. 6. Anxiety/depression.  Continue diazepam and will discontinue bupropion as this appears to be another causative agent and patient's constipation issues. 7. GERD.   Continue PPI. 8. Peripheral neuropathy.  Continue Lyrica twice a day. 9. Tobacco abuse.  Nicotine replacement and cessation counseling.   DVT prophylaxis:Heparin  Code Status: Full Family Communication: Fiancee at bedside Disposition Plan: GI evaluation for stool impaction with endoscopy today   Consultants:   GI Dr. Laural Golden  Procedures:   None  Antimicrobials:   None   Subjective: Patient seen and evaluated today with no new acute complaints or concerns.  She has had minimal liquid stool output. She continues to have episodes of hypoglycemia along with hyperglycemia.  Objective: Vitals:   01/09/18 0504 01/09/18 1427 01/09/18 1513 01/09/18 1515  BP: 96/67 109/75 113/79 113/79  Pulse: 85 83    Resp: 18 20 16 14   Temp: 97.8 F (36.6 C) 97.9 F (36.6 C) 98.4 F (36.9 C)   TempSrc: Oral Oral Oral   SpO2: 95% 94% 93% 95%  Weight:      Height:        Intake/Output Summary (Last 24 hours) at 01/09/2018 1526 Last data filed at 01/09/2018 1500 Gross per 24 hour  Intake 841.67 ml  Output -  Net 841.67 ml   Filed Weights   01/06/18 1845 01/06/18 2313  Weight: 78.5 kg (173 lb) 80.4 kg (177 lb 3.2 oz)    Examination:  General exam: Appears calm and comfortable  Respiratory system: Clear to auscultation. Respiratory effort normal. Cardiovascular system: S1 & S2 heard, RRR. No JVD, murmurs, rubs, gallops or clicks. No pedal edema. Gastrointestinal system: Abdomen is nondistended, soft and nontender. No organomegaly or masses felt. Normal bowel sounds heard. Central nervous system: Alert and oriented.  No focal neurological deficits. Extremities: Symmetric 5 x 5 power. Skin: No rashes, lesions or ulcers Psychiatry: Judgement and insight appear normal. Mood & affect appropriate.     Data Reviewed: I have personally reviewed following labs and imaging studies  CBC: Recent Labs  Lab 01/06/18 1925 01/08/18 0623 01/09/18 0359  WBC 7.7 6.0 6.1  HGB 14.2 12.9 14.4    HCT 42.7 39.3 43.2  MCV 89.9 90.1 89.1  PLT 207 192 657   Basic Metabolic Panel: Recent Labs  Lab 01/06/18 1925 01/08/18 0623 01/09/18 0359  NA 131* 138 143  K 4.7 3.6 3.9  CL 93* 103 106  CO2 29 27 28   GLUCOSE 530* 211* 102*  BUN 12 9 6   CREATININE 1.04* 0.66 0.57  CALCIUM 9.3 9.1 9.3  MG 2.0  --   --   PHOS 3.9  --   --    GFR: Estimated Creatinine Clearance: 94.8 mL/min (by C-G formula based on SCr of 0.57 mg/dL). Liver Function Tests: Recent Labs  Lab 01/06/18 1925 01/08/18 0623 01/09/18 0359  AST 17 22 22   ALT 19 15 19   ALKPHOS 144* 96 75  BILITOT 0.4 0.3 0.5  PROT 6.9 6.0* 6.6  ALBUMIN 3.9 3.4* 3.6   Recent Labs  Lab 01/06/18 1925  LIPASE 22   No results for input(s): AMMONIA in the last 168 hours. Coagulation Profile: No results for input(s): INR, PROTIME in the last 168 hours. Cardiac Enzymes: No results for input(s): CKTOTAL, CKMB, CKMBINDEX, TROPONINI in the last 168 hours. BNP (last 3 results) No results for input(s): PROBNP in the last 8760 hours. HbA1C: Recent Labs    01/08/18 0623  HGBA1C 10.8*   CBG: Recent Labs  Lab 01/09/18 0506 01/09/18 0748 01/09/18 1137 01/09/18 1235 01/09/18 1506  GLUCAP 121* 162* 57* 144* 142*   Lipid Profile: No results for input(s): CHOL, HDL, LDLCALC, TRIG, CHOLHDL, LDLDIRECT in the last 72 hours. Thyroid Function Tests: No results for input(s): TSH, T4TOTAL, FREET4, T3FREE, THYROIDAB in the last 72 hours. Anemia Panel: No results for input(s): VITAMINB12, FOLATE, FERRITIN, TIBC, IRON, RETICCTPCT in the last 72 hours. Sepsis Labs: No results for input(s): PROCALCITON, LATICACIDVEN in the last 168 hours.  No results found for this or any previous visit (from the past 240 hour(s)).     Radiology Studies: No results found.      Scheduled Meds: . [MAR Hold] docusate sodium  100 mg Oral BID  . [MAR Hold] estradiol  1 mg Oral Daily  . [MAR Hold] insulin aspart  0-5 Units Subcutaneous QHS  .  [MAR Hold] insulin aspart  0-9 Units Subcutaneous TID WC  . [MAR Hold] insulin aspart  4 Units Subcutaneous TID WC  . [MAR Hold] insulin glargine  24 Units Subcutaneous QAC breakfast  . [MAR Hold] lubiprostone  24 mcg Oral BID WC  . [MAR Hold] pantoprazole  40 mg Oral Daily  . [MAR Hold] pravastatin  10 mg Oral QHS  . [MAR Hold] pregabalin  150 mg Oral BID   Continuous Infusions: . sodium chloride    . dextrose 5 % and 0.45% NaCl 125 mL/hr at 01/09/18 1304  . lactated ringers 1,000 mL (01/09/18 1522)     LOS: 0 days    Time spent: 30 minutes    Demian Maisel Darleen Crocker, DO [facility] Pager [phone]  If 7PM-7AM, please contact night-coverage [email or web address] Password [facility] 01/09/2018, 3:26 PM

## 2018-01-09 NOTE — Anesthesia Postprocedure Evaluation (Signed)
Anesthesia Post Note  Patient: Andrina Locken  Procedure(s) Performed: ESOPHAGOGASTRODUODENOSCOPY (EGD) WITH PROPOFOL (N/A ) COLONOSCOPY WITH PROPOFOL (N/A )  Patient location during evaluation: PACU Anesthesia Type: MAC Level of consciousness: awake and alert and oriented Pain management: pain level controlled Vital Signs Assessment: post-procedure vital signs reviewed and stable Respiratory status: spontaneous breathing Cardiovascular status: blood pressure returned to baseline and stable Postop Assessment: no apparent nausea or vomiting Anesthetic complications: no     Last Vitals:  Vitals:   01/09/18 1630 01/09/18 1636  BP: 108/79 105/78  Pulse: 81 85  Resp: 20 18  Temp: 36.6 C   SpO2: 95% 95%    Last Pain:  Vitals:   01/09/18 1630  TempSrc:   PainSc: 0-No pain                 Hye Trawick

## 2018-01-09 NOTE — Progress Notes (Signed)
Patient drank 3/4 golytely, had 10mg  dulcolax, a fleet enema, and a bottle of magnesium citrate.  Patient only had  3 tiny bowel movements.  Patient not complaining of bloating or abdominal pain. MD aware of all medications given and no results from treatments. Will continue to monitor patient.

## 2018-01-09 NOTE — Progress Notes (Signed)
Patient is still passing stool.  Now it is liquid. She was given Dulcolax and mag citrate in addition to GoLYTELY. She was begun on another gallon of GoLYTELY this morning. Has not vomited any.  She denies abdominal pain.  She just feels weak. Glucose earlier today was 54.  Glucose at noon was 144. Abdomen is soft with mild midepigastric tenderness.  No organomegaly or masses. Patient drinking another dose of mag citrate. EGD and colonoscopy later today. Patient begun on D5 half-normal saline at rate of 125 mL/h.

## 2018-01-09 NOTE — Progress Notes (Signed)
Patient has consumed close to half of prep.  States her last BM was becoming more liquid.  Instructed to patient to make RN aware the next time she has BM and not to flush so that stool can be assessed.

## 2018-01-09 NOTE — Anesthesia Preprocedure Evaluation (Addendum)
Anesthesia Evaluation  Patient identified by MRN, date of birth, ID band Patient awake    Reviewed: Allergy & Precautions, H&P , NPO status , Patient's Chart, lab work & pertinent test results, reviewed documented beta blocker date and time   Airway Mallampati: II  TM Distance: >3 FB Neck ROM: full    Dental no notable dental hx. (+) Teeth Intact   Pulmonary neg pulmonary ROS, Current Smoker,    Pulmonary exam normal breath sounds clear to auscultation       Cardiovascular Exercise Tolerance: Good hypertension, On Medications negative cardio ROS   Rhythm:regular Rate:Normal     Neuro/Psych  Headaches, PSYCHIATRIC DISORDERS Anxiety Depression TIA Neuromuscular disease negative neurological ROS  negative psych ROS   GI/Hepatic negative GI ROS, Neg liver ROS, hiatal hernia, GERD  ,  Endo/Other  negative endocrine ROSdiabetes, Well Controlled, Type 1, Insulin Dependent  Renal/GU negative Renal ROS  negative genitourinary   Musculoskeletal   Abdominal   Peds  Hematology negative hematology ROS (+)   Anesthesia Other Findings FBS preop 142 H/o TIAs, no specific tx, last TIA "years ago  Reproductive/Obstetrics negative OB ROS                             Anesthesia Physical Anesthesia Plan  ASA: III  Anesthesia Plan: MAC   Post-op Pain Management:    Induction:   PONV Risk Score and Plan:   Airway Management Planned:   Additional Equipment:   Intra-op Plan:   Post-operative Plan:   Informed Consent: I have reviewed the patients History and Physical, chart, labs and discussed the procedure including the risks, benefits and alternatives for the proposed anesthesia with the patient or authorized representative who has indicated his/her understanding and acceptance.   Dental Advisory Given  Plan Discussed with: CRNA and Anesthesiologist  Anesthesia Plan Comments:         Anesthesia Quick Evaluation

## 2018-01-09 NOTE — Transfer of Care (Signed)
Immediate Anesthesia Transfer of Care Note  Patient: [name]  Procedure(s) Performed: ESOPHAGOGASTRODUODENOSCOPY (EGD) WITH PROPOFOL (N/A ) COLONOSCOPY WITH PROPOFOL (N/A )  Patient Location: PACU  Anesthesia Type:MAC  Level of Consciousness: awake, alert  and oriented  Airway & Oxygen Therapy: Patient Spontanous Breathing  Post-op Assessment: Report given to RN  Post vital signs: Reviewed and stable  Last Vitals:  Vitals Value Taken Time  BP 108/79 01/09/2018  4:30 PM  Temp 36.6 C 01/09/2018  4:30 PM  Pulse 85 01/09/2018  4:32 PM  Resp 18 01/09/2018  4:32 PM  SpO2 98 % 01/09/2018  4:32 PM  Vitals shown include unvalidated device data.  Last Pain:  Vitals:   01/09/18 1630  TempSrc:   PainSc: (P) 0-No pain      Patients Stated Pain Goal: 6 (88/50/27 7412)  Complications: No apparent anesthesia complications

## 2018-01-09 NOTE — Progress Notes (Signed)
Hypoglycemic Event  CBG: 54  Treatment: D50 IV 25 mL  Symptoms: Shaky  Follow-up CBG: Time:1230  CBG Result:144  Possible Reasons for Event: Inadequate meal intake  Comments/MD notified:patient NPo for endo - D5 given IV per protocol and increased to 144.  Md paged and made aware.  Dr Laural Golden in with patient and ordered fluids.    Berneice Gandy Dixon

## 2018-01-09 NOTE — Care Management Note (Signed)
Case Management Note  Patient Details  Name: [name] MRN: [id number] Date of Birth: 1971/01/25  Subjective/Objective:  Uncontrolled diabetes, epigastric pain, nausea.                  Action/Plan: Consulted for information on continuous glucose monitoring system.  Printed out information for patient. She reports she has tried [employer] in the past. Encouraged patient to discuss with PCP- and maybe elect a referral for Endocrinologist.   Expected Discharge Date:                  Expected Discharge Plan:  Home/Self Care  In-House Referral:     Discharge planning Services  CM Consult, Other - See comment(continuous CBG monitoring information)  Post Acute Care Choice:    Choice offered to:     DME Arranged:    DME Agency:     HH Arranged:    HH Agency:     Status of Service:  Completed, signed off  If discussed at [employer] of Stay Meetings, dates discussed:    Additional Comments:  Garwood Wentzell, Chauncey Reading, RN 01/09/2018, 10:57 AM

## 2018-01-11 ENCOUNTER — Telehealth (INDEPENDENT_AMBULATORY_CARE_PROVIDER_SITE_OTHER): Payer: Self-pay | Admitting: *Deleted

## 2018-01-11 ENCOUNTER — Encounter (HOSPITAL_COMMUNITY): Payer: Self-pay | Admitting: Internal Medicine

## 2018-01-11 NOTE — Telephone Encounter (Signed)
Patient called and says that Dr.Rehman wanted her to have TCS in 1 month. She was previously

## 2018-01-11 NOTE — Telephone Encounter (Signed)
Patient states that she is to have a TCS in 1 month. She is currently on for 01/19/2018. She has her Prep ,she will need to have updated instructions sent.  In looking at Dr.Rehman's note it appears that the patient should have another TCS in 2 months.  Will address with Dr.Rehman.

## 2018-01-16 ENCOUNTER — Encounter (INDEPENDENT_AMBULATORY_CARE_PROVIDER_SITE_OTHER): Payer: Self-pay | Admitting: *Deleted

## 2018-01-20 DIAGNOSIS — E1065 Type 1 diabetes mellitus with hyperglycemia: Secondary | ICD-10-CM | POA: Diagnosis not present

## 2018-01-21 ENCOUNTER — Encounter (HOSPITAL_COMMUNITY): Payer: Self-pay | Admitting: Emergency Medicine

## 2018-01-21 ENCOUNTER — Emergency Department (HOSPITAL_COMMUNITY)
Admission: EM | Admit: 2018-01-21 | Discharge: 2018-01-21 | Disposition: A | Discharge status: Home / Self Care | Payer: Medicare HMO | Attending: Emergency Medicine | Admitting: Emergency Medicine

## 2018-01-21 ENCOUNTER — Other Ambulatory Visit: Payer: Self-pay

## 2018-01-21 DIAGNOSIS — F17210 Nicotine dependence, cigarettes, uncomplicated: Secondary | ICD-10-CM | POA: Diagnosis not present

## 2018-01-21 DIAGNOSIS — E1165 Type 2 diabetes mellitus with hyperglycemia: Secondary | ICD-10-CM | POA: Diagnosis not present

## 2018-01-21 DIAGNOSIS — E1140 Type 2 diabetes mellitus with diabetic neuropathy, unspecified: Secondary | ICD-10-CM | POA: Insufficient documentation

## 2018-01-21 DIAGNOSIS — Z79899 Other long term (current) drug therapy: Secondary | ICD-10-CM | POA: Diagnosis not present

## 2018-01-21 DIAGNOSIS — I10 Essential (primary) hypertension: Secondary | ICD-10-CM | POA: Diagnosis not present

## 2018-01-21 DIAGNOSIS — R197 Diarrhea, unspecified: Secondary | ICD-10-CM | POA: Diagnosis not present

## 2018-01-21 DIAGNOSIS — Z794 Long term (current) use of insulin: Secondary | ICD-10-CM | POA: Insufficient documentation

## 2018-01-21 DIAGNOSIS — R739 Hyperglycemia, unspecified: Secondary | ICD-10-CM

## 2018-01-21 LAB — URINALYSIS, ROUTINE W REFLEX MICROSCOPIC
BILIRUBIN URINE: NEGATIVE
Bacteria, UA: NONE SEEN
HGB URINE DIPSTICK: NEGATIVE
KETONES UR: NEGATIVE mg/dL
LEUKOCYTES UA: NEGATIVE
NITRITE: NEGATIVE
PH: 6 (ref 5.0–8.0)
Protein, ur: NEGATIVE mg/dL
Specific Gravity, Urine: 1.018 (ref 1.005–1.030)

## 2018-01-21 LAB — COMPREHENSIVE METABOLIC PANEL
ALK PHOS: 111 U/L (ref 38–126)
ALT: 23 U/L (ref 14–54)
ANION GAP: 7 (ref 5–15)
AST: 23 U/L (ref 15–41)
Albumin: 3.4 g/dL — ABNORMAL LOW (ref 3.5–5.0)
BILIRUBIN TOTAL: 0.4 mg/dL (ref 0.3–1.2)
BUN: 6 mg/dL (ref 6–20)
CO2: 24 mmol/L (ref 22–32)
Calcium: 8.7 mg/dL — ABNORMAL LOW (ref 8.9–10.3)
Chloride: 102 mmol/L (ref 101–111)
Creatinine, Ser: 0.71 mg/dL (ref 0.44–1.00)
Glucose, Bld: 467 mg/dL — ABNORMAL HIGH (ref 65–99)
Potassium: 4.5 mmol/L (ref 3.5–5.1)
Sodium: 133 mmol/L — ABNORMAL LOW (ref 135–145)
TOTAL PROTEIN: 6.1 g/dL — AB (ref 6.5–8.1)

## 2018-01-21 LAB — CBG MONITORING, ED
GLUCOSE-CAPILLARY: 411 mg/dL — AB (ref 65–99)
GLUCOSE-CAPILLARY: 346 mg/dL — AB (ref 65–99)

## 2018-01-21 LAB — CBC WITH DIFFERENTIAL/PLATELET
BASOS ABS: 0.1 10*3/uL (ref 0.0–0.1)
Basophils Relative: 1 %
EOS PCT: 2 %
Eosinophils Absolute: 0.1 10*3/uL (ref 0.0–0.7)
HEMATOCRIT: 38.2 % (ref 36.0–46.0)
Hemoglobin: 13 g/dL (ref 12.0–15.0)
Lymphocytes Relative: 40 %
Lymphs Abs: 2.4 10*3/uL (ref 0.7–4.0)
MCH: 30.4 pg (ref 26.0–34.0)
MCHC: 34 g/dL (ref 30.0–36.0)
MCV: 89.5 fL (ref 78.0–100.0)
MONO ABS: 0.4 10*3/uL (ref 0.1–1.0)
MONOS PCT: 7 %
NEUTROS ABS: 3.1 10*3/uL (ref 1.7–7.7)
Neutrophils Relative %: 50 %
PLATELETS: 220 10*3/uL (ref 150–400)
RBC: 4.27 MIL/uL (ref 3.87–5.11)
RDW: 12.9 % (ref 11.5–15.5)
WBC: 6 10*3/uL (ref 4.0–10.5)

## 2018-01-21 LAB — POC URINE PREG, ED: Preg Test, Ur: NEGATIVE

## 2018-01-21 LAB — LIPASE, BLOOD: Lipase: 19 U/L (ref 11–51)

## 2018-01-21 MED ORDER — ONDANSETRON HCL 4 MG/2ML IJ SOLN
4.0000 mg | Freq: Once | INTRAMUSCULAR | Status: AC
  Administered 2018-01-21: 4 mg via INTRAVENOUS
  Filled 2018-01-21: qty 2

## 2018-01-21 MED ORDER — PROMETHAZINE HCL 25 MG PO TABS: 25.0000 mg | ORAL_TABLET | Freq: Four times a day (QID) | ORAL | 0 refills | Status: DC | PRN

## 2018-01-21 MED ORDER — PROMETHAZINE HCL 25 MG/ML IJ SOLN
25.0000 mg | Freq: Once | INTRAMUSCULAR | Status: AC
  Administered 2018-01-21: 25 mg via INTRAMUSCULAR
  Filled 2018-01-21: qty 1

## 2018-01-21 MED ORDER — FENTANYL CITRATE (PF) 100 MCG/2ML IJ SOLN
50.0000 ug | Freq: Once | INTRAMUSCULAR | Status: AC
  Administered 2018-01-21: 50 ug via INTRAVENOUS
  Filled 2018-01-21: qty 2

## 2018-01-21 MED ORDER — SODIUM CHLORIDE 0.9 % IV BOLUS
1000.0000 mL | Freq: Once | INTRAVENOUS | Status: AC
  Administered 2018-01-21: 1000 mL via INTRAVENOUS

## 2018-01-21 MED ORDER — DICYCLOMINE HCL 20 MG PO TABS: 20.0000 mg | ORAL_TABLET | Freq: Two times a day (BID) | ORAL | 0 refills | Status: DC

## 2018-01-21 NOTE — ED Notes (Signed)
Patient unable to provide a stool sample at this time

## 2018-01-21 NOTE — ED Notes (Signed)
Patient unable to provide a stool sample with second attempt.

## 2018-01-21 NOTE — ED Triage Notes (Signed)
Pt reports recent abd issues with an admission.  States yesterday began having diarrhea around noon with dark/redness at times.  Unsure if bleeding.

## 2018-01-21 NOTE — Discharge Instructions (Addendum)
Please collect stool in specimen cup and bring it to your primary care provider for testing. Please follow-up with your primary care provider concerning your hyperglycemia.  Contact a doctor if: You have a fever. Your diarrhea gets worse. You have new symptoms. You cannot keep fluids down. You feel light-headed or dizzy. You have a headache. You have muscle cramps. Get help right away if: You have chest pain. You feel very weak or you pass out (faint). You have bloody or black poop or poop that look like tar. You have very bad pain, cramping, or bloating in your belly (abdomen). You have trouble breathing or you are breathing very quickly. Your heart is beating very quickly. Your skin feels cold and clammy. You feel confused. You have signs of dehydration, such as: Dark pee, hardly any pee, or no pee. Cracked lips. Dry mouth. Sunken eyes. Sleepiness. Weakness. Contact a health care provider if: Your blood glucose is at or above 240 mg/dL (13.3 mmol/L) for 2 days in a row. You have problems keeping your blood glucose in your target range. You have frequent episodes of hyperglycemia. Get help right away if: You have difficulty breathing. You have a change in how you think, feel, or act (mental status). You have nausea or vomiting that does not go away.

## 2018-01-21 NOTE — ED Provider Notes (Signed)
The patient is a 47 year old female, she has recently had an extensive work-up for abdominal pain including colonoscopy, CT scan of the abdomen, endoscopy, presents today with diarrhea again, she has some abdominal discomfort with it, she is unsure why she is feeling this way, on my exam she has an abdomen which is essentially nontender with regards to focality however she has mild diffuse tenderness without guarding.  No peritoneal signs.  Lung sounds and heart sounds are normal without tachycardia, no edema.  Mucous memories are moist, patient is well-appearing.  I think that the patient needs to have evaluation for C. difficile as she has recently had some exposure to the medical environment and may have contracted this.  Samples ordered, otherwise patient is nonsurgical, does not appear to be acutely ill and I anticipate discharge home.  Medical screening examination/treatment/procedure(s) were conducted as a shared visit with non-physician practitioner(s) and myself.  I personally evaluated the patient during the encounter.  Clinical Impression:   Final diagnoses:  Diarrhea in adult patient  Hyperglycemia         Noemi Chapel, MD 01/22/18 [phone]

## 2018-01-21 NOTE — ED Provider Notes (Signed)
[facility] EMERGENCY DEPARTMENT Provider Note   CSN: [id number] Arrival date & time: 01/21/18  1832     History   Chief Complaint Chief Complaint  Patient presents with  . Diarrhea    HPI [name] is a 47 y.o. female presenting for of diarrhea and abdominal pain.  Patient states that her diarrhea began yesterday around lunchtime and has not stopped since.  Patient states that her stool is watery, dark, and occasionally blood-tinged.  Patient describes abdominal pain as a constant 8/10 cramping pain in her mid abdomen that has been constant since the diarrhea began.  Patient also endorses nausea but denies vomiting. Of note patient was admitted to this facility on June 7 for hyperglycemia.  At that time she had been experiencing approximately 7 weeks of constipation.  Per patient she states that she had not had a bowel movement prior to admission, patient states that during her admission she was giving a bowel prep and underwent endoscopy and colonoscopy.  Chart reviewed and results of colonoscopy were inconclusive.  So underwent CT scan on June 7 no acute abnormality noted however there was prominent stool throughout colon. Patient states for the last 2 weeks since discharge she returned to having constipation, endorses small hard stools every other day until yesterday at lunchtime when diarrhea began. Patient states that her normal blue blood glucose readings are around 300-400 normally.   HPI  Past Medical History:  Diagnosis Date  . Abdominal wall mass of right flank 04/11/13  . Abrasion of leg with infection 02/18/14  . Anxiety   . Benign paroxysmal vertigo 09/04/13  . Bronchitis, acute 02/28/14  . Bunion of great toe 04/11/13  . Cervicalgia 06/13/14  . Depression   . Diabetes ([place]) 11/08/2017  . Diabetes mellitus without complication ([place])   . Dorsalgia 05/17/14  . Dysuria 03/27/14  . Gastro-esophageal reflux disease without esophagitis 01/28/14  . Gastroenteritis 01/28/14   . GERD (gastroesophageal reflux disease)   . Hiatal hernia   . Hyperlipidemia   . Hypertension   . IBS (irritable bowel syndrome)   . Migraine headache   . Neuropathy   . Pain in thoracic spine 06/13/14  . 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 PE (physical exam), annual   . Peripheral neuropathy 11/05/13  . Subungual hematoma of digit of hand   . Tendonitis 03/07/13  . TIA (transient ischemic attack)   . Tobacco use   . Vertigo     Patient Active Problem List   Diagnosis Date Noted  . Uncontrolled diabetes mellitus ([place]) 01/06/2018  . Depression 01/06/2018  . Family hx of colon cancer 11/10/2017  . Diabetes ([place]) 11/08/2017  . Chest pain 03/25/2015  . Hypertension   . Hyperlipidemia   . Anxiety   . Pain in the chest   . Essential hypertension   . 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 TIA (transient ischemic attack) 08/17/2014  . Tobacco abuse   . Gastro-esophageal reflux disease without esophagitis 01/28/2014  . Peripheral neuropathy 11/05/2013    Past Surgical History:  Procedure Laterality Date  . ABDOMINAL HYSTERECTOMY    . APPENDECTOMY    . CARDIAC CATHETERIZATION   last in 2009   x 4, normal coronary arteries  . CARDIAC CATHETERIZATION N/A 03/26/2015   Procedure: Left Heart Cath and Coronary Angiography;  Surgeon: Wellington Hampshire, MD;  Location: [place] [facility];  Service: Cardiovascular;  Laterality: N/A;  . CESAREAN SECTION    .  CHOLECYSTECTOMY    . COLONOSCOPY WITH PROPOFOL N/A 01/09/2018   Procedure: COLONOSCOPY WITH PROPOFOL;  Surgeon: Rogene Houston, MD;  [facility];  Service: Endoscopy;  Laterality: N/A;  . CYST EXCISION     right breast  . ESOPHAGOGASTRODUODENOSCOPY (EGD) WITH PROPOFOL N/A 01/09/2018   Procedure: ESOPHAGOGASTRODUODENOSCOPY (EGD) WITH PROPOFOL;  Surgeon: Rogene Houston, MD;  Location: [facility];  Service: Endoscopy;  Laterality: N/A;  . TUBAL LIGATION        OB History   None      Home Medications    Prior to Admission medications   Medication Sig Start Date End Date Taking? Authorizing Provider  cyclobenzaprine (FLEXERIL) 10 MG tablet Take 10 mg by mouth daily as needed for muscle spasms.  01/12/17  Yes [provider]  diazepam (VALIUM) 10 MG tablet Take 1 tablet by mouth 4 (four) times daily - after meals and at bedtime. 12/27/17  Yes [provider]  docusate sodium (COLACE) 100 MG capsule Take 100 mg by mouth 2 (two) times daily.   Yes [provider]  EPIPEN 2-PAK 0.3 MG/0.3ML SOAJ injection Inject 0.3 mg into the muscle once.  02/13/16  Yes [provider]  estradiol (ESTRACE) 1 MG tablet Take 1 mg by mouth daily.   Yes [provider]  Insulin Glargine (LANTUS SOLOSTAR) 100 UNIT/ML Solostar Pen Inject 24 Units into the skin daily before breakfast. 04/08/15  Yes Philemon Kingdom, MD  insulin lispro (HUMALOG KWIKPEN) 100 UNIT/ML KiwkPen Inject 10 Units into the skin 3 (three) times daily with meals. + SLIDING SCALE   Yes [provider]  lisinopril (PRINIVIL,ZESTRIL) 5 MG tablet Take 5 mg by mouth daily. 06/10/15  Yes [provider]  lubiprostone (AMITIZA) 24 MCG capsule Take 1 capsule (24 mcg total) by mouth 2 (two) times daily with a meal. 01/09/18 02/08/18 Yes Shah, Pratik D, DO  LYRICA 150 MG capsule Take 150 mg by mouth 2 (two) times daily.  08/19/15  Yes [provider]  Melatonin 10 MG CAPS Take 10 mg by mouth at bedtime.    Yes [provider]  Omega-3 Fatty Acids (FISH OIL) 1200 MG CPDR Take 1 capsule by mouth daily.    Yes [provider]  omeprazole (PRILOSEC) 40 MG capsule Take 40 mg by mouth daily as needed (for acid reflux).    Yes [provider]  pravastatin (PRAVACHOL) 10 MG tablet Take 10 mg by mouth at bedtime. 05/29/15  Yes [provider]  buPROPion (WELLBUTRIN) 75 MG tablet Take 1 tablet (75 mg total) by mouth  every morning. Patient not taking: Reported on 01/21/2018 01/09/18 02/08/18  Heath Lark D, DO  dicyclomine (BENTYL) 20 MG tablet Take 1 tablet (20 mg total) by mouth 2 (two) times daily. 01/21/18   Nuala Alpha A, PA-C  nicotine (NICODERM CQ - DOSED IN MG/24 HOURS) 14 mg/24hr patch Place 1 patch (14 mg total) onto the skin daily as needed (Nicotine withdrawal symptoms.). Patient not taking: Reported on 01/21/2018 01/09/18   Heath Lark D, DO  promethazine (PHENERGAN) 25 MG tablet Take 1 tablet (25 mg total) by mouth every 6 (six) hours as needed for nausea or vomiting. 01/21/18   Nuala Alpha A, PA-C  sodium phosphate (FLEET) 7-19 GM/118ML ENEM Place 133 mLs (1 enema total) rectally every morning. Patient not taking: Reported on 01/21/2018 01/09/18 02/08/18  Heath Lark D, DO    Family History Family History  Problem Relation Age of Onset  .  Stroke Mother 73  . Heart attack Mother 15  . Cancer Sister        colon  . Heart failure Maternal Grandmother 65  . Cancer Maternal Grandfather        prostate  . Heart failure Paternal Grandmother 77  . Heart failure Paternal Grandfather 17    Social History Social History   Tobacco Use  . Smoking status: Current Every Day Smoker    Packs/day: 0.50    Years: 11.00    Pack years: 5.50    Types: Cigarettes  . Smokeless tobacco: Never Used  Substance Use Topics  . Alcohol use: Yes    Alcohol/week: 0.0 oz    Comment: occasionally  . Drug use: No     Allergies   Zocor [simvastatin]; Clindamycin/lincomycin; Codeine; Penicillins; and Sulfa antibiotics   Review of Systems Review of Systems  Constitutional: Negative.  Negative for chills, fatigue, fever and unexpected weight change.  HENT: Negative.  Negative for congestion, ear pain, rhinorrhea, sore throat and trouble swallowing.   Eyes: Negative.  Negative for visual disturbance.  Respiratory: Negative.  Negative for cough, chest tightness and shortness of breath.    Cardiovascular: Negative.  Negative for chest pain and leg swelling.  Gastrointestinal: Positive for abdominal pain, blood in stool, constipation, diarrhea and nausea. Negative for abdominal distention and vomiting.  Endocrine: Negative for polydipsia, polyphagia and polyuria.  Genitourinary: Negative.  Negative for difficulty urinating, dysuria, flank pain, hematuria, pelvic pain, vaginal bleeding and vaginal discharge.  Musculoskeletal: Negative.  Negative for arthralgias, myalgias and neck pain.  Skin: Negative.  Negative for rash.  Allergic/Immunologic: Negative for environmental allergies and food allergies.  Neurological: Negative.  Negative for dizziness, syncope, weakness, light-headedness and headaches.     Physical Exam Updated Vital Signs BP 120/78   Pulse 75   Temp 100.1 F (37.8 C) (Oral)   Resp 16   Ht 5\' 7"  (7.829 m)   Wt 80.3 kg (177 lb)   SpO2 95%   BMI 27.72 kg/m   Physical Exam  Constitutional: She is oriented to person, place, and time. She appears well-developed and well-nourished. No distress.  HENT:  Head: Normocephalic and atraumatic.  Mouth/Throat: Uvula is midline, oropharynx is clear and moist and mucous membranes are normal. Dental caries present. No uvula swelling. No oropharyngeal exudate, posterior oropharyngeal edema, posterior oropharyngeal erythema or tonsillar abscesses.    Neck: Normal range of motion. Neck supple. No JVD present. No tracheal deviation present.  Cardiovascular: Normal rate, regular rhythm, normal heart sounds and intact distal pulses. Exam reveals no gallop and no friction rub.  No murmur heard. Pulmonary/Chest: Effort normal and breath sounds normal. No stridor. No respiratory distress. She has no wheezes. She has no rales.  Abdominal: Soft. Normal appearance. She exhibits no distension. Bowel sounds are increased. There is tenderness in the epigastric area. There is no rebound, no guarding, no tenderness at McBurney's point  and negative Murphy's sign.  Musculoskeletal: Normal range of motion. She exhibits no edema or deformity.  Lymphadenopathy:    She has no cervical adenopathy.  Neurological: She is alert and oriented to person, place, and time.  Skin: Skin is warm and dry. Capillary refill takes less than 2 seconds. She is not diaphoretic.  Psychiatric: She has a normal mood and affect. Her behavior is normal.     ED Treatments / Results  Labs (all labs ordered are listed, but only abnormal results are displayed) Labs Reviewed  COMPREHENSIVE METABOLIC PANEL - Abnormal;  Notable for the following components:      Result Value   Sodium 133 (*)    Glucose, Bld 467 (*)    Calcium 8.7 (*)    Total Protein 6.1 (*)    Albumin 3.4 (*)    All other components within normal limits  URINALYSIS, ROUTINE W REFLEX MICROSCOPIC - Abnormal; Notable for the following components:   Color, Urine STRAW (*)    Glucose, UA >=500 (*)    All other components within normal limits  CBG MONITORING, ED - Abnormal; Notable for the following components:   Glucose-Capillary 411 (*)    All other components within normal limits  CBG MONITORING, ED - Abnormal; Notable for the following components:   Glucose-Capillary 346 (*)    All other components within normal limits  C DIFFICILE QUICK SCREEN W PCR REFLEX  CBC WITH DIFFERENTIAL/PLATELET  LIPASE, BLOOD  POC URINE PREG, ED    EKG None  Radiology No results found.  Procedures Procedures (including critical care time)  Medications Ordered in ED Medications  sodium chloride 0.9 % bolus 1,000 mL (0 mLs Intravenous Stopped 01/21/18 2200)  ondansetron (ZOFRAN) injection 4 mg (4 mg Intravenous Given 01/21/18 2124)  fentaNYL (SUBLIMAZE) injection 50 mcg (50 mcg Intravenous Given 01/21/18 2123)  promethazine (PHENERGAN) injection 25 mg (25 mg Intramuscular Given 01/21/18 2158)     Initial Impression / Assessment and Plan / ED Course  I have reviewed the triage vital signs  and the nursing notes.  Pertinent labs & imaging results that were available during my care of the patient were reviewed by me and considered in my medical decision making (see chart for details).    Patient presenting for abdominal cramping and diarrhea. Patient with recent extensive medical workup including CT abdomen and colonoscopy for multiple weeks of constipation. Patient without surgical abdomen, no signs of peritonitis. Lab work today did not show concern for infection. Patient's blood glucose noted to be elevated today. Patient states that her blood glucose normally runs in the 300-400 range. No signs of DKA today. Patient informed of need to improve management of blood glucose levels. Patient unable to provide stool sample today for C-Diff test. Patient informed of her increased risk of C-Diff due to her recent hospitalization. Patient instructed to bring specimen to her PCP for testing. Patient's nausea and pain were managed in department today. Patient states she is ready and feels comfortable with discharge. Patient given extensive return precautions. Patient and family state understanding of return precautions. Patient and family state understanding and are agreeable with plan to discharge.  Patient seen by Dr. Sabra Heck who agrees with plan to discharge with follow-up.  Final Clinical Impressions(s) / ED Diagnoses   Final diagnoses:  Diarrhea in adult patient  Hyperglycemia    ED Discharge Orders        Ordered    dicyclomine (BENTYL) 20 MG tablet  2 times daily     01/21/18 2209    promethazine (PHENERGAN) 25 MG tablet  Every 6 hours PRN     01/21/18 2209       Gari Crown 01/22/18 Leandrew Koyanagi    Noemi Chapel, MD 01/22/18 [phone]

## 2018-01-30 DIAGNOSIS — R69 Illness, unspecified: Secondary | ICD-10-CM | POA: Diagnosis not present

## 2018-02-01 ENCOUNTER — Encounter (INDEPENDENT_AMBULATORY_CARE_PROVIDER_SITE_OTHER): Payer: Self-pay | Admitting: *Deleted

## 2018-02-16 ENCOUNTER — Encounter (HOSPITAL_COMMUNITY)
Admission: RE | Disposition: A | Discharge status: Home / Self Care | Payer: Self-pay | Source: Ambulatory Visit | Attending: Oral Surgery

## 2018-02-16 ENCOUNTER — Ambulatory Visit (HOSPITAL_COMMUNITY)
Admission: RE | Admit: 2018-02-16 | Discharge: 2018-02-16 | Disposition: A | Discharge status: Home / Self Care | Payer: Medicare HMO | Source: Ambulatory Visit | Attending: Oral Surgery | Admitting: Oral Surgery

## 2018-02-16 ENCOUNTER — Ambulatory Visit (HOSPITAL_COMMUNITY): Payer: Medicare HMO | Admitting: Certified Registered Nurse Anesthetist

## 2018-02-16 ENCOUNTER — Encounter (HOSPITAL_COMMUNITY): Payer: Self-pay

## 2018-02-16 ENCOUNTER — Other Ambulatory Visit: Payer: Self-pay

## 2018-02-16 DIAGNOSIS — F329 Major depressive disorder, single episode, unspecified: Secondary | ICD-10-CM | POA: Insufficient documentation

## 2018-02-16 DIAGNOSIS — Z8673 Personal history of transient ischemic attack (TIA), and cerebral infarction without residual deficits: Secondary | ICD-10-CM | POA: Insufficient documentation

## 2018-02-16 DIAGNOSIS — F17200 Nicotine dependence, unspecified, uncomplicated: Secondary | ICD-10-CM | POA: Insufficient documentation

## 2018-02-16 DIAGNOSIS — Z794 Long term (current) use of insulin: Secondary | ICD-10-CM | POA: Diagnosis not present

## 2018-02-16 DIAGNOSIS — Z79899 Other long term (current) drug therapy: Secondary | ICD-10-CM | POA: Diagnosis not present

## 2018-02-16 DIAGNOSIS — E785 Hyperlipidemia, unspecified: Secondary | ICD-10-CM | POA: Diagnosis not present

## 2018-02-16 DIAGNOSIS — I10 Essential (primary) hypertension: Secondary | ICD-10-CM | POA: Insufficient documentation

## 2018-02-16 DIAGNOSIS — J320 Chronic maxillary sinusitis: Secondary | ICD-10-CM | POA: Diagnosis not present

## 2018-02-16 DIAGNOSIS — E1142 Type 2 diabetes mellitus with diabetic polyneuropathy: Secondary | ICD-10-CM | POA: Insufficient documentation

## 2018-02-16 DIAGNOSIS — K219 Gastro-esophageal reflux disease without esophagitis: Secondary | ICD-10-CM | POA: Insufficient documentation

## 2018-02-16 DIAGNOSIS — F419 Anxiety disorder, unspecified: Secondary | ICD-10-CM | POA: Insufficient documentation

## 2018-02-16 HISTORY — PX: TOOTH EXTRACTION: SHX859

## 2018-02-16 LAB — CBC
HCT: 42.7 % (ref 36.0–46.0)
HEMOGLOBIN: 14.4 g/dL (ref 12.0–15.0)
MCH: 29.8 pg (ref 26.0–34.0)
MCHC: 33.7 g/dL (ref 30.0–36.0)
MCV: 88.4 fL (ref 78.0–100.0)
Platelets: 250 10*3/uL (ref 150–400)
RBC: 4.83 MIL/uL (ref 3.87–5.11)
RDW: 12 % (ref 11.5–15.5)
WBC: 7.6 10*3/uL (ref 4.0–10.5)

## 2018-02-16 LAB — BASIC METABOLIC PANEL
Anion gap: 11 (ref 5–15)
BUN: 6 mg/dL (ref 6–20)
CHLORIDE: 100 mmol/L (ref 98–111)
CO2: 27 mmol/L (ref 22–32)
Calcium: 9.5 mg/dL (ref 8.9–10.3)
Creatinine, Ser: 0.66 mg/dL (ref 0.44–1.00)
GFR calc Af Amer: >60 mL/min (ref >60)
GFR calc non Af Amer: >60 mL/min (ref >60)
GLUCOSE: 284 mg/dL — AB (ref 70–99)
POTASSIUM: 4.2 mmol/L (ref 3.5–5.1)
SODIUM: 138 mmol/L (ref 135–145)

## 2018-02-16 LAB — GLUCOSE, CAPILLARY
GLUCOSE-CAPILLARY: 295 mg/dL — AB (ref 70–99)
GLUCOSE-CAPILLARY: 246 mg/dL — AB (ref 70–99)
GLUCOSE-CAPILLARY: 266 mg/dL — AB (ref 70–99)

## 2018-02-16 SURGERY — DENTAL RESTORATION/EXTRACTIONS
Anesthesia: General | Laterality: Bilateral

## 2018-02-16 MED ORDER — MIDAZOLAM HCL 2 MG/2ML IJ SOLN
INTRAMUSCULAR | Status: AC
  Filled 2018-02-16: qty 2

## 2018-02-16 MED ORDER — OXYMETAZOLINE HCL 0.05 % NA SOLN
NASAL | Status: AC
  Filled 2018-02-16: qty 15

## 2018-02-16 MED ORDER — FENTANYL CITRATE (PF) 250 MCG/5ML IJ SOLN
INTRAMUSCULAR | Status: AC
  Filled 2018-02-16: qty 5

## 2018-02-16 MED ORDER — CLINDAMYCIN HCL 300 MG PO CAPS: 300.0000 mg | ORAL_CAPSULE | Freq: Three times a day (TID) | ORAL | 0 refills | Status: DC

## 2018-02-16 MED ORDER — ONDANSETRON HCL 4 MG/2ML IJ SOLN
INTRAMUSCULAR | Status: DC | PRN
  Administered 2018-02-16: 4 mg via INTRAVENOUS

## 2018-02-16 MED ORDER — LORATADINE-PSEUDOEPHEDRINE ER 10-240 MG PO TB24: 1.0000 | ORAL_TABLET | Freq: Every day | ORAL | 0 refills | Status: DC

## 2018-02-16 MED ORDER — OXYMETAZOLINE HCL 0.05 % NA SOLN
NASAL | Status: DC | PRN
  Administered 2018-02-16 (×2): 2 via NASAL

## 2018-02-16 MED ORDER — 0.9 % SODIUM CHLORIDE (POUR BTL) OPTIME
TOPICAL | Status: DC | PRN
  Administered 2018-02-16: 1000 mL

## 2018-02-16 MED ORDER — INSULIN ASPART 100 UNIT/ML ~~LOC~~ SOLN
5.0000 [IU] | Freq: Once | SUBCUTANEOUS | Status: AC
  Administered 2018-02-16: 5 [IU] via SUBCUTANEOUS

## 2018-02-16 MED ORDER — SUCCINYLCHOLINE CHLORIDE 20 MG/ML IJ SOLN
INTRAMUSCULAR | Status: DC | PRN
  Administered 2018-02-16: 100 mg via INTRAVENOUS

## 2018-02-16 MED ORDER — FENTANYL CITRATE (PF) 100 MCG/2ML IJ SOLN
INTRAMUSCULAR | Status: AC
  Filled 2018-02-16: qty 2

## 2018-02-16 MED ORDER — PROPOFOL 10 MG/ML IV BOLUS
INTRAVENOUS | Status: DC | PRN
  Administered 2018-02-16: 150 mg via INTRAVENOUS
  Administered 2018-02-16: 50 mg via INTRAVENOUS

## 2018-02-16 MED ORDER — LACTATED RINGERS IV SOLN
INTRAVENOUS | Status: DC | PRN
  Administered 2018-02-16: 15:00:00 via INTRAVENOUS

## 2018-02-16 MED ORDER — LIDOCAINE-EPINEPHRINE 2 %-1:100000 IJ SOLN
INTRAMUSCULAR | Status: DC | PRN
  Administered 2018-02-16: 10 mL via INTRADERMAL

## 2018-02-16 MED ORDER — LIDOCAINE-EPINEPHRINE 2 %-1:100000 IJ SOLN
INTRAMUSCULAR | Status: AC
  Filled 2018-02-16: qty 1

## 2018-02-16 MED ORDER — SODIUM CHLORIDE 0.9 % IV SOLN
INTRAVENOUS | Status: DC | PRN
  Administered 2018-02-16: 30 ug/min via INTRAVENOUS

## 2018-02-16 MED ORDER — METRONIDAZOLE IN NACL 5-0.79 MG/ML-% IV SOLN
500.0000 mg | INTRAVENOUS | Status: AC
  Administered 2018-02-16: 500 mg via INTRAVENOUS
  Filled 2018-02-16: qty 100

## 2018-02-16 MED ORDER — GLYCOPYRROLATE 0.2 MG/ML IJ SOLN
INTRAMUSCULAR | Status: DC | PRN
  Administered 2018-02-16: 0.2 mg via INTRAVENOUS

## 2018-02-16 MED ORDER — PROPOFOL 10 MG/ML IV BOLUS
INTRAVENOUS | Status: AC
  Filled 2018-02-16: qty 20

## 2018-02-16 MED ORDER — FENTANYL CITRATE (PF) 100 MCG/2ML IJ SOLN
INTRAMUSCULAR | Status: DC | PRN
  Administered 2018-02-16: 100 ug via INTRAVENOUS

## 2018-02-16 MED ORDER — MIDAZOLAM HCL 5 MG/5ML IJ SOLN
INTRAMUSCULAR | Status: DC | PRN
  Administered 2018-02-16: 2 mg via INTRAVENOUS

## 2018-02-16 MED ORDER — FENTANYL CITRATE (PF) 100 MCG/2ML IJ SOLN
25.0000 ug | INTRAMUSCULAR | Status: DC | PRN
  Administered 2018-02-16: 50 ug via INTRAVENOUS

## 2018-02-16 MED ORDER — OXYCODONE-ACETAMINOPHEN 5-325 MG PO TABS: 1.0000 | ORAL_TABLET | ORAL | 0 refills | Status: DC | PRN

## 2018-02-16 MED ORDER — ACETAMINOPHEN 10 MG/ML IV SOLN
INTRAVENOUS | Status: AC
  Filled 2018-02-16: qty 100

## 2018-02-16 MED ORDER — PROMETHAZINE HCL 25 MG/ML IJ SOLN: 6.2500 mg | INTRAMUSCULAR | Status: DC | PRN

## 2018-02-16 MED ORDER — SODIUM CHLORIDE 0.9 % IR SOLN
Status: DC | PRN
  Administered 2018-02-16: 1000 mL

## 2018-02-16 SURGICAL SUPPLY — 32 items
BLADE SURG 15 STRL LF DISP TIS (BLADE) ×1 IMPLANT
BLADE SURG 15 STRL SS (BLADE) ×1
BUR CROSS CUT FISSURE 1.6 (BURR) ×2 IMPLANT
BUR EGG ELITE 4.0 (BURR) ×2 IMPLANT
CANISTER SUCT 3000ML PPV (MISCELLANEOUS) ×2 IMPLANT
COVER SURGICAL LIGHT HANDLE (MISCELLANEOUS) ×2 IMPLANT
DECANTER SPIKE VIAL GLASS SM (MISCELLANEOUS) ×2 IMPLANT
GAUZE PACKING FOLDED 2  STR (GAUZE/BANDAGES/DRESSINGS) ×1
GAUZE PACKING FOLDED 2 STR (GAUZE/BANDAGES/DRESSINGS) ×1 IMPLANT
GLOVE BIO SURGEON STRL SZ 6.5 (GLOVE) IMPLANT
GLOVE BIO SURGEON STRL SZ7 (GLOVE) IMPLANT
GLOVE BIO SURGEON STRL SZ7.5 (GLOVE) ×2 IMPLANT
GLOVE BIOGEL PI IND STRL 6.5 (GLOVE) IMPLANT
GLOVE BIOGEL PI IND STRL 7.0 (GLOVE) IMPLANT
GLOVE BIOGEL PI INDICATOR 6.5 (GLOVE)
GLOVE BIOGEL PI INDICATOR 7.0 (GLOVE)
GOWN STRL REUS W/ TWL LRG LVL3 (GOWN DISPOSABLE) ×1 IMPLANT
GOWN STRL REUS W/ TWL XL LVL3 (GOWN DISPOSABLE) ×1 IMPLANT
GOWN STRL REUS W/TWL LRG LVL3 (GOWN DISPOSABLE) ×1
GOWN STRL REUS W/TWL XL LVL3 (GOWN DISPOSABLE) ×1
KIT BASIN OR (CUSTOM PROCEDURE TRAY) ×2 IMPLANT
KIT TURNOVER KIT B (KITS) ×2 IMPLANT
NEEDLE 22X1 1/2 (OR ONLY) (NEEDLE) ×4 IMPLANT
NS IRRIG 1000ML POUR BTL (IV SOLUTION) ×2 IMPLANT
PAD ARMBOARD 7.5X6 YLW CONV (MISCELLANEOUS) ×2 IMPLANT
SPONGE SURGIFOAM ABS GEL 12-7 (HEMOSTASIS) IMPLANT
SUT CHROMIC 3 0 PS 2 (SUTURE) ×2 IMPLANT
SUT GORETEX CV4 TH-18 (SUTURE) ×2 IMPLANT
SYR CONTROL 10ML LL (SYRINGE) ×2 IMPLANT
TRAY ENT MC OR (CUSTOM PROCEDURE TRAY) ×2 IMPLANT
TUBING IRRIGATION (MISCELLANEOUS) ×2 IMPLANT
YANKAUER SUCT BULB TIP NO VENT (SUCTIONS) ×2 IMPLANT

## 2018-02-16 NOTE — H&P (Signed)
HISTORY AND PHYSICAL  [name] is a 47 y.o. female patient with CC: right sinus drainage with pain.  HPI: Patient underwent removal tooth #3 on 01/30/2018. Developed post-operative pain. Treated for dry socket. 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  No diagnosis found.  Past Medical History:  Diagnosis Date  . Abdominal wall mass of right flank 04/11/13  . Abrasion of leg with infection 02/18/14  . Anxiety   . Benign paroxysmal vertigo 09/04/13  . Bronchitis, acute 02/28/14  . Bunion of great toe 04/11/13  . Cervicalgia 06/13/14  . Depression   . Diabetes ([place]) 11/08/2017  . Diabetes mellitus without complication ([place])   . Dorsalgia 05/17/14  . Dysuria 03/27/14  . Gastro-esophageal reflux disease without esophagitis 01/28/14  . Gastroenteritis 01/28/14  . GERD (gastroesophageal reflux disease)   . Hiatal hernia   . Hyperlipidemia   . Hypertension   . IBS (irritable bowel syndrome)   . Migraine headache   . Neuropathy   . Pain in thoracic spine 06/13/14  . 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 PE (physical exam), annual   . Peripheral neuropathy 11/05/13  . Subungual hematoma of digit of hand   . Tendonitis 03/07/13  . TIA (transient ischemic attack)   . Tobacco use   . Vertigo     Current Facility-Administered Medications  Medication Dose Route Frequency Provider Last Rate Last Dose  . betamethasone acetate-betamethasone sodium phosphate (CELESTONE) injection 3 mg  3 mg Intramuscular Once Edrick Kins, DPM       Current Outpatient Medications  Medication Sig Dispense Refill  . buPROPion (WELLBUTRIN) 75 MG tablet Take 1 tablet (75 mg total) by mouth every morning. (Patient not taking: Reported on 01/21/2018) 30 tablet 3  . cyclobenzaprine (FLEXERIL) 10 MG tablet Take 10 mg by mouth daily as needed for muscle spasms.   0  . diazepam (VALIUM) 10 MG tablet Take 1 tablet by mouth 4 (four) times daily - after meals and at bedtime.    . dicyclomine (BENTYL) 20 MG  tablet Take 1 tablet (20 mg total) by mouth 2 (two) times daily. 20 tablet 0  . docusate sodium (COLACE) 100 MG capsule Take 100 mg by mouth 2 (two) times daily.    Marland Kitchen EPIPEN 2-PAK 0.3 MG/0.3ML SOAJ injection Inject 0.3 mg into the muscle once.     Marland Kitchen estradiol (ESTRACE) 1 MG tablet Take 1 mg by mouth daily.    . Insulin Glargine (LANTUS SOLOSTAR) 100 UNIT/ML Solostar Pen Inject 24 Units into the skin daily before breakfast. 5 pen 2  . insulin lispro (HUMALOG KWIKPEN) 100 UNIT/ML KiwkPen Inject 10 Units into the skin 3 (three) times daily with meals. + SLIDING SCALE    . lisinopril (PRINIVIL,ZESTRIL) 5 MG tablet Take 5 mg by mouth daily.  2  . LYRICA 150 MG capsule Take 150 mg by mouth 2 (two) times daily.     . Melatonin 10 MG CAPS Take 10 mg by mouth at bedtime.     . nicotine (NICODERM CQ - DOSED IN MG/24 HOURS) 14 mg/24hr patch Place 1 patch (14 mg total) onto the skin daily as needed (Nicotine withdrawal symptoms.). (Patient not taking: Reported on 01/21/2018) 28 patch 0  . Omega-3 Fatty Acids (FISH OIL) 1200 MG CPDR Take 1 capsule by mouth daily.     Marland Kitchen omeprazole (PRILOSEC) 40 MG capsule Take 40 mg by mouth daily as needed (for acid reflux).     . pravastatin (PRAVACHOL) 10 MG  tablet Take 10 mg by mouth at bedtime.  2  . promethazine (PHENERGAN) 25 MG tablet Take 1 tablet (25 mg total) by mouth every 6 (six) hours as needed for nausea or vomiting. 20 tablet 0   Allergies  Allergen Reactions  . Zocor [Simvastatin] Other (See Comments)    Muscle aches and pains  . Clindamycin/Lincomycin Rash  . Codeine Rash  . Penicillins Rash    Has patient had a PCN reaction causing immediate rash, facial/tongue/throat swelling, SOB or lightheadedness with hypotension: Yes Has patient had a PCN reaction causing severe rash involving mucus membranes or skin necrosis: Yes Has patient had a PCN reaction that required hospitalization: Yes Has patient had a PCN reaction occurring within the last 10 years:  No If all of the above answers are "NO", then may proceed with Cephalosporin use.   . Sulfa Antibiotics Rash        Active Problems:   * No active hospital problems. *  Vitals: There were no vitals taken for this visit. Lab results:No results found for this or any previous visit (from the past 29 hour(s)). Radiology Results: No results found. General appearance: alert, cooperative and no distress Head: Normocephalic, without obvious abnormality, atraumatic Eyes: negative Nose: Nares normal. Septum midline. Mucosa normal. No drainage or sinus tenderness. Throat: 16mm x 39mm oral antral fistula in socket tooth #3. No purulence, fluctuance, trismus.  Neck: no adenopathy, supple, symmetrical, trachea midline and thyroid not enlarged, symmetric, no tenderness/mass/nodules Resp: clear to auscultation bilaterally Cardio: regular rate and rhythm, S1, S2 normal, no murmur, click, rub or gallop  Assessment: oral antral fistula right maxilla, right maxillary sinusitis  Plan: closure oral antral fistula right maxilla. GA. Day surgery.   Diona Browner 02/16/2018

## 2018-02-16 NOTE — Op Note (Signed)
NAME: [name], [place] MEDICAL RECORD [id number] ACCOUNT [id number] DATE OF BIRTH:January 12, 1971 FACILITY: [facility] LOCATION: [facility] PHYSICIAN:Talana Slatten M. Zori Benbrook, DDS  OPERATIVE REPORT  DATE OF PROCEDURE:  02/16/2018  PREOPERATIVE DIAGNOSIS:  Right maxillary oral antral fistula, right maxillary sinusitis.  POSTOPERATIVE DIAGNOSIS:  Right maxillary oral antral fistula, right maxillary sinusitis.  PROCEDURE:  Closure of right maxillary oroantral fistula and right maxillary sinus antrostomy.  SURGEON:  Diona Browner, DDS  ANESTHESIA:  General, nasal intubation.  DESCRIPTION OF PROCEDURE:  The patient was taken to the operating room and placed on the table in supine position.  General anesthesia was administered and a nasal endotracheal tube was placed and secured.  The eyes were protected and the patient was  draped for surgery.  A timeout was performed.  The posterior pharynx was suctioned and a throat pack was placed, 2% lidocaine 1:100,000 epinephrine was infiltrated buccally and palatally around tooth #3 and extending upward to the infraorbital nerve area  via buccal sulcus injection of the right maxilla, 10 mL was utilized.  A bite block was placed in the left side of the mouth and a sweetheart retractor was used to retract the tongue.  The oral cavity was then examined.  At tooth #3 there was  approximately a 1 cm opening into the sinus, which had some edematous tissue around the circumference of the opening.  This was removed using the #9 Molt curettes of various sizes.  The floor of the sinus was scraped as well as the medial and lateral  wall and the area around tooth #3.  The sinus was irrigated copiously with normal saline and then vertical incisions were created on the buccal aspect of tooth #3 mesial and distal to the tooth and then carried forward superiorly to create a buccal flap.   Then, the periosteum was reflected further to expose the buccal fat pad.  Using a series of hemostats  and trial hemostat, the buccal fat pad was gently expressed and was preserved in pedicle fashion and fitted across the oral antral fistula at tooth  #3.  It was ligated to the medial wall of the palate at tooth #3 soft tissue with 2 Vicryl sutures horizontal mattress.  Then, the buccal pedicle of gingiva was placed over the fat pad graft and periosteal releasing incisions had previously been made on  the buccal pedicle.  The pedicle was ligated to the palatal mucosa at tooth #3 with 4-0 Gore-Tex sutures, horizontal mattress x3 and then the buccal and mesial areas of the pedicle were tacked to the buccal mucosa adjacent to tooth #2 and tooth #4 with  4-0 Gore-Tex sutures.  Then, the remainder of the vertical releasing incisions were closed with 3-0 chromic.  Then, the oral cavity was irrigated, suctioned and a throat pack was removed.  The patient was left in the care of anesthesia for awakening and  transport to recovery room with plans for discharge home through day surgery.  ESTIMATED BLOOD LOSS:  Minimal.  COMPLICATIONS:  None.  SPECIMENS:  None.  TN/NUANCE  D:02/16/2018 T:02/16/2018 JOB:[id number]

## 2018-02-16 NOTE — Anesthesia Postprocedure Evaluation (Signed)
Anesthesia Post Note  Patient: [name]  Procedure(s) Performed: 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 RIGHT MAXILLARY SINUS ANTROSTOMY (Bilateral )     Patient location during evaluation: PACU Anesthesia Type: General Level of consciousness: awake and alert Pain management: pain level controlled Vital Signs Assessment: post-procedure vital signs reviewed and stable Respiratory status: spontaneous breathing, nonlabored ventilation, respiratory function stable and patient connected to nasal cannula oxygen Cardiovascular status: blood pressure returned to baseline and stable Postop Assessment: no apparent nausea or vomiting Anesthetic complications: no    Last Vitals:  Vitals:   02/16/18 1625 02/16/18 1630  BP:  110/76  Pulse: (!) 102 (!) 101  Resp: 15 13  Temp:    SpO2: 94% 93%    Last Pain:  Vitals:   02/16/18 1615  TempSrc:   PainSc: 6                  Aaryanna Hyden S

## 2018-02-16 NOTE — Op Note (Signed)
02/16/2018  4:03 PM  PATIENT:  [name]  47 y.o. female  PRE-OPERATIVE DIAGNOSIS:  RIGHT MAXILLARY ORAL ANTRAL FISTULA, RIGHT MAXILLARY SINUSITIS  POST-OPERATIVE DIAGNOSIS:  SAME  PROCEDURE:  Procedure(s): 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 RIGHT MAXILLARY SINUS ANTROSTOMY  SURGEON:  Surgeon(s): Diona Browner, DDS  ANESTHESIA:   local and general  EBL:  minimal  DRAINS: none   SPECIMEN:  No Specimen  COUNTS:  YES  PLAN OF CARE: Discharge to home after PACU  PATIENT DISPOSITION:  PACU - hemodynamically stable.   PROCEDURE DETAILS: Dictation # [id number]  Gae Bon, DMD 02/16/2018 4:03 PM

## 2018-02-16 NOTE — Anesthesia Preprocedure Evaluation (Signed)
Anesthesia Evaluation  Patient identified by MRN, date of birth, ID band Patient awake    Reviewed: Allergy & Precautions, NPO status , Patient's Chart, lab work & pertinent test results  Airway Mallampati: II  TM Distance: >3 FB Neck ROM: Full    Dental no notable dental hx.    Pulmonary Current Smoker,    Pulmonary exam normal breath sounds clear to auscultation       Cardiovascular hypertension, Normal cardiovascular exam Rhythm:Regular Rate:Normal     Neuro/Psych Bipolar Disorder TIAnegative neurological ROS     GI/Hepatic Neg liver ROS, GERD  ,  Endo/Other  diabetes, Poorly Controlled  Renal/GU negative Renal ROS  negative genitourinary   Musculoskeletal negative musculoskeletal ROS (+)   Abdominal   Peds negative pediatric ROS (+)  Hematology negative hematology ROS (+)   Anesthesia Other Findings   Reproductive/Obstetrics negative OB ROS                             Anesthesia Physical Anesthesia Plan  ASA: III  Anesthesia Plan: General   Post-op Pain Management:    Induction: Intravenous  PONV Risk Score and Plan: 2 and Ondansetron, Dexamethasone and Treatment may vary due to age or medical condition  Airway Management Planned: Oral ETT  Additional Equipment:   Intra-op Plan:   Post-operative Plan: Extubation in OR  Informed Consent: I have reviewed the patients History and Physical, chart, labs and discussed the procedure including the risks, benefits and alternatives for the proposed anesthesia with the patient or authorized representative who has indicated his/her understanding and acceptance.   Dental advisory given  Plan Discussed with: CRNA and Surgeon  Anesthesia Plan Comments:         Anesthesia Quick Evaluation

## 2018-02-16 NOTE — Anesthesia Procedure Notes (Signed)
Procedure Name: Intubation Date/Time: 02/16/2018 3:31 PM Performed by: Cleda Daub, CRNA Pre-anesthesia Checklist: Patient identified, Emergency Drugs available, Suction available and Patient being monitored Patient Re-evaluated:Patient Re-evaluated prior to induction Oxygen Delivery Method: Circle system utilized Preoxygenation: Pre-oxygenation with 100% oxygen Induction Type: IV induction Ventilation: Mask ventilation without difficulty, Mask ventilation throughout procedure and Nasal airway inserted- appropriate to patient size Laryngoscope Size: Mac and 3 Grade View: Grade I Nasal Tubes: Left, Nasal prep performed, Nasal Rae and Magill forceps- large, utilized Tube size: 7.0 mm Number of attempts: 1 Airway Equipment and Method: Stylet Placement Confirmation: ETT inserted through vocal cords under direct vision,  positive ETCO2 and breath sounds checked- equal and bilateral Tube secured with: Tape Dental Injury: Teeth and Oropharynx as per pre-operative assessment

## 2018-02-16 NOTE — Transfer of Care (Signed)
Immediate Anesthesia Transfer of Care Note  Patient: [name]  Procedure(s) Performed: 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 RIGHT MAXILLARY SINUS ANTROSTOMY (Bilateral )  Patient Location: PACU  Anesthesia Type:General  Level of Consciousness: awake, alert , oriented and patient cooperative  Airway & Oxygen Therapy: Patient Spontanous Breathing and Patient connected to face mask oxygen  Post-op Assessment: Report given to RN and Post -op Vital signs reviewed and stable  Post vital signs: Reviewed and stable  Last Vitals:  Vitals Value Taken Time  BP 117/80 02/16/2018  4:14 PM  Temp    Pulse 103 02/16/2018  4:17 PM  Resp 15 02/16/2018  4:17 PM  SpO2 97 % 02/16/2018  4:17 PM  Vitals shown include unvalidated device data.  Last Pain:  Vitals:   02/16/18 1427  TempSrc:   PainSc: 7          Complications: No apparent anesthesia complications

## 2018-02-17 ENCOUNTER — Encounter (HOSPITAL_COMMUNITY): Payer: Self-pay | Admitting: Oral Surgery

## 2018-02-17 DIAGNOSIS — E1065 Type 1 diabetes mellitus with hyperglycemia: Secondary | ICD-10-CM | POA: Diagnosis not present

## 2018-02-27 ENCOUNTER — Telehealth (INDEPENDENT_AMBULATORY_CARE_PROVIDER_SITE_OTHER): Payer: Self-pay | Admitting: *Deleted

## 2018-02-27 DIAGNOSIS — M542 Cervicalgia: Secondary | ICD-10-CM | POA: Diagnosis not present

## 2018-02-27 DIAGNOSIS — M545 Low back pain: Secondary | ICD-10-CM | POA: Diagnosis not present

## 2018-02-27 DIAGNOSIS — F349 Persistent mood [affective] disorder, unspecified: Secondary | ICD-10-CM | POA: Diagnosis not present

## 2018-02-27 DIAGNOSIS — M5412 Radiculopathy, cervical region: Secondary | ICD-10-CM | POA: Diagnosis not present

## 2018-02-27 NOTE — Telephone Encounter (Signed)
Patient called and canceled TCS for 03/23/18 -- states bowels are moving good now -- she started eating prunes

## 2018-02-28 ENCOUNTER — Ambulatory Visit (INDEPENDENT_AMBULATORY_CARE_PROVIDER_SITE_OTHER): Payer: Medicare HMO | Admitting: Internal Medicine

## 2018-02-28 NOTE — Telephone Encounter (Signed)
noted 

## 2018-03-15 DIAGNOSIS — Z720 Tobacco use: Secondary | ICD-10-CM | POA: Diagnosis not present

## 2018-03-15 DIAGNOSIS — N951 Menopausal and female climacteric states: Secondary | ICD-10-CM | POA: Diagnosis not present

## 2018-03-15 DIAGNOSIS — E1043 Type 1 diabetes mellitus with diabetic autonomic (poly)neuropathy: Secondary | ICD-10-CM | POA: Diagnosis not present

## 2018-03-15 DIAGNOSIS — Z0000 Encounter for general adult medical examination without abnormal findings: Secondary | ICD-10-CM | POA: Diagnosis not present

## 2018-03-15 DIAGNOSIS — Z681 Body mass index (BMI) 19 or less, adult: Secondary | ICD-10-CM | POA: Diagnosis not present

## 2018-03-20 DIAGNOSIS — E1065 Type 1 diabetes mellitus with hyperglycemia: Secondary | ICD-10-CM | POA: Diagnosis not present

## 2018-03-23 ENCOUNTER — Encounter (HOSPITAL_COMMUNITY): Admission: RE | Payer: Self-pay | Source: Ambulatory Visit

## 2018-03-23 ENCOUNTER — Ambulatory Visit (HOSPITAL_COMMUNITY): Admission: RE | Admit: 2018-03-23 | Payer: Medicare HMO | Source: Ambulatory Visit | Admitting: Internal Medicine

## 2018-03-23 SURGERY — COLONOSCOPY
Anesthesia: Moderate Sedation

## 2018-03-24 ENCOUNTER — Telehealth: Payer: Self-pay | Admitting: Internal Medicine

## 2018-03-24 NOTE — Telephone Encounter (Signed)
[facility] ph# [phone], ext [phone]-Ref# [id number] called: they sent a request for Office Notes, Physicians Written Order Form & Medicare Labs for the Fasting Glucose & Cpeptide. Please fax to # [phone].

## 2018-03-24 NOTE — Telephone Encounter (Signed)
Fax not received requesting information will send when fax request comes through

## 2018-03-29 ENCOUNTER — Telehealth: Payer: Self-pay | Admitting: Nutrition

## 2018-03-29 NOTE — Telephone Encounter (Signed)
Message left on machine, that we can not sign for pump supplies, because she has not been seen in almost 2 years.  Telephone number given to make and appointment with [place].

## 2018-03-31 ENCOUNTER — Telehealth: Payer: Self-pay | Admitting: Internal Medicine

## 2018-03-31 NOTE — Telephone Encounter (Signed)
PT has been called, needs appointment for this to be completed not seen since 2017. Dr Cruzita Lederer will not sign until then. No call back number left.

## 2018-03-31 NOTE — Telephone Encounter (Signed)
Pam with [facility] called regarding patient. Faxed over orders multiple times and hasn't received response. Need office visit notes for order for insulin pump therapy. cb [phone] ext [phone] ref: [id number]

## 2018-04-04 DIAGNOSIS — Z1231 Encounter for screening mammogram for malignant neoplasm of breast: Secondary | ICD-10-CM | POA: Diagnosis not present

## 2018-04-06 DIAGNOSIS — Z79891 Long term (current) use of opiate analgesic: Secondary | ICD-10-CM | POA: Diagnosis not present

## 2018-04-06 DIAGNOSIS — M542 Cervicalgia: Secondary | ICD-10-CM | POA: Diagnosis not present

## 2018-04-06 DIAGNOSIS — M545 Low back pain: Secondary | ICD-10-CM | POA: Diagnosis not present

## 2018-04-06 DIAGNOSIS — M5412 Radiculopathy, cervical region: Secondary | ICD-10-CM | POA: Diagnosis not present

## 2018-04-13 DIAGNOSIS — Z780 Asymptomatic menopausal state: Secondary | ICD-10-CM | POA: Diagnosis not present

## 2018-04-13 DIAGNOSIS — E119 Type 2 diabetes mellitus without complications: Secondary | ICD-10-CM | POA: Diagnosis not present

## 2018-04-17 ENCOUNTER — Ambulatory Visit: Payer: Medicaid Other | Admitting: Podiatry

## 2018-04-19 ENCOUNTER — Encounter: Payer: Self-pay | Admitting: Podiatry

## 2018-04-19 ENCOUNTER — Ambulatory Visit (INDEPENDENT_AMBULATORY_CARE_PROVIDER_SITE_OTHER): Payer: Medicare HMO | Admitting: Podiatry

## 2018-04-19 ENCOUNTER — Ambulatory Visit (INDEPENDENT_AMBULATORY_CARE_PROVIDER_SITE_OTHER): Payer: Medicare HMO

## 2018-04-19 ENCOUNTER — Ambulatory Visit: Payer: Medicaid Other | Admitting: Podiatry

## 2018-04-19 ENCOUNTER — Other Ambulatory Visit: Payer: Self-pay | Admitting: Podiatry

## 2018-04-19 DIAGNOSIS — M205X2 Other deformities of toe(s) (acquired), left foot: Secondary | ICD-10-CM

## 2018-04-19 DIAGNOSIS — M79672 Pain in left foot: Secondary | ICD-10-CM | POA: Diagnosis not present

## 2018-04-19 DIAGNOSIS — M205X1 Other deformities of toe(s) (acquired), right foot: Secondary | ICD-10-CM

## 2018-04-19 DIAGNOSIS — H6981 Other specified disorders of Eustachian tube, right ear: Secondary | ICD-10-CM | POA: Diagnosis not present

## 2018-04-19 DIAGNOSIS — M79671 Pain in right foot: Secondary | ICD-10-CM

## 2018-04-19 DIAGNOSIS — S0340XA Sprain of jaw, unspecified side, initial encounter: Secondary | ICD-10-CM | POA: Diagnosis not present

## 2018-04-19 NOTE — Patient Instructions (Signed)
Pre-Operative Instructions  Congratulations, you have decided to take an important step towards improving your quality of life.  You can be assured that the doctors and staff at [facility] will be with you every step of the way.  Here are some important things you should know:  1. Plan to be at the surgery center/hospital at least 1 (one) hour prior to your scheduled time, unless otherwise directed by the surgical center/hospital staff.  You must have a responsible adult accompany you, remain during the surgery and drive you home.  Make sure you have directions to the surgical center/hospital to ensure you arrive on time. 2. If you are having surgery at [facility] or [facility], you will need a copy of your medical history and physical form from your family physician within one month prior to the date of surgery. We will give you a form for your primary physician to complete.  3. We make every effort to accommodate the date you request for surgery.  However, there are times where surgery dates or times have to be moved.  We will contact you as soon as possible if a change in schedule is required.   4. No aspirin/ibuprofen for one week before surgery.  If you are on aspirin, any non-steroidal anti-inflammatory medications (Mobic, Aleve, Ibuprofen) should not be taken seven (7) days prior to your surgery.  You make take Tylenol for pain prior to surgery.  5. Medications - If you are taking daily heart and blood pressure medications, seizure, reflux, allergy, asthma, anxiety, pain or diabetes medications, make sure you notify the surgery center/hospital before the day of surgery so they can tell you which medications you should take or avoid the day of surgery. 6. No food or drink after midnight the night before surgery unless directed otherwise by surgical center/hospital staff. 7. No alcoholic beverages 24-hours prior to surgery.  No smoking 24-hours prior or 24-hours after  surgery. 8. Wear loose pants or shorts. They should be loose enough to fit over bandages, boots, and casts. 9. Don't wear slip-on shoes. Sneakers are preferred. 10. Bring your boot with you to the surgery center/hospital.  Also bring crutches or a walker if your physician has prescribed it for you.  If you do not have this equipment, it will be provided for you after surgery. 11. If you have not been contacted by the surgery center/hospital by the day before your surgery, call to confirm the date and time of your surgery. 12. Leave-time from work may vary depending on the type of surgery you have.  Appropriate arrangements should be made prior to surgery with your employer. 13. Prescriptions will be provided immediately following surgery by your doctor.  Fill these as soon as possible after surgery and take the medication as directed. Pain medications will not be refilled on weekends and must be approved by the doctor. 14. Remove nail polish on the operative foot and avoid getting pedicures prior to surgery. 15. Wash the night before surgery.  The night before surgery wash the foot and leg well with water and the antibacterial soap provided. Be sure to pay special attention to beneath the toenails and in between the toes.  Wash for at least three (3) minutes. Rinse thoroughly with water and dry well with a towel.  Perform this wash unless told not to do so by your physician.  Enclosed: 1 Ice pack (please put in freezer the night before surgery)   1 Hibiclens skin cleaner     Pre-op instructions  If you have any questions regarding the instructions, please do not hesitate to call our office.  [place]: [address], , [place] [address] -- [phone]  [place]: [address] [place], [place] [address] -- [phone]  [place]: [address]  [place], [place] [address] -- [phone]  [place]: [address], [address], [place], [place] [address] -- [phone]  Website:  [email or web address] 

## 2018-04-20 ENCOUNTER — Encounter: Payer: Self-pay | Admitting: Internal Medicine

## 2018-04-20 ENCOUNTER — Ambulatory Visit (INDEPENDENT_AMBULATORY_CARE_PROVIDER_SITE_OTHER): Payer: Medicare HMO | Admitting: Internal Medicine

## 2018-04-20 VITALS — BP 110/70 | HR 88 | Ht 67.0 in | Wt 166.0 lb

## 2018-04-20 DIAGNOSIS — E1065 Type 1 diabetes mellitus with hyperglycemia: Secondary | ICD-10-CM

## 2018-04-20 DIAGNOSIS — E1042 Type 1 diabetes mellitus with diabetic polyneuropathy: Secondary | ICD-10-CM

## 2018-04-20 MED ORDER — INSULIN GLARGINE 100 UNIT/ML SOLOSTAR PEN: PEN_INJECTOR | SUBCUTANEOUS | 11 refills | Status: DC

## 2018-04-20 NOTE — Patient Instructions (Signed)
Please change: - Lantus 20 units in am and 16 units at night - Novolog dose: - 10-12 units before a breakfast - 6-10 units before lunch - 14-16 units before dinner - SSI Humalog:  150-200: + 1 unit 201-250: + 2 units 251-300: + 3 units >301: + 4 units  Try to get back on the insulin pump.  Please return in 3 months with your sugar log.

## 2018-04-20 NOTE — Progress Notes (Signed)
Patient ID: [name], female   DOB: 09-08-1970, 47 y.o.   MRN: [id number]  HPI: [name] is a 47 y.o.-year-old female, returning for f/u for DM1, dx in 73 (47 y/o), uncontrolled, with complications (cerebro-vascular ds - h/o TIA 0/2016, PN). Last visit almost 2 years ago. Had BCBS >> now [facility].  Since she changed from Humalog to Novolog 1 year ago >> sugars higher.  She is going through menopause. Lost 11 lbs in last 3 mo.   Last hemoglobin A1c was: Lab Results  Component Value Date   HGBA1C 10.8 (H) 01/08/2018   HGBA1C 11.4 06/29/2016   HGBA1C 11.0 05/17/2016   She has been on insulin pump before.  She had problems with [employer] and had to come off her Omnipod. She would like to restart an insulin pump.  She had to come see me today before I could sign the pump paperwork, since I have not seen her in 2 years.  Current regimen: - Pump settings:  - basal rates: 12 am: 0.85 units/h 7:30: 0.75 12 pm: 0.85 4 pm: 0.75 6  pm: 0.85 - ICR:   12 am: 8   1 pm: 6 >> 7 - target: 100-120 >> 110-120 - ISF: 70 - Insulin on Board: 4h - bolus wizard: on  TDD from basal insulin: 19.7 units TDD from bolus insulin: 5-10 units! - extended bolusing: not using - changes infusion site: q3 days - Meter: Omnipod meter  She is currently on: - Lantus 26 units in am. - Humalog dose as follows: - 10 units before a breakfast - 6 units before lunch - 14 units before dinner - SSI Humalog:  150-200: + 1 unit 201-250: + 2 units 251-300: + 3 units 301-350: + 4 units >350: + 5 units  Pt checks her sugars many times a day with her freestyle libre CGM and the sugars stay in the 200s to 300s. Freestyle Libre CGM parameters: - average: 234+/-68 - coefficient of variation: 29.1 (19-25%) - time in range:  - low (<70): 1% - normal (70-180): 19% - high (>180): 80%   Lowest sugar was 27 in 07/2014... >> 42 >> 43 (after correction of a high blood sugar at night);  she has no  hypoglycemia awareness! Highest sugar was 601 >> 400s >> 300s  No history of DKA or hypoglycemia admissions.  He did have hyperglycemia ED visits and an admission in 12/2017.  -No CKD, last BUN/creatinine:  Lab Results  Component Value Date   BUN 6 02/16/2018   CREATININE 0.66 02/16/2018   -+ HL; last set of lipids: Lab Results  Component Value Date   CHOL 230 (H) 05/17/2016   HDL 60.20 05/17/2016   LDLCALC 136 (H) 05/17/2016   TRIG 169.0 (H) 05/17/2016   CHOLHDL 4 05/17/2016  On Zocor. - last eye exam was in 2019: No DR - + numbness and tingling in her feet.  Previously on Neurontin, but she stopped due to side effects.  Currently on Lyrica.   No h/o hypothyroidism.  Lab Results  Component Value Date   TSH 1.59 05/17/2016   On Diazepam. Off Wellbutrin b/c constipation.  Since last visit, she had tooth surgery in 01/2018, Dr. Britta Mccreedy.  She also had bilateral big toe surgery in 09/2016 with Dr. Amalia Hailey.  ROS: Constitutional: + weight loss due to lack of appetite, + fatigue, no subjective hyperthermia, + hot flashes Eyes: no blurry vision, no xerophthalmia ENT: no sore throat, no nodules palpated in throat, no  dysphagia, no odynophagia, no hoarseness Cardiovascular: no CP/no SOB/no palpitations/+ leg swelling Respiratory: no cough/no SOB/no wheezing Gastrointestinal: + N/+ V/no D/+C/no acid reflux Musculoskeletal: + Muscle aches/+ joint aches Skin: no rashes, no hair loss Neurological: no tremors/no numbness/no tingling/no dizziness  I reviewed pt's medications, allergies, PMH, social hx, family hx, and changes were documented in the history of present illness. Otherwise, unchanged from my initial visit note.  Past Medical History:  Diagnosis Date  . Abdominal wall mass of right flank 04/11/13  . Abrasion of leg with infection 02/18/14  . Anxiety   . Benign paroxysmal vertigo 09/04/13  . Bronchitis, acute 02/28/14  . Bunion of great toe 04/11/13  . Cervicalgia 06/13/14  .  Depression   . Diabetes ([place]) 11/08/2017  . Diabetes mellitus without complication ([place])   . Dorsalgia 05/17/14  . Dysuria 03/27/14  . Gastro-esophageal reflux disease without esophagitis 01/28/14  . Gastroenteritis 01/28/14  . GERD (gastroesophageal reflux disease)   . Hiatal hernia   . Hyperlipidemia   . Hypertension   . IBS (irritable bowel syndrome)   . Migraine headache   . Neuropathy   . Pain in thoracic spine 06/13/14  . 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 PE (physical exam), annual   . Peripheral neuropathy 11/05/13  . Subungual hematoma of digit of hand   . Tendonitis 03/07/13  . TIA (transient ischemic attack)   . Tobacco use   . Vertigo    Past Surgical History:  Procedure Laterality Date  . ABDOMINAL HYSTERECTOMY    . APPENDECTOMY    . CARDIAC CATHETERIZATION   last in 2009   x 4, normal coronary arteries  . CARDIAC CATHETERIZATION N/A 03/26/2015   Procedure: Left Heart Cath and Coronary Angiography;  Surgeon: Wellington Hampshire, MD;  Location: [facility];  Service: Cardiovascular;  Laterality: N/A;  . CESAREAN SECTION    . CHOLECYSTECTOMY    . COLONOSCOPY WITH PROPOFOL N/A 01/09/2018   Procedure: COLONOSCOPY WITH PROPOFOL;  Surgeon: Rogene Houston, MD;  [facility];  Service: Endoscopy;  Laterality: N/A;  . CYST EXCISION     right breast  . ESOPHAGOGASTRODUODENOSCOPY (EGD) WITH PROPOFOL N/A 01/09/2018   Procedure: ESOPHAGOGASTRODUODENOSCOPY (EGD) WITH PROPOFOL;  Surgeon: Rogene Houston, MD;  [facility];  Service: Endoscopy;  Laterality: N/A;  . 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 TUBAL LIGATION     History   Social History  . Marital Status: Divorced    Spouse Name: N/A  . Number of Children: 1   Occupational History  .  disabled   Social History Main Topics  . Smoking status: Current Every Day Smoker -- 0.50 packs/day for 11 years  . Smokeless tobacco: Never Used     Comment: patient is aware that she needs to quit smoking  . Alcohol Use: No  . Drug Use: No   Current Outpatient Medications on File Prior to Visit  Medication Sig Dispense Refill  . clindamycin (CLEOCIN) 300 MG capsule Take 1 capsule (300 mg total) by mouth 3 (three) times daily. 21 capsule 0  . cyclobenzaprine (FLEXERIL) 10 MG tablet Take 10 mg by mouth daily as needed for muscle spasms.   0  . diazepam (VALIUM) 10 MG tablet Take 1 tablet by mouth 4 (four) times  daily - after meals and at bedtime.    . dicyclomine (BENTYL) 20 MG tablet Take 1 tablet (20 mg total) by mouth 2 (two) times daily. 20 tablet 0  . docusate sodium (COLACE) 100 MG capsule Take 100 mg by mouth 2 (two) times daily.    Marland Kitchen EPIPEN 2-PAK 0.3 MG/0.3ML SOAJ injection Inject 0.3 mg into the muscle once.     Marland Kitchen estradiol (ESTRACE) 1 MG tablet Take 1 mg by mouth daily.    . insulin aspart (NOVOLOG FLEXPEN) 100 UNIT/ML FlexPen     . Insulin Glargine (LANTUS SOLOSTAR) 100 UNIT/ML Solostar Pen Inject 24 Units into the skin daily before breakfast. 5 pen 2  . insulin lispro (HUMALOG KWIKPEN) 100 UNIT/ML KiwkPen Inject 10 Units into the skin 3 (three) times daily with meals. + SLIDING SCALE    . lisinopril (PRINIVIL,ZESTRIL) 5 MG tablet Take 5 mg by mouth daily.  2  . loratadine-pseudoephedrine (CLARITIN-D 24 HOUR) 10-240 MG 24 hr tablet Take 1 tablet by mouth daily. 14 tablet 0  . LYRICA 150 MG capsule Take 150 mg by mouth 2 (two) times daily.     . Melatonin 10 MG CAPS Take 10 mg by mouth at bedtime.     . nicotine (NICODERM CQ - DOSED IN MG/24 HOURS) 14 mg/24hr patch Place 1 patch (14 mg total) onto the skin daily as needed (Nicotine withdrawal symptoms.). 28 patch 0  . Omega-3 Fatty Acids (FISH OIL) 1200 MG CPDR Take 1 capsule by mouth daily.     Marland Kitchen omeprazole (PRILOSEC) 40 MG  capsule Take 40 mg by mouth daily as needed (for acid reflux).     Marland Kitchen oxyCODONE-acetaminophen (PERCOCET) 5-325 MG tablet Take 1 tablet by mouth every 4 (four) hours as needed. 20 tablet 0  . pravastatin (PRAVACHOL) 10 MG tablet Take 10 mg by mouth at bedtime.  2  . promethazine (PHENERGAN) 25 MG tablet Take 1 tablet (25 mg total) by mouth every 6 (six) hours as needed for nausea or vomiting. 20 tablet 0  . buPROPion (WELLBUTRIN) 75 MG tablet Take 1 tablet (75 mg total) by mouth every morning. (Patient not taking: Reported on 01/21/2018) 30 tablet 3  . tiZANidine (ZANAFLEX) 4 MG tablet      Current Facility-Administered Medications on File Prior to Visit  Medication Dose Route Frequency Provider Last Rate Last Dose  . betamethasone acetate-betamethasone sodium phosphate (CELESTONE) injection 3 mg  3 mg Intramuscular Once Edrick Kins, DPM       Allergies  Allergen Reactions  . Zocor [Simvastatin] Other (See Comments)    Muscle aches and pains  . Clindamycin/Lincomycin Rash  . Codeine Rash  . Penicillins Rash    Has patient had a PCN reaction causing immediate rash, facial/tongue/throat swelling, SOB or lightheadedness with hypotension: Yes Has patient had a PCN reaction causing severe rash involving mucus membranes or skin necrosis: Yes Has patient had a PCN reaction that required hospitalization: Yes Has patient had a PCN reaction occurring within the last 10 years: No If all of the above answers are "NO", then may proceed with Cephalosporin use.   . Sulfa Antibiotics Rash        Family History  Problem Relation Age of Onset  . Stroke Mother 78  . Heart attack Mother 73  . Cancer Sister        colon  . Heart failure Maternal Grandmother 65  . Cancer Maternal Grandfather        prostate  . Heart  failure Paternal Grandmother 37  . Heart failure Paternal Grandfather 60   PE: BP 110/70   Pulse 88   Ht 5\' 7"  (1.702 m)   Wt 166 lb (75.3 kg)   SpO2 98%   BMI 26.00 kg/m  Body  mass index is 26 kg/m. Wt Readings from Last 3 Encounters:  04/20/18 166 lb (75.3 kg)  02/16/18 165 lb (74.8 kg)  01/21/18 177 lb (80.3 kg)   Constitutional: normal weight, in NAD Eyes: PERRLA, EOMI, no exophthalmos ENT: moist mucous membranes, no thyromegaly, no cervical lymphadenopathy Cardiovascular: RRR, No MRG Respiratory: CTA B Gastrointestinal: abdomen soft, NT, ND, BS+ Musculoskeletal: no deformities, strength intact in all 4 Skin: moist, warm, no rashes Neurological: no tremor with outstretched hands, DTR normal in all 4  ASSESSMENT: 1. DM1, insulin-dependent, uncontrolled, with complications - cerebro-vascular ds - h/o TIA 0/2016 - PN Sees cardiology >> Dr, Sharlotte Alamo [facility] - [facility]  PLAN:  1. Patient with long-standing, uncontrolled, type 1 diabetes, previously on Omnipod insulin pump, however, she had to come off as her supplier started not to cover the Omnipod pumps.  She changed her insurance before last visit and she wanted to go back on the pump.  I also suggested a Dexcom CGM then.  However, afterwards, she was lost for follow-up.  As of now, she is still not on her insulin pump but she would like to start.  She has a freestyle libre CGM, and we reviewed the downloaded traces today. -Her sugars are mostly in the 200s to 300s.  There are no clear patterns, however, her sugars occasionally improve after lunch, during the afternoon, but this is not consistent. -At this visit, she tells me that if her sugars are low, which is rare, she skips both NovoLog and Lantus.  I advised her not to skip Lantus, but she may need to decrease the dose of NovoLog with that meal, if the sugars before the meal are too low. -We again discussed that a better estimation of the insulin bolus would be given by an insulin to carb ratio.  We tried this in the past for her, but she would prefer to have a fixed dose of insulin.  I will give her more flexible doses, depending on the size of her meals.   We will also increase her Lantus dose and split it in 2 as her sugars are high at all times of the day. - I suggested to:  Patient Instructions  Please change: - Lantus 20 units in am and 16 units at night - Novolog dose: - 10-12 units before a breakfast - 6-10 units before lunch - 14-16 units before dinner - SSI Humalog:  150-200: + 1 unit 201-250: + 2 units 251-300: + 3 units >301: + 4 units  Try to get back on the insulin pump.  Please return in 3 months with your sugar log.   - today, HbA1c is 9.9% (improved) - continue checking sugars at different times of the day - check many times a day with her CGM - advised for yearly eye exams >> she is UTD - At today's visit, we will check a lipid panel, ACR, TSH - She already had a flu shot for this season - Return to clinic in 3 mo with sugar log   - time spent with the patient: 40 min, of which >50% was spent in reviewing her CGM downloads, discussing her hypo- and hyper-glycemic episodes, reviewing previous labs and insulin doses and developing a plan  to avoid hypo- and hyper-glycemia.   Office Visit on 04/20/2018  Component Date Value Ref Range Status  . TSH 04/20/2018 1.63  0.35 - 4.50 uIU/mL Final  . Cholesterol 04/20/2018 171  0 - 200 mg/dL Final   ATP III Classification       Desirable:  < 200 mg/dL               Borderline High:  200 - 239 mg/dL          High:  > = 240 mg/dL  . Triglycerides 04/20/2018 99.0  0.0 - 149.0 mg/dL Final   Normal:  <150 mg/dLBorderline High:  150 - 199 mg/dL  . HDL 04/20/2018 46.60  >39.00 mg/dL Final  . VLDL 04/20/2018 19.8  0.0 - 40.0 mg/dL Final  . LDL Cholesterol 04/20/2018 105* 0 - 99 mg/dL Final  . Total CHOL/HDL Ratio 04/20/2018 4   Final                  Men          Women1/2 Average Risk     3.4          3.3Average Risk          5.0          4.42X Average Risk          9.6          7.13X Average Risk          15.0          11.0                      . NonHDL 04/20/2018 124.41   Final    NOTE:  Non-HDL goal should be 30 mg/dL higher than patient's LDL goal (i.e. LDL goal of < 70 mg/dL, would have non-HDL goal of < 100 mg/dL)  . Microalb, Ur 04/20/2018 <0.7  0.0 - 1.9 mg/dL Final  . Creatinine,U 04/20/2018 19.3  mg/dL Final  . Microalb Creat Ratio 04/20/2018 3.6  0.0 - 30.0 mg/g Final   Labs are normal/improved!  Philemon Kingdom, MD PhD [facility] Endocrinology

## 2018-04-21 LAB — POCT GLYCOSYLATED HEMOGLOBIN (HGB A1C)
HEMOGLOBIN A1C: 9.9 % — AB (ref 4.0–5.6)
HbA1c POC (<> result, manual entry): 9.9 % (ref 4.0–5.6)
HbA1c, POC (controlled diabetic range): 9.9 % — AB (ref 0.0–7.0)
HbA1c, POC (prediabetic range): 9.9 % — AB (ref 5.7–6.4)

## 2018-04-21 LAB — MICROALBUMIN / CREATININE URINE RATIO
CREATININE, U: 19.3 mg/dL
MICROALB/CREAT RATIO: 3.6 mg/g (ref 0.0–30.0)
Microalb, Ur: <0.7 mg/dL (ref 0.0–1.9)

## 2018-04-21 LAB — LIPID PANEL
CHOL/HDL RATIO: 4
Cholesterol: 171 mg/dL (ref 0–200)
HDL: 46.6 mg/dL (ref >39.00)
LDL Cholesterol: 105 mg/dL — ABNORMAL HIGH (ref 0–99)
NonHDL: 124.41
TRIGLYCERIDES: 99 mg/dL (ref 0.0–149.0)
VLDL: 19.8 mg/dL (ref 0.0–40.0)

## 2018-04-21 LAB — TSH: TSH: 1.63 u[IU]/mL (ref 0.35–4.50)

## 2018-04-22 NOTE — Progress Notes (Signed)
   HPI: 47 year old female presenting today with a chief complaint of bilateral foot pain that began 6-8 months ago. She states her feet feel tight and she has associated swelling. Wearing socks and shoes increases the pain. She has not done anything for treatment. Patient is here for further evaluation and treatment.   Past Medical History:  Diagnosis Date  . Abdominal wall mass of right flank 04/11/13  . Abrasion of leg with infection 02/18/14  . Anxiety   . Benign paroxysmal vertigo 09/04/13  . Bronchitis, acute 02/28/14  . Bunion of great toe 04/11/13  . Cervicalgia 06/13/14  . Depression   . Diabetes ([place]) 11/08/2017  . Diabetes mellitus without complication ([place])   . Dorsalgia 05/17/14  . Dysuria 03/27/14  . Gastro-esophageal reflux disease without esophagitis 01/28/14  . Gastroenteritis 01/28/14  . GERD (gastroesophageal reflux disease)   . Hiatal hernia   . Hyperlipidemia   . Hypertension   . IBS (irritable bowel syndrome)   . Migraine headache   . Neuropathy   . Pain in thoracic spine 06/13/14  . 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 PE (physical exam), annual   . Peripheral neuropathy 11/05/13  . Subungual hematoma of digit of hand   . Tendonitis 03/07/13  . TIA (transient ischemic attack)   . Tobacco use   . Vertigo      Physical Exam: General: The patient is alert and oriented x3 in no acute distress.  Dermatology: Skin is warm, dry and supple bilateral lower extremities. Negative for open lesions or macerations.  Vascular: Palpable pedal pulses bilaterally. No edema or erythema noted. Capillary refill within normal limits.  Neurological: Epicritic and protective threshold grossly intact bilaterally.   Musculoskeletal Exam: Pain on palpation with limited range of motion noted to the first MPJ of bilateral feet.  Radiographic Exam: Degenerative changes noted with joint space narrowing first MPJ. There also appears to be extra-articular spurring noted about the joint.  Orthopedic hardware and osteotomies sites appear to be stable with routine healing.  Assessment: 1. Hallux limitus bilateral  2. Symptomatic orthopedic screws bilateral    Plan of Care:  1. Patient evaluated. X-Rays reviewed.  2. Today we discussed the conservative versus surgical management of the presenting pathology. The patient opts for surgical management. All possible complications and details of the procedure were explained. All patient questions were answered. No guarantees were expressed or implied. 3. Authorization for surgery was initiated today. Surgery will consist of cheilectomy with MPJ release bilateral. Removal of orthopedic screws x 6 bilateral.  4. Return to clinic one week post op.       Edrick Kins, DPM [facility]  Dr. Edrick Kins, DPM    [address], [place] [address]                Office [phone]  Fax [phone]

## 2018-04-24 ENCOUNTER — Telehealth: Payer: Self-pay | Admitting: *Deleted

## 2018-04-24 NOTE — Telephone Encounter (Signed)
"  I'm scheduled for surgery on 10/10.  I'd like to know if you know the time of my surgery yet."  Someone from the surgical center will call you a day or two prior to your surgical date.  They will give you your arrival time.  "Okay great, can you let them know that I am Type 1 Diabetic."  I will let them know.  I called Caren Griffins at the surgical center and informed her that the patient is Diabetic.

## 2018-04-27 DIAGNOSIS — M5412 Radiculopathy, cervical region: Secondary | ICD-10-CM | POA: Diagnosis not present

## 2018-04-27 DIAGNOSIS — M542 Cervicalgia: Secondary | ICD-10-CM | POA: Diagnosis not present

## 2018-04-27 DIAGNOSIS — F349 Persistent mood [affective] disorder, unspecified: Secondary | ICD-10-CM | POA: Diagnosis not present

## 2018-04-27 DIAGNOSIS — M545 Low back pain: Secondary | ICD-10-CM | POA: Diagnosis not present

## 2018-04-29 ENCOUNTER — Other Ambulatory Visit: Payer: Self-pay

## 2018-04-29 ENCOUNTER — Emergency Department (HOSPITAL_COMMUNITY)
Admission: EM | Admit: 2018-04-29 | Discharge: 2018-04-29 | Disposition: A | Discharge status: Home / Self Care | Payer: Medicare HMO | Attending: Emergency Medicine | Admitting: Emergency Medicine

## 2018-04-29 ENCOUNTER — Encounter (HOSPITAL_COMMUNITY): Payer: Self-pay | Admitting: Emergency Medicine

## 2018-04-29 ENCOUNTER — Emergency Department (HOSPITAL_COMMUNITY): Payer: Medicare HMO

## 2018-04-29 DIAGNOSIS — E109 Type 1 diabetes mellitus without complications: Secondary | ICD-10-CM | POA: Insufficient documentation

## 2018-04-29 DIAGNOSIS — I10 Essential (primary) hypertension: Secondary | ICD-10-CM | POA: Insufficient documentation

## 2018-04-29 DIAGNOSIS — F419 Anxiety disorder, unspecified: Secondary | ICD-10-CM | POA: Diagnosis not present

## 2018-04-29 DIAGNOSIS — Z720 Tobacco use: Secondary | ICD-10-CM

## 2018-04-29 DIAGNOSIS — Z8673 Personal history of transient ischemic attack (TIA), and cerebral infarction without residual deficits: Secondary | ICD-10-CM | POA: Insufficient documentation

## 2018-04-29 DIAGNOSIS — Z79899 Other long term (current) drug therapy: Secondary | ICD-10-CM | POA: Insufficient documentation

## 2018-04-29 DIAGNOSIS — Z9049 Acquired absence of other specified parts of digestive tract: Secondary | ICD-10-CM | POA: Diagnosis not present

## 2018-04-29 DIAGNOSIS — R0789 Other chest pain: Secondary | ICD-10-CM | POA: Diagnosis not present

## 2018-04-29 DIAGNOSIS — F329 Major depressive disorder, single episode, unspecified: Secondary | ICD-10-CM | POA: Diagnosis not present

## 2018-04-29 DIAGNOSIS — R079 Chest pain, unspecified: Secondary | ICD-10-CM | POA: Diagnosis not present

## 2018-04-29 DIAGNOSIS — Z794 Long term (current) use of insulin: Secondary | ICD-10-CM | POA: Insufficient documentation

## 2018-04-29 DIAGNOSIS — F17210 Nicotine dependence, cigarettes, uncomplicated: Secondary | ICD-10-CM | POA: Diagnosis not present

## 2018-04-29 LAB — GLUCOSE, CAPILLARY: Glucose-Capillary: 87 mg/dL (ref 70–99)

## 2018-04-29 LAB — BASIC METABOLIC PANEL
ANION GAP: 5 (ref 5–15)
BUN: 5 mg/dL — ABNORMAL LOW (ref 6–20)
CALCIUM: 8.9 mg/dL (ref 8.9–10.3)
CHLORIDE: 105 mmol/L (ref 98–111)
CO2: 28 mmol/L (ref 22–32)
CREATININE: 0.65 mg/dL (ref 0.44–1.00)
GFR calc non Af Amer: >60 mL/min (ref >60)
Glucose, Bld: 159 mg/dL — ABNORMAL HIGH (ref 70–99)
Potassium: 4 mmol/L (ref 3.5–5.1)
Sodium: 138 mmol/L (ref 135–145)

## 2018-04-29 LAB — CBC
HCT: 43.5 % (ref 36.0–46.0)
Hemoglobin: 14.9 g/dL (ref 12.0–15.0)
MCH: 30.9 pg (ref 26.0–34.0)
MCHC: 34.3 g/dL (ref 30.0–36.0)
MCV: 90.2 fL (ref 78.0–100.0)
PLATELETS: 227 10*3/uL (ref 150–400)
RBC: 4.82 MIL/uL (ref 3.87–5.11)
RDW: 13.2 % (ref 11.5–15.5)
WBC: 7.6 10*3/uL (ref 4.0–10.5)

## 2018-04-29 LAB — TROPONIN I

## 2018-04-29 LAB — CBG MONITORING, ED: Glucose-Capillary: 262 mg/dL — ABNORMAL HIGH (ref 70–99)

## 2018-04-29 LAB — D-DIMER, QUANTITATIVE: D-Dimer, Quant: 0.3 ug/mL-FEU (ref 0.00–0.50)

## 2018-04-29 MED ORDER — NITROGLYCERIN 0.4 MG SL SUBL
0.4000 mg | SUBLINGUAL_TABLET | SUBLINGUAL | Status: DC | PRN
  Administered 2018-04-29 (×2): 0.4 mg via SUBLINGUAL
  Filled 2018-04-29 (×2): qty 1

## 2018-04-29 MED ORDER — ASPIRIN 325 MG PO TABS
325.0000 mg | ORAL_TABLET | Freq: Once | ORAL | Status: AC
  Administered 2018-04-29: 325 mg via ORAL
  Filled 2018-04-29: qty 1

## 2018-04-29 NOTE — ED Provider Notes (Signed)
[facility] EMERGENCY DEPARTMENT Provider Note   CSN: [id number] Arrival date & time: 04/29/18  1527     History   Chief Complaint Chief Complaint  Patient presents with  . Chest Pain    HPI [name] is a 47 y.o. female.  Pt presents to the ED today with CP.  The pt said it's been intermittent since Tuesday, September 24.  The pt said that it started again earlier today and did not go away.  She said it goes down her left arm.  She has not had any recent stress or cath.  However, she did have a normal cath in August of 2016.  The pt denies any sob.  She has not taken any asa or nitro today.     Past Medical History:  Diagnosis Date  . Abdominal wall mass of right flank 04/11/13  . Abrasion of leg with infection 02/18/14  . Anxiety   . Benign paroxysmal vertigo 09/04/13  . Bronchitis, acute 02/28/14  . Bunion of great toe 04/11/13  . Cervicalgia 06/13/14  . Depression   . Diabetes ([place]) 11/08/2017  . Diabetes mellitus without complication ([place])   . Dorsalgia 05/17/14  . Dysuria 03/27/14  . Gastro-esophageal reflux disease without esophagitis 01/28/14  . Gastroenteritis 01/28/14  . GERD (gastroesophageal reflux disease)   . Hiatal hernia   . Hyperlipidemia   . Hypertension   . IBS (irritable bowel syndrome)   . Migraine headache   . Neuropathy   . Pain in thoracic spine 06/13/14  . 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 PE (physical exam), annual   . Peripheral neuropathy 11/05/13  . Subungual hematoma of digit of hand   . Tendonitis 03/07/13  . TIA (transient ischemic attack)   . Tobacco use   . Vertigo     Patient Active Problem List   Diagnosis Date Noted  . Uncontrolled diabetes mellitus ([place]) 01/06/2018  . Depression 01/06/2018  . Family hx of colon cancer 11/10/2017  . Diabetes ([place]) 11/08/2017  . Chest pain 03/25/2015  . Hypertension   . Hyperlipidemia   . Anxiety   . Pain in the chest   . Essential hypertension   . 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 TIA (transient ischemic attack) 08/17/2014  . Tobacco abuse   . Gastro-esophageal reflux disease without esophagitis 01/28/2014  . Peripheral neuropathy 11/05/2013    Past Surgical History:  Procedure Laterality Date  . ABDOMINAL HYSTERECTOMY    . APPENDECTOMY    . CARDIAC CATHETERIZATION   last in 2009   x 4, normal coronary arteries  . CARDIAC CATHETERIZATION N/A 03/26/2015   Procedure: Left Heart Cath and Coronary Angiography;  Surgeon: Wellington Hampshire, MD;  Location: [facility];  Service: Cardiovascular;  Laterality: N/A;  . CESAREAN SECTION    . CHOLECYSTECTOMY    . COLONOSCOPY WITH PROPOFOL N/A 01/09/2018   Procedure: COLONOSCOPY WITH PROPOFOL;  Surgeon: Rogene Houston, MD;  [facility];  Service: Endoscopy;  Laterality: N/A;  . CYST EXCISION     right breast  . ESOPHAGOGASTRODUODENOSCOPY (EGD) WITH PROPOFOL N/A 01/09/2018   Procedure: ESOPHAGOGASTRODUODENOSCOPY (EGD) WITH PROPOFOL;  Surgeon: Rogene Houston, MD;  [facility];  Service: Endoscopy;  Laterality: N/A;  . 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 TUBAL LIGATION       OB History   None      Home Medications    Prior to Admission medications   Medication Sig Start Date End Date Taking? Authorizing Provider  buPROPion (WELLBUTRIN) 75 MG tablet Take 1 tablet (75 mg total) by mouth every morning. Patient not taking: Reported on 01/21/2018 01/09/18 02/08/18  Heath Lark D, DO  clindamycin (CLEOCIN) 300 MG capsule Take 1 capsule (300 mg total) by mouth 3 (three) times daily. 02/16/18   Diona Browner, DDS  cyclobenzaprine (FLEXERIL) 10 MG tablet Take 10 mg by mouth daily as needed for muscle spasms.  01/12/17   [provider]  diazepam (VALIUM) 10 MG tablet Take 1 tablet by mouth 4 (four) times daily - after meals and at bedtime. 12/27/17   [provider]  dicyclomine (BENTYL) 20 MG tablet Take 1 tablet (20 mg total) by mouth 2 (two) times daily. 01/21/18   Nuala Alpha A, PA-C  docusate sodium (COLACE) 100 MG capsule Take 100 mg by mouth 2 (two) times daily.    [provider]  EPIPEN 2-PAK 0.3 MG/0.3ML SOAJ injection Inject 0.3 mg into the muscle once.  02/13/16   [provider]  estradiol (ESTRACE) 1 MG tablet Take 1 mg by mouth daily.    [provider]  insulin aspart (NOVOLOG FLEXPEN) 100 UNIT/ML FlexPen  02/03/18   [provider]  Insulin Glargine (LANTUS SOLOSTAR) 100 UNIT/ML Solostar Pen Inject under skin 20 units in am and 16 units at bedtime 04/20/18   Philemon Kingdom, MD  insulin lispro (HUMALOG KWIKPEN) 100 UNIT/ML KiwkPen Inject 10 Units into the skin 3 (three) times daily with meals. + SLIDING SCALE    [provider]  lisinopril (PRINIVIL,ZESTRIL) 5 MG tablet Take 5 mg by mouth daily. 06/10/15   [provider]  loratadine-pseudoephedrine (CLARITIN-D 24 HOUR) 10-240 MG 24 hr tablet Take 1 tablet by mouth daily. 02/16/18   Diona Browner, DDS  LYRICA 150 MG capsule Take 150 mg by mouth 2 (two) times daily.  08/19/15   [provider]  Melatonin 10 MG CAPS Take 10 mg by mouth at bedtime.     [provider]  nicotine (NICODERM CQ - DOSED IN MG/24 HOURS) 14 mg/24hr patch Place 1 patch (14 mg total) onto the skin daily as needed (Nicotine withdrawal symptoms.). 01/09/18   Manuella Ghazi, Pratik D, DO  Omega-3 Fatty Acids (FISH OIL) 1200 MG CPDR Take 1 capsule by mouth daily.     [provider]  omeprazole (PRILOSEC) 40 MG capsule Take 40 mg by mouth daily as needed (for acid reflux).     [provider]  oxyCODONE-acetaminophen (PERCOCET) 5-325 MG tablet Take 1 tablet by mouth every 4 (four) hours as needed. 02/16/18    Diona Browner, DDS  pravastatin (PRAVACHOL) 10 MG tablet Take 10 mg by mouth at bedtime. 05/29/15   [provider]  promethazine (PHENERGAN) 25 MG tablet Take 1 tablet (25 mg total) by mouth every 6 (six) hours as needed for nausea or vomiting. 01/21/18   Deliah Boston, PA-C    Family History Family History  Problem Relation Age of Onset  . Stroke Mother 51  . Heart attack Mother 9  . Cancer Sister        colon  . Heart failure Maternal Grandmother 65  . Cancer Maternal Grandfather        prostate  .  Heart failure Paternal Grandmother 53  . Heart failure Paternal Grandfather 80    Social History Social History   Tobacco Use  . Smoking status: Current Every Day Smoker    Packs/day: 0.50    Years: 11.00    Pack years: 5.50    Types: Cigarettes  . Smokeless tobacco: Never Used  Substance Use Topics  . Alcohol use: Yes    Alcohol/week: 0.0 standard drinks    Comment: occasionally  . Drug use: No     Allergies   Zocor [simvastatin]; Clindamycin/lincomycin; Codeine; Penicillins; and Sulfa antibiotics   Review of Systems Review of Systems  Cardiovascular: Positive for chest pain.  All other systems reviewed and are negative.    Physical Exam Updated Vital Signs BP 97/66   Pulse 64   Temp 98.1 F (36.7 C) (Oral)   Resp 17   Ht 5\' 7"  (1.702 m)   Wt 72.6 kg   SpO2 96%   BMI 25.06 kg/m   Physical Exam  Constitutional: She is oriented to person, place, and time. She appears well-developed and well-nourished.  HENT:  Head: Normocephalic and atraumatic.  Eyes: Pupils are equal, round, and reactive to light. EOM are normal.  Neck: Normal range of motion. Neck supple.  Cardiovascular: Normal rate, regular rhythm, intact distal pulses and normal pulses.  Pulmonary/Chest: Effort normal and breath sounds normal.  Abdominal: Soft. Bowel sounds are normal.  Musculoskeletal: Normal range of motion.       Right lower leg: Normal.       Left lower leg:  Normal.  Neurological: She is alert and oriented to person, place, and time.  Skin: Skin is warm and dry. Capillary refill takes less than 2 seconds.  Psychiatric: She has a normal mood and affect. Her behavior is normal.  Nursing note and vitals reviewed.    ED Treatments / Results  Labs (all labs ordered are listed, but only abnormal results are displayed) Labs Reviewed  BASIC METABOLIC PANEL - Abnormal; Notable for the following components:      Result Value   Glucose, Bld 159 (*)    BUN 5 (*)    All other components within normal limits  CBG MONITORING, ED - Abnormal; Notable for the following components:   Glucose-Capillary 262 (*)    All other components within normal limits  CBC  TROPONIN I  D-DIMER, QUANTITATIVE (NOT AT [facility])  GLUCOSE, CAPILLARY  TROPONIN I    EKG EKG Interpretation  Date/Time:  Saturday April 29 2018 15:41:19 EDT Ventricular Rate:  81 PR Interval:  156 QRS Duration: 80 QT Interval:  372 QTC Calculation: 432 R Axis:   70 Text Interpretation:  Normal sinus rhythm Possible Left atrial enlargement Borderline ECG No significant change since last tracing Confirmed by Isla Pence [phone]) on 04/29/2018 5:43:49 PM   Radiology Dg Chest 2 View  Result Date: 04/29/2018 CLINICAL DATA:  Chest pain off and on for several days EXAM: CHEST - 2 VIEW COMPARISON:  01/13/2017 FINDINGS: Hyperinflation. No focal opacity or pleural effusion. Normal cardiomediastinal silhouette. No pneumothorax. Small stellate opacity at the left base. IMPRESSION: No active cardiopulmonary disease. Small stellate opacity at the left lung base, suspect summation shadow, but recommend short interval radiographic follow-up. Electronically Signed   By: Donavan Foil M.D.   On: 04/29/2018 16:24    Procedures Procedures (including critical care time)  Medications Ordered in ED Medications  nitroGLYCERIN (NITROSTAT) SL tablet 0.4 mg (0.4 mg Sublingual Given 04/29/18 1832)  aspirin  tablet  325 mg (325 mg Oral Given 04/29/18 1758)     Initial Impression / Assessment and Plan / ED Course  I have reviewed the triage vital signs and the nursing notes.  Pertinent labs & imaging results that were available during my care of the patient were reviewed by me and considered in my medical decision making (see chart for details).     I wanted pt to wait for her 2nd troponin.  Pt refuses to wait.  She is told this can tell me if she's had a MI, but she said she's tired and wants to go to bed.  She is alert and oriented and able to make this decision.  Her husband is in the room and will be with her at home.  She said she will f/u with her pcp.  She also does not have a cardiologist any more as hers moved.  She is given the number of [facility] in [place].  She is encouraged to stop smoking.  Return if worse.  Final Clinical Impressions(s) / ED Diagnoses   Final diagnoses:  Atypical chest pain  Tobacco abuse    ED Discharge Orders    None       Isla Pence, MD 04/29/18 2016

## 2018-04-29 NOTE — ED Triage Notes (Signed)
Patient c/o left side chest pain that radiates into left arm. Per patient started Tuesday and is progressively getting worse. Patient states shortness of breath with excretion, nausea, light headedness, and back pain. Denies cardiac hx. Patient also reports frequent urination. Per patient blood sugar ran low this morning on meter. 262 in triage.

## 2018-04-29 NOTE — ED Notes (Signed)
EKG in triage

## 2018-05-03 ENCOUNTER — Telehealth: Payer: Self-pay | Admitting: Cardiology

## 2018-05-03 ENCOUNTER — Encounter: Payer: Self-pay | Admitting: *Deleted

## 2018-05-03 ENCOUNTER — Encounter: Payer: Self-pay | Admitting: Cardiology

## 2018-05-03 ENCOUNTER — Ambulatory Visit (INDEPENDENT_AMBULATORY_CARE_PROVIDER_SITE_OTHER): Payer: Medicare HMO | Admitting: Cardiology

## 2018-05-03 VITALS — BP 106/60 | HR 84 | Ht 67.0 in | Wt 167.4 lb

## 2018-05-03 DIAGNOSIS — Z794 Long term (current) use of insulin: Secondary | ICD-10-CM | POA: Diagnosis not present

## 2018-05-03 DIAGNOSIS — R0789 Other chest pain: Secondary | ICD-10-CM | POA: Diagnosis not present

## 2018-05-03 DIAGNOSIS — E119 Type 2 diabetes mellitus without complications: Secondary | ICD-10-CM | POA: Diagnosis not present

## 2018-05-03 DIAGNOSIS — Z8673 Personal history of transient ischemic attack (TIA), and cerebral infarction without residual deficits: Secondary | ICD-10-CM

## 2018-05-03 DIAGNOSIS — E782 Mixed hyperlipidemia: Secondary | ICD-10-CM | POA: Diagnosis not present

## 2018-05-03 DIAGNOSIS — Z01810 Encounter for preprocedural cardiovascular examination: Secondary | ICD-10-CM | POA: Diagnosis not present

## 2018-05-03 MED ORDER — ASPIRIN EC 81 MG PO TBEC: 81.0000 mg | DELAYED_RELEASE_TABLET | Freq: Every day | ORAL | 3 refills | Status: DC

## 2018-05-03 NOTE — Telephone Encounter (Signed)
°  Precert needed for: ASAP Exercise Myoview on medications dx: atypical chest pain & pre-op evaluation   Location: [facility]    Date: May 05, 2018

## 2018-05-03 NOTE — Progress Notes (Signed)
Cardiology Office Note  Date: 05/03/2018   ID: [name], DOB Dec 12, 1970, MRN [id number]  PCP: Karsten Ro, DO  Consulting Cardiologist: Rozann Lesches, MD   Chief Complaint  Patient presents with  . History of chest pain    History of Present Illness: [name] is a 47 y.o. female referred for cardiology consultation by Dr. Gilford Raid after recent ER visit in late September with chest pain.  I reviewed the ER note, troponin I level and d-dimer level were both normal.  ECG showed no obvious acute ST segment changes in comparison to old tracings.  Chest x-ray did not report any acute cardiopulmonary process with suspected summation shadow at the left lung base.  She presents today to discuss symptoms.  She states that she has not seen her PCP recently although does follow with an endocrinologist for management of type 2 diabetes mellitus.  She describes fairly recent left-sided chest discomfort with some radiation into the left shoulder and upper arm, not precipitated by activity.  Also relative fluctuations in her blood pressure and heart rate at home when she checks her vital signs.  No cough or hemoptysis.  No fevers or chills.  She also mentions that she is preparing to have right foot surgery next week.  Record review finds previous consultation with Dr. Harl Bowie back in 2016 in the setting of chest pain.  Cardiac catheterization at that time revealed normal coronary arteries.  Discussed these results today.  She reports having prior history of TIA, not taking aspirin anymore although did not tolerate it.  She is on statin therapy as well.  Past Medical History:  Diagnosis Date  . Benign paroxysmal vertigo   . Cervicalgia   . Depression   . GERD (gastroesophageal reflux disease)   . Hiatal hernia   . History of cardiac catheterization    Normal coronary arteries 2016  . History of TIA (transient ischemic attack)   . Hyperlipidemia   . Hypertension   . IBS  (irritable bowel syndrome)   . Migraine headache   . Panic disorder with agoraphobia   . Peripheral neuropathy   . Subungual hematoma of digit of hand   . Tendonitis   . Type 2 diabetes mellitus ([place])   . Vertigo     Past Surgical History:  Procedure Laterality Date  . ABDOMINAL HYSTERECTOMY    . APPENDECTOMY    . CARDIAC CATHETERIZATION   last in 2009   x 4, normal coronary arteries  . CARDIAC CATHETERIZATION N/A 03/26/2015   Procedure: Left Heart Cath and Coronary Angiography;  Surgeon: Wellington Hampshire, MD;  Location: [place];  Service: Cardiovascular;  Laterality: N/A;  . CESAREAN SECTION    . CHOLECYSTECTOMY    . COLONOSCOPY WITH PROPOFOL N/A 01/09/2018   Procedure: COLONOSCOPY WITH PROPOFOL;  Surgeon: Rogene Houston, MD;  [facility];  Service: Endoscopy;  Laterality: N/A;  . CYST EXCISION     right breast  . ESOPHAGOGASTRODUODENOSCOPY (EGD) WITH PROPOFOL N/A 01/09/2018   Procedure: ESOPHAGOGASTRODUODENOSCOPY (EGD) WITH PROPOFOL;  Surgeon: Rogene Houston, MD;  [facility];  Service: Endoscopy;  Laterality: N/A;  . 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 TUBAL LIGATION      Current Outpatient Medications  Medication Sig Dispense Refill  .  Calcium Polycarbophil (FIBERCON PO) Take 500 mg by mouth daily.    . diazepam (VALIUM) 10 MG tablet Take 1 tablet by mouth every 8 (eight) hours as needed.     . docusate sodium (COLACE) 100 MG capsule Take 100 mg by mouth 2 (two) times daily.    Marland Kitchen EPIPEN 2-PAK 0.3 MG/0.3ML SOAJ injection Inject 0.3 mg into the muscle once.     Marland Kitchen estradiol (ESTRACE) 1 MG tablet Take 1 mg by mouth daily.    . insulin aspart (NOVOLOG FLEXPEN) 100 UNIT/ML FlexPen Inject 10 Units into the skin 3 (three) times daily with meals.  SLIDING SCALE    . Insulin Glargine (LANTUS SOLOSTAR) 100 UNIT/ML Solostar Pen Inject under skin 20 units in am and 16 units at bedtime 5 pen 11  . lisinopril (PRINIVIL,ZESTRIL) 5 MG tablet Take 5 mg by mouth daily.  2  . LYRICA 150 MG capsule Take 150 mg by mouth 2 (two) times daily.     . Melatonin 5 MG TABS Take 10 mg by mouth at bedtime.    . Omega-3 Fatty Acids (FISH OIL) 1200 MG CPDR Take 1 capsule by mouth daily.     Marland Kitchen oxyCODONE-acetaminophen (PERCOCET) 10-325 MG tablet Take 1 tablet by mouth every 6 (six) hours as needed.    . pravastatin (PRAVACHOL) 10 MG tablet Take 10 mg by mouth at bedtime.  2  . tiZANidine (ZANAFLEX) 4 MG tablet Take 4 mg by mouth 2 (two) times daily.    . vitamin B-12 (CYANOCOBALAMIN) 1000 MCG tablet Take 2,000 mcg by mouth daily.    Marland Kitchen aspirin EC 81 MG tablet Take 1 tablet (81 mg total) by mouth daily. 90 tablet 3   No current facility-administered medications for this visit.    Allergies:  Zocor [simvastatin]; Clindamycin/lincomycin; Codeine; Penicillins; and Sulfa antibiotics   Social History: The patient  reports that she has been smoking cigarettes. She has a 5.50 pack-year smoking history. She has never used smokeless tobacco. She reports that she drinks alcohol. She reports that she does not use drugs.   Family History: The patient's family history includes Cancer in her maternal grandfather and sister; Heart attack (age of onset: 84) in her mother; Heart failure (age of onset: 50) in her paternal grandfather; Heart failure (age of onset: 7) in her maternal grandmother; Heart failure (age of onset: 48) in her paternal grandmother; Stroke (age of onset: 39) in her mother.   ROS:  Please see the history of present illness. Otherwise, complete review of systems is positive for chronic pain.  All other systems are reviewed and negative.   Physical Exam: VS:  BP 106/60   Pulse 84   Ht 5\' 7"  (1.702 m)   Wt 167 lb 6.4 oz (75.9 kg)   SpO2 97%   BMI 26.22  kg/m , BMI Body mass index is 26.22 kg/m.  Wt Readings from Last 3 Encounters:  05/03/18 167 lb 6.4 oz (75.9 kg)  04/29/18 160 lb (72.6 kg)  04/20/18 166 lb (75.3 kg)    General: Patient appears comfortable at rest. HEENT: Conjunctiva and lids normal, oropharynx clear. Neck: Supple, no elevated JVP or carotid bruits, no thyromegaly. Lungs: Clear to auscultation, nonlabored breathing at rest. Cardiac: Regular rate and rhythm, no S3 or significant systolic murmur, no pericardial rub. Abdomen: Soft, nontender, bowel sounds present, no guarding or rebound. Extremities: No pitting edema, distal pulses 2+. Skin: Warm and dry. Musculoskeletal: No kyphosis. Neuropsychiatric: Alert and oriented x3, affect grossly appropriate.  ECG: I personally reviewed the tracing from 04/29/2018 which showed normal sinus rhythm with possible left atrial enlargement.  Recent Labwork: 01/06/2018: Magnesium 2.0 01/21/2018: ALT 23; AST 23 04/20/2018: TSH 1.63 04/29/2018: BUN 5; Creatinine, Ser 0.65; Hemoglobin 14.9; Platelets 227; Potassium 4.0; Sodium 138     Component Value Date/Time   CHOL 171 04/20/2018 1657   TRIG 99.0 04/20/2018 1657   HDL 46.60 04/20/2018 1657   CHOLHDL 4 04/20/2018 1657   VLDL 19.8 04/20/2018 1657   LDLCALC 105 (H) 04/20/2018 1657    Other Studies Reviewed Today:  Cardiac catheterization 03/26/2015: 1. Normal coronary arteries in a left dominant system.  2. Normal LV systolic function and left ventricular end-diastolic pressure.  Recommendations: The chest pain does not seem to be cardiac. Continue aggressive treatment of risk factors.  Echocardiogram 08/18/2014: Study Conclusions  - Left ventricle: The cavity size was normal. Systolic function was normal. The estimated ejection fraction was in the range of 55% to 60%. Wall motion was normal; there were no regional wall motion abnormalities. - Aortic valve: Valve area (Vmax): 1.98 cm^2.  Impressions:  - No cardiac  source of emboli was indentified.  Assessment and Plan:  1.  Atypical chest pain in a 47 year old woman with type 2 diabetes mellitus, hyperlipidemia, reported history of TIA, and tobacco abuse.  She has a family history of CAD as well.  Cardiac catheterization in 2016 revealed normal coronary arteries.  She reports recent onset of symptoms with ER visit noted above, negative troponin I and d-dimer level, nonacute ECG.  She is also preparing to have right foot surgery next week on Thursday.  Although it is unlikely that she has developed major obstructive CAD in the span of 3 years, with her recent onset symptoms and cardiac risk factor profile at baseline as well as pending surgery, we will arrange an exercise Myoview (convert to [place] if needed) for objective evaluation.   2.  Mixed hyperlipidemia on Pravachol.  She follows with Dr. Nadara Mustard.  Recent LDL 105.  3.  Type 2 diabetes mellitus, follows with Dr. Nadara Mustard and endocrinology.  She is on insulin.  4.  Reported history of TIA.  She had previously been on aspirin 81 mg daily and plans to resume along with statin therapy.  No recent symptoms however.  Current medicines were reviewed with the patient today.   Orders Placed This Encounter  Procedures  . NM Myocar Multi W/Spect W/Wall Motion / EF    Disposition: Call with test results.  Signed, Satira Sark, MD, [facility] 05/03/2018 11:03 AM    [facility] [place], [place], [place] [address] Phone: [phone]; Fax: [phone]

## 2018-05-03 NOTE — Patient Instructions (Addendum)
Medication Instructions:   Your physician has recommended you make the following change in your medication:   Start aspirin 81 mg by mouth daily.  Continue all other medications the same.  Labwork:  NONE  Testing/Procedures: Your physician has requested that you have en exercise stress myoview. For further information please visit [email or web address]. Please follow instruction sheet, as given. We will call you with this result.  Follow-Up:  Your physician recommends that you schedule a follow-up appointment in: pending test result.  Any Other Special Instructions Will Be Listed Below (If Applicable).  If you need a refill on your cardiac medications before your next appointment, please call your pharmacy.

## 2018-05-05 ENCOUNTER — Encounter (HOSPITAL_COMMUNITY): Payer: Medicare HMO

## 2018-05-09 ENCOUNTER — Encounter (HOSPITAL_COMMUNITY): Payer: Medicare HMO

## 2018-05-09 ENCOUNTER — Encounter (HOSPITAL_COMMUNITY): Admission: RE | Admit: 2018-05-09 | Payer: Medicare HMO | Source: Ambulatory Visit

## 2018-05-11 ENCOUNTER — Other Ambulatory Visit: Payer: Self-pay | Admitting: Podiatry

## 2018-05-11 ENCOUNTER — Encounter: Payer: Self-pay | Admitting: Podiatry

## 2018-05-11 DIAGNOSIS — T84213D Breakdown (mechanical) of internal fixation device of bones of foot and toes, subsequent encounter: Secondary | ICD-10-CM | POA: Diagnosis not present

## 2018-05-11 DIAGNOSIS — Z472 Encounter for removal of internal fixation device: Secondary | ICD-10-CM | POA: Diagnosis not present

## 2018-05-11 DIAGNOSIS — M2011 Hallux valgus (acquired), right foot: Secondary | ICD-10-CM | POA: Diagnosis not present

## 2018-05-11 DIAGNOSIS — Z4889 Encounter for other specified surgical aftercare: Secondary | ICD-10-CM | POA: Diagnosis not present

## 2018-05-11 DIAGNOSIS — Z832 Family history of diseases of the blood and blood-forming organs and certain disorders involving the immune mechanism: Secondary | ICD-10-CM | POA: Diagnosis not present

## 2018-05-11 DIAGNOSIS — M205X2 Other deformities of toe(s) (acquired), left foot: Secondary | ICD-10-CM | POA: Diagnosis not present

## 2018-05-11 DIAGNOSIS — M2012 Hallux valgus (acquired), left foot: Secondary | ICD-10-CM | POA: Diagnosis not present

## 2018-05-11 DIAGNOSIS — M205X1 Other deformities of toe(s) (acquired), right foot: Secondary | ICD-10-CM | POA: Diagnosis not present

## 2018-05-11 MED ORDER — OXYCODONE-ACETAMINOPHEN 5-325 MG PO TABS: 1.0000 | ORAL_TABLET | Freq: Four times a day (QID) | ORAL | 0 refills | Status: DC | PRN

## 2018-05-11 NOTE — Progress Notes (Unsigned)
Postop medication

## 2018-05-12 ENCOUNTER — Telehealth: Payer: Self-pay | Admitting: Podiatry

## 2018-05-12 DIAGNOSIS — Z794 Long term (current) use of insulin: Secondary | ICD-10-CM | POA: Diagnosis not present

## 2018-05-12 DIAGNOSIS — E1151 Type 2 diabetes mellitus with diabetic peripheral angiopathy without gangrene: Secondary | ICD-10-CM | POA: Diagnosis not present

## 2018-05-12 DIAGNOSIS — I10 Essential (primary) hypertension: Secondary | ICD-10-CM | POA: Diagnosis not present

## 2018-05-12 DIAGNOSIS — Z4789 Encounter for other orthopedic aftercare: Secondary | ICD-10-CM | POA: Diagnosis not present

## 2018-05-12 MED ORDER — PROMETHAZINE HCL 25 MG PO TABS: 25.0000 mg | ORAL_TABLET | Freq: Three times a day (TID) | ORAL | 0 refills | Status: DC | PRN

## 2018-05-12 NOTE — Telephone Encounter (Signed)
I informed pt of Dr. Amalia Hailey orders for phenergan.

## 2018-05-12 NOTE — Telephone Encounter (Signed)
This is [name], Therapist, sports with [facility]. I saw pt for start of care today and I wanted to let the doctor know the planned frequency. It will be 1 week 1, 3 week 1, 2 week 1 for nursing and I would like to add physical therapy. Also, while I was there today, the pt stated she was having nausea to the pain medication and wanted to know if you could call something in to help with the nausea. If you need to speak to me, my number is [phone]. Thank you.

## 2018-05-12 NOTE — Addendum Note (Signed)
Addended by: Harriett Sine D on: 05/12/2018 04:11 PM   Modules accepted: Orders

## 2018-05-16 DIAGNOSIS — Z4789 Encounter for other orthopedic aftercare: Secondary | ICD-10-CM | POA: Diagnosis not present

## 2018-05-16 DIAGNOSIS — E1065 Type 1 diabetes mellitus with hyperglycemia: Secondary | ICD-10-CM | POA: Diagnosis not present

## 2018-05-16 DIAGNOSIS — I10 Essential (primary) hypertension: Secondary | ICD-10-CM | POA: Diagnosis not present

## 2018-05-16 DIAGNOSIS — Z794 Long term (current) use of insulin: Secondary | ICD-10-CM | POA: Diagnosis not present

## 2018-05-16 DIAGNOSIS — E1151 Type 2 diabetes mellitus with diabetic peripheral angiopathy without gangrene: Secondary | ICD-10-CM | POA: Diagnosis not present

## 2018-05-17 ENCOUNTER — Other Ambulatory Visit: Payer: Self-pay

## 2018-05-17 ENCOUNTER — Ambulatory Visit (INDEPENDENT_AMBULATORY_CARE_PROVIDER_SITE_OTHER): Payer: Medicare HMO | Admitting: Podiatry

## 2018-05-17 ENCOUNTER — Ambulatory Visit (INDEPENDENT_AMBULATORY_CARE_PROVIDER_SITE_OTHER): Payer: Medicare HMO

## 2018-05-17 DIAGNOSIS — M205X2 Other deformities of toe(s) (acquired), left foot: Secondary | ICD-10-CM

## 2018-05-17 DIAGNOSIS — T8189XA Other complications of procedures, not elsewhere classified, initial encounter: Secondary | ICD-10-CM | POA: Diagnosis not present

## 2018-05-17 DIAGNOSIS — M205X1 Other deformities of toe(s) (acquired), right foot: Secondary | ICD-10-CM

## 2018-05-17 DIAGNOSIS — Z9889 Other specified postprocedural states: Secondary | ICD-10-CM

## 2018-05-17 MED ORDER — OXYCODONE-ACETAMINOPHEN 5-325 MG PO TABS: 1.0000 | ORAL_TABLET | Freq: Four times a day (QID) | ORAL | 0 refills | Status: DC | PRN

## 2018-05-18 DIAGNOSIS — E1151 Type 2 diabetes mellitus with diabetic peripheral angiopathy without gangrene: Secondary | ICD-10-CM | POA: Diagnosis not present

## 2018-05-18 DIAGNOSIS — Z4789 Encounter for other orthopedic aftercare: Secondary | ICD-10-CM | POA: Diagnosis not present

## 2018-05-18 DIAGNOSIS — I10 Essential (primary) hypertension: Secondary | ICD-10-CM | POA: Diagnosis not present

## 2018-05-18 DIAGNOSIS — Z794 Long term (current) use of insulin: Secondary | ICD-10-CM | POA: Diagnosis not present

## 2018-05-19 DIAGNOSIS — E1065 Type 1 diabetes mellitus with hyperglycemia: Secondary | ICD-10-CM | POA: Diagnosis not present

## 2018-05-19 NOTE — Telephone Encounter (Signed)
-----   Message from Edrick Kins, DPM sent at 05/17/2018  4:20 PM EDT ----- Regarding: Physical therapy [place],   I believe [facility] he is already coming to the home for dressing changes.  Can we please order physical therapy 3x/week x 4 weeks through [facility].   Diagnosis: Bilateral foot surgery.  Thanks, Dr. Amalia Hailey

## 2018-05-19 NOTE — Telephone Encounter (Signed)
Faxed Dr. Amalia Hailey' [facility] PT orders to [facility].

## 2018-05-23 DIAGNOSIS — Z794 Long term (current) use of insulin: Secondary | ICD-10-CM | POA: Diagnosis not present

## 2018-05-23 DIAGNOSIS — Z4789 Encounter for other orthopedic aftercare: Secondary | ICD-10-CM | POA: Diagnosis not present

## 2018-05-23 DIAGNOSIS — I10 Essential (primary) hypertension: Secondary | ICD-10-CM | POA: Diagnosis not present

## 2018-05-23 DIAGNOSIS — E1151 Type 2 diabetes mellitus with diabetic peripheral angiopathy without gangrene: Secondary | ICD-10-CM | POA: Diagnosis not present

## 2018-05-24 ENCOUNTER — Encounter: Payer: Medicare HMO | Admitting: Podiatry

## 2018-05-25 DIAGNOSIS — I10 Essential (primary) hypertension: Secondary | ICD-10-CM | POA: Diagnosis not present

## 2018-05-25 DIAGNOSIS — Z794 Long term (current) use of insulin: Secondary | ICD-10-CM | POA: Diagnosis not present

## 2018-05-25 DIAGNOSIS — E1151 Type 2 diabetes mellitus with diabetic peripheral angiopathy without gangrene: Secondary | ICD-10-CM | POA: Diagnosis not present

## 2018-05-25 DIAGNOSIS — Z4789 Encounter for other orthopedic aftercare: Secondary | ICD-10-CM | POA: Diagnosis not present

## 2018-05-28 NOTE — Progress Notes (Signed)
   Subjective:  Patient presents today status post bilateral cheilectomy and removal of hardware. DOS: 05/11/18. She states she is doing well overall. She reports some continued pain and associated swelling. She has been using the post op shoes as directed without issue and taking Percocet for pain. There are no modifying factors noted. Patient is here for further evaluation and treatment.    Past Medical History:  Diagnosis Date  . Benign paroxysmal vertigo   . Cervicalgia   . Depression   . GERD (gastroesophageal reflux disease)   . Hiatal hernia   . History of cardiac catheterization    Normal coronary arteries 2016  . History of TIA (transient ischemic attack)   . Hyperlipidemia   . Hypertension   . IBS (irritable bowel syndrome)   . Migraine headache   . Panic disorder with agoraphobia   . Peripheral neuropathy   . Subungual hematoma of digit of hand   . Tendonitis   . Type 2 diabetes mellitus ([place])   . Vertigo       Objective/Physical Exam Neurovascular status intact.  Skin incisions appear to be well coapted with sutures and staples intact. No sign of infectious process noted. No dehiscence. No active bleeding noted. Moderate edema noted to the surgical extremity.  Radiographic Exam:  No hardware remaining. Normal osseous mineralization. Joint spaces preserved. No fracture/dislocation/boney destruction.    Assessment: 1. s/p bilateral cheilectomy and removal of hardware. DOS: 05/11/18.   Plan of Care:  1. Patient was evaluated. X-rays reviewed 2. Dressing changed.  3. Continue weightbearing in post op shoes bilateral.  4. Orders for physical therapy three times weekly for four weeks.  5. Refill prescription for Percocet 5/325 mg #30 provided to patient.  6. Return to clinic in one week.    Edrick Kins, DPM [facility]  Dr. Edrick Kins, [address]                                        [place], [place] [address]                  Office [phone]  Fax [phone]

## 2018-05-29 ENCOUNTER — Ambulatory Visit (INDEPENDENT_AMBULATORY_CARE_PROVIDER_SITE_OTHER): Payer: Medicare HMO | Admitting: Podiatry

## 2018-05-29 DIAGNOSIS — M205X1 Other deformities of toe(s) (acquired), right foot: Secondary | ICD-10-CM

## 2018-05-29 DIAGNOSIS — M205X2 Other deformities of toe(s) (acquired), left foot: Secondary | ICD-10-CM | POA: Diagnosis not present

## 2018-05-29 DIAGNOSIS — Z9889 Other specified postprocedural states: Secondary | ICD-10-CM

## 2018-05-30 DIAGNOSIS — M5412 Radiculopathy, cervical region: Secondary | ICD-10-CM | POA: Diagnosis not present

## 2018-05-30 DIAGNOSIS — M542 Cervicalgia: Secondary | ICD-10-CM | POA: Diagnosis not present

## 2018-05-30 DIAGNOSIS — M545 Low back pain: Secondary | ICD-10-CM | POA: Diagnosis not present

## 2018-05-30 DIAGNOSIS — Z79891 Long term (current) use of opiate analgesic: Secondary | ICD-10-CM | POA: Diagnosis not present

## 2018-05-30 DIAGNOSIS — F349 Persistent mood [affective] disorder, unspecified: Secondary | ICD-10-CM | POA: Diagnosis not present

## 2018-05-30 NOTE — Progress Notes (Signed)
   Subjective:  Patient presents today status post bilateral cheilectomy and removal of hardware. DOS: 05/11/18. She states she is doing well overall. She denies any significant pain or modifying factors. She has been using the post op shoes as directed without issue. Patient is here for further evaluation and treatment.    Past Medical History:  Diagnosis Date  . Benign paroxysmal vertigo   . Cervicalgia   . Depression   . GERD (gastroesophageal reflux disease)   . Hiatal hernia   . History of cardiac catheterization    Normal coronary arteries 2016  . History of TIA (transient ischemic attack)   . Hyperlipidemia   . Hypertension   . IBS (irritable bowel syndrome)   . Migraine headache   . Panic disorder with agoraphobia   . Peripheral neuropathy   . Subungual hematoma of digit of hand   . Tendonitis   . Type 2 diabetes mellitus ([place])   . Vertigo       Objective/Physical Exam Neurovascular status intact.  Skin incisions appear to be well coapted with sutures and staples intact. No sign of infectious process noted. No dehiscence. No active bleeding noted. Moderate edema noted to the surgical extremity.  Assessment: 1. s/p bilateral cheilectomy and removal of hardware. DOS: 05/11/18.   Plan of Care:  1. Patient was evaluated.  2. Sutures removed.  3. Compression anklets dispensed bilaterally.  4. Resume wearing good shoe gear.  5. Return to clinic in 4 weeks.    Edrick Kins, DPM [facility]  Dr. Edrick Kins, [address]                                        [place],  [address]                Office [phone]  Fax [phone]

## 2018-06-05 ENCOUNTER — Telehealth: Payer: Self-pay | Admitting: Podiatry

## 2018-06-05 NOTE — Telephone Encounter (Signed)
This is [name] calling from [facility]. We had her for postop care and her service was discharged on 24 October. However, I understand, she has had some communications with Dr. Amalia Hailey and would like PT again. Would you check into that for me? I can be reached at [phone].

## 2018-06-06 NOTE — Telephone Encounter (Signed)
Dr. Amalia Hailey states evaluation and treatment S/P 05/11/2018 B/L hallux limitus PT. Orders called to [place], [facility].

## 2018-06-12 ENCOUNTER — Telehealth: Payer: Self-pay | Admitting: Internal Medicine

## 2018-06-12 DIAGNOSIS — E1065 Type 1 diabetes mellitus with hyperglycemia: Secondary | ICD-10-CM

## 2018-06-12 DIAGNOSIS — J019 Acute sinusitis, unspecified: Secondary | ICD-10-CM | POA: Diagnosis not present

## 2018-06-12 DIAGNOSIS — J069 Acute upper respiratory infection, unspecified: Secondary | ICD-10-CM | POA: Diagnosis not present

## 2018-06-12 DIAGNOSIS — E1042 Type 1 diabetes mellitus with diabetic polyneuropathy: Secondary | ICD-10-CM

## 2018-06-12 DIAGNOSIS — R110 Nausea: Secondary | ICD-10-CM | POA: Diagnosis not present

## 2018-06-12 NOTE — Telephone Encounter (Signed)
Patient called stating that Magda Paganini ([employer]) was supposed to send down a message to Dr. Cruzita Lederer about filling   insulin aspart (NOVOLOG FLEXPEN) 100 UNIT/ML FlexPen   This has not been done. Please Advise, thanks

## 2018-06-13 MED ORDER — INSULIN ASPART 100 UNIT/ML FLEXPEN: 10.0000 [IU] | PEN_INJECTOR | Freq: Three times a day (TID) | SUBCUTANEOUS | 2 refills | Status: DC

## 2018-06-13 NOTE — Telephone Encounter (Signed)
RX sent

## 2018-06-16 DIAGNOSIS — E1065 Type 1 diabetes mellitus with hyperglycemia: Secondary | ICD-10-CM | POA: Diagnosis not present

## 2018-06-19 ENCOUNTER — Telehealth: Payer: Self-pay | Admitting: Podiatry

## 2018-06-19 DIAGNOSIS — E1151 Type 2 diabetes mellitus with diabetic peripheral angiopathy without gangrene: Secondary | ICD-10-CM | POA: Diagnosis not present

## 2018-06-19 DIAGNOSIS — Z794 Long term (current) use of insulin: Secondary | ICD-10-CM | POA: Diagnosis not present

## 2018-06-19 DIAGNOSIS — I10 Essential (primary) hypertension: Secondary | ICD-10-CM | POA: Diagnosis not present

## 2018-06-19 DIAGNOSIS — F329 Major depressive disorder, single episode, unspecified: Secondary | ICD-10-CM | POA: Diagnosis not present

## 2018-06-19 DIAGNOSIS — R2689 Other abnormalities of gait and mobility: Secondary | ICD-10-CM | POA: Diagnosis not present

## 2018-06-19 DIAGNOSIS — Z4789 Encounter for other orthopedic aftercare: Secondary | ICD-10-CM | POA: Diagnosis not present

## 2018-06-19 NOTE — Telephone Encounter (Signed)
I called Collier Salina, PT - [facility] states pt is doing really good, they will be seeing her 2 more weeks, and feels she would benefit from out-of-home PT beginning in December. Peter, PT states pt has a insulin pump and he would like to see if Dr. Amalia Hailey would okay skilled nursing to the house to give detailed instructions on the pump. I told him Dr. Amalia Hailey would agree, to give pt a better working knowledge of the pump. Dr. Amalia Hailey would discuss PT further at pt's Monday appt.

## 2018-06-19 NOTE — Telephone Encounter (Signed)
Phy Therapy evaluation done today. Their recommendation is that patient go to out patient therapy and wants to address a few things in patients house. Please give him a call back.

## 2018-06-22 DIAGNOSIS — E1151 Type 2 diabetes mellitus with diabetic peripheral angiopathy without gangrene: Secondary | ICD-10-CM | POA: Diagnosis not present

## 2018-06-22 DIAGNOSIS — F329 Major depressive disorder, single episode, unspecified: Secondary | ICD-10-CM | POA: Diagnosis not present

## 2018-06-22 DIAGNOSIS — I10 Essential (primary) hypertension: Secondary | ICD-10-CM | POA: Diagnosis not present

## 2018-06-22 DIAGNOSIS — Z794 Long term (current) use of insulin: Secondary | ICD-10-CM | POA: Diagnosis not present

## 2018-06-22 DIAGNOSIS — R2689 Other abnormalities of gait and mobility: Secondary | ICD-10-CM | POA: Diagnosis not present

## 2018-06-22 DIAGNOSIS — Z4789 Encounter for other orthopedic aftercare: Secondary | ICD-10-CM | POA: Diagnosis not present

## 2018-06-26 ENCOUNTER — Ambulatory Visit (INDEPENDENT_AMBULATORY_CARE_PROVIDER_SITE_OTHER): Payer: Medicare HMO | Admitting: Podiatry

## 2018-06-26 ENCOUNTER — Encounter: Payer: Self-pay | Admitting: Podiatry

## 2018-06-26 DIAGNOSIS — M205X2 Other deformities of toe(s) (acquired), left foot: Secondary | ICD-10-CM

## 2018-06-26 DIAGNOSIS — M205X1 Other deformities of toe(s) (acquired), right foot: Secondary | ICD-10-CM

## 2018-06-26 DIAGNOSIS — Z9889 Other specified postprocedural states: Secondary | ICD-10-CM

## 2018-06-26 NOTE — Patient Instructions (Signed)
Superfeet insoles at [employer].

## 2018-06-27 DIAGNOSIS — M545 Low back pain: Secondary | ICD-10-CM | POA: Diagnosis not present

## 2018-06-27 NOTE — Progress Notes (Signed)
   Subjective:  Patient presents today status post bilateral cheilectomy and removal of hardware. DOS: 05/11/18. She states she is doing well overall. She reports some mild pain in the right foot but nothing significant. She denies any modifying factors. She has been using the compression anklets as directed. Patient is here for further evaluation and treatment.    Past Medical History:  Diagnosis Date  . Benign paroxysmal vertigo   . Cervicalgia   . Depression   . GERD (gastroesophageal reflux disease)   . Hiatal hernia   . History of cardiac catheterization    Normal coronary arteries 2016  . History of TIA (transient ischemic attack)   . Hyperlipidemia   . Hypertension   . IBS (irritable bowel syndrome)   . Migraine headache   . Panic disorder with agoraphobia   . Peripheral neuropathy   . Subungual hematoma of digit of hand   . Tendonitis   . Type 2 diabetes mellitus ([place])   . Vertigo       Objective/Physical Exam Neurovascular status intact.  Skin incisions appear to be well coapted. No sign of infectious process noted. No dehiscence. No active bleeding noted. Moderate edema noted to the surgical extremity.  Assessment: 1. s/p bilateral cheilectomy and removal of hardware. DOS: 05/11/18.   Plan of Care:  1. Patient was evaluated.  2. Continue ROM exercises daily.  3. Continue wearing good shoe gear.  4. Recommended OTC insoles.  5. Return to clinic as needed.    Edrick Kins, DPM [facility]  Dr. Edrick Kins, [address]                                        [place], [place] [address]                Office [phone]  Fax [phone]

## 2018-06-28 DIAGNOSIS — E1065 Type 1 diabetes mellitus with hyperglycemia: Secondary | ICD-10-CM | POA: Diagnosis not present

## 2018-07-09 DIAGNOSIS — M25511 Pain in right shoulder: Secondary | ICD-10-CM | POA: Diagnosis not present

## 2018-07-09 DIAGNOSIS — G8929 Other chronic pain: Secondary | ICD-10-CM | POA: Diagnosis not present

## 2018-07-09 DIAGNOSIS — F17200 Nicotine dependence, unspecified, uncomplicated: Secondary | ICD-10-CM | POA: Diagnosis not present

## 2018-07-09 DIAGNOSIS — Z79899 Other long term (current) drug therapy: Secondary | ICD-10-CM | POA: Diagnosis not present

## 2018-07-09 DIAGNOSIS — E109 Type 1 diabetes mellitus without complications: Secondary | ICD-10-CM | POA: Diagnosis not present

## 2018-07-10 ENCOUNTER — Ambulatory Visit: Payer: Medicare HMO | Admitting: Internal Medicine

## 2018-07-11 ENCOUNTER — Telehealth: Payer: Self-pay | Admitting: Nutrition

## 2018-07-11 ENCOUNTER — Encounter

## 2018-07-11 DIAGNOSIS — M7551 Bursitis of right shoulder: Secondary | ICD-10-CM | POA: Diagnosis not present

## 2018-07-11 DIAGNOSIS — R200 Anesthesia of skin: Secondary | ICD-10-CM | POA: Diagnosis not present

## 2018-07-11 DIAGNOSIS — M25511 Pain in right shoulder: Secondary | ICD-10-CM | POA: Diagnosis not present

## 2018-07-11 DIAGNOSIS — R202 Paresthesia of skin: Secondary | ICD-10-CM | POA: Diagnosis not present

## 2018-07-11 DIAGNOSIS — M542 Cervicalgia: Secondary | ICD-10-CM | POA: Diagnosis not present

## 2018-07-11 NOTE — Telephone Encounter (Signed)
Patient reported that she started her pump on her own and put in the settings per D.r Gherge's orders.  She said she did not need a review or check on the settings.  She also linked her meter to her pump, but is not using it, because her insurance does not cover thiem.  She was given the [place] number to call for assistance with this.  She is requesting Novolog in a vial for pump cartridge filling. Not to [facility] for this.  She reports that blood sugars are "Much better"

## 2018-07-12 NOTE — Telephone Encounter (Signed)
Opened in error

## 2018-07-13 ENCOUNTER — Other Ambulatory Visit: Payer: Self-pay

## 2018-07-13 NOTE — Telephone Encounter (Signed)
-----   Message from Ocie Doyne, RN sent at 07/11/2018 11:19 AM EST ----- Please order Novolog in a vial for her.  She is taking 62u/day via a pump  She uses [facility] in [place]    Thank you

## 2018-07-14 ENCOUNTER — Telehealth: Payer: Self-pay | Admitting: Internal Medicine

## 2018-07-14 MED ORDER — INSULIN ASPART 100 UNIT/ML ~~LOC~~ SOLN: SUBCUTANEOUS | 2 refills | Status: DC

## 2018-07-14 NOTE — Progress Notes (Signed)
DOS   05/11/2018  Cheilectomy bilateral feet. Removal of hardware bilateral feet.

## 2018-07-14 NOTE — Telephone Encounter (Signed)
RX sent

## 2018-07-14 NOTE — Telephone Encounter (Signed)
error 

## 2018-07-16 DIAGNOSIS — E1065 Type 1 diabetes mellitus with hyperglycemia: Secondary | ICD-10-CM | POA: Diagnosis not present

## 2018-07-20 ENCOUNTER — Encounter: Payer: Self-pay | Admitting: Orthopedic Surgery

## 2018-07-20 DIAGNOSIS — E1042 Type 1 diabetes mellitus with diabetic polyneuropathy: Secondary | ICD-10-CM | POA: Diagnosis not present

## 2018-07-20 DIAGNOSIS — G5601 Carpal tunnel syndrome, right upper limb: Secondary | ICD-10-CM | POA: Diagnosis not present

## 2018-07-24 DIAGNOSIS — M542 Cervicalgia: Secondary | ICD-10-CM | POA: Diagnosis not present

## 2018-07-24 DIAGNOSIS — M545 Low back pain: Secondary | ICD-10-CM | POA: Diagnosis not present

## 2018-07-24 DIAGNOSIS — F349 Persistent mood [affective] disorder, unspecified: Secondary | ICD-10-CM | POA: Diagnosis not present

## 2018-07-24 DIAGNOSIS — M5412 Radiculopathy, cervical region: Secondary | ICD-10-CM | POA: Diagnosis not present

## 2018-07-27 ENCOUNTER — Ambulatory Visit: Payer: Medicare HMO | Admitting: Internal Medicine

## 2018-07-28 DIAGNOSIS — E1065 Type 1 diabetes mellitus with hyperglycemia: Secondary | ICD-10-CM | POA: Diagnosis not present

## 2018-07-31 DIAGNOSIS — R1084 Generalized abdominal pain: Secondary | ICD-10-CM | POA: Diagnosis not present

## 2018-07-31 DIAGNOSIS — Z6826 Body mass index (BMI) 26.0-26.9, adult: Secondary | ICD-10-CM | POA: Diagnosis not present

## 2018-07-31 DIAGNOSIS — R197 Diarrhea, unspecified: Secondary | ICD-10-CM | POA: Diagnosis not present

## 2018-07-31 DIAGNOSIS — R5383 Other fatigue: Secondary | ICD-10-CM | POA: Diagnosis not present

## 2018-08-04 ENCOUNTER — Telehealth: Payer: Self-pay | Admitting: Internal Medicine

## 2018-08-04 ENCOUNTER — Telehealth: Payer: Self-pay | Admitting: Podiatry

## 2018-08-04 DIAGNOSIS — M25531 Pain in right wrist: Secondary | ICD-10-CM | POA: Diagnosis not present

## 2018-08-04 DIAGNOSIS — G5601 Carpal tunnel syndrome, right upper limb: Secondary | ICD-10-CM | POA: Diagnosis not present

## 2018-08-04 MED ORDER — FREESTYLE LIBRE 14 DAY SENSOR MISC: 1.0000 | 2 refills | Status: DC

## 2018-08-04 NOTE — Telephone Encounter (Signed)
[employer] reimbursement program is calling to check the status of paperwork that was faxed over on 12/30   Ph- [phone]

## 2018-08-04 NOTE — Telephone Encounter (Signed)
Phy Therapist with [facility] calling to inform doctor they are discharging the patient from Home physical therapy. Saw pt twice in Nov. and then patient refused any other care

## 2018-08-04 NOTE — Telephone Encounter (Signed)
Spoke to patient, she now uses the [place] CGM and therefore no longer uses test strips.  I let the [employer] people know.

## 2018-08-16 ENCOUNTER — Encounter: Payer: Self-pay | Admitting: Orthopedic Surgery

## 2018-08-16 ENCOUNTER — Ambulatory Visit (INDEPENDENT_AMBULATORY_CARE_PROVIDER_SITE_OTHER): Payer: Medicare HMO | Admitting: Orthopedic Surgery

## 2018-08-16 VITALS — BP 105/74 | HR 75 | Ht 67.0 in | Wt 158.0 lb

## 2018-08-16 DIAGNOSIS — E1065 Type 1 diabetes mellitus with hyperglycemia: Secondary | ICD-10-CM | POA: Diagnosis not present

## 2018-08-16 DIAGNOSIS — G5601 Carpal tunnel syndrome, right upper limb: Secondary | ICD-10-CM

## 2018-08-16 DIAGNOSIS — G5602 Carpal tunnel syndrome, left upper limb: Secondary | ICD-10-CM

## 2018-08-16 NOTE — Progress Notes (Signed)
[name]  08/16/2018  HISTORY SECTION :  Chief Complaint  Patient presents with  . Hand Pain    right worse than left    HPI The patient presents for evaluation of lateral carpal tunnel syndrome  Patient referred to [place] by Dr. Merlene Laughter neurology  Complains of bilateral numbness and tingling of both upper extremities with weakness associated with dropping things from her hand.  Symptoms been present now for several months did not respond to brace therapy she notices tightness in her fingers as well and perceives numbness and tingling in all 5 digits.  She experiences nocturnal symptoms and moderate to severe pain   Review of Systems  Neurological: Positive for tingling, sensory change and weakness.  All other systems reviewed and are negative.   Past Medical History:  Diagnosis Date  . Benign paroxysmal vertigo   . Cervicalgia   . Depression   . GERD (gastroesophageal reflux disease)   . Hiatal hernia   . History of cardiac catheterization    Normal coronary arteries 2016  . History of TIA (transient ischemic attack)   . Hyperlipidemia   . Hypertension   . IBS (irritable bowel syndrome)   . Migraine headache   . Panic disorder with agoraphobia   . Peripheral neuropathy   . Subungual hematoma of digit of hand   . Tendonitis   . Type 2 diabetes mellitus ([place])   . Vertigo     Past Surgical History:  Procedure Laterality Date  . ABDOMINAL HYSTERECTOMY    . APPENDECTOMY    . CARDIAC CATHETERIZATION   last in 2009   x 4, normal coronary arteries  . CARDIAC CATHETERIZATION N/A 03/26/2015   Procedure: Left Heart Cath and Coronary Angiography;  Surgeon: Wellington Hampshire, MD;  Location: [facility];  Service: Cardiovascular;  Laterality: N/A;  . CESAREAN SECTION    . CHOLECYSTECTOMY    . COLONOSCOPY WITH PROPOFOL N/A 01/09/2018   Procedure: COLONOSCOPY WITH PROPOFOL;  Surgeon: Rogene Houston, MD;  [facility];  Service: Endoscopy;  Laterality:  N/A;  . CYST EXCISION     right breast  . ESOPHAGOGASTRODUODENOSCOPY (EGD) WITH PROPOFOL N/A 01/09/2018   Procedure: ESOPHAGOGASTRODUODENOSCOPY (EGD) WITH PROPOFOL;  Surgeon: Rogene Houston, MD;  [facility];  Service: Endoscopy;  Laterality: N/A;  . 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 TUBAL LIGATION      Social History   Tobacco Use  . Smoking status: Current Every Day Smoker    Packs/day: 0.50    Years: 11.00    Pack years: 5.50    Types: Cigarettes  . Smokeless tobacco: Never Used  Substance Use Topics  . Alcohol use: Yes    Alcohol/week: 0.0 standard drinks    Comment: occasionally  . Drug use: No   Family History  Problem Relation Age of Onset  . Stroke Mother 58  . Heart attack Mother 65  . Cancer Sister        colon  . Heart failure Maternal Grandmother 65  . Cancer Maternal Grandfather        prostate  . Heart failure Paternal Grandmother 88  . Heart failure Paternal Grandfather 60     Allergies  Allergen Reactions  . Zocor [Simvastatin] Other (See Comments)    Muscle  aches and pains  . Clindamycin/Lincomycin Rash  . Codeine Rash  . Penicillins Rash    Has patient had a PCN reaction causing immediate rash, facial/tongue/throat swelling, SOB or lightheadedness with hypotension: Yes Has patient had a PCN reaction causing severe rash involving mucus membranes or skin necrosis: Yes Has patient had a PCN reaction that required hospitalization: Yes Has patient had a PCN reaction occurring within the last 10 years: No If all of the above answers are "NO", then may proceed with Cephalosporin use.   . Sulfa Antibiotics Rash          Current Outpatient Medications:  .  aspirin EC 81 MG tablet, Take 1 tablet (81 mg total) by mouth daily., Disp: 90  tablet, Rfl: 3 .  Calcium Polycarbophil (FIBERCON PO), Take 500 mg by mouth daily., Disp: , Rfl:  .  Continuous Blood Gluc Sensor (FREESTYLE LIBRE 14 DAY SENSOR) MISC, 1 each by Does not apply route every 14 (fourteen) days., Disp: 2 each, Rfl: 2 .  diazepam (VALIUM) 10 MG tablet, Take 1 tablet by mouth every 8 (eight) hours as needed. , Disp: , Rfl:  .  docusate sodium (COLACE) 100 MG capsule, Take 100 mg by mouth 2 (two) times daily., Disp: , Rfl:  .  EPIPEN 2-PAK 0.3 MG/0.3ML SOAJ injection, Inject 0.3 mg into the muscle once. , Disp: , Rfl:  .  estradiol (ESTRACE) 1 MG tablet, Take 1 mg by mouth daily., Disp: , Rfl:  .  GLUCAGEN HYPOKIT 1 MG SOLR injection, , Disp: , Rfl:  .  insulin aspart (NOVOLOG) 100 UNIT/ML injection, Inject 62 units via pump daily, Disp: 6 vial, Rfl: 2 .  LYRICA 150 MG capsule, Take 150 mg by mouth 2 (two) times daily. , Disp: , Rfl:  .  Melatonin 5 MG TABS, , Disp: , Rfl:  .  omega-3 fish oil (MAXEPA) 1000 MG CAPS capsule, , Disp: , Rfl:  .  pravastatin (PRAVACHOL) 10 MG tablet, Take 10 mg by mouth at bedtime., Disp: , Rfl: 2 .  tiZANidine (ZANAFLEX) 4 MG tablet, Take 4 mg by mouth 2 (two) times daily., Disp: , Rfl:  .  vitamin B-12 (CYANOCOBALAMIN) 1000 MCG tablet, Take 2,000 mcg by mouth daily., Disp: , Rfl:  .  cetirizine (ZYRTEC) 10 MG tablet, , Disp: , Rfl:  .  cyclobenzaprine (FLEXERIL) 10 MG tablet, Take by mouth., Disp: , Rfl:  .  dimenhyDRINATE (DRAMAMINE) 50 MG tablet, , Disp: , Rfl:  .  DULoxetine (CYMBALTA) 30 MG capsule, , Disp: , Rfl:  .  insulin aspart (NOVOLOG FLEXPEN) 100 UNIT/ML FlexPen, Inject 10 Units into the skin 3 (three) times daily with meals. SLIDING SCALE (Patient not taking: Reported on 08/16/2018), Disp: 15 mL, Rfl: 2 .  Insulin Glargine (LANTUS SOLOSTAR) 100 UNIT/ML Solostar Pen, Inject under skin 20 units in am and 16 units at bedtime (Patient not taking: Reported on 08/16/2018), Disp: 5 pen, Rfl: 11 .  insulin lispro (HUMALOG) 100 UNIT/ML  injection, 10-20 units 3 times a day, Disp: , Rfl:  .  lisinopril (PRINIVIL,ZESTRIL) 5 MG tablet, Take 5 mg by mouth daily., Disp: , Rfl: 2   PHYSICAL EXAM SECTION: BP 105/74   Pulse 75   Ht 5\' 7"  (1.702 m)   Wt 158 lb (71.7 kg)   BMI 24.75 kg/m   Body mass index is 24.75 kg/m. General appearance: Well-developed well-nourished no gross deformities  Eyes clear normal vision no evidence of conjunctivitis or jaundice, extraocular muscles intact  ENT: ears hearing normal, nasal passages clear, throat clear   Lymph nodes: No lymphadenopathy or left upper extremity  Neck is supple without palpable mass, full range of motion no thyroid enlargement or tracheal deviation  Cardiovascular normal pulse and perfusion in all 4 extremities normal color without edema  Neurologically deep tendon reflexes are equal and normal,  no pathologic reflexes  Psychological: Awake alert and oriented x3 mood and affect normal  Skin no lacerations or ulcerations no nodularity no palpable masses, no erythema or nodularity  Musculoskeletal:  Right and left upper extremity Normal appearance of both upper extremities including the hands with some tightness noted in both upper extremities. Sensory loss is noted in the median nerve distribution and also somewhat into portions of the small finger Grip strength is equal though diminished bilaterally Color and capillary refill are normal Tenderness is noted over the carpal tunnel with a positive carpal tunnel compression test positive Phalen's test at 10 seconds bilaterally Both wrist joints are stable no deformities are noted in either extremity     MEDICAL DECISION SECTION:  Encounter Diagnoses  Name Primary?  . Carpal tunnel syndrome of right wrist Yes  . Left carpal tunnel syndrome     Imaging Nerve conduction study  Notes from Dr. 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  She is advised about tenderness over the incision for up to 6 months.  The procedure has been fully reviewed with the patient; The risks and benefits of surgery have been discussed and explained and understood. Alternative treatment has also been reviewed, questions were encouraged and answered. The postoperative plan is also been reviewed.  Right CTR   1:54 PM

## 2018-08-16 NOTE — Patient Instructions (Signed)
Open Carpal Tunnel Release    Open carpal tunnel release is a surgery to relieve symptoms caused by carpal tunnel syndrome. The carpal tunnel is a narrow, hollow space in the wrist. It is located between the wrist bones and a band of connective tissue (transverse carpal ligament, also known as the flexor retinaculum). The nerve that supplies most of the hand (median nerve) passes through the carpal tunnel, and so do tissues that connect bones to muscles (tendons) in the hand and arm. Carpal tunnel syndrome makes this space swell and become narrow. The swelling pinches the median nerve and causes pain and numbness.  During carpal tunnel release surgery, the transverse carpal ligament is cut to make more room in the carpal tunnel space. This also lessens the pressure on the median nerve. You may have this surgery if other types of treatment have not relieved your carpal tunnel symptoms. This surgery is usually done only for the hand that you use more often (dominant hand), but it may be done for both hands depending on your symptoms.  Tell a health care provider about:  · Any allergies you have.  · All medicines you are taking, including vitamins, herbs, eye drops, creams, and over-the-counter medicines.  · Any problems you or family members have had with anesthetic medicines.  · Any blood disorders you have.  · Any surgeries you have had.  · Any medical conditions you have.  · Whether you are pregnant or may be pregnant.  What are the risks?  Generally, this is a safe procedure. However, problems may occur, including:  · Infection.  · Bleeding.  · Injury to the median nerve.  · Allergic reactions to medicines.  · The surgery failing to relieve your symptoms, or making your symptoms worse.  What happens before the procedure?  Medicines  · Ask your health care provider about:  ? Changing or stopping your regular medicines. This is especially important if you are taking diabetes medicines or blood thinners.  ? Taking  medicines such as aspirin and ibuprofen. These medicines can thin your blood. Do not take these medicines unless your health care provider tells you to take them.  ? Taking over-the-counter medicines, vitamins, herbs, and supplements.  · You may be given antibiotic medicine to help prevent infection.  Staying hydrated  Follow instructions from your health care provider about hydration, which may include:  · Up to 2 hours before the procedure - you may continue to drink clear liquids, such as water, clear fruit juice, black coffee, and plain tea.  Eating and drinking restrictions  Follow instructions from your health care provider about eating and drinking, which may include:  · 8 hours before the procedure - stop eating heavy meals or foods such as meat, fried foods, or fatty foods.  · 6 hours before the procedure - stop eating light meals or foods, such as toast or cereal.  · 6 hours before the procedure - stop drinking milk or drinks that contain milk.  · 2 hours before the procedure - stop drinking clear liquids.  General instructions  · Ask your health care provider how your surgical site will be marked or identified.  · You may be asked to shower with a germ-killing soap.  · Plan to have someone take you home from the hospital or clinic.  · Plan to have a responsible adult care for you for at least 24 hours after you leave the hospital or clinic. This is important.  What happens   during the procedure?  · To lower your risk of infection:  ? Your health care team will wash or sanitize their hands.  ? Hair may be removed from the surgical area.  ? Your arm, hand, and wrist will be cleaned with a germ-killing (antiseptic) solution.  · An IV will be inserted into one of your veins.  · You will be given one of the following:  ? A medicine to numb the wrist area (local anesthetic). You may also be given a medicine to help you relax (sedative).  ? A medicine to make you fall asleep (general anesthetic).  · An incision  will be made in your wrist, on the same side as your palm.  · The skin of your wrist will be spread to expose the transverse carpal ligament.  · The transverse carpal ligament will be cut to make more room in the carpal tunnel space.  · Your incision will be closed with stitches (sutures) or staples.  · A bandage (dressing) will be placed over your wrist and wrapped around your hand and wrist.  The procedure may vary among health care providers and hospitals.  What happens after the procedure?  · Your blood pressure, heart rate, breathing rate, and blood oxygen level will be monitored until the medicines you were given have worn off.  · You will be given pain medicine as needed.  · A splint or brace may be placed over your dressing, to hold your hand and wrist in place while you heal.  · Do not drive until your health care provider approves.  Summary  · Carpal tunnel release is a surgery to relieve pain and numbness in the hand caused by swelling around a nerve (carpal tunnel syndrome).  · You may have this surgery if other types of treatment have not relieved your carpal tunnel symptoms.  · During carpal tunnel release surgery, a band of connective tissue (transverse carpal ligament) is cut to make more room in the carpal tunnel space.  This information is not intended to replace advice given to you by your health care provider. Make sure you discuss any questions you have with your health care provider.  Document Released: 10/09/2003 Document Revised: 03/28/2017 Document Reviewed: 03/28/2017  Elsevier Interactive Patient Education © 2019 [employer]

## 2018-08-17 NOTE — Patient Instructions (Signed)
[name]  08/17/2018     @PREFPERIOPPHARMACY @   Your procedure is scheduled on  08/24/2018 .  Report to [facility] at  615  A.M.  Call this number if you have problems the morning of surgery:  [phone]   Remember:  Do not eat or drink after midnight.                       Take these medicines the morning of surgery with A SIP OF WATER  Valium ( if needed), lisinopril, lyrica, zanaflex (if needed). Dial your insulin pump down to your basal rate and leave it connected. You do not have to take it off.    Do not wear jewelry, make-up or nail polish.  Do not wear lotions, powders, or perfumes, or deodorant.  Do not shave 48 hours prior to surgery.  Men may shave face and neck.  Do not bring valuables to the hospital.  [facility] is not responsible for any belongings or valuables.  Contacts, dentures or bridgework may not be worn into surgery.  Leave your suitcase in the car.  After surgery it may be brought to your room.  For patients admitted to the hospital, discharge time will be determined by your treatment team.  Patients discharged the day of surgery will not be allowed to drive home.   Name and phone number of your driver:   family Special instructions:  None  Please read over the following fact sheets that you were given. Anesthesia Post-op Instructions and Care and Recovery After Surgery       Open Carpal Tunnel Release  Open carpal tunnel release is a surgery to relieve symptoms caused by carpal tunnel syndrome. The carpal tunnel is a narrow, hollow space in the wrist. It is located between the wrist bones and a band of connective tissue (transverse carpal ligament, also known as the flexor retinaculum). The nerve that supplies most of the hand (median nerve) passes through the carpal tunnel, and so do tissues that connect bones to muscles (tendons) in the hand and arm. Carpal tunnel syndrome makes this space swell and become narrow. The  swelling pinches the median nerve and causes pain and numbness. During carpal tunnel release surgery, the transverse carpal ligament is cut to make more room in the carpal tunnel space. This also lessens the pressure on the median nerve. You may have this surgery if other types of treatment have not relieved your carpal tunnel symptoms. This surgery is usually done only for the hand that you use more often (dominant hand), but it may be done for both hands depending on your symptoms. Tell a health care provider about:  Any allergies you have.  All medicines you are taking, including vitamins, herbs, eye drops, creams, and over-the-counter medicines.  Any problems you or family members have had with anesthetic medicines.  Any blood disorders you have.  Any surgeries you have had.  Any medical conditions you have.  Whether you are pregnant or may be pregnant. What are the risks? Generally, this is a safe procedure. However, problems may occur, including:  Infection.  Bleeding.  Injury to the median nerve.  Allergic reactions to medicines.  The surgery failing to relieve your symptoms, or making your symptoms worse. What happens before the procedure? Medicines  Ask your health care provider about: ? Changing or stopping your regular medicines. This is especially important if you  are taking diabetes medicines or blood thinners. ? Taking medicines such as aspirin and ibuprofen. These medicines can thin your blood. Do not take these medicines unless your health care provider tells you to take them. ? Taking over-the-counter medicines, vitamins, herbs, and supplements.  You may be given antibiotic medicine to help prevent infection. Staying hydrated Follow instructions from your health care provider about hydration, which may include:  Up to 2 hours before the procedure - you may continue to drink clear liquids, such as water, clear fruit juice, black coffee, and plain tea. Eating  and drinking restrictions Follow instructions from your health care provider about eating and drinking, which may include:  8 hours before the procedure - stop eating heavy meals or foods such as meat, fried foods, or fatty foods.  6 hours before the procedure - stop eating light meals or foods, such as toast or cereal.  6 hours before the procedure - stop drinking milk or drinks that contain milk.  2 hours before the procedure - stop drinking clear liquids. General instructions  Ask your health care provider how your surgical site will be marked or identified.  You may be asked to shower with a germ-killing soap.  Plan to have someone take you home from the hospital or clinic.  Plan to have a responsible adult care for you for at least 24 hours after you leave the hospital or clinic. This is important. What happens during the procedure?  To lower your risk of infection: ? Your health care team will wash or sanitize their hands. ? Hair may be removed from the surgical area. ? Your arm, hand, and wrist will be cleaned with a germ-killing (antiseptic) solution.  An IV will be inserted into one of your veins.  You will be given one of the following: ? A medicine to numb the wrist area (local anesthetic). You may also be given a medicine to help you relax (sedative). ? A medicine to make you fall asleep (general anesthetic).  An incision will be made in your wrist, on the same side as your palm.  The skin of your wrist will be spread to expose the transverse carpal ligament.  The transverse carpal ligament will be cut to make more room in the carpal tunnel space.  Your incision will be closed with stitches (sutures) or staples.  A bandage (dressing) will be placed over your wrist and wrapped around your hand and wrist. The procedure may vary among health care providers and hospitals. What happens after the procedure?  Your blood pressure, heart rate, breathing rate, and blood  oxygen level will be monitored until the medicines you were given have worn off.  You will be given pain medicine as needed.  A splint or brace may be placed over your dressing, to hold your hand and wrist in place while you heal.  Do not drive until your health care provider approves. Summary  Carpal tunnel release is a surgery to relieve pain and numbness in the hand caused by swelling around a nerve (carpal tunnel syndrome).  You may have this surgery if other types of treatment have not relieved your carpal tunnel symptoms.  During carpal tunnel release surgery, a band of connective tissue (transverse carpal ligament) is cut to make more room in the carpal tunnel space. This information is not intended to replace advice given to you by your health care provider. Make sure you discuss any questions you have with your health care provider. Document Released:  10/09/2003 Document Revised: 03/28/2017 Document Reviewed: 03/28/2017 Elsevier Interactive Patient Education  2019 [place].  Open Carpal Tunnel Release, Care After This sheet gives you information about how to care for yourself after your procedure. Your health care provider may also give you more specific instructions. If you have problems or questions, contact your health care provider. What can I expect after the procedure? After the procedure, it is common to have:  Wrist stiffness.  Bruising. Follow these instructions at home: Bathing  Do not take baths, swim, or use a hot tub until your health care provider approves. Ask your health care provider if you may take showers.  Keep your bandage (dressing) dry until your health care provider says it can be removed. If you have a splint or brace:  Wear the splint or brace as told by your health care provider. You may need to wear it for 2-3 weeks. Remove it only as told by your health care provider.  Loosen the splint or brace if your fingers tingle, become numb, or  turn cold and blue.  Keep the splint or brace clean.  If the splint or brace is not waterproof: ? Do not let it get wet. ? Cover it with a watertight covering when you take a bath or a shower. Incision care   Follow instructions from your health care provider about how to take care of your incision. Make sure you: ? Wash your hands with soap and water before you change your dressing. If soap and water are not available, use hand sanitizer. ? Change your dressing as told by your health care provider. ? Leave stitches (sutures), skin glue, or adhesive strips in place. These skin closures may need to stay in place for 2 weeks or longer. If adhesive strip edges start to loosen and curl up, you may trim the loose edges. Do not remove adhesive strips completely unless your health care provider tells you to do that.  Check your incision area every day for signs of infection. Check for: ? Redness, swelling, or pain. ? Fluid or blood. ? Warmth. ? Pus or a bad smell. Managing pain, stiffness, and swelling   If directed, put ice on the affected area. ? If you have a removable splint or brace, remove it as told by your health care provider. ? Put ice in a plastic bag. ? Place a towel between your skin and the bag. ? Leave the ice on for 20 minutes, 2-3 times a day.  Move your fingers often to avoid stiffness and to lessen swelling.  Raise (elevate) your wrist above the level of your heart while you are sitting or lying down. Activity  Do not drive until your health care provider approves.  Do not drive or use heavy machinery while taking prescription pain medicine.  Return to your normal activities as told by your health care provider. Avoid activities that cause pain.  If physical therapy was prescribed, do exercises as told by your therapist. Physical therapy can help you heal faster and regain movement. General instructions  Take over-the-counter and prescription medicines only as  told by your health care provider.  If you are taking prescription pain medicine, take actions to prevent or treat constipation. Your health care provider may recommend that you: ? Drink enough fluid to keep your urine pale yellow. ? Eat foods that are high in fiber, such as fresh fruits and vegetables, whole grains, and beans. ? Limit foods that are high in fat and processed  sugars, such as fried or sweet foods. ? Take an over-the-counter or prescription medicine for constipation.  Do not use any products that contain nicotine or tobacco, such as cigarettes and e-cigarettes. If you need help quitting, ask your health care provider.  Keep all follow-up visits as told by your health care provider and physical therapist. This is important. Contact a health care provider if:  You have redness or swelling around your incision.  You have fluid or blood coming from your incision.  Your incision feels warm to the touch.  You have pus or a bad smell coming from your incision.  You have a fever.  You have chills.  You have pain that does not get better with medicine.  Your carpal tunnel symptoms do not go away after 2 months.  Your carpal tunnel symptoms go away and then come back. Get help right away if:  You have pain or numbness that is getting worse.  Your fingers or fingertips become very pale or bluish in color.  You are not able to move your fingers. Summary  It is common to have wrist stiffness and bruising after a carpal tunnel release.  Icing and raising (elevating) your wrist may help to lessen swelling and pain.  Call your health care provider if you have a fever or notice any signs of infection in your incision area. This information is not intended to replace advice given to you by your health care provider. Make sure you discuss any questions you have with your health care provider. Document Released: 02/05/2005 Document Revised: 03/28/2017 Document Reviewed:  03/28/2017 Elsevier Interactive Patient Education  2019 [place] Anesthesia is a term that refers to techniques, procedures, and medicines that help a person stay safe and comfortable during a medical procedure. Monitored anesthesia care, or sedation, is one type of anesthesia. Your anesthesia specialist may recommend sedation if you will be having a procedure that does not require you to be unconscious, such as:  Cataract surgery.  A dental procedure.  A biopsy.  A colonoscopy. During the procedure, you may receive a medicine to help you relax (sedative). There are three levels of sedation:  Mild sedation. At this level, you may feel awake and relaxed. You will be able to follow directions.  Moderate sedation. At this level, you will be sleepy. You may not remember the procedure.  Deep sedation. At this level, you will be asleep. You will not remember the procedure. The more medicine you are given, the deeper your level of sedation will be. Depending on how you respond to the procedure, the anesthesia specialist may change your level of sedation or the type of anesthesia to fit your needs. An anesthesia specialist will monitor you closely during the procedure. Let your health care provider know about:  Any allergies you have.  All medicines you are taking, including vitamins, herbs, eye drops, creams, and over-the-counter medicines.  Any use of steroids (by mouth or as a cream).  Any problems you or family members have had with sedatives and anesthetic medicines.  Any blood disorders you have.  Any surgeries you have had.  Any medical conditions you have, such as sleep apnea.  Whether you are pregnant or may be pregnant.  Any use of cigarettes, alcohol, or street drugs. What are the risks? Generally, this is a safe procedure. However, problems may occur, including:  Getting too much medicine (oversedation).  Nausea.  Allergic reaction  to medicines.  Trouble breathing. If  this happens, a breathing tube may be used to help with breathing. It will be removed when you are awake and breathing on your own.  Heart trouble.  Lung trouble. Before the procedure Staying hydrated Follow instructions from your health care provider about hydration, which may include:  Up to 2 hours before the procedure - you may continue to drink clear liquids, such as water, clear fruit juice, black coffee, and plain tea. Eating and drinking restrictions Follow instructions from your health care provider about eating and drinking, which may include:  8 hours before the procedure - stop eating heavy meals or foods such as meat, fried foods, or fatty foods.  6 hours before the procedure - stop eating light meals or foods, such as toast or cereal.  6 hours before the procedure - stop drinking milk or drinks that contain milk.  2 hours before the procedure - stop drinking clear liquids. Medicines Ask your health care provider about:  Changing or stopping your regular medicines. This is especially important if you are taking diabetes medicines or blood thinners.  Taking medicines such as aspirin and ibuprofen. These medicines can thin your blood. Do not take these medicines before your procedure if your health care provider instructs you not to. Tests and exams  You will have a physical exam.  You may have blood tests done to show: ? How well your kidneys and liver are working. ? How well your blood can clot. General instructions  Plan to have someone take you home from the hospital or clinic.  If you will be going home right after the procedure, plan to have someone with you for 24 hours.  What happens during the procedure?  Your blood pressure, heart rate, breathing, level of pain and overall condition will be monitored.  An IV tube will be inserted into one of your veins.  Your anesthesia specialist will give you medicines as  needed to keep you comfortable during the procedure. This may mean changing the level of sedation.  The procedure will be performed. After the procedure  Your blood pressure, heart rate, breathing rate, and blood oxygen level will be monitored until the medicines you were given have worn off.  Do not drive for 24 hours if you received a sedative.  You may: ? Feel sleepy, clumsy, or nauseous. ? Feel forgetful about what happened after the procedure. ? Have a sore throat if you had a breathing tube during the procedure. ? Vomit. This information is not intended to replace advice given to you by your health care provider. Make sure you discuss any questions you have with your health care provider. Document Released: 04/14/2005 Document Revised: 12/26/2015 Document Reviewed: 11/09/2015 Elsevier Interactive Patient Education  2019 [place], Care After These instructions provide you with information about caring for yourself after your procedure. Your health care provider may also give you more specific instructions. Your treatment has been planned according to current medical practices, but problems sometimes occur. Call your health care provider if you have any problems or questions after your procedure. What can I expect after the procedure? After your procedure, you may:  Feel sleepy for several hours.  Feel clumsy and have poor balance for several hours.  Feel forgetful about what happened after the procedure.  Have poor judgment for several hours.  Feel nauseous or vomit.  Have a sore throat if you had a breathing tube during the procedure. Follow these instructions at home: For at least  24 hours after the procedure:      Have a responsible adult stay with you. It is important to have someone help care for you until you are awake and alert.  Rest as needed.  Do not: ? Participate in activities in which you could fall or become  injured. ? Drive. ? Use heavy machinery. ? Drink alcohol. ? Take sleeping pills or medicines that cause drowsiness. ? Make important decisions or sign legal documents. ? Take care of children on your own. Eating and drinking  Follow the diet that is recommended by your health care provider.  If you vomit, drink water, juice, or soup when you can drink without vomiting.  Make sure you have little or no nausea before eating solid foods. General instructions  Take over-the-counter and prescription medicines only as told by your health care provider.  If you have sleep apnea, surgery and certain medicines can increase your risk for breathing problems. Follow instructions from your health care provider about wearing your sleep device: ? Anytime you are sleeping, including during daytime naps. ? While taking prescription pain medicines, sleeping medicines, or medicines that make you drowsy.  If you smoke, do not smoke without supervision.  Keep all follow-up visits as told by your health care provider. This is important. Contact a health care provider if:  You keep feeling nauseous or you keep vomiting.  You feel light-headed.  You develop a rash.  You have a fever. Get help right away if:  You have trouble breathing. Summary  For several hours after your procedure, you may feel sleepy and have poor judgment.  Have a responsible adult stay with you for at least 24 hours or until you are awake and alert. This information is not intended to replace advice given to you by your health care provider. Make sure you discuss any questions you have with your health care provider. Document Released: 11/09/2015 Document Revised: 03/04/2017 Document Reviewed: 11/09/2015 Elsevier Interactive Patient Education  2019 [employer].

## 2018-08-18 ENCOUNTER — Encounter (HOSPITAL_COMMUNITY)
Admission: RE | Admit: 2018-08-18 | Discharge: 2018-08-18 | Disposition: A | Discharge status: Home / Self Care | Payer: Medicare HMO | Source: Ambulatory Visit | Attending: Orthopedic Surgery | Admitting: Orthopedic Surgery

## 2018-08-18 ENCOUNTER — Encounter (HOSPITAL_COMMUNITY): Payer: Self-pay

## 2018-08-18 DIAGNOSIS — Z01812 Encounter for preprocedural laboratory examination: Secondary | ICD-10-CM | POA: Insufficient documentation

## 2018-08-18 LAB — CBC WITH DIFFERENTIAL/PLATELET
Abs Immature Granulocytes: 0.02 10*3/uL (ref 0.00–0.07)
Basophils Absolute: 0.1 10*3/uL (ref 0.0–0.1)
Basophils Relative: 1 %
Eosinophils Absolute: 0.2 10*3/uL (ref 0.0–0.5)
Eosinophils Relative: 2 %
HEMATOCRIT: 43.4 % (ref 36.0–46.0)
Hemoglobin: 14.4 g/dL (ref 12.0–15.0)
Immature Granulocytes: 0 %
Lymphocytes Relative: 41 %
Lymphs Abs: 3.5 10*3/uL (ref 0.7–4.0)
MCH: 30.4 pg (ref 26.0–34.0)
MCHC: 33.2 g/dL (ref 30.0–36.0)
MCV: 91.8 fL (ref 80.0–100.0)
Monocytes Absolute: 0.5 10*3/uL (ref 0.1–1.0)
Monocytes Relative: 6 %
Neutro Abs: 4.3 10*3/uL (ref 1.7–7.7)
Neutrophils Relative %: 50 %
Platelets: 193 10*3/uL (ref 150–400)
RBC: 4.73 MIL/uL (ref 3.87–5.11)
RDW: 12.9 % (ref 11.5–15.5)
WBC: 8.6 10*3/uL (ref 4.0–10.5)
nRBC: 0 % (ref 0.0–0.2)

## 2018-08-18 LAB — BASIC METABOLIC PANEL
Anion gap: 11 (ref 5–15)
BUN: 7 mg/dL (ref 6–20)
CO2: 27 mmol/L (ref 22–32)
Calcium: 9.4 mg/dL (ref 8.9–10.3)
Chloride: 99 mmol/L (ref 98–111)
Creatinine, Ser: 0.73 mg/dL (ref 0.44–1.00)
GFR calc Af Amer: >60 mL/min (ref >60)
GFR calc non Af Amer: >60 mL/min (ref >60)
Glucose, Bld: 304 mg/dL — ABNORMAL HIGH (ref 70–99)
Potassium: 4.2 mmol/L (ref 3.5–5.1)
Sodium: 137 mmol/L (ref 135–145)

## 2018-08-18 LAB — GLUCOSE, CAPILLARY: Glucose-Capillary: 282 mg/dL — ABNORMAL HIGH (ref 70–99)

## 2018-08-18 LAB — HEMOGLOBIN A1C
Hgb A1c MFr Bld: 8.9 % — ABNORMAL HIGH (ref 4.8–5.6)
MEAN PLASMA GLUCOSE: 208.73 mg/dL

## 2018-08-21 NOTE — Pre-Procedure Instructions (Signed)
HgbA1C routed to PCP. 

## 2018-08-22 ENCOUNTER — Encounter: Payer: Self-pay | Admitting: Internal Medicine

## 2018-08-22 ENCOUNTER — Ambulatory Visit (INDEPENDENT_AMBULATORY_CARE_PROVIDER_SITE_OTHER): Payer: Medicare HMO | Admitting: Internal Medicine

## 2018-08-22 VITALS — BP 120/70 | HR 87 | Ht 67.0 in | Wt 161.0 lb

## 2018-08-22 DIAGNOSIS — E1065 Type 1 diabetes mellitus with hyperglycemia: Secondary | ICD-10-CM

## 2018-08-22 DIAGNOSIS — E1042 Type 1 diabetes mellitus with diabetic polyneuropathy: Secondary | ICD-10-CM | POA: Diagnosis not present

## 2018-08-22 LAB — POCT GLYCOSYLATED HEMOGLOBIN (HGB A1C): HEMOGLOBIN A1C: 8.5 % — AB (ref 4.0–5.6)

## 2018-08-22 NOTE — Patient Instructions (Addendum)
Please increase: - basal rates: 12 am: 0.70 units/h >> 0.80 8 am: 0.80 - ICR:   12 am: 9   1 pm: 9 - target: 130-130 - ISF: 45 - Insulin on Board: 4h - bolus wizard: on  Please do the following approximately 15 minutes before every meal: - Enter carbs (C) - Enter sugars (S) - Start insulin bolus (I)  Please return in 3-4 months.

## 2018-08-22 NOTE — Addendum Note (Signed)
Addended by: Cardell Peach I on: 08/22/2018 02:06 PM   Modules accepted: Orders

## 2018-08-22 NOTE — H&P (Signed)
[name]   08/16/2018   HISTORY SECTION :       Chief Complaint  Patient presents with  . Hand Pain      right worse than left     HPI The patient presents for evaluation of lateral carpal tunnel syndrome   Patient referred to [place] by Dr. Merlene Laughter neurology   Complains of bilateral numbness and tingling of both upper extremities with weakness associated with dropping things from her hand.  Symptoms been present now for several months did not respond to brace therapy she notices tightness in her fingers as well and perceives numbness and tingling in all 5 digits.  She experiences nocturnal symptoms and moderate to severe pain     Review of Systems  Neurological: Positive for tingling, sensory change and weakness.  All other systems reviewed and are negative.         Past Medical History:  Diagnosis Date  . Benign paroxysmal vertigo    . Cervicalgia    . Depression    . GERD (gastroesophageal reflux disease)    . Hiatal hernia    . History of cardiac catheterization      Normal coronary arteries 2016  . History of TIA (transient ischemic attack)    . Hyperlipidemia    . Hypertension    . IBS (irritable bowel syndrome)    . Migraine headache    . Panic disorder with agoraphobia    . Peripheral neuropathy    . Subungual hematoma of digit of hand    . Tendonitis    . Type 2 diabetes mellitus ([place])    . Vertigo             Past Surgical History:  Procedure Laterality Date  . ABDOMINAL HYSTERECTOMY      . APPENDECTOMY      . CARDIAC CATHETERIZATION    last in 2009    x 4, normal coronary arteries  . CARDIAC CATHETERIZATION N/A 03/26/2015    Procedure: Left Heart Cath and Coronary Angiography;  Surgeon: Wellington Hampshire, MD;  Location: [place] [facility];  Service: Cardiovascular;  Laterality: N/A;  . CESAREAN SECTION      . CHOLECYSTECTOMY      . COLONOSCOPY WITH PROPOFOL N/A 01/09/2018    Procedure: COLONOSCOPY WITH PROPOFOL;  Surgeon: Rogene Houston, MD;   [facility];  Service: Endoscopy;  Laterality: N/A;  . CYST EXCISION        right breast  . ESOPHAGOGASTRODUODENOSCOPY (EGD) WITH PROPOFOL N/A 01/09/2018    Procedure: ESOPHAGOGASTRODUODENOSCOPY (EGD) WITH PROPOFOL;  Surgeon: Rogene Houston, MD;  [facility];  Service: Endoscopy;  Laterality: N/A;  . 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 TUBAL LIGATION          Social History         Tobacco Use  . Smoking status: Current Every Day Smoker      Packs/day: 0.50      Years: 11.00      Pack years: 5.50      Types: Cigarettes  . Smokeless tobacco: Never Used  Substance Use Topics  . Alcohol use: Yes      Alcohol/week: 0.0 standard drinks      Comment: occasionally  . Drug use: No  Family History  Problem Relation Age of Onset  . Stroke Mother 39  . Heart attack Mother 29  . Cancer Sister          colon  . Heart failure Maternal Grandmother 65  . Cancer Maternal Grandfather          prostate  . Heart failure Paternal Grandmother 18  . Heart failure Paternal Grandfather 60             Allergies  Allergen Reactions  . Zocor [Simvastatin] Other (See Comments)      Muscle aches and pains  . Clindamycin/Lincomycin Rash  . Codeine Rash  . Penicillins Rash      Has patient had a PCN reaction causing immediate rash, facial/tongue/throat swelling, SOB or lightheadedness with hypotension: Yes Has patient had a PCN reaction causing severe rash involving mucus membranes or skin necrosis: Yes Has patient had a PCN reaction that required hospitalization: Yes Has patient had a PCN reaction occurring within the last 10 years: No If all of the above answers are "NO", then may proceed with Cephalosporin use.    . Sulfa Antibiotics Rash                  Current Outpatient Medications:  .  aspirin EC 81 MG tablet, Take 1 tablet (81 mg total) by mouth daily., Disp: 90 tablet, Rfl: 3 .  Calcium Polycarbophil (FIBERCON PO), Take 500 mg by mouth daily., Disp: , Rfl:  .  Continuous Blood Gluc Sensor (FREESTYLE LIBRE 14 DAY SENSOR) MISC, 1 each by Does not apply route every 14 (fourteen) days., Disp: 2 each, Rfl: 2 .  diazepam (VALIUM) 10 MG tablet, Take 1 tablet by mouth every 8 (eight) hours as needed. , Disp: , Rfl:  .  docusate sodium (COLACE) 100 MG capsule, Take 100 mg by mouth 2 (two) times daily., Disp: , Rfl:  .  EPIPEN 2-PAK 0.3 MG/0.3ML SOAJ injection, Inject 0.3 mg into the muscle once. , Disp: , Rfl:  .  estradiol (ESTRACE) 1 MG tablet, Take 1 mg by mouth daily., Disp: , Rfl:  .  GLUCAGEN HYPOKIT 1 MG SOLR injection, , Disp: , Rfl:  .  insulin aspart (NOVOLOG) 100 UNIT/ML injection, Inject 62 units via pump daily, Disp: 6 vial, Rfl: 2 .  LYRICA 150 MG capsule, Take 150 mg by mouth 2 (two) times daily. , Disp: , Rfl:  .  Melatonin 5 MG TABS, , Disp: , Rfl:  .  omega-3 fish oil (MAXEPA) 1000 MG CAPS capsule, , Disp: , Rfl:  .  pravastatin (PRAVACHOL) 10 MG tablet, Take 10 mg by mouth at bedtime., Disp: , Rfl: 2 .  tiZANidine (ZANAFLEX) 4 MG tablet, Take 4 mg by mouth 2 (two) times daily., Disp: , Rfl:  .  vitamin B-12 (CYANOCOBALAMIN) 1000 MCG tablet, Take 2,000 mcg by mouth daily., Disp: , Rfl:  .  cetirizine (ZYRTEC) 10 MG tablet, , Disp: , Rfl:  .  cyclobenzaprine (FLEXERIL) 10 MG tablet, Take by mouth., Disp: , Rfl:  .  dimenhyDRINATE (DRAMAMINE) 50 MG tablet, , Disp: , Rfl:  .  DULoxetine (CYMBALTA) 30 MG capsule, , Disp: , Rfl:  .  insulin aspart (NOVOLOG FLEXPEN) 100 UNIT/ML FlexPen, Inject 10 Units into the skin 3 (three) times daily with meals. SLIDING SCALE (Patient not taking: Reported on 08/16/2018), Disp: 15 mL, Rfl: 2 .  Insulin Glargine (LANTUS SOLOSTAR) 100 UNIT/ML Solostar Pen, Inject under skin 20 units in am and 16 units  at bedtime (Patient not taking: Reported on 08/16/2018), Disp: 5 pen, Rfl: 11 .  insulin lispro (HUMALOG) 100 UNIT/ML injection, 10-20 units 3 times a day, Disp: , Rfl:  .  lisinopril (PRINIVIL,ZESTRIL) 5 MG tablet, Take 5 mg by mouth daily., Disp: , Rfl: 2     PHYSICAL EXAM SECTION: BP 105/74   Pulse 75   Ht 5\' 7"  (1.702 m)   Wt 158 lb (71.7 kg)   BMI 24.75 kg/m   Body mass index is 24.75 kg/m. General appearance: Well-developed well-nourished no gross deformities   Eyes clear normal vision no evidence of conjunctivitis or jaundice, extraocular muscles intact   ENT: ears hearing normal, nasal passages clear, throat clear   Lymph nodes: No lymphadenopathy or left upper extremity   Neck is supple without palpable mass, full range of motion no thyroid enlargement or tracheal deviation  Cardiovascular normal pulse and perfusion in all 4 extremities normal color without edema  Neurologically deep tendon reflexes are equal and normal,  no pathologic reflexes   Psychological: Awake alert and oriented x3 mood and affect normal   Skin no lacerations or ulcerations no nodularity no palpable masses, no erythema or nodularity   Musculoskeletal:  Right and left upper extremity Normal appearance of both upper extremities including the hands with some tightness noted in both upper extremities. Sensory loss is noted in the median nerve distribution and also somewhat into portions of the small finger Grip strength is equal though diminished bilaterally Color and capillary refill are normal Tenderness is noted over the carpal tunnel with a positive carpal tunnel compression test positive Phalen's test at 10 seconds bilaterally Both wrist joints are stable no deformities are noted in either extremity         MEDICAL DECISION SECTION:      Encounter Diagnoses  Name Primary?  . Carpal tunnel syndrome of right wrist Yes  . Left carpal tunnel syndrome        Imaging Nerve conduction  study   Notes from Dr. 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   She is advised about tenderness over the incision for up to 6 months.   The procedure has been fully reviewed with the patient; The risks and benefits of surgery have been discussed and explained and understood. Alternative treatment has also been reviewed, questions were encouraged and answered. The postoperative plan is also been reviewed.   Right CTR

## 2018-08-22 NOTE — Progress Notes (Addendum)
Patient ID: [name], female   DOB: Jan 01, 1971, 48 y.o.   MRN: [id number]  HPI: [name] is a 48 y.o.-year-old female, returning for f/u for DM1, dx in 16 (48 y/o), uncontrolled, with complications (cerebro-vascular ds - h/o TIA 0/2016, PN). Last visit 4 months ago. Insurance: [employer].  She had this test done with surgery since last visit.  She is now preparing for carpal tunnel release surgery.  Her sugars are higher after her insurance switched her from Humalog to NovoLog.  Last hemoglobin A1c was: Lab Results  Component Value Date   HGBA1C 8.9 (H) 08/18/2018   HGBA1C 9.9 (A) 04/21/2018   HGBA1C 9.9 04/21/2018   HGBA1C 9.9 (A) 04/21/2018   HGBA1C 9.9 (A) 04/21/2018   She has been on insulin pump before.  She had problems with [employer] and had to come off her OmniPod, but she restarted the pump (now Medtronic 670G  w/o CGM as this is not affordable) since last visit.  At last visit we changed to: - Lantus 20 units in am and 16 units at night - Novolog: - 10-12 units before a breakfast - 6-10 units before lunch - 14-16 units before dinner - Sliding scale Novolog:  150-200: + 1 unit 201-250: + 2 units 251-300: + 3 units >301: + 4 units  Current regimen: - Pump settings:  - basal rates: 12 am: 0.85 >> 0.70 units/h 8 am: 0.75 >> 0.80 12 pm: 0.85 >> 0.80 4 pm: 0.75 >> 0.80 6  pm: 0.85 >> 0.80 - ICR:   12 am: 8 >> 9.0  1 pm: 7 >> 9.0 - target: 110-120 >> 130-130 - ISF: 70 >> 45 - Insulin on Board: 4h - bolus wizard: on  TDD from basal insulin: 62% (18 units) TDD from bolus insulin: 38% (11 units) - extended bolusing: not using - changes infusion site: q3 days - Meter: Omnipod meter  Checks sugars >4x a day - Freestyle Libre CGM now:  [employer] CGM parameters: - average: 234+/-68 >> 211 +/- 77.7% - coefficient of variation: 29.1 >> 36.8% (19-25%) - time in range:  - low (<70): 1% >> 2% - normal (70-180): 19% >> 33% - high (>180):  80% >> 65%   Lowest sugar was 27 in 07/2014... >> 42 >> 43 (after correction of a high blood sugar at night) >> 48 (Libre); she has no hypoglycemia awareness! Highest sugar was 601 >> 400s >> 300s >> >350. No history of DKA or hypoglycemia admissions.  He did have hyperglycemia ED visits and an admission in 12/2017.  -No CKD, last BUN/creatinine:  Lab Results  Component Value Date   BUN 7 08/18/2018   CREATININE 0.73 08/18/2018   -+ HL; last set of lipids: Lab Results  Component Value Date   CHOL 171 04/20/2018   HDL 46.60 04/20/2018   LDLCALC 105 (H) 04/20/2018   TRIG 99.0 04/20/2018   CHOLHDL 4 04/20/2018  On Zocor - last eye exam was in 2019: No DR. - + numbness and tingling in her feet.  She was previously on Neurontin but she stopped due to side effects.  Currently on Lyrica.  No hypothyroidism: Lab Results  Component Value Date   TSH 1.63 04/20/2018   On Diazepam. Off Wellbutrin because of constipation.  She had tooth surgery in 01/2018, Dr. Britta Mccreedy.  She also had bilateral big toe surgery in 09/2016 with Dr. Amalia Hailey.  ROS: Constitutional: + Fatigue, + weight loss, + hot flashes, + urinary incontinence (enuresis)  Eyes: no blurry vision, no xerophthalmia ENT: no sore throat, no nodules palpated in neck, no dysphagia, no odynophagia, no hoarseness Cardiovascular: no CP/no SOB/no palpitations/no leg swelling Respiratory: no cough/no SOB/no wheezing Gastrointestinal: + N/no V/no D/no C/no acid reflux Musculoskeletal: + Muscle aches/+ joint aches Skin: no rashes, no hair loss Neurological: no tremors/+ numbness/+ tingling/no dizziness  I reviewed pt's medications, allergies, PMH, social hx, family hx, and changes were documented in the history of present illness. Otherwise, unchanged from my initial visit note. Lyrica increased to 200 mg twice a day.  She stopped ASA 81 mg since last visit.  Past Medical History:  Diagnosis Date  . Benign paroxysmal vertigo   .  Cervicalgia   . Depression   . Diabetes ([place])    Type1  . Diabetes mellitus without complication ([place])   . GERD (gastroesophageal reflux disease)   . Hiatal hernia   . History of cardiac catheterization    Normal coronary arteries 2016  . History of TIA (transient ischemic attack)   . Hyperlipidemia   . Hypertension   . IBS (irritable bowel syndrome)   . Migraine headache   . Panic disorder with agoraphobia   . Peripheral neuropathy   . Subungual hematoma of digit of hand   . Tendonitis   . Vertigo    Past Surgical History:  Procedure Laterality Date  . ABDOMINAL HYSTERECTOMY    . APPENDECTOMY    . CARDIAC CATHETERIZATION   last in 2009   x 4, normal coronary arteries  . CARDIAC CATHETERIZATION N/A 03/26/2015   Procedure: Left Heart Cath and Coronary Angiography;  Surgeon: Wellington Hampshire, MD;  Location: [place] [facility];  Service: Cardiovascular;  Laterality: N/A;  . CESAREAN SECTION    . CHOLECYSTECTOMY    . COLONOSCOPY WITH PROPOFOL N/A 01/09/2018   Procedure: COLONOSCOPY WITH PROPOFOL;  Surgeon: Rogene Houston, MD;  [facility];  Service: Endoscopy;  Laterality: N/A;  . CYST EXCISION     right breast  . ESOPHAGOGASTRODUODENOSCOPY (EGD) WITH PROPOFOL N/A 01/09/2018   Procedure: ESOPHAGOGASTRODUODENOSCOPY (EGD) WITH PROPOFOL;  Surgeon: Rogene Houston, MD;  [facility];  Service: Endoscopy;  Laterality: N/A;  . 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 TUBAL LIGATION     History   Social History  . Marital Status: Divorced    Spouse Name: N/A  . Number of Children: 1   Occupational History  . disabled   Social History Main Topics  . Smoking status: Current Every Day Smoker -- 0.50 packs/day for 11 years  . Smokeless tobacco: Never Used      Comment: patient is aware that she needs to quit smoking  . Alcohol Use: No  . Drug Use: No   Current Outpatient Medications on File Prior to Visit  Medication Sig Dispense Refill  . aspirin EC 81 MG tablet Take 1 tablet (81 mg total) by mouth daily. (Patient not taking: Reported on 08/17/2018) 90 tablet 3  . Calcium Polycarbophil (FIBERCON PO) Take 1,000 mg by mouth daily.     . cholecalciferol (VITAMIN D3) 25 MCG (1000 UT) tablet Take 1,000 Units by mouth daily.    . Continuous Blood Gluc Sensor (FREESTYLE LIBRE 14 DAY SENSOR) MISC 1 each by Does not apply route every 14 (fourteen) days. 2  each 2  . diazepam (VALIUM) 10 MG tablet Take 10 mg by mouth every 8 (eight) hours as needed for anxiety.     . dimenhyDRINATE (DRAMAMINE) 50 MG tablet Take 50 mg by mouth every 8 (eight) hours as needed for nausea.     Marland Kitchen docusate sodium (COLACE) 100 MG capsule Take 100-200 mg by mouth See admin instructions. Take 100 mg by mouth in the morning and 200 mg in the evening    . EPIPEN 2-PAK 0.3 MG/0.3ML SOAJ injection Inject 0.3 mg into the muscle once.     Marland Kitchen estradiol (ESTRACE) 1 MG tablet Take 1 mg by mouth daily.    Marland Kitchen GLUCAGEN HYPOKIT 1 MG SOLR injection Inject 1 mg into the skin once as needed for low blood sugar.     . ibuprofen (ADVIL,MOTRIN) 800 MG tablet Take 800 mg by mouth every 8 (eight) hours as needed for moderate pain.    Marland Kitchen insulin aspart (NOVOLOG FLEXPEN) 100 UNIT/ML FlexPen Inject 10 Units into the skin 3 (three) times daily with meals. SLIDING SCALE (Patient not taking: Reported on 08/16/2018) 15 mL 2  . insulin aspart (NOVOLOG) 100 UNIT/ML injection Inject 62 units via pump daily (Patient taking differently: Inject 30-40 Units into the skin daily. Inject 30-40 units via pump daily) 6 vial 2  . Insulin Glargine (LANTUS SOLOSTAR) 100 UNIT/ML Solostar Pen Inject under skin 20 units in am and 16 units at bedtime (Patient not taking: Reported on 08/16/2018) 5 pen 11  . lisinopril (PRINIVIL,ZESTRIL) 5  MG tablet Take 5 mg by mouth daily.  2  . LYRICA 200 MG capsule Take 200 mg by mouth 2 (two) times daily.     . Melatonin 5 MG TABS Take 10 mg by mouth at bedtime as needed (sleep).     . Multiple Vitamin (MULTIVITAMIN WITH MINERALS) TABS tablet Take 1 tablet by mouth daily.    Marland Kitchen omega-3 fish oil (MAXEPA) 1000 MG CAPS capsule Take 1 capsule by mouth daily.     . pravastatin (PRAVACHOL) 10 MG tablet Take 10 mg by mouth at bedtime.  2  . tiZANidine (ZANAFLEX) 4 MG tablet Take 4 mg by mouth every 8 (eight) hours as needed for muscle spasms.     . vitamin B-12 (CYANOCOBALAMIN) 1000 MCG tablet Take 2,000 mcg by mouth daily.     No current facility-administered medications on file prior to visit.    Allergies  Allergen Reactions  . Zocor [Simvastatin] Other (See Comments)    Muscle aches and pains  . Clindamycin/Lincomycin Rash  . Codeine Rash  . Penicillins Rash    Has patient had a PCN reaction causing immediate rash, facial/tongue/throat swelling, SOB or lightheadedness with hypotension: Yes Has patient had a PCN reaction causing severe rash involving mucus membranes or skin necrosis: Yes Has patient had a PCN reaction that required hospitalization: Yes Has patient had a PCN reaction occurring within the last 10 years: No If all of the above answers are "NO", then may proceed with Cephalosporin use.   . Sulfa Antibiotics Rash        Family History  Problem Relation Age of Onset  . Stroke Mother 74  . Heart attack Mother 83  . Cancer Sister        colon  . Heart failure Maternal Grandmother 65  . Cancer Maternal Grandfather        prostate  . Heart failure Paternal Grandmother 43  . Heart failure Paternal Grandfather 60   PE: BP  120/70   Pulse 87   Ht 5\' 7"  (1.702 m)   Wt 161 lb (73 kg)   SpO2 98%   BMI 25.22 kg/m  Body mass index is 25.22 kg/m. Wt Readings from Last 3 Encounters:  08/18/18 157 lb (71.2 kg)  08/16/18 158 lb (71.7 kg)  05/03/18 167 lb 6.4 oz (75.9 kg)     Constitutional: normal weight, in NAD Eyes: PERRLA, EOMI, no exophthalmos ENT: moist mucous membranes, no thyromegaly, no cervical lymphadenopathy Cardiovascular: RRR, No MRG Respiratory: CTA B Gastrointestinal: abdomen soft, NT, ND, BS+ Musculoskeletal: no deformities, strength intact in all 4 Skin: moist, warm, no rashes Neurological: no tremor with outstretched hands, DTR normal in all 4  ASSESSMENT: 1. DM1, insulin-dependent, uncontrolled, with complications - cerebro-vascular ds - h/o TIA 0/2016 - PN Sees cardiology >> Dr, Sharlotte Alamo [facility] - [facility]  PLAN:  1. Patient with longstanding, uncontrolled, type 1 diabetes, previously on an Omnipod insulin pump, however, she had to come off as her  insurance did not cover the pump anymore.  At last visit she was on basal-bolus insulin regimen, but since then, she started on the Medtronic 670 G insulin pump.  She does not have the CGM as this was too expensive.  Her basal rates were adjusted by the diabetes educator. -At this visit, sugars are still high especially after dinner and they remain high during the night.  Day finally decrease after breakfast.  The reason for this appears to be that she is not introducing enough carbs into the pump, occasionally, she only introduces a small amount of carbs once a day.  We discussed that she absolutely needs to introduce all the carbs that she eats with every meal, introduced a blood sugar before the meal and start to bolus 15 minutes before the meal.  And if she starts doing this, adjusting her pump settings would be almost impossible.  However, since she is still high overnight, we will increase the basal rate for this period of time.  I would not make any other changes in her regimen, however. - I suggested to:  Patient Instructions  Please increase: - basal rates: 12 am: 0.70 units/h >> 0.80 8 am: 0.80 - ICR:   12 am: 9   1 pm: 9 - target: 130-130 - ISF: 45 - Insulin on Board: 4h -  bolus wizard: on  Please do the following approximately 15 minutes before every meal: - Enter carbs (C) - Enter sugars (S) - Start insulin bolus (I)  Please return in 3-4 months.  - today, HbA1c is 8.5% (improving) - continue checking sugars at different times of the day - check 4x a day, rotating checks - advised for yearly eye exams >> she is UTD - Return to clinic in 3-4 mo with sugar log    - time spent with the patient: 25 min, of which >50% was spent in reviewing her pump and CGM downloads, discussing her hypo- and hyper-glycemic episodes, reviewing previous labs and pump settings and developing a plan to avoid hypo- and hyper-glycemia.   Philemon Kingdom, MD PhD [facility]

## 2018-08-23 NOTE — H&P (Signed)
[name]   08/16/2018   HISTORY SECTION :          Chief Complaint  Patient presents with  . Hand Pain      right worse than left     HPI The patient presents for evaluation of lateral carpal tunnel syndrome   Patient referred to [place] by Dr. Merlene Laughter neurology   Complains of bilateral numbness and tingling of both upper extremities with weakness associated with dropping things from her hand.  Symptoms been present now for several months did not respond to brace therapy she notices tightness in her fingers as well and perceives numbness and tingling in all 5 digits.  She experiences nocturnal symptoms and moderate to severe pain     Review of Systems  Neurological: Positive for tingling, sensory change and weakness.  All other systems reviewed and are negative.            Past Medical History:  Diagnosis Date  . Benign paroxysmal vertigo    . Cervicalgia    . Depression    . GERD (gastroesophageal reflux disease)    . Hiatal hernia    . History of cardiac catheterization      Normal coronary arteries 2016  . History of TIA (transient ischemic attack)    . Hyperlipidemia    . Hypertension    . IBS (irritable bowel syndrome)    . Migraine headache    . Panic disorder with agoraphobia    . Peripheral neuropathy    . Subungual hematoma of digit of hand    . Tendonitis    . Type 2 diabetes mellitus ([place])    . Vertigo                 Past Surgical History:  Procedure Laterality Date  . ABDOMINAL HYSTERECTOMY      . APPENDECTOMY      . CARDIAC CATHETERIZATION    last in 2009    x 4, normal coronary arteries  . CARDIAC CATHETERIZATION N/A 03/26/2015    Procedure: Left Heart Cath and Coronary Angiography;  Surgeon: Wellington Hampshire, MD;  Location: [facility];  Service: Cardiovascular;  Laterality: N/A;  . CESAREAN SECTION      . CHOLECYSTECTOMY      . COLONOSCOPY WITH PROPOFOL N/A 01/09/2018    Procedure: COLONOSCOPY WITH PROPOFOL;  Surgeon: Rogene Houston, MD;  Location: [facility];  Service: Endoscopy;  Laterality: N/A;  . CYST EXCISION        right breast  . ESOPHAGOGASTRODUODENOSCOPY (EGD) WITH PROPOFOL N/A 01/09/2018    Procedure: ESOPHAGOGASTRODUODENOSCOPY (EGD) WITH PROPOFOL;  Surgeon: Rogene Houston, MD;  Location: [facility];  Service: Endoscopy;  Laterality: N/A;  . 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 TUBAL LIGATION          Social History             Tobacco Use  . Smoking status: Current Every Day Smoker      Packs/day: 0.50      Years: 11.00      Pack years: 5.50      Types: Cigarettes  . Smokeless tobacco: Never Used  Substance Use Topics  . Alcohol use: Yes      Alcohol/week: 0.0 standard drinks  Comment: occasionally  . Drug use: No             Family History  Problem Relation Age of Onset  . Stroke Mother 75  . Heart attack Mother 53  . Cancer Sister          colon  . Heart failure Maternal Grandmother 65  . Cancer Maternal Grandfather          prostate  . Heart failure Paternal Grandmother 6  . Heart failure Paternal Grandfather 60                 Allergies  Allergen Reactions  . Zocor [Simvastatin] Other (See Comments)      Muscle aches and pains  . Clindamycin/Lincomycin Rash  . Codeine Rash  . Penicillins Rash      Has patient had a PCN reaction causing immediate rash, facial/tongue/throat swelling, SOB or lightheadedness with hypotension: Yes Has patient had a PCN reaction causing severe rash involving mucus membranes or skin necrosis: Yes Has patient had a PCN reaction that required hospitalization: Yes Has patient had a PCN reaction occurring within the last 10 years: No If all of the above answers are "NO", then may proceed with Cephalosporin use.    . Sulfa Antibiotics  Rash                 Current Outpatient Medications:  .  aspirin EC 81 MG tablet, Take 1 tablet (81 mg total) by mouth daily., Disp: 90 tablet, Rfl: 3 .  Calcium Polycarbophil (FIBERCON PO), Take 500 mg by mouth daily., Disp: , Rfl:  .  Continuous Blood Gluc Sensor (FREESTYLE LIBRE 14 DAY SENSOR) MISC, 1 each by Does not apply route every 14 (fourteen) days., Disp: 2 each, Rfl: 2 .  diazepam (VALIUM) 10 MG tablet, Take 1 tablet by mouth every 8 (eight) hours as needed. , Disp: , Rfl:  .  docusate sodium (COLACE) 100 MG capsule, Take 100 mg by mouth 2 (two) times daily., Disp: , Rfl:  .  EPIPEN 2-PAK 0.3 MG/0.3ML SOAJ injection, Inject 0.3 mg into the muscle once. , Disp: , Rfl:  .  estradiol (ESTRACE) 1 MG tablet, Take 1 mg by mouth daily., Disp: , Rfl:  .  GLUCAGEN HYPOKIT 1 MG SOLR injection, , Disp: , Rfl:  .  insulin aspart (NOVOLOG) 100 UNIT/ML injection, Inject 62 units via pump daily, Disp: 6 vial, Rfl: 2 .  LYRICA 150 MG capsule, Take 150 mg by mouth 2 (two) times daily. , Disp: , Rfl:  .  Melatonin 5 MG TABS, , Disp: , Rfl:  .  omega-3 fish oil (MAXEPA) 1000 MG CAPS capsule, , Disp: , Rfl:  .  pravastatin (PRAVACHOL) 10 MG tablet, Take 10 mg by mouth at bedtime., Disp: , Rfl: 2 .  tiZANidine (ZANAFLEX) 4 MG tablet, Take 4 mg by mouth 2 (two) times daily., Disp: , Rfl:  .  vitamin B-12 (CYANOCOBALAMIN) 1000 MCG tablet, Take 2,000 mcg by mouth daily., Disp: , Rfl:  .  cetirizine (ZYRTEC) 10 MG tablet, , Disp: , Rfl:  .  cyclobenzaprine (FLEXERIL) 10 MG tablet, Take by mouth., Disp: , Rfl:  .  dimenhyDRINATE (DRAMAMINE) 50 MG tablet, , Disp: , Rfl:  .  DULoxetine (CYMBALTA) 30 MG capsule, , Disp: , Rfl:  .  insulin aspart (NOVOLOG FLEXPEN) 100 UNIT/ML FlexPen, Inject 10 Units into the skin 3 (three) times daily with meals. SLIDING SCALE (Patient not taking: Reported on 08/16/2018), Disp: 15  mL, Rfl: 2 .  Insulin Glargine (LANTUS SOLOSTAR) 100 UNIT/ML Solostar Pen, Inject under skin 20  units in am and 16 units at bedtime (Patient not taking: Reported on 08/16/2018), Disp: 5 pen, Rfl: 11 .  insulin lispro (HUMALOG) 100 UNIT/ML injection, 10-20 units 3 times a day, Disp: , Rfl:  .  lisinopril (PRINIVIL,ZESTRIL) 5 MG tablet, Take 5 mg by mouth daily., Disp: , Rfl: 2     PHYSICAL EXAM SECTION: BP 105/74   Pulse 75   Ht 5\' 7"  (1.702 m)   Wt 158 lb (71.7 kg)   BMI 24.75 kg/m   Body mass index is 24.75 kg/m. General appearance: Well-developed well-nourished no gross deformities   Eyes clear normal vision no evidence of conjunctivitis or jaundice, extraocular muscles intact   ENT: ears hearing normal, nasal passages clear, throat clear   Lymph nodes: No lymphadenopathy or left upper extremity   Neck is supple without palpable mass, full range of motion no thyroid enlargement or tracheal deviation  Cardiovascular normal pulse and perfusion in all 4 extremities normal color without edema  Neurologically deep tendon reflexes are equal and normal,  no pathologic reflexes   Psychological: Awake alert and oriented x3 mood and affect normal   Skin no lacerations or ulcerations no nodularity no palpable masses, no erythema or nodularity   Musculoskeletal:  Right and left upper extremity Normal appearance of both upper extremities including the hands with some tightness noted in both upper extremities. Sensory loss is noted in the median nerve distribution and also somewhat into portions of the small finger Grip strength is equal though diminished bilaterally Color and capillary refill are normal Tenderness is noted over the carpal tunnel with a positive carpal tunnel compression test positive Phalen's test at 10 seconds bilaterally Both wrist joints are stable no deformities are noted in either extremity         MEDICAL DECISION SECTION:         Encounter Diagnoses  Name Primary?  . Carpal tunnel syndrome of right wrist Yes  . Left carpal tunnel syndrome         Imaging Nerve conduction study   Notes from Dr. 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   She is advised about tenderness over the incision for up to 6 months.   The procedure has been fully reviewed with the patient; The risks and benefits of surgery have been discussed and explained and understood. Alternative treatment has also been reviewed, questions were encouraged and answered. The postoperative plan is also been reviewed.   Right CTR

## 2018-08-24 ENCOUNTER — Telehealth: Payer: Self-pay | Admitting: Orthopedic Surgery

## 2018-08-24 ENCOUNTER — Ambulatory Visit (HOSPITAL_COMMUNITY)
Admission: RE | Admit: 2018-08-24 | Discharge: 2018-08-24 | Disposition: A | Discharge status: Home / Self Care | Payer: Medicare HMO | Attending: Orthopedic Surgery | Admitting: Orthopedic Surgery

## 2018-08-24 ENCOUNTER — Encounter (HOSPITAL_COMMUNITY): Payer: Self-pay | Admitting: *Deleted

## 2018-08-24 ENCOUNTER — Ambulatory Visit (HOSPITAL_COMMUNITY): Payer: Medicare HMO | Admitting: Anesthesiology

## 2018-08-24 ENCOUNTER — Ambulatory Visit: Payer: Medicare HMO | Admitting: Internal Medicine

## 2018-08-24 ENCOUNTER — Encounter (HOSPITAL_COMMUNITY)
Admission: RE | Disposition: A | Discharge status: Home / Self Care | Payer: Self-pay | Source: Home / Self Care | Attending: Orthopedic Surgery

## 2018-08-24 DIAGNOSIS — Z794 Long term (current) use of insulin: Secondary | ICD-10-CM | POA: Diagnosis not present

## 2018-08-24 DIAGNOSIS — G5603 Carpal tunnel syndrome, bilateral upper limbs: Secondary | ICD-10-CM | POA: Diagnosis not present

## 2018-08-24 DIAGNOSIS — Z79899 Other long term (current) drug therapy: Secondary | ICD-10-CM | POA: Diagnosis not present

## 2018-08-24 DIAGNOSIS — Z7982 Long term (current) use of aspirin: Secondary | ICD-10-CM | POA: Diagnosis not present

## 2018-08-24 DIAGNOSIS — F17210 Nicotine dependence, cigarettes, uncomplicated: Secondary | ICD-10-CM | POA: Diagnosis not present

## 2018-08-24 DIAGNOSIS — Z8673 Personal history of transient ischemic attack (TIA), and cerebral infarction without residual deficits: Secondary | ICD-10-CM | POA: Diagnosis not present

## 2018-08-24 DIAGNOSIS — E785 Hyperlipidemia, unspecified: Secondary | ICD-10-CM | POA: Insufficient documentation

## 2018-08-24 DIAGNOSIS — Z79890 Hormone replacement therapy (postmenopausal): Secondary | ICD-10-CM | POA: Insufficient documentation

## 2018-08-24 DIAGNOSIS — K219 Gastro-esophageal reflux disease without esophagitis: Secondary | ICD-10-CM | POA: Diagnosis not present

## 2018-08-24 DIAGNOSIS — I10 Essential (primary) hypertension: Secondary | ICD-10-CM | POA: Insufficient documentation

## 2018-08-24 DIAGNOSIS — E1142 Type 2 diabetes mellitus with diabetic polyneuropathy: Secondary | ICD-10-CM | POA: Insufficient documentation

## 2018-08-24 DIAGNOSIS — F329 Major depressive disorder, single episode, unspecified: Secondary | ICD-10-CM | POA: Insufficient documentation

## 2018-08-24 DIAGNOSIS — G5601 Carpal tunnel syndrome, right upper limb: Secondary | ICD-10-CM | POA: Diagnosis not present

## 2018-08-24 DIAGNOSIS — F17200 Nicotine dependence, unspecified, uncomplicated: Secondary | ICD-10-CM | POA: Diagnosis not present

## 2018-08-24 DIAGNOSIS — F410 Panic disorder [episodic paroxysmal anxiety] without agoraphobia: Secondary | ICD-10-CM | POA: Insufficient documentation

## 2018-08-24 HISTORY — PX: CARPAL TUNNEL RELEASE: SHX101

## 2018-08-24 LAB — GLUCOSE, CAPILLARY
GLUCOSE-CAPILLARY: 225 mg/dL — AB (ref 70–99)
Glucose-Capillary: 253 mg/dL — ABNORMAL HIGH (ref 70–99)

## 2018-08-24 SURGERY — CARPAL TUNNEL RELEASE
Anesthesia: Monitor Anesthesia Care | Laterality: Right

## 2018-08-24 MED ORDER — MIDAZOLAM HCL 2 MG/2ML IJ SOLN
INTRAMUSCULAR | Status: AC
  Filled 2018-08-24: qty 2

## 2018-08-24 MED ORDER — VANCOMYCIN HCL IN DEXTROSE 1-5 GM/200ML-% IV SOLN
1000.0000 mg | INTRAVENOUS | Status: AC
  Administered 2018-08-24: 1000 mg via INTRAVENOUS

## 2018-08-24 MED ORDER — HYDROCODONE-ACETAMINOPHEN 7.5-325 MG PO TABS
1.0000 | ORAL_TABLET | Freq: Once | ORAL | Status: AC | PRN
  Administered 2018-08-24: 1 via ORAL
  Filled 2018-08-24: qty 1

## 2018-08-24 MED ORDER — HYDROMORPHONE HCL 1 MG/ML IJ SOLN: 0.2500 mg | INTRAMUSCULAR | Status: DC | PRN

## 2018-08-24 MED ORDER — PROPOFOL 10 MG/ML IV BOLUS
INTRAVENOUS | Status: DC | PRN
  Administered 2018-08-24: 15 mg via INTRAVENOUS

## 2018-08-24 MED ORDER — ONDANSETRON HCL 4 MG/2ML IJ SOLN
INTRAMUSCULAR | Status: AC
  Filled 2018-08-24: qty 2

## 2018-08-24 MED ORDER — VANCOMYCIN HCL IN DEXTROSE 1-5 GM/200ML-% IV SOLN
INTRAVENOUS | Status: AC
  Filled 2018-08-24: qty 200

## 2018-08-24 MED ORDER — LIDOCAINE HCL (PF) 0.5 % IJ SOLN
INTRAMUSCULAR | Status: AC
  Filled 2018-08-24: qty 50

## 2018-08-24 MED ORDER — 0.9 % SODIUM CHLORIDE (POUR BTL) OPTIME
TOPICAL | Status: DC | PRN
  Administered 2018-08-24: 1000 mL

## 2018-08-24 MED ORDER — BUPIVACAINE HCL (PF) 0.5 % IJ SOLN
INTRAMUSCULAR | Status: AC
  Filled 2018-08-24: qty 30

## 2018-08-24 MED ORDER — KETOROLAC TROMETHAMINE 30 MG/ML IJ SOLN: 30.0000 mg | Freq: Once | INTRAMUSCULAR | Status: DC | PRN

## 2018-08-24 MED ORDER — FENTANYL CITRATE (PF) 100 MCG/2ML IJ SOLN
INTRAMUSCULAR | Status: AC
  Filled 2018-08-24: qty 2

## 2018-08-24 MED ORDER — PROPOFOL 500 MG/50ML IV EMUL
INTRAVENOUS | Status: DC | PRN
  Administered 2018-08-24: 35 ug/kg/min via INTRAVENOUS

## 2018-08-24 MED ORDER — MIDAZOLAM HCL 5 MG/5ML IJ SOLN
INTRAMUSCULAR | Status: DC | PRN
  Administered 2018-08-24: 2 mg via INTRAVENOUS

## 2018-08-24 MED ORDER — FENTANYL CITRATE (PF) 100 MCG/2ML IJ SOLN
INTRAMUSCULAR | Status: DC | PRN
  Administered 2018-08-24 (×2): 50 ug via INTRAVENOUS

## 2018-08-24 MED ORDER — LIDOCAINE HCL (PF) 0.5 % IJ SOLN
INTRAMUSCULAR | Status: DC | PRN
  Administered 2018-08-24: 250 mg via INTRAVENOUS

## 2018-08-24 MED ORDER — MEPERIDINE HCL 50 MG/ML IJ SOLN: 6.2500 mg | INTRAMUSCULAR | Status: DC | PRN

## 2018-08-24 MED ORDER — LACTATED RINGERS IV SOLN
INTRAVENOUS | Status: DC
  Administered 2018-08-24: 1000 mL via INTRAVENOUS

## 2018-08-24 MED ORDER — BUPIVACAINE HCL (PF) 0.5 % IJ SOLN
INTRAMUSCULAR | Status: DC | PRN
  Administered 2018-08-24: 10 mL

## 2018-08-24 MED ORDER — CHLORHEXIDINE GLUCONATE 4 % EX LIQD: 60.0000 mL | Freq: Once | CUTANEOUS | Status: DC

## 2018-08-24 MED ORDER — OXYCODONE-ACETAMINOPHEN 10-325 MG PO TABS: 1.0000 | ORAL_TABLET | Freq: Four times a day (QID) | ORAL | 0 refills | Status: AC | PRN

## 2018-08-24 MED ORDER — PROPOFOL 10 MG/ML IV BOLUS
INTRAVENOUS | Status: AC
  Filled 2018-08-24: qty 20

## 2018-08-24 MED ORDER — ONDANSETRON HCL 4 MG/2ML IJ SOLN
4.0000 mg | Freq: Once | INTRAMUSCULAR | Status: AC | PRN
  Administered 2018-08-24: 4 mg via INTRAVENOUS

## 2018-08-24 SURGICAL SUPPLY — 40 items
BANDAGE ELASTIC 3 LF NS (GAUZE/BANDAGES/DRESSINGS) ×2 IMPLANT
BANDAGE ESMARK 4X12 BL STRL LF (DISPOSABLE) ×1 IMPLANT
BLADE SURG 15 STRL LF DISP TIS (BLADE) ×1 IMPLANT
BLADE SURG 15 STRL SS (BLADE) ×1
BNDG COHESIVE 4X5 TAN STRL (GAUZE/BANDAGES/DRESSINGS) ×2 IMPLANT
BNDG ESMARK 4X12 BLUE STRL LF (DISPOSABLE) ×2
BNDG GAUZE ELAST 4 BULKY (GAUZE/BANDAGES/DRESSINGS) ×2 IMPLANT
CHLORAPREP W/TINT 26ML (MISCELLANEOUS) ×2 IMPLANT
CLOTH BEACON ORANGE TIMEOUT ST (SAFETY) ×2 IMPLANT
COVER LIGHT HANDLE STERIS (MISCELLANEOUS) ×4 IMPLANT
COVER WAND RF STERILE (DRAPES) ×1 IMPLANT
CUFF TOURNIQUET SINGLE 18IN (TOURNIQUET CUFF) ×2 IMPLANT
DECANTER SPIKE VIAL GLASS SM (MISCELLANEOUS) ×2 IMPLANT
DRAPE HALF SHEET 40X57 (DRAPES) ×1 IMPLANT
DRAPE PROXIMA HALF (DRAPES) ×2 IMPLANT
DRSG XEROFORM 1X8 (GAUZE/BANDAGES/DRESSINGS) ×2 IMPLANT
ELECT NDL TIP 2.8 STRL (NEEDLE) ×1 IMPLANT
ELECT NEEDLE TIP 2.8 STRL (NEEDLE) ×2 IMPLANT
ELECT REM PT RETURN 9FT ADLT (ELECTROSURGICAL) ×2
ELECTRODE REM PT RTRN 9FT ADLT (ELECTROSURGICAL) ×1 IMPLANT
GAUZE SPONGE 4X4 12PLY STRL (GAUZE/BANDAGES/DRESSINGS) ×2 IMPLANT
GLOVE BIOGEL PI IND STRL 7.0 (GLOVE) ×1 IMPLANT
GLOVE BIOGEL PI INDICATOR 7.0 (GLOVE) ×1
GLOVE SKINSENSE NS SZ8.0 LF (GLOVE) ×1
GLOVE SKINSENSE STRL SZ8.0 LF (GLOVE) ×1 IMPLANT
GLOVE SS N UNI LF 8.5 STRL (GLOVE) ×2 IMPLANT
GOWN STRL REUS W/ TWL LRG LVL3 (GOWN DISPOSABLE) ×1 IMPLANT
GOWN STRL REUS W/TWL LRG LVL3 (GOWN DISPOSABLE) ×1
GOWN STRL REUS W/TWL XL LVL3 (GOWN DISPOSABLE) ×2 IMPLANT
KIT TURNOVER KIT A (KITS) ×2 IMPLANT
MANIFOLD NEPTUNE II (INSTRUMENTS) ×2 IMPLANT
NDL HYPO 21X1.5 SAFETY (NEEDLE) ×1 IMPLANT
NEEDLE HYPO 21X1.5 SAFETY (NEEDLE) ×2 IMPLANT
NS IRRIG 1000ML POUR BTL (IV SOLUTION) ×2 IMPLANT
PACK BASIC LIMB (CUSTOM PROCEDURE TRAY) ×2 IMPLANT
PAD ARMBOARD 7.5X6 YLW CONV (MISCELLANEOUS) ×2 IMPLANT
POSITIONER HAND ALUMI XLG (MISCELLANEOUS) ×2 IMPLANT
SET BASIN LINEN APH (SET/KITS/TRAYS/PACK) ×2 IMPLANT
SUT ETHILON 3 0 FSL (SUTURE) ×2 IMPLANT
SYR CONTROL 10ML LL (SYRINGE) ×2 IMPLANT

## 2018-08-24 NOTE — Anesthesia Postprocedure Evaluation (Signed)
Anesthesia Post Note  Patient: [name]  Procedure(s) Performed: CARPAL TUNNEL RELEASE (Right )  Anesthesia Type: Regional Level of consciousness: awake and alert Pain management: pain level controlled Vital Signs Assessment: post-procedure vital signs reviewed and stable Respiratory status: spontaneous breathing, nonlabored ventilation and respiratory function stable Cardiovascular status: blood pressure returned to baseline Postop Assessment: no apparent nausea or vomiting Anesthetic complications: no     Last Vitals:  Vitals:   08/24/18 0810 08/24/18 0815  BP: 100/63 94/62  Pulse:  71  Resp: 10 15  Temp: (!) 36.3 C   SpO2: 94% 96%    Last Pain:  Vitals:   08/24/18 0810  TempSrc:   PainSc: 7                  [name]

## 2018-08-24 NOTE — Brief Op Note (Signed)
08/24/2018  8:07 AM  PATIENT:  Graceann Congress  48 y.o. female  PRE-OPERATIVE DIAGNOSIS:  right carpal tunnel syndrome  POST-OPERATIVE DIAGNOSIS:  right carpal tunnel syndrome  PROCEDURE:  Procedure(s): CARPAL TUNNEL RELEASE [phone]  SURGEON:  Surgeon(s) and Role:    Carole Civil, MD - Primary  Carpal tunnel release right wrist  Preop diagnosis carpal tunnel syndrome right wrist postop diagnosis same  Procedure open carpal tunnel release right wrist  Surgeon Aline Brochure  Anesthesia regional Bier block  Sponge and needle count were correct  Operative findings compression of the right median nerveoccupied lesions  Indications failure of conservative treatment to relieve pain and paresthesias and numbness and tingling of the right hand.  The patient was identified in the preop area we confirm the surgical site marked as right wrist. Chart update completed. Patient taken to surgery. She had 2 g of Ancef. After establishing a Bier block her arm was prepped with ChloraPrep.  Timeout executed completed and confirmed site.  A straight incision was made over the carpal tunnel in line with the radial border of the ring finger. Blunt dissection was carried out to find the distal aspect of the carpal tunnel. A blunted judgment was passed beneath the carpal tunnel. Sharp incision was then used to release the transverse carpal ligament. The contents of the carpal tunnel were inspected. The median nerve was compressed with slight discoloration.  The wound was irrigated and then closed with 3-0 nylon suture. We injected 10 mL of plain Marcaine on the radial side of the incision  A sterile bandage was applied and the tourniquet was released the color of the hand and capillary refill were normal  The patient was taken to the recovery room in stable condition   TOURNIQUET:   Total Tourniquet Time Documented: Upper Arm (Right) - 31 minutes Total: Upper Arm (Right) - 31  minutes    Delay start of Pharmacological VTE agent (>24hrs) due to surgical blood loss or risk of bleeding: yes

## 2018-08-24 NOTE — Interval H&P Note (Signed)
History and Physical Interval Note:  08/24/2018 7:09 AM  [name]  has presented today for surgery, with the diagnosis of right carpal tunnel syndrome  The various methods of treatment have been discussed with the patient and family. After consideration of risks, benefits and other options for treatment, the patient has consented to  Procedure(s): CARPAL TUNNEL RELEASE (Right) as a surgical intervention .  The patient's history has been reviewed, patient examined, no change in status, stable for surgery.  I have reviewed the patient's chart and labs.  Questions were answered to the patient's satisfaction.     Arther Abbott

## 2018-08-24 NOTE — Anesthesia Preprocedure Evaluation (Signed)
Anesthesia Evaluation  Patient identified by MRN, date of birth, ID band Patient awake    Reviewed: Allergy & Precautions, H&P , NPO status , Patient's Chart, lab work & pertinent test results  Airway Mallampati: II  TM Distance: >3 FB Neck ROM: full    Dental no notable dental hx. (+) Teeth Intact   Pulmonary neg pulmonary ROS, Current Smoker,    Pulmonary exam normal breath sounds clear to auscultation       Cardiovascular Exercise Tolerance: Good hypertension, On Medications negative cardio ROS   Rhythm:regular Rate:Normal     Neuro/Psych  Headaches, PSYCHIATRIC DISORDERS Anxiety Depression TIA Neuromuscular disease negative neurological ROS  negative psych ROS   GI/Hepatic negative GI ROS, Neg liver ROS, hiatal hernia, GERD  ,  Endo/Other  negative endocrine ROSdiabetes, Well Controlled, Type 1, Insulin Dependent  Renal/GU negative Renal ROS  negative genitourinary   Musculoskeletal   Abdominal   Peds  Hematology negative hematology ROS (+)   Anesthesia Other Findings FBS preop 142 H/o TIAs, no specific tx, last TIA "years ago  Reproductive/Obstetrics negative OB ROS                             Anesthesia Physical  Anesthesia Plan  ASA: III  Anesthesia Plan: MAC and Bier Block and Bier Block-LIDOCAINE ONLY   Post-op Pain Management:    Induction:   PONV Risk Score and Plan:   Airway Management Planned:   Additional Equipment:   Intra-op Plan:   Post-operative Plan:   Informed Consent: I have reviewed the patients History and Physical, chart, labs and discussed the procedure including the risks, benefits and alternatives for the proposed anesthesia with the patient or authorized representative who has indicated his/her understanding and acceptance.       Plan Discussed with: CRNA and Anesthesiologist  Anesthesia Plan Comments:         Anesthesia Quick  Evaluation

## 2018-08-24 NOTE — Discharge Instructions (Addendum)
Elevate the hand for 72 hours.  Apply ice packs as needed to control swelling.  Moves your fingers every hour opening and closing the hand to make a closed fist 10 times per hour while awake Remove dressing on Saturday apply Neosporin and a large Band-Aid rewrap Ace bandage loosely You may take a shower if you can place a glove over the hand and keep the dressing dry You may drive on Monday  PATIENT INSTRUCTIONS POST-ANESTHESIA  IMMEDIATELY FOLLOWING SURGERY:  Do not drive or operate machinery for the first twenty four hours after surgery.  Do not make any important decisions for twenty four hours after surgery or while taking narcotic pain medications or sedatives.  If you develop intractable nausea and vomiting or a severe headache please notify your doctor immediately.  FOLLOW-UP:  Please make an appointment with your surgeon as instructed. You do not need to follow up with anesthesia unless specifically instructed to do so.  WOUND CARE INSTRUCTIONS (if applicable):  Keep a dry clean dressing on the anesthesia/puncture wound site if there is drainage.  Once the wound has quit draining you may leave it open to air.  Generally you should leave the bandage intact for twenty four hours unless there is drainage.  If the epidural site drains for more than 36-48 hours please call the anesthesia department.  QUESTIONS?:  Please feel free to call your physician or the hospital operator if you have any questions, and they will be happy to assist you.       Open Carpal Tunnel Release, Care After This sheet gives you information about how to care for yourself after your procedure. Your health care provider may also give you more specific instructions. If you have problems or questions, contact your health care provider. What can I expect after the procedure? After the procedure, it is common to have:  Wrist stiffness.  Bruising. Follow these instructions at home: Bathing  Do not take baths,  swim, or use a hot tub until your health care provider approves. Ask your health care provider if you may take showers.  Keep your bandage (dressing) dry until your health care provider says it can be removed. If you have a splint or brace:  Wear the splint or brace as told by your health care provider. You may need to wear it for 2-3 weeks. Remove it only as told by your health care provider.  Loosen the splint or brace if your fingers tingle, become numb, or turn cold and blue.  Keep the splint or brace clean.  If the splint or brace is not waterproof: ? Do not let it get wet. ? Cover it with a watertight covering when you take a bath or a shower. Incision care   Follow instructions from your health care provider about how to take care of your incision. Make sure you: ? Wash your hands with soap and water before you change your dressing. If soap and water are not available, use hand sanitizer. ? Change your dressing as told by your health care provider. ? Leave stitches (sutures), skin glue, or adhesive strips in place. These skin closures may need to stay in place for 2 weeks or longer. If adhesive strip edges start to loosen and curl up, you may trim the loose edges. Do not remove adhesive strips completely unless your health care provider tells you to do that.  Check your incision area every day for signs of infection. Check for: ? Redness, swelling, or pain. ?  Fluid or blood. ? Warmth. ? Pus or a bad smell. Managing pain, stiffness, and swelling   If directed, put ice on the affected area. ? If you have a removable splint or brace, remove it as told by your health care provider. ? Put ice in a plastic bag. ? Place a towel between your skin and the bag. ? Leave the ice on for 20 minutes, 2-3 times a day.  Move your fingers often to avoid stiffness and to lessen swelling.  Raise (elevate) your wrist above the level of your heart while you are sitting or lying  down. Activity  Do not drive until your health care provider approves.  Do not drive or use heavy machinery while taking prescription pain medicine.  Return to your normal activities as told by your health care provider. Avoid activities that cause pain.  If physical therapy was prescribed, do exercises as told by your therapist. Physical therapy can help you heal faster and regain movement. General instructions  Take over-the-counter and prescription medicines only as told by your health care provider.  If you are taking prescription pain medicine, take actions to prevent or treat constipation. Your health care provider may recommend that you: ? Drink enough fluid to keep your urine pale yellow. ? Eat foods that are high in fiber, such as fresh fruits and vegetables, whole grains, and beans. ? Limit foods that are high in fat and processed sugars, such as fried or sweet foods. ? Take an over-the-counter or prescription medicine for constipation.  Do not use any products that contain nicotine or tobacco, such as cigarettes and e-cigarettes. If you need help quitting, ask your health care provider.  Keep all follow-up visits as told by your health care provider and physical therapist. This is important. Contact a health care provider if:  You have redness or swelling around your incision.  You have fluid or blood coming from your incision.  Your incision feels warm to the touch.  You have pus or a bad smell coming from your incision.  You have a fever.  You have chills.  You have pain that does not get better with medicine.  Your carpal tunnel symptoms do not go away after 2 months.  Your carpal tunnel symptoms go away and then come back. Get help right away if:  You have pain or numbness that is getting worse.  Your fingers or fingertips become very pale or bluish in color.  You are not able to move your fingers. Summary  It is common to have wrist stiffness and  bruising after a carpal tunnel release.  Icing and raising (elevating) your wrist may help to lessen swelling and pain.  Call your health care provider if you have a fever or notice any signs of infection in your incision area. This information is not intended to replace advice given to you by your health care provider. Make sure you discuss any questions you have with your health care provider. Document Released: 02/05/2005 Document Revised: 03/28/2017 Document Reviewed: 03/28/2017 Elsevier Interactive Patient Education  2019 [place]. Acetaminophen; Oxycodone tablets What is this medicine? ACETAMINOPHEN; OXYCODONE (a set a MEE noe fen; ox i KOE done) is a pain reliever. It is used to treat moderate to severe pain. This medicine may be used for other purposes; ask your health care provider or pharmacist if you have questions. COMMON BRAND NAME(S): Endocet, Magnacet, Nalocet, Narvox, Percocet, Perloxx, Primalev, Primlev, Roxicet, Xolox What should I tell my health care provider  before I take this medicine? They need to know if you have any of these conditions: -brain tumor -Crohn's disease, inflammatory bowel disease, or ulcerative colitis -drug abuse or addiction -head injury -heart or circulation problems -if you often drink alcohol -kidney disease or problems going to the bathroom -liver disease -lung disease, asthma, or breathing problems -an unusual or allergic reaction to acetaminophen, oxycodone, other opioid analgesics, other medicines, foods, dyes, or preservatives -pregnant or trying to get pregnant -breast-feeding How should I use this medicine? Take this medicine by mouth with a full glass of water. Follow the directions on the prescription label. You can take it with or without food. If it upsets your stomach, take it with food. Take your medicine at regular intervals. Do not take it more often than directed. A special MedGuide will be given to you by the pharmacist  with each prescription and refill. Be sure to read this information carefully each time. Talk to your pediatrician regarding the use of this medicine in children. Special care may be needed. Overdosage: If you think you have taken too much of this medicine contact a poison control center or emergency room at once. NOTE: This medicine is only for you. Do not share this medicine with others. What if I miss a dose? If you miss a dose, take it as soon as you can. If it is almost time for your next dose, take only that dose. Do not take double or extra doses. What may interact with this medicine? This medicine may interact with the following medications: -alcohol -antihistamines for allergy, cough and cold -antiviral medicines for HIV or AIDS -atropine -certain antibiotics like clarithromycin, erythromycin, linezolid, rifampin -certain medicines for anxiety or sleep -certain medicines for bladder problems like oxybutynin, tolterodine -certain medicines for depression like amitriptyline, fluoxetine, sertraline -certain medicines for fungal infections like ketoconazole, itraconazole, voriconazole -certain medicines for migraine headache like almotriptan, eletriptan, frovatriptan, naratriptan, rizatriptan, sumatriptan, zolmitriptan -certain medicines for nausea or vomiting like dolasetron, ondansetron, palonosetron -certain medicines for Parkinson's disease like benztropine, trihexyphenidyl -certain medicines for seizures like phenobarbital, phenytoin, primidone -certain medicines for stomach problems like dicyclomine, hyoscyamine -certain medicines for travel sickness like scopolamine -diuretics -general anesthetics like halothane, isoflurane, methoxyflurane, propofol -ipratropium -local anesthetics like lidocaine, pramoxine, tetracaine -MAOIs like Carbex, Eldepryl, Marplan, Nardil, and Parnate -medicines that relax muscles for surgery -methylene blue -nilotinib -other medicines with  acetaminophen -other narcotic medicines for pain or cough -phenothiazines like chlorpromazine, mesoridazine, prochlorperazine, thioridazine This list may not describe all possible interactions. Give your health care provider a list of all the medicines, herbs, non-prescription drugs, or dietary supplements you use. Also tell them if you smoke, drink alcohol, or use illegal drugs. Some items may interact with your medicine. What should I watch for while using this medicine? Tell your doctor or health care professional if your pain does not go away, if it gets worse, or if you have new or a different type of pain. You may develop tolerance to the medicine. Tolerance means that you will need a higher dose of the medication for pain relief. Tolerance is normal and is expected if you take this medicine for a long time. Do not suddenly stop taking your medicine because you may develop a severe reaction. Your body becomes used to the medicine. This does NOT mean you are addicted. Addiction is a behavior related to getting and using a drug for a non-medical reason. If you have pain, you have a medical reason to take pain medicine.  Your doctor will tell you how much medicine to take. If your doctor wants you to stop the medicine, the dose will be slowly lowered over time to avoid any side effects. There are different types of narcotic medicines (opiates). If you take more than one type at the same time or if you are taking another medicine that also causes drowsiness, you may have more side effects. Give your health care provider a list of all medicines you use. Your doctor will tell you how much medicine to take. Do not take more medicine than directed. Call emergency for help if you have problems breathing or unusual sleepiness. Do not take other medicines that contain acetaminophen with this medicine. Always read labels carefully. If you have questions, ask your doctor or pharmacist. If you take too much  acetaminophen get medical help right away. Too much acetaminophen can be very dangerous and cause liver damage. Even if you do not have symptoms, it is important to get help right away. You may get drowsy or dizzy. Do not drive, use machinery, or do anything that needs mental alertness until you know how this medicine affects you. Do not stand or sit up quickly, especially if you are an older patient. This reduces the risk of dizzy or fainting spells. Alcohol may interfere with the effect of this medicine. Avoid alcoholic drinks. The medicine will cause constipation. Try to have a bowel movement at least every 2 to 3 days. If you do not have a bowel movement for 3 days, call your doctor or health care professional. Your mouth may get dry. Chewing sugarless gum or sucking hard candy, and drinking plenty or water may help. Contact your doctor if the problem does not go away or is severe. What side effects may I notice from receiving this medicine? Side effects that you should report to your doctor or health care professional as soon as possible: -allergic reactions like skin rash, itching or hives, swelling of the face, lips, or tongue -breathing problems -confusion -redness, blistering, peeling or loosening of the skin, including inside the mouth -signs and symptoms of liver injury like dark yellow or brown urine; general ill feeling or flu-like symptoms; light-colored stools; loss of appetite; nausea; right upper belly pain; unusually weak or tired; yellowing of the eyes or skin -signs and symptoms of low blood pressure like dizziness; feeling faint or lightheaded, falls; unusually weak or tired -trouble passing urine or change in the amount of urine Side effects that usually do not require medical attention (report to your doctor or health care professional if they continue or are bothersome): -constipation -dry mouth -nausea, vomiting -tiredness This list may not describe all possible side  effects. Call your doctor for medical advice about side effects. You may report side effects to FDA at 1-800-FDA-1088. Where should I keep my medicine? Keep out of the reach of children. This medicine can be abused. Keep your medicine in a safe place to protect it from theft. Do not share this medicine with anyone. Selling or giving away this medicine is dangerous and against the law. Store at room temperature between 20 and 25 degrees C (68 and 77 degrees F). This medicine may cause harm and death if it is taken by other adults, children, or pets. Return medicine that has not been used to an official disposal site. Contact the DEA at 6363293720 or your city/county government to find a site. If you cannot return the medicine, flush it down the toilet. Do not use  the medicine after the expiration date. NOTE: This sheet is a summary. It may not cover all possible information. If you have questions about this medicine, talk to your doctor, pharmacist, or health care provider.  2019 Elsevier/Gold Standard (2016-11-23 15:46:38)

## 2018-08-24 NOTE — Anesthesia Procedure Notes (Signed)
Anesthesia Regional Block: Bier block (IV Regional)   Pre-Anesthetic Checklist: ,, timeout performed, Correct Patient, Correct Site, Correct Laterality, Correct Procedure, Correct Position, site marked, Risks and benefits discussed, Surgical consent, Pre-op evaluation  Laterality: Right  Prep: Betadine       Needles:       Needle Gauge: 20     Additional Needles:   Procedures:,,,,,, Esmarch exsanguination, single tourniquet utilized,  Narrative:  Start time: 08/24/2018 7:34 AM CRNA: Charmaine Downs, CRNA

## 2018-08-24 NOTE — Op Note (Addendum)
08/24/2018  8:07 AM  PATIENT:  [name]  48 y.o. female  PRE-OPERATIVE DIAGNOSIS:  right carpal tunnel syndrome  POST-OPERATIVE DIAGNOSIS:  right carpal tunnel syndrome  PROCEDURE:  Procedure(s): CARPAL TUNNEL RELEASE [phone]  SURGEON:  Surgeon(s) and Role:    Carole Civil, MD - Primary  Carpal tunnel release right wrist  Preop diagnosis carpal tunnel syndrome right wrist postop diagnosis same  Procedure open carpal tunnel release right wrist  Surgeon Aline Brochure  Anesthesia regional Bier block  Sponge and needle count were correct  Operative findings compression of the right median nerveoccupied lesions  Indications failure of conservative treatment to relieve pain and paresthesias and numbness and tingling of the right hand.  The patient was identified in the preop area we confirm the surgical site marked as right wrist. Chart update completed. Patient taken to surgery. She had 2 g of Ancef. After establishing a Bier block her arm was prepped with ChloraPrep.  Timeout executed completed and confirmed site.  A straight incision was made over the carpal tunnel in line with the radial border of the ring finger. Blunt dissection was carried out to find the distal aspect of the carpal tunnel. A blunted judgment was passed beneath the carpal tunnel. Sharp incision was then used to release the transverse carpal ligament. The contents of the carpal tunnel were inspected. The median nerve was compressed with slight discoloration.  The wound was irrigated and then closed with 3-0 nylon suture. We injected 10 mL of plain Marcaine on the radial side of the incision  A sterile bandage was applied and the tourniquet was released the color of the hand and capillary refill were normal  The patient was taken to the recovery room in stable condition   TOURNIQUET:   Total Tourniquet Time Documented: Upper Arm (Right) - 31 minutes Total: Upper Arm (Right) - 31  minutes    Delay start of Pharmacological VTE agent (>24hrs) due to surgical blood loss or risk of bleeding: yes

## 2018-08-24 NOTE — Telephone Encounter (Signed)
FYI: A representative from [facility] called asking to speak with you. She will call back in the morning to ask some questions regarding this patient. The call came from [phone]

## 2018-08-24 NOTE — Transfer of Care (Signed)
Immediate Anesthesia Transfer of Care Note  Patient: [name]  Procedure(s) Performed: CARPAL TUNNEL RELEASE (Right )  Patient Location: PACU  Anesthesia Type:Bier block  Level of Consciousness: awake and patient cooperative  Airway & Oxygen Therapy: Patient Spontanous Breathing and Patient connected to nasal cannula oxygen  Post-op Assessment: Report given to RN, Post -op Vital signs reviewed and stable and Patient moving all extremities  Post vital signs: Reviewed and stable  Last Vitals:  Vitals Value Taken Time  BP    Temp    Pulse    Resp    SpO2      Last Pain:  Vitals:   08/24/18 0639  TempSrc: Oral  PainSc: 8       Patients Stated Pain Goal: 7 (60/67/70 3403)  Complications: No apparent anesthesia complications

## 2018-08-29 DIAGNOSIS — E782 Mixed hyperlipidemia: Secondary | ICD-10-CM | POA: Diagnosis not present

## 2018-08-29 DIAGNOSIS — G5603 Carpal tunnel syndrome, bilateral upper limbs: Secondary | ICD-10-CM | POA: Diagnosis not present

## 2018-08-29 DIAGNOSIS — E1043 Type 1 diabetes mellitus with diabetic autonomic (poly)neuropathy: Secondary | ICD-10-CM | POA: Diagnosis not present

## 2018-08-29 DIAGNOSIS — Z720 Tobacco use: Secondary | ICD-10-CM | POA: Diagnosis not present

## 2018-08-31 DIAGNOSIS — E1065 Type 1 diabetes mellitus with hyperglycemia: Secondary | ICD-10-CM | POA: Diagnosis not present

## 2018-09-06 ENCOUNTER — Encounter: Payer: Self-pay | Admitting: Orthopaedic Surgery

## 2018-09-06 ENCOUNTER — Ambulatory Visit (INDEPENDENT_AMBULATORY_CARE_PROVIDER_SITE_OTHER): Payer: Medicare HMO | Admitting: Orthopaedic Surgery

## 2018-09-06 VITALS — BP 91/61 | HR 78 | Ht 67.0 in | Wt 161.0 lb

## 2018-09-06 DIAGNOSIS — Z9889 Other specified postprocedural states: Secondary | ICD-10-CM

## 2018-09-06 NOTE — Progress Notes (Signed)
CC:  I want my wound checked today  She had carpal tunnel surgery two weeks ago.  She was concerned about the proximal part of the wound.  She had some bruising but it has now gone away.  Her wound looks good. She has no erythema or ecchymosis.  NV intact.  The sutures will be removed.  Return in two weeks.  Call if any problem.  Precautions discussed.   Encounter Diagnosis  Name Primary?  . Status post carpal tunnel release Yes   Electronically Signed Sanjuana Kava, MD 2/5/20203:05 PM

## 2018-09-07 ENCOUNTER — Ambulatory Visit (INDEPENDENT_AMBULATORY_CARE_PROVIDER_SITE_OTHER): Payer: Medicare HMO | Admitting: Orthopaedic Surgery

## 2018-09-07 DIAGNOSIS — Z9889 Other specified postprocedural states: Secondary | ICD-10-CM

## 2018-09-07 NOTE — Patient Instructions (Signed)
Return for follow up with Dr. Aline Brochure.

## 2018-09-07 NOTE — Progress Notes (Signed)
Patient recently had Carpal Tunnel Release on right hand DOS 08/24/18. Wound checked no redness or warmth to the hand. Outside edges of wound cleaned steri strips and skin glu applied. Instructed patient not to get wound wet and do not submerge in water. Allow strips to fall off on there own and do not pull at it. Pt verbalized understanding.

## 2018-09-11 ENCOUNTER — Ambulatory Visit (INDEPENDENT_AMBULATORY_CARE_PROVIDER_SITE_OTHER): Payer: Medicare HMO | Admitting: Orthopedic Surgery

## 2018-09-11 ENCOUNTER — Ambulatory Visit: Payer: Medicare HMO | Admitting: Orthopedic Surgery

## 2018-09-11 ENCOUNTER — Encounter: Payer: Self-pay | Admitting: Orthopedic Surgery

## 2018-09-11 VITALS — BP 103/61 | HR 88 | Ht 67.0 in

## 2018-09-11 DIAGNOSIS — Z9889 Other specified postprocedural states: Secondary | ICD-10-CM

## 2018-09-11 DIAGNOSIS — T8130XA Disruption of wound, unspecified, initial encounter: Secondary | ICD-10-CM

## 2018-09-11 MED ORDER — DOXYCYCLINE HYCLATE 100 MG PO TABS: 100.0000 mg | ORAL_TABLET | Freq: Two times a day (BID) | ORAL | 1 refills | Status: DC

## 2018-09-11 NOTE — Progress Notes (Signed)
Pod # 17  RT CTR   WOUND PROBLEM   Sutures were removed last week.  Nurse saw the patient wound seem to be opening.  Steri-Strips were applied.  Comes in for wound check  She has soreness over the flexor retinaculum and proximal port of the incision where there is a little bit of erythema but the essence of the wound still looks to be good she has a lot of scabbing over the wound which we removed some of it.  She has some openings please see the picture which is in the media section of the chart  I will start her on doxycycline she can add some Neosporin and a dressing change daily.  Okay to get it wet.  Follow-up in a week to check the wound  Allergies  Allergen Reactions  . Zocor [Simvastatin] Other (See Comments)    Muscle aches and pains  . Clindamycin/Lincomycin Rash  . Codeine Rash  . Penicillins Rash    Has patient had a PCN reaction causing immediate rash, facial/tongue/throat swelling, SOB or lightheadedness with hypotension: Yes Has patient had a PCN reaction causing severe rash involving mucus membranes or skin necrosis: Yes Has patient had a PCN reaction that required hospitalization: Yes Has patient had a PCN reaction occurring within the last 10 years: No If all of the above answers are "NO", then may proceed with Cephalosporin use.   . Sulfa Antibiotics Rash         START DOXYCYCLINE  Meds ordered this encounter  Medications  . doxycycline (VIBRA-TABS) 100 MG tablet    Sig: Take 1 tablet (100 mg total) by mouth 2 (two) times daily.    Dispense:  28 tablet    Refill:  1

## 2018-09-14 DIAGNOSIS — Z9889 Other specified postprocedural states: Secondary | ICD-10-CM | POA: Insufficient documentation

## 2018-09-16 DIAGNOSIS — E1065 Type 1 diabetes mellitus with hyperglycemia: Secondary | ICD-10-CM | POA: Diagnosis not present

## 2018-09-18 ENCOUNTER — Ambulatory Visit (INDEPENDENT_AMBULATORY_CARE_PROVIDER_SITE_OTHER): Payer: Medicare HMO | Admitting: Orthopedic Surgery

## 2018-09-18 ENCOUNTER — Encounter: Payer: Self-pay | Admitting: Orthopedic Surgery

## 2018-09-18 VITALS — BP 113/76 | HR 78 | Ht 67.0 in | Wt 161.0 lb

## 2018-09-18 DIAGNOSIS — G5603 Carpal tunnel syndrome, bilateral upper limbs: Secondary | ICD-10-CM | POA: Diagnosis not present

## 2018-09-18 DIAGNOSIS — F349 Persistent mood [affective] disorder, unspecified: Secondary | ICD-10-CM | POA: Diagnosis not present

## 2018-09-18 DIAGNOSIS — T8130XD Disruption of wound, unspecified, subsequent encounter: Secondary | ICD-10-CM

## 2018-09-18 DIAGNOSIS — M5412 Radiculopathy, cervical region: Secondary | ICD-10-CM | POA: Diagnosis not present

## 2018-09-18 DIAGNOSIS — Z9889 Other specified postprocedural states: Secondary | ICD-10-CM

## 2018-09-18 DIAGNOSIS — M542 Cervicalgia: Secondary | ICD-10-CM | POA: Diagnosis not present

## 2018-09-18 NOTE — Progress Notes (Signed)
Chief Complaint  Patient presents with  . Post-op Follow-up    08/24/18 right carpal tunnel release     Fu wound problem   Doxycycline day 7   The wound condition has improved  Superficial dehiscence No erythema   From in the hand   Encounter Diagnoses  Name Primary?  . S/P carpal tunnel release right 08/24/18   . Wound disruption, subsequent encounter Yes   F/U 26 Feb  Continue neosporin and dressing change and antibiotics

## 2018-09-20 ENCOUNTER — Ambulatory Visit: Payer: Medicare HMO | Admitting: Orthopedic Surgery

## 2018-09-27 ENCOUNTER — Encounter: Payer: Self-pay | Admitting: Orthopedic Surgery

## 2018-09-27 ENCOUNTER — Ambulatory Visit (INDEPENDENT_AMBULATORY_CARE_PROVIDER_SITE_OTHER): Payer: Medicare HMO | Admitting: Orthopedic Surgery

## 2018-09-27 VITALS — BP 110/71 | HR 81 | Ht 67.0 in | Wt 161.0 lb

## 2018-09-27 DIAGNOSIS — Z9889 Other specified postprocedural states: Secondary | ICD-10-CM

## 2018-09-27 NOTE — Progress Notes (Signed)
POSTOP VISIT  POD # 34  Chief Complaint  Patient presents with  . Wound Check    right CTR 08/24/18    48 year old female treated with antibiotics for superficial wound infection after carpal tunnel release complains of pain at the flexor retinaculum and into the thenar eminence.  She knows that she has significant improvement in terms of redness and tenderness over the right palmar wound   Encounter Diagnosis  Name Primary?  . S/P carpal tunnel release right 08/24/18 Yes    Wound closed no erythema tenderness in the thenar eminence and flexor retinaculum full range of motion no carpal tunnel signs or symptoms  Postoperative plan (Work, WB, No orders of the defined types were placed in this encounter. ,FU)  Follow-up as needed

## 2018-09-29 ENCOUNTER — Encounter (HOSPITAL_COMMUNITY): Payer: Self-pay

## 2018-09-29 ENCOUNTER — Emergency Department (HOSPITAL_COMMUNITY): Payer: Medicare HMO

## 2018-09-29 ENCOUNTER — Other Ambulatory Visit: Payer: Self-pay

## 2018-09-29 ENCOUNTER — Inpatient Hospital Stay (HOSPITAL_COMMUNITY)
Admission: EM | Admit: 2018-09-29 | Discharge: 2018-10-01 | DRG: 919 | Disposition: A | Discharge status: Home / Self Care | Payer: Medicare HMO | Attending: Family Medicine | Admitting: Family Medicine

## 2018-09-29 DIAGNOSIS — I959 Hypotension, unspecified: Secondary | ICD-10-CM | POA: Diagnosis present

## 2018-09-29 DIAGNOSIS — Z882 Allergy status to sulfonamides status: Secondary | ICD-10-CM

## 2018-09-29 DIAGNOSIS — Z8249 Family history of ischemic heart disease and other diseases of the circulatory system: Secondary | ICD-10-CM

## 2018-09-29 DIAGNOSIS — E1110 Type 2 diabetes mellitus with ketoacidosis without coma: Secondary | ICD-10-CM | POA: Diagnosis present

## 2018-09-29 DIAGNOSIS — R4182 Altered mental status, unspecified: Secondary | ICD-10-CM | POA: Diagnosis not present

## 2018-09-29 DIAGNOSIS — E860 Dehydration: Secondary | ICD-10-CM

## 2018-09-29 DIAGNOSIS — K219 Gastro-esophageal reflux disease without esophagitis: Secondary | ICD-10-CM | POA: Diagnosis present

## 2018-09-29 DIAGNOSIS — Z8673 Personal history of transient ischemic attack (TIA), and cerebral infarction without residual deficits: Secondary | ICD-10-CM | POA: Diagnosis not present

## 2018-09-29 DIAGNOSIS — I10 Essential (primary) hypertension: Secondary | ICD-10-CM | POA: Diagnosis not present

## 2018-09-29 DIAGNOSIS — E1042 Type 1 diabetes mellitus with diabetic polyneuropathy: Secondary | ICD-10-CM | POA: Diagnosis not present

## 2018-09-29 DIAGNOSIS — Z888 Allergy status to other drugs, medicaments and biological substances status: Secondary | ICD-10-CM

## 2018-09-29 DIAGNOSIS — T85694A Other mechanical complication of insulin pump, initial encounter: Secondary | ICD-10-CM | POA: Diagnosis not present

## 2018-09-29 DIAGNOSIS — G5602 Carpal tunnel syndrome, left upper limb: Secondary | ICD-10-CM | POA: Diagnosis present

## 2018-09-29 DIAGNOSIS — E1010 Type 1 diabetes mellitus with ketoacidosis without coma: Secondary | ICD-10-CM | POA: Diagnosis not present

## 2018-09-29 DIAGNOSIS — Z881 Allergy status to other antibiotic agents status: Secondary | ICD-10-CM

## 2018-09-29 DIAGNOSIS — N179 Acute kidney failure, unspecified: Secondary | ICD-10-CM | POA: Diagnosis not present

## 2018-09-29 DIAGNOSIS — E785 Hyperlipidemia, unspecified: Secondary | ICD-10-CM | POA: Diagnosis present

## 2018-09-29 DIAGNOSIS — Z9981 Dependence on supplemental oxygen: Secondary | ICD-10-CM

## 2018-09-29 DIAGNOSIS — E871 Hypo-osmolality and hyponatremia: Secondary | ICD-10-CM

## 2018-09-29 DIAGNOSIS — Z7982 Long term (current) use of aspirin: Secondary | ICD-10-CM

## 2018-09-29 DIAGNOSIS — E875 Hyperkalemia: Secondary | ICD-10-CM

## 2018-09-29 DIAGNOSIS — Z9049 Acquired absence of other specified parts of digestive tract: Secondary | ICD-10-CM

## 2018-09-29 DIAGNOSIS — E1065 Type 1 diabetes mellitus with hyperglycemia: Secondary | ICD-10-CM | POA: Diagnosis not present

## 2018-09-29 DIAGNOSIS — Z800 Family history of malignant neoplasm of digestive organs: Secondary | ICD-10-CM

## 2018-09-29 DIAGNOSIS — Z9641 Presence of insulin pump (external) (internal): Secondary | ICD-10-CM | POA: Diagnosis not present

## 2018-09-29 DIAGNOSIS — K589 Irritable bowel syndrome without diarrhea: Secondary | ICD-10-CM | POA: Diagnosis present

## 2018-09-29 DIAGNOSIS — Z880 Allergy status to penicillin: Secondary | ICD-10-CM

## 2018-09-29 DIAGNOSIS — Y742 Prosthetic and other implants, materials and accessory general hospital and personal-use devices associated with adverse incidents: Secondary | ICD-10-CM | POA: Diagnosis present

## 2018-09-29 DIAGNOSIS — Z791 Long term (current) use of non-steroidal anti-inflammatories (NSAID): Secondary | ICD-10-CM

## 2018-09-29 DIAGNOSIS — E1011 Type 1 diabetes mellitus with ketoacidosis with coma: Secondary | ICD-10-CM | POA: Diagnosis not present

## 2018-09-29 DIAGNOSIS — Z794 Long term (current) use of insulin: Secondary | ICD-10-CM | POA: Diagnosis not present

## 2018-09-29 DIAGNOSIS — Z79899 Other long term (current) drug therapy: Secondary | ICD-10-CM

## 2018-09-29 DIAGNOSIS — Z823 Family history of stroke: Secondary | ICD-10-CM

## 2018-09-29 DIAGNOSIS — Z885 Allergy status to narcotic agent status: Secondary | ICD-10-CM

## 2018-09-29 DIAGNOSIS — F17210 Nicotine dependence, cigarettes, uncomplicated: Secondary | ICD-10-CM | POA: Diagnosis present

## 2018-09-29 DIAGNOSIS — Z90710 Acquired absence of both cervix and uterus: Secondary | ICD-10-CM | POA: Diagnosis not present

## 2018-09-29 DIAGNOSIS — F329 Major depressive disorder, single episode, unspecified: Secondary | ICD-10-CM | POA: Diagnosis present

## 2018-09-29 LAB — LACTIC ACID, PLASMA
Lactic Acid, Venous: 6 mmol/L (ref 0.5–1.9)
Lactic Acid, Venous: 3.5 mmol/L (ref 0.5–1.9)

## 2018-09-29 LAB — URINALYSIS, ROUTINE W REFLEX MICROSCOPIC
Bacteria, UA: NONE SEEN
Bilirubin Urine: NEGATIVE
Glucose, UA: >=500 mg/dL — AB
Ketones, ur: 80 mg/dL — AB
Leukocytes,Ua: NEGATIVE
Nitrite: NEGATIVE
Protein, ur: NEGATIVE mg/dL
Specific Gravity, Urine: 1.017 (ref 1.005–1.030)
pH: 5 (ref 5.0–8.0)

## 2018-09-29 LAB — CBC WITH DIFFERENTIAL/PLATELET
Abs Immature Granulocytes: 0.25 10*3/uL — ABNORMAL HIGH (ref 0.00–0.07)
BASOS ABS: 0.1 10*3/uL (ref 0.0–0.1)
Basophils Relative: 0 %
Eosinophils Absolute: 0.4 10*3/uL (ref 0.0–0.5)
Eosinophils Relative: 2 %
HCT: 43.9 % (ref 36.0–46.0)
Hemoglobin: 13.3 g/dL (ref 12.0–15.0)
Immature Granulocytes: 1 %
Lymphocytes Relative: 6 %
Lymphs Abs: 1.5 10*3/uL (ref 0.7–4.0)
MCH: 30.1 pg (ref 26.0–34.0)
MCHC: 30.3 g/dL (ref 30.0–36.0)
MCV: 99.3 fL (ref 80.0–100.0)
Monocytes Absolute: 1.5 10*3/uL — ABNORMAL HIGH (ref 0.1–1.0)
Monocytes Relative: 6 %
NEUTROS PCT: 85 %
Neutro Abs: 21.4 10*3/uL — ABNORMAL HIGH (ref 1.7–7.7)
PLATELETS: 230 10*3/uL (ref 150–400)
RBC: 4.42 MIL/uL (ref 3.87–5.11)
RDW: 13 % (ref 11.5–15.5)
WBC MORPHOLOGY: INCREASED
WBC: 25.1 10*3/uL — ABNORMAL HIGH (ref 4.0–10.5)
nRBC: 0 % (ref 0.0–0.2)

## 2018-09-29 LAB — BASIC METABOLIC PANEL
Anion gap: 7 (ref 5–15)
BUN: 53 mg/dL — ABNORMAL HIGH (ref 6–20)
BUN: 51 mg/dL — ABNORMAL HIGH (ref 6–20)
BUN: 32 mg/dL — ABNORMAL HIGH (ref 6–20)
CO2: 4 mmol/L — ABNORMAL LOW (ref 22–32)
CO2: 6 mmol/L — AB (ref 22–32)
CO2: 20 mmol/L — ABNORMAL LOW (ref 22–32)
CREATININE: 1.01 mg/dL — AB (ref 0.44–1.00)
Calcium: 8.5 mg/dL — ABNORMAL LOW (ref 8.9–10.3)
Calcium: 8 mg/dL — ABNORMAL LOW (ref 8.9–10.3)
Calcium: 8.2 mg/dL — ABNORMAL LOW (ref 8.9–10.3)
Chloride: 86 mmol/L — ABNORMAL LOW (ref 98–111)
Chloride: 101 mmol/L (ref 98–111)
Chloride: 115 mmol/L — ABNORMAL HIGH (ref 98–111)
Creatinine, Ser: 2.98 mg/dL — ABNORMAL HIGH (ref 0.44–1.00)
Creatinine, Ser: 2.52 mg/dL — ABNORMAL HIGH (ref 0.44–1.00)
GFR calc Af Amer: 21 mL/min — ABNORMAL LOW (ref >60)
GFR calc Af Amer: 25 mL/min — ABNORMAL LOW (ref >60)
GFR calc Af Amer: >60 mL/min (ref >60)
GFR calc non Af Amer: 22 mL/min — ABNORMAL LOW (ref >60)
GFR calc non Af Amer: >60 mL/min (ref >60)
GFR, EST NON AFRICAN AMERICAN: 18 mL/min — AB (ref >60)
Glucose, Bld: 1185 mg/dL (ref 70–99)
Glucose, Bld: 879 mg/dL (ref 70–99)
Glucose, Bld: 127 mg/dL — ABNORMAL HIGH (ref 70–99)
Potassium: 6.7 mmol/L (ref 3.5–5.1)
Potassium: 4.9 mmol/L (ref 3.5–5.1)
Potassium: 3.8 mmol/L (ref 3.5–5.1)
SODIUM: 124 mmol/L — AB (ref 135–145)
Sodium: 131 mmol/L — ABNORMAL LOW (ref 135–145)
Sodium: 142 mmol/L (ref 135–145)

## 2018-09-29 LAB — GLUCOSE, CAPILLARY
GLUCOSE-CAPILLARY: 215 mg/dL — AB (ref 70–99)
GLUCOSE-CAPILLARY: 110 mg/dL — AB (ref 70–99)
Glucose-Capillary: 335 mg/dL — ABNORMAL HIGH (ref 70–99)
Glucose-Capillary: 261 mg/dL — ABNORMAL HIGH (ref 70–99)
Glucose-Capillary: 201 mg/dL — ABNORMAL HIGH (ref 70–99)
Glucose-Capillary: 141 mg/dL — ABNORMAL HIGH (ref 70–99)
Glucose-Capillary: 135 mg/dL — ABNORMAL HIGH (ref 70–99)
Glucose-Capillary: 88 mg/dL (ref 70–99)
Glucose-Capillary: 77 mg/dL (ref 70–99)

## 2018-09-29 LAB — BLOOD GAS, VENOUS
Acid-base deficit: 23.5 mmol/L — ABNORMAL HIGH (ref 0.0–2.0)
Bicarbonate: 6.9 mmol/L — ABNORMAL LOW (ref 20.0–28.0)
FIO2: 21
O2 Saturation: 58 %
Patient temperature: 36.4
pCO2, Ven: 25.3 mmHg — ABNORMAL LOW (ref 44.0–60.0)
pH, Ven: 6.98 — CL (ref 7.250–7.430)
pO2, Ven: 37.6 mmHg (ref 32.0–45.0)

## 2018-09-29 LAB — CBG MONITORING, ED
Glucose-Capillary: >600 mg/dL (ref 70–99)
Glucose-Capillary: >600 mg/dL (ref 70–99)
Glucose-Capillary: >600 mg/dL (ref 70–99)
Glucose-Capillary: >600 mg/dL (ref 70–99)
Glucose-Capillary: >600 mg/dL (ref 70–99)
Glucose-Capillary: 486 mg/dL — ABNORMAL HIGH (ref 70–99)
Glucose-Capillary: 426 mg/dL — ABNORMAL HIGH (ref 70–99)
Glucose-Capillary: 392 mg/dL — ABNORMAL HIGH (ref 70–99)

## 2018-09-29 LAB — I-STAT BETA HCG BLOOD, ED (MC, WL, AP ONLY): I-stat hCG, quantitative: <5 m[IU]/mL (ref <5)

## 2018-09-29 MED ORDER — INSULIN ASPART 100 UNIT/ML ~~LOC~~ SOLN: 0.0000 [IU] | Freq: Every day | SUBCUTANEOUS | Status: DC

## 2018-09-29 MED ORDER — DEXTROSE-NACL 5-0.45 % IV SOLN
INTRAVENOUS | Status: DC
  Administered 2018-09-29: 18:00:00 via INTRAVENOUS

## 2018-09-29 MED ORDER — SODIUM CHLORIDE 0.9 % IV BOLUS
1000.0000 mL | Freq: Once | INTRAVENOUS | Status: AC
  Administered 2018-09-29: 1000 mL via INTRAVENOUS

## 2018-09-29 MED ORDER — INSULIN ASPART 100 UNIT/ML ~~LOC~~ SOLN
5.0000 [IU] | Freq: Three times a day (TID) | SUBCUTANEOUS | Status: DC
  Administered 2018-09-30 (×2): 5 [IU] via SUBCUTANEOUS

## 2018-09-29 MED ORDER — OXYCODONE-ACETAMINOPHEN 5-325 MG PO TABS
1.0000 | ORAL_TABLET | Freq: Four times a day (QID) | ORAL | Status: DC | PRN
  Administered 2018-09-29 – 2018-10-01 (×3): 1 via ORAL
  Filled 2018-09-29 (×3): qty 1

## 2018-09-29 MED ORDER — ENOXAPARIN SODIUM 30 MG/0.3ML ~~LOC~~ SOLN: 30.0000 mg | SUBCUTANEOUS | Status: DC

## 2018-09-29 MED ORDER — SODIUM CHLORIDE 0.9 % IV SOLN
INTRAVENOUS | Status: DC
  Administered 2018-09-30: 02:00:00 via INTRAVENOUS

## 2018-09-29 MED ORDER — INSULIN REGULAR(HUMAN) IN NACL 100-0.9 UT/100ML-% IV SOLN
INTRAVENOUS | Status: DC
  Administered 2018-09-29: 4 [IU]/h via INTRAVENOUS
  Administered 2018-09-29: 10.8 [IU]/h via INTRAVENOUS
  Administered 2018-09-29: 21.6 [IU]/h via INTRAVENOUS
  Administered 2018-09-29: 17 [IU]/h via INTRAVENOUS
  Administered 2018-09-29: 16.2 [IU]/h via INTRAVENOUS
  Administered 2018-09-29: 19.9 [IU]/h via INTRAVENOUS
  Administered 2018-09-29: 18.3 [IU]/h via INTRAVENOUS
  Filled 2018-09-29 (×2): qty 100

## 2018-09-29 MED ORDER — DEXTROSE-NACL 5-0.45 % IV SOLN: INTRAVENOUS | Status: DC

## 2018-09-29 MED ORDER — INSULIN REGULAR(HUMAN) IN NACL 100-0.9 UT/100ML-% IV SOLN
INTRAVENOUS | Status: DC
  Administered 2018-09-29: 1.8 [IU]/h via INTRAVENOUS
  Filled 2018-09-29: qty 100

## 2018-09-29 MED ORDER — INSULIN GLARGINE 100 UNIT/ML ~~LOC~~ SOLN
15.0000 [IU] | Freq: Every day | SUBCUTANEOUS | Status: DC
  Administered 2018-09-29 – 2018-09-30 (×2): 15 [IU] via SUBCUTANEOUS
  Filled 2018-09-29 (×3): qty 0.15

## 2018-09-29 MED ORDER — INSULIN ASPART 100 UNIT/ML ~~LOC~~ SOLN
0.0000 [IU] | Freq: Three times a day (TID) | SUBCUTANEOUS | Status: DC
  Administered 2018-09-30: 5 [IU] via SUBCUTANEOUS
  Administered 2018-09-30 (×2): 2 [IU] via SUBCUTANEOUS

## 2018-09-29 MED ORDER — ONDANSETRON HCL 4 MG/2ML IJ SOLN
4.0000 mg | Freq: Once | INTRAMUSCULAR | Status: AC
  Administered 2018-09-29: 4 mg via INTRAVENOUS
  Filled 2018-09-29: qty 2

## 2018-09-29 MED ORDER — SODIUM CHLORIDE 0.9 % IV SOLN: INTRAVENOUS | Status: AC

## 2018-09-29 MED ORDER — SODIUM CHLORIDE 0.9 % IV SOLN: 1.0000 g | Freq: Once | INTRAVENOUS | Status: DC

## 2018-09-29 MED ORDER — SODIUM CHLORIDE 0.9 % IV SOLN
INTRAVENOUS | Status: DC
  Administered 2018-09-29: 13:00:00 via INTRAVENOUS

## 2018-09-29 MED ORDER — OXYCODONE HCL 5 MG PO TABS
5.0000 mg | ORAL_TABLET | Freq: Four times a day (QID) | ORAL | Status: DC | PRN
  Administered 2018-09-30 – 2018-10-01 (×2): 5 mg via ORAL
  Filled 2018-09-29 (×2): qty 1

## 2018-09-29 MED ORDER — CALCIUM GLUCONATE-NACL 1-0.675 GM/50ML-% IV SOLN
1.0000 g | Freq: Once | INTRAVENOUS | Status: AC
  Administered 2018-09-29: 1000 mg via INTRAVENOUS
  Filled 2018-09-29: qty 50

## 2018-09-29 NOTE — ED Provider Notes (Signed)
[facility] EMERGENCY DEPARTMENT Provider Note   CSN: [id number] Arrival date & time: 09/29/18  1448    History   Chief Complaint Chief Complaint  Patient presents with  . Altered Mental Status    HPI [name] is a 48 y.o. female.     Level 5 caveat for acuity of condition.  Patient brought through front entrance in private vehicle.  She was unable to get out of the car and is minimally responsive.  She is apparently a type I diabetic who sugar has been reading high for the past several hours.  Patient unable to give any history on her own.  She is confused and hypotensive.  CBG greater than 600.  Patient's fianc states she has been like this for "several hours".  He believes that her insulin pump is been malfunctioning.  States her sugar was reading high at home yesterday and she had several episodes of vomiting. No documented fever, cough, runny nose, sore throat.  The history is provided by the patient, the EMS personnel and a relative. The history is limited by the condition of the patient.  Altered Mental Status    Past Medical History:  Diagnosis Date  . Benign paroxysmal vertigo   . Cervicalgia   . Depression   . Diabetes ([place])    Type1  . Diabetes mellitus without complication ([place])   . GERD (gastroesophageal reflux disease)   . Hiatal hernia   . History of cardiac catheterization    Normal coronary arteries 2016  . History of TIA (transient ischemic attack)   . Hyperlipidemia   . Hypertension   . IBS (irritable bowel syndrome)   . Migraine headache   . Panic disorder with agoraphobia   . Peripheral neuropathy   . Subungual hematoma of digit of hand   . Tendonitis   . Vertigo     Patient Active Problem List   Diagnosis Date Noted  . S/P carpal tunnel release right 08/24/18 09/14/2018  . Carpal tunnel syndrome of right wrist   . Uncontrolled diabetes mellitus ([place]) 01/06/2018  . Depression 01/06/2018  . Other constipation 01/05/2018  .  Family hx of colon cancer 11/10/2017  . Diabetes ([place]) 11/08/2017  . Chest pain 03/25/2015  . 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  . Migraine 03/25/2015  . Hypertension   . Hyperlipidemia   . Anxiety   . Pain in the chest   . Essential hypertension   . Poorly controlled type 1 diabetes mellitus with peripheral neuropathy ([place]) 10/28/2014  . TIA (transient ischemic attack) 08/17/2014  . Tobacco abuse   . Gastro-esophageal reflux disease without esophagitis 01/28/2014  . Peripheral neuropathy 11/05/2013    Past Surgical History:  Procedure Laterality Date  . ABDOMINAL HYSTERECTOMY    . APPENDECTOMY    . CARDIAC CATHETERIZATION   last in 2009   x 4, normal coronary arteries  . CARDIAC CATHETERIZATION N/A 03/26/2015   Procedure: Left Heart Cath and Coronary Angiography;  Surgeon: Wellington Hampshire, MD;  Location: [facility];  Service: Cardiovascular;  Laterality: N/A;  . CARPAL TUNNEL RELEASE Right 08/24/2018   Procedure: CARPAL TUNNEL RELEASE;  Surgeon: Carole Civil, MD;  Location: [facility];  Service: Orthopedics;  Laterality: Right;  . CESAREAN SECTION    . CHOLECYSTECTOMY    . COLONOSCOPY WITH PROPOFOL N/A 01/09/2018   Procedure: COLONOSCOPY WITH PROPOFOL;  Surgeon: Rogene Houston, MD;  Location: [facility];  Service: Endoscopy;  Laterality: N/A;  .  CYST EXCISION     right breast  . ESOPHAGOGASTRODUODENOSCOPY (EGD) WITH PROPOFOL N/A 01/09/2018   Procedure: ESOPHAGOGASTRODUODENOSCOPY (EGD) WITH PROPOFOL;  Surgeon: Rogene Houston, MD;  Location: [facility];  Service: Endoscopy;  Laterality: N/A;  . 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 TUBAL LIGATION       OB History   No obstetric history on file.      Home Medications    Prior to  Admission medications   Medication Sig Start Date End Date Taking? Authorizing Provider  aspirin EC 81 MG tablet Take 1 tablet (81 mg total) by mouth daily. 05/03/18   Satira Sark, MD  Calcium Polycarbophil (FIBERCON PO) Take 1,000 mg by mouth daily.     [provider]  cholecalciferol (VITAMIN D3) 25 MCG (1000 UT) tablet Take 1,000 Units by mouth daily.    [provider]  Continuous Blood Gluc Sensor (FREESTYLE LIBRE 14 DAY SENSOR) MISC 1 each by Does not apply route every 14 (fourteen) days. 08/04/18   Philemon Kingdom, MD  diazepam (VALIUM) 10 MG tablet Take 10 mg by mouth every 8 (eight) hours as needed for anxiety.  12/27/17   [provider]  dimenhyDRINATE (DRAMAMINE) 50 MG tablet Take 50 mg by mouth every 8 (eight) hours as needed for nausea.  03/16/18   [provider]  docusate sodium (COLACE) 100 MG capsule Take 100-200 mg by mouth See admin instructions. Take 100 mg by mouth in the morning and 200 mg in the evening    [provider]  EPIPEN 2-PAK 0.3 MG/0.3ML SOAJ injection Inject 0.3 mg into the muscle once.  02/13/16   [provider]  estradiol (ESTRACE) 1 MG tablet Take 1 mg by mouth daily.    [provider]  GLUCAGEN HYPOKIT 1 MG SOLR injection Inject 1 mg into the skin once as needed for low blood sugar.  03/16/18   [provider]  ibuprofen (ADVIL,MOTRIN) 800 MG tablet Take 800 mg by mouth every 8 (eight) hours as needed for moderate pain.    [provider]  insulin aspart (NOVOLOG) 100 UNIT/ML injection Inject 62 units via pump daily Patient taking differently: Inject 30-40 Units into the skin daily. Inject 30-40 units via pump daily 07/14/18   Philemon Kingdom, MD  lisinopril (PRINIVIL,ZESTRIL) 5 MG tablet Take 5 mg by mouth daily. 06/10/15   [provider]  LYRICA 200 MG capsule Take 200 mg by mouth 2 (two) times daily.  08/19/15   [provider]  Melatonin 5 MG TABS Take  10 mg by mouth at bedtime as needed (sleep).  03/16/18   [provider]  Multiple Vitamin (MULTIVITAMIN WITH MINERALS) TABS tablet Take 1 tablet by mouth daily.    [provider]  omega-3 fish oil (MAXEPA) 1000 MG CAPS capsule Take 1 capsule by mouth daily.  03/16/18   [provider]  oxyCODONE-acetaminophen (PERCOCET) 10-325 MG tablet Take 1 tablet by mouth every 6 (six) hours as needed for pain. 08/24/18 08/24/19  Carole Civil, MD  pravastatin (PRAVACHOL) 10 MG tablet Take 10 mg by mouth at bedtime. 05/29/15   [provider]  tiZANidine (ZANAFLEX) 4 MG tablet Take 4 mg by mouth every 8 (eight) hours as needed for muscle spasms.     [provider]  vitamin B-12 (CYANOCOBALAMIN) 1000 MCG tablet Take 2,000 mcg by mouth daily.    [provider]    Family History Family History  Problem Relation Age of Onset  . Stroke Mother 52  . Heart attack Mother 51  . Cancer Sister        colon  . Heart failure Maternal Grandmother 65  . Cancer Maternal Grandfather        prostate  . Heart failure Paternal Grandmother 81  . Heart failure Paternal Grandfather 82    Social History Social History   Tobacco Use  . Smoking status: Current Every Day Smoker    Packs/day: 0.50    Years: 11.00    Pack years: 5.50    Types: Cigarettes  . Smokeless tobacco: Never Used  Substance Use Topics  . Alcohol use: Yes    Alcohol/week: 0.0 standard drinks    Comment: occasionally  . Drug use: No     Allergies   Zocor [simvastatin]; Clindamycin/lincomycin; Codeine; Penicillins; and Sulfa antibiotics   Review of Systems Review of Systems  Unable to perform ROS: Acuity of condition     Physical Exam Updated Vital Signs BP (!) 87/42 (BP Location: Left Arm)   Pulse (!) 104   Resp (!) 28   Ht 5\' 7"  (1.702 m)   Wt 73 kg   SpO2 100%   BMI 25.21 kg/m   Physical Exam Vitals signs and nursing note reviewed.  Constitutional:       General: She is not in acute distress.    Appearance: She is well-developed.     Comments: Obtunded, tachypneic, moans but does not provide any history or answer any questions  HENT:     Head: Normocephalic and atraumatic.     Mouth/Throat:     Mouth: Mucous membranes are dry.     Pharynx: No oropharyngeal exudate.  Eyes:     Conjunctiva/sclera: Conjunctivae normal.     Pupils: Pupils are equal, round, and reactive to light.  Neck:     Musculoskeletal: Normal range of motion and neck supple.     Comments: No meningismus. Cardiovascular:     Rate and Rhythm: Regular rhythm. Tachycardia present.     Heart sounds: Normal heart sounds. No murmur.  Pulmonary:     Effort: Pulmonary effort is normal. No respiratory distress.     Breath sounds: Normal breath sounds.  Abdominal:     Palpations: Abdomen is soft.     Tenderness: There is no abdominal tenderness. There is no guarding or rebound.  Musculoskeletal: Normal range of motion.        General: No tenderness.  Skin:    General: Skin is warm.     Capillary Refill: Capillary refill takes less than 2 seconds.  Neurological:     Mental Status: She is alert.     Cranial Nerves: No cranial nerve deficit.     Motor: No abnormal muscle tone.     Coordination: Coordination normal.     Comments: Patient is obtunded, protecting her airway, moves all extremities to commands intermittently.  Psychiatric:        Behavior: Behavior normal.      ED Treatments / Results  Labs (all labs ordered are listed, but only abnormal results are displayed) Labs Reviewed  BASIC METABOLIC PANEL - Abnormal; Notable for the following components:      Result Value   Sodium 124 (*)    Potassium 6.7 (*)    Chloride 86 (*)  CO2 4 (*)    Glucose, Bld 1,185 (*)    BUN 53 (*)    Creatinine, Ser 2.98 (*)    Calcium 8.5 (*)    GFR calc non Af Amer 18 (*)    GFR calc Af Amer 21 (*)    All other components within normal limits  BLOOD GAS, VENOUS -  Abnormal; Notable for the following components:   pH, Ven 6.980 (*)    pCO2, Ven 25.3 (*)    Bicarbonate 6.9 (*)    Acid-base deficit 23.5 (*)    All other components within normal limits  CBC WITH DIFFERENTIAL/PLATELET - Abnormal; Notable for the following components:   WBC 25.1 (*)    Neutro Abs 21.4 (*)    Monocytes Absolute 1.5 (*)    Abs Immature Granulocytes 0.25 (*)    All other components within normal limits  LACTIC ACID, PLASMA - Abnormal; Notable for the following components:   Lactic Acid, Venous 6.0 (*)    All other components within normal limits  CBG MONITORING, ED - Abnormal; Notable for the following components:   Glucose-Capillary >600 (*)    All other components within normal limits  CBG MONITORING, ED - Abnormal; Notable for the following components:   Glucose-Capillary >600 (*)    All other components within normal limits  CBG MONITORING, ED - Abnormal; Notable for the following components:   Glucose-Capillary >600 (*)    All other components within normal limits  URINALYSIS, ROUTINE W REFLEX MICROSCOPIC  LACTIC ACID, PLASMA  RAPID URINE DRUG SCREEN, HOSP PERFORMED  BASIC METABOLIC PANEL  I-STAT BETA HCG BLOOD, ED ([facility], [facility], [facility] ONLY)  CBG MONITORING, ED    EKG EKG Interpretation  Date/Time:  Friday September 29 2018 06:52:55 EST Ventricular Rate:  97 PR Interval:    QRS Duration: 103 QT Interval:  377 QTC Calculation: 479 R Axis:   105 Text Interpretation:  Sinus tachycardia Paired ventricular premature complexes Borderline prolonged PR interval Right axis deviation ST elev, probable normal early repol pattern Confirmed by Ezequiel Essex [phone]) on 09/29/2018 7:42:00 AM   Radiology Dg Chest Portable 1 View  Result Date: 09/29/2018 CLINICAL DATA:  Altered mental status. EXAM: PORTABLE CHEST 1 VIEW COMPARISON:  04/29/2018 FINDINGS: Slightly decreased lung volumes compared to the previous examination. Vascular structures are mildly prominent. No overt  pulmonary edema. No focal airspace disease. Heart and mediastinum are within normal limits and stable. Negative for a pneumothorax. Bone structures are unremarkable. IMPRESSION: No active disease. Electronically Signed   By: Markus Daft M.D.   On: 09/29/2018 07:43    Procedures Procedures (including critical care time)  Medications Ordered in ED Medications  insulin regular, human (MYXREDLIN) 100 units/ 100 mL infusion (5.4 Units/hr Intravenous Rate/Dose Verify 09/29/18 0718)  sodium chloride 0.9 % bolus 1,000 mL (has no administration in time range)    And  sodium chloride 0.9 % bolus 1,000 mL (has no administration in time range)    And  0.9 %  sodium chloride infusion (has no administration in time range)  dextrose 5 %-0.45 % sodium chloride infusion (has no administration in time range)  ondansetron (ZOFRAN) injection 4 mg (4 mg Intravenous Given 09/29/18 0702)     Initial Impression / Assessment and Plan / ED Course  I have reviewed the triage vital signs and the nursing notes.  Pertinent labs & imaging results that were available during my care of the patient were reviewed by me and considered in my medical  decision making (see chart for details).       Type I diabetic who is altered with elevated blood sugar.  High concern for DKA. Hypotensive and tachypneic.  IV access established, EKG shows peaked T waves.  Patient started on IV fluid resuscitation and IV insulin.  Her insulin pump was removed.  Labs show severe DKA with sugar 1185, potassium 6.7, bicarb 4. PH6.98  Patient given IV calcium for hyperkalemia. Continue IV insulin and IVF. Suspect K will correct with treatment. Will hold Bicarbonate at this time.  Patient protecting airway, becoming more responsive and answering some questions.  Admission d/w Dr. Roderic Palau who feels that patient can stay at [facility].  CRITICAL CARE Performed by: Ezequiel Essex Total critical care time: 60 minutes Critical care time was  exclusive of separately billable procedures and treating other patients. Critical care was necessary to treat or prevent imminent or life-threatening deterioration. Critical care was time spent personally by me on the following activities: development of treatment plan with patient and/or surrogate as well as nursing, discussions with consultants, evaluation of patient's response to treatment, examination of patient, obtaining history from patient or surrogate, ordering and performing treatments and interventions, ordering and review of laboratory studies, ordering and review of radiographic studies, pulse oximetry and re-evaluation of patient's condition.  Final Clinical Impressions(s) / ED Diagnoses   Final diagnoses:  Diabetic ketoacidosis with coma associated with type 1 diabetes mellitus [facility])  Hyperkalemia    ED Discharge Orders    None       Ezequiel Essex, MD 09/29/18 [phone]

## 2018-09-29 NOTE — ED Triage Notes (Signed)
Patient type 1 diabetic patient brought in  POV  for altered mental status by her husband, CBG reading high at this time.

## 2018-09-29 NOTE — H&P (Signed)
History and Physical    [name]:[id number] DOB: [birth year] DOA: 09/29/2018  PCP: Denny Levy, PA  Patient coming from: home  I have personally briefly reviewed patient's old medical records in [place]  Chief Complaint: Lethargy, elevated blood sugars  HPI: [name] is a 48 y.o. female with medical history significant of type 1 diabetes who is on insulin pump, is brought to the hospital with increasing lethargy and elevated blood sugars.  Patient and family report that her blood sugars have been running high, in the 600 range for the last 2 to 3 days.  She is began having vomiting yesterday.  She denies any polyuria.  She has not had any fever, diarrhea, cough.  They feel that her insulin pump may have stopped working.  She denies any chest pain.  She was brought to the emergency room via private vehicle and was noted to be extremely lethargic and not responding.  Basic labs indicated a markedly elevated blood sugar over 1000 mg/dl, serum bicarb of 4, creatinine of 2.9.  She was noted to be hypotensive and tachycardic.  Patient was started on intravenous fluids, intravenous insulin and referred for admission for further treatment of DKA.  By the time of my assessment, patient is already more responsive and is able to participate somewhat with history.  Review of Systems: As per HPI otherwise 10 point review of systems negative.    Past Medical History:  Diagnosis Date  . Benign paroxysmal vertigo   . Cervicalgia   . Depression   . Diabetes ([place])    Type1  . Diabetes mellitus without complication ([place])   . GERD (gastroesophageal reflux disease)   . Hiatal hernia   . History of cardiac catheterization    Normal coronary arteries 2016  . History of TIA (transient ischemic attack)   . Hyperlipidemia   . Hypertension   . IBS (irritable bowel syndrome)   . Migraine headache   . Panic disorder with agoraphobia   . Peripheral neuropathy   . Subungual  hematoma of digit of hand   . Tendonitis   . Vertigo     Past Surgical History:  Procedure Laterality Date  . ABDOMINAL HYSTERECTOMY    . APPENDECTOMY    . CARDIAC CATHETERIZATION   last in 2009   x 4, normal coronary arteries  . CARDIAC CATHETERIZATION N/A 03/26/2015   Procedure: Left Heart Cath and Coronary Angiography;  Surgeon: Wellington Hampshire, MD;  Location: [facility];  Service: Cardiovascular;  Laterality: N/A;  . CARPAL TUNNEL RELEASE Right 08/24/2018   Procedure: CARPAL TUNNEL RELEASE;  Surgeon: Carole Civil, MD;  Location: [facility];  Service: Orthopedics;  Laterality: Right;  . CESAREAN SECTION    . CHOLECYSTECTOMY    . COLONOSCOPY WITH PROPOFOL N/A 01/09/2018   Procedure: COLONOSCOPY WITH PROPOFOL;  Surgeon: Rogene Houston, MD;  Location: [facility];  Service: Endoscopy;  Laterality: N/A;  . CYST EXCISION     right breast  . ESOPHAGOGASTRODUODENOSCOPY (EGD) WITH PROPOFOL N/A 01/09/2018   Procedure: ESOPHAGOGASTRODUODENOSCOPY (EGD) WITH PROPOFOL;  Surgeon: Rogene Houston, MD;  Location: [facility];  Service: Endoscopy;  Laterality: N/A;  . 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 TUBAL LIGATION      Social History:  reports that  she has been smoking cigarettes. She has a 5.50 pack-year smoking history. She has never used smokeless tobacco. She reports current alcohol use. She reports that she does not use drugs.  Allergies  Allergen Reactions  . Zocor [Simvastatin] Other (See Comments)    Muscle aches and pains  . Clindamycin/Lincomycin Rash  . Codeine Rash  . Penicillins Rash    Has patient had a PCN reaction causing immediate rash, facial/tongue/throat swelling, SOB or lightheadedness with hypotension: Yes Has patient had a PCN reaction causing severe rash  involving mucus membranes or skin necrosis: Yes Has patient had a PCN reaction that required hospitalization: Yes Has patient had a PCN reaction occurring within the last 10 years: No If all of the above answers are "NO", then may proceed with Cephalosporin use.   . Sulfa Antibiotics Rash         Family History  Problem Relation Age of Onset  . Stroke Mother 31  . Heart attack Mother 39  . Cancer Sister        colon  . Heart failure Maternal Grandmother 65  . Cancer Maternal Grandfather        prostate  . Heart failure Paternal Grandmother 43  . Heart failure Paternal Grandfather 60     Prior to Admission medications   Medication Sig Start Date End Date Taking? Authorizing Provider  aspirin EC 81 MG tablet Take 1 tablet (81 mg total) by mouth daily. 05/03/18   Satira Sark, MD  Calcium Polycarbophil (FIBERCON PO) Take 1,000 mg by mouth daily.     [provider]  cholecalciferol (VITAMIN D3) 25 MCG (1000 UT) tablet Take 1,000 Units by mouth daily.    [provider]  Continuous Blood Gluc Sensor (FREESTYLE LIBRE 14 DAY SENSOR) MISC 1 each by Does not apply route every 14 (fourteen) days. 08/04/18   Philemon Kingdom, MD  diazepam (VALIUM) 10 MG tablet Take 10 mg by mouth every 8 (eight) hours as needed for anxiety.  12/27/17   [provider]  dimenhyDRINATE (DRAMAMINE) 50 MG tablet Take 50 mg by mouth every 8 (eight) hours as needed for nausea.  03/16/18   [provider]  docusate sodium (COLACE) 100 MG capsule Take 100-200 mg by mouth See admin instructions. Take 100 mg by mouth in the morning and 200 mg in the evening    [provider]  EPIPEN 2-PAK 0.3 MG/0.3ML SOAJ injection Inject 0.3 mg into the muscle once.  02/13/16   [provider]  estradiol (ESTRACE) 1 MG tablet Take 1 mg by mouth daily.    [provider]  GLUCAGEN HYPOKIT 1 MG SOLR injection Inject 1 mg into the skin once as needed for low blood sugar.   03/16/18   [provider]  ibuprofen (ADVIL,MOTRIN) 800 MG tablet Take 800 mg by mouth every 8 (eight) hours as needed for moderate pain.    [provider]  insulin aspart (NOVOLOG) 100 UNIT/ML injection Inject 62 units via pump daily Patient taking differently: Inject 30-40 Units into the skin daily. Inject 30-40 units via pump daily 07/14/18   Philemon Kingdom, MD  lisinopril (PRINIVIL,ZESTRIL) 5 MG tablet Take 5 mg by mouth daily. 06/10/15   [provider]  LYRICA 200 MG capsule Take 200 mg by mouth 2 (two) times daily.  08/19/15   [provider]  Melatonin 5 MG TABS Take 10 mg by mouth at bedtime as needed (sleep).  03/16/18   [provider]  Multiple Vitamin (MULTIVITAMIN WITH MINERALS) TABS tablet Take 1 tablet by mouth daily.    [provider]  omega-3 fish oil (MAXEPA) 1000 MG CAPS capsule Take 1 capsule by mouth daily.  03/16/18   [provider]  oxyCODONE-acetaminophen (PERCOCET) 10-325 MG tablet Take 1 tablet by mouth every 6 (six) hours as needed for pain. 08/24/18 08/24/19  Carole Civil, MD  pravastatin (PRAVACHOL) 10 MG tablet Take 10 mg by mouth at bedtime. 05/29/15   [provider]  tiZANidine (ZANAFLEX) 4 MG tablet Take 4 mg by mouth every 8 (eight) hours as needed for muscle spasms.     [provider]  vitamin B-12 (CYANOCOBALAMIN) 1000 MCG tablet Take 2,000 mcg by mouth daily.    [provider]    Physical Exam: Vitals:   09/29/18 0800 09/29/18 0815 09/29/18 0830 09/29/18 0845  BP: (!) 100/48 (!) 104/52 (!) 101/57 (!) 103/45  Pulse: (!) 105 (!) 106 (!) 107 (!) 108  Resp: (!) 22 (!) 21 19 (!) 29  Temp:      SpO2: 100% 100% 100% 99%  Weight:      Height:        Constitutional: NAD, calm, comfortable Eyes: PERRL, lids and conjunctivae normal ENMT: Mucous membranes are dry. Posterior pharynx clear of any exudate or lesions.Normal dentition.  Neck: normal, supple, no  masses, no thyromegaly Respiratory: clear to auscultation bilaterally, no wheezing, no crackles. Normal respiratory effort. No accessory muscle use.  Cardiovascular: Regular rhythm, tachycardic, no murmurs / rubs / gallops. No extremity edema. 2+ pedal pulses. No carotid bruits.  Abdomen: no tenderness, no masses palpated. No hepatosplenomegaly. Bowel sounds positive.  Musculoskeletal: no clubbing / cyanosis. No joint deformity upper and lower extremities. Good ROM, no contractures. Normal muscle tone.  Skin: Healing incision over right palm without any signs of infection Neurologic: CN 2-12 grossly intact. Sensation intact, DTR normal. Strength 5/5 in all 4.  Psychiatric: Somnolent, wakes up to voice.  Follows commands.   Labs on Admission: I have personally reviewed following labs and imaging studies  CBC: Recent Labs  Lab 09/29/18 0650  WBC 25.1*  NEUTROABS 21.4*  HGB 13.3  HCT 43.9  MCV 99.3  PLT 144   Basic Metabolic Panel: Recent Labs  Lab 09/29/18 0650  NA 124*  K 6.7*  CL 86*  CO2 4*  GLUCOSE 1,185*  BUN 53*  CREATININE 2.98*  CALCIUM 8.5*   GFR: Estimated Creatinine Clearance: 22.5 mL/min (A) (by C-G formula based on SCr of 2.98 mg/dL (H)). Liver Function Tests: No results for input(s): AST, ALT, ALKPHOS, BILITOT, PROT, ALBUMIN in the last 168 hours. No results for input(s): LIPASE, AMYLASE in the last 168 hours. No results for input(s): AMMONIA in the last 168 hours. Coagulation Profile: No results for input(s): INR, PROTIME in the last 168 hours. Cardiac Enzymes: No results for input(s): CKTOTAL, CKMB, CKMBINDEX, TROPONINI in the last 168 hours. BNP (last 3 results) No results for input(s): PROBNP in the last 8760 hours. HbA1C: No results for input(s): HGBA1C in the last 72 hours. CBG: Recent Labs  Lab 09/29/18 0651 09/29/18 0745 09/29/18 0818  GLUCAP >600* >600* >600*   Lipid Profile: No results for input(s): CHOL, HDL, LDLCALC, TRIG, CHOLHDL,  LDLDIRECT in the last 72 hours. Thyroid Function Tests: No results for input(s): TSH, T4TOTAL, FREET4, T3FREE, THYROIDAB in the last 72 hours. Anemia Panel: No results for input(s): VITAMINB12, FOLATE, FERRITIN, TIBC, IRON, RETICCTPCT in the last 72 hours. Urine analysis:  Component Value Date/Time   COLORURINE STRAW (A) 01/21/2018 1905   APPEARANCEUR CLEAR 01/21/2018 1905   LABSPEC 1.018 01/21/2018 1905   PHURINE 6.0 01/21/2018 1905   GLUCOSEU >=500 (A) 01/21/2018 1905   HGBUR NEGATIVE 01/21/2018 1905   BILIRUBINUR NEGATIVE 01/21/2018 1905   KETONESUR NEGATIVE 01/21/2018 1905   PROTEINUR NEGATIVE 01/21/2018 1905   UROBILINOGEN 0.2 12/21/2014 0050   NITRITE NEGATIVE 01/21/2018 1905   LEUKOCYTESUR NEGATIVE 01/21/2018 1905    Radiological Exams on Admission: Dg Chest Portable 1 View  Result Date: 09/29/2018 CLINICAL DATA:  Altered mental status. EXAM: PORTABLE CHEST 1 VIEW COMPARISON:  04/29/2018 FINDINGS: Slightly decreased lung volumes compared to the previous examination. Vascular structures are mildly prominent. No overt pulmonary edema. No focal airspace disease. Heart and mediastinum are within normal limits and stable. Negative for a pneumothorax. Bone structures are unremarkable. IMPRESSION: No active disease. Electronically Signed   By: Markus Daft M.D.   On: 09/29/2018 07:43    EKG: Independently reviewed.  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 Diabetic ketoacidosis in a type I diabetic.  Possibly related to insulin pump malfunction.  Patient will be admitted to the stepdown unit and continued on intravenous insulin.  She will be aggressively hydrated with IV fluids.  Will check serial  chemistries. 2. Hyperkalemia.  Related to DKA.  Should correct with IV fluids and intravenous insulin 3. Acute kidney injury.  Related to dehydration and volume depletion.  Continue IV hydration and monitor urine output.  Hold ACE inhibitor. 4. Hyponatremia.  Pseudohyponatremia in setting of hyperglycemia.  Continue to monitor 5. Hyperlipidemia.  Restart statin once no longer n.p.o. 6. Neuropathy.  She is chronically on Lyrica.  Will resume once mental status has improved. 7. Hypertension.  Blood pressure still on the low side.  Continue to hold ACE inhibitor.  DVT prophylaxis: Lovenox Code Status: Full code Family Communication: Discussed with family at the bedside Disposition Plan: Discharge home once blood sugars are controlled Consults called:   Admission status: Inpatient, stepdown  Kathie Dike MD [facility]   If 7PM-7AM, please contact night-coverage [email or web address]   09/29/2018, 9:15 AM

## 2018-09-29 NOTE — ED Notes (Signed)
Date and time results received: 09/29/18 0838 (use smartphrase ".now" to insert current time)  Test: lac acid Critical Value: 6  Name of Provider Notified: dr Emelda Brothers  Orders Received? Or Actions Taken?: see chart

## 2018-09-29 NOTE — ED Notes (Signed)
Date and time results received: 09/29/18 1005 (use smartphrase ".now" to insert current time)  Test: glucose Critical Value: 879  Name of Provider Notified: dr   Orders Received? Or Actions Taken?: see chart

## 2018-09-29 NOTE — ED Notes (Signed)
Date and time results received: 09/29/18 @07 :20  Test: potassium 6.7, glucose 1185 Critical Value: potassium 6.7, glucose 1185  Name of Provider Notified: Dr Wyvonnia Dusky  Orders Received? Or Actions Taken?: no additional orders given,

## 2018-09-29 NOTE — ED Notes (Signed)
CRITICAL VALUE ALERT  Critical Value:  PH 6.980  Date & Time Notied:  09/29/2018 @ 6578  Provider Notified: Dr Wyvonnia Dusky  Orders Received/Actions taken: see new orders.

## 2018-09-29 NOTE — Progress Notes (Signed)
Inpatient Diabetes Program Recommendations  AACE/ADA: New Consensus Statement on Inpatient Glycemic Control   Target Ranges:  Prepandial:   less than 140 mg/dL      Peak postprandial:   less than 180 mg/dL (1-2 hours)      Critically ill patients:  140 - 180 mg/dL   Results for [name], [name] (MRN [id number]) as of 09/29/2018 07:53  Ref. Range 09/29/2018 06:51 09/29/2018 07:45  Glucose-Capillary Latest Ref Range: 70 - 99 mg/dL >600 (HH) >600 [facility])   Results for [name], [name] (MRN [id number]) as of 09/29/2018 07:53  Ref. Range 09/29/2018 06:50  Glucose Latest Ref Range: 70 - 99 mg/dL 1,185 (HH)   Review of Glycemic Control  Diabetes history: DM1 (makes NO insulin; will require basal, correction, and meal coverage insulin) Outpatient Diabetes medications: Medtronic 670G insulin pump with Novolog Current orders for Inpatient glycemic control: IV insulin per DKA order set  Inpatient Diabetes Program Recommendations:  Insulin - IV drip/GlucoStabilizer: Ordered IV insulin per DKA order set. IV insulin will be continued until acidosis is cleared. HgbA1C: Last A1C 8.5% on 08/22/18 indicating an average glucose of 197 mg/dl over the past 2-3 months.  NOTE:  Patient has Type 1 DM and uses a Medtronic 670G insulin pump with Novolog and also uses a FreeStyle Libre glucose sensor.  Patient is followed by Dr. Cruzita Lederer (Endocrinologist) and was last seen on 08/22/18. Per office note on 08/22/18 by Dr. Cruzita Lederer the following should be patient's insulin pump settings:   Basal Rates: 12A 0.8 units per hour 8A 0.8 units per hour  Total basal per day: 19.2 units  Insulin to Carb Ratio 12A 1:9 (1 unit for every 9 grams of carbs) 1P 1:9 (1 unit for every 9 grams of carbs)  Insulin Sensitivity Factor 1:45  (1 unit drops glucose 45 mg/dl)  Active insulin: 4 hours  Once acidosis is cleared and MD is ready to transition off IV insulin, patient could be transitioned back to her insulin pump (if patient  places a new infusion site and has called the 1-800 number on her insulin pump to troubleshoot the pump and be sure it is functioning properly) or she could be transitioned to SQ insulin regimen (would require basal, correction, and meal coverage since the patient makes NO insulin at all).  Will continue to follow while inpatient.  Thanks, Barnie Alderman, RN, MSN, CDE Diabetes Coordinator Inpatient Diabetes Program [phone] (Team Pager from 8am to 5pm)

## 2018-09-30 DIAGNOSIS — E1042 Type 1 diabetes mellitus with diabetic polyneuropathy: Secondary | ICD-10-CM

## 2018-09-30 DIAGNOSIS — E1065 Type 1 diabetes mellitus with hyperglycemia: Secondary | ICD-10-CM

## 2018-09-30 LAB — BASIC METABOLIC PANEL
Anion gap: 10 (ref 5–15)
Anion gap: 6 (ref 5–15)
Anion gap: 8 (ref 5–15)
Anion gap: 9 (ref 5–15)
BUN: 25 mg/dL — ABNORMAL HIGH (ref 6–20)
BUN: 28 mg/dL — ABNORMAL HIGH (ref 6–20)
BUN: 28 mg/dL — ABNORMAL HIGH (ref 6–20)
BUN: 31 mg/dL — ABNORMAL HIGH (ref 6–20)
CALCIUM: 8.1 mg/dL — AB (ref 8.9–10.3)
CO2: 16 mmol/L — AB (ref 22–32)
CO2: 16 mmol/L — ABNORMAL LOW (ref 22–32)
CO2: 18 mmol/L — ABNORMAL LOW (ref 22–32)
CO2: 19 mmol/L — ABNORMAL LOW (ref 22–32)
Calcium: 7.8 mg/dL — ABNORMAL LOW (ref 8.9–10.3)
Calcium: 7.9 mg/dL — ABNORMAL LOW (ref 8.9–10.3)
Calcium: 8 mg/dL — ABNORMAL LOW (ref 8.9–10.3)
Chloride: 111 mmol/L (ref 98–111)
Chloride: 111 mmol/L (ref 98–111)
Chloride: 112 mmol/L — ABNORMAL HIGH (ref 98–111)
Chloride: 116 mmol/L — ABNORMAL HIGH (ref 98–111)
Creatinine, Ser: 0.89 mg/dL (ref 0.44–1.00)
Creatinine, Ser: 0.92 mg/dL (ref 0.44–1.00)
Creatinine, Ser: 0.92 mg/dL (ref 0.44–1.00)
Creatinine, Ser: 0.98 mg/dL (ref 0.44–1.00)
GFR calc Af Amer: >60 mL/min (ref >60)
GFR calc Af Amer: >60 mL/min (ref >60)
GFR calc Af Amer: >60 mL/min (ref >60)
GFR calc Af Amer: >60 mL/min (ref >60)
GFR calc non Af Amer: >60 mL/min (ref >60)
GFR calc non Af Amer: >60 mL/min (ref >60)
GFR calc non Af Amer: >60 mL/min (ref >60)
GLUCOSE: 122 mg/dL — AB (ref 70–99)
Glucose, Bld: 159 mg/dL — ABNORMAL HIGH (ref 70–99)
Glucose, Bld: 200 mg/dL — ABNORMAL HIGH (ref 70–99)
Glucose, Bld: 153 mg/dL — ABNORMAL HIGH (ref 70–99)
Potassium: 3.9 mmol/L (ref 3.5–5.1)
Potassium: 4.1 mmol/L (ref 3.5–5.1)
Potassium: 4.3 mmol/L (ref 3.5–5.1)
Potassium: 4.3 mmol/L (ref 3.5–5.1)
SODIUM: 139 mmol/L (ref 135–145)
Sodium: 136 mmol/L (ref 135–145)
Sodium: 138 mmol/L (ref 135–145)
Sodium: 139 mmol/L (ref 135–145)

## 2018-09-30 LAB — URINALYSIS, ROUTINE W REFLEX MICROSCOPIC
BACTERIA UA: NONE SEEN
Bilirubin Urine: NEGATIVE
Glucose, UA: >=500 mg/dL — AB
Hgb urine dipstick: NEGATIVE
Ketones, ur: 20 mg/dL — AB
Leukocytes,Ua: NEGATIVE
Nitrite: NEGATIVE
Protein, ur: NEGATIVE mg/dL
Specific Gravity, Urine: 1.018 (ref 1.005–1.030)
pH: 5 (ref 5.0–8.0)

## 2018-09-30 LAB — GLUCOSE, CAPILLARY
Glucose-Capillary: 114 mg/dL — ABNORMAL HIGH (ref 70–99)
Glucose-Capillary: 123 mg/dL — ABNORMAL HIGH (ref 70–99)
Glucose-Capillary: 131 mg/dL — ABNORMAL HIGH (ref 70–99)
Glucose-Capillary: 145 mg/dL — ABNORMAL HIGH (ref 70–99)
Glucose-Capillary: 151 mg/dL — ABNORMAL HIGH (ref 70–99)
Glucose-Capillary: 207 mg/dL — ABNORMAL HIGH (ref 70–99)
Glucose-Capillary: 91 mg/dL (ref 70–99)

## 2018-09-30 LAB — MRSA PCR SCREENING: MRSA by PCR: NEGATIVE

## 2018-09-30 LAB — HEMOGLOBIN A1C
HEMOGLOBIN A1C: 8.8 % — AB (ref 4.8–5.6)
Mean Plasma Glucose: 205.86 mg/dL

## 2018-09-30 MED ORDER — DIAZEPAM 5 MG PO TABS: 10.0000 mg | ORAL_TABLET | Freq: Three times a day (TID) | ORAL | Status: DC | PRN

## 2018-09-30 MED ORDER — DIAZEPAM 5 MG PO TABS
5.0000 mg | ORAL_TABLET | Freq: Three times a day (TID) | ORAL | Status: DC | PRN
  Administered 2018-09-30: 5 mg via ORAL
  Filled 2018-09-30: qty 1

## 2018-09-30 MED ORDER — ASPIRIN EC 81 MG PO TBEC
81.0000 mg | DELAYED_RELEASE_TABLET | Freq: Every day | ORAL | Status: DC
  Administered 2018-09-30 – 2018-10-01 (×2): 81 mg via ORAL
  Filled 2018-09-30 (×2): qty 1

## 2018-09-30 MED ORDER — PREGABALIN 75 MG PO CAPS
200.0000 mg | ORAL_CAPSULE | Freq: Two times a day (BID) | ORAL | Status: DC
  Administered 2018-09-30 – 2018-10-01 (×3): 200 mg via ORAL
  Filled 2018-09-30 (×3): qty 2

## 2018-09-30 MED ORDER — DOCUSATE SODIUM 100 MG PO CAPS
100.0000 mg | ORAL_CAPSULE | Freq: Every morning | ORAL | Status: DC
  Administered 2018-09-30 – 2018-10-01 (×2): 100 mg via ORAL
  Filled 2018-09-30 (×2): qty 1

## 2018-09-30 MED ORDER — TIZANIDINE HCL 2 MG PO TABS: 4.0000 mg | ORAL_TABLET | Freq: Three times a day (TID) | ORAL | Status: DC | PRN

## 2018-09-30 MED ORDER — ENOXAPARIN SODIUM 40 MG/0.4ML ~~LOC~~ SOLN
40.0000 mg | SUBCUTANEOUS | Status: DC
  Administered 2018-09-30: 40 mg via SUBCUTANEOUS
  Filled 2018-09-30: qty 0.4

## 2018-09-30 MED ORDER — PRAVASTATIN SODIUM 10 MG PO TABS
10.0000 mg | ORAL_TABLET | Freq: Every day | ORAL | Status: DC
  Administered 2018-09-30: 10 mg via ORAL
  Filled 2018-09-30: qty 1

## 2018-09-30 MED ORDER — CHLORHEXIDINE GLUCONATE CLOTH 2 % EX PADS
6.0000 | MEDICATED_PAD | Freq: Every day | CUTANEOUS | Status: DC
  Administered 2018-10-01: 6 via TOPICAL

## 2018-09-30 MED ORDER — SODIUM CHLORIDE 0.45 % IV SOLN
INTRAVENOUS | Status: DC
  Administered 2018-09-30 – 2018-10-01 (×3): via INTRAVENOUS

## 2018-09-30 MED ORDER — INSULIN GLARGINE 100 UNIT/ML ~~LOC~~ SOLN
20.0000 [IU] | Freq: Every day | SUBCUTANEOUS | Status: DC
  Administered 2018-09-30: 20 [IU] via SUBCUTANEOUS
  Filled 2018-09-30 (×3): qty 0.2

## 2018-09-30 MED ORDER — DOCUSATE SODIUM 100 MG PO CAPS
200.0000 mg | ORAL_CAPSULE | Freq: Every day | ORAL | Status: DC
  Administered 2018-09-30: 200 mg via ORAL
  Filled 2018-09-30: qty 2

## 2018-09-30 MED ORDER — KETOROLAC TROMETHAMINE 30 MG/ML IJ SOLN
30.0000 mg | Freq: Once | INTRAMUSCULAR | Status: AC
  Administered 2018-09-30: 30 mg via INTRAVENOUS
  Filled 2018-09-30: qty 1

## 2018-09-30 NOTE — Progress Notes (Signed)
Patient still had the site attachment to her insulin pump in place and RN asked if she would remove it. When pt removed, the catheter was bent into an "L" shape. MD notified as this may have been the reason the pump malfunctioned. Per MD, pt may possibly begin to use her pump again in the morning after being assessed by attending. Her fiance is to bring her supplies.

## 2018-09-30 NOTE — Progress Notes (Signed)
PROGRESS NOTE    [name]  BMW:[id number] DOB: Sep 23, 1970 DOA: 09/29/2018 PCP: Denny Levy, PA    Brief Narrative:  48 year old female who has a history of type 1 diabetes and is on insulin pump, presented with severe hyperglycemia and diabetic ketoacidosis.  She was admitted for intravenous insulin and IV fluids.   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 Diabetic ketoacidosis in a type I diabetic.  Related to insulin pump malfunction.  She was treated with intravenous fluids and IV insulin.  Her anion gap is closed.  Blood sugars are better.  She was transitioned to Lantus nightly overnight.  She reports that she was previously taking Lantus twice daily prior to initiating the pump.  Will change Lantus to twice daily dosing and monitor blood sugars to ensure she does not have any major hyperglycemic/hypoglycemic events. 2. Acute kidney injury.  Improved with hydration.  Continue to monitor. 3. Hyperkalemia.  Improved with fluids and insulin. 4. Hyponatremia.  Pseudohyponatremia in setting of hyperglycemia.  Resolved. 5. Hyperlipidemia.  Restart statin. 6. Neuropathy.  She is chronically on Lyrica.  Will resume dosing. 7. Hypertension.  Blood pressure is currently stable.  She is chronically on ACE inhibitor.  Will hold for now. 8. Left hand pain.  Possibly due to carpal tunnel syndrome.  She recently had carpal tunnel release on the right side.  No signs of infection/swelling in the left hand.  Will apply brace for the left wrist.  Continue pain management.  Continue observation..   DVT prophylaxis: Lovenox Code Status: Full code Family Communication: Discussed with husband at the bedside Disposition Plan: Discharge home in a.m. if blood  sugars remained stable   Consultants:     Procedures:     Antimicrobials:       Subjective: Complains of pain in her left hand overnight.  Describes as stinging/numbing discomfort.  Overall she has not had any vomiting and is tolerating some solid food.  She was transitioned off the insulin infusion overnight  Objective: Vitals:   09/30/18 0700 09/30/18 0800 09/30/18 0825 09/30/18 0900  BP: (!) 90/48 (!) 99/50  (!) 108/56  Pulse: 90 88 93 90  Resp: (!) 21 10 (!) 21 19  Temp:   98.4 F (36.9 C)   TempSrc:   Oral   SpO2: 97% 97% 97% 96%  Weight:      Height:        Intake/Output Summary (Last 24 hours) at 09/30/2018 1140 Last data filed at 09/30/2018 0900 Gross per 24 hour  Intake 2837.35 ml  Output 1125 ml  Net 1712.35 ml   Filed Weights   09/29/18 0654 09/29/18 1504 09/30/18 0500  Weight: 73 kg 72.4 kg 73 kg    Examination:  General exam: Appears calm and comfortable  Respiratory system: Clear to auscultation. Respiratory effort normal. Cardiovascular system: S1 & S2 heard, RRR. No JVD, murmurs, rubs, gallops or clicks. No pedal edema. Gastrointestinal system: Abdomen is nondistended, soft and nontender. No organomegaly or masses felt. Normal bowel sounds heard. Central nervous system: Alert and oriented. No focal neurological deficits. Extremities: Symmetric 5 x 5 power.  Pulses are equal bilaterally.  Left hand is nontender to palpation.  Full range of motion.  No swelling, erythema or signs  of infection Skin: No rashes, lesions or ulcers Psychiatry: Judgement and insight appear normal. Mood & affect appropriate.     Data Reviewed: I have personally reviewed following labs and imaging studies  CBC: Recent Labs  Lab 09/29/18 0650  WBC 25.1*  NEUTROABS 21.4*  HGB 13.3  HCT 43.9  MCV 99.3  PLT 503   Basic Metabolic Panel: Recent Labs  Lab 09/29/18 0948 09/29/18 2023 09/30/18 0011 09/30/18 0428 09/30/18 0823  NA 131* 142 138 136 139  K 4.9  3.8 4.3 4.3 4.1  CL 101 115* 116* 112* 111  CO2 6* 20* 16* 16* 18*  GLUCOSE 879* 127* 122* 159* 200*  BUN 51* 32* 31* 28* 28*  CREATININE 2.52* 1.01* 0.92 0.92 0.98  CALCIUM 8.0* 8.2* 7.9* 8.0* 8.1*   GFR: Estimated Creatinine Clearance: 70.3 mL/min (by C-G formula based on SCr of 0.98 mg/dL). Liver Function Tests: No results for input(s): AST, ALT, ALKPHOS, BILITOT, PROT, ALBUMIN in the last 168 hours. No results for input(s): LIPASE, AMYLASE in the last 168 hours. No results for input(s): AMMONIA in the last 168 hours. Coagulation Profile: No results for input(s): INR, PROTIME in the last 168 hours. Cardiac Enzymes: No results for input(s): CKTOTAL, CKMB, CKMBINDEX, TROPONINI in the last 168 hours. BNP (last 3 results) No results for input(s): PROBNP in the last 8760 hours. HbA1C: Recent Labs    09/29/18 0650  HGBA1C 8.8*   CBG: Recent Labs  Lab 09/29/18 2325 09/30/18 0030 09/30/18 0123 09/30/18 0415 09/30/18 0826  GLUCAP 110* 123* 114* 151* 207*   Lipid Profile: No results for input(s): CHOL, HDL, LDLCALC, TRIG, CHOLHDL, LDLDIRECT in the last 72 hours. Thyroid Function Tests: No results for input(s): TSH, T4TOTAL, FREET4, T3FREE, THYROIDAB in the last 72 hours. Anemia Panel: No results for input(s): VITAMINB12, FOLATE, FERRITIN, TIBC, IRON, RETICCTPCT in the last 72 hours. Sepsis Labs: Recent Labs  Lab 09/29/18 0742 09/29/18 0948  LATICACIDVEN 6.0* 3.5*    Recent Results (from the past 240 hour(s))  MRSA PCR Screening     Status: None   Collection Time: 09/29/18  3:03 PM  Result Value Ref Range Status   MRSA by PCR NEGATIVE NEGATIVE Final    Comment:        The GeneXpert MRSA Assay (FDA approved for NASAL specimens only), is one component of a comprehensive MRSA colonization surveillance program. It is not intended to diagnose MRSA infection nor to guide or monitor treatment for MRSA infections. Performed at [facility], [address]  [place], [place] [address]          Radiology Studies: Dg Chest Portable 1 View  Result Date: 09/29/2018 CLINICAL DATA:  Altered mental status. EXAM: PORTABLE CHEST 1 VIEW COMPARISON:  04/29/2018 FINDINGS: Slightly decreased lung volumes compared to the previous examination. Vascular structures are mildly prominent. No overt pulmonary edema. No focal airspace disease. Heart and mediastinum are within normal limits and stable. Negative for a pneumothorax. Bone structures are unremarkable. IMPRESSION: No active disease. Electronically Signed   By: Markus Daft M.D.   On: 09/29/2018 07:43        Scheduled Meds: . Chlorhexidine Gluconate Cloth  6 each Topical Daily  . enoxaparin (LOVENOX) injection  40 mg Subcutaneous Q24H  . insulin aspart  0-15 Units Subcutaneous TID WC  . insulin aspart  0-5 Units Subcutaneous QHS  . insulin aspart  5 Units Subcutaneous TID WC  . insulin glargine  15 Units Subcutaneous QHS  . insulin glargine  20 Units Subcutaneous Daily  . ketorolac  30 mg Intravenous Once   Continuous Infusions: . sodium chloride 150 mL/hr at 09/30/18 0843     LOS: 1 day    Time spent: 90mins    Kathie Dike, MD [facility]   If 7PM-7AM, please contact night-coverage [email or web address]  09/30/2018, 11:40 AM

## 2018-10-01 LAB — HIV ANTIBODY (ROUTINE TESTING W REFLEX): HIV Screen 4th Generation wRfx: NONREACTIVE

## 2018-10-01 LAB — GLUCOSE, CAPILLARY: Glucose-Capillary: 108 mg/dL — ABNORMAL HIGH (ref 70–99)

## 2018-10-01 MED ORDER — INSULIN ASPART 100 UNIT/ML ~~LOC~~ SOLN: 30.0000 [IU] | Freq: Every day | SUBCUTANEOUS | Status: DC

## 2018-10-01 NOTE — Discharge Instructions (Signed)
Please monitor blood glucose more frequently for next 12 hours.  Suspend your basal rate if you have any blood sugars less than 90.  Your temporary basal rate will expire in 12 hours.  Please call your diabetes doctor and/or primary care provider if you have any difficulty with controlling your blood sugars.      IMPORTANT INFORMATION: PAY CLOSE ATTENTION   PHYSICIAN DISCHARGE INSTRUCTIONS  Follow with Primary care provider  Denny Levy, [place]  and other consultants as instructed your Hospitalist Physician  Starr IF SYMPTOMS COME BACK, WORSEN OR NEW PROBLEM DEVELOPS.   Please note: You were cared for by a hospitalist during your hospital stay. Every effort will be made to forward records to your primary care provider.  You can request that your primary care provider send for your hospital records if they have not received them.  Once you are discharged, your primary care physician will handle any further medical issues. Please note that NO REFILLS for any discharge medications will be authorized once you are discharged, as it is imperative that you return to your primary care physician (or establish a relationship with a primary care physician if you do not have one) for your post hospital discharge needs so that they can reassess your need for medications and monitor your lab values.  Please get a complete blood count and chemistry panel checked by your Primary MD at your next visit, and again as instructed by your Primary MD.  Get Medicines reviewed and adjusted: Please take all your medications with you for your next visit with your Primary MD  Laboratory/radiological data: Please request your Primary MD to go over all hospital tests and procedure/radiological results at the follow up, please ask your primary care provider to get all Hospital records sent to his/her office.  In some cases, they will be blood work, cultures and biopsy results pending  at the time of your discharge. Please request that your primary care provider follow up on these results.  If you are diabetic, please bring your blood sugar readings with you to your follow up appointment with primary care.    Please call and make your follow up appointments as soon as possible.    Also Note the following: If you experience worsening of your admission symptoms, develop shortness of breath, life threatening emergency, suicidal or homicidal thoughts you must seek medical attention immediately by calling 911 or calling your MD immediately  if symptoms less severe.  You must read complete instructions/literature along with all the possible adverse reactions/side effects for all the Medicines you take and that have been prescribed to you. Take any new Medicines after you have completely understood and accpet all the possible adverse reactions/side effects.   Do not drive when taking Pain medications or sleeping medications (Benzodiazepines)  Do not take more than prescribed Pain, Sleep and Anxiety Medications. It is not advisable to combine anxiety,sleep and pain medications without talking with your primary care practitioner  Special Instructions: If you have smoked or chewed Tobacco  in the last 2 yrs please stop smoking, stop any regular Alcohol  and or any Recreational drug use.  Wear Seat belts while driving.

## 2018-10-01 NOTE — Progress Notes (Signed)
Patient discharged to home, taken to car via wheelchair.

## 2018-10-01 NOTE — Discharge Summary (Signed)
Physician Discharge Summary  [name] JIR:[id number] DOB: [birth year] DOA: 09/29/2018  PCP: Denny Levy, PA  Endocrinologist: Dr. Cruzita Lederer  Admit date: 09/29/2018 Discharge date: 10/01/2018  Admitted From: Home  Disposition: Home   Recommendations for Outpatient Follow-up:  1. Follow up with PCP in 1 weeks 2. Follow up with endocrinologist in 2 weeks 3. Please provide emergency lantus to have available in case of insulin pump failure  Discharge Condition: STABLE   CODE STATUS: FULL    Brief Hospitalization Summary: Please see all hospital notes, images, labs for full details of the hospitalization. Dr. Blythe Stanford HPI: [name] is a 48 y.o. female with medical history significant of type 1 diabetes who is on insulin pump, is brought to the hospital with increasing lethargy and elevated blood sugars.  Patient and family report that her blood sugars have been running high, in the 600 range for the last 2 to 3 days.  She is began having vomiting yesterday.  She denies any polyuria.  She has not had any fever, diarrhea, cough.  They feel that her insulin pump may have stopped working.  She denies any chest pain.  She was brought to the emergency room via private vehicle and was noted to be extremely lethargic and not responding.  Basic labs indicated a markedly elevated blood sugar over 1000 mg/dl, serum bicarb of 4, creatinine of 2.9.  She was noted to be hypotensive and tachycardic.  Patient was started on intravenous fluids, intravenous insulin and referred for admission for further treatment of DKA.  By the time of my assessment, patient is already more responsive and is able to participate somewhat with history.    Brief Narrative:  48 year old female who has a history of type 1 diabetes and is on insulin pump, presented with severe hyperglycemia and diabetic ketoacidosis.  She was admitted for intravenous insulin and IV fluids.   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 Diabetic ketoacidosis in a type I diabetic.  RESOLVED.  Related to insulin pump malfunction.  Pt found a kink in her infusion set when she removed it from her skin.  It was bent just under the skin and likely not providing the correct insulin that she needed if at all.  She was treated with intravenous fluids and IV insulin.  Her anion gap is closed.  Blood sugars are better.  She was transitioned to Lantus.  She has now been resumed on her insulin pump.  Because she received a dose of lantus last evening, will have her do a 12 hour 50% reduction temporary basal and have her do frequent testing with instruction to suspend basal temporarily for any blood sugars less than 90.  She has been on the insulin pump for a couple of hours now and her blood sugar is in the 140s.  I spoke with the diabetes coordinator for assistance with setting the temporary basal in her Medtronic pump.  Pt was given instructions to monitor blood sugar closely.  Pt will also speak with her providers about having lantus available at home in case of an insulin pump failure.   2. Acute kidney injury.  Resolved now.  Improved with hydration.   3. Hyperkalemia.  RESOLVED.  Improved with fluids and insulin.  4. Relative Hyponatremia.  Pseudohyponatremia in setting of hyperglycemia.  Resolved. 5. Hyperlipidemia.  Restart statin. 6. Neuropathy.  She is chronically on Lyrica.  Will resume dosing. 7. Hypertension.  Blood pressure is currently stable.  She is chronically on ACE inhibitor.  Will hold for now. 8. Left hand pain.  Possibly due to carpal tunnel syndrome.  She recently had carpal tunnel release on the right side.  Follow up with her outpatient providers.  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)   Hypertension   Dehydration   AKI (acute kidney injury) ([place])   Hyperkalemia   Hyponatremia   Discharge Instructions: Discharge Instructions    Call MD for:  difficulty breathing, headache or visual disturbances   Complete by:  As directed    Call MD for:  extreme fatigue   Complete by:  As directed    Call MD for:  persistant dizziness or light-headedness   Complete by:  As directed    Call MD for:  persistant nausea and vomiting   Complete by:  As directed    Call MD for:  severe uncontrolled pain   Complete by:  As directed    Increase activity slowly   Complete by:  As directed      Allergies as of 10/01/2018      Reactions   Zocor [simvastatin] Other (See Comments)   Muscle aches and pains   Clindamycin/lincomycin Rash   Codeine Rash   Penicillins Rash   Has patient had a PCN reaction causing immediate rash, facial/tongue/throat swelling, SOB or lightheadedness with hypotension: Yes Has patient had a PCN reaction causing severe rash involving mucus membranes or skin necrosis: Yes Has patient had a PCN reaction that required hospitalization: Yes Has patient had a PCN reaction occurring within the last 10 years: No If all of the above answers are "NO", then may proceed with Cephalosporin use.   Sulfa Antibiotics Rash         Medication List    TAKE these medications   aspirin EC 81 MG tablet Take 1 tablet (81 mg total) by mouth daily.   cholecalciferol 25 MCG (1000 UT) tablet Commonly known as:  VITAMIN D3 Take 1,000 Units by mouth daily.   diazepam 10 MG tablet Commonly known as:   VALIUM Take 10 mg by mouth every 8 (eight) hours as needed for anxiety.   dimenhyDRINATE 50 MG tablet Commonly known as:  DRAMAMINE Take 50 mg by mouth every 8 (eight) hours as needed for nausea.   docusate sodium 100 MG capsule Commonly known as:  COLACE Take 100-200 mg by mouth See admin instructions. Take 100 mg by mouth in the morning and 200 mg in the evening   EPIPEN 2-PAK 0.3 mg/0.3 mL Soaj injection Generic drug:  EPINEPHrine Inject 0.3 mg into the muscle once.   estradiol 1 MG tablet Commonly known as:  ESTRACE Take 1 mg by mouth daily.   FIBERCON PO Take 1,000 mg by mouth daily.   FREESTYLE LIBRE 14 DAY SENSOR Misc 1 each by Does not apply route every 14 (fourteen) days.   GLUCAGEN HYPOKIT 1 MG Solr injection Generic drug:  glucagon Inject  1 mg into the skin once as needed for low blood sugar.   ibuprofen 800 MG tablet Commonly known as:  ADVIL,MOTRIN Take 800 mg by mouth every 8 (eight) hours as needed for moderate pain.   insulin aspart 100 UNIT/ML injection Commonly known as:  NOVOLOG Inject 30-40 Units into the skin daily. Inject 30-40 units via pump daily   lisinopril 5 MG tablet Commonly known as:  PRINIVIL,ZESTRIL Take 5 mg by mouth daily.   LYRICA 200 MG capsule Generic drug:  pregabalin Take 200 mg by mouth 2 (two) times daily.   Melatonin 5 MG Tabs Take 10 mg by mouth at bedtime as needed (sleep).   multivitamin with minerals Tabs tablet Take 1 tablet by mouth daily.   omega-3 fish oil 1000 MG Caps capsule Commonly known as:  MAXEPA Take 1 capsule by mouth daily.   oxyCODONE-acetaminophen 10-325 MG tablet Commonly known as:  PERCOCET Take 1 tablet by mouth every 6 (six) hours as needed for pain.   pravastatin 10 MG tablet Commonly known as:  PRAVACHOL Take 10 mg by mouth at bedtime.   tiZANidine 4 MG tablet Commonly known as:  ZANAFLEX Take 4 mg by mouth every 8 (eight) hours as needed for muscle spasms.   vitamin B-12 1000 MCG  tablet Commonly known as:  CYANOCOBALAMIN Take 2,000 mcg by mouth daily.      Follow-up Information    [place], [place]. Schedule an appointment as soon as possible for a visit in 1 week(s).   Specialty:  Family Medicine Contact information: [place] [address] [phone]        Satira Sark, MD .   Specialty:  Cardiology Contact information: [place] [address] [phone]        Philemon Kingdom, MD. Schedule an appointment as soon as possible for a visit in 2 week(s).   Specialty:  Internal Medicine Why:  Hospital Follow Up  Contact information: [address] [place] [address] [phone]          Allergies  Allergen Reactions  . Zocor [Simvastatin] Other (See Comments)    Muscle aches and pains  . Clindamycin/Lincomycin Rash  . Codeine Rash  . Penicillins Rash    Has patient had a PCN reaction causing immediate rash, facial/tongue/throat swelling, SOB or lightheadedness with hypotension: Yes Has patient had a PCN reaction causing severe rash involving mucus membranes or skin necrosis: Yes Has patient had a PCN reaction that required hospitalization: Yes Has patient had a PCN reaction occurring within the last 10 years: No If all of the above answers are "NO", then may proceed with Cephalosporin use.   . Sulfa Antibiotics Rash        Allergies as of 10/01/2018      Reactions   Zocor [simvastatin] Other (See Comments)   Muscle aches and pains   Clindamycin/lincomycin Rash   Codeine Rash   Penicillins Rash   Has patient had a PCN reaction causing immediate rash, facial/tongue/throat swelling, SOB or lightheadedness with hypotension: Yes Has patient had a PCN reaction causing severe rash involving mucus membranes or skin necrosis: Yes Has patient had a PCN reaction that required hospitalization: Yes Has patient had a PCN reaction occurring within the last 10 years: No If all of the  above answers are "NO", then may proceed with Cephalosporin use.   Sulfa Antibiotics Rash         Medication List  TAKE these medications   aspirin EC 81 MG tablet Take 1 tablet (81 mg total) by mouth daily.   cholecalciferol 25 MCG (1000 UT) tablet Commonly known as:  VITAMIN D3 Take 1,000 Units by mouth daily.   diazepam 10 MG tablet Commonly known as:  VALIUM Take 10 mg by mouth every 8 (eight) hours as needed for anxiety.   dimenhyDRINATE 50 MG tablet Commonly known as:  DRAMAMINE Take 50 mg by mouth every 8 (eight) hours as needed for nausea.   docusate sodium 100 MG capsule Commonly known as:  COLACE Take 100-200 mg by mouth See admin instructions. Take 100 mg by mouth in the morning and 200 mg in the evening   EPIPEN 2-PAK 0.3 mg/0.3 mL Soaj injection Generic drug:  EPINEPHrine Inject 0.3 mg into the muscle once.   estradiol 1 MG tablet Commonly known as:  ESTRACE Take 1 mg by mouth daily.   FIBERCON PO Take 1,000 mg by mouth daily.   FREESTYLE LIBRE 14 DAY SENSOR Misc 1 each by Does not apply route every 14 (fourteen) days.   GLUCAGEN HYPOKIT 1 MG Solr injection Generic drug:  glucagon Inject 1 mg into the skin once as needed for low blood sugar.   ibuprofen 800 MG tablet Commonly known as:  ADVIL,MOTRIN Take 800 mg by mouth every 8 (eight) hours as needed for moderate pain.   insulin aspart 100 UNIT/ML injection Commonly known as:  NOVOLOG Inject 30-40 Units into the skin daily. Inject 30-40 units via pump daily   lisinopril 5 MG tablet Commonly known as:  PRINIVIL,ZESTRIL Take 5 mg by mouth daily.   LYRICA 200 MG capsule Generic drug:  pregabalin Take 200 mg by mouth 2 (two) times daily.   Melatonin 5 MG Tabs Take 10 mg by mouth at bedtime as needed (sleep).   multivitamin with minerals Tabs tablet Take 1 tablet by mouth daily.   omega-3 fish oil 1000 MG Caps capsule Commonly known as:  MAXEPA Take 1 capsule by mouth daily.    oxyCODONE-acetaminophen 10-325 MG tablet Commonly known as:  PERCOCET Take 1 tablet by mouth every 6 (six) hours as needed for pain.   pravastatin 10 MG tablet Commonly known as:  PRAVACHOL Take 10 mg by mouth at bedtime.   tiZANidine 4 MG tablet Commonly known as:  ZANAFLEX Take 4 mg by mouth every 8 (eight) hours as needed for muscle spasms.   vitamin B-12 1000 MCG tablet Commonly known as:  CYANOCOBALAMIN Take 2,000 mcg by mouth daily.       Procedures/Studies: Dg Chest Portable 1 View  Result Date: 09/29/2018 CLINICAL DATA:  Altered mental status. EXAM: PORTABLE CHEST 1 VIEW COMPARISON:  04/29/2018 FINDINGS: Slightly decreased lung volumes compared to the previous examination. Vascular structures are mildly prominent. No overt pulmonary edema. No focal airspace disease. Heart and mediastinum are within normal limits and stable. Negative for a pneumothorax. Bone structures are unremarkable. IMPRESSION: No active disease. Electronically Signed   By: Markus Daft M.D.   On: 09/29/2018 07:43      Subjective: Pt feels much better, she is eating and drinking.  Her pump is back on and functioning.  BS is 140.    Discharge Exam: Vitals:   10/01/18 0737 10/01/18 0800  BP:  122/77  Pulse: 78 72  Resp: (!) 22 20  Temp: 98.4 F (36.9 C)   SpO2: 96% 96%   Vitals:   10/01/18 0600 10/01/18 0700 10/01/18 0737 10/01/18 0800  BP: 109/66  113/71  122/77  Pulse: 74 77 78 72  Resp: (!) 22 17 (!) 22 20  Temp: 98.7 F (37.1 C)  98.4 F (36.9 C)   TempSrc:   Oral   SpO2: 95% 95% 96% 96%  Weight:      Height:       General: Pt is alert, awake, not in acute distress Cardiovascular:  normal S1/S2 +, no rubs, no gallops Respiratory: CTA bilaterally, no wheezing, no rhonchi Abdominal: Soft, NT, ND, bowel sounds + Extremities: no edema, no cyanosis   The results of significant diagnostics from this hospitalization (including imaging, microbiology, ancillary and laboratory) are listed  below for reference.     Microbiology: Recent Results (from the past 240 hour(s))  MRSA PCR Screening     Status: None   Collection Time: 09/29/18  3:03 PM  Result Value Ref Range Status   MRSA by PCR NEGATIVE NEGATIVE Final    Comment:        The GeneXpert MRSA Assay (FDA approved for NASAL specimens only), is one component of a comprehensive MRSA colonization surveillance program. It is not intended to diagnose MRSA infection nor to guide or monitor treatment for MRSA infections. Performed at [facility], [address], [place], [place] [address]      Labs: BNP (last 3 results) No results for input(s): BNP in the last 8760 hours. Basic Metabolic Panel: Recent Labs  Lab 09/29/18 2023 09/30/18 0011 09/30/18 0428 09/30/18 0823 09/30/18 1205  NA 142 138 136 139 139  K 3.8 4.3 4.3 4.1 3.9  CL 115* 116* 112* 111 111  CO2 20* 16* 16* 18* 19*  GLUCOSE 127* 122* 159* 200* 153*  BUN 32* 31* 28* 28* 25*  CREATININE 1.01* 0.92 0.92 0.98 0.89  CALCIUM 8.2* 7.9* 8.0* 8.1* 7.8*   Liver Function Tests: No results for input(s): AST, ALT, ALKPHOS, BILITOT, PROT, ALBUMIN in the last 168 hours. No results for input(s): LIPASE, AMYLASE in the last 168 hours. No results for input(s): AMMONIA in the last 168 hours. CBC: Recent Labs  Lab 09/29/18 0650  WBC 25.1*  NEUTROABS 21.4*  HGB 13.3  HCT 43.9  MCV 99.3  PLT 230   Cardiac Enzymes: No results for input(s): CKTOTAL, CKMB, CKMBINDEX, TROPONINI in the last 168 hours. BNP: Invalid input(s): POCBNP CBG: Recent Labs  Lab 09/30/18 0826 09/30/18 1155 09/30/18 1601 09/30/18 2034 10/01/18 0741  GLUCAP 207* 131* 145* 91 108*   D-Dimer No results for input(s): DDIMER in the last 72 hours. Hgb A1c Recent Labs    09/29/18 0650  HGBA1C 8.8*   Lipid Profile No results for input(s): CHOL, HDL, LDLCALC, TRIG, CHOLHDL, LDLDIRECT in the last 72 hours. Thyroid function studies No results for input(s): TSH, T4TOTAL,  T3FREE, THYROIDAB in the last 72 hours.  Invalid input(s): FREET3 Anemia work up No results for input(s): VITAMINB12, FOLATE, FERRITIN, TIBC, IRON, RETICCTPCT in the last 72 hours. Urinalysis    Component Value Date/Time   COLORURINE YELLOW 09/30/2018 0430   APPEARANCEUR CLEAR 09/30/2018 0430   LABSPEC 1.018 09/30/2018 0430   PHURINE 5.0 09/30/2018 0430   GLUCOSEU >=500 (A) 09/30/2018 0430   HGBUR NEGATIVE 09/30/2018 0430   BILIRUBINUR NEGATIVE 09/30/2018 0430   KETONESUR 20 (A) 09/30/2018 0430   PROTEINUR NEGATIVE 09/30/2018 0430   UROBILINOGEN 0.2 12/21/2014 0050   NITRITE NEGATIVE 09/30/2018 0430   LEUKOCYTESUR NEGATIVE 09/30/2018 0430   Sepsis Labs Invalid input(s): PROCALCITONIN,  WBC,  LACTICIDVEN Microbiology Recent Results (from the past  240 hour(s))  MRSA PCR Screening     Status: None   Collection Time: 09/29/18  3:03 PM  Result Value Ref Range Status   MRSA by PCR NEGATIVE NEGATIVE Final    Comment:        The GeneXpert MRSA Assay (FDA approved for NASAL specimens only), is one component of a comprehensive MRSA colonization surveillance program. It is not intended to diagnose MRSA infection nor to guide or monitor treatment for MRSA infections. Performed at [facility], [address] [place], [place] [address]    Time coordinating discharge: 35 minutes   SIGNED:  Irwin Brakeman, MD  [facility] 10/01/2018, 11:08 AM How to contact the [facility] Attending or Consulting provider [place] or covering provider during after hours [place], for this patient?  1. Check the care team in [facility] and look for a) attending/consulting [facility] provider listed and b) the [facility] team listed 2. Log into [email or web address] and use [place]'s universal password to access. If you do not have the password, please contact the hospital operator. 3. Locate the [facility] provider you are looking for under [facility] and page to a number that you can be directly reached. 4. If you still  have difficulty reaching the provider, please page the [facility] (Director on Call) for the Hospitalists listed on amion for assistance.

## 2018-10-01 NOTE — Progress Notes (Signed)
Inpatient Diabetes Program Recommendations  AACE/ADA: New Consensus Statement on Inpatient Glycemic Control  Target Ranges:  Prepandial:   less than 140 mg/dL      Peak postprandial:   less than 180 mg/dL (1-2 hours)      Critically ill patients:  140 - 180 mg/dL   Results for LAURENCE, FOLZ (MRN [id number]) as of 10/01/2018 11:07  Ref. Range 09/30/2018 08:26 09/30/2018 11:55 09/30/2018 16:01 09/30/2018 20:34 10/01/2018 07:41  Glucose-Capillary Latest Ref Range: 70 - 99 mg/dL 207 (H) 131 (H) 145 (H) 91 108 (H)   Review of Glycemic Control  Diabetes history: DM1 Outpatient Diabetes medications: Medtronic insulin pump Current orders for Inpatient glycemic control: Has resumed Medtronic insulin pump  Recommendations: Please discontinue all SQ insulin orders and order insulin pump order set.  NOTE: Received page from Dr. Wynetta Emery regarding insulin pump. Patient has been allowed to resume her insulin pump and will be discharging home today. Patient received Lantus 20 units at 13:27 and Lantus 15 units @ 21:47 on 09/30/18 so Lantus will be active for 24 hours from administration time. Dr. Wynetta Emery has set a temporary basal of 50% of normal hourly basal settings for the next 12 hours. Spoke with patient over the phone and discussed temporary basal rate that was set by Dr. Wynetta Emery. Encouraged patient to check glucose frequently throughout the day (every 1-2 hours) to be sure glucose is not trending to low.    Discussed initial presentation of DKA. Patient states that she put on a new insulin infusion site on Wednesday and she began to feel worse and glucose was consistently elevated since then. Patient notes that she had excessive episodes of vomiting and states that her throat is raw.  Patient notes that when her old infusion site was removed on 09/30/18 it was kinked.  Encouraged patient that when a new infusion site is inserted, she needs to monitor glucose and if it starts to go up despite doing corrections  via pump, she needs to change infusion site as it may be kinked and not delivering any insulin.  Patient states that she realizes that now and she will be more mindful in the future.  Patient verbalized understanding of information discussed and she states that she has no further questions at this time.  Thanks, Barnie Alderman, RN, MSN, CDE Diabetes Coordinator Inpatient Diabetes Program [phone] (Team Pager from 8am to 5pm)

## 2018-10-03 ENCOUNTER — Telehealth: Payer: Self-pay

## 2018-10-03 NOTE — Telephone Encounter (Signed)
Left detailed VM requesting the patient call back to schedule if she feels she is not doing well- gave her MD advice from note below

## 2018-10-03 NOTE — Telephone Encounter (Signed)
Not necessarily if we know the cause of her DKA and she is now doing well.

## 2018-10-03 NOTE — Telephone Encounter (Signed)
Patient was admitted to the hospital due to DKA- she found out there was a kink in her cannula which caused the issue and now she is worried about the pump functioning properly- patient wants to know if she should be seen sooner than May please advise

## 2018-10-04 DIAGNOSIS — J019 Acute sinusitis, unspecified: Secondary | ICD-10-CM | POA: Diagnosis not present

## 2018-10-04 DIAGNOSIS — E1065 Type 1 diabetes mellitus with hyperglycemia: Secondary | ICD-10-CM | POA: Diagnosis not present

## 2018-10-04 NOTE — Telephone Encounter (Signed)
The patient has called and had me speak to Satira Anis had her family medicine stating that she needs to be seen as soon as possible with Dr.Gherghe but due to needing a 30 minute slot I could not find anything till June. Please Advise with patients office a [phone].

## 2018-10-04 NOTE — Telephone Encounter (Signed)
She did contact me after her hospitalization but this was due to a cannula problem.  Why do not we have her come and see Vaughan Basta for an appointment and download her pump and I will look over the downloads along with Vaughan Basta?

## 2018-10-04 NOTE — Telephone Encounter (Signed)
Message left on voicemail to call office to schedule a 1 hr. Visit with me on Monday.

## 2018-10-05 NOTE — Telephone Encounter (Signed)
Thank you so much, Linda!

## 2018-10-09 ENCOUNTER — Other Ambulatory Visit: Payer: Self-pay | Admitting: Neurology

## 2018-10-09 ENCOUNTER — Other Ambulatory Visit: Payer: Self-pay

## 2018-10-09 DIAGNOSIS — I639 Cerebral infarction, unspecified: Secondary | ICD-10-CM | POA: Diagnosis not present

## 2018-10-09 DIAGNOSIS — I69854 Hemiplegia and hemiparesis following other cerebrovascular disease affecting left non-dominant side: Secondary | ICD-10-CM | POA: Diagnosis not present

## 2018-10-09 DIAGNOSIS — G5603 Carpal tunnel syndrome, bilateral upper limbs: Secondary | ICD-10-CM | POA: Diagnosis not present

## 2018-10-09 DIAGNOSIS — F349 Persistent mood [affective] disorder, unspecified: Secondary | ICD-10-CM | POA: Diagnosis not present

## 2018-10-09 DIAGNOSIS — G8194 Hemiplegia, unspecified affecting left nondominant side: Secondary | ICD-10-CM

## 2018-10-09 NOTE — Patient Outreach (Signed)
[facility]) Care Management  10/09/2018  [name] 1971-02-20 [id number]    EMMI-General Discharge RED ON EMMI ALERT Day # 4 Date: 10/06/2018 Red Alert Reason:"Lost interest in things? Yes  Sad/hopeless/anxious/empty? Yes"    Outreach attempt # 1 to patient. Spoke with patient who states she is doing fairly well. She denies any acute issues or concerns at this time. Reviewed and addressed red alerts with patient. She shares the whole ordeal of her recent hospital stay. She reports that it is "discouraging and depressing because it was all related to a malfunctioning and recalled pump issues." She voices that she feels like she had a near death experience." Patient shares how she has prouded herself on managing her diabetes for so many years and feels that this was a set back almost.Emotional support given. Patient with mild depression. RN CM discussed seeking medial attention if symptoms worsen and/or unresolved. Patient voices that her cbgs have been fairly normal. She reports highest reading was 200. She states she was provided with a new insulin pump but is fearful to use it. She has been using SQ insulin instead. She is unable to get to see endocrinologist until the end of April and PCP is managing DM until then. Patient aware of s/s of worsening condition and when to seek medical attention. She voices some issue with her carpal tunnel and has neuro appt today. She denies any RN CM needs or concerns at this time. Patient appreciative of follow up call. She has completed post discharge automated calls.     Plan: RN CM will close case as no further interventions needed at this time.   Enzo Montgomery, RN,BSN,CCM [place] Management Telephonic Care Management Coordinator Direct Phone: [phone] Toll Free: [phone] Fax: [phone]

## 2018-10-10 ENCOUNTER — Ambulatory Visit: Payer: Medicare HMO | Admitting: Cardiology

## 2018-10-10 ENCOUNTER — Telehealth: Payer: Self-pay | Admitting: Nutrition

## 2018-10-10 ENCOUNTER — Telehealth: Payer: Self-pay | Admitting: *Deleted

## 2018-10-10 NOTE — Telephone Encounter (Signed)
Patient called saying she went to a Neurologist yesterday, and he thinks she has had a stroke on her left side.  She is very weak, having difficulty swallowing, etc.  She is afraid to go back on her pump, so her family doctor but her on: Lantus 20u BID, and Novolog sliding scale: 100-160--2u 161-220--4u 221-260--6u 261-320--8u 321-400--10u She is also doing a meal time insulin of 1u for every exchange (15 grams)  She is complaining of "lots of low blood sugars"--in the 40s and 50s--Every night at 3AM, and a few hours after eating. Suggested she eat only soft foods, or get some glucerna or boost.  Please advise

## 2018-10-10 NOTE — Telephone Encounter (Signed)
-----   Message from Satira Sark, MD sent at 10/10/2018  8:47 AM EDT ----- I reviewed the records as well.  Not entirely clear why she was referred back to see me, her recent hospital stay was due to DKA and she has follow-up with endocrinology.  There was no mention of cardiology concerns or evaluation.  You could check with the patient to see if she has other follow-up planned as this would likely be more productive.  Otherwise, if she is having any progressive chest pain symptoms that need evaluation, we can certainly see her.  She did not proceed with a stress test that we recommended at the consultation last October. ----- Message ----- From: Massie Maroon, CMA Sent: 10/09/2018   4:07 PM EDT To: Satira Sark, MD  This pt is scheduled as hospital f/u I don't see anything related to cardiology and her LOV she was to have stress test and didn't. Does she need to be seen?  Staci

## 2018-10-10 NOTE — Telephone Encounter (Signed)
Patient called back X2 and left a voice mail the second time to decrease the Lantus dose as per Dr. Arman Filter instruction to 15u BID.  She was told to call readings to the office in 2 days.

## 2018-10-10 NOTE — Progress Notes (Deleted)
Cardiology Office Note  Date: 10/10/2018   ID: [name], DOB [birth year], MRN [id number]  PCP: [name], [place]  Primary Cardiologist: Rozann Lesches, MD   No chief complaint on file.   History of Present Illness: [name] is a 48 y.o. female seen in consultation back in October 2019.  At that tme she had a history of atypical chest pain, previous cardiac testing as of 2016 was reassuring with normal coronary arteries at cardiac catheterization.  She was being considered for right foot surgery and follow-up ischemic testing was recommended, exercise Myoview was ordered however the patient did not proceed.  She is referred back to the office after a recent hospital stay with DKA. No cardiac issues were described based on review of the discharge summary.  Past Medical History:  Diagnosis Date  . Benign paroxysmal vertigo   . Cervicalgia   . Depression   . Diabetes ([place])    Type1  . Diabetes mellitus without complication ([employer] [facility])   . GERD (gastroesophageal reflux disease)   . Hiatal hernia   . History of cardiac catheterization    Normal coronary arteries 2016  . History of TIA (transient ischemic attack)   . Hyperlipidemia   . Hypertension   . IBS (irritable bowel syndrome)   . Migraine headache   . Panic disorder with agoraphobia   . Peripheral neuropathy   . Subungual hematoma of digit of hand   . Tendonitis   . Vertigo     Past Surgical History:  Procedure Laterality Date  . ABDOMINAL HYSTERECTOMY    . APPENDECTOMY    . CARDIAC CATHETERIZATION   last in 2009   x 4, normal coronary arteries  . CARDIAC CATHETERIZATION N/A 03/26/2015   Procedure: Left Heart Cath and Coronary Angiography;  Surgeon: Wellington Hampshire, MD;  Location: [place] [facility];  Service: Cardiovascular;  Laterality: N/A;  . CARPAL TUNNEL RELEASE Right 08/24/2018   Procedure: CARPAL TUNNEL RELEASE;  Surgeon: Carole Civil, MD;  Location: [facility];  Service: Orthopedics;   Laterality: Right;  . CESAREAN SECTION    . CHOLECYSTECTOMY    . COLONOSCOPY WITH PROPOFOL N/A 01/09/2018   Procedure: COLONOSCOPY WITH PROPOFOL;  Surgeon: Rogene Houston, MD;  Location: [facility];  Service: Endoscopy;  Laterality: N/A;  . CYST EXCISION     right breast  . ESOPHAGOGASTRODUODENOSCOPY (EGD) WITH PROPOFOL N/A 01/09/2018   Procedure: ESOPHAGOGASTRODUODENOSCOPY (EGD) WITH PROPOFOL;  Surgeon: Rogene Houston, MD;  Location: [facility];  Service: Endoscopy;  Laterality: N/A;  . 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 TUBAL LIGATION      Current Outpatient Medications  Medication Sig Dispense Refill  . aspirin EC 81 MG tablet Take 1 tablet (81 mg total) by mouth daily. 90 tablet 3  . Calcium Polycarbophil (FIBERCON PO) Take 1,000 mg by mouth daily.     . cholecalciferol (VITAMIN D3) 25 MCG (1000 UT) tablet Take 1,000 Units by mouth daily.    . Continuous Blood Gluc Sensor (FREESTYLE LIBRE 14 DAY SENSOR) MISC 1 each by Does not apply route every 14 (fourteen) days. 2 each 2  . diazepam (VALIUM) 10 MG tablet Take 10 mg by mouth every 8 (eight) hours as needed for anxiety.     . dimenhyDRINATE (DRAMAMINE) 50  MG tablet Take 50 mg by mouth every 8 (eight) hours as needed for nausea.     Marland Kitchen docusate sodium (COLACE) 100 MG capsule Take 100-200 mg by mouth See admin instructions. Take 100 mg by mouth in the morning and 200 mg in the evening    . EPIPEN 2-PAK 0.3 MG/0.3ML SOAJ injection Inject 0.3 mg into the muscle once.     Marland Kitchen estradiol (ESTRACE) 1 MG tablet Take 1 mg by mouth daily.    Marland Kitchen GLUCAGEN HYPOKIT 1 MG SOLR injection Inject 1 mg into the skin once as needed for low blood sugar.     . ibuprofen (ADVIL,MOTRIN) 800 MG tablet Take 800 mg by mouth every 8 (eight) hours as needed for  moderate pain.    Marland Kitchen insulin aspart (NOVOLOG) 100 UNIT/ML injection Inject 30-40 Units into the skin daily. Inject 30-40 units via pump daily    . lisinopril (PRINIVIL,ZESTRIL) 5 MG tablet Take 5 mg by mouth daily.  2  . LYRICA 200 MG capsule Take 200 mg by mouth 2 (two) times daily.     . Melatonin 5 MG TABS Take 10 mg by mouth at bedtime as needed (sleep).     . Multiple Vitamin (MULTIVITAMIN WITH MINERALS) TABS tablet Take 1 tablet by mouth daily.    Marland Kitchen omega-3 fish oil (MAXEPA) 1000 MG CAPS capsule Take 1 capsule by mouth daily.     Marland Kitchen oxyCODONE-acetaminophen (PERCOCET) 10-325 MG tablet Take 1 tablet by mouth every 6 (six) hours as needed for pain. 20 tablet 0  . pravastatin (PRAVACHOL) 10 MG tablet Take 10 mg by mouth at bedtime.  2  . tiZANidine (ZANAFLEX) 4 MG tablet Take 4 mg by mouth every 8 (eight) hours as needed for muscle spasms.     . vitamin B-12 (CYANOCOBALAMIN) 1000 MCG tablet Take 2,000 mcg by mouth daily.     No current facility-administered medications for this visit.    Allergies:  Zocor [simvastatin]; Clindamycin/lincomycin; Codeine; Penicillins; and Sulfa antibiotics   Social History: The patient  reports that she has been smoking cigarettes. She has a 5.50 pack-year smoking history. She has never used smokeless tobacco. She reports current alcohol use. She reports that she does not use drugs.   Family History: The patient's family history includes Cancer in her maternal grandfather and sister; Heart attack (age of onset: 12) in her mother; Heart failure (age of onset: 30) in her paternal grandfather; Heart failure (age of onset: 27) in her maternal grandmother; Heart failure (age of onset: 72) in her paternal grandmother; Stroke (age of onset: 61) in her mother.   ROS:  Please see the history of present illness. Otherwise, complete review of systems is positive for {NONE DEFAULTED:18576::"none"}.  All other systems are reviewed and negative.   Physical Exam: VS:  There  were no vitals taken for this visit., BMI There is no height or weight on file to calculate BMI.  Wt Readings from Last 3 Encounters:  09/30/18 160 lb 15 oz (73 kg)  09/27/18 161 lb (73 kg)  09/18/18 161 lb (73 kg)    General: Patient appears comfortable at rest. HEENT: Conjunctiva and lids normal, oropharynx clear with moist mucosa. Neck: Supple, no elevated JVP or carotid bruits, no thyromegaly. Lungs: Clear to auscultation, nonlabored breathing at rest. Cardiac: Regular rate and rhythm, no S3 or significant systolic murmur, no pericardial rub. Abdomen: Soft, nontender, no hepatomegaly, bowel sounds present, no guarding or rebound. Extremities: No pitting edema, distal pulses 2+. Skin:  Warm and dry. Musculoskeletal: No kyphosis. Neuropsychiatric: Alert and oriented x3, affect grossly appropriate.  ECG: I personally reviewed the tracing from 09/29/2018 which showed sinus tachycardia with peaked T waves inferolaterally, R' in lead V1, borderline prolonged PR interval.  Recent Labwork: 01/06/2018: Magnesium 2.0 01/21/2018: ALT 23; AST 23 04/20/2018: TSH 1.63 09/29/2018: Hemoglobin 13.3; Platelets 230 09/30/2018: BUN 25; Creatinine, Ser 0.89; Potassium 3.9; Sodium 139     Component Value Date/Time   CHOL 171 04/20/2018 1657   TRIG 99.0 04/20/2018 1657   HDL 46.60 04/20/2018 1657   CHOLHDL 4 04/20/2018 1657   VLDL 19.8 04/20/2018 1657   LDLCALC 105 (H) 04/20/2018 1657    Other Studies Reviewed Today:  Cardiac catheterization 03/26/2015:  The left ventricular systolic function is normal.   1. Normal coronary arteries in a left dominant system.  2. Normal LV systolic function and left ventricular end-diastolic pressure.  Assessment and Plan:    Current medicines were reviewed with the patient today.  No orders of the defined types were placed in this encounter.   Disposition:  Signed, Satira Sark, MD, [facility] 10/10/2018 11:27 AM    [facility]  [facility] [place], [place], [place] [address] Phone: [phone]; Fax: [phone]

## 2018-10-10 NOTE — Telephone Encounter (Signed)
[name], if she is not getting back on her pump for now, she probably will need a lower dose of Lantus.  Let us decrease the dose to 15 units twice a day and then let us know how she is doing in 2 days.

## 2018-10-10 NOTE — Telephone Encounter (Signed)
Another message left on voice mail to call me concerning blood sugars.

## 2018-10-10 NOTE — Telephone Encounter (Signed)
Pt has f/u with pcp and MRI scheduled as well. Denies any chest pain and says she will call us if pcp recommends her seeing [place]

## 2018-10-10 NOTE — Telephone Encounter (Signed)
opened in errer.

## 2018-10-12 ENCOUNTER — Telehealth: Payer: Self-pay | Admitting: Internal Medicine

## 2018-10-12 NOTE — Telephone Encounter (Signed)
That is excellent news. 

## 2018-10-12 NOTE — Telephone Encounter (Signed)
Patient would like a call back so she can give Dr Cruzita Lederer an update on her blood sugar readings,.

## 2018-10-12 NOTE — Telephone Encounter (Signed)
Spoke to patient, she states her BS numbers are back to her normal levels, no lows and highest was 203.

## 2018-10-15 DIAGNOSIS — E1065 Type 1 diabetes mellitus with hyperglycemia: Secondary | ICD-10-CM | POA: Diagnosis not present

## 2018-10-16 ENCOUNTER — Other Ambulatory Visit: Payer: Self-pay

## 2018-10-16 ENCOUNTER — Ambulatory Visit (HOSPITAL_COMMUNITY)
Admission: RE | Admit: 2018-10-16 | Discharge: 2018-10-16 | Disposition: A | Discharge status: Home / Self Care | Payer: Medicare HMO | Source: Ambulatory Visit | Attending: Neurology | Admitting: Neurology

## 2018-10-16 ENCOUNTER — Other Ambulatory Visit: Payer: Self-pay | Admitting: Neurology

## 2018-10-16 ENCOUNTER — Ambulatory Visit (HOSPITAL_COMMUNITY): Payer: Medicare HMO

## 2018-10-16 ENCOUNTER — Encounter (HOSPITAL_COMMUNITY): Payer: Self-pay

## 2018-10-16 ENCOUNTER — Other Ambulatory Visit (HOSPITAL_COMMUNITY): Payer: Self-pay | Admitting: Neurology

## 2018-10-16 DIAGNOSIS — G8194 Hemiplegia, unspecified affecting left nondominant side: Secondary | ICD-10-CM | POA: Diagnosis not present

## 2018-10-16 DIAGNOSIS — R51 Headache: Secondary | ICD-10-CM | POA: Diagnosis not present

## 2018-10-16 DIAGNOSIS — R531 Weakness: Secondary | ICD-10-CM | POA: Diagnosis not present

## 2018-10-27 DIAGNOSIS — E1065 Type 1 diabetes mellitus with hyperglycemia: Secondary | ICD-10-CM | POA: Diagnosis not present

## 2018-11-07 DIAGNOSIS — I69854 Hemiplegia and hemiparesis following other cerebrovascular disease affecting left non-dominant side: Secondary | ICD-10-CM | POA: Diagnosis not present

## 2018-11-07 DIAGNOSIS — G5603 Carpal tunnel syndrome, bilateral upper limbs: Secondary | ICD-10-CM | POA: Diagnosis not present

## 2018-11-07 DIAGNOSIS — M5412 Radiculopathy, cervical region: Secondary | ICD-10-CM | POA: Diagnosis not present

## 2018-11-07 DIAGNOSIS — M542 Cervicalgia: Secondary | ICD-10-CM | POA: Diagnosis not present

## 2018-11-07 DIAGNOSIS — Z79891 Long term (current) use of opiate analgesic: Secondary | ICD-10-CM | POA: Diagnosis not present

## 2018-11-07 DIAGNOSIS — M545 Low back pain: Secondary | ICD-10-CM | POA: Diagnosis not present

## 2018-11-07 DIAGNOSIS — F349 Persistent mood [affective] disorder, unspecified: Secondary | ICD-10-CM | POA: Diagnosis not present

## 2018-11-15 DIAGNOSIS — E1065 Type 1 diabetes mellitus with hyperglycemia: Secondary | ICD-10-CM | POA: Diagnosis not present

## 2018-11-21 ENCOUNTER — Ambulatory Visit: Payer: Medicare HMO | Admitting: Internal Medicine

## 2018-11-24 DIAGNOSIS — D519 Vitamin B12 deficiency anemia, unspecified: Secondary | ICD-10-CM | POA: Diagnosis not present

## 2018-11-24 DIAGNOSIS — Z79899 Other long term (current) drug therapy: Secondary | ICD-10-CM | POA: Diagnosis not present

## 2018-11-24 DIAGNOSIS — E559 Vitamin D deficiency, unspecified: Secondary | ICD-10-CM | POA: Diagnosis not present

## 2018-11-24 DIAGNOSIS — R5383 Other fatigue: Secondary | ICD-10-CM | POA: Diagnosis not present

## 2018-11-24 DIAGNOSIS — E039 Hypothyroidism, unspecified: Secondary | ICD-10-CM | POA: Diagnosis not present

## 2018-11-24 DIAGNOSIS — R7303 Prediabetes: Secondary | ICD-10-CM | POA: Diagnosis not present

## 2018-11-27 DIAGNOSIS — G5603 Carpal tunnel syndrome, bilateral upper limbs: Secondary | ICD-10-CM | POA: Diagnosis not present

## 2018-11-27 DIAGNOSIS — F349 Persistent mood [affective] disorder, unspecified: Secondary | ICD-10-CM | POA: Diagnosis not present

## 2018-11-27 DIAGNOSIS — I69854 Hemiplegia and hemiparesis following other cerebrovascular disease affecting left non-dominant side: Secondary | ICD-10-CM | POA: Diagnosis not present

## 2018-11-27 DIAGNOSIS — G3184 Mild cognitive impairment, so stated: Secondary | ICD-10-CM | POA: Diagnosis not present

## 2018-12-04 ENCOUNTER — Other Ambulatory Visit: Payer: Self-pay

## 2018-12-04 ENCOUNTER — Ambulatory Visit (HOSPITAL_COMMUNITY)
Admission: RE | Admit: 2018-12-04 | Discharge: 2018-12-04 | Disposition: A | Discharge status: Home / Self Care | Payer: Medicare HMO | Source: Ambulatory Visit | Attending: Neurology | Admitting: Neurology

## 2018-12-04 DIAGNOSIS — G8194 Hemiplegia, unspecified affecting left nondominant side: Secondary | ICD-10-CM | POA: Diagnosis not present

## 2018-12-04 DIAGNOSIS — R42 Dizziness and giddiness: Secondary | ICD-10-CM | POA: Diagnosis not present

## 2018-12-04 DIAGNOSIS — I69352 Hemiplegia and hemiparesis following cerebral infarction affecting left dominant side: Secondary | ICD-10-CM | POA: Diagnosis not present

## 2018-12-11 DIAGNOSIS — Z9641 Presence of insulin pump (external) (internal): Secondary | ICD-10-CM | POA: Diagnosis not present

## 2018-12-11 DIAGNOSIS — F17200 Nicotine dependence, unspecified, uncomplicated: Secondary | ICD-10-CM | POA: Diagnosis not present

## 2018-12-11 DIAGNOSIS — M5134 Other intervertebral disc degeneration, thoracic region: Secondary | ICD-10-CM | POA: Diagnosis not present

## 2018-12-11 DIAGNOSIS — R1013 Epigastric pain: Secondary | ICD-10-CM | POA: Diagnosis not present

## 2018-12-11 DIAGNOSIS — Z79899 Other long term (current) drug therapy: Secondary | ICD-10-CM | POA: Diagnosis not present

## 2018-12-11 DIAGNOSIS — E119 Type 2 diabetes mellitus without complications: Secondary | ICD-10-CM | POA: Diagnosis not present

## 2018-12-11 DIAGNOSIS — M549 Dorsalgia, unspecified: Secondary | ICD-10-CM | POA: Diagnosis not present

## 2018-12-11 DIAGNOSIS — R079 Chest pain, unspecified: Secondary | ICD-10-CM | POA: Diagnosis not present

## 2018-12-11 DIAGNOSIS — R0789 Other chest pain: Secondary | ICD-10-CM | POA: Diagnosis not present

## 2018-12-12 ENCOUNTER — Encounter (HOSPITAL_COMMUNITY): Payer: Self-pay

## 2018-12-12 ENCOUNTER — Ambulatory Visit (HOSPITAL_COMMUNITY): Payer: Medicare HMO | Attending: Neurology | Admitting: Speech Pathology

## 2018-12-14 ENCOUNTER — Telehealth (HOSPITAL_COMMUNITY): Payer: Self-pay | Admitting: Speech Pathology

## 2018-12-14 DIAGNOSIS — Z79891 Long term (current) use of opiate analgesic: Secondary | ICD-10-CM | POA: Diagnosis not present

## 2018-12-14 DIAGNOSIS — G3184 Mild cognitive impairment, so stated: Secondary | ICD-10-CM | POA: Diagnosis not present

## 2018-12-14 DIAGNOSIS — M542 Cervicalgia: Secondary | ICD-10-CM | POA: Diagnosis not present

## 2018-12-14 DIAGNOSIS — M545 Low back pain: Secondary | ICD-10-CM | POA: Diagnosis not present

## 2018-12-14 NOTE — Telephone Encounter (Signed)
Pt was scheduled for 12/12/2018 No Show - Called to r/s mailbox was full could not l/m.

## 2018-12-15 DIAGNOSIS — E1065 Type 1 diabetes mellitus with hyperglycemia: Secondary | ICD-10-CM | POA: Diagnosis not present

## 2018-12-21 ENCOUNTER — Other Ambulatory Visit: Payer: Self-pay

## 2018-12-22 ENCOUNTER — Ambulatory Visit: Payer: Medicare HMO | Admitting: Internal Medicine

## 2018-12-22 ENCOUNTER — Other Ambulatory Visit: Payer: Self-pay

## 2018-12-22 NOTE — Progress Notes (Deleted)
Patient ID: [name], female   DOB: [birth year], 48 y.o.   MRN: [id number]  HPI: [name] is a 48 y.o.-year-old female, returning for f/u for DM1, dx in [birth year] (48 y/o), uncontrolled, with complications (cerebro-vascular ds - h/o TIA 0/2016, PN). Last visit 4 months ago. Insurance: [employer].  In 09/2018 she presented to the hospital with right-sided weakness and was suspected for stroke.  She was in DKA at that time.  MRI ruled out a stroke.  At last visit, sugars are high after her insurance switched her from Humalog to NovoLog.  Last hemoglobin A1c was: Lab Results  Component Value Date   HGBA1C 8.8 (H) 09/29/2018   HGBA1C 8.5 (A) 08/22/2018   HGBA1C 8.9 (H) 08/18/2018   She was previously on an Omni pod, and then Medtronic 670 G insulin pump but she could not afford the Enlite CGM - on freestyle libre CGM.  Previous regimen: - Pump settings:  - basal rates: 12 am: 0.70 units/h >> 0.80 8 am: 0.80 - ICR:   12 am: 9   1 pm: 9 - target: 130-130 - ISF: 45 - Insulin on Board: 4h - bolus wizard: on - bolus wizard: on TDD from basal insulin: 62% (18 units) TDD from bolus insulin: 38% (11 units) - extended bolusing: not using - changes infusion site: q3 days - Meter: Omnipod meter  At this visit, she is on: - Lantus 20 units in am and 16 units at night - Novolog: - 10-12 units before a breakfast - 6-10 units before lunch - 14-16 units before dinner - Sliding scale Novolog:  150-200: + 1 unit 201-250: + 2 units 251-300: + 3 units >301: + 4 units  She checks her sugars 4 times a day with her freestyle libre CGM:  Prev.:   Freestyle Libre CGM parameters: - average: 234+/-68 >> 211 +/- 77.7% - coefficient of variation: 29.1 >> 36.8% (19-25%) - time in range:  - low (<70): 1% >> 2% - normal (70-180): 19% >> 33% - high (>180): 80% >> 65%   Lowest sugar was 27 in 07/2014... 3 (Libre) >> ***;! she has no hypoglycemia awareness! Highest sugar was  601 ...>350 >> ***. No history of hypoglycemia admissions.  Had DKA admission 09/2018.  She also had hyperglycemia ED visits and an admission in 12/2017.  -No CKD, last BUN/creatinine:  Lab Results  Component Value Date   BUN 25 (H) 09/30/2018   CREATININE 0.89 09/30/2018   -+ HL; last set of lipids: Lab Results  Component Value Date   CHOL 171 04/20/2018   HDL 46.60 04/20/2018   LDLCALC 105 (H) 04/20/2018   TRIG 99.0 04/20/2018   CHOLHDL 4 04/20/2018  On pravastatin 10.  Previously on Zocor. - last eye exam was in  2019: No DR - + numbness and tingling in her feet.  She was previously on Neurontin but she stopped due to side effects.  She is currently on Lyrica.  No hypothyroidism: Lab Results  Component Value Date   TSH 1.63 04/20/2018   On Diazepam. Off Wellbutrin because of constipation.  She had tooth surgery in 01/2018, Dr. Britta Mccreedy.  She also had bilateral big toe surgery in 09/2016 with Dr. Amalia Hailey.  ROS: Constitutional: no weight gain/no weight loss, no fatigue, no subjective hyperthermia, no subjective hypothermia Eyes: no blurry vision, no xerophthalmia ENT: no sore throat, no nodules palpated in neck, no dysphagia, no odynophagia, no hoarseness Cardiovascular: no CP/no SOB/no palpitations/no leg swelling Respiratory: no  cough/no SOB/no wheezing Gastrointestinal: no N/no V/no D/no C/no acid reflux Musculoskeletal: no muscle aches/no joint aches Skin: no rashes, no hair loss Neurological: no tremors/+ numbness/+ tingling/no dizziness  I reviewed pt's medications, allergies, PMH, social hx, family hx, and changes were documented in the history of present illness. Otherwise, unchanged from my initial visit note.  Past Medical History:  Diagnosis Date  . Benign paroxysmal vertigo   . Cervicalgia   . Depression   . Diabetes ([place])    Type1  . Diabetes mellitus without complication ([place])   . GERD (gastroesophageal reflux disease)   . Hiatal hernia   . History of  cardiac catheterization    Normal coronary arteries 2016  . History of TIA (transient ischemic attack)   . Hyperlipidemia   . Hypertension   . IBS (irritable bowel syndrome)   . Migraine headache   . Panic disorder with agoraphobia   . Peripheral neuropathy   . Subungual hematoma of digit of hand   . Tendonitis   . Vertigo    Past Surgical History:  Procedure Laterality Date  . ABDOMINAL HYSTERECTOMY    . APPENDECTOMY    . CARDIAC CATHETERIZATION   last in 2009   x 4, normal coronary arteries  . CARDIAC CATHETERIZATION N/A 03/26/2015   Procedure: Left Heart Cath and Coronary Angiography;  Surgeon: Wellington Hampshire, MD;  Location: [facility];  Service: Cardiovascular;  Laterality: N/A;  . CARPAL TUNNEL RELEASE Right 08/24/2018   Procedure: CARPAL TUNNEL RELEASE;  Surgeon: Carole Civil, MD;  Location: [facility];  Service: Orthopedics;  Laterality: Right;  . CESAREAN SECTION    . CHOLECYSTECTOMY    . COLONOSCOPY WITH PROPOFOL N/A 01/09/2018   Procedure: COLONOSCOPY WITH PROPOFOL;  Surgeon: Rogene Houston, MD;  Location: [facility];  Service: Endoscopy;  Laterality: N/A;  . CYST EXCISION     right breast  . ESOPHAGOGASTRODUODENOSCOPY (EGD) WITH PROPOFOL N/A 01/09/2018   Procedure: ESOPHAGOGASTRODUODENOSCOPY (EGD) WITH PROPOFOL;  Surgeon: Rogene Houston, MD;  Location: [facility];  Service: Endoscopy;  Laterality: N/A;  . 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 TUBAL LIGATION     History   Social History  . Marital Status: Divorced    Spouse Name: N/A  . Number of Children: 1   Occupational History  . disabled   Social History Main Topics  . Smoking status: Current Every Day Smoker -- 0.50 packs/day for 11 years  . Smokeless tobacco: Never Used     Comment: patient  is aware that she needs to quit smoking  . Alcohol Use: No  . Drug Use: No   Current Outpatient Medications on File Prior to Visit  Medication Sig Dispense Refill  . aspirin EC 81 MG tablet Take 1 tablet (81 mg total) by mouth daily. 90 tablet 3  . Calcium Polycarbophil (FIBERCON PO) Take 1,000 mg by mouth daily.     . cholecalciferol (VITAMIN D3) 25 MCG (1000 UT) tablet Take 1,000 Units by mouth daily.    . Continuous Blood Gluc Sensor (FREESTYLE LIBRE 14 DAY SENSOR) MISC 1 each by Does not apply route every 14 (fourteen) days. 2 each 2  . diazepam (VALIUM) 10 MG tablet Take 10 mg by mouth every 8 (eight) hours as needed for anxiety.     Marland Kitchen  dimenhyDRINATE (DRAMAMINE) 50 MG tablet Take 50 mg by mouth every 8 (eight) hours as needed for nausea.     Marland Kitchen docusate sodium (COLACE) 100 MG capsule Take 100-200 mg by mouth See admin instructions. Take 100 mg by mouth in the morning and 200 mg in the evening    . EPIPEN 2-PAK 0.3 MG/0.3ML SOAJ injection Inject 0.3 mg into the muscle once.     Marland Kitchen estradiol (ESTRACE) 1 MG tablet Take 1 mg by mouth daily.    Marland Kitchen GLUCAGEN HYPOKIT 1 MG SOLR injection Inject 1 mg into the skin once as needed for low blood sugar.     . ibuprofen (ADVIL,MOTRIN) 800 MG tablet Take 800 mg by mouth every 8 (eight) hours as needed for moderate pain.    Marland Kitchen insulin aspart (NOVOLOG) 100 UNIT/ML injection Inject 30-40 Units into the skin daily. Inject 30-40 units via pump daily    . lisinopril (PRINIVIL,ZESTRIL) 5 MG tablet Take 5 mg by mouth daily.  2  . LYRICA 200 MG capsule Take 200 mg by mouth 2 (two) times daily.     . Melatonin 5 MG TABS Take 10 mg by mouth at bedtime as needed (sleep).     . Multiple Vitamin (MULTIVITAMIN WITH MINERALS) TABS tablet Take 1 tablet by mouth daily.    Marland Kitchen omega-3 fish oil (MAXEPA) 1000 MG CAPS capsule Take 1 capsule by mouth daily.     Marland Kitchen oxyCODONE-acetaminophen (PERCOCET) 10-325 MG tablet Take 1 tablet by mouth every 6 (six) hours as needed for pain. 20  tablet 0  . pravastatin (PRAVACHOL) 10 MG tablet Take 10 mg by mouth at bedtime.  2  . tiZANidine (ZANAFLEX) 4 MG tablet Take 4 mg by mouth every 8 (eight) hours as needed for muscle spasms.     . vitamin B-12 (CYANOCOBALAMIN) 1000 MCG tablet Take 2,000 mcg by mouth daily.     No current facility-administered medications on file prior to visit.    Allergies  Allergen Reactions  . Zocor [Simvastatin] Other (See Comments)    Muscle aches and pains  . Clindamycin/Lincomycin Rash  . Codeine Rash  . Penicillins Rash    Has patient had a PCN reaction causing immediate rash, facial/tongue/throat swelling, SOB or lightheadedness with hypotension: Yes Has patient had a PCN reaction causing severe rash involving mucus membranes or skin necrosis: Yes Has patient had a PCN reaction that required hospitalization: Yes Has patient had a PCN reaction occurring within the last 10 years: No If all of the above answers are "NO", then may proceed with Cephalosporin use.   . Sulfa Antibiotics Rash        Family History  Problem Relation Age of Onset  . Stroke Mother 47  . Heart attack Mother 62  . Cancer Sister        colon  . Heart failure Maternal Grandmother 65  . Cancer Maternal Grandfather        prostate  . Heart failure Paternal Grandmother 65  . Heart failure Paternal Grandfather 60   PE: There were no vitals taken for this visit. There is no height or weight on file to calculate BMI. Wt Readings from Last 3 Encounters:  09/30/18 160 lb 15 oz (73 kg)  09/27/18 161 lb (73 kg)  09/18/18 161 lb (73 kg)   Constitutional: normal weight, in NAD Eyes: PERRLA, EOMI, no exophthalmos ENT: moist mucous membranes, no thyromegaly, no cervical lymphadenopathy Cardiovascular: RRR, No MRG Respiratory: CTA B Gastrointestinal: abdomen soft, NT, ND,  BS+ Musculoskeletal: no deformities, strength intact in all 4 Skin: moist, warm, no rashes Neurological: no tremor with outstretched hands, DTR normal  in all 4  ASSESSMENT: 1. DM1, insulin-dependent, uncontrolled, with complications - cerebro-vascular ds - h/o TIA 0/2016 - PN Sees cardiology >> Dr. Maurice Small - [facility]  PLAN:  1. Patient with longstanding, uncontrolled, type 1 diabetes, previously on an Omni pod insulin pump, then a Medtronic 670 G pump.  She could not use a CGM as this was too expensive.  Since last visit, she presented in DKA in 09/2018.  At that time, she was suspected to have had a stroke, but this was ruled out by an MRI.  She had carotid Dopplers afterwards and these were normal.  However, since then, she was afraid to restart her insulin pump and she is now basal-bolus insulin regimen. -Reviewed latest HbA1c from 3 months ago and this was higher, at 8.8% - I suggested to:  Patient Instructions  Please change: - Lantus 20 units in am and 16 units at night - Novolog: - 10-12 units before a breakfast - 6-10 units before lunch - 14-16 units before dinner - Sliding scale Novolog:  150-200: + 1 unit 201-250: + 2 units 251-300: + 3 units >301: + 4 units  Please return in 3-4 months.  - today, HbA1c is 7%  - continue checking sugars at different times of the day - check 4x a day, rotating checks - advised for yearly eye exams >> she is UTD - Return to clinic in 3-4 mo with sugar log   - time spent with the patient: 25 min, of which >50% was spent in reviewing her sugars and her insulin doses, discussing her hypo- and hyper-glycemic episodes, reviewing previous labs and developing a plan to avoid hypo- and hyper-glycemia.    Philemon Kingdom, MD PhD [facility] Endocrinology

## 2018-12-26 ENCOUNTER — Other Ambulatory Visit: Payer: Self-pay

## 2018-12-26 ENCOUNTER — Encounter: Payer: Self-pay | Admitting: Internal Medicine

## 2018-12-26 ENCOUNTER — Ambulatory Visit (INDEPENDENT_AMBULATORY_CARE_PROVIDER_SITE_OTHER): Payer: Medicare HMO | Admitting: Internal Medicine

## 2018-12-26 VITALS — BP 98/60 | HR 75 | Ht 67.0 in | Wt 153.0 lb

## 2018-12-26 DIAGNOSIS — E1042 Type 1 diabetes mellitus with diabetic polyneuropathy: Secondary | ICD-10-CM | POA: Diagnosis not present

## 2018-12-26 DIAGNOSIS — E1065 Type 1 diabetes mellitus with hyperglycemia: Secondary | ICD-10-CM | POA: Diagnosis not present

## 2018-12-26 DIAGNOSIS — R200 Anesthesia of skin: Secondary | ICD-10-CM | POA: Diagnosis not present

## 2018-12-26 LAB — MICROALBUMIN / CREATININE URINE RATIO
Creatinine,U: 23.5 mg/dL
Microalb Creat Ratio: 3 mg/g (ref 0.0–30.0)
Microalb, Ur: <0.7 mg/dL (ref 0.0–1.9)

## 2018-12-26 LAB — VITAMIN B12: Vitamin B-12: >1500 pg/mL — ABNORMAL HIGH (ref 211–911)

## 2018-12-26 LAB — POCT GLYCOSYLATED HEMOGLOBIN (HGB A1C): Hemoglobin A1C: 8.7 % — AB (ref 4.0–5.6)

## 2018-12-26 LAB — LIPID PANEL
Cholesterol: 156 mg/dL (ref 0–200)
HDL: 44.8 mg/dL (ref >39.00)
LDL Cholesterol: 94 mg/dL (ref 0–99)
NonHDL: 111.47
Total CHOL/HDL Ratio: 3
Triglycerides: 86 mg/dL (ref 0.0–149.0)
VLDL: 17.2 mg/dL (ref 0.0–40.0)

## 2018-12-26 LAB — TSH: TSH: 2.15 u[IU]/mL (ref 0.35–4.50)

## 2018-12-26 LAB — VITAMIN D 25 HYDROXY (VIT D DEFICIENCY, FRACTURES): VITD: 45.84 ng/mL (ref 30.00–100.00)

## 2018-12-26 NOTE — Progress Notes (Signed)
Patient ID: [name], female   DOB: [birth year], 48 y.o.   MRN: [id number]  HPI: [name] is a 48 y.o.-year-old female, returning for f/u for DM1, dx in 19 (48 y/o), uncontrolled, with complications (cerebro-vascular ds - h/o TIA 0/2016, PN). Last visit 4 months ago. Insurance: [employer].  In 09/2018 she presented to the hospital with right-sided weakness and was suspected for stroke (ruled out by MRI)  She was in DKA at that time - pump failed at that time.  Afterwards, she disconnected the pump completely and is now on basal-bolus insulin regimen.  Sugars are higher after her insurance switched her from Humalog to NovoLog.  Last hemoglobin A1c was: Lab Results  Component Value Date   HGBA1C 8.8 (H) 09/29/2018   HGBA1C 8.5 (A) 08/22/2018   HGBA1C 8.9 (H) 08/18/2018   She was previously on an Omni pod, and then Medtronic 670 G insulin pump but she could not afford the Enlite CGM -now freestyle libre CGM.  Previous regimen: - Pump settings:  - basal rates: 12 am: 0.70 units/h >> 0.80 8 am: 0.80 - ICR:   12 am: 9   1 pm: 9 - target: 130-130 - ISF: 45 - Insulin on Board: 4h - bolus wizard: on - bolus wizard: on TDD from basal insulin: 62% (18 units) TDD from bolus insulin: 38% (11 units) - extended bolusing: not using - changes infusion site: q3 days - Meter: Omnipod meter  At this visit, she is on: - Lantus 20 >> 15 units in am and 16 >> 15 units at night - Novolog: -10-12 units before a breakfast -6-10 units before lunch -14-16 units before dinner - Sliding scale Novolog:  150-200: + 1 unit 201-250: + 2 units 251-300: + 3 units >301: + 4 units  She checks her sugars 4 times a day with her freestyle libre CGM:  Prev.:   Freestyle Libre CGM parameters: - average: 234+/-68 >> 211 +/- 77.7% >> 209 - Glucose variability: 39.8% (ideal less than 36%) - Percentage time CGM active: 98% - time in range:  - very low (<54): 0% - low (54-69): 3% -  normal range (70-180): 37% - high sugars (181-250): 26% - very high sugars (>250): 34%   Lowest sugar was 27 in 07/2014... 48 (Libre) >> 50s;!  She does not have hypoglycemia awareness! Highest sugar was 601 ...>350 >> 1200 (!!!) - at last hospitalization . No history of hypoglycemia admissions.  Had DKA admission 09/2018.  She also had hyperglycemia ED visits and an admission in 12/2017.  -No CKD, last BUN/creatinine:  Lab Results  Component Value Date   BUN 25 (H) 09/30/2018   CREATININE 0.89 09/30/2018   -+ HL; last set of lipids: Lab Results  Component Value Date   CHOL 171 04/20/2018   HDL 46.60 04/20/2018   LDLCALC 105 (H) 04/20/2018   TRIG 99.0 04/20/2018   CHOLHDL 4 04/20/2018  On pravastatin 10, previously on Zocor - last eye exam was in 2019: No DR - + numbness and tingling in her feet.  She was previously on Neurontin but she stopped due to side effects.  She is currently on Lyrica.  No history of hypothyroidism: Lab Results  Component Value Date   TSH 1.63 04/20/2018   On Diazepam.  Off Wellbutrin because of constipation  She had tooth surgery in 01/2018, Dr. Britta Mccreedy.  She also had bilateral big toe surgery in 09/2016 with Dr. Amalia Hailey.  ROS: Constitutional: no weight gain/no weight  loss, no fatigue, no subjective hyperthermia, no subjective hypothermia Eyes: no blurry vision, no xerophthalmia ENT: no sore throat, no nodules palpated in neck, no dysphagia, no odynophagia, no hoarseness Cardiovascular: no CP/no SOB/no palpitations/no leg swelling Respiratory: no cough/no SOB/no wheezing Gastrointestinal: no N/no V/no D/no C/no acid reflux Musculoskeletal: no muscle aches/no joint aches Skin: no rashes, no hair loss Neurological: no tremors/+ numbness/+ tingling/no dizziness  I reviewed pt's medications, allergies, PMH, social hx, family hx, and changes were documented in the history of present illness. Otherwise, unchanged from my initial visit note.  Past  Medical History:  Diagnosis Date  . Benign paroxysmal vertigo   . Cervicalgia   . Depression   . Diabetes ([place])    Type1  . Diabetes mellitus without complication ([place])   . GERD (gastroesophageal reflux disease)   . Hiatal hernia   . History of cardiac catheterization    Normal coronary arteries 2016  . History of TIA (transient ischemic attack)   . Hyperlipidemia   . Hypertension   . IBS (irritable bowel syndrome)   . Migraine headache   . Panic disorder with agoraphobia   . Peripheral neuropathy   . Subungual hematoma of digit of hand   . Tendonitis   . Vertigo    Past Surgical History:  Procedure Laterality Date  . ABDOMINAL HYSTERECTOMY    . APPENDECTOMY    . CARDIAC CATHETERIZATION   last in 2009   x 4, normal coronary arteries  . CARDIAC CATHETERIZATION N/A 03/26/2015   Procedure: Left Heart Cath and Coronary Angiography;  Surgeon: Wellington Hampshire, MD;  Location: [facility];  Service: Cardiovascular;  Laterality: N/A;  . CARPAL TUNNEL RELEASE Right 08/24/2018   Procedure: CARPAL TUNNEL RELEASE;  Surgeon: Carole Civil, MD;  Location: [facility];  Service: Orthopedics;  Laterality: Right;  . CESAREAN SECTION    . CHOLECYSTECTOMY    . COLONOSCOPY WITH PROPOFOL N/A 01/09/2018   Procedure: COLONOSCOPY WITH PROPOFOL;  Surgeon: Rogene Houston, MD;  Location: [facility];  Service: Endoscopy;  Laterality: N/A;  . CYST EXCISION     right breast  . ESOPHAGOGASTRODUODENOSCOPY (EGD) WITH PROPOFOL N/A 01/09/2018   Procedure: ESOPHAGOGASTRODUODENOSCOPY (EGD) WITH PROPOFOL;  Surgeon: Rogene Houston, MD;  Location: [facility];  Service: Endoscopy;  Laterality: N/A;  . 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 TUBAL LIGATION     History   Social History  . Marital Status:  Divorced    Spouse Name: N/A  . Number of Children: 1   Occupational History  . disabled   Social History Main Topics  . Smoking status: Current Every Day Smoker -- 0.50 packs/day for 11 years  . Smokeless tobacco: Never Used     Comment: patient is aware that she needs to quit smoking  . Alcohol Use: No  . Drug Use: No   Current Outpatient Medications on File Prior to Visit  Medication Sig Dispense Refill  . aspirin EC 81 MG tablet Take 1 tablet (81 mg total) by mouth daily. 90 tablet 3  . Calcium Polycarbophil (FIBERCON PO) Take 1,000 mg by mouth daily.     . cholecalciferol (VITAMIN D3) 25 MCG (1000 UT) tablet Take 1,000 Units by mouth daily.    . Continuous Blood Gluc Sensor (FREESTYLE LIBRE 14  DAY SENSOR) MISC 1 each by Does not apply route every 14 (fourteen) days. 2 each 2  . diazepam (VALIUM) 10 MG tablet Take 10 mg by mouth every 8 (eight) hours as needed for anxiety.     . dimenhyDRINATE (DRAMAMINE) 50 MG tablet Take 50 mg by mouth every 8 (eight) hours as needed for nausea.     Marland Kitchen docusate sodium (COLACE) 100 MG capsule Take 100-200 mg by mouth See admin instructions. Take 100 mg by mouth in the morning and 200 mg in the evening    . EPIPEN 2-PAK 0.3 MG/0.3ML SOAJ injection Inject 0.3 mg into the muscle once.     Marland Kitchen estradiol (ESTRACE) 1 MG tablet Take 1 mg by mouth daily.    Marland Kitchen GLUCAGEN HYPOKIT 1 MG SOLR injection Inject 1 mg into the skin once as needed for low blood sugar.     . ibuprofen (ADVIL,MOTRIN) 800 MG tablet Take 800 mg by mouth every 8 (eight) hours as needed for moderate pain.    Marland Kitchen insulin aspart (NOVOLOG) 100 UNIT/ML injection Inject 30-40 Units into the skin daily. Inject 30-40 units via pump daily    . lisinopril (PRINIVIL,ZESTRIL) 5 MG tablet Take 5 mg by mouth daily.  2  . LYRICA 200 MG capsule Take 200 mg by mouth 2 (two) times daily.     . Melatonin 5 MG TABS Take 10 mg by mouth at bedtime as needed (sleep).     . Multiple Vitamin (MULTIVITAMIN WITH  MINERALS) TABS tablet Take 1 tablet by mouth daily.    Marland Kitchen omega-3 fish oil (MAXEPA) 1000 MG CAPS capsule Take 1 capsule by mouth daily.     Marland Kitchen oxyCODONE-acetaminophen (PERCOCET) 10-325 MG tablet Take 1 tablet by mouth every 6 (six) hours as needed for pain. 20 tablet 0  . pravastatin (PRAVACHOL) 10 MG tablet Take 10 mg by mouth at bedtime.  2  . tiZANidine (ZANAFLEX) 4 MG tablet Take 4 mg by mouth every 8 (eight) hours as needed for muscle spasms.     . vitamin B-12 (CYANOCOBALAMIN) 1000 MCG tablet Take 2,000 mcg by mouth daily.     No current facility-administered medications on file prior to visit.    Allergies  Allergen Reactions  . Zocor [Simvastatin] Other (See Comments)    Muscle aches and pains  . Clindamycin/Lincomycin Rash  . Codeine Rash  . Penicillins Rash    Has patient had a PCN reaction causing immediate rash, facial/tongue/throat swelling, SOB or lightheadedness with hypotension: Yes Has patient had a PCN reaction causing severe rash involving mucus membranes or skin necrosis: Yes Has patient had a PCN reaction that required hospitalization: Yes Has patient had a PCN reaction occurring within the last 10 years: No If all of the above answers are "NO", then may proceed with Cephalosporin use.   . Sulfa Antibiotics Rash        Family History  Problem Relation Age of Onset  . Stroke Mother 74  . Heart attack Mother 63  . Cancer Sister        colon  . Heart failure Maternal Grandmother 65  . Cancer Maternal Grandfather        prostate  . Heart failure Paternal Grandmother 8  . Heart failure Paternal Grandfather 60   PE: There were no vitals taken for this visit. There is no height or weight on file to calculate BMI. Wt Readings from Last 3 Encounters:  09/30/18 160 lb 15 oz (73 kg)  09/27/18 161  lb (73 kg)  09/18/18 161 lb (73 kg)   Constitutional: normal weight, in NAD Eyes: PERRLA, EOMI, no exophthalmos ENT: moist mucous membranes, no thyromegaly, no  cervical lymphadenopathy Cardiovascular: RRR, No MRG Respiratory: CTA B Gastrointestinal: abdomen soft, NT, ND, BS+ Musculoskeletal: no deformities, strength intact in all 4 Skin: moist, warm, no rashes Neurological: no tremor with outstretched hands, DTR normal in all 4  ASSESSMENT: 1. DM1, insulin-dependent, uncontrolled, with complications - cerebro-vascular ds - h/o TIA 0/2016 - PN Sees cardiology >> Dr. Maurice Small - [facility]  2.  Numbness in fingers  PLAN:  1. Patient with longstanding, uncontrolled, type 1 diabetes, previously on an Omni pod insulin pump, then the Medtronic 670 G insulin pump.  She could not use a CGM as discussed with expensive.  Since last visit, she presented in DKA in 09/2018.  At that time, she was suspected to have a stroke, but this was ruled out by an MRI.  She had carotid Dopplers afterwards and these were normal.  However, since then, she was afraid to restart her insulin pump and she is now on a basal-bolus insulin regimen. -Reviewed her HbA1c from 3 months ago and this was higher, at 8.8%. -At this visit, she continues on a basal-bolus insulin regimen, with the following patterns: High blood sugars after lunch (started increasing around 3 PM), which remain elevated until 3 AM, after which they start to decrease.  She may have lows in the 50s before lunch.  I suspect that these are due to too much insulin with breakfast so we will reduce this.  However, the insulin with lunch is not enough so we will increase the dose of NovoLog with this meal.  I see the dose of insulin with dinner insufficient.  Since sugars are high in the second half of the day, will increase morning Lantus, also. - I suggested to:  Patient Instructions  Please change: - Lantus 15 >> 20 units in am and 15 at night - Novolog: -10-12 >> 6-8 units before a breakfast -6-10 >> 10-14 units before lunch -14-16 units before dinner - Sliding scale Novolog:  150-200: + 1 unit 201-250: +  2 units 251-300: + 3 units >301: + 4 units  Please return in 3-4 months with your sugar log.   - today, HbA1c is 8.7% (slightly lower) - continue checking sugars at different times of the day - check 1x a day, rotating checks - advised for yearly eye exams >> she is UTD - Return to clinic in 3-4 mo with sugar log    2.  Numbness in fingers - L hand -Developed after her DKA episode -New problem for me -While this could be due to neuropathy related to her diabetes, I would like to check a B12 level and a TSH today.  We will also add a vitamin D level and her annual diabetes labs.  Office Visit on 12/26/2018  Component Date Value Ref Range Status  . Vitamin B-12 12/26/2018 >1500* 211 - 911 pg/mL Final  . TSH 12/26/2018 2.15  0.35 - 4.50 uIU/mL Final  . VITD 12/26/2018 45.84  30.00 - 100.00 ng/mL Final  . Glucose, Bld 12/26/2018 68  65 - 99 mg/dL Final   Comment: .            Fasting reference interval .   . BUN 12/26/2018 7  7 - 25 mg/dL Final  . Creat 12/26/2018 0.75  0.50 - 1.10 mg/dL Final  . GFR, Est Non African  American 12/26/2018 94  > OR = 60 mL/min/1.14m2 Final  . GFR, Est African American 12/26/2018 109  > OR = 60 mL/min/1.66m2 Final  . BUN/Creatinine Ratio 40/05/2724 NOT APPLICABLE  6 - 22 (calc) Final  . Sodium 12/26/2018 142  135 - 146 mmol/L Final  . Potassium 12/26/2018 4.2  3.5 - 5.3 mmol/L Final  . Chloride 12/26/2018 105  98 - 110 mmol/L Final  . CO2 12/26/2018 32  20 - 32 mmol/L Final  . Calcium 12/26/2018 10.1  8.6 - 10.2 mg/dL Final  . Total Protein 12/26/2018 6.5  6.1 - 8.1 g/dL Final  . Albumin 12/26/2018 4.3  3.6 - 5.1 g/dL Final  . Globulin 12/26/2018 2.2  1.9 - 3.7 g/dL (calc) Final  . AG Ratio 12/26/2018 2.0  1.0 - 2.5 (calc) Final  . Total Bilirubin 12/26/2018 0.3  0.2 - 1.2 mg/dL Final  . Alkaline phosphatase (APISO) 12/26/2018 59  31 - 125 U/L Final  . AST 12/26/2018 11  10 - 35 U/L Final  . ALT 12/26/2018 7  6 - 29 U/L Final  . Microalb, Ur  12/26/2018 <0.7  0.0 - 1.9 mg/dL Final  . Creatinine,U 12/26/2018 23.5  mg/dL Final  . Microalb Creat Ratio 12/26/2018 3.0  0.0 - 30.0 mg/g Final  . Cholesterol 12/26/2018 156  0 - 200 mg/dL Final   ATP III Classification       Desirable:  < 200 mg/dL               Borderline High:  200 - 239 mg/dL          High:  > = 240 mg/dL  . Triglycerides 12/26/2018 86.0  0.0 - 149.0 mg/dL Final   Normal:  <150 mg/dLBorderline High:  150 - 199 mg/dL  . HDL 12/26/2018 44.80  >39.00 mg/dL Final  . VLDL 12/26/2018 17.2  0.0 - 40.0 mg/dL Final  . LDL Cholesterol 12/26/2018 94  0 - 99 mg/dL Final  . Total CHOL/HDL Ratio 12/26/2018 3   Final                  Men          Women1/2 Average Risk     3.4          3.3Average Risk          5.0          4.42X Average Risk          9.6          7.13X Average Risk          15.0          11.0                      . NonHDL 12/26/2018 111.47   Final   NOTE:  Non-HDL goal should be 30 mg/dL higher than patient's LDL goal (i.e. LDL goal of < 70 mg/dL, would have non-HDL goal of < 100 mg/dL)  . Hemoglobin A1C 12/26/2018 8.7* 4.0 - 5.6 % Final   Labs are at goal except high HbA1c and also B12. Will check with pt if she is taking the  B12 supplement listed on her med list. If so, we can back off the dose.  Philemon Kingdom, MD PhD [facility] Endocrinology

## 2018-12-26 NOTE — Patient Instructions (Signed)
Please change: - Lantus 15 >> 20 units in am and 15 at night - Novolog: -10-12 >> 6-8 units before a breakfast -6-10 >> 10-14 units before lunch -14-16 units before dinner - Sliding scale Novolog:  150-200: + 1 unit 201-250: + 2 units 251-300: + 3 units >301: + 4 units  Please return in 3-4 months with your sugar log.

## 2018-12-27 LAB — COMPLETE METABOLIC PANEL WITH GFR
AG Ratio: 2 (calc) (ref 1.0–2.5)
ALT: 7 U/L (ref 6–29)
AST: 11 U/L (ref 10–35)
Albumin: 4.3 g/dL (ref 3.6–5.1)
Alkaline phosphatase (APISO): 59 U/L (ref 31–125)
BUN: 7 mg/dL (ref 7–25)
CO2: 32 mmol/L (ref 20–32)
Calcium: 10.1 mg/dL (ref 8.6–10.2)
Chloride: 105 mmol/L (ref 98–110)
Creat: 0.75 mg/dL (ref 0.50–1.10)
GFR, Est African American: 109 mL/min/{1.73_m2} (ref >=60)
GFR, Est Non African American: 94 mL/min/{1.73_m2} (ref >=60)
Globulin: 2.2 g/dL (calc) (ref 1.9–3.7)
Glucose, Bld: 68 mg/dL (ref 65–99)
Potassium: 4.2 mmol/L (ref 3.5–5.3)
Sodium: 142 mmol/L (ref 135–146)
Total Bilirubin: 0.3 mg/dL (ref 0.2–1.2)
Total Protein: 6.5 g/dL (ref 6.1–8.1)

## 2019-01-03 ENCOUNTER — Telehealth (HOSPITAL_COMMUNITY): Payer: Self-pay | Admitting: Speech Pathology

## 2019-01-03 NOTE — Telephone Encounter (Signed)
L/m requesting return phone call to cx or schedule this referral. If we don't hear back from pt by 01/17/2019 referral will be closed.

## 2019-01-15 DIAGNOSIS — E1065 Type 1 diabetes mellitus with hyperglycemia: Secondary | ICD-10-CM | POA: Diagnosis not present

## 2019-01-22 ENCOUNTER — Telehealth: Payer: Self-pay | Admitting: Internal Medicine

## 2019-01-22 NOTE — Telephone Encounter (Signed)
Patient would the results from her most recent labs.  Call back number is [phone]

## 2019-01-23 NOTE — Telephone Encounter (Signed)
SECOND ATTEMPT:  Called pt to make her aware. LVM requesting returned call.

## 2019-01-23 NOTE — Telephone Encounter (Signed)
LVM requesting returned call 

## 2019-01-23 NOTE — Telephone Encounter (Signed)
Good, except for a1c, which you and Dr Cruzita Lederer already addressed.

## 2019-01-23 NOTE — Telephone Encounter (Signed)
Please review and advise.

## 2019-01-24 ENCOUNTER — Other Ambulatory Visit: Payer: Self-pay | Admitting: Internal Medicine

## 2019-01-24 ENCOUNTER — Other Ambulatory Visit: Payer: Self-pay

## 2019-01-24 DIAGNOSIS — Z20822 Contact with and (suspected) exposure to covid-19: Secondary | ICD-10-CM

## 2019-01-24 DIAGNOSIS — G3184 Mild cognitive impairment, so stated: Secondary | ICD-10-CM | POA: Diagnosis not present

## 2019-01-24 DIAGNOSIS — R6889 Other general symptoms and signs: Secondary | ICD-10-CM | POA: Diagnosis not present

## 2019-01-24 DIAGNOSIS — I69854 Hemiplegia and hemiparesis following other cerebrovascular disease affecting left non-dominant side: Secondary | ICD-10-CM | POA: Diagnosis not present

## 2019-01-24 DIAGNOSIS — F349 Persistent mood [affective] disorder, unspecified: Secondary | ICD-10-CM | POA: Diagnosis not present

## 2019-01-24 DIAGNOSIS — G5603 Carpal tunnel syndrome, bilateral upper limbs: Secondary | ICD-10-CM | POA: Diagnosis not present

## 2019-01-26 DIAGNOSIS — E1065 Type 1 diabetes mellitus with hyperglycemia: Secondary | ICD-10-CM | POA: Diagnosis not present

## 2019-01-28 LAB — NOVEL CORONAVIRUS, NAA: SARS-CoV-2, NAA: NOT DETECTED

## 2019-01-31 ENCOUNTER — Telehealth: Payer: Self-pay | Admitting: Hematology

## 2019-01-31 NOTE — Telephone Encounter (Signed)
Pt is aware covid 19 test is negative °

## 2019-02-12 DIAGNOSIS — G9341 Metabolic encephalopathy: Secondary | ICD-10-CM | POA: Diagnosis not present

## 2019-02-12 DIAGNOSIS — I69854 Hemiplegia and hemiparesis following other cerebrovascular disease affecting left non-dominant side: Secondary | ICD-10-CM | POA: Diagnosis not present

## 2019-02-12 DIAGNOSIS — F349 Persistent mood [affective] disorder, unspecified: Secondary | ICD-10-CM | POA: Diagnosis not present

## 2019-02-12 DIAGNOSIS — G5603 Carpal tunnel syndrome, bilateral upper limbs: Secondary | ICD-10-CM | POA: Diagnosis not present

## 2019-02-14 DIAGNOSIS — E1065 Type 1 diabetes mellitus with hyperglycemia: Secondary | ICD-10-CM | POA: Diagnosis not present

## 2019-02-15 DIAGNOSIS — H524 Presbyopia: Secondary | ICD-10-CM | POA: Diagnosis not present

## 2019-02-19 ENCOUNTER — Ambulatory Visit (INDEPENDENT_AMBULATORY_CARE_PROVIDER_SITE_OTHER): Payer: Medicare HMO | Admitting: Internal Medicine

## 2019-02-19 DIAGNOSIS — R5383 Other fatigue: Secondary | ICD-10-CM | POA: Diagnosis not present

## 2019-02-19 DIAGNOSIS — Z6825 Body mass index (BMI) 25.0-25.9, adult: Secondary | ICD-10-CM | POA: Diagnosis not present

## 2019-02-19 DIAGNOSIS — E1065 Type 1 diabetes mellitus with hyperglycemia: Secondary | ICD-10-CM | POA: Diagnosis not present

## 2019-02-19 DIAGNOSIS — F329 Major depressive disorder, single episode, unspecified: Secondary | ICD-10-CM | POA: Diagnosis not present

## 2019-02-19 DIAGNOSIS — D519 Vitamin B12 deficiency anemia, unspecified: Secondary | ICD-10-CM | POA: Diagnosis not present

## 2019-02-19 DIAGNOSIS — M7918 Myalgia, other site: Secondary | ICD-10-CM | POA: Diagnosis not present

## 2019-02-19 DIAGNOSIS — R413 Other amnesia: Secondary | ICD-10-CM | POA: Diagnosis not present

## 2019-02-20 DIAGNOSIS — G9341 Metabolic encephalopathy: Secondary | ICD-10-CM | POA: Diagnosis not present

## 2019-02-20 DIAGNOSIS — F349 Persistent mood [affective] disorder, unspecified: Secondary | ICD-10-CM | POA: Diagnosis not present

## 2019-02-20 DIAGNOSIS — I69854 Hemiplegia and hemiparesis following other cerebrovascular disease affecting left non-dominant side: Secondary | ICD-10-CM | POA: Diagnosis not present

## 2019-02-20 DIAGNOSIS — G5603 Carpal tunnel syndrome, bilateral upper limbs: Secondary | ICD-10-CM | POA: Diagnosis not present

## 2019-02-21 DIAGNOSIS — Z87828 Personal history of other (healed) physical injury and trauma: Secondary | ICD-10-CM | POA: Diagnosis not present

## 2019-02-21 DIAGNOSIS — K6289 Other specified diseases of anus and rectum: Secondary | ICD-10-CM | POA: Diagnosis not present

## 2019-02-21 DIAGNOSIS — M533 Sacrococcygeal disorders, not elsewhere classified: Secondary | ICD-10-CM | POA: Diagnosis not present

## 2019-02-23 ENCOUNTER — Ambulatory Visit: Payer: Medicare HMO | Admitting: Internal Medicine

## 2019-02-23 NOTE — Progress Notes (Signed)
NO SHOW  Patient ID: [name], female   DOB: [birth year], 48 y.o.   MRN: [id number]  HPI: [name] is a 48 y.o.-year-old female-year-old female, returning for f/u for DM1, dx in 72 (48 y/o), uncontrolled, with complications (cerebro-vascular ds - h/o TIA 0/2016, PN). Last visit 2 months ago.  She is scheduled this appointment urgently for high blood sugars. Insurance: [employer].  In 09/2018 she presented to the hospital with right-sided weakness and was suspected for stroke (ruled out by MRI)  She was in DKA at that time - pump failed at that time.  Afterwards, she disconnected the pump completely and is now on basal-bolus insulin regimen.  At last visit sugars were higher after her insurance switched her from Humalog to NovoLog.  Reviewed latest HbA1c levels: Lab Results  Component Value Date   HGBA1C 8.7 (A) 12/26/2018   HGBA1C 8.8 (H) 09/29/2018   HGBA1C 8.5 (A) 08/22/2018   She was previously on an Omni pod, and then Medtronic 670 G insulin pump but she could not afford the Enlite CGM -no freestyle libre CGM.  Previous regimen: - Pump settings:  - basal rates: 12 am: 0.70 units/h >> 0.80 8 am: 0.80 - ICR:   12 am: 9   1 pm: 9 - target: 130-130 - ISF: 45 - Insulin on Board: 4h - bolus wizard: on - bolus wizard: on TDD from basal insulin: 62% (18 units) TDD from bolus insulin: 38% (11 units) - extended bolusing: not using - changes infusion site: q3 days - Meter: Omnipod meter  She is on: - Lantus 15 >> 20 units in am and 15 at night - Novolog: -10-12 >> 6-8 units before a breakfast -6-10 >> 10-14 units before lunch -14-16 units before dinner - Sliding scale Novolog:  150-200: + 1 unit 201-250: + 2 units 251-300: + 3 units >301: + 4 units  She checks her sugars more than 4 times a day with her freestyle libre CGM:   Prev:   Freestyle Libre CGM parameters: - average: 234+/-68 >> 211 +/- 77.7% >> 209 - Glucose variability: 39.8% (ideal less than  36%) - Percentage time CGM active: 98% - time in range:  - very low (<54): 0% - low (54-69): 3% - normal range (70-180): 37% - high sugars (181-250): 26% - very high sugars (>250): 34%   Lowest sugar was 27 in 07/2014... 48 (Libre) >> 50s >> **;!  She does not have hypoglycemia awareness! Highest sugar was 601 ...>350 >> 1200 (!!!) -At last hospitalization >> **. No history of hypoglycemia admissions.  Last DKA admission 09/2018.  She also had hyperglycemia ED visits and an admission in 12/2017.  -No CKD, last BUN/creatinine:  Lab Results  Component Value Date   BUN 7 12/26/2018   CREATININE 0.75 12/26/2018   -+ HL; last set of lipids: Lab Results  Component Value Date   CHOL 156 12/26/2018   HDL 44.80 12/26/2018   LDLCALC 94 12/26/2018   TRIG 86.0 12/26/2018   CHOLHDL 3 12/26/2018  On pravastatin 10. - last eye exam was in 2019: No DR - + numbness and tingling in her feet.  She was previously on Neurontin but she stopped due to side effects.  She is currently on Lyrica.  No history of hypothyroidism: Lab Results  Component Value Date   TSH 2.15 12/26/2018   On Diazepam.  Off Wellbutrin due to constipation.  She had tooth surgery in 01/2018, Dr. Britta Mccreedy.  She also had  bilateral big toe surgery in 09/2016 with Dr. Amalia Hailey.  ROS: Constitutional: no weight gain/no weight loss, no fatigue, no subjective hyperthermia, no subjective hypothermia Eyes: no blurry vision, no xerophthalmia ENT: no sore throat, no nodules palpated in neck, no dysphagia, no odynophagia, no hoarseness Cardiovascular: no CP/no SOB/no palpitations/no leg swelling Respiratory: no cough/no SOB/no wheezing Gastrointestinal: no N/no V/no D/no C/no acid reflux Musculoskeletal: no muscle aches/no joint aches Skin: no rashes, no hair loss Neurological: no tremors/+ numbness/+ tingling/no dizziness  I reviewed pt's medications, allergies, PMH, social hx, family hx, and changes were documented in the history  of present illness. Otherwise, unchanged from my initial visit note.  Past Medical History:  Diagnosis Date  . Benign paroxysmal vertigo   . Cervicalgia   . Depression   . Diabetes ([place])    Type1  . Diabetes mellitus without complication ([place])   . GERD (gastroesophageal reflux disease)   . Hiatal hernia   . History of cardiac catheterization    Normal coronary arteries 2016  . History of TIA (transient ischemic attack)   . Hyperlipidemia   . Hypertension   . IBS (irritable bowel syndrome)   . Migraine headache   . Panic disorder with agoraphobia   . Peripheral neuropathy   . Subungual hematoma of digit of hand   . Tendonitis   . Vertigo    Past Surgical History:  Procedure Laterality Date  . ABDOMINAL HYSTERECTOMY    . APPENDECTOMY    . CARDIAC CATHETERIZATION   last in 2009   x 4, normal coronary arteries  . CARDIAC CATHETERIZATION N/A 03/26/2015   Procedure: Left Heart Cath and Coronary Angiography;  Surgeon: Wellington Hampshire, MD;  Location: [facility];  Service: Cardiovascular;  Laterality: N/A;  . CARPAL TUNNEL RELEASE Right 08/24/2018   Procedure: CARPAL TUNNEL RELEASE;  Surgeon: Carole Civil, MD;  Location: [facility];  Service: Orthopedics;  Laterality: Right;  . CESAREAN SECTION    . CHOLECYSTECTOMY    . COLONOSCOPY WITH PROPOFOL N/A 01/09/2018   Procedure: COLONOSCOPY WITH PROPOFOL;  Surgeon: Rogene Houston, MD;  [facility];  Service: Endoscopy;  Laterality: N/A;  . CYST EXCISION     right breast  . ESOPHAGOGASTRODUODENOSCOPY (EGD) WITH PROPOFOL N/A 01/09/2018   Procedure: ESOPHAGOGASTRODUODENOSCOPY (EGD) WITH PROPOFOL;  Surgeon: Rogene Houston, MD;  [facility];  Service: Endoscopy;  Laterality: N/A;  . 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 TUBAL LIGATION     History   Social History  . Marital Status: Divorced    Spouse Name: N/A  . Number of Children: 1   Occupational History  . disabled   Social History Main Topics  . Smoking status: Current Every Day Smoker -- 0.50 packs/day for 11 years  . Smokeless tobacco: Never Used     Comment: patient is aware that she needs to quit smoking  . Alcohol Use: No  . Drug Use: No   Current Outpatient Medications on File Prior to Visit  Medication Sig Dispense Refill  . aspirin EC 81 MG tablet Take 1 tablet (81 mg total) by mouth daily. 90 tablet 3  . Calcium Polycarbophil (FIBERCON PO) Take 1,000 mg by mouth daily.     . cholecalciferol (VITAMIN D3) 25 MCG (1000 UT) tablet Take  1,000 Units by mouth daily.    . Continuous Blood Gluc Sensor (FREESTYLE LIBRE 14 DAY SENSOR) MISC 1 each by Does not apply route every 14 (fourteen) days. 2 each 2  . diazepam (VALIUM) 10 MG tablet Take 10 mg by mouth every 8 (eight) hours as needed for anxiety.     . dimenhyDRINATE (DRAMAMINE) 50 MG tablet Take 50 mg by mouth every 8 (eight) hours as needed for nausea.     Marland Kitchen docusate sodium (COLACE) 100 MG capsule Take 100-200 mg by mouth See admin instructions. Take 100 mg by mouth in the morning and 200 mg in the evening    . EPIPEN 2-PAK 0.3 MG/0.3ML SOAJ injection Inject 0.3 mg into the muscle once.     Marland Kitchen estradiol (ESTRACE) 1 MG tablet Take 1 mg by mouth daily.    Marland Kitchen GLUCAGEN HYPOKIT 1 MG SOLR injection Inject 1 mg into the skin once as needed for low blood sugar.     . ibuprofen (ADVIL,MOTRIN) 800 MG tablet Take 800 mg by mouth every 8 (eight) hours as needed for moderate pain.    Marland Kitchen insulin aspart (NOVOLOG) 100 UNIT/ML injection Inject 30-40 Units into the skin daily. Inject 30-40 units via pump daily    . lisinopril (PRINIVIL,ZESTRIL) 5 MG tablet Take 5 mg by mouth daily.  2  . LYRICA 200 MG capsule Take 200 mg by mouth 2 (two) times daily.     . Melatonin 5 MG TABS Take 10 mg by  mouth at bedtime as needed (sleep).     . Multiple Vitamin (MULTIVITAMIN WITH MINERALS) TABS tablet Take 1 tablet by mouth daily.    Marland Kitchen omega-3 fish oil (MAXEPA) 1000 MG CAPS capsule Take 1 capsule by mouth daily.     Marland Kitchen oxyCODONE-acetaminophen (PERCOCET) 10-325 MG tablet Take 1 tablet by mouth every 6 (six) hours as needed for pain. 20 tablet 0  . pravastatin (PRAVACHOL) 10 MG tablet Take 10 mg by mouth at bedtime.  2  . tiZANidine (ZANAFLEX) 4 MG tablet Take 4 mg by mouth every 8 (eight) hours as needed for muscle spasms.     . vitamin B-12 (CYANOCOBALAMIN) 1000 MCG tablet Take 2,000 mcg by mouth daily.     No current facility-administered medications on file prior to visit.    Allergies  Allergen Reactions  . Zocor [Simvastatin] Other (See Comments)    Muscle aches and pains  . Clindamycin/Lincomycin Rash  . Codeine Rash  . Penicillins Rash    Has patient had a PCN reaction causing immediate rash, facial/tongue/throat swelling, SOB or lightheadedness with hypotension: Yes Has patient had a PCN reaction causing severe rash involving mucus membranes or skin necrosis: Yes Has patient had a PCN reaction that required hospitalization: Yes Has patient had a PCN reaction occurring within the last 10 years: No If all of the above answers are "NO", then may proceed with Cephalosporin use.   . Sulfa Antibiotics Rash        Family History  Problem Relation Age of Onset  . Stroke Mother 55  . Heart attack Mother 31  . Cancer Sister        colon  . Heart failure Maternal Grandmother 65  . Cancer Maternal Grandfather        prostate  . Heart failure Paternal Grandmother 29  . Heart failure Paternal Grandfather 60   PE: There were no vitals taken for this visit. There is no height or weight on file to calculate BMI. Wt  Readings from Last 3 Encounters:  12/26/18 153 lb (69.4 kg)  09/30/18 160 lb 15 oz (73 kg)  09/27/18 161 lb (73 kg)   Constitutional: normal weight, in NAD Eyes:  PERRLA, EOMI, no exophthalmos ENT: moist mucous membranes, no thyromegaly, no cervical lymphadenopathy Cardiovascular: RRR, No MRG Respiratory: CTA B Gastrointestinal: abdomen soft, NT, ND, BS+ Musculoskeletal: no deformities, strength intact in all 4 Skin: moist, warm, no rashes Neurological: no tremor with outstretched hands, DTR normal in all 4  ASSESSMENT: 1. DM1, insulin-dependent, uncontrolled, with complications - cerebro-vascular ds - h/o TIA 0/2016 - PN Sees cardiology >> Dr. Maurice Small - [facility]  PLAN:  1. Patient with longstanding, uncontrolled, type 1 diabetes, previously on an Omni pod insulin pump, then the Medtronic 670 G insulin pump.  However, she is now off insulin pump completely after presenting in DKA 09/2018.  At that time, she was suspected to have had a stroke, but this was ruled out by an MRI.  She had carotid Dopplers afterwards and these were normal.  After this, she was afraid to restart her insulin pump and is now on a basal-bolus insulin regimen.  At last visit, HbA1c was slightly improved, but still above target, at 8.7%.  We modified her insulin doses at that time.  - I suggested to:  Patient Instructions  Please continue: - Lantus 20 units in am and 15 at night - Novolog: -6-8 units before a breakfast -10-14 units before lunch -14-16 units before dinner - Sliding scale Novolog:  150-200: + 1 unit 201-250: + 2 units 251-300: + 3 units >301: + 4 units  Please return in 3-4 months with your sugar log.   - we checked her HbA1c: 7%  - advised to check sugars at different times of the day - 4x a day, rotating check times.  She is using a freestyle libre CGM. - advised for yearly eye exams >> she is UTD - return to clinic in 3-4 months    - time spent with the patient: 40 min, of which >50% was spent in reviewing her CGM downloads, discussing her hypo- and hyper-glycemic episodes, reviewing previous labs and pump settings and developing a plan  to avoid hypo- and hyper-glycemia.   Philemon Kingdom, MD PhD [facility]

## 2019-02-23 NOTE — Patient Instructions (Signed)
Please continue: - Lantus 20 units in am and 15 at night - Novolog: -6-8 units before a breakfast -10-14 units before lunch -14-16 units before dinner - Sliding scale Novolog:  150-200: + 1 unit 201-250: + 2 units 251-300: + 3 units >301: + 4 units  Please return in 3-4 months with your sugar log.

## 2019-03-17 DIAGNOSIS — E1065 Type 1 diabetes mellitus with hyperglycemia: Secondary | ICD-10-CM | POA: Diagnosis not present

## 2019-04-05 ENCOUNTER — Encounter: Payer: Self-pay | Admitting: Neurology

## 2019-04-05 ENCOUNTER — Other Ambulatory Visit: Payer: Self-pay

## 2019-04-05 ENCOUNTER — Ambulatory Visit (INDEPENDENT_AMBULATORY_CARE_PROVIDER_SITE_OTHER): Payer: Medicare HMO | Admitting: Neurology

## 2019-04-05 VITALS — BP 102/62 | HR 80 | Temp 99.5°F | Ht 67.0 in | Wt 161.1 lb

## 2019-04-05 DIAGNOSIS — R413 Other amnesia: Secondary | ICD-10-CM

## 2019-04-05 DIAGNOSIS — F410 Panic disorder [episodic paroxysmal anxiety] without agoraphobia: Secondary | ICD-10-CM | POA: Diagnosis not present

## 2019-04-05 MED ORDER — DULOXETINE HCL 30 MG PO CPEP: ORAL_CAPSULE | ORAL | 3 refills | Status: DC

## 2019-04-05 NOTE — Patient Instructions (Signed)
We will start Cymbalta for the diabetic nerve pain and for the depression.   Cymbalta (duloxetine) is an antidepressant medication that is commonly used for peripheral neuropathy pain or for fibromyalgia pain. As with any antidepressant medication, worsening depression can be seen. This medication can potentially cause headache, dizziness, sexual dysfunction, or nausea. If any problems are noted on this medication, please contact our office.

## 2019-04-05 NOTE — Progress Notes (Signed)
Reason for visit: Memory disturbance  Referring physician: Dr. Christella Scheuermann [name] is a 48 y.o. female  History of present illness:  [name] is a 48 year old right-handed white female with a history of diabetes.  In February 2020, the patient had a malfunction of her insulin pump and her blood sugars rose to over 1200.  The patient was comatose, she was admitted to the hospital and was in the hospital least 3 to 4 days.  The patient has little recollection of what occurred around that time.  Since that hospitalization, she has noted some issues with memory and concentration.  She has had to rely upon a pill dispenser but she still may forget to take her medications at times.  She is keeping a calendar to keep up with appointments.  She is no longer able to handle her finances.  She is driving a motor vehicle, but she does have occasional problems with getting lost.  She was seen by Dr. 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  The patient was not able to tolerate Aricept she felt that this was making her cognitive issues worse.  She has stopped the drug therefore.  The patient goes on to say that she is having significant issues with depression and anxiety.  She is having daily panic attacks.  She is not on any antidepressant medications, but she does take diazepam 5 mg 3 times daily.  The patient is on Lyrica, she claims the dose has gradually been increased for her diabetic nerve pain, she currently is on 200 mg twice daily.  The patient does report some mild balance troubles, but no falls.  She has some slight difficulty controlling the bladder, she does have some constipation issues with the bowels.  She reported some problems with depression and anxiety before the February hospitalization, but this has clearly worsened since that time.  She had no memory problems previously.  She is sent to this office for an  evaluation.  MRI of the brain has been done and is relatively unremarkable.  Past Medical History:  Diagnosis Date   Benign paroxysmal vertigo    Cervicalgia    Depression    Diabetes ([place])    Type1   Diabetes mellitus without complication ([facility])    GERD (gastroesophageal reflux disease)    Hiatal hernia    History of cardiac catheterization    Normal coronary arteries 2016   History of TIA (transient ischemic attack)    Hyperlipidemia    Hypertension    IBS (irritable bowel syndrome)    Migraine headache    Panic disorder with agoraphobia    Peripheral neuropathy    Subungual hematoma of digit of hand    Tendonitis    Vertigo     Past Surgical History:  Procedure Laterality Date   ABDOMINAL HYSTERECTOMY     APPENDECTOMY     CARDIAC CATHETERIZATION   last in 2009   x 4, normal coronary arteries   CARDIAC CATHETERIZATION N/A 03/26/2015   Procedure: Left Heart Cath and Coronary Angiography;  Surgeon: Wellington Hampshire, MD;  Location: [place] [facility];  Service: Cardiovascular;  Laterality: N/A;   CARPAL TUNNEL RELEASE Right 08/24/2018   Procedure: CARPAL TUNNEL RELEASE;  Surgeon: Carole Civil, MD;  [facility];  Service: Orthopedics;  Laterality: Right;   CESAREAN SECTION     CHOLECYSTECTOMY     COLONOSCOPY WITH  PROPOFOL N/A 01/09/2018   Procedure: COLONOSCOPY WITH PROPOFOL;  Surgeon: Rogene Houston, MD;  [facility];  Service: Endoscopy;  Laterality: N/A;   CYST EXCISION     right breast   ESOPHAGOGASTRODUODENOSCOPY (EGD) WITH PROPOFOL N/A 01/09/2018   Procedure: ESOPHAGOGASTRODUODENOSCOPY (EGD) WITH PROPOFOL;  Surgeon: Rogene Houston, MD;  [facility];  Service: Endoscopy;  Laterality: N/A;   TOOTH EXTRACTION Bilateral 02/16/2018   Procedure: 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 She has a 5.50 pack-year smoking history. She has never used smokeless tobacco. She reports current alcohol use. She reports that she does not use drugs.  Medications:  Prior to Admission medications   Medication Sig Start Date End Date Taking? Authorizing Provider  aspirin EC 81 MG tablet Take 1 tablet (81 mg total) by mouth daily. 05/03/18  Yes Satira Sark, MD  Calcium Polycarbophil (FIBERCON PO) Take 1,000 mg by mouth daily.    Yes [provider]  cholecalciferol (VITAMIN D3) 25 MCG (1000 UT) tablet Take 1,000 Units by mouth daily.   Yes [provider]  Continuous Blood Gluc Sensor (FREESTYLE LIBRE 14 DAY SENSOR) MISC 1 each by Does not apply route every 14 (fourteen) days. 08/04/18  Yes Philemon Kingdom, MD  diazepam (VALIUM) 10 MG tablet Take 10 mg by mouth every 8 (eight) hours as needed for anxiety.  12/27/17  Yes [provider]  dimenhyDRINATE (DRAMAMINE) 50 MG tablet Take 50 mg by mouth every 8 (eight) hours as needed for nausea.  03/16/18  Yes [provider]  docusate sodium (COLACE) 100 MG capsule Take 100-200 mg by mouth See admin instructions. Take 100 mg by mouth in the morning and 200 mg in the evening   Yes [provider]  EPIPEN 2-PAK 0.3 MG/0.3ML SOAJ injection Inject 0.3 mg into the muscle once.  02/13/16  Yes [provider]  estradiol (ESTRACE) 1 MG tablet Take 1 mg by mouth daily.   Yes [provider]  GLUCAGEN HYPOKIT 1 MG SOLR injection Inject 1 mg into the skin once as needed for low blood  sugar.  03/16/18  Yes [provider]  ibuprofen (ADVIL,MOTRIN) 800 MG tablet Take 800 mg by mouth every 8 (eight) hours as needed for moderate pain.   Yes [provider]  insulin aspart (NOVOLOG) 100 UNIT/ML injection Inject 30-40 Units into the skin daily. Inject 30-40 units via pump daily 10/01/18  Yes Johnson, Clanford L, MD  lisinopril (PRINIVIL,ZESTRIL) 5 MG tablet Take 5 mg by mouth daily. 06/10/15  Yes [provider]  LYRICA 200 MG capsule Take 200 mg by mouth 2 (two) times daily.  08/19/15  Yes [provider]  Melatonin 5 MG TABS Take 10 mg by mouth at bedtime as needed (sleep).  03/16/18  Yes [provider]  Multiple Vitamin (MULTIVITAMIN WITH  MINERALS) TABS tablet Take 1 tablet by mouth daily.   Yes [provider]  omega-3 fish oil (MAXEPA) 1000 MG CAPS capsule Take 1 capsule by mouth daily.  03/16/18  Yes [provider]  oxyCODONE-acetaminophen (PERCOCET) 10-325 MG tablet Take 1 tablet by mouth every 6 (six) hours as needed for pain. 08/24/18 08/24/19 Yes Carole Civil, MD  pravastatin (PRAVACHOL) 10 MG tablet Take 10 mg by mouth at bedtime. 05/29/15  Yes [provider]  tiZANidine (ZANAFLEX) 4 MG tablet Take 4 mg by mouth every 8 (eight) hours as needed for muscle spasms.    Yes [provider]  vitamin B-12 (CYANOCOBALAMIN) 1000 MCG tablet Take 2,000 mcg by mouth daily.   Yes [provider]      Allergies  Allergen Reactions   Zocor [Simvastatin] Other (See Comments)    Muscle aches and pains   Clindamycin/Lincomycin Rash   Codeine Rash   Penicillins Rash    Has patient had a PCN reaction causing immediate rash, facial/tongue/throat swelling, SOB or lightheadedness with hypotension: Yes Has patient had a PCN reaction causing severe rash involving mucus membranes or skin necrosis: Yes Has patient had a PCN reaction that required hospitalization: Yes Has patient had a PCN reaction  occurring within the last 10 years: No If all of the above answers are "NO", then may proceed with Cephalosporin use.    Sulfa Antibiotics Rash         ROS:  Out of a complete 14 system review of symptoms, the patient complains only of the following symptoms, and all other reviewed systems are negative.  Memory disturbance Anxiety, depression, panic attacks Right leg pain  Blood pressure 102/62, pulse 80, temperature 99.5 F (37.5 C), temperature source Temporal, height 5\' 7"  (1.702 m), weight 161 lb 2 oz (73.1 kg), SpO2 95 %.  Physical Exam  General: The patient is alert and cooperative at the time of the examination.  Eyes: Pupils are equal, round, and reactive to light. Discs are flat bilaterally.  Neck: The neck is supple, no carotid bruits are noted.  Respiratory: The respiratory examination is clear.  Cardiovascular: The cardiovascular examination reveals a regular rate and rhythm, no obvious murmurs or rubs are noted.  Skin: Extremities are without significant edema.  Neurologic Exam  Mental status: The patient is alert and oriented x 3 at the time of the examination. The Mini-Mental Status Examination done today shows a total score 26/30.  Cranial nerves: Facial symmetry is present. There is good sensation of the face to pinprick and soft touch bilaterally. The strength of the facial muscles and the muscles to head turning and shoulder shrug are normal bilaterally. Speech is well enunciated, no aphasia or dysarthria is noted. Extraocular movements are full. Visual fields are full. The tongue is midline, and the patient has symmetric elevation of the soft palate. No obvious hearing deficits are noted.  Motor: The motor testing reveals 5 over 5 strength of all 4 extremities. Good symmetric motor tone is noted throughout.  Sensory: Sensory testing is intact to pinprick, soft touch, vibration sensation, and position sense on all 4 extremities, with exception some  decrease in pinprick and vibration sensation on the right foot as compared to the left. No evidence of extinction is noted.  Coordination: Cerebellar testing reveals good finger-nose-finger and heel-to-shin bilaterally.  Gait and station: Gait is normal. Tandem gait is normal. Romberg is negative. No drift is seen.  Reflexes: Deep tendon reflexes are symmetric, but are depressed  bilaterally. Toes are downgoing bilaterally.   Assessment/Plan:  1.  Reported memory disturbance  2.  Anxiety and depression, panic disorder  The patient had a significant event of elevated blood sugars in February 2020, she claims to have had a cognitive deficit since that time.  However, the patient is also had a concurrent significant worsening of depression and anxiety and she is having daily panic attacks.  She is also on medication such as diazepam and Lyrica which may impair cognitive functioning.  The patient will be placed on Cymbalta as an antidepressant but also to help her diabetic nerve pain.  If this results in good benefit, we may be able to back off the dose of Lyrica in the future.  The patient will be sent for formal neuropsychological evaluation to determine whether there is an organic memory disturbance or if the anxiety and depression is the dominant reason for her cognitive decline.  She will follow-up here in 6 months.  She will call for any dose adjustments of the medication.  The patient was referred to Dr. Toy Care from psychiatry.  Jill Alexanders MD 04/05/2019 3:10 PM  [facility] [address] [place], [place] [address]  Phone [phone] Fax [phone]

## 2019-04-10 IMAGING — RF DG COLON W/ WATER SOL CM
15 of 24 series · 15 of 24 positions shown · non-contrast
Comparison: None.

CLINICAL DATA: Constipation, no relief with laxatives.

EXAM:
COLON WITH WATER SOLUTION CONTRAST

[Series 1: cp_standard · 0.26mm/px · 1 of 1 slices shown (1 of 15)]
[im 1/1]
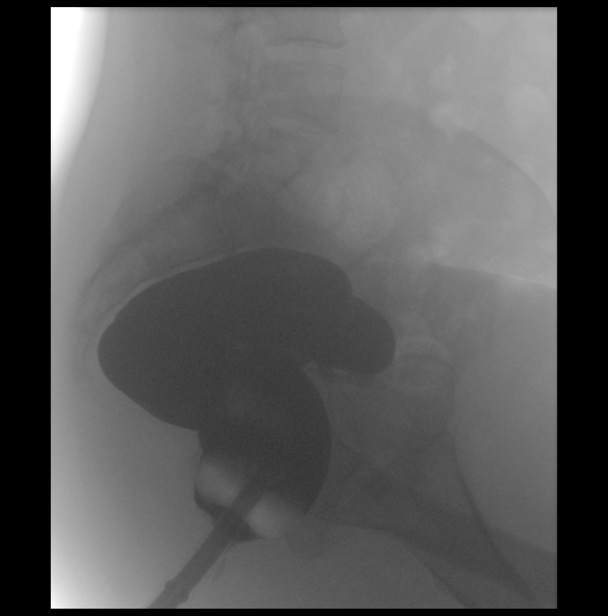

[Series 3: cp_standard · 0.29mm/px · 1 of 1 slices shown (2 of 15)]
[im 1/1]
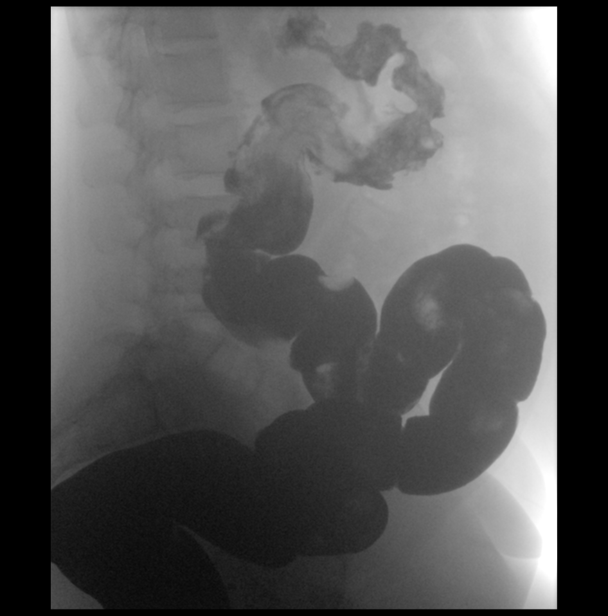

[Series 5: cp_standard · 0.27mm/px · 1 of 1 slices shown (3 of 15)]
[im 1/1]
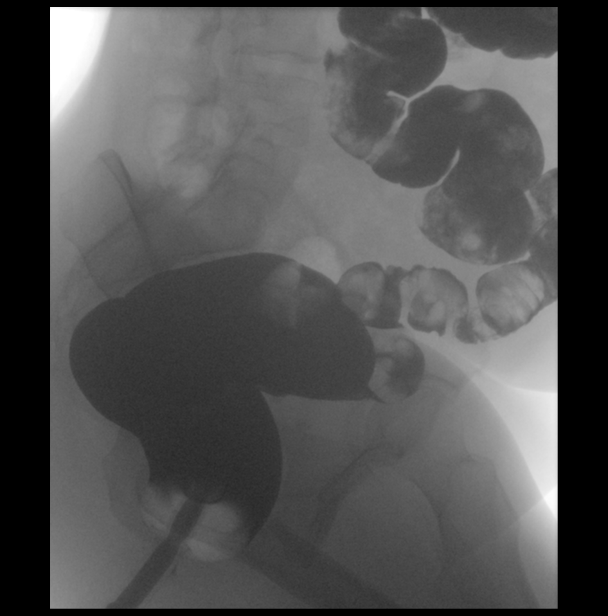

[Series 6: cp_standard · 0.27mm/px · 1 of 1 slices shown (4 of 15)]
[im 1/1]
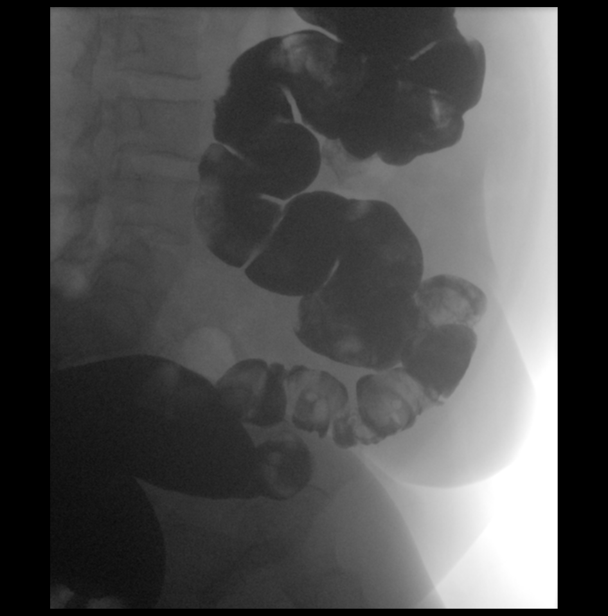

[Series 8: cp_standard · 0.27mm/px · 1 of 1 slices shown (5 of 15)]
[im 1/1]
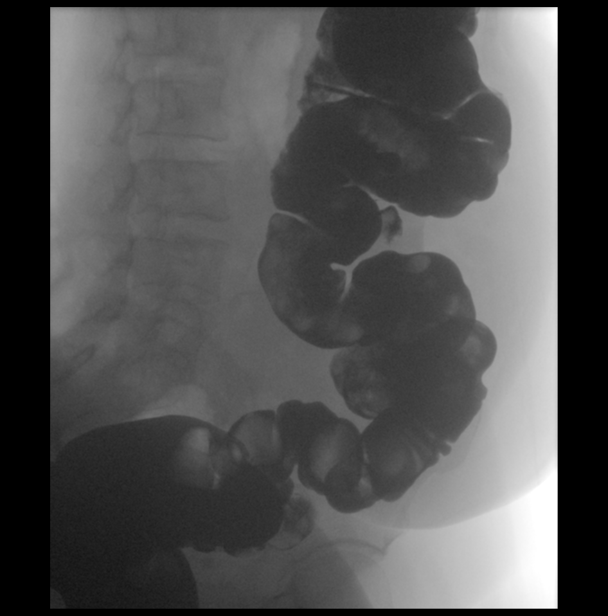

[Series 9: cp_standard · 0.28mm/px · 1 of 1 slices shown (6 of 15)]
[im 1/1]
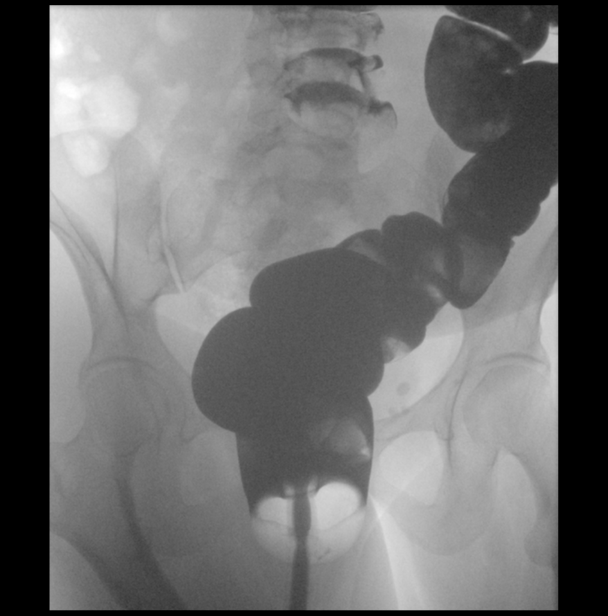

[Series 11: cp_standard · 0.27mm/px · 1 of 1 slices shown (7 of 15)]
[im 1/1]
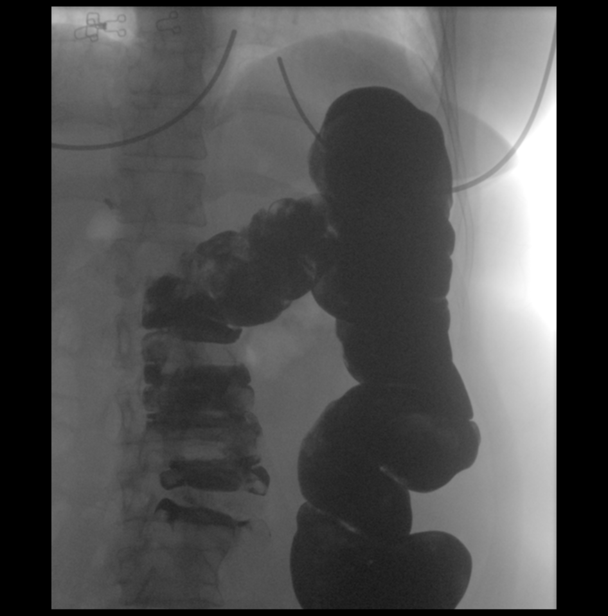

[Series 13: cp_standard · 0.28mm/px · 1 of 1 slices shown (8 of 15)]
[im 1/1]
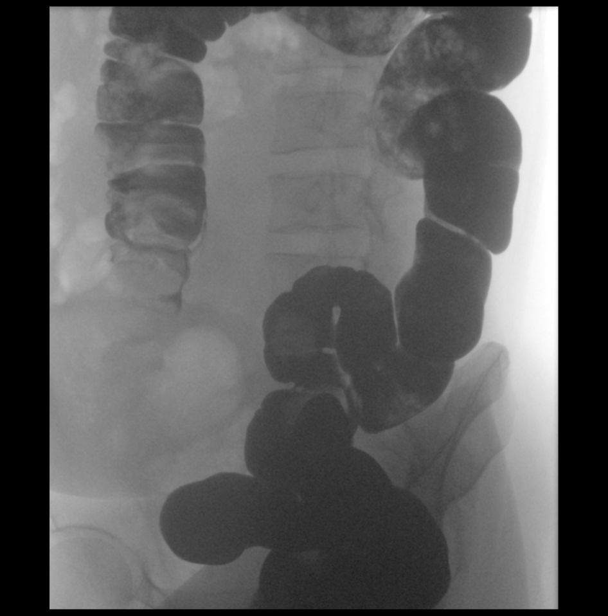

[Series 14: cp_standard · 0.27mm/px · 1 of 1 slices shown (9 of 15)]
[im 1/1]
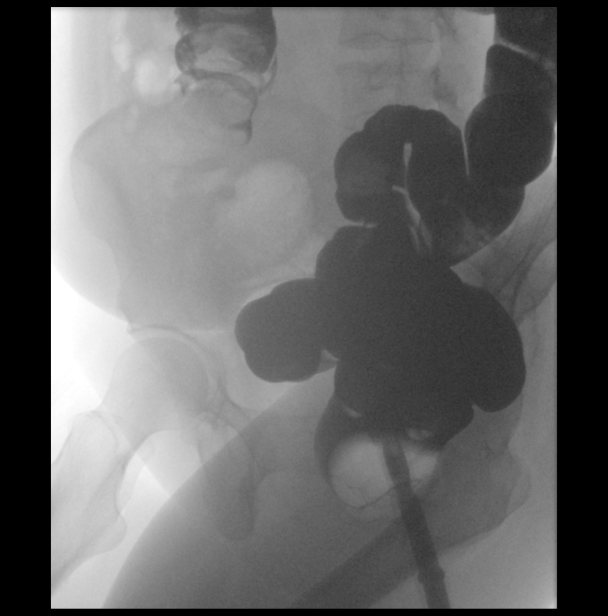

[Series 16: cp_standard · 0.27mm/px · 1 of 1 slices shown (10 of 15)]
[im 1/1]
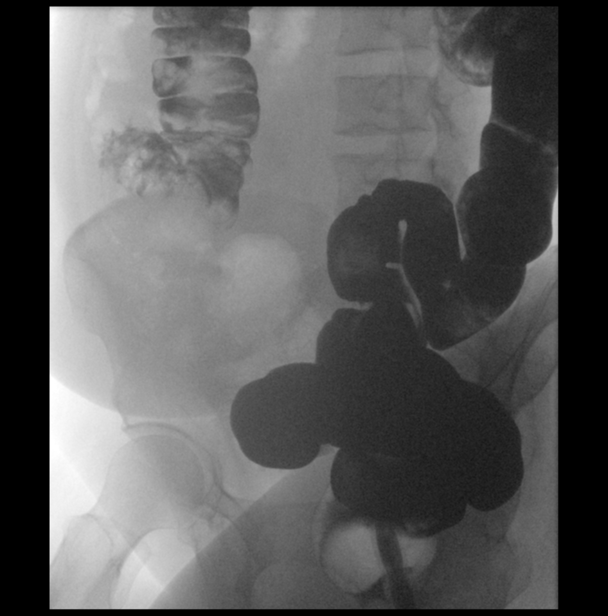

[Series 17: cp_standard · 0.27mm/px · 1 of 1 slices shown (11 of 15)]
[im 1/1]
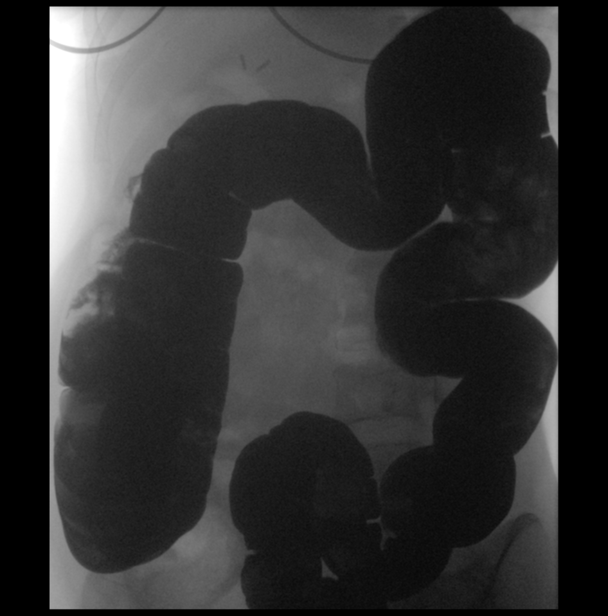

[Series 19: cp_standard · 0.27mm/px · 1 of 1 slices shown (12 of 15)]
[im 1/1]
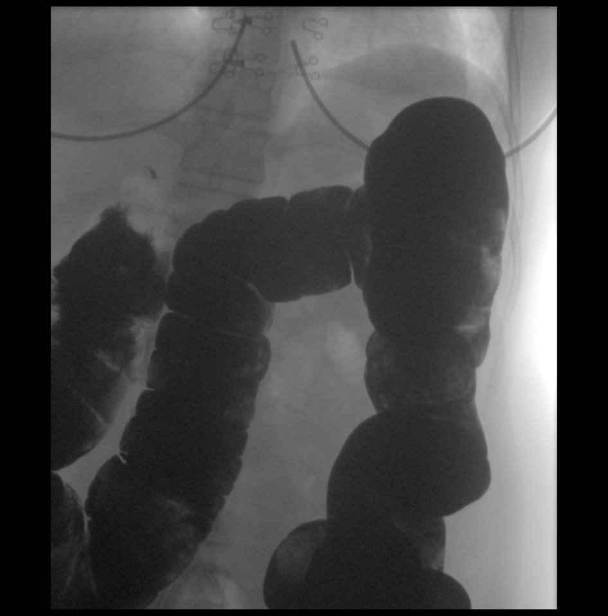

[Series 21: cp_standard · 0.28mm/px · 1 of 1 slices shown (13 of 15)]
[im 1/1]
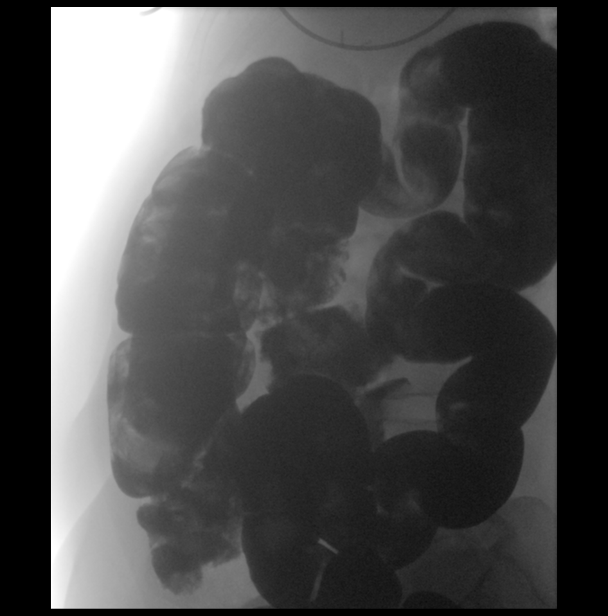

[Series 22: cp_standard · 0.28mm/px · 1 of 1 slices shown (14 of 15)]
[im 1/1]
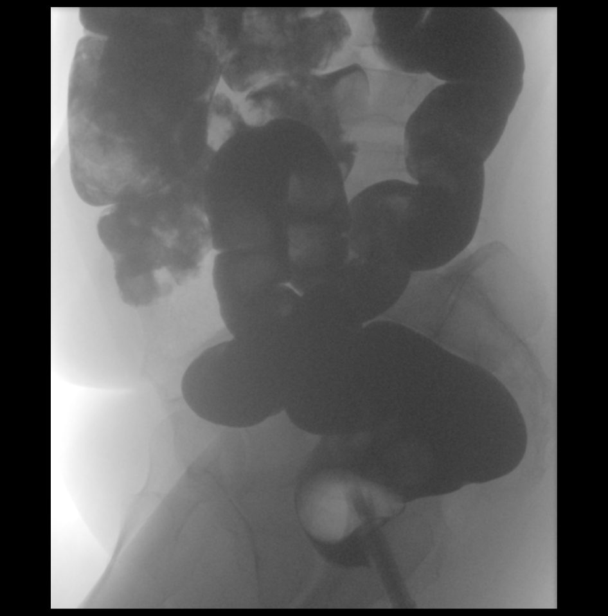

[Series 24: cp_standard · 0.28mm/px · 1 of 1 slices shown (15 of 15)]
[im 1/1]
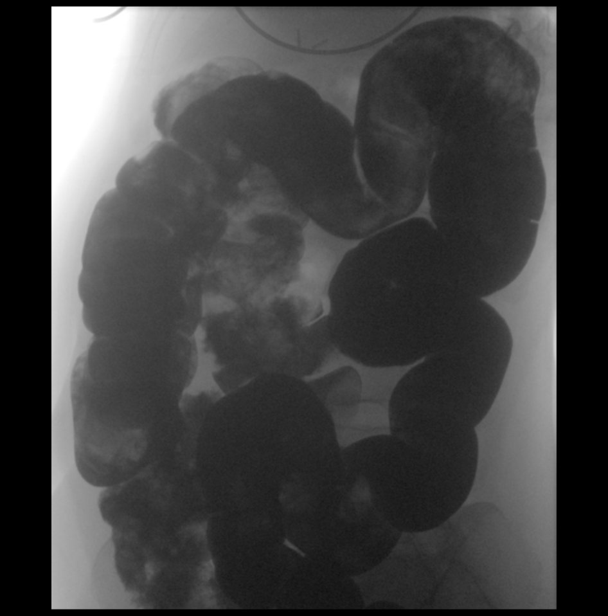

[15 of 24 positions shown; findings below may reference images not displayed]

FINDINGS: No evidence of colonic stricture. There is a large amount of
retained stool, particularly in the right colon and transverse
colon. Reflux of contrast into the distal small bowel with
fecalization of the distal ileum.
IMPRESSION: Large stool burden in the colon, particularly right colon and
transverse colon. No stricture.

## 2019-04-11 ENCOUNTER — Ambulatory Visit: Payer: Medicare HMO | Admitting: Orthopedic Surgery

## 2019-04-16 ENCOUNTER — Ambulatory Visit: Payer: Medicare HMO | Admitting: Orthopedic Surgery

## 2019-04-17 DIAGNOSIS — J069 Acute upper respiratory infection, unspecified: Secondary | ICD-10-CM | POA: Diagnosis not present

## 2019-04-17 DIAGNOSIS — R110 Nausea: Secondary | ICD-10-CM | POA: Diagnosis not present

## 2019-04-17 DIAGNOSIS — E1065 Type 1 diabetes mellitus with hyperglycemia: Secondary | ICD-10-CM | POA: Diagnosis not present

## 2019-04-17 DIAGNOSIS — Z20828 Contact with and (suspected) exposure to other viral communicable diseases: Secondary | ICD-10-CM | POA: Diagnosis not present

## 2019-04-19 ENCOUNTER — Ambulatory Visit: Payer: Medicare HMO | Admitting: Internal Medicine

## 2019-04-24 DIAGNOSIS — Z79891 Long term (current) use of opiate analgesic: Secondary | ICD-10-CM | POA: Diagnosis not present

## 2019-04-24 DIAGNOSIS — I69854 Hemiplegia and hemiparesis following other cerebrovascular disease affecting left non-dominant side: Secondary | ICD-10-CM | POA: Diagnosis not present

## 2019-04-24 DIAGNOSIS — Z6825 Body mass index (BMI) 25.0-25.9, adult: Secondary | ICD-10-CM | POA: Diagnosis not present

## 2019-04-24 DIAGNOSIS — M545 Low back pain: Secondary | ICD-10-CM | POA: Diagnosis not present

## 2019-04-24 DIAGNOSIS — F349 Persistent mood [affective] disorder, unspecified: Secondary | ICD-10-CM | POA: Diagnosis not present

## 2019-04-24 DIAGNOSIS — J9801 Acute bronchospasm: Secondary | ICD-10-CM | POA: Diagnosis not present

## 2019-04-24 DIAGNOSIS — J069 Acute upper respiratory infection, unspecified: Secondary | ICD-10-CM | POA: Diagnosis not present

## 2019-04-27 ENCOUNTER — Other Ambulatory Visit: Payer: Self-pay

## 2019-04-28 DIAGNOSIS — E1065 Type 1 diabetes mellitus with hyperglycemia: Secondary | ICD-10-CM | POA: Diagnosis not present

## 2019-05-01 ENCOUNTER — Encounter: Payer: Self-pay | Admitting: Internal Medicine

## 2019-05-01 ENCOUNTER — Ambulatory Visit (INDEPENDENT_AMBULATORY_CARE_PROVIDER_SITE_OTHER): Payer: Medicare HMO | Admitting: Internal Medicine

## 2019-05-01 ENCOUNTER — Other Ambulatory Visit: Payer: Self-pay

## 2019-05-01 VITALS — BP 120/70 | HR 82 | Ht 67.0 in | Wt 159.0 lb

## 2019-05-01 DIAGNOSIS — Z23 Encounter for immunization: Secondary | ICD-10-CM | POA: Diagnosis not present

## 2019-05-01 DIAGNOSIS — E1065 Type 1 diabetes mellitus with hyperglycemia: Secondary | ICD-10-CM

## 2019-05-01 DIAGNOSIS — E1042 Type 1 diabetes mellitus with diabetic polyneuropathy: Secondary | ICD-10-CM

## 2019-05-01 LAB — POCT GLYCOSYLATED HEMOGLOBIN (HGB A1C): Hemoglobin A1C: 9.2 % — AB (ref 4.0–5.6)

## 2019-05-01 MED ORDER — LANTUS SOLOSTAR 100 UNIT/ML ~~LOC~~ SOPN: PEN_INJECTOR | SUBCUTANEOUS | 3 refills | Status: DC

## 2019-05-01 NOTE — Patient Instructions (Addendum)
Please change: - Lantus  20 >> 25 units in am and 15 >> 10 at night - Novolog: -6-8 >> 8-10 units before a breakfast -10-14 >> 2-3 units before lunch, if you have a small meal -14-16 units before dinner - Sliding scale Novolog:  150-200: + 1 unit 201-250: + 2 units >250: + 3 units  Please return in 3-4 months with your sugar log.

## 2019-05-01 NOTE — Progress Notes (Signed)
Patient ID: [name], female   DOB: 1971/06/08, 48 y.o.   MRN: [id number]  HPI: [name] is a 48 y.o.-year-old female, returning for f/u for DM1, dx in 89 (48 y/o), uncontrolled, with complications (cerebro-vascular ds - h/o TIA 0/2016, PN, DR). Last visit 4 months ago. Insurance: [employer].  She has bronchitis >> tested negative for Covid19.  In 09/2018 she presented to the hospital with right-sided weakness and was suspected for stroke (ruled out by MRI)  She was in DKA at that time - pump failed at that time.  After this, she disconnected the pump completely and is now on basal/bolus insulin regimen.  She does have a freestyle libre CGM.  Sugars decreased after her insurance switched her from Humalog to NovoLog.  Last hemoglobin A1c was: Lab Results  Component Value Date   HGBA1C 8.7 (A) 12/26/2018   HGBA1C 8.8 (H) 09/29/2018   HGBA1C 8.5 (A) 08/22/2018   She was previously on an Omni pod, and then on Medtronic 670 G insulin pump but she could not afford the Enlite CGM.  Previous regimen: - Pump settings:  - basal rates: 12 am: 0.70 units/h >> 0.80 8 am: 0.80 - ICR:   12 am: 9   1 pm: 9 - target: 130-130 - ISF: 45 - Insulin on Board: 4h - bolus wizard: on - bolus wizard: on TDD from basal insulin: 62% (18 units) TDD from bolus insulin: 38% (11 units) - extended bolusing: not using - changes infusion site: q3 days - Meter: Omnipod meter  She is now on (changes at last visit are shown in bold): - Lantus 15 >> 20 units in am and 15 at night - Novolog: -10-12 >> 6-8 units before a breakfast -6-10 >> 10-14 units before lunch -14-16 units before dinner - Sliding scale Novolog:  150-200: + 1 unit 201-250: + 2 units 251-300: + 3 units >301: + 4 units  She checks her sugars 4 times a day with a freestyle libre CGM:   Prev:   Prev.:   CGM parameters: - average: 234+/-68 >> 211 +/- 77.7% >> 209 >> 240 - Glucose variability: 39.8% >> 39.4%  (ideal less than 36%) - Percentage time CGM active: 98% - time in range:  - very low (<54): 0% >> 1% - low (54-69): 3% >> 2% - normal range (70-180): 37% >> 24% - high sugars (181-250): 26% >> 26% - very high sugars (>250): 34% >> 47%   Lowest sugar was 27 in 07/2014... 48 (Libre) >> 50s >> 40s. She does not have hypoglycemia awareness! Highest sugar was 601 ...>350 >> 1200 (!!!) -At the previous hospitalization >> 400s. No history of hypoglycemia admissions.  She had a DKA admission 09/2018.  She also had hyperglycemia ED visits and an admission in 12/2017.  -No CKD, last BUN/creatinine:  Lab Results  Component Value Date   BUN 7 12/26/2018   CREATININE 0.75 12/26/2018   -+ HL; last set of lipids: Lab Results  Component Value Date   CHOL 156 12/26/2018   HDL 44.80 12/26/2018   LDLCALC 94 12/26/2018   TRIG 86.0 12/26/2018   CHOLHDL 3 12/26/2018  On pravastatin 10. - last eye exam was in 01/2019: reported "a little bleeding"  -+ Numbness and tingling in feet and at last visit she was also complaining of this in hands.  She was previously on Neurontin but she stopped due to side effects.  Currently on Lyrica.  At last visit the vitamin B12  was higher so I advised her to decrease her supplement dose: Lab Results  Component Value Date   VITAMINB12 >1500 (H) 12/26/2018   VITAMINB12 481 12/06/2014  She did decrease the dose from 3 tablets a day to 2 tablets a day but she is not clear about exactly how many micrograms she is taking in a day.  She will check and let me know.  No history of hypothyroidism: Lab Results  Component Value Date   TSH 2.15 12/26/2018   On Diazepam.  Off Wellbutrin because of constipation   She had tooth surgery in 01/2018, Dr. Britta Mccreedy.  She also had bilateral big toe surgery in 09/2016 with Dr. Amalia Hailey.  ROS: Constitutional: no weight gain/no weight loss, no fatigue, no subjective hyperthermia, no subjective hypothermia Eyes: no blurry vision, no  xerophthalmia ENT: no sore throat, no nodules palpated in neck, no dysphagia, no odynophagia, no hoarseness Cardiovascular: no CP/no SOB/no palpitations/no leg swelling Respiratory: no cough/no SOB/no wheezing Gastrointestinal: no N/no V/no D/no C/no acid reflux Musculoskeletal: no muscle aches/no joint aches Skin: no rashes, no hair loss Neurological: no tremors/+ numbness/+ tingling/no dizziness  I reviewed pt's medications, allergies, PMH, social hx, family hx, and changes were documented in the history of present illness. Otherwise, unchanged from my initial visit note.  Past Medical History:  Diagnosis Date   Benign paroxysmal vertigo    Cervicalgia    Depression    Diabetes ([place])    Type1   Diabetes mellitus without complication (HCC)    GERD (gastroesophageal reflux disease)    Hiatal hernia    History of cardiac catheterization    Normal coronary arteries 2016   History of TIA (transient ischemic attack)    Hyperlipidemia    Hypertension    IBS (irritable bowel syndrome)    Migraine headache    Panic disorder with agoraphobia    Peripheral neuropathy    Subungual hematoma of digit of hand    Tendonitis    Vertigo    Past Surgical History:  Procedure Laterality Date   ABDOMINAL HYSTERECTOMY     APPENDECTOMY     CARDIAC CATHETERIZATION   last in 2009   x 4, normal coronary arteries   CARDIAC CATHETERIZATION N/A 03/26/2015   Procedure: Left Heart Cath and Coronary Angiography;  Surgeon: Wellington Hampshire, MD;  Location: [facility];  Service: Cardiovascular;  Laterality: N/A;   CARPAL TUNNEL RELEASE Right 08/24/2018   Procedure: CARPAL TUNNEL RELEASE;  Surgeon: Carole Civil, MD;  Location: [facility];  Service: Orthopedics;  Laterality: Right;   CESAREAN SECTION     CHOLECYSTECTOMY     COLONOSCOPY WITH PROPOFOL N/A 01/09/2018   Procedure: COLONOSCOPY WITH PROPOFOL;  Surgeon: Rogene Houston, MD;  [facility];  Service:  Endoscopy;  Laterality: N/A;   CYST EXCISION     right breast   ESOPHAGOGASTRODUODENOSCOPY (EGD) WITH PROPOFOL N/A 01/09/2018   Procedure: ESOPHAGOGASTRODUODENOSCOPY (EGD) WITH PROPOFOL;  Surgeon: Rogene Houston, MD;  [facility];  Service: Endoscopy;  Laterality: N/A;   TOOTH EXTRACTION Bilateral 02/16/2018   Procedure: 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 RIGHT MAXILLARY SINUS ANTROSTOMY;  Surgeon: Diona Browner, DDS;  Location: [facility];  Service: Oral Surgery;  Laterality: Bilateral;   TUBAL LIGATION     History   Social History   Marital Status: Divorced    Spouse Name: N/A   Number of Children: 1   Occupational History   disabled  Social History Main Topics   Smoking status: Current Every Day Smoker -- 0.50 packs/day for 11 years   Smokeless tobacco: Never Used     Comment: patient is aware that she needs to quit smoking   Alcohol Use: No   Drug Use: No   Current Outpatient Medications on File Prior to Visit  Medication Sig Dispense Refill   aspirin EC 81 MG tablet Take 1 tablet (81 mg total) by mouth daily. 90 tablet 3   Calcium Polycarbophil (FIBERCON PO) Take 1,000 mg by mouth daily.      cholecalciferol (VITAMIN D3) 25 MCG (1000 UT) tablet Take 1,000 Units by mouth daily.     Continuous Blood Gluc Sensor (FREESTYLE LIBRE 14 DAY SENSOR) MISC 1 each by Does not apply route every 14 (fourteen) days. 2 each 2   diazepam (VALIUM) 5 MG tablet Take 5 mg by mouth every 8 (eight) hours as needed for anxiety.     dimenhyDRINATE (DRAMAMINE) 50 MG tablet Take 50 mg by mouth every 8 (eight) hours as needed for nausea.      docusate sodium (COLACE) 100 MG capsule Take 100-200 mg by mouth See admin instructions. Take 100 mg by mouth in the morning and 200 mg in the evening     DULoxetine (CYMBALTA) 30 MG capsule One tablet a day for 1 week, then take one twice a day 60 capsule 3   EPIPEN 2-PAK 0.3 MG/0.3ML SOAJ injection Inject 0.3 mg into  the muscle once.      estradiol (ESTRACE) 1 MG tablet Take 1 mg by mouth daily.     GLUCAGEN HYPOKIT 1 MG SOLR injection Inject 1 mg into the skin once as needed for low blood sugar.      ibuprofen (ADVIL,MOTRIN) 800 MG tablet Take 800 mg by mouth every 8 (eight) hours as needed for moderate pain.     insulin aspart (NOVOLOG) 100 UNIT/ML injection Inject 30-40 Units into the skin daily. Inject 30-40 units via pump daily     lisinopril (PRINIVIL,ZESTRIL) 5 MG tablet Take 5 mg by mouth daily.  2   LYRICA 200 MG capsule Take 200 mg by mouth 2 (two) times daily.      Melatonin 5 MG TABS Take 10 mg by mouth at bedtime as needed (sleep).      Multiple Vitamin (MULTIVITAMIN WITH MINERALS) TABS tablet Take 1 tablet by mouth daily.     omega-3 fish oil (MAXEPA) 1000 MG CAPS capsule Take 1 capsule by mouth daily.      oxyCODONE-acetaminophen (PERCOCET) 10-325 MG tablet Take 1 tablet by mouth every 6 (six) hours as needed for pain. 20 tablet 0   pravastatin (PRAVACHOL) 10 MG tablet Take 10 mg by mouth at bedtime.  2   tiZANidine (ZANAFLEX) 4 MG tablet Take 4 mg by mouth every 8 (eight) hours as needed for muscle spasms.      vitamin B-12 (CYANOCOBALAMIN) 1000 MCG tablet Take 2,000 mcg by mouth daily.     No current facility-administered medications on file prior to visit.    Allergies  Allergen Reactions   Zocor [Simvastatin] Other (See Comments)    Muscle aches and pains   Clindamycin/Lincomycin Rash   Codeine Rash   Penicillins Rash    Has patient had a PCN reaction causing immediate rash, facial/tongue/throat swelling, SOB or lightheadedness with hypotension: Yes Has patient had a PCN reaction causing severe rash involving mucus membranes or skin necrosis: Yes Has patient had a PCN reaction that required  hospitalization: Yes Has patient had a PCN reaction occurring within the last 10 years: No If all of the above answers are "NO", then may proceed with Cephalosporin use.     Sulfa Antibiotics Rash        Family History  Problem Relation Age of Onset   Stroke Mother 13   Heart attack Mother 106   Cancer Sister        colon   Heart failure Maternal Grandmother 23   Cancer Maternal Grandfather        prostate   Heart failure Paternal Grandmother 4   Heart failure Paternal Grandfather 28   PE: BP 120/70    Pulse 82    Ht 5\' 7"  (1.702 m)    Wt 159 lb (72.1 kg)    SpO2 98%    BMI 24.90 kg/m  Body mass index is 24.9 kg/m. Wt Readings from Last 3 Encounters:  05/01/19 159 lb (72.1 kg)  04/05/19 161 lb 2 oz (73.1 kg)  12/26/18 153 lb (69.4 kg)   Constitutional: normal weight, in NAD Eyes: PERRLA, EOMI, no exophthalmos ENT: moist mucous membranes, no thyromegaly, no cervical lymphadenopathy Cardiovascular: RRR, No MRG Respiratory: CTA B Gastrointestinal: abdomen soft, NT, ND, BS+ Musculoskeletal: no deformities, strength intact in all 4 Skin: moist, warm, no rashes Neurological: no tremor with outstretched hands, DTR normal in all 4  ASSESSMENT: 1. DM1, insulin-dependent, uncontrolled, with complications - cerebro-vascular ds - h/o TIA 0/2016 - PN - DR Sees cardiology >> Dr. Maurice Small - [facility]  2.  High vitamin B12  PLAN:  1. Patient with longstanding, uncontrolled, type 1 diabetes, previously on an Omni pod insulin pump, then the Medtronic 670 G insulin pump, but without the CGM as this was expensive.  However, she was able to get the freestyle libre CGM, which she uses today.  At the beginning of the year, in 09/2018, she was admitted with DKA and suspected to have a stroke, however, this was ruled out by MRI.  She had carotid Dopplers afterwards and these were normal.  However, since then, she was afraid to restart her insulin pump and she continues on a basal-bolus insulin regimen.  At last visit, A1c was above target, at 8.7%. -At last visit, she had high blood sugars after lunch (increasing after approximately 3 PM) which  remained elevated until approximately 3 AM, after which they started to decrease.  She was also having lows in the 50s before lunch.  We discussed that these may have been due to to too much insulin with breakfast so we reduced this.  However, we increase the insulin with lunch and will also increase the a.m. Lantus. -At this visit, sugars are worse.  They are dropping overnight only to increase after breakfast.  After this, she corrects the postprandial hyperglycemia and the sugars plummet until approximately 3 PM when they start increasing again.  She is having dinner around 6 PM and the sugars increase further at that time and then they drop again during the night (please see a snapshot of her daily profile in HPI). -At this visit, we discussed about first improving her low blood sugars by reducing the Lantus at night and also avoiding drastic CBG correction by limiting her sliding scale to only 3 units.  Since her sugars increase significantly after breakfast, I advised him to increase the NovoLog with this meal.  She tells me that she is usually skipping lunch or only eating an hour ago, in which case  she is only taking 2 units of NovoLog, I would not change this.  I am hoping that improving blood sugars during the day will also improve her sugars with dinner, so I will not advise her to change the insulin before this meal.  Increasing it may also predispose her to even more drastic drop in blood sugars overnight. -I also strongly advised her to try to go back on an insulin pump and we discussed about the 2 closed-loop systems available: Medtronic 670 G+ guardian CGM (I am hoping that the CGM will be approved for her) and also the T slim X2 pump + Dexcom G6 CGM.  She will call her insurance and inquire about these. - I suggested to:  Patient Instructions  Please change: - Lantus  20 >> 25 units in am and 15 >> 10 at night - Novolog: -6-8 >> 8-10 units before a breakfast -10-14 >> 2-3 units before  lunch, if you have a small meal -14-16 units before dinner - Sliding scale Novolog:  150-200: + 1 unit 201-250: + 2 units >250: + 3 units  Please return in 3-4 months with your sugar log.   - we checked her HbA1c: 9.2% (higher) - advised to check sugars at different times of the day - 4x a day, rotating check times - advised for yearly eye exams >> she is UTD - + Flu shot today - return to clinic in 3-4 months   2.  High vitamin B12 -At last visit, we checked a B12 vitamin level due to complaints about numbness and tingling in her fingers. -B12 level was high so I advised her to decrease her supplement dose from 3 tabs a day to 2 tab a day -We will check another level at next visit.  - time spent with the patient: 40 min, of which >50% was spent in reviewing her CGM download and insulin doses, discussing her hypo- and hyper-glycemic episodes, reviewing previous labs and developing a plan to avoid hypo- and hyper-glycemia.    Philemon Kingdom, MD PhD [facility]

## 2019-05-01 NOTE — Addendum Note (Signed)
Addended by: Cardell Peach I on: 05/01/2019 12:08 PM   Modules accepted: Orders

## 2019-05-17 DIAGNOSIS — E1065 Type 1 diabetes mellitus with hyperglycemia: Secondary | ICD-10-CM | POA: Diagnosis not present

## 2019-05-30 ENCOUNTER — Encounter: Payer: Self-pay | Admitting: Orthopedic Surgery

## 2019-05-30 ENCOUNTER — Ambulatory Visit: Payer: Medicare HMO

## 2019-05-30 ENCOUNTER — Ambulatory Visit (INDEPENDENT_AMBULATORY_CARE_PROVIDER_SITE_OTHER): Payer: Medicare HMO | Admitting: Orthopedic Surgery

## 2019-05-30 ENCOUNTER — Other Ambulatory Visit: Payer: Self-pay

## 2019-05-30 VITALS — BP 105/68 | HR 66 | Ht 67.0 in | Wt 158.0 lb

## 2019-05-30 DIAGNOSIS — M79641 Pain in right hand: Secondary | ICD-10-CM | POA: Diagnosis not present

## 2019-05-30 DIAGNOSIS — G8929 Other chronic pain: Secondary | ICD-10-CM

## 2019-05-30 DIAGNOSIS — S63054A Dislocation of other carpometacarpal joint of right hand, initial encounter: Secondary | ICD-10-CM

## 2019-05-30 NOTE — Patient Instructions (Signed)
You have received an injection of steroids into the joint. 15% of patients will have increased pain within the 24 hours postinjection.   This is transient and will go away.   We recommend that you use ice packs on the injection site for 20 minutes every 2 hours and extra strength Tylenol 2 tablets every 8 as needed until the pain resolves.  If you continue to have pain after taking the Tylenol and using the ice please call the office for further instructions.  appy liniment as needed  These are the muscle and arthrits creams I recommend:  PLEASE READ THE PACKAGE INSTRUCTIONS BEFORE USING   Ben Gay arthritis cream  Icy hot vanishing gel  Aspercreme odor free  Myoflex Oderless pain reliever  Capzasin  Sportscreme  Max freeze

## 2019-05-30 NOTE — Addendum Note (Signed)
Addended byCandice Camp on: 05/30/2019 03:01 PM   Modules accepted: Orders

## 2019-05-30 NOTE — Progress Notes (Signed)
Chief Complaint  Patient presents with  . Hand Pain    has painful knots palm of hand/ right     48 year old female had a carpal tunnel release back in January had a postop wound infection presents now with painful nodules in the palmar aspect of the right hand with pain at the basal joint of the right thumb.  She says that the knots cause increased pressure in the right thumb leading to pain in that area at the [facility] joint with decreased grip pinch ADLs fine motor tasks and residual numbness from the carpal tunnel release  Symptoms have been present now for 1 to 2 months  Review of Systems  Constitutional: Positive for malaise/fatigue. Negative for chills, fever and weight loss.  Cardiovascular: Negative.   Musculoskeletal: Positive for back pain and joint pain.  Neurological: Positive for loss of consciousness.  Psychiatric/Behavioral: Positive for depression and memory loss.  All other systems reviewed and are negative.  Past Medical History:  Diagnosis Date  . Benign paroxysmal vertigo   . Cervicalgia   . Depression   . Diabetes ([place])    Type1  . Diabetes mellitus without complication ([place])   . GERD (gastroesophageal reflux disease)   . Hiatal hernia   . History of cardiac catheterization    Normal coronary arteries 2016  . History of TIA (transient ischemic attack)   . Hyperlipidemia   . Hypertension   . IBS (irritable bowel syndrome)   . Migraine headache   . Panic disorder with agoraphobia   . Peripheral neuropathy   . Subungual hematoma of digit of hand   . Tendonitis   . Vertigo     Past Surgical History:  Procedure Laterality Date  . ABDOMINAL HYSTERECTOMY    . APPENDECTOMY    . CARDIAC CATHETERIZATION   last in 2009   x 4, normal coronary arteries  . CARDIAC CATHETERIZATION N/A 03/26/2015   Procedure: Left Heart Cath and Coronary Angiography;  Surgeon: Wellington Hampshire, MD;  Location: [facility];  Service: Cardiovascular;  Laterality: N/A;  . CARPAL  TUNNEL RELEASE Right 08/24/2018   Procedure: CARPAL TUNNEL RELEASE;  Surgeon: Carole Civil, MD;  [facility]: [facility];  Service: Orthopedics;  Laterality: Right;  . CESAREAN SECTION    . CHOLECYSTECTOMY    . COLONOSCOPY WITH PROPOFOL N/A 01/09/2018   Procedure: COLONOSCOPY WITH PROPOFOL;  Surgeon: Rogene Houston, MD;  [facility];  Service: Endoscopy;  Laterality: N/A;  . CYST EXCISION     right breast  . ESOPHAGOGASTRODUODENOSCOPY (EGD) WITH PROPOFOL N/A 01/09/2018   Procedure: ESOPHAGOGASTRODUODENOSCOPY (EGD) WITH PROPOFOL;  Surgeon: Rogene Houston, MD;  [facility];  Service: Endoscopy;  Laterality: N/A;  . 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 TUBAL LIGATION      BP 105/68   Pulse 66   Ht 5\' 7"  (1.702 m)   Wt 158 lb (71.7 kg)   BMI 24.75 kg/m   Body habitus ectomorphic  Awake alert and oriented x3 mood pleasant affect flat  Oriented x3  Gait and station normal  Right hand the carpal tunnel incision healed there are no signs of infection no erythema there.  She has tenderness at the basal joint she has a positive grind test there she has tenderness on the palm  at the point basilar joint in the thenar eminence  She does have nodules in the cords of the ring and long finger and a small amount in the index finger when you press there she has pain but it is in the thumb I do not think this is any significance does not appear to be related to any scarring or evidence of Dupuytren's contracture although I guess this could develop into that.  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  . Chronic hand pain, right Yes  . Dislocation of carpometacarpal joint of right hand, initial encounter    Procedure note injection right thumb  Injection right CMC joint Medication Depo-Medrol 40 mg and lidocaine 1% 2 cc The patient gave verbal consent Timeout confirmed the site of injection right CMC joint  Alcohol and ethyl chloride was used to repair the skin and then a 25-gauge needle was used to inject the [facility] joint of the right thumb  There were no complications a sterile Band-Aid was applied

## 2019-06-01 ENCOUNTER — Other Ambulatory Visit: Payer: Self-pay

## 2019-06-01 ENCOUNTER — Telehealth: Payer: Self-pay

## 2019-06-01 DIAGNOSIS — E1042 Type 1 diabetes mellitus with diabetic polyneuropathy: Secondary | ICD-10-CM

## 2019-06-01 DIAGNOSIS — E1065 Type 1 diabetes mellitus with hyperglycemia: Secondary | ICD-10-CM

## 2019-06-01 MED ORDER — LANTUS SOLOSTAR 100 UNIT/ML ~~LOC~~ SOPN: PEN_INJECTOR | SUBCUTANEOUS | 3 refills | Status: DC

## 2019-06-01 NOTE — Telephone Encounter (Signed)
LANTUS SOLOSTAR 100 UNIT/ML Solostar Pen 30 mL 3 06/01/2019    Sig: Inject 25 units in am and 10 units at night   Sent to pharmacy as: LANTUS SOLOSTAR 100 UNIT/ML Solostar Pen   E-Prescribing Status: Receipt confirmed by pharmacy (06/01/2019 2:15 PM EDT)

## 2019-06-01 NOTE — Telephone Encounter (Signed)
MEDICATION: LANTUS SOLOSTAR 100 UNIT/ML Solostar Pen  PHARMACY:  [facility] - [place], [place] - [address]  IS THIS A 90 DAY SUPPLY : yes  IS PATIENT OUT OF MEDICATION: no  IF NOT; HOW MUCH IS LEFT: tonight and maybe tomorrow to hold her   LAST APPOINTMENT DATE: @9 /29/2020  NEXT APPOINTMENT DATE:@1 /29/2021  DO WE HAVE YOUR PERMISSION TO LEAVE A DETAILED MESSAGE:  OTHER COMMENTS:    **Let patient know to contact pharmacy at the end of the day to make sure medication is ready. **  ** Please notify patient to allow 48-72 hours to process**  **Encourage patient to contact the pharmacy for refills or they can request refills through [facility]**

## 2019-06-04 DIAGNOSIS — M545 Low back pain: Secondary | ICD-10-CM | POA: Diagnosis not present

## 2019-06-04 DIAGNOSIS — F349 Persistent mood [affective] disorder, unspecified: Secondary | ICD-10-CM | POA: Diagnosis not present

## 2019-06-04 DIAGNOSIS — I69854 Hemiplegia and hemiparesis following other cerebrovascular disease affecting left non-dominant side: Secondary | ICD-10-CM | POA: Diagnosis not present

## 2019-06-04 DIAGNOSIS — Z79891 Long term (current) use of opiate analgesic: Secondary | ICD-10-CM | POA: Diagnosis not present

## 2019-06-21 ENCOUNTER — Encounter (HOSPITAL_COMMUNITY): Payer: Self-pay

## 2019-06-21 ENCOUNTER — Ambulatory Visit (HOSPITAL_COMMUNITY): Payer: Medicare HMO | Admitting: Physical Therapy

## 2019-07-03 ENCOUNTER — Ambulatory Visit (HOSPITAL_COMMUNITY): Payer: Medicare HMO | Attending: Neurology | Admitting: Physical Therapy

## 2019-07-03 ENCOUNTER — Encounter (HOSPITAL_COMMUNITY): Payer: Self-pay

## 2019-07-11 ENCOUNTER — Ambulatory Visit: Payer: Medicare HMO | Admitting: Orthopedic Surgery

## 2019-07-16 ENCOUNTER — Ambulatory Visit (INDEPENDENT_AMBULATORY_CARE_PROVIDER_SITE_OTHER): Payer: Medicare HMO | Admitting: Orthopedic Surgery

## 2019-07-16 ENCOUNTER — Other Ambulatory Visit: Payer: Self-pay

## 2019-07-16 VITALS — BP 106/69 | HR 75 | Temp 98.4°F | Ht 67.0 in | Wt 157.0 lb

## 2019-07-16 DIAGNOSIS — M1811 Unilateral primary osteoarthritis of first carpometacarpal joint, right hand: Secondary | ICD-10-CM | POA: Diagnosis not present

## 2019-07-16 NOTE — Progress Notes (Signed)
Chief Complaint  Patient presents with  . Follow-up    6 week recheck on right hand.    Follow-up status post injection CMC joint right thumb.  This is a 48 year old female had a carpal tunnel release on the right hand in January 2020 came in with [facility] joint arthritis and pain.  I gave her an injection she said it helped for about a week or so and then the pain started to return.  She also use some arthritis creams that we recommend but that did not help  She comes in today with continued pain and tenderness over the [facility] joint with some prominence of the base of the joint and a positive grind test weakness in pinch  Recommend thumb splint and follow-up by referral to hand for possible surgery or other definitive management

## 2019-07-16 NOTE — Addendum Note (Signed)
Addended byCandice Camp on: 07/16/2019 03:44 PM   Modules accepted: Orders

## 2019-07-16 NOTE — Patient Instructions (Signed)
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 Cartilage acts as a cushion between the bones and helps them move smoothly. Osteoarthritis results when cartilage in the joints gets worn down. Osteoarthritis is sometimes called "wear and tear" arthritis. Osteoarthritis is the most common form of arthritis. It often occurs in older people. It is a condition that gets worse over time (a progressive condition). Joints that are most often affected by this condition are in:  Fingers.  Toes.  Hips.  Knees.  Spine, including neck and lower back. What are the causes? This condition is caused by age-related wearing down of cartilage that covers the ends of bones. What increases the risk? The following factors may make you more likely to develop this condition:  Older age.  Being overweight or obese.  Overuse of joints, such as in athletes.  Past injury of a joint.  Past surgery on a joint.  Family history of osteoarthritis. What are the signs or symptoms? The main symptoms of this condition are pain, swelling, and stiffness in the joint. The joint may lose its shape over time. Small pieces of bone or cartilage may break off and float inside of the joint, which may cause more pain and damage to the joint. Small deposits of bone (osteophytes) may grow on the edges of the joint. Other symptoms may include:  A grating or scraping feeling inside the joint when you move it.  Popping or creaking sounds when you move. Symptoms may affect one or more joints. Osteoarthritis in a major joint, such as your knee or hip, can make it painful to walk or exercise. If you have osteoarthritis in your hands, you might not be able to grip items, twist your hand, or control small movements of your hands and fingers (fine motor skills). How is this  diagnosed? This condition may be diagnosed based on:  Your medical history.  A physical exam.  Your symptoms.  X-rays of the affected joint(s).  Blood tests to rule out other types of arthritis. How is this treated? There is no cure for this condition, but treatment can help to control pain and improve joint function. Treatment plans may include:  A prescribed exercise program that allows for rest and joint relief. You may work with a physical therapist.  A weight control plan.  Pain relief techniques, such as: ? Applying heat and cold to the joint. ? Electric pulses delivered to nerve endings under the skin (transcutaneous electrical nerve stimulation, or TENS). ? Massage. ? Certain nutritional supplements.  NSAIDs or prescription medicines to help relieve pain.  Medicine to help relieve pain and inflammation (corticosteroids). This can be given by mouth (orally) or as an injection.  Assistive devices, such as a brace, wrap, splint, specialized glove, or cane.  Surgery, such as: ? An osteotomy. This is done to reposition the bones and relieve pain or to remove loose pieces of bone and cartilage. ? Joint replacement surgery. You may need this surgery if you have very bad (advanced) osteoarthritis. Follow these instructions at home: Activity  Rest your affected joints as directed by your health care provider.  Do not drive or use heavy machinery while taking prescription pain medicine.  Exercise as directed. Your health care provider or physical therapist may recommend specific types of exercise, such as: ? Strengthening exercises. These are done to strengthen the muscles  that support joints that are affected by arthritis. They can be performed with weights or with exercise bands to add resistance. ? Aerobic activities. These are exercises, such as brisk walking or water aerobics, that get your heart pumping. ? Range-of-motion activities. These keep your joints easy to  move. ? Balance and agility exercises. Managing pain, stiffness, and swelling      If directed, apply heat to the affected area as often as told by your health care provider. Use the heat source that your health care provider recommends, such as a moist heat pack or a heating pad. ? If you have a removable assistive device, remove it as told by your health care provider. ? Place a towel between your skin and the heat source. If your health care provider tells you to keep the assistive device on while you apply heat, place a towel between the assistive device and the heat source. ? Leave the heat on for 20-30 minutes. ? Remove the heat if your skin turns bright red. This is especially important if you are unable to feel pain, heat, or cold. You may have a greater risk of getting burned.  If directed, put ice on the affected joint: ? If you have a removable assistive device, remove it as told by your health care provider. ? Put ice in a plastic bag. ? Place a towel between your skin and the bag. If your health care provider tells you to keep the assistive device on during icing, place a towel between the assistive device and the bag. ? Leave the ice on for 20 minutes, 2-3 times a day. General instructions  Take over-the-counter and prescription medicines only as told by your health care provider.  Maintain a healthy weight. Follow instructions from your health care provider for weight control. These may include dietary restrictions.  Do not use any products that contain nicotine or tobacco, such as cigarettes and e-cigarettes. These can delay bone healing. If you need help quitting, ask your health care provider.  Use assistive devices as directed by your health care provider.  Keep all follow-up visits as told by your health care provider. This is important. Where to find more information  [employer] of Arthritis and Musculoskeletal and Skin Diseases:  www.niams.SouthExposed.es  [employer] on Aging: http://kim-miller.com/  American College of Rheumatology: www.rheumatology.org Contact a health care provider if:  Your skin turns red.  You develop a rash.  You have pain that gets worse.  You have a fever along with joint or muscle aches. Get help right away if:  You lose a lot of weight.  You suddenly lose your appetite.  You have night sweats. Summary  Osteoarthritis is a type of arthritis that affects tissue covering the ends of bones in joints (cartilage).  This condition is caused by age-related wearing down of cartilage that covers the ends of bones.  The main symptom of this condition is pain, swelling, and stiffness in the joint.  There is no cure for this condition, but treatment can help to control pain and improve joint function. This information is not intended to replace advice given to you by your health care provider. Make sure you discuss any questions you have with your health care provider. Document Released: 07/19/2005 Document Revised: 07/01/2017 Document Reviewed: 03/22/2016 Elsevier Patient Education  2020 [employer].

## 2019-07-17 DIAGNOSIS — E1065 Type 1 diabetes mellitus with hyperglycemia: Secondary | ICD-10-CM | POA: Diagnosis not present

## 2019-07-19 DIAGNOSIS — J209 Acute bronchitis, unspecified: Secondary | ICD-10-CM | POA: Diagnosis not present

## 2019-07-19 DIAGNOSIS — Z20828 Contact with and (suspected) exposure to other viral communicable diseases: Secondary | ICD-10-CM | POA: Diagnosis not present

## 2019-07-19 DIAGNOSIS — R110 Nausea: Secondary | ICD-10-CM | POA: Diagnosis not present

## 2019-08-02 DIAGNOSIS — M654 Radial styloid tenosynovitis [de Quervain]: Secondary | ICD-10-CM | POA: Diagnosis not present

## 2019-08-02 DIAGNOSIS — M1811 Unilateral primary osteoarthritis of first carpometacarpal joint, right hand: Secondary | ICD-10-CM | POA: Diagnosis not present

## 2019-08-06 ENCOUNTER — Telehealth: Payer: Self-pay

## 2019-08-06 NOTE — Telephone Encounter (Signed)
She can take this sparingly, but this is not a preferred anti-inflammatory in the setting of the Covid pandemic.  Tylenol would be a safer medication.  However, she may also check with PCP for surgery.

## 2019-08-06 NOTE — Telephone Encounter (Signed)
Patient called wanting to ask the Dr is it ok for her take ibuprofen after her surgery in between for pain     Please call and advise

## 2019-08-13 ENCOUNTER — Ambulatory Visit (INDEPENDENT_AMBULATORY_CARE_PROVIDER_SITE_OTHER): Payer: Medicare HMO | Admitting: Gastroenterology

## 2019-08-20 ENCOUNTER — Encounter (INDEPENDENT_AMBULATORY_CARE_PROVIDER_SITE_OTHER): Payer: Self-pay

## 2019-08-22 ENCOUNTER — Telehealth: Payer: Self-pay

## 2019-08-22 NOTE — Telephone Encounter (Signed)
Patient called today requesting to speak to Berna Spare asked Vaughan Basta and she requested I take a message and she will call her back-patient verbalized an understanding

## 2019-08-23 NOTE — Telephone Encounter (Signed)
Patient is wanting to use the [employer] pump.  Paperwork was left up front for her to fill out and we will fax to [employer].  She will pick this up tomorrow

## 2019-08-27 ENCOUNTER — Other Ambulatory Visit: Payer: Self-pay

## 2019-08-27 ENCOUNTER — Encounter (INDEPENDENT_AMBULATORY_CARE_PROVIDER_SITE_OTHER): Payer: Self-pay | Admitting: Gastroenterology

## 2019-08-27 ENCOUNTER — Other Ambulatory Visit (INDEPENDENT_AMBULATORY_CARE_PROVIDER_SITE_OTHER): Payer: Self-pay | Admitting: Gastroenterology

## 2019-08-27 ENCOUNTER — Ambulatory Visit (INDEPENDENT_AMBULATORY_CARE_PROVIDER_SITE_OTHER): Payer: Medicare Other | Admitting: Gastroenterology

## 2019-08-27 VITALS — BP 100/69 | HR 80 | Temp 98.0°F | Ht 67.0 in | Wt 158.4 lb

## 2019-08-27 DIAGNOSIS — R194 Change in bowel habit: Secondary | ICD-10-CM

## 2019-08-27 DIAGNOSIS — Z800 Family history of malignant neoplasm of digestive organs: Secondary | ICD-10-CM | POA: Diagnosis not present

## 2019-08-27 DIAGNOSIS — K625 Hemorrhage of anus and rectum: Secondary | ICD-10-CM | POA: Diagnosis not present

## 2019-08-27 MED ORDER — DICYCLOMINE HCL 10 MG PO CAPS: 10.0000 mg | ORAL_CAPSULE | Freq: Three times a day (TID) | ORAL | 1 refills | Status: DC | PRN

## 2019-08-27 MED ORDER — LINACLOTIDE 72 MCG PO CAPS: 72.0000 ug | ORAL_CAPSULE | Freq: Every day | ORAL | 3 refills | Status: DC

## 2019-08-27 NOTE — Progress Notes (Signed)
Patient profile: [name] is a 49 y.o. female seen. Last seen in clinic on 11/2017  History of Present Illness: [name] is seen today for new onset of rectal bleeding and diarrhea, this began in November around Thanksgiving 2020.  She reports acute onset of 2 weeks of loose stool with daily rectal bleeding.  She had vomiting the initial night of onset with some right lower quadrant pain radiating to her right back.  Her baseline bowel habits prior to this was constipation.  The rectal bleeding has improved and she is now having blood approximately once a week instead of daily.  She has returned to more baseline constipation.  She is currently now having a bowel movement every couple days.  She is still having some of the right lower quadrant pain rating to her right back.   right lower quadrant pain- she does not feel that it drastically improved with defecation.  Does not worsen with eating.  She reports she has tried laxatives such as MiraLAX in the past, currently using a as needed oral pill laxative prn.  She denies any GERD symptoms.  Does have nausea slightly worse since lower abdominal symptoms began.  She denies any vomiting or dysphagia.  She avoids spicy foods.  She does not take NSAIDs products.    Wt Readings from Last 3 Encounters:  08/27/19 158 lb 6.4 oz (71.8 kg)  07/16/19 157 lb (71.2 kg)  05/30/19 158 lb (71.7 kg)     Last Colonoscopy: June 2019, incomplete, poor prep only completed to transverse colon   Last Endoscopy: none prior    Past Medical History:  Past Medical History:  Diagnosis Date  . Benign paroxysmal vertigo   . Cervicalgia   . Depression   . Diabetes ([place])    Type1  . Diabetes mellitus without complication ([place])   . GERD (gastroesophageal reflux disease)   . Hiatal hernia   . History of cardiac catheterization    Normal coronary arteries 2016  . History of TIA (transient ischemic attack)   . Hyperlipidemia   . Hypertension     . IBS (irritable bowel syndrome)   . Migraine headache   . Panic disorder with agoraphobia   . Peripheral neuropathy   . Subungual hematoma of digit of hand   . Tendonitis   . Vertigo     Problem List: Patient Active Problem List   Diagnosis Date Noted  . DKA (diabetic ketoacidoses) ([place]) 09/29/2018  . Dehydration 09/29/2018  . AKI (acute kidney injury) ([place]) 09/29/2018  . Hyperkalemia 09/29/2018  . Hyponatremia 09/29/2018  . S/P carpal tunnel release right 08/24/18 09/14/2018  . Carpal tunnel syndrome of right wrist   . Depression 01/06/2018  . Other constipation 01/05/2018  . Family hx of colon cancer 11/10/2017  . Chest pain 03/25/2015  . 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  . Migraine 03/25/2015  . Hypertension   . Hyperlipidemia   . Anxiety   . Pain in the chest   . Essential hypertension   . 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 TIA (transient ischemic attack) 08/17/2014  . Tobacco abuse   . Gastro-esophageal reflux disease without esophagitis 01/28/2014  . Peripheral neuropathy 11/05/2013    Past Surgical History: Past Surgical History:  Procedure Laterality Date  . ABDOMINAL HYSTERECTOMY    . APPENDECTOMY    . CARDIAC CATHETERIZATION   last in 2009   x 4, normal coronary arteries  .  CARDIAC CATHETERIZATION N/A 03/26/2015   Procedure: Left Heart Cath and Coronary Angiography;  Surgeon: Wellington Hampshire, MD;  Location: [place] [facility];  Service: Cardiovascular;  Laterality: N/A;  . CARPAL TUNNEL RELEASE Right 08/24/2018   Procedure: CARPAL TUNNEL RELEASE;  Surgeon: Carole Civil, MD;  Location: [facility];  Service: Orthopedics;  Laterality: Right;  . CESAREAN SECTION    . CHOLECYSTECTOMY    . COLONOSCOPY WITH PROPOFOL N/A 01/09/2018   Procedure: COLONOSCOPY WITH PROPOFOL;  Surgeon: Rogene Houston, MD;  Location: [facility];  Service: Endoscopy;  Laterality: N/A;  . CYST EXCISION      right breast  . ESOPHAGOGASTRODUODENOSCOPY (EGD) WITH PROPOFOL N/A 01/09/2018   Procedure: ESOPHAGOGASTRODUODENOSCOPY (EGD) WITH PROPOFOL;  Surgeon: Rogene Houston, MD;  Location: [facility];  Service: Endoscopy;  Laterality: N/A;  . 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 TUBAL LIGATION      Allergies: Allergies  Allergen Reactions  . Zocor [Simvastatin] Other (See Comments)    Muscle aches and pains  . Clindamycin/Lincomycin Rash  . Codeine Rash  . Penicillins Rash    Has patient had a PCN reaction causing immediate rash, facial/tongue/throat swelling, SOB or lightheadedness with hypotension: Yes Has patient had a PCN reaction causing severe rash involving mucus membranes or skin necrosis: Yes Has patient had a PCN reaction that required hospitalization: Yes Has patient had a PCN reaction occurring within the last 10 years: No If all of the above answers are "NO", then may proceed with Cephalosporin use.   . Sulfa Antibiotics Rash           Home Medications:  Current Outpatient Medications:  .  aspirin EC 81 MG tablet, Take 1 tablet (81 mg total) by mouth daily., Disp: 90 tablet, Rfl: 3 .  Calcium Polycarbophil (FIBERCON PO), Take 1,000 mg by mouth daily. , Disp: , Rfl:  .  cholecalciferol (VITAMIN D3) 25 MCG (1000 UT) tablet, Take 1,000 Units by mouth daily., Disp: , Rfl:  .  Continuous Blood Gluc Sensor (FREESTYLE LIBRE 14 DAY SENSOR) MISC, 1 each by Does not apply route every 14 (fourteen) days., Disp: 2 each, Rfl: 2 .  diazepam (VALIUM) 5 MG tablet, Take 5 mg by mouth every 8 (eight) hours as needed for anxiety., Disp: , Rfl:  .  docusate sodium (COLACE) 100 MG capsule, Take 100-200 mg by mouth See admin instructions. Take 100 mg by mouth in the morning and 200 mg in the  evening, Disp: , Rfl:  .  EPIPEN 2-PAK 0.3 MG/0.3ML SOAJ injection, Inject 0.3 mg into the muscle once. , Disp: , Rfl:  .  estradiol (ESTRACE) 1 MG tablet, Take 1 mg by mouth daily., Disp: , Rfl:  .  GLUCAGEN HYPOKIT 1 MG SOLR injection, Inject 1 mg into the skin once as needed for low blood sugar. , Disp: , Rfl:  .  insulin aspart (NOVOLOG) 100 UNIT/ML injection, Inject 30-40 Units into the skin daily. Inject 30-40 units via pump daily (Patient taking differently: Inject 30-40 Units into the skin daily. Inject 30-40 units daily), Disp: , Rfl:  .  LANTUS SOLOSTAR 100 UNIT/ML Solostar Pen, Inject 25 units in am and 10 units at night, Disp: 30 mL, Rfl: 3 .  lisinopril (PRINIVIL,ZESTRIL) 5 MG tablet, Take 5 mg by mouth  daily., Disp: , Rfl: 2 .  LYRICA 200 MG capsule, Take 200 mg by mouth 2 (two) times daily. , Disp: , Rfl:  .  Melatonin 5 MG TABS, Take 10 mg by mouth at bedtime as needed (sleep). , Disp: , Rfl:  .  omega-3 fish oil (MAXEPA) 1000 MG CAPS capsule, Take 1 capsule by mouth daily. , Disp: , Rfl:  .  pravastatin (PRAVACHOL) 10 MG tablet, Take 10 mg by mouth at bedtime., Disp: , Rfl: 2 .  tiZANidine (ZANAFLEX) 4 MG tablet, Take 4 mg by mouth every 8 (eight) hours as needed for muscle spasms. , Disp: , Rfl:  .  dicyclomine (BENTYL) 10 MG capsule, Take 1 capsule (10 mg total) by mouth 3 (three) times daily as needed (abd pain)., Disp: 30 capsule, Rfl: 1 .  dimenhyDRINATE (DRAMAMINE) 50 MG tablet, Take 50 mg by mouth every 8 (eight) hours as needed for nausea. , Disp: , Rfl:  .  DULoxetine (CYMBALTA) 30 MG capsule, One tablet a day for 1 week, then take one twice a day (Patient not taking: Reported on 08/27/2019), Disp: 60 capsule, Rfl: 3 .  ibuprofen (ADVIL,MOTRIN) 800 MG tablet, Take 800 mg by mouth every 8 (eight) hours as needed for moderate pain., Disp: , Rfl:  .  linaclotide (LINZESS) 72 MCG capsule, Take 1 capsule (72 mcg total) by mouth daily before breakfast., Disp: 30 capsule, Rfl: 3 .   Multiple Vitamin (MULTIVITAMIN WITH MINERALS) TABS tablet, Take 1 tablet by mouth daily., Disp: , Rfl:  .  vitamin B-12 (CYANOCOBALAMIN) 1000 MCG tablet, Take 2,000 mcg by mouth daily., Disp: , Rfl:    Family History: family history includes Cancer in her maternal grandfather and sister; Heart attack (age of onset: 91) in her mother; Heart failure (age of onset: 32) in her paternal grandfather; Heart failure (age of onset: 23) in her maternal grandmother; Heart failure (age of onset: 92) in her paternal grandmother; Stroke (age of onset: 50) in her mother.    Family history of colon cancer in her sister diagnosed at age 88, her father had Crohn's disease diagnosed in his 35s   Social History:   reports that she has been smoking cigarettes. She has a 5.50 pack-year smoking history. She has never used smokeless tobacco. She reports current alcohol use. She reports that she does not use drugs.   Review of Systems: Constitutional: Denies weight loss/weight gain  Eyes: No changes in vision. ENT: No oral lesions, sore throat.  GI: see HPI.  Heme/Lymph: No easy bruising.  CV: No chest pain.  GU: No hematuria.  Integumentary: No rashes.  Neuro: No headaches.  Psych: No depression/anxiety.  Endocrine: No heat/cold intolerance.  Allergic/Immunologic: No urticaria.  Resp: No cough, SOB.  Musculoskeletal: No joint swelling.    Physical Examination: BP 100/69 (BP Location: Left Arm, Patient Position: Sitting, Cuff Size: Large)   Pulse 80   Temp 98 F (36.7 C) (Temporal)   Ht '5\' 7"'  (1.702 m)   Wt 158 lb 6.4 oz (71.8 kg)   BMI 24.81 kg/m  Gen: NAD, alert and oriented x 4 HEENT: PEERLA, EOMI, Neck: supple, no JVD Chest: CTA bilaterally, no wheezes, crackles, or other adventitious sounds CV: RRR, no m/g/c/r Abd: soft, NT, ND, +BS in all four quadrants; no HSM, guarding, ridigity, or rebound tenderness Ext: no edema, well perfused with 2+ pulses, Skin: no rash or lesions noted on observed  skin Lymph: no noted LAD  Data Reviewed:  Last labs 12/2018--B12 >  1500, TSH normal, CMP normal Vit D normal.    Assessment/Plan: [name] is a 49 y.o. female   1.  Change in bowel habits-had 2 weeks of diarrhea with rectal bleeding and significant abdominal pain.  Bowel habits have gone back to her baseline constipation but she is still having rectal bleeding once a week with some right lower quadrant pain.  Given her family history of both Crohn's and colon cancer in first-degree relatives as well as incomplete last colonoscopy- colonoscopy indicated for further evaluation.  Has had some constipation despite MiraLAX, has done well on Linzess in the past and will prescribe 72 mcg, can increase dose if needed.  We will also give dicyclomine as needed for abdominal pain.  Check basic labs as above today.   Poor prep on last colonoscopy due to underlying constipation.  She is to notify me if Linzess does not help as needs to be moving stools well prior to prep.  Patient denies CP, SOB, and use of blood thinners. I discussed the risks and benefits of procedure including bleeding, perforation, infection, missed lesions, medication reactions and possible hospitalization or surgery if complications. All questions answered.    [name] was seen today for follow-up.  Diagnoses and all orders for this visit:  Rectal bleeding -     CBC with Differential -     COMPLETE METABOLIC PANEL WITH GFR -     Sed Rate (ESR) -     C-reactive protein  Change in bowel habits -     CBC with Differential -     COMPLETE METABOLIC PANEL WITH GFR -     Sed Rate (ESR) -     C-reactive protein  Family history of colon cancer -     CBC with Differential -     COMPLETE METABOLIC PANEL WITH GFR -     Sed Rate (ESR) -     C-reactive protein  Other orders -     linaclotide (LINZESS) 72 MCG capsule; Take 1 capsule (72 mcg total) by mouth daily before breakfast. -     dicyclomine (BENTYL) 10 MG capsule; Take 1  capsule (10 mg total) by mouth 3 (three) times daily as needed (abd pain).     Order for colonoscopy to be placed when Covid elective procedure restrictions allowed.   I personally performed the service, non-incident to. (WP)  Laurine Blazer, [facility]

## 2019-08-27 NOTE — Patient Instructions (Signed)
We are starting linzess 79mcg for constipation - take 30 min before breakfast.  Can use dicyclomine as needed for abd pain.   We will call to schedule colonoscopy when covid restrictions lifted, please call w/ questions in inteirm.

## 2019-08-28 ENCOUNTER — Encounter (INDEPENDENT_AMBULATORY_CARE_PROVIDER_SITE_OTHER): Payer: Self-pay

## 2019-08-28 LAB — COMPLETE METABOLIC PANEL WITH GFR
AG Ratio: 1.7 (calc) (ref 1.0–2.5)
ALT: 16 U/L (ref 6–29)
AST: 17 U/L (ref 10–35)
Albumin: 4.3 g/dL (ref 3.6–5.1)
Alkaline phosphatase (APISO): 79 U/L (ref 31–125)
BUN: 14 mg/dL (ref 7–25)
CO2: 29 mmol/L (ref 20–32)
Calcium: 9.7 mg/dL (ref 8.6–10.2)
Chloride: 101 mmol/L (ref 98–110)
Creat: 0.76 mg/dL (ref 0.50–1.10)
GFR, Est African American: 107 mL/min/{1.73_m2} (ref >=60)
GFR, Est Non African American: 92 mL/min/{1.73_m2} (ref >=60)
Globulin: 2.5 g/dL (calc) (ref 1.9–3.7)
Glucose, Bld: 194 mg/dL — ABNORMAL HIGH (ref 65–139)
Potassium: 4.5 mmol/L (ref 3.5–5.3)
Sodium: 137 mmol/L (ref 135–146)
Total Bilirubin: 0.4 mg/dL (ref 0.2–1.2)
Total Protein: 6.8 g/dL (ref 6.1–8.1)

## 2019-08-28 LAB — CBC WITH DIFFERENTIAL/PLATELET
Absolute Monocytes: 662 cells/uL (ref 200–950)
Basophils Absolute: 105 cells/uL (ref 0–200)
Basophils Relative: 1 %
Eosinophils Absolute: 179 cells/uL (ref 15–500)
Eosinophils Relative: 1.7 %
HCT: 46.7 % — ABNORMAL HIGH (ref 35.0–45.0)
Hemoglobin: 15.5 g/dL (ref 11.7–15.5)
Lymphs Abs: 2772 cells/uL (ref 850–3900)
MCH: 30 pg (ref 27.0–33.0)
MCHC: 33.2 g/dL (ref 32.0–36.0)
MCV: 90.5 fL (ref 80.0–100.0)
MPV: 11.2 fL (ref 7.5–12.5)
Monocytes Relative: 6.3 %
Neutro Abs: 6783 cells/uL (ref 1500–7800)
Neutrophils Relative %: 64.6 %
Platelets: 238 10*3/uL (ref 140–400)
RBC: 5.16 10*6/uL — ABNORMAL HIGH (ref 3.80–5.10)
RDW: 12.4 % (ref 11.0–15.0)
Total Lymphocyte: 26.4 %
WBC: 10.5 10*3/uL (ref 3.8–10.8)

## 2019-08-28 LAB — SEDIMENTATION RATE: Sed Rate: 9 mm/h (ref 0–20)

## 2019-08-28 LAB — C-REACTIVE PROTEIN: CRP: 2 mg/L (ref <8.0)

## 2019-08-31 ENCOUNTER — Encounter: Payer: Self-pay | Admitting: Internal Medicine

## 2019-08-31 ENCOUNTER — Ambulatory Visit (INDEPENDENT_AMBULATORY_CARE_PROVIDER_SITE_OTHER): Payer: Medicare Other | Admitting: Internal Medicine

## 2019-08-31 ENCOUNTER — Other Ambulatory Visit: Payer: Self-pay

## 2019-08-31 VITALS — BP 122/70 | HR 87 | Ht 67.0 in | Wt 158.0 lb

## 2019-08-31 DIAGNOSIS — E1042 Type 1 diabetes mellitus with diabetic polyneuropathy: Secondary | ICD-10-CM | POA: Diagnosis not present

## 2019-08-31 DIAGNOSIS — E1065 Type 1 diabetes mellitus with hyperglycemia: Secondary | ICD-10-CM | POA: Diagnosis not present

## 2019-08-31 LAB — POCT GLYCOSYLATED HEMOGLOBIN (HGB A1C): Hemoglobin A1C: 10.2 % — AB (ref 4.0–5.6)

## 2019-08-31 NOTE — Patient Instructions (Addendum)
Please continue: - Lantus 25 units in the morning and 15 units at night - Novolog: -10-12 units before a breakfast -4 units before lunch, if you have a small meal -14-18 units before dinner - Sliding scale Novolog:  150-180: + 1 unit 181-210: + 2 units 211-240: + 3 units 241-270: + 4 units 270-300: + 5 units >300: + 6 units  When you change to the insulin pump, start with the following settings: - basal rates: 12 am: 1 unit/h - ICR:   12 am: 1:8 - target: 130-130 - ISF: 35 - Insulin on Board: 3h  Please return in 1.5 months.

## 2019-08-31 NOTE — Progress Notes (Addendum)
Patient ID: [name], female   DOB: [birth year], 49 y.o.   MRN: [id number]  HPI: [name] is a 49 y.o.-year-old female, returning for f/u for DM1, dx in 49 (49 y/o), uncontrolled, with complications (cerebro-vascular ds - h/o TIA 0/2016, PN, DR). Last visit 4 months ago. Insurance: [employer].  Sugars decreased after her insurance switched her from Humalog to NovoLog.  Last hemoglobin A1c was: Lab Results  Component Value Date   HGBA1C 9.2 (A) 05/01/2019   HGBA1C 8.7 (A) 12/26/2018   HGBA1C 8.8 (H) 09/29/2018   She was previously on an Omni pod, and then on Medtronic 670 G insulin pump but she could not afford the Enlite CGM. She is currently on insulin inj's but will start ion a tandem t:slim pump.  Previous regimen: - Pump settings:  - basal rates: 12 am: 0.70 units/h >> 0.80 8 am: 0.80 - ICR:   12 am: 9   1 pm: 9 - target: 130-130 - ISF: 45 - Insulin on Board: 4h - bolus wizard: on - bolus wizard: on TDD from basal insulin: 62% (18 units) TDD from bolus insulin: 38% (11 units) - extended bolusing: not using - changes infusion site: q3 days - Meter: Omnipod meter  She is now on (changes at last visit are shown in bold): - Lantus  20 >> 25 units in am and 15 >> 10 at night - Novolog: -6-8 >> 8-10 units before a breakfast -10-14 >> 2-3 units before lunch, if you have a small meal -14-16 units before dinner - Sliding scale Novolog:  150-200: + 1 unit 201-250: + 2 units >250: + 3 units  She checks her sugars 4 times a day with her freestyle libre CGM:   Previously:   CGM parameters: - average: 234+/-68 >> 211 +/- 77.7% >> 209 >> 240 >> 259 - Glucose variability: 39.8% >> 39.4% >> 31.6% (ideal less than 36%) - Percentage time CGM active: 98% >> 92% - time in range:  - very low (<54): 0% >> 1% >> 0% - low (54-69): 3% >> 2% >> 1% - normal range (70-180): 37% >> 24% >> 16% - high sugars (181-250): 26% >> 26% >> 28% - very high sugars  (>250): 34% >> 47% >> 55%   Lowest sugar was 27 in 07/2014... 48 (Libre) >> 50s >> 40s >> 53. She does not have hypoglycemia awareness! Highest sugar was 601 ...>350 >> 1200 (!!!) >> 400s >> 400s. No history of hypoglycemia admissions.  She had a DKA admission 09/2018.  She also had hyperglycemia ED visits and an admission in 12/2017.  -No CKD, last BUN/creatinine:  Lab Results  Component Value Date   BUN 14 08/27/2019   CREATININE 0.76 08/27/2019   -+ HL; last set of lipids: Lab Results  Component Value Date   CHOL 156 12/26/2018   HDL 44.80 12/26/2018   LDLCALC 94 12/26/2018   TRIG 86.0 12/26/2018   CHOLHDL 3 12/26/2018  On pravastatin 10. - last eye exam was in 01/2019: Reportedly + DR -She has numbness and tingling in feet and at last visit she was also complaining of this in hands.  She was previously on Neurontin but she stopped due to side effects.  Currently on Lyrica  Reviewed vitamin B12 levels: Lab Results  Component Value Date   VITAMINB12 >1500 (H) 12/26/2018   VITAMINB12 481 12/06/2014  She did decrease the dose from 3 to 2 tablets a day after the above results returned.  No  history of hypothyroidism: Lab Results  Component Value Date   TSH 2.15 12/26/2018   On Diazepam.  Off Wellbutrin because of constipation.  She had tooth surgery in 01/2018, Dr. Britta Mccreedy.  She also had bilateral big toe surgery in 09/2016 with Dr. Amalia Hailey.  ROS: Constitutional: no weight gain/no weight loss, no fatigue, no subjective hyperthermia, no subjective hypothermia Eyes: no blurry vision, no xerophthalmia ENT: no sore throat, no nodules palpated in neck, no dysphagia, no odynophagia, no hoarseness Cardiovascular: no CP/no SOB/no palpitations/no leg swelling Respiratory: no cough/no SOB/no wheezing Gastrointestinal: no N/no V/no D/no C/no acid reflux Musculoskeletal: no muscle aches/no joint aches Skin: no rashes, no hair loss Neurological: no tremors/+ numbness and tingling/no  dizziness  I reviewed pt's medications, allergies, PMH, social hx, family hx, and changes were documented in the history of present illness. Otherwise, unchanged from my initial visit note.  Past Medical History:  Diagnosis Date  . Benign paroxysmal vertigo   . Cervicalgia   . Depression   . Diabetes ([place])    Type1  . Diabetes mellitus without complication ([place])   . GERD (gastroesophageal reflux disease)   . Hiatal hernia   . History of cardiac catheterization    Normal coronary arteries 2016  . History of TIA (transient ischemic attack)   . Hyperlipidemia   . Hypertension   . IBS (irritable bowel syndrome)   . Migraine headache   . Panic disorder with agoraphobia   . Peripheral neuropathy   . Subungual hematoma of digit of hand   . Tendonitis   . Vertigo    Past Surgical History:  Procedure Laterality Date  . ABDOMINAL HYSTERECTOMY    . APPENDECTOMY    . CARDIAC CATHETERIZATION   last in 2009   x 4, normal coronary arteries  . CARDIAC CATHETERIZATION N/A 03/26/2015   Procedure: Left Heart Cath and Coronary Angiography;  Surgeon: Wellington Hampshire, MD;  Location: [place] [facility];  Service: Cardiovascular;  Laterality: N/A;  . CARPAL TUNNEL RELEASE Right 08/24/2018   Procedure: CARPAL TUNNEL RELEASE;  Surgeon: Carole Civil, MD;  Location: [facility];  Service: Orthopedics;  Laterality: Right;  . CESAREAN SECTION    . CHOLECYSTECTOMY    . COLONOSCOPY WITH PROPOFOL N/A 01/09/2018   Procedure: COLONOSCOPY WITH PROPOFOL;  Surgeon: Rogene Houston, MD;  [facility];  Service: Endoscopy;  Laterality: N/A;  . CYST EXCISION     right breast  . ESOPHAGOGASTRODUODENOSCOPY (EGD) WITH PROPOFOL N/A 01/09/2018   Procedure: ESOPHAGOGASTRODUODENOSCOPY (EGD) WITH PROPOFOL;  Surgeon: Rogene Houston, MD;  [facility];  Service: Endoscopy;  Laterality: N/A;  . 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 TUBAL LIGATION     History   Social History  . Marital Status: Divorced    Spouse Name: N/A  . Number of Children: 1   Occupational History  . disabled   Social History Main Topics  . Smoking status: Current Every Day Smoker -- 0.50 packs/day for 11 years  . Smokeless tobacco: Never Used     Comment: patient is aware that she needs to quit smoking  . Alcohol Use: No  . Drug Use: No   Current Outpatient Medications on File Prior to Visit  Medication Sig Dispense Refill  . aspirin EC 81 MG tablet  Take 1 tablet (81 mg total) by mouth daily. 90 tablet 3  . Calcium Polycarbophil (FIBERCON PO) Take 1,000 mg by mouth daily.     . cholecalciferol (VITAMIN D3) 25 MCG (1000 UT) tablet Take 1,000 Units by mouth daily.    . Continuous Blood Gluc Sensor (FREESTYLE LIBRE 14 DAY SENSOR) MISC 1 each by Does not apply route every 14 (fourteen) days. 2 each 2  . diazepam (VALIUM) 5 MG tablet Take 5 mg by mouth every 8 (eight) hours as needed for anxiety.    . dicyclomine (BENTYL) 10 MG capsule Take 1 capsule (10 mg total) by mouth 3 (three) times daily as needed (abd pain). 30 capsule 1  . dimenhyDRINATE (DRAMAMINE) 50 MG tablet Take 50 mg by mouth every 8 (eight) hours as needed for nausea.     Marland Kitchen docusate sodium (COLACE) 100 MG capsule Take 100-200 mg by mouth See admin instructions. Take 100 mg by mouth in the morning and 200 mg in the evening    . EPIPEN 2-PAK 0.3 MG/0.3ML SOAJ injection Inject 0.3 mg into the muscle once.     Marland Kitchen estradiol (ESTRACE) 1 MG tablet Take 1 mg by mouth daily.    Marland Kitchen GLUCAGEN HYPOKIT 1 MG SOLR injection Inject 1 mg into the skin once as needed for low blood sugar.     . ibuprofen (ADVIL,MOTRIN) 800 MG tablet Take 800 mg by mouth every 8 (eight) hours as needed for moderate pain.    Marland Kitchen insulin aspart (NOVOLOG) 100 UNIT/ML injection Inject 30-40  Units into the skin daily. Inject 30-40 units via pump daily (Patient taking differently: Inject 30-40 Units into the skin daily. Inject 30-40 units daily)    . LANTUS SOLOSTAR 100 UNIT/ML Solostar Pen Inject 25 units in am and 10 units at night 30 mL 3  . linaclotide (LINZESS) 72 MCG capsule Take 1 capsule (72 mcg total) by mouth daily before breakfast. 30 capsule 3  . lisinopril (PRINIVIL,ZESTRIL) 5 MG tablet Take 5 mg by mouth daily.  2  . LYRICA 200 MG capsule Take 200 mg by mouth 2 (two) times daily.     . Melatonin 5 MG TABS Take 10 mg by mouth at bedtime as needed (sleep).     . Multiple Vitamin (MULTIVITAMIN WITH MINERALS) TABS tablet Take 1 tablet by mouth daily.    Marland Kitchen omega-3 fish oil (MAXEPA) 1000 MG CAPS capsule Take 1 capsule by mouth daily.     . pravastatin (PRAVACHOL) 10 MG tablet Take 10 mg by mouth at bedtime.  2  . tiZANidine (ZANAFLEX) 4 MG tablet Take 4 mg by mouth every 8 (eight) hours as needed for muscle spasms.     . vitamin B-12 (CYANOCOBALAMIN) 1000 MCG tablet Take 2,000 mcg by mouth daily.    . DULoxetine (CYMBALTA) 30 MG capsule One tablet a day for 1 week, then take one twice a day (Patient not taking: Reported on 08/27/2019) 60 capsule 3   No current facility-administered medications on file prior to visit.   Allergies  Allergen Reactions  . Zocor [Simvastatin] Other (See Comments)    Muscle aches and pains  . Clindamycin/Lincomycin Rash  . Codeine Rash  . Penicillins Rash    Has patient had a PCN reaction causing immediate rash, facial/tongue/throat swelling, SOB or lightheadedness with hypotension: Yes Has patient had a PCN reaction causing severe rash involving mucus membranes or skin necrosis: Yes Has patient had a PCN reaction that required hospitalization: Yes Has patient had a  PCN reaction occurring within the last 10 years: No If all of the above answers are "NO", then may proceed with Cephalosporin use.   . Sulfa Antibiotics Rash        Family  History  Problem Relation Age of Onset  . Stroke Mother 75  . Heart attack Mother 10  . Cancer Sister        colon  . Heart failure Maternal Grandmother 65  . Cancer Maternal Grandfather        prostate  . Heart failure Paternal Grandmother 3  . Heart failure Paternal Grandfather 60   PE: BP 122/70   Pulse 87   Ht 5\' 7"  (1.702 m)   Wt 158 lb (71.7 kg)   SpO2 97%   BMI 24.75 kg/m  Body mass index is 24.75 kg/m. Wt Readings from Last 3 Encounters:  08/31/19 158 lb (71.7 kg)  08/27/19 158 lb 6.4 oz (71.8 kg)  07/16/19 157 lb (71.2 kg)   Constitutional: normal weight, in NAD Eyes: PERRLA, EOMI, no exophthalmos ENT: moist mucous membranes, no thyromegaly, no cervical lymphadenopathy Cardiovascular: RRR, No MRG Respiratory: CTA B Gastrointestinal: abdomen soft, NT, ND, BS+ Musculoskeletal: no deformities, strength intact in all 4, right wrist in cast (after dental surgery) Skin: moist, warm, no rashes Neurological: no tremor with outstretched hands, DTR normal in all 4  ASSESSMENT: 1. DM1, insulin-dependent, uncontrolled, with complications - cerebro-vascular ds - h/o TIA 0/2016 - PN - DR Sees cardiology >> Dr. Maurice Small - [facility]  2.  High vitamin B12  PLAN:  1. Patient with longstanding, uncontrolled, type 1 diabetes, previously on an antibiotic insulin pump, then on chronic 670 G insulin pump but without the CGM as this was expensive.  However, she was able to get the freestyle libre CGM, which she uses now.  She had to come off her pump after an episode of DKA in 09/2018 and suspicion of a stroke (however, this was ruled out by MRI.  She also had carotid Dopplers afterwards and these were normal). -Her HbA1c levels increased after stopping the pump and she decided to go back but this time on the tandem t:slim pump with Dexcom CGM.  Her insurance approved these and she is waiting for the pump.  She feels that she may be able to obtain it in the next week. -At  this visit, sugars are very high at all times of the day with occasional low blood sugars when she overcorrect a high blood sugar.  However, she is not bolusing correctly, waiting to bolus for the meal only after the sugars are already high.  I advised her that it would be important to bolus 15 minutes before the meal, or, if low, at the start of the meal.  However, I feel that her doses are not quite enough so we will increase the NovoLog and also her Lantus dose at night -We also discussed about the new insulin pump and differences between the t:slim and Medtronic pumps.  I also gave her a set of insulin settings use with the new pump, as a starting point. - I suggested to:  Patient Instructions  Please continue: - Lantus 25 units in the morning and 15 units at night - Novolog: -10-12 units before a breakfast -4 units before lunch, if you have a small meal -14-18 units before dinner - Sliding scale Novolog:  150-180: + 1 unit 181-210: + 2 units 211-240: + 3 units 241-270: + 4 units 270-300: + 5 units >  300: + 6 units  When you change to the insulin pump, start with the following settings: - basal rates: 12 am: 1 unit/h - ICR:   12 am: 1:8 - target: 130-130 - ISF: 35 - Insulin on Board: 3h  Please return in 1.5 months.   - we checked her HbA1c: 10.2% (higher)  - advised to check sugars at different times of the day - 4x a day, rotating check times - advised for yearly eye exams >> she is UTD - return to clinic in 1.5 months   2.  High vitamin B12 -We checked a B12 vitamin level for her in the past due to complains about numbness and tingling in the fingers. -We started supplementation >> at last check the B12 level was high and I advised we decreased the dose. -We will recheck this at next visit  Philemon Kingdom, MD PhD [facility] Endocr[facility]

## 2019-09-04 ENCOUNTER — Telehealth (INDEPENDENT_AMBULATORY_CARE_PROVIDER_SITE_OTHER): Payer: Self-pay | Admitting: Gastroenterology

## 2019-09-04 NOTE — Telephone Encounter (Signed)
[name] - per Dr Laural Golden recommendation given her history of poor prep she will need 2 day colonoscopy prep - please make a note on her white order form. Thanks.

## 2019-09-04 NOTE — Telephone Encounter (Signed)
noted 

## 2019-09-10 ENCOUNTER — Encounter (INDEPENDENT_AMBULATORY_CARE_PROVIDER_SITE_OTHER): Payer: Self-pay

## 2019-09-10 ENCOUNTER — Telehealth (INDEPENDENT_AMBULATORY_CARE_PROVIDER_SITE_OTHER): Payer: Self-pay | Admitting: *Deleted

## 2019-09-10 ENCOUNTER — Other Ambulatory Visit (INDEPENDENT_AMBULATORY_CARE_PROVIDER_SITE_OTHER): Payer: Self-pay | Admitting: *Deleted

## 2019-09-10 ENCOUNTER — Encounter (INDEPENDENT_AMBULATORY_CARE_PROVIDER_SITE_OTHER): Payer: Self-pay | Admitting: *Deleted

## 2019-09-10 ENCOUNTER — Other Ambulatory Visit: Payer: Self-pay | Admitting: Internal Medicine

## 2019-09-10 DIAGNOSIS — R194 Change in bowel habit: Secondary | ICD-10-CM

## 2019-09-10 DIAGNOSIS — K625 Hemorrhage of anus and rectum: Secondary | ICD-10-CM

## 2019-09-10 MED ORDER — SUPREP BOWEL PREP KIT 17.5-3.13-1.6 GM/177ML PO SOLN: 1.0000 | Freq: Once | ORAL | 0 refills | Status: AC

## 2019-09-10 NOTE — Telephone Encounter (Signed)
Patient needs suprep TCS sch'd 2/19

## 2019-09-17 ENCOUNTER — Encounter: Payer: Self-pay | Admitting: Internal Medicine

## 2019-09-17 ENCOUNTER — Telehealth: Payer: Self-pay | Admitting: Internal Medicine

## 2019-09-17 ENCOUNTER — Telehealth: Payer: Self-pay

## 2019-09-17 NOTE — Telephone Encounter (Signed)
Patient called and is wondering how much medication she needs to be taking prior to her colonoscopy. Patient ph# [phone].

## 2019-09-17 NOTE — Telephone Encounter (Signed)
It has already been faxed.

## 2019-09-17 NOTE — Telephone Encounter (Signed)
Forms for pump supplies filled out, signed by Dr. Cruzita Lederer and faxed to [facility] with confirmation.

## 2019-09-17 NOTE — Telephone Encounter (Signed)
[facility] called to get an update on fax they sent requesting recent chart notes, etc for CMN  Their fax # is [phone]  Ref number is  [id number]

## 2019-09-18 ENCOUNTER — Telehealth: Payer: Self-pay | Admitting: Nutrition

## 2019-09-19 ENCOUNTER — Other Ambulatory Visit: Payer: Self-pay

## 2019-09-19 ENCOUNTER — Encounter (HOSPITAL_COMMUNITY)
Admission: RE | Admit: 2019-09-19 | Discharge: 2019-09-19 | Disposition: A | Discharge status: Home / Self Care | Payer: Medicare Other | Source: Ambulatory Visit | Attending: Internal Medicine | Admitting: Internal Medicine

## 2019-09-19 ENCOUNTER — Other Ambulatory Visit (HOSPITAL_COMMUNITY)
Admission: RE | Admit: 2019-09-19 | Discharge: 2019-09-19 | Disposition: A | Discharge status: Home / Self Care | Payer: Medicare Other | Source: Ambulatory Visit | Attending: Internal Medicine | Admitting: Internal Medicine

## 2019-09-19 DIAGNOSIS — Z79899 Other long term (current) drug therapy: Secondary | ICD-10-CM | POA: Diagnosis not present

## 2019-09-19 DIAGNOSIS — F329 Major depressive disorder, single episode, unspecified: Secondary | ICD-10-CM | POA: Diagnosis not present

## 2019-09-19 DIAGNOSIS — F17210 Nicotine dependence, cigarettes, uncomplicated: Secondary | ICD-10-CM | POA: Diagnosis not present

## 2019-09-19 DIAGNOSIS — K921 Melena: Secondary | ICD-10-CM | POA: Diagnosis present

## 2019-09-19 DIAGNOSIS — Z880 Allergy status to penicillin: Secondary | ICD-10-CM | POA: Diagnosis not present

## 2019-09-19 DIAGNOSIS — K5909 Other constipation: Secondary | ICD-10-CM | POA: Diagnosis not present

## 2019-09-19 DIAGNOSIS — D122 Benign neoplasm of ascending colon: Secondary | ICD-10-CM | POA: Diagnosis not present

## 2019-09-19 DIAGNOSIS — Z794 Long term (current) use of insulin: Secondary | ICD-10-CM | POA: Diagnosis not present

## 2019-09-19 DIAGNOSIS — I10 Essential (primary) hypertension: Secondary | ICD-10-CM | POA: Diagnosis not present

## 2019-09-19 DIAGNOSIS — Z885 Allergy status to narcotic agent status: Secondary | ICD-10-CM | POA: Diagnosis not present

## 2019-09-19 DIAGNOSIS — Z888 Allergy status to other drugs, medicaments and biological substances status: Secondary | ICD-10-CM | POA: Diagnosis not present

## 2019-09-19 DIAGNOSIS — K589 Irritable bowel syndrome without diarrhea: Secondary | ICD-10-CM | POA: Diagnosis not present

## 2019-09-19 DIAGNOSIS — Z20822 Contact with and (suspected) exposure to covid-19: Secondary | ICD-10-CM | POA: Diagnosis not present

## 2019-09-19 DIAGNOSIS — K644 Residual hemorrhoidal skin tags: Secondary | ICD-10-CM | POA: Diagnosis not present

## 2019-09-19 DIAGNOSIS — Z881 Allergy status to other antibiotic agents status: Secondary | ICD-10-CM | POA: Diagnosis not present

## 2019-09-19 DIAGNOSIS — E785 Hyperlipidemia, unspecified: Secondary | ICD-10-CM | POA: Diagnosis not present

## 2019-09-19 DIAGNOSIS — Z882 Allergy status to sulfonamides status: Secondary | ICD-10-CM | POA: Diagnosis not present

## 2019-09-19 DIAGNOSIS — Z800 Family history of malignant neoplasm of digestive organs: Secondary | ICD-10-CM | POA: Diagnosis not present

## 2019-09-19 DIAGNOSIS — E1042 Type 1 diabetes mellitus with diabetic polyneuropathy: Secondary | ICD-10-CM | POA: Diagnosis not present

## 2019-09-19 DIAGNOSIS — Z7982 Long term (current) use of aspirin: Secondary | ICD-10-CM | POA: Diagnosis not present

## 2019-09-19 DIAGNOSIS — Z8673 Personal history of transient ischemic attack (TIA), and cerebral infarction without residual deficits: Secondary | ICD-10-CM | POA: Diagnosis not present

## 2019-09-19 LAB — SARS CORONAVIRUS 2 (TAT 6-24 HRS): SARS Coronavirus 2: NEGATIVE

## 2019-09-21 ENCOUNTER — Ambulatory Visit (HOSPITAL_COMMUNITY)
Admission: RE | Admit: 2019-09-21 | Discharge: 2019-09-21 | Disposition: A | Discharge status: Home / Self Care | Payer: Medicare Other | Attending: Internal Medicine | Admitting: Internal Medicine

## 2019-09-21 ENCOUNTER — Other Ambulatory Visit: Payer: Self-pay

## 2019-09-21 ENCOUNTER — Ambulatory Visit (HOSPITAL_COMMUNITY): Payer: Medicare Other | Admitting: Anesthesiology

## 2019-09-21 ENCOUNTER — Encounter (HOSPITAL_COMMUNITY): Payer: Self-pay | Admitting: Internal Medicine

## 2019-09-21 ENCOUNTER — Encounter (HOSPITAL_COMMUNITY)
Admission: RE | Disposition: A | Discharge status: Home / Self Care | Payer: Self-pay | Source: Home / Self Care | Attending: Internal Medicine

## 2019-09-21 DIAGNOSIS — I10 Essential (primary) hypertension: Secondary | ICD-10-CM | POA: Insufficient documentation

## 2019-09-21 DIAGNOSIS — K5909 Other constipation: Secondary | ICD-10-CM | POA: Insufficient documentation

## 2019-09-21 DIAGNOSIS — Z882 Allergy status to sulfonamides status: Secondary | ICD-10-CM | POA: Insufficient documentation

## 2019-09-21 DIAGNOSIS — K921 Melena: Secondary | ICD-10-CM | POA: Insufficient documentation

## 2019-09-21 DIAGNOSIS — D122 Benign neoplasm of ascending colon: Secondary | ICD-10-CM | POA: Diagnosis not present

## 2019-09-21 DIAGNOSIS — Z7982 Long term (current) use of aspirin: Secondary | ICD-10-CM | POA: Insufficient documentation

## 2019-09-21 DIAGNOSIS — E1042 Type 1 diabetes mellitus with diabetic polyneuropathy: Secondary | ICD-10-CM | POA: Insufficient documentation

## 2019-09-21 DIAGNOSIS — Z20822 Contact with and (suspected) exposure to covid-19: Secondary | ICD-10-CM | POA: Diagnosis not present

## 2019-09-21 DIAGNOSIS — Z888 Allergy status to other drugs, medicaments and biological substances status: Secondary | ICD-10-CM | POA: Insufficient documentation

## 2019-09-21 DIAGNOSIS — F17210 Nicotine dependence, cigarettes, uncomplicated: Secondary | ICD-10-CM | POA: Insufficient documentation

## 2019-09-21 DIAGNOSIS — Z79899 Other long term (current) drug therapy: Secondary | ICD-10-CM | POA: Insufficient documentation

## 2019-09-21 DIAGNOSIS — Z794 Long term (current) use of insulin: Secondary | ICD-10-CM | POA: Insufficient documentation

## 2019-09-21 DIAGNOSIS — Z881 Allergy status to other antibiotic agents status: Secondary | ICD-10-CM | POA: Insufficient documentation

## 2019-09-21 DIAGNOSIS — R194 Change in bowel habit: Secondary | ICD-10-CM

## 2019-09-21 DIAGNOSIS — D120 Benign neoplasm of cecum: Secondary | ICD-10-CM | POA: Diagnosis not present

## 2019-09-21 DIAGNOSIS — K644 Residual hemorrhoidal skin tags: Secondary | ICD-10-CM | POA: Diagnosis not present

## 2019-09-21 DIAGNOSIS — F329 Major depressive disorder, single episode, unspecified: Secondary | ICD-10-CM | POA: Insufficient documentation

## 2019-09-21 DIAGNOSIS — E785 Hyperlipidemia, unspecified: Secondary | ICD-10-CM | POA: Insufficient documentation

## 2019-09-21 DIAGNOSIS — K589 Irritable bowel syndrome without diarrhea: Secondary | ICD-10-CM | POA: Insufficient documentation

## 2019-09-21 DIAGNOSIS — Z885 Allergy status to narcotic agent status: Secondary | ICD-10-CM | POA: Insufficient documentation

## 2019-09-21 DIAGNOSIS — Z8673 Personal history of transient ischemic attack (TIA), and cerebral infarction without residual deficits: Secondary | ICD-10-CM | POA: Insufficient documentation

## 2019-09-21 DIAGNOSIS — Z800 Family history of malignant neoplasm of digestive organs: Secondary | ICD-10-CM | POA: Insufficient documentation

## 2019-09-21 DIAGNOSIS — K625 Hemorrhage of anus and rectum: Secondary | ICD-10-CM

## 2019-09-21 DIAGNOSIS — Z880 Allergy status to penicillin: Secondary | ICD-10-CM | POA: Insufficient documentation

## 2019-09-21 HISTORY — PX: POLYPECTOMY: SHX5525

## 2019-09-21 HISTORY — PX: COLONOSCOPY WITH PROPOFOL: SHX5780

## 2019-09-21 LAB — GLUCOSE, CAPILLARY
Glucose-Capillary: 317 mg/dL — ABNORMAL HIGH (ref 70–99)
Glucose-Capillary: 288 mg/dL — ABNORMAL HIGH (ref 70–99)

## 2019-09-21 SURGERY — COLONOSCOPY WITH PROPOFOL
Anesthesia: General

## 2019-09-21 MED ORDER — PROPOFOL 10 MG/ML IV BOLUS
INTRAVENOUS | Status: AC
  Filled 2019-09-21: qty 20

## 2019-09-21 MED ORDER — PHENYLEPHRINE HCL (PRESSORS) 10 MG/ML IV SOLN
INTRAVENOUS | Status: DC | PRN
  Administered 2019-09-21: 40 ug via INTRAVENOUS

## 2019-09-21 MED ORDER — PROMETHAZINE HCL 25 MG/ML IJ SOLN: 6.2500 mg | INTRAMUSCULAR | Status: DC | PRN

## 2019-09-21 MED ORDER — KETAMINE HCL 10 MG/ML IJ SOLN
INTRAMUSCULAR | Status: DC | PRN
  Administered 2019-09-21: 10 mg via INTRAVENOUS
  Administered 2019-09-21: 20 mg via INTRAVENOUS

## 2019-09-21 MED ORDER — STERILE WATER FOR IRRIGATION IR SOLN
Status: DC | PRN
  Administered 2019-09-21: 2.5 mL

## 2019-09-21 MED ORDER — KETAMINE HCL 50 MG/5ML IJ SOSY
PREFILLED_SYRINGE | INTRAMUSCULAR | Status: AC
  Filled 2019-09-21: qty 5

## 2019-09-21 MED ORDER — CHLORHEXIDINE GLUCONATE CLOTH 2 % EX PADS: 6.0000 | MEDICATED_PAD | Freq: Once | CUTANEOUS | Status: DC

## 2019-09-21 MED ORDER — INSULIN ASPART 100 UNIT/ML ~~LOC~~ SOLN
5.0000 [IU] | Freq: Once | SUBCUTANEOUS | Status: AC
  Administered 2019-09-21: 5 [IU] via SUBCUTANEOUS
  Filled 2019-09-21: qty 0.05

## 2019-09-21 MED ORDER — LINACLOTIDE 145 MCG PO CAPS: 145.0000 ug | ORAL_CAPSULE | Freq: Every day | ORAL | 5 refills | Status: DC

## 2019-09-21 MED ORDER — LIDOCAINE 2% (20 MG/ML) 5 ML SYRINGE
INTRAMUSCULAR | Status: AC
  Filled 2019-09-21: qty 5

## 2019-09-21 MED ORDER — PHENYLEPHRINE 40 MCG/ML (10ML) SYRINGE FOR IV PUSH (FOR BLOOD PRESSURE SUPPORT)
PREFILLED_SYRINGE | INTRAVENOUS | Status: AC
  Filled 2019-09-21: qty 10

## 2019-09-21 MED ORDER — MIDAZOLAM HCL 2 MG/2ML IJ SOLN: 0.5000 mg | Freq: Once | INTRAMUSCULAR | Status: DC | PRN

## 2019-09-21 MED ORDER — LACTATED RINGERS IV SOLN: INTRAVENOUS | Status: DC

## 2019-09-21 MED ORDER — PROPOFOL 10 MG/ML IV BOLUS
INTRAVENOUS | Status: DC | PRN
  Administered 2019-09-21 (×6): 20 mg via INTRAVENOUS

## 2019-09-21 NOTE — H&P (Signed)
[name] is an 49 y.o. female.   Chief Complaint: Patient is here for colonoscopy. HPI: Patient is 49 year old Caucasian female who presents with recurrent hematochezia.  She has chronic constipation.  She bleeds a couple of times a week and it always occurs with a bowel movement.  She says she was taking Linzess at a low dose and was helping but not anymore.  She says she has lost few pounds involuntarily.  She denies abdominal pain nausea or vomiting. Last colonoscopy was in June 2019 and was incomplete because of poor prep.  Colonoscopy in November 23 at [facility] was normal. Family history significant for colon carcinoma in her sister who died in her 16s with an 32 months of diagnosis. Her father has Crohn's disease   Past Medical History:  Diagnosis Date  . Benign paroxysmal vertigo   . Cervicalgia   . Depression   . Diabetes ([place])    Type1  . Diabetes mellitus without complication ([place])   . GERD (gastroesophageal reflux disease)   . Hiatal hernia   . History of cardiac catheterization    Normal coronary arteries 2016  . History of TIA (transient ischemic attack)   . Hyperlipidemia   . Hypertension   . IBS (irritable bowel syndrome)   . Migraine headache   . Panic disorder with agoraphobia   . Peripheral neuropathy   . Subungual hematoma of digit of hand   . Tendonitis   . Vertigo     Past Surgical History:  Procedure Laterality Date  . ABDOMINAL HYSTERECTOMY    . APPENDECTOMY    . CARDIAC CATHETERIZATION   last in 2009   x 4, normal coronary arteries  . CARDIAC CATHETERIZATION N/A 03/26/2015   Procedure: Left Heart Cath and Coronary Angiography;  Surgeon: Wellington Hampshire, MD;  Location: [place] [facility];  Service: Cardiovascular;  Laterality: N/A;  . CARPAL TUNNEL RELEASE Right 08/24/2018   Procedure: CARPAL TUNNEL RELEASE;  Surgeon: Carole Civil, MD;  [facility];  Service: Orthopedics;  Laterality: Right;  . CESAREAN SECTION     . CHOLECYSTECTOMY    . COLONOSCOPY WITH PROPOFOL N/A 01/09/2018   Procedure: COLONOSCOPY WITH PROPOFOL;  Surgeon: Rogene Houston, MD;  [facility];  Service: Endoscopy;  Laterality: N/A;  . CYST EXCISION     right breast  . ESOPHAGOGASTRODUODENOSCOPY (EGD) WITH PROPOFOL N/A 01/09/2018   Procedure: ESOPHAGOGASTRODUODENOSCOPY (EGD) WITH PROPOFOL;  Surgeon: Rogene Houston, MD;  [facility];  Service: Endoscopy;  Laterality: N/A;  . 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 TUBAL LIGATION      Family History  Problem Relation Age of Onset  . Stroke Mother 37  . Heart attack Mother 93  . Cancer Sister        colon  . Heart failure Maternal Grandmother 65  . Cancer Maternal Grandfather        prostate  . Heart failure Paternal Grandmother 40  . Heart failure Paternal Grandfather 46   Social History:  reports that she has been smoking cigarettes. She has a 5.50 pack-year smoking history. She has never used smokeless tobacco. She reports current alcohol use. She reports that she does not use drugs.  Allergies:  Allergies  Allergen Reactions  . Bee Venom  Anaphylaxis and Hives  . Zocor [Simvastatin] Other (See Comments)    Muscle aches and pains  . Clindamycin/Lincomycin Rash  . Codeine Rash  . Penicillins Rash    Did it involve swelling of the face/tongue/throat, SOB, or low BP? No Did it involve sudden or severe rash/hives, skin peeling, or any reaction on the inside of your mouth or nose? Yes Did you need to seek medical attention at a hospital or doctor's office? Yes When did it last happen?30 years If all above answers are "NO", may proceed with cephalosporin use.   . Sulfa Antibiotics Rash         Medications Prior to Admission  Medication Sig  Dispense Refill  . diazepam (VALIUM) 5 MG tablet Take 5 mg by mouth every 8 (eight) hours as needed for anxiety.    . dicyclomine (BENTYL) 10 MG capsule Take 1 capsule (10 mg total) by mouth 3 (three) times daily as needed (abd pain). 30 capsule 1  . docusate sodium (COLACE) 100 MG capsule Take 100-200 mg by mouth See admin instructions. Take 100 mg by mouth in the morning and 200 mg in the evening    . EPIPEN 2-PAK 0.3 MG/0.3ML SOAJ injection Inject 0.3 mg into the muscle as needed for anaphylaxis.     Marland Kitchen estradiol (ESTRACE) 1 MG tablet Take 1 mg by mouth daily.    Marland Kitchen GLUCAGEN HYPOKIT 1 MG SOLR injection Inject 1 mg into the skin once as needed for low blood sugar.     . ibuprofen (ADVIL) 400 MG tablet Take 400 mg by mouth 2 (two) times daily as needed for headache or moderate pain.     Marland Kitchen insulin aspart (NOVOLOG FLEXPEN) 100 UNIT/ML FlexPen Inject 10-18 Units into the skin 3 (three) times daily with meals. (Patient taking differently: Inject 10-15 Units into the skin See admin instructions. Inject 10 to 15 units with each meal, may inject 10 to 15 units when eatings snacks) 15 mL 12  . LANTUS SOLOSTAR 100 UNIT/ML Solostar Pen Inject 25 units in am and 10 units at night (Patient taking differently: Inject 10-25 Units into the skin See admin instructions. Inject 25 units in am and 10 units at night) 30 mL 3  . linaclotide (LINZESS) 72 MCG capsule Take 1 capsule (72 mcg total) by mouth daily before breakfast. 30 capsule 3  . lisinopril (PRINIVIL,ZESTRIL) 5 MG tablet Take 5 mg by mouth daily.  2  . Melatonin 5 MG TABS Take 10 mg by mouth at bedtime as needed (sleep).     . naproxen (NAPROSYN) 500 MG tablet Take 500 mg by mouth 2 (two) times daily as needed (TMJ symptoms).     . Omega-3 Fatty Acids (FISH OIL) 1000 MG CAPS Take 1,000 mg by mouth daily.    . polycarbophil (FIBERCON) 625 MG tablet Take 625 mg by mouth daily.    . pravastatin (PRAVACHOL) 10 MG tablet Take 10 mg by mouth at bedtime.  2  .  pregabalin (LYRICA) 300 MG capsule Take 300 mg by mouth 2 (two) times daily.    . promethazine (PHENERGAN) 25 MG tablet Take 25 mg by mouth every 6 (six) hours as needed for nausea/vomiting.    Marland Kitchen tiZANidine (ZANAFLEX) 4 MG tablet Take 4 mg by mouth every 8 (eight) hours as needed for muscle spasms.     Marland Kitchen ascorbic acid (VITAMIN C) 500 MG tablet Take 500 mg by mouth daily.    Marland Kitchen aspirin EC 81 MG tablet Take 1  tablet (81 mg total) by mouth daily. 90 tablet 3  . cholecalciferol (VITAMIN D3) 25 MCG (1000 UT) tablet Take 1,000 Units by mouth daily.    . Continuous Blood Gluc Sensor (FREESTYLE LIBRE 14 DAY SENSOR) MISC 1 each by Does not apply route every 14 (fourteen) days. 2 each 2  . DULoxetine (CYMBALTA) 30 MG capsule One tablet a day for 1 week, then take one twice a day (Patient not taking: Reported on 08/27/2019) 60 capsule 3  . vitamin B-12 (CYANOCOBALAMIN) 1000 MCG tablet Take 2,000 mcg by mouth daily.      Results for orders placed or performed during the hospital encounter of 09/21/19 (from the past 48 hour(s))  Glucose, capillary     Status: Abnormal   Collection Time: 09/21/19 10:00 AM  Result Value Ref Range   Glucose-Capillary 317 (H) 70 - 99 mg/dL   No results found.  Review of Systems  Blood pressure 98/66, pulse 78, temperature 98.5 F (36.9 C), temperature source Oral, resp. rate 18, height 5\' 7"  (1.702 m), weight 70.3 kg, SpO2 93 %. Physical Exam  Constitutional: She appears well-developed and well-nourished.  HENT:  Mouth/Throat: Oropharynx is clear and moist.  Eyes: Conjunctivae are normal. No scleral icterus.  Neck: No thyromegaly present.  Cardiovascular: Normal rate, regular rhythm and normal heart sounds.  No murmur heard. Respiratory: Effort normal and breath sounds normal.  GI:  Abdomen is symmetrical.  She has appendectomy and Pfannenstiel scars.  Abdomen is soft and nontender with organomegaly or masses.  Musculoskeletal:        General: No edema.   Lymphadenopathy:    She has no cervical adenopathy.  Neurological: She is alert.  Skin: Skin is warm and dry.     Assessment/Plan Hematochezia. Family history of CRC in first-degree relative at young age. Diagnostic colonoscopy.  Hildred Laser, MD 09/21/2019, 10:24 AM

## 2019-09-21 NOTE — Anesthesia Preprocedure Evaluation (Signed)
Anesthesia Evaluation  Patient identified by MRN, date of birth, ID band Patient awake    Reviewed: Allergy & Precautions, NPO status , Patient's Chart, lab work & pertinent test results  Airway Mallampati: II  TM Distance: >3 FB Neck ROM: Full    Dental no notable dental hx. (+) Teeth Intact   Pulmonary neg pulmonary ROS, Current Smoker and Patient abstained from smoking.,    Pulmonary exam normal breath sounds clear to auscultation       Cardiovascular Exercise Tolerance: Good hypertension, Pt. on medications negative cardio ROS Normal cardiovascular examI Rhythm:Regular Rate:Normal     Neuro/Psych  Headaches, Anxiety Depression TIA Neuromuscular disease negative psych ROS   GI/Hepatic Neg liver ROS, hiatal hernia, Bowel prep,GERD  Medicated and Controlled,  Endo/Other  negative endocrine ROSdiabetes, Poorly Controlled, Type 1, Insulin Dependent  Renal/GU negative Renal ROS  negative genitourinary   Musculoskeletal negative musculoskeletal ROS (+)   Abdominal   Peds negative pediatric ROS (+)  Hematology negative hematology ROS (+)   Anesthesia Other Findings   Reproductive/Obstetrics negative OB ROS                             Anesthesia Physical Anesthesia Plan  ASA: III  Anesthesia Plan: General   Post-op Pain Management:    Induction: Intravenous  PONV Risk Score and Plan: 2 and TIVA, Propofol infusion and Treatment may vary due to age or medical condition  Airway Management Planned: Nasal Cannula and Simple Face Mask  Additional Equipment:   Intra-op Plan:   Post-operative Plan:   Informed Consent: I have reviewed the patients History and Physical, chart, labs and discussed the procedure including the risks, benefits and alternatives for the proposed anesthesia with the patient or authorized representative who has indicated his/her understanding and acceptance.      Dental advisory given  Plan Discussed with: CRNA  Anesthesia Plan Comments: (Plan Full PPE use  Plan GA with GETA as needed d/w pt -WTP with same after Q&A)        Anesthesia Quick Evaluation

## 2019-09-21 NOTE — Transfer of Care (Signed)
Immediate Anesthesia Transfer of Care Note  Patient: [name]  Procedure(s) Performed: COLONOSCOPY WITH PROPOFOL (N/A ) POLYPECTOMY  Patient Location: PACU  Anesthesia Type:MAC  Level of Consciousness: awake, alert , oriented and patient cooperative  Airway & Oxygen Therapy: Patient Spontanous Breathing  Post-op Assessment: Report given to RN, Post -op Vital signs reviewed and stable and Patient moving all extremities X 4  Post vital signs: Reviewed and stable  Last Vitals:  Vitals Value Taken Time  BP 93/70   Temp    Pulse 73 09/21/19 1105  Resp 18 09/21/19 1105  SpO2 97 % 09/21/19 1105  Vitals shown include unvalidated device data.  Last Pain:  Vitals:   09/21/19 0948  TempSrc: Oral  PainSc: 3       Patients Stated Pain Goal: 5 (41/93/79 0240)  Complications: No apparent anesthesia complications

## 2019-09-21 NOTE — Op Note (Signed)
[facility] Patient Name: [name] Procedure Date: 09/21/2019 10:13 AM MRN: [id number] Date of Birth: 04/19/1971 Attending MD: Hildred Laser , MD CSN: [id number] Age: 49 Admit Type: Outpatient Procedure:                Colonoscopy Indications:              Hematochezia Providers:                Hildred Laser, MD, Lurline Del, RN, Raphael Gibney,                            Technician Referring MD:             Denny Levy, PA Medicines:                Propofol per Anesthesia Complications:            No immediate complications. Estimated Blood Loss:     Estimated blood loss was minimal. Procedure:                Pre-Anesthesia Assessment:                           - Prior to the procedure, a History and Physical                            was performed, and patient medications and                            allergies were reviewed. The patient's tolerance of                            previous anesthesia was also reviewed. The risks                            and benefits of the procedure and the sedation                            options and risks were discussed with the patient.                            All questions were answered, and informed consent                            was obtained. Prior Anticoagulants: The patient has                            taken no previous anticoagulant or antiplatelet                            agents except for aspirin and has taken no previous                            anticoagulant or antiplatelet agents except for                            NSAID  medication. ASA Grade Assessment: III - A                            patient with severe systemic disease. After                            reviewing the risks and benefits, the patient was                            deemed in satisfactory condition to undergo the                            procedure.                           After obtaining informed consent, the colonoscope                             was passed under direct vision. Throughout the                            procedure, the patient's blood pressure, pulse, and                            oxygen saturations were monitored continuously. The                            [id number] was introduced through the                            anus and advanced to the the cecum, identified by                            appendiceal orifice and ileocecal valve. The                            colonoscopy was performed without difficulty. The                            patient tolerated the procedure well. The quality                            of the bowel preparation was excellent. The                            ileocecal valve, appendiceal orifice, and rectum                            were photographed. Scope In: 10:40:59 AM Scope Out: 10:58:49 AM Scope Withdrawal Time: 0 hours 10 minutes 22 seconds  Total Procedure Duration: 0 hours 17 minutes 50 seconds  Findings:      The perianal and digital rectal examinations were normal.      A 6 mm polyp was found in the proximal ascending colon. The polyp was  removed with a cold snare. Resection and retrieval were complete.      The exam was otherwise without abnormality.      External hemorrhoids were found during retroflexion. The hemorrhoids       were small. Impression:               - One 6 mm polyp in the proximal ascending colon,                            removed with a cold snare. Resected and retrieved.                           - The examination was otherwise normal.                           - External hemorrhoids. Moderate Sedation:      Per Anesthesia Care Recommendation:           - Patient has a contact number available for                            emergencies. The signs and symptoms of potential                            delayed complications were discussed with the                            patient. Return to normal activities tomorrow.                             Written discharge instructions were provided to the                            patient.                           - High fiber diet and diabetic (ADA) diet today.                           - Continue present medications.                           - No aspirin, ibuprofen, naproxen, or other                            non-steroidal anti-inflammatory drugs for 1 day.                           - Await pathology results.                           - Repeat colonoscopy in 5 years. Procedure Code(s):        --- Professional ---                           [id number], Colonoscopy, flexible; with removal of  tumor(s), polyp(s), or other lesion(s) by snare                            technique Diagnosis Code(s):        --- Professional ---                           K63.5, Polyp of colon                           K64.4, Residual hemorrhoidal skin tags                           K92.1, Melena (includes Hematochezia) CPT copyright 2019 American Medical Association. All rights reserved. The codes documented in this report are preliminary and upon coder review may  be revised to meet current compliance requirements. Hildred Laser, MD Hildred Laser, MD 09/21/2019 11:07:44 AM This report has been signed electronically. Number of Addenda: 0

## 2019-09-21 NOTE — Addendum Note (Signed)
Addendum  created 09/21/19 1248 by Vista Deck, CRNA   Charge Capture section accepted

## 2019-09-21 NOTE — Discharge Instructions (Signed)
No aspirin or NSAIDs for 24 hours. Resume other medications as before.  Keep ibuprofen use to minimum. Linzess 145 mcg by mouth every morning. Modified carb high-fiber diet. No driving for 24 hours. Physician will call with biopsy results.   Colonoscopy, Adult, Care After This sheet gives you information about how to care for yourself after your procedure. Your doctor may also give you more specific instructions. If you have problems or questions, call your doctor. What can I expect after the procedure? After the procedure, it is common to have:  A small amount of blood in your poop (stool) for 24 hours.  Some gas.  Mild cramping or bloating in your belly (abdomen). Follow these instructions at home: Eating and drinking   Drink enough fluid to keep your pee (urine) pale yellow.  Follow instructions from your doctor about what you cannot eat or drink.  Return to your normal diet as told by your doctor. Avoid heavy or fried foods that are hard to digest. Activity  Rest as told by your doctor.  Do not sit for a long time without moving. Get up to take short walks every 1-2 hours. This is important. Ask for help if you feel weak or unsteady.  Return to your normal activities as told by your doctor. Ask your doctor what activities are safe for you. To help cramping and bloating:   Try walking around.  Put heat on your belly as told by your doctor. Use the heat source that your doctor recommends, such as a moist heat pack or a heating pad. ? Put a towel between your skin and the heat source. ? Leave the heat on for 20-30 minutes. ? Remove the heat if your skin turns bright red. This is very important if you are unable to feel pain, heat, or cold. You may have a greater risk of getting burned. General instructions  For the first 24 hours after the procedure: ? Do not drive or use machinery. ? Do not sign important documents. ? Do not drink alcohol. ? Do your daily activities  more slowly than normal. ? Eat foods that are soft and easy to digest.  Take over-the-counter or prescription medicines only as told by your doctor.  Keep all follow-up visits as told by your doctor. This is important. Contact a doctor if:  You have blood in your poop 2-3 days after the procedure. Get help right away if:  You have more than a small amount of blood in your poop.  You see large clumps of tissue (blood clots) in your poop.  Your belly is swollen.  You feel like you may vomit (nauseous).  You vomit.  You have a fever.  You have belly pain that gets worse, and medicine does not help your pain. Summary  After the procedure, it is common to have a small amount of blood in your poop. You may also have mild cramping and bloating in your belly.  For the first 24 hours after the procedure, do not drive or use machinery, do not sign important documents, and do not drink alcohol.  Get help right away if you have a lot of blood in your poop, feel like you may vomit, have a fever, or have more belly pain. This information is not intended to replace advice given to you by your health care provider. Make sure you discuss any questions you have with your health care provider. Document Revised: 02/12/2019 Document Reviewed: 02/12/2019 Elsevier Patient Education  [phone]  [place] After These instructions provide you with information about caring for yourself after your procedure. Your health care provider may also give you more specific instructions. Your treatment has been planned according to current medical practices, but problems sometimes occur. Call your health care provider if you have any problems or questions after your procedure. What can I expect after the procedure? After your procedure, you may:  Feel sleepy for several hours.  Feel clumsy and have poor balance for several hours.  Feel forgetful about what happened after the  procedure.  Have poor judgment for several hours.  Feel nauseous or vomit.  Have a sore throat if you had a breathing tube during the procedure. Follow these instructions at home: For at least 24 hours after the procedure:      Have a responsible adult stay with you. It is important to have someone help care for you until you are awake and alert.  Rest as needed.  Do not: ? Participate in activities in which you could fall or become injured. ? Drive. ? Use heavy machinery. ? Drink alcohol. ? Take sleeping pills or medicines that cause drowsiness. ? Make important decisions or sign legal documents. ? Take care of children on your own. Eating and drinking  Follow the diet that is recommended by your health care provider.  If you vomit, drink water, juice, or soup when you can drink without vomiting.  Make sure you have little or no nausea before eating solid foods. General instructions  Take over-the-counter and prescription medicines only as told by your health care provider.  If you have sleep apnea, surgery and certain medicines can increase your risk for breathing problems. Follow instructions from your health care provider about wearing your sleep device: ? Anytime you are sleeping, including during daytime naps. ? While taking prescription pain medicines, sleeping medicines, or medicines that make you drowsy.  If you smoke, do not smoke without supervision.  Keep all follow-up visits as told by your health care provider. This is important. Contact a health care provider if:  You keep feeling nauseous or you keep vomiting.  You feel light-headed.  You develop a rash.  You have a fever. Get help right away if:  You have trouble breathing. Summary  For several hours after your procedure, you may feel sleepy and have poor judgment.  Have a responsible adult stay with you for at least 24 hours or until you are awake and alert. This information is not  intended to replace advice given to you by your health care provider. Make sure you discuss any questions you have with your health care provider. Document Revised: 10/17/2017 Document Reviewed: 11/09/2015 Elsevier Patient Education  Max.

## 2019-09-21 NOTE — Anesthesia Postprocedure Evaluation (Signed)
Anesthesia Post Note  Patient: [name]  Procedure(s) Performed: COLONOSCOPY WITH PROPOFOL (N/A ) POLYPECTOMY  Patient location during evaluation: Phase II Anesthesia Type: General Level of consciousness: awake and alert and patient cooperative Pain management: satisfactory to patient Vital Signs Assessment: post-procedure vital signs reviewed and stable Respiratory status: spontaneous breathing Cardiovascular status: stable Postop Assessment: no apparent nausea or vomiting Anesthetic complications: no     Last Vitals:  Vitals:   09/21/19 1115 09/21/19 1121  BP: 101/61 (!) 90/59  Pulse: 71 73  Resp: 17 18  Temp:  36.6 C  SpO2: 95%     Last Pain:  Vitals:   09/21/19 1121  TempSrc: Oral  PainSc:                  Drucie Opitz

## 2019-09-24 ENCOUNTER — Telehealth: Payer: Self-pay | Admitting: Nutrition

## 2019-09-24 LAB — SURGICAL PATHOLOGY

## 2019-09-24 NOTE — Telephone Encounter (Signed)
C-Peptide and FBS, and last 2 office notes faxed to [facility].

## 2019-09-25 NOTE — Telephone Encounter (Signed)
I sent no blood sugar readings.

## 2019-09-25 NOTE — Telephone Encounter (Signed)
[facility] called checking status of glucose results. Ph# [phone]

## 2019-09-26 ENCOUNTER — Telehealth: Payer: Self-pay | Admitting: Nutrition

## 2019-09-26 NOTE — Telephone Encounter (Signed)
C-peptide with FBS faxed to [facility] at paitent's request.  Fax confirmation confirmed

## 2019-09-26 NOTE — Telephone Encounter (Signed)
c-peptide and FBS faxed to [facility] at patient's request

## 2019-10-10 ENCOUNTER — Telehealth: Payer: Self-pay | Admitting: Nutrition

## 2019-10-10 ENCOUNTER — Ambulatory Visit: Payer: Medicare HMO | Admitting: Neurology

## 2019-10-10 NOTE — Telephone Encounter (Signed)
Per patient's request, C-peptide and FBS lab results faxed to [facility]

## 2019-10-24 ENCOUNTER — Other Ambulatory Visit: Payer: Self-pay

## 2019-10-24 ENCOUNTER — Encounter: Payer: Medicare Other | Attending: Internal Medicine | Admitting: Nutrition

## 2019-10-24 DIAGNOSIS — E1065 Type 1 diabetes mellitus with hyperglycemia: Secondary | ICD-10-CM | POA: Insufficient documentation

## 2019-10-24 DIAGNOSIS — E1042 Type 1 diabetes mellitus with diabetic polyneuropathy: Secondary | ICD-10-CM | POA: Insufficient documentation

## 2019-10-25 ENCOUNTER — Telehealth: Payer: Self-pay | Admitting: Internal Medicine

## 2019-10-25 NOTE — Progress Notes (Signed)
Patient was trained on how to use the [employer] pump.  Settings were put per Dr. Arman Filter orders:  Basal rate:  1,0u/hr., I/C:8, ISF: 130, timing:3 hr.  Control IQ was turned on with low alert: 70, and high alert: 260.   She filled a cartridge with Novolog insulin and attached a 6mm infusion set to her right abdomen.  She was shown how to give a bolus and she re demonstrated this correctly X2. She was also trained in how to use the Dexcom sensor. We discussed the difference between sensor readings and blood sugar readings and she reported good understanding of this.  We also discussed times when she will need to test her blood sugar and she reported good understanding of this as well.  The Dexcom was linked to her pump and the pump was started at [facility]   She reports that she knows how to count carbs, and she did not need further information on this.   She was given handouts on how to change the cartridge, how to give a bolus, how to view the status screen, how to start/stop the pump, and identification of symbols on the pump for control IQ.  She was encouraged to review all of these, and agreed to do this.  Her phone would not support the G6 app, and she was not able to link her phone to our practice.  She was told by the [employer] representative to call the help line, and he gave her his card for further assistance.  She was entered into the computer at our office before leaving. She singed the checklist as understanding all topics and had no final questions.

## 2019-10-25 NOTE — Telephone Encounter (Signed)
[name] from [name] called asking how to go about having a zoom meeting with Dr Cruzita Lederer in regards to a [employer] claim.   Lawfirm [phone]

## 2019-10-26 ENCOUNTER — Telehealth: Payer: Self-pay | Admitting: Nutrition

## 2019-10-26 NOTE — Patient Instructions (Signed)
Review manual and checklists given Call [employer] 800 number if questions

## 2019-10-26 NOTE — Telephone Encounter (Signed)
Patient reports that blood sugars dropped low before supper and are low now, 2 hours after supper.  She thinks that her basal rates are too high and that she would do better if her rates were reduced.  She was talked into making changes to her basal rate, and it was reduced from 5PM to MN by 0.1.  I told her that I would contact her tomorrow and that we can re evaluate this in the morning.

## 2019-10-26 NOTE — Telephone Encounter (Signed)
8:00AM Messages left on home, and cell phones to call me.     At [place], patient returned my phone call and said her blood sugars were low all night.  She said that her blood sugars started dropping late in the evening and after treating it with juice several times, she had to stop the pump during the night, and her FBS this AM was in the low 100s.  We reviewed her settings and the I/C ratio was 1.8, not 8.  Changes were made to this, as well as reducing her basal rated by 0.1 for 24 hours.

## 2019-10-26 NOTE — Telephone Encounter (Signed)
[name] YOU so much for catching this!!!!!

## 2019-10-29 NOTE — Telephone Encounter (Signed)
Email sent to [name] with risk management regarding this

## 2019-10-31 ENCOUNTER — Encounter: Payer: Medicare Other | Admitting: Nutrition

## 2019-11-07 ENCOUNTER — Telehealth: Payer: Self-pay | Admitting: Nutrition

## 2019-11-07 NOTE — Telephone Encounter (Signed)
Left message on my voicemail canceling her appointment for followup, and atient calling back to reschedule her appointment for pump start follow-up.  She was requesting that her insulin be called into the pharmacy in vials now, since starting on the pump.   She is taking approximately 50u/day.   Left her a voicemail with times I have available for appointments the week of 4/19.

## 2019-11-08 ENCOUNTER — Telehealth: Payer: Self-pay | Admitting: Nutrition

## 2019-11-08 ENCOUNTER — Encounter: Payer: Medicare Other | Attending: Internal Medicine | Admitting: Nutrition

## 2019-11-08 DIAGNOSIS — E1065 Type 1 diabetes mellitus with hyperglycemia: Secondary | ICD-10-CM | POA: Insufficient documentation

## 2019-11-08 DIAGNOSIS — E1042 Type 1 diabetes mellitus with diabetic polyneuropathy: Secondary | ICD-10-CM | POA: Insufficient documentation

## 2019-11-08 MED ORDER — INSULIN ASPART 100 UNIT/ML ~~LOC~~ SOLN: SUBCUTANEOUS | 1 refills | Status: DC

## 2019-11-08 NOTE — Telephone Encounter (Signed)
Message was left by front desk staff to see if she was coming today.  No answer.  Patient no showed for this appointment

## 2019-11-08 NOTE — Telephone Encounter (Signed)
Patient called back saying that she wanted to schedule for a follow-up and that she was having some low blood sugars.  I suggested she see Dr. Cruzita Lederer before scheduling with me since I am on vacation next week.  She did not want to do this, wanted to schedule when I return in 10 days.   I offered to come in tomorrow to finish her training and to download her pump for Dr. Cruzita Lederer to view.  She agreed to do this, appointment was scheduled for 10:30 on 11/08/19

## 2019-11-08 NOTE — Telephone Encounter (Signed)
Rx sent to pharmacy   

## 2019-11-09 ENCOUNTER — Emergency Department (HOSPITAL_COMMUNITY)
Admission: EM | Admit: 2019-11-09 | Discharge: 2019-11-09 | Disposition: A | Discharge status: Home / Self Care | Payer: Medicare Other | Attending: Emergency Medicine | Admitting: Emergency Medicine

## 2019-11-09 ENCOUNTER — Other Ambulatory Visit: Payer: Self-pay

## 2019-11-09 ENCOUNTER — Ambulatory Visit (HOSPITAL_COMMUNITY)
Admission: RE | Admit: 2019-11-09 | Discharge: 2019-11-09 | Disposition: A | Discharge status: Home / Self Care | Payer: Medicare Other | Source: Ambulatory Visit | Attending: Family Medicine | Admitting: Family Medicine

## 2019-11-09 ENCOUNTER — Other Ambulatory Visit: Payer: Self-pay | Admitting: Family Medicine

## 2019-11-09 ENCOUNTER — Other Ambulatory Visit (HOSPITAL_COMMUNITY): Payer: Self-pay | Admitting: Family Medicine

## 2019-11-09 DIAGNOSIS — R5383 Other fatigue: Secondary | ICD-10-CM | POA: Diagnosis not present

## 2019-11-09 DIAGNOSIS — Z5321 Procedure and treatment not carried out due to patient leaving prior to being seen by health care provider: Secondary | ICD-10-CM | POA: Diagnosis not present

## 2019-11-09 DIAGNOSIS — M79605 Pain in left leg: Secondary | ICD-10-CM | POA: Diagnosis present

## 2019-11-09 DIAGNOSIS — R0602 Shortness of breath: Secondary | ICD-10-CM | POA: Insufficient documentation

## 2019-11-09 LAB — CBC
HCT: 42.4 % (ref 36.0–46.0)
Hemoglobin: 14.3 g/dL (ref 12.0–15.0)
MCH: 31.2 pg (ref 26.0–34.0)
MCHC: 33.7 g/dL (ref 30.0–36.0)
MCV: 92.6 fL (ref 80.0–100.0)
Platelets: 198 10*3/uL (ref 150–400)
RBC: 4.58 MIL/uL (ref 3.87–5.11)
RDW: 13.2 % (ref 11.5–15.5)
WBC: 10.9 10*3/uL — ABNORMAL HIGH (ref 4.0–10.5)
nRBC: 0 % (ref 0.0–0.2)

## 2019-11-09 LAB — COMPREHENSIVE METABOLIC PANEL
ALT: 14 U/L (ref 0–44)
AST: 18 U/L (ref 15–41)
Albumin: 3.5 g/dL (ref 3.5–5.0)
Alkaline Phosphatase: 67 U/L (ref 38–126)
Anion gap: 10 (ref 5–15)
BUN: 9 mg/dL (ref 6–20)
CO2: 26 mmol/L (ref 22–32)
Calcium: 9 mg/dL (ref 8.9–10.3)
Chloride: 104 mmol/L (ref 98–111)
Creatinine, Ser: 0.74 mg/dL (ref 0.44–1.00)
GFR calc Af Amer: >60 mL/min (ref >60)
GFR calc non Af Amer: >60 mL/min (ref >60)
Glucose, Bld: 156 mg/dL — ABNORMAL HIGH (ref 70–99)
Potassium: 3.9 mmol/L (ref 3.5–5.1)
Sodium: 140 mmol/L (ref 135–145)
Total Bilirubin: 0.3 mg/dL (ref 0.3–1.2)
Total Protein: 5.7 g/dL — ABNORMAL LOW (ref 6.5–8.1)

## 2019-11-09 LAB — APTT: aPTT: 31 seconds (ref 24–36)

## 2019-11-09 LAB — PROTIME-INR
INR: 1 (ref 0.8–1.2)
Prothrombin Time: 12.7 seconds (ref 11.4–15.2)

## 2019-11-09 NOTE — ED Triage Notes (Signed)
Pt c/o left calf pain that radiates up her leg, some fatigue and SOB. Her family dr told her to come here yesterday to r/o a blood clot.

## 2019-11-09 NOTE — ED Notes (Signed)
Pt named called 3x for vitals, no response

## 2019-11-16 ENCOUNTER — Encounter (HOSPITAL_COMMUNITY): Payer: Self-pay

## 2019-11-16 ENCOUNTER — Emergency Department (HOSPITAL_COMMUNITY): Payer: Medicare Other

## 2019-11-16 ENCOUNTER — Other Ambulatory Visit: Payer: Self-pay

## 2019-11-16 ENCOUNTER — Emergency Department (HOSPITAL_COMMUNITY)
Admission: EM | Admit: 2019-11-16 | Discharge: 2019-11-16 | Disposition: A | Discharge status: Home / Self Care | Payer: Medicare Other | Attending: Emergency Medicine | Admitting: Emergency Medicine

## 2019-11-16 DIAGNOSIS — Z5320 Procedure and treatment not carried out because of patient's decision for unspecified reasons: Secondary | ICD-10-CM | POA: Diagnosis not present

## 2019-11-16 DIAGNOSIS — I10 Essential (primary) hypertension: Secondary | ICD-10-CM | POA: Diagnosis not present

## 2019-11-16 DIAGNOSIS — Z79899 Other long term (current) drug therapy: Secondary | ICD-10-CM | POA: Diagnosis not present

## 2019-11-16 DIAGNOSIS — R5383 Other fatigue: Secondary | ICD-10-CM | POA: Diagnosis not present

## 2019-11-16 DIAGNOSIS — Z8673 Personal history of transient ischemic attack (TIA), and cerebral infarction without residual deficits: Secondary | ICD-10-CM | POA: Diagnosis not present

## 2019-11-16 DIAGNOSIS — Z5321 Procedure and treatment not carried out due to patient leaving prior to being seen by health care provider: Secondary | ICD-10-CM

## 2019-11-16 DIAGNOSIS — E109 Type 1 diabetes mellitus without complications: Secondary | ICD-10-CM | POA: Insufficient documentation

## 2019-11-16 DIAGNOSIS — Z7982 Long term (current) use of aspirin: Secondary | ICD-10-CM | POA: Diagnosis not present

## 2019-11-16 DIAGNOSIS — Z794 Long term (current) use of insulin: Secondary | ICD-10-CM | POA: Insufficient documentation

## 2019-11-16 DIAGNOSIS — F17210 Nicotine dependence, cigarettes, uncomplicated: Secondary | ICD-10-CM | POA: Diagnosis not present

## 2019-11-16 DIAGNOSIS — R0789 Other chest pain: Secondary | ICD-10-CM | POA: Diagnosis not present

## 2019-11-16 DIAGNOSIS — R0602 Shortness of breath: Secondary | ICD-10-CM | POA: Insufficient documentation

## 2019-11-16 DIAGNOSIS — R079 Chest pain, unspecified: Secondary | ICD-10-CM | POA: Diagnosis present

## 2019-11-16 LAB — BASIC METABOLIC PANEL
Anion gap: 8 (ref 5–15)
BUN: 13 mg/dL (ref 6–20)
CO2: 26 mmol/L (ref 22–32)
Calcium: 9.2 mg/dL (ref 8.9–10.3)
Chloride: 105 mmol/L (ref 98–111)
Creatinine, Ser: 0.62 mg/dL (ref 0.44–1.00)
GFR calc Af Amer: >60 mL/min (ref >60)
GFR calc non Af Amer: >60 mL/min (ref >60)
Glucose, Bld: 197 mg/dL — ABNORMAL HIGH (ref 70–99)
Potassium: 4 mmol/L (ref 3.5–5.1)
Sodium: 139 mmol/L (ref 135–145)

## 2019-11-16 LAB — HEPATIC FUNCTION PANEL
ALT: 20 U/L (ref 0–44)
AST: 21 U/L (ref 15–41)
Albumin: 3.9 g/dL (ref 3.5–5.0)
Alkaline Phosphatase: 61 U/L (ref 38–126)
Bilirubin, Direct: 0.1 mg/dL (ref 0.0–0.2)
Indirect Bilirubin: 0.4 mg/dL (ref 0.3–0.9)
Total Bilirubin: 0.5 mg/dL (ref 0.3–1.2)
Total Protein: 6.6 g/dL (ref 6.5–8.1)

## 2019-11-16 LAB — URINALYSIS, ROUTINE W REFLEX MICROSCOPIC
Bilirubin Urine: NEGATIVE
Glucose, UA: 50 mg/dL — AB
Hgb urine dipstick: NEGATIVE
Ketones, ur: NEGATIVE mg/dL
Leukocytes,Ua: NEGATIVE
Nitrite: NEGATIVE
Protein, ur: NEGATIVE mg/dL
Specific Gravity, Urine: 1.003 — ABNORMAL LOW (ref 1.005–1.030)
pH: 6 (ref 5.0–8.0)

## 2019-11-16 LAB — CBC
HCT: 42.7 % (ref 36.0–46.0)
Hemoglobin: 14.3 g/dL (ref 12.0–15.0)
MCH: 30.6 pg (ref 26.0–34.0)
MCHC: 33.5 g/dL (ref 30.0–36.0)
MCV: 91.2 fL (ref 80.0–100.0)
Platelets: 186 10*3/uL (ref 150–400)
RBC: 4.68 MIL/uL (ref 3.87–5.11)
RDW: 13.2 % (ref 11.5–15.5)
WBC: 8.3 10*3/uL (ref 4.0–10.5)
nRBC: 0 % (ref 0.0–0.2)

## 2019-11-16 LAB — TROPONIN I (HIGH SENSITIVITY)
Troponin I (High Sensitivity): <2 ng/L (ref <18)
Troponin I (High Sensitivity): <2 ng/L (ref <18)

## 2019-11-16 LAB — BRAIN NATRIURETIC PEPTIDE: B Natriuretic Peptide: 28 pg/mL (ref 0.0–100.0)

## 2019-11-16 LAB — LIPASE, BLOOD: Lipase: 19 U/L (ref 11–51)

## 2019-11-16 LAB — D-DIMER, QUANTITATIVE: D-Dimer, Quant: <0.27 ug/mL-FEU (ref 0.00–0.50)

## 2019-11-16 LAB — GLUCOSE, CAPILLARY: Glucose-Capillary: 207 mg/dL — ABNORMAL HIGH (ref 70–99)

## 2019-11-16 MED ORDER — ACETAMINOPHEN 325 MG PO TABS
650.0000 mg | ORAL_TABLET | Freq: Once | ORAL | Status: AC
  Administered 2019-11-16: 650 mg via ORAL
  Filled 2019-11-16: qty 2

## 2019-11-16 NOTE — ED Notes (Signed)
Bladder scan showed 459

## 2019-11-16 NOTE — ED Notes (Signed)
Nurse helped pt unhook from monitor to go to the bathroom. Upon nurse returning she has noted pt is no where to be found. Staff stated they saw her dressed with pocket book and left. Nurse attempted to call pt and her husband cell phone, no answer. EDP aware.

## 2019-11-16 NOTE — ED Triage Notes (Signed)
Pt reports swelling to both lower legs and pain for the past 3 weeks.  Also reports sob, chest pain, and left arm numbness for the past 3 weeks.  Reports symptoms have been constant.  Pt saw her pcp last week and had [place] of both legs to r/o dvt.  Reports was negative for DVT.  PT says still having symptoms.

## 2019-11-16 NOTE — ED Provider Notes (Signed)
[facility] EMERGENCY DEPARTMENT Provider Note   CSN: [id number] Arrival date & time: 11/16/19  1426     History Chief Complaint  Patient presents with  . Chest Pain    [name] is a 49 y.o. female with a past medical history of DM one, TIA, hypertension, hyperlipidemia, anxiety, tendinitis, who presents today for evaluation of intermittent chest pain. She had a cardiac catheterization in 2016 that showed normal coronary arteries. She reports that over the past 3 weeks she has had constant leg swelling that waxes and wanes, pain in both of her legs, along with feeling fatigued, short of breath with intermittent tingling of her entire left arm. She reports that she has had intermittent chest pain. She denies nausea, vomiting, diarrhea. No known Covid contacts, or cough. She has not previously been diagnosed with Covid and has not been vaccinated. She denies significant abdominal pain.  HPI     Past Medical History:  Diagnosis Date  . Benign paroxysmal vertigo   . Cervicalgia   . Depression   . Diabetes ([place])    Type1  . Diabetes mellitus without complication ([place])   . GERD (gastroesophageal reflux disease)   . Hiatal hernia   . History of cardiac catheterization    Normal coronary arteries 2016  . History of TIA (transient ischemic attack)   . Hyperlipidemia   . Hypertension   . IBS (irritable bowel syndrome)   . Migraine headache   . Panic disorder with agoraphobia   . Peripheral neuropathy   . Subungual hematoma of digit of hand   . Tendonitis   . Vertigo     Patient Active Problem List   Diagnosis Date Noted  . DKA (diabetic ketoacidoses) ([place]) 09/29/2018  . Dehydration 09/29/2018  . AKI (acute kidney injury) ([place]) 09/29/2018  . Hyperkalemia 09/29/2018  . Hyponatremia 09/29/2018  . S/P carpal tunnel release right 08/24/18 09/14/2018  . Carpal tunnel syndrome of right wrist   . Depression 01/06/2018  . Other constipation 01/05/2018  . Family hx of colon  cancer 11/10/2017  . Chest pain 03/25/2015  . 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  . Migraine 03/25/2015  . Hypertension   . Hyperlipidemia   . Anxiety   . Pain in the chest   . Essential hypertension   . Poorly controlled type 1 diabetes mellitus with peripheral neuropathy ([place]) 10/28/2014  . TIA (transient ischemic attack) 08/17/2014  . Tobacco abuse   . Gastro-esophageal reflux disease without esophagitis 01/28/2014  . Peripheral neuropathy 11/05/2013    Past Surgical History:  Procedure Laterality Date  . ABDOMINAL HYSTERECTOMY    . APPENDECTOMY    . CARDIAC CATHETERIZATION   last in 2009   x 4, normal coronary arteries  . CARDIAC CATHETERIZATION N/A 03/26/2015   Procedure: Left Heart Cath and Coronary Angiography;  Surgeon: Wellington Hampshire, MD;  Location: [place] [facility];  Service: Cardiovascular;  Laterality: N/A;  . CARPAL TUNNEL RELEASE Right 08/24/2018   Procedure: CARPAL TUNNEL RELEASE;  Surgeon: Carole Civil, MD;  Location: [facility];  Service: Orthopedics;  Laterality: Right;  . CESAREAN SECTION    . CHOLECYSTECTOMY    . COLONOSCOPY WITH PROPOFOL N/A 01/09/2018   Procedure: COLONOSCOPY WITH PROPOFOL;  Surgeon: Rogene Houston, MD;  Location: [facility];  Service: Endoscopy;  Laterality: N/A;  . COLONOSCOPY WITH PROPOFOL N/A 09/21/2019   Procedure: COLONOSCOPY WITH PROPOFOL;  Surgeon: Rogene Houston, MD;  Location: [facility];  Service:  Endoscopy;  Laterality: N/A;  1055  . CYST EXCISION     right breast  . ESOPHAGOGASTRODUODENOSCOPY (EGD) WITH PROPOFOL N/A 01/09/2018   Procedure: ESOPHAGOGASTRODUODENOSCOPY (EGD) WITH PROPOFOL;  Surgeon: Rogene Houston, MD;  [facility];  Service: Endoscopy;  Laterality: N/A;  . POLYPECTOMY  09/21/2019   Procedure: POLYPECTOMY;  Surgeon: Rogene Houston, MD;  Location[facility];  Service: Endoscopy;;  ascending colon;  . 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 TUBAL LIGATION       OB History   No obstetric history on file.     Family History  Problem Relation Age of Onset  . Stroke Mother 29  . Heart attack Mother 54  . Cancer Sister        colon  . Heart failure Maternal Grandmother 65  . Cancer Maternal Grandfather        prostate  . Heart failure Paternal Grandmother 25  . Heart failure Paternal Grandfather 87    Social History   Tobacco Use  . Smoking status: Current Every Day Smoker    Packs/day: 0.50    Years: 11.00    Pack years: 5.50    Types: Cigarettes  . Smokeless tobacco: Never Used  Substance Use Topics  . Alcohol use: Yes    Alcohol/week: 0.0 standard drinks    Comment: occasionally  . Drug use: No    Home Medications Prior to Admission medications   Medication Sig Start Date End Date Taking? Authorizing Provider  ascorbic acid (VITAMIN C) 500 MG tablet Take 500 mg by mouth daily.    [provider]  aspirin EC 81 MG tablet Take 1 tablet (81 mg total) by mouth daily. 09/22/19   Rogene Houston, MD  cholecalciferol (VITAMIN D3) 25 MCG (1000 UT) tablet Take 1,000 Units by mouth daily.    [provider]  diazepam (VALIUM) 5 MG tablet Take 5 mg by mouth every 8 (eight) hours as needed for anxiety.    [provider]  dicyclomine (BENTYL) 10 MG capsule Take 1 capsule (10 mg total) by mouth 3 (three) times daily as needed (abd pain). 08/27/19   Minus Liberty, PA-C  docusate sodium (COLACE) 100 MG capsule Take 100-200 mg by mouth See admin instructions. Take 100 mg by mouth in the morning and 200 mg in the evening    [provider]  EPIPEN 2-PAK 0.3 MG/0.3ML SOAJ injection Inject 0.3 mg into the muscle as needed for anaphylaxis.  02/13/16   [provider]  estradiol (ESTRACE) 1 MG tablet Take 1 mg  by mouth daily.    [provider]  GLUCAGEN HYPOKIT 1 MG SOLR injection Inject 1 mg into the skin once as needed for low blood sugar.  03/16/18   [provider]  ibuprofen (ADVIL) 400 MG tablet Take 400 mg by mouth 2 (two) times daily as needed for headache or moderate pain.     [provider]  insulin aspart (NOVOLOG) 100 UNIT/ML injection Inject 10-18 Units into the skin 3 (three) times daily with meals. 11/08/19   Philemon Kingdom, MD  LANTUS SOLOSTAR 100 UNIT/ML Solostar Pen Inject 25 units in am and 10 units at night Patient taking differently: Inject 10-25 Units into the skin See admin instructions. Inject 25 units in  am and 10 units at night 06/01/19   Philemon Kingdom, MD  linaclotide [facility] [place]) 145 MCG CAPS capsule Take 1 capsule (145 mcg total) by mouth daily. 09/21/19   Rehman, Mechele Dawley, MD  lisinopril (PRINIVIL,ZESTRIL) 5 MG tablet Take 5 mg by mouth daily. 06/10/15   [provider]  Melatonin 5 MG TABS Take 10 mg by mouth at bedtime as needed (sleep).  03/16/18   [provider]  Omega-3 Fatty Acids (FISH OIL) 1000 MG CAPS Take 1,000 mg by mouth daily.    [provider]  polycarbophil (FIBERCON) 625 MG tablet Take 625 mg by mouth daily.    [provider]  pravastatin (PRAVACHOL) 10 MG tablet Take 10 mg by mouth at bedtime. 05/29/15   [provider]  pregabalin (LYRICA) 300 MG capsule Take 300 mg by mouth 2 (two) times daily.    [provider]  promethazine (PHENERGAN) 25 MG tablet Take 25 mg by mouth every 6 (six) hours as needed for nausea/vomiting. 08/27/19   [provider]  tiZANidine (ZANAFLEX) 4 MG tablet Take 4 mg by mouth every 8 (eight) hours as needed for muscle spasms.     [provider]  vitamin B-12 (CYANOCOBALAMIN) 1000 MCG tablet Take 2,000 mcg by mouth daily.    [provider]    Allergies    Bee venom, Zocor [simvastatin], Clindamycin/lincomycin, Codeine,  Penicillins, and Sulfa antibiotics  Review of Systems   Review of Systems  Constitutional: Positive for fatigue. Negative for chills and fever.  Respiratory: Positive for chest tightness and shortness of breath. Negative for cough.   Cardiovascular: Positive for chest pain and leg swelling. Negative for palpitations.  Gastrointestinal: Negative for abdominal pain, diarrhea, nausea and vomiting.  Genitourinary: Negative for dysuria and urgency.  Musculoskeletal: Negative for back pain and neck pain.  Skin: Negative for color change, rash and wound.  Neurological: Negative for weakness and headaches.  Psychiatric/Behavioral: Negative for confusion.  All other systems reviewed and are negative.   Physical Exam Updated Vital Signs BP 114/75 (BP Location: Left Arm)   Pulse 72   Temp 98.3 F (36.8 C) (Oral)   Resp 17   Ht 5\' 7"  (1.702 m)   Wt 70.8 kg   SpO2 96%   BMI 24.43 kg/m   Physical Exam Vitals and nursing note reviewed.  Constitutional:      General: She is not in acute distress.    Appearance: She is well-developed.  HENT:     Head: Normocephalic and atraumatic.  Eyes:     Conjunctiva/sclera: Conjunctivae normal.  Cardiovascular:     Rate and Rhythm: Normal rate and regular rhythm.     Pulses:          Radial pulses are 2+ on the right side.       Dorsalis pedis pulses are 2+ on the right side and 2+ on the left side.       Posterior tibial pulses are 2+ on the right side and 2+ on the left side.     Heart sounds: Normal heart sounds. No murmur.  Pulmonary:     Effort: Pulmonary effort is normal. No respiratory distress.     Breath sounds: Normal breath sounds.  Abdominal:     General: Bowel sounds are normal.     Palpations: Abdomen is soft. There is no mass.     Tenderness: There is no abdominal tenderness. There is no guarding.  Musculoskeletal:     Cervical back: Neck  supple.     Right lower leg: No tenderness.     Left lower leg: No tenderness.      Comments: Bilateral lower extremities with minimal edema, borderline pitting.  Skin:    General: Skin is warm and dry.  Neurological:     Mental Status: She is alert.     Comments: She is awake and alert. Speech is not slurred. Interacts appropriately. 5/5 strength bilateral upper and lower extremities.  Psychiatric:        Mood and Affect: Mood normal.     ED Results / Procedures / Treatments   Labs (all labs ordered are listed, but only abnormal results are displayed) Labs Reviewed  BASIC METABOLIC PANEL - Abnormal; Notable for the following components:      Result Value   Glucose, Bld 197 (*)    All other components within normal limits  GLUCOSE, CAPILLARY - Abnormal; Notable for the following components:   Glucose-Capillary 207 (*)    All other components within normal limits  URINALYSIS, ROUTINE W REFLEX MICROSCOPIC - Abnormal; Notable for the following components:   Color, Urine STRAW (*)    Specific Gravity, Urine 1.003 (*)    Glucose, UA 50 (*)    All other components within normal limits  CBC  BRAIN NATRIURETIC PEPTIDE  HEPATIC FUNCTION PANEL  D-DIMER, QUANTITATIVE (NOT AT [facility])  LIPASE, BLOOD  TROPONIN I (HIGH SENSITIVITY)  TROPONIN I (HIGH SENSITIVITY)    EKG EKG Interpretation  Date/Time:  Friday November 16 2019 15:11:52 EDT Ventricular Rate:  80 PR Interval:  166 QRS Duration: 80 QT Interval:  360 QTC Calculation: 415 R Axis:   93 Text Interpretation: Normal sinus rhythm Right atrial enlargement Rightward axis Borderline ECG Since last tracing rate slower Confirmed by Dorie Rank [phone]) on 11/16/2019 5:31:15 PM   Radiology DG Chest 2 View  Result Date: 11/16/2019 CLINICAL DATA:  Chest pain and shortness of breath EXAM: CHEST - 2 VIEW COMPARISON:  11/12/2019 FINDINGS: The heart size and mediastinal contours are within normal limits. Both lungs are clear. The visualized skeletal structures are unremarkable. IMPRESSION: No active cardiopulmonary disease.  Electronically Signed   By: Inez Catalina M.D.   On: 11/16/2019 15:59    Procedures Procedures (including critical care time)  Medications Ordered in ED Medications  acetaminophen (TYLENOL) tablet 650 mg (650 mg Oral Given 11/16/19 1723)    ED Course  I have reviewed the triage vital signs and the nursing notes.  Pertinent labs & imaging results that were available during my care of the patient were reviewed by me and considered in my medical decision making (see chart for details).  Clinical Course as of Nov 15 1901  Fri Nov 16, 2019  1822 Was informed that patient eloped from the department. Nursing reportedly attempted to call patient x2 with no answer.    [EH]    Clinical Course User Index [EH] Ollen Gross   MDM Rules/Calculators/A&P                     Patient is a 49 year old woman who presents today for evaluation of multiple complaints including shortness of breath, chest pain, decreased sensation to light touch to the left arm, bilateral leg swelling and fatigue.  She has previously had ultrasound which was negative for DVT.  On my exam she has minimal edema to the bilateral ankles.  Chest x-ray was obtained without evidence of acute abnormality.  As she is 49 years old  and has type 1 diabetes increase concern for underlying neuropathy which may be making physical exam less reliable.  EKG without evidence of acute abnormalities.  Chest x-ray without acute cardiopulmonary processes.  Labs are obtained and reviewed, D-dimer is not elevated.  Troponin was obtained in triage.  Given the intermittent nature of her chest pain on the course in the setting of a negative troponin low suspicion for ACS.  BNP is not significantly elevated.  BMP shows hyperglycemia at 197 without other acute abnormalities.  Hepatic function panel and lipase are normal.   UA has 50 glucose however no ketones.  Patient requested pain medicine which was given.  Before I could discuss  results with patient I was informed that she had eloped.  I was unable to reevaluate her prior to her elopement we discussed the risks of this decision with her.  Nursing reportedly attempted to contact patient x2 with no answer.  Note: Portions of this report may have been transcribed using voice recognition software. Every effort was made to ensure accuracy; however, inadvertent computerized transcription errors may be present  Final Clinical Impression(s) / ED Diagnoses Final diagnoses:  Eloped from emergency department  Atypical chest pain  Fatigue, unspecified type    Rx / DC Orders ED Discharge Orders    None       Ollen Gross 11/16/19 Allegra Lai, MD 11/18/19 [phone]

## 2019-11-18 NOTE — Progress Notes (Addendum)
Cardiology Office Note  Date: 11/19/2019   ID: [name], DOB 20-Dec-1970, MRN [id number]  PCP:  Denny Levy, PA  Cardiologist:  Rozann Lesches, MD Electrophysiologist:  None   Chief Complaint: F/U HTN, HLD, DM II,   History of Present Illness: [name] is a 49 y.o. female with a history of  HTN, HLD, DM II.  Recent ER visit  11/16/2019 for c/o of intermittent chest pain, SOB, bilateral leg edema, and fatigue. EKG was negative for acute abnormalities, CXR no acute changes. BNP was negative, troponins were negative.  Last saw Dr. Domenic Polite on 05/03/2018 for complaints of chest pain during an ER visit.  At that time she complained of left-sided chest discomfort with some radiation into the left shoulder and upper arm not precipitated by activity.  Also had some relative fluctuations in blood pressure and heart rate at home.  She had a previous consultation with Dr. Harl Bowie back in 2016 related to chest pain.  She had a subsequent cardiac catheterization at that time which revealed normal coronary arteries.  Patient states she continues to have intermittent chest pain primarily when performing tasks such as walking.  She states the pain sometimes radiates to her left arm and she has some nausea, shortness of breath, and sweatiness.  She has several significant risk factors for heart disease including long history of smoking, long history of diabetes from age 80. Recent random glucose at ER visit 3 days ago 207.  A first-degree relative in her mother who had heart disease in her early 89s.  She is asking about possible cardiac catheterization.  Patient denies any stroke or TIA-like symptoms.  States she does have occasional palpitations which are not bothersome.  She has had some recent left lower extremity pain and had a D-dimer checked as well as a left lower venous study and had no evidence of DVT.  States she does have leg pain when walking.  States she has had some recent lower  extremity edema which she has not had in the past.   Past Medical History:  Diagnosis Date  . Benign paroxysmal vertigo   . Cervicalgia   . Depression   . Diabetes ([place])    Type1  . Diabetes mellitus without complication ([place])   . GERD (gastroesophageal reflux disease)   . Hiatal hernia   . History of cardiac catheterization    Normal coronary arteries 2016  . History of TIA (transient ischemic attack)   . Hyperlipidemia   . Hypertension   . IBS (irritable bowel syndrome)   . Migraine headache   . Panic disorder with agoraphobia   . Peripheral neuropathy   . Subungual hematoma of digit of hand   . Tendonitis   . Vertigo     Past Surgical History:  Procedure Laterality Date  . ABDOMINAL HYSTERECTOMY    . APPENDECTOMY    . CARDIAC CATHETERIZATION   last in 2009   x 4, normal coronary arteries  . CARDIAC CATHETERIZATION N/A 03/26/2015   Procedure: Left Heart Cath and Coronary Angiography;  Surgeon: Wellington Hampshire, MD;  Location: [facility];  Service: Cardiovascular;  Laterality: N/A;  . CARPAL TUNNEL RELEASE Right 08/24/2018   Procedure: CARPAL TUNNEL RELEASE;  Surgeon: Carole Civil, MD;  Location: [facility];  Service: Orthopedics;  Laterality: Right;  . CESAREAN SECTION    . CHOLECYSTECTOMY    . COLONOSCOPY WITH PROPOFOL N/A 01/09/2018   Procedure: COLONOSCOPY WITH PROPOFOL;  Surgeon: Laural Golden,  Mechele Dawley, MD;  Location: [facility];  Service: Endoscopy;  Laterality: N/A;  . COLONOSCOPY WITH PROPOFOL N/A 09/21/2019   Procedure: COLONOSCOPY WITH PROPOFOL;  Surgeon: Rogene Houston, MD;  Location: [facility];  Service: Endoscopy;  Laterality: N/A;  1055  . CYST EXCISION     right breast  . ESOPHAGOGASTRODUODENOSCOPY (EGD) WITH PROPOFOL N/A 01/09/2018   Procedure: ESOPHAGOGASTRODUODENOSCOPY (EGD) WITH PROPOFOL;  Surgeon: Rogene Houston, MD;  [facility];  Service: Endoscopy;  Laterality: N/A;  . POLYPECTOMY  09/21/2019   Procedure: POLYPECTOMY;   Surgeon: Rogene Houston, MD;  Location: [facility];  Service: Endoscopy;;  ascending colon;  . 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 TUBAL LIGATION      Current Outpatient Medications  Medication Sig Dispense Refill  . aspirin EC 81 MG tablet Take 1 tablet (81 mg total) by mouth daily. 90 tablet 3  . cholecalciferol (VITAMIN D3) 25 MCG (1000 UT) tablet Take 1,000 Units by mouth daily.    . diazepam (VALIUM) 5 MG tablet Take 5 mg by mouth every 8 (eight) hours as needed for anxiety.    . dicyclomine (BENTYL) 10 MG capsule Take 1 capsule (10 mg total) by mouth 3 (three) times daily as needed (abd pain). 30 capsule 1  . docusate sodium (COLACE) 100 MG capsule Take 100-200 mg by mouth See admin instructions. Take 100 mg by mouth in the morning and 200 mg in the evening    . EPIPEN 2-PAK 0.3 MG/0.3ML SOAJ injection Inject 0.3 mg into the muscle as needed for anaphylaxis.     Marland Kitchen estradiol (ESTRACE) 1 MG tablet Take 1 mg by mouth daily.    Marland Kitchen GLUCAGEN HYPOKIT 1 MG SOLR injection Inject 1 mg into the skin once as needed for low blood sugar.     . ibuprofen (ADVIL) 400 MG tablet Take 400 mg by mouth 2 (two) times daily as needed for headache or moderate pain.     Marland Kitchen insulin aspart (NOVOLOG) 100 UNIT/ML injection Inject 10-18 Units into the skin 3 (three) times daily with meals. 48.6 mL 1  . LANTUS SOLOSTAR 100 UNIT/ML Solostar Pen Inject 25 units in am and 10 units at night (Patient taking differently: Inject 10-25 Units into the skin See admin instructions. Inject 25 units in am and 10 units at night) 30 mL 3  . linaclotide (LINZESS) 145 MCG CAPS capsule Take 1 capsule (145 mcg total) by mouth daily. 30 capsule 5  . lisinopril (PRINIVIL,ZESTRIL) 5 MG tablet Take 5 mg by mouth daily.  2  . Melatonin 5 MG TABS  Take 10 mg by mouth at bedtime as needed (sleep).     . Omega-3 Fatty Acids (FISH OIL) 1000 MG CAPS Take 1,000 mg by mouth daily.    . polycarbophil (FIBERCON) 625 MG tablet Take 625 mg by mouth daily.    . pravastatin (PRAVACHOL) 10 MG tablet Take 10 mg by mouth at bedtime.  2  . pregabalin (LYRICA) 300 MG capsule Take 300 mg by mouth 2 (two) times daily.    . promethazine (PHENERGAN) 25 MG tablet Take 25 mg by mouth every 6 (six) hours as needed for nausea/vomiting.    Marland Kitchen tiZANidine (ZANAFLEX) 4 MG tablet Take 4 mg by mouth every 8 (eight) hours as  needed for muscle spasms.     . nitroGLYCERIN (NITROSTAT) 0.4 MG SL tablet Place 1 tablet (0.4 mg total) under the tongue every 5 (five) minutes x 3 doses as needed for chest pain (if no relief after 3rd dose, proceed to the ED for an evaluation or call 911). 25 tablet 3   No current facility-administered medications for this visit.   Allergies:  Bee venom, Zocor [simvastatin], Clindamycin/lincomycin, Codeine, Penicillins, and Sulfa antibiotics   Social History: The patient  reports that she has been smoking cigarettes. She has a 5.50 pack-year smoking history. She has never used smokeless tobacco. She reports current alcohol use. She reports that she does not use drugs.   Family History: The patient's family history includes Cancer in her maternal grandfather and sister; Heart attack (age of onset: 2) in her mother; Heart failure (age of onset: 33) in her paternal grandfather; Heart failure (age of onset: 97) in her maternal grandmother; Heart failure (age of onset: 63) in her paternal grandmother; Stroke (age of onset: 71) in her mother.   ROS:  Please see the history of present illness. Otherwise, complete review of systems is positive for none.  All other systems are reviewed and negative.   Physical Exam: VS:  BP 104/64   Pulse 77   Ht 5\' 7"  (1.702 m)   Wt 162 lb 3.2 oz (73.6 kg)   SpO2 94%   BMI 25.40 kg/m , BMI Body mass index is 25.4  kg/m.  Wt Readings from Last 3 Encounters:  11/19/19 162 lb 3.2 oz (73.6 kg)  11/16/19 156 lb (70.8 kg)  09/21/19 155 lb (70.3 kg)    General: Patient appears comfortable at rest. Neck: Supple, no elevated JVP or carotid bruits, no thyromegaly. Lungs: Clear to auscultation, nonlabored breathing at rest. Cardiac: Regular rate and rhythm, no S3 or significant systolic murmur, no pericardial rub. Extremities: No pitting edema, distal pulses 2+. Skin: Warm and dry. Musculoskeletal: No kyphosis. Neuropsychiatric: Alert and oriented x3, affect grossly appropriate.  ECG:   Recent EKG on 11/16/2019 at the emergency department showed normal sinus rhythm rate of 80, right atrial enlargement, rightward access.  Recent Labwork: 12/26/2018: TSH 2.15 11/16/2019: ALT 20; AST 21; B Natriuretic Peptide 28.0; BUN 13; Creatinine, Ser 0.62; Hemoglobin 14.3; Platelets 186; Potassium 4.0; Sodium 139     Component Value Date/Time   CHOL 156 12/26/2018 1350   TRIG 86.0 12/26/2018 1350   HDL 44.80 12/26/2018 1350   CHOLHDL 3 12/26/2018 1350   VLDL 17.2 12/26/2018 1350   LDLCALC 94 12/26/2018 1350    Other Studies Reviewed Today:  Cardiac catheterization 03/26/2015: 1. Normal coronary arteries in a left dominant system.  2. Normal LV systolic function and left ventricular end-diastolic pressure.  Recommendations: The chest pain does not seem to be cardiac. Continue aggressive treatment of risk factors.  Echocardiogram 08/18/2014: Study Conclusions  - Left ventricle: The cavity size was normal. Systolic function was normal. The estimated ejection fraction was in the range of 55% to 60%. Wall motion was normal; there were no regional wall motion abnormalities. - Aortic valve: Valve area (Vmax): 1.98 cm^2.  Impressions:  - No cardiac source of emboli was indentified.  Lower Extremity Venous Duplex for LLE pain IMPRESSION: No femoropopliteal DVT nor evidence of DVT within the  visualized calf veins.  If clinical symptoms are inconsistent or if there are persistent or worsening symptoms, further imaging (possibly involving the iliac veins) may be warranted.   Assessment and Plan:  1. Atypical chest pain   2. Mixed hyperlipidemia   3. Type 2 diabetes mellitus without complication, with long-term current use of insulin ([place])   4. Essential hypertension    1. Atypical chest pain Patient has recently been having frequent intermittent chest pains mostly associated with exertional activity such as walking.  States additional symptoms include radiation to left arm with some nausea, sweating, and dyspnea.  Recent emergency room visit on 4/162021.  She was ruled out for ACS.  She does have some major risk factors for heart disease including long-term smoking, significant history of diabetes from age 42.  Heart disease in a first-degree relative of her mother who had coronary disease in her early 62s.  Please order a Lexiscan Myoview stress test.  Order sublingual nitroglycerin as needed for chest pain  2. Mixed hyperlipidemia Recent lipid panel Dec 26, 2018 showed TC 146, TG 86, HDL 44, LDL 94.  Continue Pravachol 10 mg daily.  3. Type 2 diabetes mellitus without complication, with long-term current use of insulin ([facility]) Patient's had a long history of diabetes since age 89.  She has previously been on insulin pumps in the past.  Managed by PCP  4.  Dyspnea Patient states he has been having increased dyspnea on exertion along with associated chest pain when performing exertional activity.  Also complains of recent lower extremity edema.  Get a 2D echocardiogram.  5.  Hypertension. Blood pressures well controlled on current medication.  Blood pressure today 104/64.  Continue lisinopril 5 mg p.o. daily  6.  Smoking Patient has approximately 17 years history of smoking.  Highly advised patient to stop smoking given her other risk factors for heart disease.  Patient  states she knows she needs to quit.  Medication Adjustments/Labs and Tests Ordered: Current medicines are reviewed at length with the patient today.  Concerns regarding medicines are outlined above.   Disposition: Follow-up with Dr. Domenic Polite or APP 6 to 8 weeks status post stress test and echocardiogram.  Signed, Levell July, NP 11/19/2019 10:17 AM    [facility] [place], [place], [place] [address] Phone: [phone]; Fax: [phone]

## 2019-11-19 ENCOUNTER — Encounter: Payer: Self-pay | Admitting: Family Medicine

## 2019-11-19 ENCOUNTER — Telehealth: Payer: Self-pay

## 2019-11-19 ENCOUNTER — Encounter: Payer: Self-pay | Admitting: *Deleted

## 2019-11-19 ENCOUNTER — Other Ambulatory Visit: Payer: Self-pay

## 2019-11-19 ENCOUNTER — Ambulatory Visit (INDEPENDENT_AMBULATORY_CARE_PROVIDER_SITE_OTHER): Payer: Medicare Other | Admitting: Family Medicine

## 2019-11-19 VITALS — BP 104/64 | HR 77 | Ht 67.0 in | Wt 162.2 lb

## 2019-11-19 DIAGNOSIS — R0789 Other chest pain: Secondary | ICD-10-CM | POA: Diagnosis not present

## 2019-11-19 DIAGNOSIS — E119 Type 2 diabetes mellitus without complications: Secondary | ICD-10-CM

## 2019-11-19 DIAGNOSIS — I10 Essential (primary) hypertension: Secondary | ICD-10-CM | POA: Diagnosis not present

## 2019-11-19 DIAGNOSIS — E782 Mixed hyperlipidemia: Secondary | ICD-10-CM

## 2019-11-19 DIAGNOSIS — Z8673 Personal history of transient ischemic attack (TIA), and cerebral infarction without residual deficits: Secondary | ICD-10-CM

## 2019-11-19 DIAGNOSIS — Z794 Long term (current) use of insulin: Secondary | ICD-10-CM

## 2019-11-19 MED ORDER — NITROGLYCERIN 0.4 MG SL SUBL: 0.4000 mg | SUBLINGUAL_TABLET | SUBLINGUAL | 3 refills | Status: DC | PRN

## 2019-11-19 NOTE — Patient Instructions (Addendum)
Medication Instructions:    Your physician recommends that you continue on your current medications as directed. Please refer to the Current Medication list given to you today.  Nitroglycerin prescription sent to your pharmacy today. Please Dissolve one under tongue for severe chest pain every 5 minutes up to 3 doses. If no relief, proceed to ED.  Labwork:  NONE  Testing/Procedures: Your physician has requested that you have an echocardiogram. Echocardiography is a painless test that uses sound waves to create images of your heart. It provides your doctor with information about the size and shape of your heart and how well your heart's chambers and valves are working. This procedure takes approximately one hour. There are no restrictions for this procedure. Your physician has requested that you have a lexiscan myoview. For further information please visit [email or web address]. Please follow instruction sheet, as given.  Follow-Up:  Your physician recommends that you schedule a follow-up appointment in: 6-8 weeks (office).  Any Other Special Instructions Will Be Listed Below (If Applicable).  If you need a refill on your cardiac medications before your next appointment, please call your pharmacy.

## 2019-11-19 NOTE — Telephone Encounter (Signed)
NOTES ON FILE FROM [facility] [phone] , SENT REFERRAL TO SCHEDULING  

## 2019-11-29 ENCOUNTER — Ambulatory Visit (HOSPITAL_COMMUNITY)
Admission: RE | Admit: 2019-11-29 | Discharge: 2019-11-29 | Disposition: A | Discharge status: Home / Self Care | Payer: Medicare Other | Source: Ambulatory Visit | Attending: Family Medicine | Admitting: Family Medicine

## 2019-11-29 ENCOUNTER — Telehealth: Payer: Self-pay | Admitting: *Deleted

## 2019-11-29 ENCOUNTER — Other Ambulatory Visit: Payer: Self-pay

## 2019-11-29 ENCOUNTER — Encounter (HOSPITAL_COMMUNITY): Payer: Self-pay

## 2019-11-29 ENCOUNTER — Encounter (HOSPITAL_COMMUNITY)
Admission: RE | Admit: 2019-11-29 | Discharge: 2019-11-29 | Disposition: A | Discharge status: Home / Self Care | Payer: Medicare Other | Source: Ambulatory Visit | Attending: Family Medicine | Admitting: Family Medicine

## 2019-11-29 DIAGNOSIS — R0789 Other chest pain: Secondary | ICD-10-CM

## 2019-11-29 LAB — NM MYOCAR MULTI W/SPECT W/WALL MOTION / EF
LV dias vol: 63 mL (ref 46–106)
LV sys vol: 29 mL
Peak HR: 94 {beats}/min
RATE: 0.37
Rest HR: 62 {beats}/min
SDS: 0
SRS: 0
SSS: 0
TID: 1.19

## 2019-11-29 MED ORDER — REGADENOSON 0.4 MG/5ML IV SOLN
INTRAVENOUS | Status: AC
  Administered 2019-11-29: 0.4 mg
  Filled 2019-11-29: qty 5

## 2019-11-29 MED ORDER — TECHNETIUM TC 99M TETROFOSMIN IV KIT
10.0000 | PACK | Freq: Once | INTRAVENOUS | Status: AC | PRN
  Administered 2019-11-29: 10.1 via INTRAVENOUS

## 2019-11-29 MED ORDER — TECHNETIUM TC 99M TETROFOSMIN IV KIT
30.0000 | PACK | Freq: Once | INTRAVENOUS | Status: AC | PRN
  Administered 2019-11-29: 31 via INTRAVENOUS

## 2019-11-29 MED ORDER — SODIUM CHLORIDE FLUSH 0.9 % IV SOLN
INTRAVENOUS | Status: AC
  Administered 2019-11-29: 10 mL via INTRAVENOUS
  Filled 2019-11-29: qty 10

## 2019-11-29 NOTE — Telephone Encounter (Signed)
Patient informed. Copy sent to PCP °

## 2019-11-29 NOTE — Progress Notes (Signed)
I.V. taken out at 1300. Site clean, dry and intact. 2x2 gauze at site and coban wrap dressing.  Alvino Chapel, RCS

## 2019-11-29 NOTE — Telephone Encounter (Signed)
-----   Message from [name]., NP sent at 11/29/2019  3:16 PM EDT ----- Please call the patient and tell her the echocardiogram looked good. Heart pumping function is good. She has no leaking valves. There is nothing on this study to suggest a reason for her syncopal episode

## 2019-11-29 NOTE — Progress Notes (Signed)
*  PRELIMINARY RESULTS* Echocardiogram 2D Echocardiogram has been performed.  [name] 11/29/2019, 1:59 PM

## 2019-11-29 NOTE — Telephone Encounter (Signed)
-----   Message from Verta Ellen., NP sent at 11/29/2019  3:18 PM EDT ----- Please call the patient and let her know her stress test l results look good. It des not show any evidence of a lack of blood flow through any of her coronary arteries.

## 2019-12-03 ENCOUNTER — Other Ambulatory Visit: Payer: Self-pay

## 2019-12-04 ENCOUNTER — Encounter: Payer: Medicare Other | Admitting: Nutrition

## 2019-12-04 ENCOUNTER — Encounter: Payer: Self-pay | Admitting: Internal Medicine

## 2019-12-04 ENCOUNTER — Telehealth: Payer: Self-pay | Admitting: Nutrition

## 2019-12-04 ENCOUNTER — Ambulatory Visit (INDEPENDENT_AMBULATORY_CARE_PROVIDER_SITE_OTHER): Payer: Medicare Other | Admitting: Internal Medicine

## 2019-12-04 VITALS — BP 100/60 | HR 80 | Ht 67.0 in | Wt 157.0 lb

## 2019-12-04 DIAGNOSIS — E1065 Type 1 diabetes mellitus with hyperglycemia: Secondary | ICD-10-CM | POA: Diagnosis not present

## 2019-12-04 DIAGNOSIS — E1042 Type 1 diabetes mellitus with diabetic polyneuropathy: Secondary | ICD-10-CM | POA: Diagnosis not present

## 2019-12-04 LAB — POCT GLYCOSYLATED HEMOGLOBIN (HGB A1C): Hemoglobin A1C: 9.1 % — AB (ref 4.0–5.6)

## 2019-12-04 MED ORDER — NOVOLOG FLEXPEN 100 UNIT/ML ~~LOC~~ SOPN: PEN_INJECTOR | SUBCUTANEOUS | 11 refills | Status: DC

## 2019-12-04 MED ORDER — FREESTYLE LIBRE 14 DAY READER DEVI: 1.0000 | Freq: Once | 0 refills | Status: AC

## 2019-12-04 MED ORDER — FREESTYLE LIBRE 14 DAY SENSOR MISC: 1.0000 | 11 refills | Status: DC

## 2019-12-04 MED ORDER — GLUCAGON 3 MG/DOSE NA POWD: 3.0000 mg | Freq: Once | NASAL | 11 refills | Status: DC | PRN

## 2019-12-04 NOTE — Progress Notes (Signed)
Patient ID: [name], female   DOB: Dec 26, 1970, 49 y.o.   MRN: [id number]  This visit occurred during the SARS-CoV-2 public health emergency.  Safety protocols were in place, including screening questions prior to the visit, additional usage of staff PPE, and extensive cleaning of exam room while observing appropriate contact time as indicated for disinfecting solutions.   HPI: [name] is a 49 y.o.-year-old female, returning for f/u for DM1, dx in 26 (49 y/o), uncontrolled, with complications (cerebro-vascular ds - h/o TIA 0/2016, PN, DR). Last visit 3 months ago. Insurance: [employer].  Since last visit, she was in the emergency room for multiple symptoms, including intermittent chest pain.  She saw cardiology 11/19/2019.  Reviewed HbA1c levels: Lab Results  Component Value Date   HGBA1C 10.2 (A) 08/31/2019   HGBA1C 9.2 (A) 05/01/2019   HGBA1C 8.7 (A) 12/26/2018   She was previously on an Omni pod, and then on Medtronic 670 G insulin pump but she could not afford the Enlite CGM. She is now back on an insulin pump - T:slim X 2+ Dexcom CGM-started 10/2019.  However, she came off the pump approximately 2 weeks ago, after she lost her sensor transmitter. Sugars improved after her insurance switched her from Humalog to NovoLog.  Previous regimen: - Pump settings:  - basal rates: 12 am: 0.70 units/h >> 0.80 8 am: 0.80 - ICR:   12 am: 9   1 pm: 9 - target: 130-130 - ISF: 45 - Insulin on Board: 4h - bolus wizard: on - bolus wizard: on TDD from basal insulin: 62% (18 units) TDD from bolus insulin: 38% (11 units) - extended bolusing: not using - changes infusion site: q3 days - Meter: Omnipod meter  At last visit she was on: - Lantus  20 >> 25 units in am and 15 >> 10 at night - Novolog: -6-8 >> 8-10 units before a breakfast -10-14 >> 2-3 units before lunch, if you have a small meal -14-16 units before dinner - Sliding scale Novolog:  150-200: + 1  unit 201-250: + 2 units >250: + 3 units  She uses on the following pump settings: - basal rates: 12 am: 1 >> 0.8 unit/h - ICR:   12 am: 1:8 - target: 130-130 >> 100-100 - ISF: 35 - Insulin on Board: 3 >> 5h  She checks her sugars more than 4 times a day with the Dexcom CGM, but this could not be downloaded today. She checks with the Relion meter-sugars improved significantly while on pump-see below (but increased since stopping the pump 2 weeks ago): - am: 80-140 - 2h after b'fast: n/c - lunch: 40-80 - 2h after lunch: n/c - dinner: 120-200s - 2h after dinner: 80-140 - bedtime: see above  Previously: CGM parameters: - average: 234+/-68 >> 211 +/- 77.7% >> 209 >> 240 >> 259 - Glucose variability: 39.8% >> 39.4% >> 31.6% (ideal less than 36%) - Percentage time CGM active: 98% >> 92% - time in range:  - very low (<54): 0% >> 1% >> 0% - low (54-69): 3% >> 2% >> 1% - normal range (70-180): 37% >> 24% >> 16% - high sugars (181-250): 26% >> 26% >> 28% - very high sugars (>250): 34% >> 47% >> 55%  Previously:   Previously:   Lowest sugar was 27 in 07/2014... 40s >> 53 >> 40.  She does not have hypoglycemia awareness! Highest sugar was 601 .. >> 1200 (!!!) ...>> 400s >> . No history of hypoglycemia  admissions.  She had a DKA admission in 09/2018.  She also had hyperglycemia ED visits and an admission in 12/2017.  -No CKD, last BUN/creatinine:  Lab Results  Component Value Date   BUN 13 11/16/2019   CREATININE 0.62 11/16/2019   -+ HL; last set of lipids: Lab Results  Component Value Date   CHOL 156 12/26/2018   HDL 44.80 12/26/2018   LDLCALC 94 12/26/2018   TRIG 86.0 12/26/2018   CHOLHDL 3 12/26/2018  On pravastatin 10. - last eye exam was in 01/2019: Reportedly + DR -+ Numbness and tingling in feet and at last visit she was also complaining of this in hands.  She was previously on Neurontin but she stopped due to side effects.  Currently on Lyrica.  Previous  vitamin B12 levels: Lab Results  Component Value Date   VITAMINB12 >1500 (H) 12/26/2018   VITAMINB12 481 12/06/2014  She did decrease the dose from 3 to 2 tablets a day after the above results returned.  No history of hypothyroidism: Lab Results  Component Value Date   TSH 2.15 12/26/2018   On Diazepam.  Off Wellbutrin because of constipation.  She had tooth surgery in 01/2018, Dr. Britta Mccreedy.  She also had bilateral big toe surgery in 09/2016 with Dr. Amalia Hailey.  ROS: Constitutional: no weight gain/no weight loss, no fatigue, no subjective hyperthermia, no subjective hypothermia Eyes: no blurry vision, no xerophthalmia ENT: no sore throat, no nodules palpated in neck, no dysphagia, no odynophagia, no hoarseness Cardiovascular: no CP/no SOB/no palpitations/no leg swelling Respiratory: no cough/no SOB/no wheezing Gastrointestinal: no N/no V/no D/no C/no acid reflux Musculoskeletal: + muscle aches/+ joint aches (back) Skin: no rashes, no hair loss Neurological: no tremors/no numbness/no tingling/no dizziness  I reviewed pt's medications, allergies, PMH, social hx, family hx, and changes were documented in the history of present illness. Otherwise, unchanged from my initial visit note.  Past Medical History:  Diagnosis Date  . Benign paroxysmal vertigo   . Cervicalgia   . Depression   . Diabetes ([place])    Type1  . Diabetes mellitus without complication ([place])   . GERD (gastroesophageal reflux disease)   . Hiatal hernia   . History of cardiac catheterization    Normal coronary arteries 2016  . History of TIA (transient ischemic attack)   . Hyperlipidemia   . Hypertension   . IBS (irritable bowel syndrome)   . Migraine headache   . Panic disorder with agoraphobia   . Peripheral neuropathy   . Subungual hematoma of digit of hand   . Tendonitis   . Vertigo    Past Surgical History:  Procedure Laterality Date  . ABDOMINAL HYSTERECTOMY    . APPENDECTOMY    . CARDIAC  CATHETERIZATION   last in 2009   x 4, normal coronary arteries  . CARDIAC CATHETERIZATION N/A 03/26/2015   Procedure: Left Heart Cath and Coronary Angiography;  Surgeon: Wellington Hampshire, MD;  Location: [facility];  Service: Cardiovascular;  Laterality: N/A;  . CARPAL TUNNEL RELEASE Right 08/24/2018   Procedure: CARPAL TUNNEL RELEASE;  Surgeon: Carole Civil, MD;  Location: [facility];  Service: Orthopedics;  Laterality: Right;  . CESAREAN SECTION    . CHOLECYSTECTOMY    . COLONOSCOPY WITH PROPOFOL N/A 01/09/2018   Procedure: COLONOSCOPY WITH PROPOFOL;  Surgeon: Rogene Houston, MD;  Location: [facility];  Service: Endoscopy;  Laterality: N/A;  . COLONOSCOPY WITH PROPOFOL N/A 09/21/2019   Procedure: COLONOSCOPY WITH PROPOFOL;  Surgeon: Laural Golden,  Mechele Dawley, MD;  Location: [facility];  Service: Endoscopy;  Laterality: N/A;  1055  . CYST EXCISION     right breast  . ESOPHAGOGASTRODUODENOSCOPY (EGD) WITH PROPOFOL N/A 01/09/2018   Procedure: ESOPHAGOGASTRODUODENOSCOPY (EGD) WITH PROPOFOL;  Surgeon: Rogene Houston, MD;  [facility];  Service: Endoscopy;  Laterality: N/A;  . POLYPECTOMY  09/21/2019   Procedure: POLYPECTOMY;  Surgeon: Rogene Houston, MD;  Location[facility];  Service: Endoscopy;;  ascending colon;  . 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 TUBAL LIGATION     History   Social History  . Marital Status: Divorced    Spouse Name: N/A  . Number of Children: 1   Occupational History  . disabled   Social History Main Topics  . Smoking status: Current Every Day Smoker -- 0.50 packs/day for 11 years  . Smokeless tobacco: Never Used     Comment: patient is aware that she needs to quit smoking  . Alcohol Use: No  . Drug Use: No   Current Outpatient Medications  on File Prior to Visit  Medication Sig Dispense Refill  . aspirin EC 81 MG tablet Take 1 tablet (81 mg total) by mouth daily. 90 tablet 3  . cholecalciferol (VITAMIN D3) 25 MCG (1000 UT) tablet Take 1,000 Units by mouth daily.    . diazepam (VALIUM) 5 MG tablet Take 5 mg by mouth every 8 (eight) hours as needed for anxiety.    . dicyclomine (BENTYL) 10 MG capsule Take 1 capsule (10 mg total) by mouth 3 (three) times daily as needed (abd pain). 30 capsule 1  . docusate sodium (COLACE) 100 MG capsule Take 100-200 mg by mouth See admin instructions. Take 100 mg by mouth in the morning and 200 mg in the evening    . EPIPEN 2-PAK 0.3 MG/0.3ML SOAJ injection Inject 0.3 mg into the muscle as needed for anaphylaxis.     Marland Kitchen estradiol (ESTRACE) 1 MG tablet Take 1 mg by mouth daily.    Marland Kitchen GLUCAGEN HYPOKIT 1 MG SOLR injection Inject 1 mg into the skin once as needed for low blood sugar.     . ibuprofen (ADVIL) 400 MG tablet Take 400 mg by mouth 2 (two) times daily as needed for headache or moderate pain.     Marland Kitchen insulin aspart (NOVOLOG) 100 UNIT/ML injection Inject 10-18 Units into the skin 3 (three) times daily with meals. 48.6 mL 1  . LANTUS SOLOSTAR 100 UNIT/ML Solostar Pen Inject 25 units in am and 10 units at night (Patient taking differently: Inject 10-25 Units into the skin See admin instructions. Inject 25 units in am and 10 units at night) 30 mL 3  . linaclotide (LINZESS) 145 MCG CAPS capsule Take 1 capsule (145 mcg total) by mouth daily. 30 capsule 5  . lisinopril (PRINIVIL,ZESTRIL) 5 MG tablet Take 5 mg by mouth daily.  2  . Melatonin 5 MG TABS Take 10 mg by mouth at bedtime as needed (sleep).     . nitroGLYCERIN (NITROSTAT) 0.4 MG SL tablet Place 1 tablet (0.4 mg total) under the tongue every 5 (five) minutes x 3 doses as needed for chest pain (if no relief after 3rd dose, proceed to the ED for an evaluation or call 911). 25 tablet 3  . Omega-3 Fatty  Acids (FISH OIL) 1000 MG CAPS Take 1,000 mg by mouth  daily.    . polycarbophil (FIBERCON) 625 MG tablet Take 625 mg by mouth daily.    . pravastatin (PRAVACHOL) 10 MG tablet Take 10 mg by mouth at bedtime.  2  . pregabalin (LYRICA) 300 MG capsule Take 300 mg by mouth 2 (two) times daily.    . promethazine (PHENERGAN) 25 MG tablet Take 25 mg by mouth every 6 (six) hours as needed for nausea/vomiting.    Marland Kitchen tiZANidine (ZANAFLEX) 4 MG tablet Take 4 mg by mouth every 8 (eight) hours as needed for muscle spasms.      No current facility-administered medications on file prior to visit.   Allergies  Allergen Reactions  . Bee Venom Anaphylaxis and Hives  . Zocor [Simvastatin] Other (See Comments)    Muscle aches and pains  . Clindamycin/Lincomycin Rash  . Codeine Rash  . Penicillins Rash    Did it involve swelling of the face/tongue/throat, SOB, or low BP? No Did it involve sudden or severe rash/hives, skin peeling, or any reaction on the inside of your mouth or nose? Yes Did you need to seek medical attention at a hospital or doctor's office? Yes When did it last happen?30 years If all above answers are "NO", may proceed with cephalosporin use.   . Sulfa Antibiotics Rash        Family History  Problem Relation Age of Onset  . Stroke Mother 53  . Heart attack Mother 53  . Cancer Sister        colon  . Heart failure Maternal Grandmother 65  . Cancer Maternal Grandfather        prostate  . Heart failure Paternal Grandmother 54  . Heart failure Paternal Grandfather 60   PE: BP 100/60   Pulse 80   Ht 5\' 7"  (1.702 m)   Wt 157 lb (71.2 kg)   SpO2 95%   BMI 24.59 kg/m  Body mass index is 24.59 kg/m. Wt Readings from Last 3 Encounters:  12/04/19 157 lb (71.2 kg)  11/19/19 162 lb 3.2 oz (73.6 kg)  11/16/19 156 lb (70.8 kg)   Constitutional: normal weight, in NAD Eyes: PERRLA, EOMI, no exophthalmos ENT: moist mucous membranes, no thyromegaly, no cervical lymphadenopathy Cardiovascular: RRR, No MRG Respiratory: CTA  B Gastrointestinal: abdomen soft, NT, ND, BS+ Musculoskeletal: no deformities, strength intact in all 4 Skin: moist, warm, no rashes Neurological: no tremor with outstretched hands, DTR normal in all 4  ASSESSMENT: 1. DM1, insulin-dependent, uncontrolled, with complications - cerebro-vascular ds - h/o TIA 0/2016 - PN - DR Sees cardiology >> Dr. Maurice Small - [facility]  2. HL  3.  High B12  PLAN:  1. Patient with longstanding, uncontrolled, type 1 diabetes, previously on an insulin pump, then off the last few visits after an episode of DKA in 09/2018 after suspicion of stroke (however, this was ruled out by MRI; also had normal carotid Dopplers afterwards).  Since last visit, she restarted on an insulin pump as her diabetes control was much worse on basal-bolus insulin regimen.  At last visit sugars were very high with occasional low blood sugars when she overcorrected high blood sugar. -She started the t:slim insulin pump in 10/2019. -Her sugars improved significantly after she started the pump, however, approximately 2 weeks ago she had to take the sensor off to have an MRI and the transmitter was lost.  She called the company and she is being sent another one.  Unfortunately, she also stopped the pump when her sensor came off. She is now only doing injections and did not attempt to attach the pump without the CGM.  She does have an appointment with the diabetes educator today and I advised her to restart the pump as soon as possible and she can always attach the CGM later.  She was in the control IQ mode before, which I am hoping she can restart. -For now, if she restarts the pump, we discussed about several changes in her settings.  Since she is dropping her sugars significantly before lunch and she feels that this is related to basal rates, will go ahead and decrease her basal rates from 12 AM to 2 PM.  She feels that her sugars are increasing after 2 PM so we will not change the basal  rate after this time.  I advised her that if the sugars remain low before lunch after this, she can relax her insulin to carb ratio with breakfast.  We also changed her active insulin time from 5 hours to 3 hours.  I do not feel we need to make any other changes for now.  The main goal for now is to get back on the pump and CGM. - I suggested to:  Patient Instructions  Please change: - basal rates: 12 am: 0.8 >> 0.7 unit/h 2 pm: 0.8 - ICR:   12 am: 1:8 (if the sugars before lunch are still low, change the ICR with breakfast    to 1:10) - target: 100-100 - ISF: 35 - Active insulin time: 5h >> 3h  Come to see Vaughan Basta tomorrow and attach the pump.  Please return in 3 months.   - we checked her HbA1c: 9.1% (slightly better) - advised to check sugars at different times of the day - 4x a day, rotating check times - advised for yearly eye exams >> she is UTD - return to clinic in 3 months   2. HL - Reviewed latest lipid panel from 12/2018: LDL above goal of less than 70, the rest of the fractions at goal Lab Results  Component Value Date   CHOL 156 12/26/2018   HDL 44.80 12/26/2018   LDLCALC 94 12/26/2018   TRIG 86.0 12/26/2018   CHOLHDL 3 12/26/2018  - Continues the statin without side effects. - She is due for another lipid panel >> will check at next visit  3.  High B12 -We checked her B12 vitamin in the setting of hand numbness.  This returned elevated >> we decreased her supplements dose. She actually stopped since last OV -We will recheck a B12 level at next visit  Philemon Kingdom, MD PhD [facility] Endocrinology

## 2019-12-04 NOTE — Telephone Encounter (Signed)
Message left on machine of times I am available tomorrow or next week.  Left number to call me back.

## 2019-12-04 NOTE — Patient Instructions (Signed)
Please change: - basal rates: 12 am: 0.8 >> 0.7 unit/h 2 pm: 0.8 - ICR:   12 am: 1:8 (if the sugars before lunch are still low, change the ICR with breakfast    to 1:10) - target: 100-100 - ISF: 35 - Active insulin time: 5h >> 3h  Come to see Vaughan Basta tomorrow and attach the pump.  Please return in 3 months.

## 2019-12-05 ENCOUNTER — Telehealth: Payer: Self-pay | Admitting: Internal Medicine

## 2019-12-05 NOTE — Telephone Encounter (Signed)
[place] called back and I advised her of note below from Dr. Cruzita Lederer and she stated she will fax the PA request over-FYI

## 2019-12-05 NOTE — Telephone Encounter (Signed)
Let's try a PA - she had high blood sugars in the past on Humalog

## 2019-12-05 NOTE — Telephone Encounter (Signed)
[place] called to advise that per the insurance company Novolog will require a PA, but that Humalog is a covered medication for this patient. [place] was calling to ask about switching the medication.

## 2019-12-07 ENCOUNTER — Telehealth: Payer: Self-pay

## 2019-12-07 NOTE — Telephone Encounter (Signed)
Prior authorization for Novolog has been approved by patient's insurance.  Request Reference Number: [id number].   NOVOLOG INJ FLEXPEN is approved through 08/01/2020. Your patient may now fill this prescription and it will be covered.

## 2019-12-07 NOTE — Telephone Encounter (Addendum)
Per verbal discussion with Dr. Cruzita Lederer, patient did want the pens.  PA submitted through CoverMyMeds.   [employer] is reviewing your PA request. Typically an electronic response will be received within 72 hours.

## 2019-12-07 NOTE — Telephone Encounter (Signed)
Never received form from pharmacy so I have started the request via CoverMyMeds.  RX sent was for Novolog Flex pen, does this need changed to vials due to her using a pump?

## 2019-12-07 NOTE — Telephone Encounter (Signed)
Patient called checking on status of her insulin - I explained process for the PA and she is concerned because she will be out of insulin after today. Please advise. [phone]

## 2019-12-11 ENCOUNTER — Telehealth: Payer: Self-pay | Admitting: Nutrition

## 2019-12-11 NOTE — Telephone Encounter (Signed)
Message left on machine with appointment times to finish pump training for tomorrow and Monday.  She was told that I will be out the rest of next week.

## 2019-12-12 ENCOUNTER — Telehealth: Payer: Self-pay | Admitting: Nutrition

## 2019-12-12 NOTE — Telephone Encounter (Signed)
Patient called to reschedule her 2nd pump training, and she told me that she let the battery go dead on the pump and it lost all of it's settings.  She was told that this would not happen, and only the date/time need to be reset.  She went back on her long and short-acting insulin and said she will stay with this until her appointment with me on Monday

## 2019-12-17 ENCOUNTER — Encounter: Payer: Medicare Other | Admitting: Nutrition

## 2019-12-20 ENCOUNTER — Other Ambulatory Visit: Payer: Self-pay

## 2019-12-20 ENCOUNTER — Encounter: Payer: Self-pay | Admitting: Family Medicine

## 2019-12-20 ENCOUNTER — Telehealth: Payer: Self-pay | Admitting: Family Medicine

## 2019-12-20 ENCOUNTER — Ambulatory Visit (INDEPENDENT_AMBULATORY_CARE_PROVIDER_SITE_OTHER): Payer: Medicare Other | Admitting: Family Medicine

## 2019-12-20 VITALS — BP 104/64 | HR 71 | Ht 67.0 in | Wt 162.0 lb

## 2019-12-20 DIAGNOSIS — R002 Palpitations: Secondary | ICD-10-CM | POA: Diagnosis not present

## 2019-12-20 NOTE — Progress Notes (Addendum)
Cardiology Office Note  Date: 12/20/2019   ID: [name], DOB 28-Nov-1970, MRN [id number]  PCP:  Denny Levy, PA  Cardiologist:  Rozann Lesches, MD Electrophysiologist:  None   Chief Complaint: F/U HTN, HLD, DM II,   History of Present Illness: [name] is a 49 y.o. female with a history of  HTN, HLD, DM II.  Recent ER visit  11/16/2019 for c/o of intermittent chest pain, SOB, bilateral leg edema, and fatigue. EKG was negative for acute abnormalities, CXR no acute changes. BNP was negative, troponins were negative.  Last saw Dr. Domenic Polite on 05/03/2018 for complaints of chest pain during an ER visit.  At that time she complained of left-sided chest discomfort with some radiation into the left shoulder and upper arm not precipitated by activity.  Also had some relative fluctuations in blood pressure and heart rate at home.  She had a previous consultation with Dr. Harl Bowie back in 2016 related to chest pain.  She had a subsequent cardiac catheterization at that time which revealed normal coronary arteries.  Patient continues complain of some swelling in her lower extremities.  Feeling dizzy at times tired with some chest discomfort.  Complaining of some left leg pain.  He states recently she has had some palpitations.  She had an incident where she states she felt like her heart was going to beat out of her chest and she had chest pain and associated dizziness.  She denies any CVA or TIA-like symptoms, PND/orthopnea.  Claudication-like symptoms, DVT or PE-like symptoms.  He does have significant issues with diabetes and a long history since age 29.   Past Medical History:  Diagnosis Date  . Benign paroxysmal vertigo   . Cervicalgia   . Depression   . Diabetes ([place])    Type1  . Diabetes mellitus without complication ([place])   . GERD (gastroesophageal reflux disease)   . Hiatal hernia   . History of cardiac catheterization    Normal coronary arteries 2016  . History of  TIA (transient ischemic attack)   . Hyperlipidemia   . Hypertension   . IBS (irritable bowel syndrome)   . Migraine headache   . Panic disorder with agoraphobia   . Peripheral neuropathy   . Subungual hematoma of digit of hand   . Tendonitis   . Vertigo     Past Surgical History:  Procedure Laterality Date  . ABDOMINAL HYSTERECTOMY    . APPENDECTOMY    . CARDIAC CATHETERIZATION   last in 2009   x 4, normal coronary arteries  . CARDIAC CATHETERIZATION N/A 03/26/2015   Procedure: Left Heart Cath and Coronary Angiography;  Surgeon: Wellington Hampshire, MD;  Location: [facility];  Service: Cardiovascular;  Laterality: N/A;  . CARPAL TUNNEL RELEASE Right 08/24/2018   Procedure: CARPAL TUNNEL RELEASE;  Surgeon: Carole Civil, MD;  Location: [facility];  Service: Orthopedics;  Laterality: Right;  . CESAREAN SECTION    . CHOLECYSTECTOMY    . COLONOSCOPY WITH PROPOFOL N/A 01/09/2018   Procedure: COLONOSCOPY WITH PROPOFOL;  Surgeon: Rogene Houston, MD;  Location: [facility];  Service: Endoscopy;  Laterality: N/A;  . COLONOSCOPY WITH PROPOFOL N/A 09/21/2019   Procedure: COLONOSCOPY WITH PROPOFOL;  Surgeon: Rogene Houston, MD;  Location: [facility];  Service: Endoscopy;  Laterality: N/A;  1055  . CYST EXCISION     right breast  . ESOPHAGOGASTRODUODENOSCOPY (EGD) WITH PROPOFOL N/A 01/09/2018   Procedure: ESOPHAGOGASTRODUODENOSCOPY (EGD) WITH PROPOFOL;  Surgeon: Rogene Houston, MD;  Location: [facility];  Service: Endoscopy;  Laterality: N/A;  . POLYPECTOMY  09/21/2019   Procedure: POLYPECTOMY;  Surgeon: Rogene Houston, MD;  Location: [facility];  Service: Endoscopy;;  ascending colon;  . 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 TUBAL LIGATION      Current Outpatient Medications   Medication Sig Dispense Refill  . aspirin EC 81 MG tablet Take 1 tablet (81 mg total) by mouth daily. 90 tablet 3  . cholecalciferol (VITAMIN D3) 25 MCG (1000 UT) tablet Take 1,000 Units by mouth daily.    . Continuous Blood Gluc Sensor (FREESTYLE LIBRE 14 DAY SENSOR) MISC 1 each by Does not apply route every 14 (fourteen) days. Change every 2 weeks 2 each 11  . diazepam (VALIUM) 5 MG tablet Take 5 mg by mouth every 8 (eight) hours as needed for anxiety.    . dicyclomine (BENTYL) 10 MG capsule Take 1 capsule (10 mg total) by mouth 3 (three) times daily as needed (abd pain). 30 capsule 1  . docusate sodium (COLACE) 100 MG capsule Take 100-200 mg by mouth See admin instructions. Take 100 mg by mouth in the morning and 200 mg in the evening    . EPIPEN 2-PAK 0.3 MG/0.3ML SOAJ injection Inject 0.3 mg into the muscle as needed for anaphylaxis.     Marland Kitchen estradiol (ESTRACE) 1 MG tablet Take 1 mg by mouth daily.    Marland Kitchen GLUCAGEN HYPOKIT 1 MG SOLR injection Inject 1 mg into the skin once as needed for low blood sugar.     . Glucagon 3 MG/DOSE POWD Place 3 mg into the nose once as needed for up to 1 dose. 1 each 11  . ibuprofen (ADVIL) 400 MG tablet Take 400 mg by mouth 2 (two) times daily as needed for headache or moderate pain.     Marland Kitchen insulin aspart (NOVOLOG FLEXPEN) 100 UNIT/ML FlexPen Use up to 70 units a day 30 mL 11  . LANTUS SOLOSTAR 100 UNIT/ML Solostar Pen Inject 25 units in am and 10 units at night (Patient taking differently: Inject 10-25 Units into the skin See admin instructions. Inject 25 units in am and 10 units at night) 30 mL 3  . linaclotide (LINZESS) 145 MCG CAPS capsule Take 1 capsule (145 mcg total) by mouth daily. 30 capsule 5  . lisinopril (PRINIVIL,ZESTRIL) 5 MG tablet Take 5 mg by mouth daily.  2  . Melatonin 5 MG TABS Take 10 mg by mouth at bedtime as needed (sleep).     . nitroGLYCERIN (NITROSTAT) 0.4 MG SL tablet Place 1 tablet (0.4 mg total) under the tongue every 5 (five) minutes x 3  doses as needed for chest pain (if no relief after 3rd dose, proceed to the ED for an evaluation or call 911). 25 tablet 3  . Omega-3 Fatty Acids (FISH OIL) 1000 MG CAPS Take 1,000 mg by mouth daily.    . polycarbophil (FIBERCON) 625 MG tablet Take 625 mg by mouth daily.    . pravastatin (PRAVACHOL) 10 MG tablet Take 10 mg by mouth at bedtime.  2  . pregabalin (LYRICA) 300 MG capsule Take 300 mg by mouth 2 (two) times daily.    . promethazine (PHENERGAN) 25 MG tablet Take 25 mg by mouth every 6 (  six) hours as needed for nausea/vomiting.    Marland Kitchen tiZANidine (ZANAFLEX) 4 MG tablet Take 4 mg by mouth every 8 (eight) hours as needed for muscle spasms.      No current facility-administered medications for this visit.   Allergies:  Bee venom, Zocor [simvastatin], Clindamycin/lincomycin, Codeine, Penicillins, and Sulfa antibiotics   Social History: The patient  reports that she has been smoking cigarettes. She has a 5.50 pack-year smoking history. She has never used smokeless tobacco. She reports current alcohol use. She reports that she does not use drugs.   Family History: The patient's family history includes Cancer in her maternal grandfather and sister; Heart attack (age of onset: 33) in her mother; Heart failure (age of onset: 85) in her paternal grandfather; Heart failure (age of onset: 51) in her maternal grandmother; Heart failure (age of onset: 3) in her paternal grandmother; Stroke (age of onset: 62) in her mother.   ROS:  Please see the history of present illness. Otherwise, complete review of systems is positive for none.  All other systems are reviewed and negative.   Physical Exam: VS:  BP 104/64   Pulse 71   Ht 5\' 7"  (1.702 m)   Wt 162 lb (73.5 kg)   SpO2 98%   BMI 25.37 kg/m , BMI Body mass index is 25.37 kg/m.  Wt Readings from Last 3 Encounters:  12/20/19 162 lb (73.5 kg)  12/04/19 157 lb (71.2 kg)  11/19/19 162 lb 3.2 oz (73.6 kg)    General: Patient appears comfortable at  rest. Neck: Supple, no elevated JVP or carotid bruits, no thyromegaly. Lungs: Clear to auscultation, nonlabored breathing at rest. Cardiac: Regular rate and rhythm, no S3 or significant systolic murmur, no pericardial rub. Extremities: No pitting edema, distal pulses 2+. Skin: Warm and dry. Musculoskeletal: No kyphosis. Neuropsychiatric: Alert and oriented x3, affect grossly appropriate.  ECG:   Recent EKG on 11/16/2019 at the emergency department showed normal sinus rhythm rate of 80, right atrial enlargement, rightward access.  Recent Labwork: 12/26/2018: TSH 2.15 11/16/2019: ALT 20; AST 21; B Natriuretic Peptide 28.0; BUN 13; Creatinine, Ser 0.62; Hemoglobin 14.3; Platelets 186; Potassium 4.0; Sodium 139     Component Value Date/Time   CHOL 156 12/26/2018 1350   TRIG 86.0 12/26/2018 1350   HDL 44.80 12/26/2018 1350   CHOLHDL 3 12/26/2018 1350   VLDL 17.2 12/26/2018 1350   LDLCALC 94 12/26/2018 1350    Other Studies Reviewed Today:  Nuclear stress test 11/29/2019  There was no ST segment deviation noted during stress.  The study is normal. There are no perfusion defects  This is a low risk study.  The left ventricular ejection fraction is normal (55-65%).   Echocardiogram 11/29/2019 1. Left ventricular ejection fraction, by estimation, is 60 to 65%. The left ventricle has normal function. The left ventricle has no regional wall motion abnormalities. Left ventricular diastolic parameters were normal. 2. Right ventricular systolic function is normal. The right ventricular size is normal. 3. The mitral valve is normal in structure. No evidence of mitral valve regurgitation. No evidence of mitral stenosis. 4. The aortic valve is tricuspid. Aortic valve regurgitation is not visualized. No aortic stenosis is present. 5. The inferior vena cava is normal in size with greater than 50% respiratory variability, suggesting right atrial pressure of 3 mmHg.    Cardiac  catheterization 03/26/2015: 1. Normal coronary arteries in a left dominant system.  2. Normal LV systolic function and left ventricular end-diastolic pressure.  Recommendations: The  chest pain does not seem to be cardiac. Continue aggressive treatment of risk factors.  Echocardiogram 08/18/2014: Study Conclusions  - Left ventricle: The cavity size was normal. Systolic function was normal. The estimated ejection fraction was in the range of 55% to 60%. Wall motion was normal; there were no regional wall motion abnormalities. - Aortic valve: Valve area (Vmax): 1.98 cm^2.  Impressions:  - No cardiac source of emboli was indentified.  Lower Extremity Venous Duplex for LLE pain IMPRESSION: No femoropopliteal DVT nor evidence of DVT within the visualized calf veins.  If clinical symptoms are inconsistent or if there are persistent or worsening symptoms, further imaging (possibly involving the iliac veins) may be warranted.   Assessment and Plan:   1. Atypical chest pain Continues with chest pain now associated with palpitations per patient patient states she had an episode where she felt like her heart was beating out of her chest and she had associated chest pain and some dizziness.  Her stress test was normal and had no significant perfusion defects.  No ST segment deviation on EKG.  Her EF was 55 to 65% on stress.  2. Mixed hyperlipidemia Recent lipid panel Dec 26, 2018 showed TC 146, TG 86, HDL 44, LDL 94.  Continue Pravachol 10 mg daily.  3. Type 2 diabetes mellitus without complication, with long-term current use of insulin (HCC) Patient's had a long history of diabetes since age 23.  She has previously been on insulin pumps in the past.  Managed by PCP  4.  Dyspnea Patient  had been having increased dyspnea on exertion along with associated chest pain when performing exertional activity.  Also complained of recent lower extremity edema.  Her recent echocardiogram  was normal.  5.  Hypertension. Blood pressures well controlled on current medication.  Blood pressure today 104/64.  Continue lisinopril 5 mg p.o. daily  6.  Smoking Patient has 17 years history of smoking.  Highly advised patient to stop smoking given her other risk factors for heart disease.  Patient states she knows she  needs to quit.  7.  Palpitations Patient states she is recently had an episode where she felt like her heart rate was fast and beating out of her chest.  She voiced associated chest pain and dizziness with this episode.  States she has had palpitations in the past.  Please get a 14-day ZIO monitor   Medication Adjustments/Labs and Tests Ordered: Current medicines are reviewed at length with the patient today.  Concerns regarding medicines are outlined above.   Disposition: Follow-up with Dr. Domenic Polite or APP 2 months Signed, Levell July, NP 12/20/2019 3:56 PM    [facility] [place], [place], [place] [address] Phone: [phone]; Fax: [phone]

## 2019-12-20 NOTE — Patient Instructions (Addendum)
Medication Instructions:   Your physician recommends that you continue on your current medications as directed. Please refer to the Current Medication list given to you today.  Labwork:  NONE  Testing/Procedures: Your physician has recommended that you wear an event monitor for 14 days. Event monitors are medical devices that record the heart's electrical activity. Doctors most often [place] these monitors to diagnose arrhythmias. Arrhythmias are problems with the speed or rhythm of the heartbeat. The monitor is a small, portable device. You can wear one while you do your normal daily activities. This is usually used to diagnose what is causing palpitations/syncope (passing out). [employer] will contact you about this monitor.  Follow-Up:  Your physician recommends that you schedule a follow-up appointment in: 2 months (office).  Any Other Special Instructions Will Be Listed Below (If Applicable).  If you need a refill on your cardiac medications before your next appointment, please call your pharmacy.

## 2019-12-20 NOTE — Telephone Encounter (Signed)
Pre-cert Verification for the following procedure    14 Day ZIO AT dx: palpitations & dizziness

## 2020-01-01 ENCOUNTER — Ambulatory Visit: Payer: Medicare Other | Admitting: Cardiology

## 2020-01-09 ENCOUNTER — Other Ambulatory Visit (HOSPITAL_COMMUNITY): Payer: Self-pay | Admitting: Neurology

## 2020-01-09 ENCOUNTER — Other Ambulatory Visit: Payer: Self-pay | Admitting: Neurology

## 2020-01-09 DIAGNOSIS — M5416 Radiculopathy, lumbar region: Secondary | ICD-10-CM

## 2020-01-15 ENCOUNTER — Encounter: Payer: Self-pay | Admitting: Internal Medicine

## 2020-01-15 LAB — HM DIABETES EYE EXAM

## 2020-01-16 ENCOUNTER — Telehealth: Payer: Self-pay | Admitting: Nutrition

## 2020-01-16 NOTE — Telephone Encounter (Signed)
Message left on machine to call for appointment to restart her pump

## 2020-01-23 ENCOUNTER — Encounter: Payer: Medicare Other | Attending: Internal Medicine | Admitting: Nutrition

## 2020-01-23 DIAGNOSIS — E1065 Type 1 diabetes mellitus with hyperglycemia: Secondary | ICD-10-CM | POA: Insufficient documentation

## 2020-01-23 DIAGNOSIS — E1042 Type 1 diabetes mellitus with diabetic polyneuropathy: Secondary | ICD-10-CM | POA: Insufficient documentation

## 2020-01-24 ENCOUNTER — Telehealth: Payer: Self-pay | Admitting: Nutrition

## 2020-01-24 ENCOUNTER — Other Ambulatory Visit: Payer: Self-pay

## 2020-01-24 NOTE — Telephone Encounter (Addendum)
Patient reports that she has eaten nothing since breakfast at 9 AM (no insulin given for this).  Blood sugar dropped to 67 at 2PM, and she drank some orange juice.  Blood sugar is now 97 at 4PM.

## 2020-01-24 NOTE — Telephone Encounter (Signed)
After receiving a phone call from Dr. Cruzita Lederer, with pump setting changes that were revervabalized back to her, I phoned the patient.   She reports that her blood sugars started to drop at 10PM last night, 4 hours after supper.  Her FBS this AM was 50.  Said she ate toast and drank 4 ounces of juice without any insulin 2 hours ago, and blood sugar is now 208.   I talked her through making the changes to her pump settings:  I/C ratio 14, and ISF: 45.   I willl call her at 4PM to see how she is doing.  She was told to call the office if blood sugars drop low today.  She agreed to do this

## 2020-01-24 NOTE — Telephone Encounter (Signed)
Patient was talked into making the changes to her basal rates as below, by 0.1u/hr.

## 2020-01-24 NOTE — Telephone Encounter (Signed)
Yes, let's do so. Thank you!

## 2020-01-24 NOTE — Progress Notes (Signed)
[name] was fully trained on the [employer] control IQ pump.  She reports that blood sugars have been running In the high 200s and 300s despite taking her insulin.  Says has no energy and is feeling tired all of the time.  Has left hip pain and says it is radiating down her left leg.  Is seeing another doctor for this  Says is in pain most of the time.   She did not take her long acting insulin last night or this AM.   Settings were put into there pump per last office note on 12/04/19.  Basal rate: MN 0.7, 2PM: 0.8, MN: 10 (says sugars were dropping before lunch), 1PM: 8,  ISF: 35, timing: 3 hours, and target 100.  She was put in Control IQ and started a sensor.  New transmitter number and sensor number were put into pump and 1 hour warm up period started.   We reviewed how to give a bolus, and other handouts:  Exercise, temp basal rates, filling a cartridge, giving a bolus, changing pump settings, high blood sugar protocols, status View, pump signals for Control IQ.   She filled a cartridge with 180 u of Novolog insulin, and attached the infusion set to her left abdomen without difficulty.  We reviewed all topics on the checklist, and she signed off as understanding all topics. She had no final questions.  I will call her tomorrow to see how she is doing,

## 2020-01-24 NOTE — Patient Instructions (Addendum)
Read over all handouts given and call if questions. Read over manual and call [employer] if having difficulties with sensor or pump usage. Change infusion sets every 3 days, Change sensor every 10 days, Change transmitter ever 3 months.

## 2020-01-25 NOTE — Telephone Encounter (Signed)
Patient reports no low blood sugars during the night.  Control IQ did not activate for lows during the night  FBS today was 130 at 10AM, and patient is very happy.  She was told to call the office if she has a low today, and to review the work sheet on how to change the cartridge/infusion set, for tomorrow.  She agreed to do this, and had no final questions.

## 2020-01-25 NOTE — Telephone Encounter (Signed)
Great! Thank you, Vaughan Basta!!

## 2020-01-28 ENCOUNTER — Telehealth: Payer: Self-pay | Admitting: Nutrition

## 2020-01-28 NOTE — Telephone Encounter (Signed)
Sent her a message:  How are you blood sugars doing?  Any low blood sugars over the weekend, or any real highs?  And were you able to change out your cartridge and infusion sets on Saturday?

## 2020-01-28 NOTE — Telephone Encounter (Signed)
Her text response: "They are doing good.  No lows, just one little high that was 233.  I didn't have an problems changing out everything.  Thank God for this pump!  I want to thank you so much for helping me.  God Bless you!!

## 2020-01-28 NOTE — Telephone Encounter (Signed)
If she is comfortable with this, and since she has been on the pump before and has experience with it, that is probably ok.

## 2020-01-29 ENCOUNTER — Other Ambulatory Visit: Payer: Self-pay

## 2020-01-29 ENCOUNTER — Ambulatory Visit (HOSPITAL_COMMUNITY)
Admission: RE | Admit: 2020-01-29 | Discharge: 2020-01-29 | Disposition: A | Discharge status: Home / Self Care | Payer: Medicare Other | Source: Ambulatory Visit | Attending: Neurology | Admitting: Neurology

## 2020-01-29 DIAGNOSIS — M5416 Radiculopathy, lumbar region: Secondary | ICD-10-CM | POA: Diagnosis not present

## 2020-02-08 ENCOUNTER — Other Ambulatory Visit: Payer: Self-pay

## 2020-02-08 ENCOUNTER — Other Ambulatory Visit (HOSPITAL_COMMUNITY): Payer: Self-pay | Admitting: Neurology

## 2020-02-08 ENCOUNTER — Ambulatory Visit (HOSPITAL_COMMUNITY)
Admission: RE | Admit: 2020-02-08 | Discharge: 2020-02-08 | Disposition: A | Discharge status: Home / Self Care | Payer: Medicare Other | Source: Ambulatory Visit | Attending: Neurology | Admitting: Neurology

## 2020-02-08 DIAGNOSIS — M25552 Pain in left hip: Secondary | ICD-10-CM | POA: Diagnosis present

## 2020-02-11 ENCOUNTER — Telehealth: Payer: Self-pay | Admitting: *Deleted

## 2020-02-11 NOTE — Telephone Encounter (Signed)
Pt says she hasn't been able to wear heart monitor since she has had other health problems and wanted to know if she should go ahead and schedule heart cath - having chest pain almost daily lasting 5-10 mins several times daily - c/o swelling in feet/ankles and has gained around 15lbs in the last month - pt scheduled for appt 7/15 with Katina Dung, NP and pt aware that if chest pain/symptoms escalate she would need ED evaluation

## 2020-02-12 ENCOUNTER — Encounter: Payer: Medicare Other | Attending: Internal Medicine | Admitting: Nutrition

## 2020-02-12 ENCOUNTER — Other Ambulatory Visit: Payer: Self-pay

## 2020-02-12 DIAGNOSIS — E1042 Type 1 diabetes mellitus with diabetic polyneuropathy: Secondary | ICD-10-CM | POA: Insufficient documentation

## 2020-02-12 DIAGNOSIS — E1065 Type 1 diabetes mellitus with hyperglycemia: Secondary | ICD-10-CM | POA: Insufficient documentation

## 2020-02-13 NOTE — Patient Instructions (Signed)
Attach small end of transmitter into sensor first and then press transmitter into sensor and listen for 2 clicks.

## 2020-02-13 NOTE — Progress Notes (Signed)
Patient is here with her friend, because she is not able to send readings from the Dexcom to her new phone, and has called the Dexcom people, as was well as [employer], and has gotten no help with this. I explained that her sensor/transmitter needs to be linked to her new phone, she was again show how to do this.  The transmitter was not inserted correctly into her sensor as well, and we reviewed the need to have the small end of the transmitter inserted into the sensor first, and then how to listen for the 2 clicks to make sure it is fully inserted into the sensor.  She reported good understanding of this, and had no final questions.  We inserted a new sensor into her upper right hip area without any difficulty and attached the sensor correctly.  She had no final questions.

## 2020-02-14 ENCOUNTER — Ambulatory Visit (INDEPENDENT_AMBULATORY_CARE_PROVIDER_SITE_OTHER): Payer: Medicare Other | Admitting: Family Medicine

## 2020-02-14 ENCOUNTER — Encounter: Payer: Self-pay | Admitting: Family Medicine

## 2020-02-14 VITALS — BP 100/60 | HR 80 | Ht 67.0 in | Wt 166.4 lb

## 2020-02-14 DIAGNOSIS — I10 Essential (primary) hypertension: Secondary | ICD-10-CM

## 2020-02-14 DIAGNOSIS — R0789 Other chest pain: Secondary | ICD-10-CM

## 2020-02-14 DIAGNOSIS — R0600 Dyspnea, unspecified: Secondary | ICD-10-CM | POA: Diagnosis not present

## 2020-02-14 DIAGNOSIS — Z794 Long term (current) use of insulin: Secondary | ICD-10-CM

## 2020-02-14 DIAGNOSIS — R002 Palpitations: Secondary | ICD-10-CM

## 2020-02-14 DIAGNOSIS — F17200 Nicotine dependence, unspecified, uncomplicated: Secondary | ICD-10-CM

## 2020-02-14 DIAGNOSIS — E782 Mixed hyperlipidemia: Secondary | ICD-10-CM | POA: Diagnosis not present

## 2020-02-14 DIAGNOSIS — E118 Type 2 diabetes mellitus with unspecified complications: Secondary | ICD-10-CM | POA: Diagnosis not present

## 2020-02-14 MED ORDER — ISOSORBIDE MONONITRATE ER 30 MG PO TB24
30.0000 mg | ORAL_TABLET | Freq: Every day | ORAL | 1 refills | Status: DC
Start: 2020-02-14 — End: 2021-10-08

## 2020-02-14 NOTE — H&P (View-Only) (Signed)
Cardiology Office Note  Date: 02/14/2020   ID: [name], DOB 12/19/1970, MRN [id number]  PCP:  Denny Levy, PA  Cardiologist:  Rozann Lesches, MD Electrophysiologist:  None   Chief Complaint: F/U HTN, HLD, DM II,   History of Present Illness: [name] is a 49 y.o. female with a history of  HTN, HLD, DM II.  Recent ER visit  11/16/2019 for c/o of intermittent chest pain, SOB, bilateral leg edema, and fatigue. EKG was negative for acute abnormalities, CXR no acute changes. BNP was negative, troponins were negative.  Last saw Dr. Domenic Polite on 05/03/2018 for complaints of chest pain during an ER visit.  At that time she complained of left-sided chest discomfort with some radiation into the left shoulder and upper arm not precipitated by activity.  Also had some relative fluctuations in blood pressure and heart rate at home.  She had a previous consultation with Dr. Harl Bowie back in 2016 related to chest pain.  She had a subsequent cardiac catheterization at that time which revealed normal coronary arteries.  Last visit with me on 12/20/2019 with complaints of palpitations.  Continued to complain of some swelling in her lower extremities.  Feeling dizzy at times tired with some chest discomfort.  Complaining of some left leg pain.  She stated  she had some palpitations.  She had an incident where she states she felt like her heart was going to beat out of her chest and she had chest pain and associated dizziness.  She denied any CVA or TIA-like symptoms, PND/orthopnea.  Claudication-like symptoms, DVT or PE-like symptoms.  She  does have significant issues with diabetes and a long history since age 49.  Recently saw a dietitian to help with better control of diabetes  Last visit she was ordered a Zio patch monitor.  She called on 02/11/2020 and spoke to Mrs. Ashworth CMA stating she had been unable to wear the heart monitor since she had other health problems wanted to know if she  could go ahead and schedule cardiac catheterization.  She stated she was having chest pain almost daily lasting 5 to 10 minutes several times daily.  Complaining of swelling in feet and ankles and gained approximately 15 pounds in last month.  Patient was made aware of chest pain/symptoms escalate she would need to go to the emergency room for evaluation.  Continues to complain of chest pain that is intermittent.  Associated with and without exertion.  States it sometimes radiates to her left arm.  Describes the pain as a squeezing-like pain.  Continues to complain of significant fatigue.  She continues to smoke in spite of multiple attempts to encourage cessation.  She denies any associated nausea, vomiting, diaphoresis.  As stated above she states she has continuing chest pain lasting 5 to 10 minutes several times a day.  She states she recently started back on her insulin pump which is brought her blood sugars down.  States she has left leg pain and has had multiple work-ups showing no significant issues.  She states she believes this is from neuropathy from her diabetes.  She is on Lyrica for neuropathic pain/neuropathy.  She states she is having significant issues with IBS, specifically constipation.  She is on Linzess for constipation which she states initially worked but seems to be working less and less.  States on quite a few occasions she does not have a bowel movement for several days.  States she has tried multiple laxatives, stool  softeners, etc. which never seem to work that well.   Past Medical History:  Diagnosis Date  . Benign paroxysmal vertigo   . Cervicalgia   . Depression   . Diabetes ([place])    Type1  . Diabetes mellitus without complication ([place])   . GERD (gastroesophageal reflux disease)   . Hiatal hernia   . History of cardiac catheterization    Normal coronary arteries 2016  . History of TIA (transient ischemic attack)   . Hyperlipidemia   . Hypertension   . IBS (irritable  bowel syndrome)   . Migraine headache   . Panic disorder with agoraphobia   . Peripheral neuropathy   . Subungual hematoma of digit of hand   . Tendonitis   . Vertigo     Past Surgical History:  Procedure Laterality Date  . ABDOMINAL HYSTERECTOMY    . APPENDECTOMY    . CARDIAC CATHETERIZATION   last in 2009   x 4, normal coronary arteries  . CARDIAC CATHETERIZATION N/A 03/26/2015   Procedure: Left Heart Cath and Coronary Angiography;  Surgeon: Wellington Hampshire, MD;  Location: [facility];  Service: Cardiovascular;  Laterality: N/A;  . CARPAL TUNNEL RELEASE Right 08/24/2018   Procedure: CARPAL TUNNEL RELEASE;  Surgeon: Carole Civil, MD;  Location: [facility];  Service: Orthopedics;  Laterality: Right;  . CESAREAN SECTION    . CHOLECYSTECTOMY    . COLONOSCOPY WITH PROPOFOL N/A 01/09/2018   Procedure: COLONOSCOPY WITH PROPOFOL;  Surgeon: Rogene Houston, MD;  Location: [facility];  Service: Endoscopy;  Laterality: N/A;  . COLONOSCOPY WITH PROPOFOL N/A 09/21/2019   Procedure: COLONOSCOPY WITH PROPOFOL;  Surgeon: Rogene Houston, MD;  Location: [facility];  Service: Endoscopy;  Laterality: N/A;  1055  . CYST EXCISION     right breast  . ESOPHAGOGASTRODUODENOSCOPY (EGD) WITH PROPOFOL N/A 01/09/2018   Procedure: ESOPHAGOGASTRODUODENOSCOPY (EGD) WITH PROPOFOL;  Surgeon: Rogene Houston, MD;  Location: [facility];  Service: Endoscopy;  Laterality: N/A;  . POLYPECTOMY  09/21/2019   Procedure: POLYPECTOMY;  Surgeon: Rogene Houston, MD;  Location: [facility];  Service: Endoscopy;;  ascending colon;  . 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 TUBAL LIGATION      Current Outpatient Medications  Medication Sig Dispense Refill  . diazepam (VALIUM) 5 MG tablet Take 5 mg by mouth every  8 (eight) hours as needed for anxiety.    . docusate sodium (COLACE) 100 MG capsule Take 100-200 mg by mouth See admin instructions. Take 100 mg by mouth in the morning and 200 mg in the evening    . DULoxetine (CYMBALTA) 30 MG capsule Take 30 mg by mouth 2 (two) times daily.    Marland Kitchen EPIPEN 2-PAK 0.3 MG/0.3ML SOAJ injection Inject 0.3 mg into the muscle as needed for anaphylaxis.     Marland Kitchen estradiol (ESTRACE) 1 MG tablet Take 1 mg by mouth daily.    . ferrous sulfate 325 (65 FE) MG tablet Take 325 mg by mouth daily with breakfast.    . furosemide (LASIX) 40 MG tablet Take 40 mg by mouth daily.    Marland Kitchen GLUCAGEN HYPOKIT 1 MG SOLR injection Inject 1 mg into the skin once as needed for low blood sugar.     . Glucagon 3 MG/DOSE POWD Place  3 mg into the nose once as needed for up to 1 dose. 1 each 11  . Insulin Human (INSULIN PUMP) SOLN Inject 1 each into the skin as directed.    . insulin lispro (HUMALOG) 100 UNIT/ML injection Inject into the skin as directed.    . linaclotide (LINZESS) 145 MCG CAPS capsule Take 1 capsule (145 mcg total) by mouth daily. 30 capsule 5  . lisinopril (PRINIVIL,ZESTRIL) 5 MG tablet Take 5 mg by mouth daily.  2  . Melatonin 5 MG TABS Take 10 mg by mouth at bedtime as needed (sleep).     . nitrofurantoin, macrocrystal-monohydrate, (MACROBID) 100 MG capsule Take 100 mg by mouth 2 (two) times daily.    . nitroGLYCERIN (NITROSTAT) 0.4 MG SL tablet Place 1 tablet (0.4 mg total) under the tongue every 5 (five) minutes x 3 doses as needed for chest pain (if no relief after 3rd dose, proceed to the ED for an evaluation or call 911). 25 tablet 3  . Omega-3 Fatty Acids (FISH OIL) 1000 MG CAPS Take 1,000 mg by mouth daily.    . polycarbophil (FIBERCON) 625 MG tablet Take 625 mg by mouth daily.    . pravastatin (PRAVACHOL) 10 MG tablet Take 10 mg by mouth at bedtime.  2  . pregabalin (LYRICA) 300 MG capsule Take 300 mg by mouth 2 (two) times daily.    . promethazine (PHENERGAN) 25 MG tablet Take  25 mg by mouth every 6 (six) hours as needed for nausea/vomiting.    Marland Kitchen tiZANidine (ZANAFLEX) 4 MG tablet Take 4 mg by mouth every 8 (eight) hours as needed for muscle spasms.      No current facility-administered medications for this visit.   Allergies:  Bee venom, Zocor [simvastatin], Clindamycin/lincomycin, Codeine, Penicillins, and Sulfa antibiotics   Social History: The patient  reports that she has been smoking cigarettes. She has a 5.50 pack-year smoking history. She has never used smokeless tobacco. She reports current alcohol use. She reports that she does not use drugs.   Family History: The patient's family history includes Cancer in her maternal grandfather and sister; Heart attack (age of onset: 35) in her mother; Heart failure (age of onset: 58) in her paternal grandfather; Heart failure (age of onset: 38) in her maternal grandmother; Heart failure (age of onset: 54) in her paternal grandmother; Stroke (age of onset: 106) in her mother.   ROS:  Please see the history of present illness. Otherwise, complete review of systems is positive for none.  All other systems are reviewed and negative.   Physical Exam: VS:  BP 100/60   Pulse 80   Ht 5\' 7"  (1.702 m)   Wt 166 lb 6.4 oz (75.5 kg)   SpO2 97%   BMI 26.06 kg/m , BMI Body mass index is 26.06 kg/m.  Wt Readings from Last 3 Encounters:  02/14/20 166 lb 6.4 oz (75.5 kg)  12/20/19 162 lb (73.5 kg)  12/04/19 157 lb (71.2 kg)    General: Patient appears comfortable at rest. Neck: Supple, no elevated JVP or carotid bruits, no thyromegaly. Lungs: Clear to auscultation, nonlabored breathing at rest. Cardiac: Regular rate and rhythm, no S3 or significant systolic murmur, no pericardial rub. Extremities: No pitting edema, distal pulses 2+. Skin: Warm and dry. Musculoskeletal: No kyphosis. Neuropsychiatric: Alert and oriented x3, affect grossly appropriate.  ECG:   Recent EKG on 11/16/2019 at the emergency department showed normal  sinus rhythm rate of 80, right atrial enlargement, rightward access.  Recent  Labwork: 11/16/2019: ALT 20; AST 21; B Natriuretic Peptide 28.0; BUN 13; Creatinine, Ser 0.62; Hemoglobin 14.3; Platelets 186; Potassium 4.0; Sodium 139     Component Value Date/Time   CHOL 156 12/26/2018 1350   TRIG 86.0 12/26/2018 1350   HDL 44.80 12/26/2018 1350   CHOLHDL 3 12/26/2018 1350   VLDL 17.2 12/26/2018 1350   LDLCALC 94 12/26/2018 1350    Other Studies Reviewed Today:   Nuclear stress test 11/29/2019  There was no ST segment deviation noted during stress.  The study is normal. There are no perfusion defects  This is a low risk study.  The left ventricular ejection fraction is normal (55-65%).   Echocardiogram 11/29/2019 1. Left ventricular ejection fraction, by estimation, is 60 to 65%. The left ventricle has normal function. The left ventricle has no regional wall motion abnormalities. Left ventricular diastolic parameters were normal. 2. Right ventricular systolic function is normal. The right ventricular size is normal. 3. The mitral valve is normal in structure. No evidence of mitral valve regurgitation. No evidence of mitral stenosis. 4. The aortic valve is tricuspid. Aortic valve regurgitation is not visualized. No aortic stenosis is present. 5. The inferior vena cava is normal in size with greater than 50% respiratory variability, suggesting right atrial pressure of 3 mmHg.    Cardiac catheterization 03/26/2015: 1. Normal coronary arteries in a left dominant system.  2. Normal LV systolic function and left ventricular end-diastolic pressure.  Recommendations: The chest pain does not seem to be cardiac. Continue aggressive treatment of risk factors.  Echocardiogram 08/18/2014: Study Conclusions  - Left ventricle: The cavity size was normal. Systolic function was normal. The estimated ejection fraction was in the range of 55% to 60%. Wall motion was normal; there were  no regional wall motion abnormalities. - Aortic valve: Valve area (Vmax): 1.98 cm^2.  Impressions:  - No cardiac source of emboli was indentified.  Lower Extremity Venous Duplex for LLE pain IMPRESSION: No femoropopliteal DVT nor evidence of DVT within the visualized calf veins.  If clinical symptoms are inconsistent or if there are persistent or worsening symptoms, further imaging (possibly involving the iliac veins) may be warranted.   Assessment and Plan:   1. Atypical chest pain Continues with chest pain.  I talked to her about her request regarding wanting to have a repeat cardiac catheterization.  I mentioned we will have to discuss the case with Dr. Domenic Polite to see what his decision is going forward regarding a repeat catheterization.  At last visit she was having palpitations. She had an episode where she felt like her heart was beating out of her chest and she had associated chest pain and some dizziness.  Her stress test was normal and had no significant perfusion defects.  No ST segment deviation on EKG.  Her EF was 55 to 65% on stress.  She was ordered a ZIO patch monitor.  Stated she was unable to wear it due to other medical issues.  She has not voiced any further issues with palpitations today during visit.  Start Imdur 30 mg daily.  We will discuss case with Dr. Domenic Polite to determine need for cardiac catheterization.  2. Mixed hyperlipidemia Recent lipid panel Dec 26, 2018 showed TC 146, TG 86, HDL 44, LDL 94.  Continue Pravachol 10 mg daily.  3. Type 2 diabetes mellitus without complication, with long-term current use of insulin (HCC) Patient's had a long history of diabetes since age 74.  She has previously been on insulin pumps  in the past.  Managed by PCP.  Patient states she recently restarted her insulin pump and blood sugars have been better.  4.  Dyspnea Patient  had been having increased dyspnea on exertion along with associated chest pain when performing  exertional activity.  Also complained of recent lower extremity edema.  Her recent echocardiogram was normal.  5.  Hypertension. Blood pressures well controlled on current medication.  Blood pressure today 104/64.  Continue lisinopril 5 mg p.o. daily  6.  Smoking Patient has 17 years history of smoking.  Highly advised patient to stop smoking given her other risk factors for heart disease.  Patient states she knows she  needs to quit.  7.  Palpitations She was ordered a Zio patch monitor at last visit.  States she was unable to wear due to other multiple medical issues.  She denies any palpitations on visit today.   8.  Complaints of lower extremity edema and weight gain. No noticeable lower extremity edema noted on exam today.  States she has gained 15 pounds over the last month or so.  In looking at her previous weights she weighed 157 pounds on Dec 04, 2019.  Today's weight is 166.  An approximately 9 pound weight gain since May  Medication Adjustments/Labs and Tests Ordered: Current medicines are reviewed at length with the patient today.  Concerns regarding medicines are outlined above.   Disposition: Follow-up with Dr. Domenic Polite or APP 2 months Signed, Levell July, NP 02/14/2020 2:18 PM    [facility] [place], [place], [place] [address] Phone: [phone]; Fax: [phone]

## 2020-02-14 NOTE — Patient Instructions (Addendum)
Medication Instructions:   Your physician has recommended you make the following change in your medication:   Start isosorbide mononitrate 30 mg by mouth daily  Continue other medications the same  Labwork:  NONE  Testing/Procedures:  NONE  Follow-Up:  Your physician recommends that you schedule a follow-up appointment in: 3 months (office).  Any Other Special Instructions Will Be Listed Below (If Applicable).  If you need a refill on your cardiac medications before your next appointment, please call your pharmacy.

## 2020-02-14 NOTE — Progress Notes (Signed)
Cardiology Office Note  Date: 02/14/2020   ID: [name], DOB Nov 14, 1970, MRN [id number]  PCP:  Denny Levy, PA  Cardiologist:  Rozann Lesches, MD Electrophysiologist:  None   Chief Complaint: F/U HTN, HLD, DM II,   History of Present Illness: [name] is a 49 y.o. female with a history of  HTN, HLD, DM II.  Recent ER visit  11/16/2019 for c/o of intermittent chest pain, SOB, bilateral leg edema, and fatigue. EKG was negative for acute abnormalities, CXR no acute changes. BNP was negative, troponins were negative.  Last saw Dr. Domenic Polite on 05/03/2018 for complaints of chest pain during an ER visit.  At that time she complained of left-sided chest discomfort with some radiation into the left shoulder and upper arm not precipitated by activity.  Also had some relative fluctuations in blood pressure and heart rate at home.  She had a previous consultation with Dr. Harl Bowie back in 2016 related to chest pain.  She had a subsequent cardiac catheterization at that time which revealed normal coronary arteries.  Last visit with me on 12/20/2019 with complaints of palpitations.  Continued to complain of some swelling in her lower extremities.  Feeling dizzy at times tired with some chest discomfort.  Complaining of some left leg pain.  She stated  she had some palpitations.  She had an incident where she states she felt like her heart was going to beat out of her chest and she had chest pain and associated dizziness.  She denied any CVA or TIA-like symptoms, PND/orthopnea.  Claudication-like symptoms, DVT or PE-like symptoms.  She  does have significant issues with diabetes and a long history since age 70.  Recently saw a dietitian to help with better control of diabetes  Last visit she was ordered a Zio patch monitor.  She called on 02/11/2020 and spoke to Mrs. Ashworth CMA stating she had been unable to wear the heart monitor since she had other health problems wanted to know if she  could go ahead and schedule cardiac catheterization.  She stated she was having chest pain almost daily lasting 5 to 10 minutes several times daily.  Complaining of swelling in feet and ankles and gained approximately 15 pounds in last month.  Patient was made aware of chest pain/symptoms escalate she would need to go to the emergency room for evaluation.  Continues to complain of chest pain that is intermittent.  Associated with and without exertion.  States it sometimes radiates to her left arm.  Describes the pain as a squeezing-like pain.  Continues to complain of significant fatigue.  She continues to smoke in spite of multiple attempts to encourage cessation.  She denies any associated nausea, vomiting, diaphoresis.  As stated above she states she has continuing chest pain lasting 5 to 10 minutes several times a day.  She states she recently started back on her insulin pump which is brought her blood sugars down.  States she has left leg pain and has had multiple work-ups showing no significant issues.  She states she believes this is from neuropathy from her diabetes.  She is on Lyrica for neuropathic pain/neuropathy.  She states she is having significant issues with IBS, specifically constipation.  She is on Linzess for constipation which she states initially worked but seems to be working less and less.  States on quite a few occasions she does not have a bowel movement for several days.  States she has tried multiple laxatives, stool  softeners, etc. which never seem to work that well.   Past Medical History:  Diagnosis Date  . Benign paroxysmal vertigo   . Cervicalgia   . Depression   . Diabetes ([place])    Type1  . Diabetes mellitus without complication ([place])   . GERD (gastroesophageal reflux disease)   . Hiatal hernia   . History of cardiac catheterization    Normal coronary arteries 2016  . History of TIA (transient ischemic attack)   . Hyperlipidemia   . Hypertension   . IBS (irritable  bowel syndrome)   . Migraine headache   . Panic disorder with agoraphobia   . Peripheral neuropathy   . Subungual hematoma of digit of hand   . Tendonitis   . Vertigo     Past Surgical History:  Procedure Laterality Date  . ABDOMINAL HYSTERECTOMY    . APPENDECTOMY    . CARDIAC CATHETERIZATION   last in 2009   x 4, normal coronary arteries  . CARDIAC CATHETERIZATION N/A 03/26/2015   Procedure: Left Heart Cath and Coronary Angiography;  Surgeon: Wellington Hampshire, MD;  Location: [facility];  Service: Cardiovascular;  Laterality: N/A;  . CARPAL TUNNEL RELEASE Right 08/24/2018   Procedure: CARPAL TUNNEL RELEASE;  Surgeon: Carole Civil, MD;  Location: [facility];  Service: Orthopedics;  Laterality: Right;  . CESAREAN SECTION    . CHOLECYSTECTOMY    . COLONOSCOPY WITH PROPOFOL N/A 01/09/2018   Procedure: COLONOSCOPY WITH PROPOFOL;  Surgeon: Rogene Houston, MD;  Location: [facility];  Service: Endoscopy;  Laterality: N/A;  . COLONOSCOPY WITH PROPOFOL N/A 09/21/2019   Procedure: COLONOSCOPY WITH PROPOFOL;  Surgeon: Rogene Houston, MD;  [facility]: [facility];  Service: Endoscopy;  Laterality: N/A;  1055  . CYST EXCISION     right breast  . ESOPHAGOGASTRODUODENOSCOPY (EGD) WITH PROPOFOL N/A 01/09/2018   Procedure: ESOPHAGOGASTRODUODENOSCOPY (EGD) WITH PROPOFOL;  Surgeon: Rogene Houston, MD;  [facility];  Service: Endoscopy;  Laterality: N/A;  . POLYPECTOMY  09/21/2019   Procedure: POLYPECTOMY;  Surgeon: Rogene Houston, MD;  [facility]: [facility];  Service: Endoscopy;;  ascending colon;  . 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 TUBAL LIGATION      Current Outpatient Medications  Medication Sig Dispense Refill  . diazepam (VALIUM) 5 MG tablet Take 5 mg by mouth every  8 (eight) hours as needed for anxiety.    . docusate sodium (COLACE) 100 MG capsule Take 100-200 mg by mouth See admin instructions. Take 100 mg by mouth in the morning and 200 mg in the evening    . DULoxetine (CYMBALTA) 30 MG capsule Take 30 mg by mouth 2 (two) times daily.    Marland Kitchen EPIPEN 2-PAK 0.3 MG/0.3ML SOAJ injection Inject 0.3 mg into the muscle as needed for anaphylaxis.     Marland Kitchen estradiol (ESTRACE) 1 MG tablet Take 1 mg by mouth daily.    . ferrous sulfate 325 (65 FE) MG tablet Take 325 mg by mouth daily with breakfast.    . furosemide (LASIX) 40 MG tablet Take 40 mg by mouth daily.    Marland Kitchen GLUCAGEN HYPOKIT 1 MG SOLR injection Inject 1 mg into the skin once as needed for low blood sugar.     . Glucagon 3 MG/DOSE POWD Place  3 mg into the nose once as needed for up to 1 dose. 1 each 11  . Insulin Human (INSULIN PUMP) SOLN Inject 1 each into the skin as directed.    . insulin lispro (HUMALOG) 100 UNIT/ML injection Inject into the skin as directed.    . linaclotide (LINZESS) 145 MCG CAPS capsule Take 1 capsule (145 mcg total) by mouth daily. 30 capsule 5  . lisinopril (PRINIVIL,ZESTRIL) 5 MG tablet Take 5 mg by mouth daily.  2  . Melatonin 5 MG TABS Take 10 mg by mouth at bedtime as needed (sleep).     . nitrofurantoin, macrocrystal-monohydrate, (MACROBID) 100 MG capsule Take 100 mg by mouth 2 (two) times daily.    . nitroGLYCERIN (NITROSTAT) 0.4 MG SL tablet Place 1 tablet (0.4 mg total) under the tongue every 5 (five) minutes x 3 doses as needed for chest pain (if no relief after 3rd dose, proceed to the ED for an evaluation or call 911). 25 tablet 3  . Omega-3 Fatty Acids (FISH OIL) 1000 MG CAPS Take 1,000 mg by mouth daily.    . polycarbophil (FIBERCON) 625 MG tablet Take 625 mg by mouth daily.    . pravastatin (PRAVACHOL) 10 MG tablet Take 10 mg by mouth at bedtime.  2  . pregabalin (LYRICA) 300 MG capsule Take 300 mg by mouth 2 (two) times daily.    . promethazine (PHENERGAN) 25 MG tablet Take  25 mg by mouth every 6 (six) hours as needed for nausea/vomiting.    Marland Kitchen tiZANidine (ZANAFLEX) 4 MG tablet Take 4 mg by mouth every 8 (eight) hours as needed for muscle spasms.      No current facility-administered medications for this visit.   Allergies:  Bee venom, Zocor [simvastatin], Clindamycin/lincomycin, Codeine, Penicillins, and Sulfa antibiotics   Social History: The patient  reports that she has been smoking cigarettes. She has a 5.50 pack-year smoking history. She has never used smokeless tobacco. She reports current alcohol use. She reports that she does not use drugs.   Family History: The patient's family history includes Cancer in her maternal grandfather and sister; Heart attack (age of onset: 52) in her mother; Heart failure (age of onset: 71) in her paternal grandfather; Heart failure (age of onset: 4) in her maternal grandmother; Heart failure (age of onset: 52) in her paternal grandmother; Stroke (age of onset: 39) in her mother.   ROS:  Please see the history of present illness. Otherwise, complete review of systems is positive for none.  All other systems are reviewed and negative.   Physical Exam: VS:  BP 100/60   Pulse 80   Ht 5\' 7"  (1.702 m)   Wt 166 lb 6.4 oz (75.5 kg)   SpO2 97%   BMI 26.06 kg/m , BMI Body mass index is 26.06 kg/m.  Wt Readings from Last 3 Encounters:  02/14/20 166 lb 6.4 oz (75.5 kg)  12/20/19 162 lb (73.5 kg)  12/04/19 157 lb (71.2 kg)    General: Patient appears comfortable at rest. Neck: Supple, no elevated JVP or carotid bruits, no thyromegaly. Lungs: Clear to auscultation, nonlabored breathing at rest. Cardiac: Regular rate and rhythm, no S3 or significant systolic murmur, no pericardial rub. Extremities: No pitting edema, distal pulses 2+. Skin: Warm and dry. Musculoskeletal: No kyphosis. Neuropsychiatric: Alert and oriented x3, affect grossly appropriate.  ECG:   Recent EKG on 11/16/2019 at the emergency department showed normal  sinus rhythm rate of 80, right atrial enlargement, rightward access.  Recent  Labwork: 11/16/2019: ALT 20; AST 21; B Natriuretic Peptide 28.0; BUN 13; Creatinine, Ser 0.62; Hemoglobin 14.3; Platelets 186; Potassium 4.0; Sodium 139     Component Value Date/Time   CHOL 156 12/26/2018 1350   TRIG 86.0 12/26/2018 1350   HDL 44.80 12/26/2018 1350   CHOLHDL 3 12/26/2018 1350   VLDL 17.2 12/26/2018 1350   LDLCALC 94 12/26/2018 1350    Other Studies Reviewed Today:   Nuclear stress test 11/29/2019  There was no ST segment deviation noted during stress.  The study is normal. There are no perfusion defects  This is a low risk study.  The left ventricular ejection fraction is normal (55-65%).   Echocardiogram 11/29/2019 1. Left ventricular ejection fraction, by estimation, is 60 to 65%. The left ventricle has normal function. The left ventricle has no regional wall motion abnormalities. Left ventricular diastolic parameters were normal. 2. Right ventricular systolic function is normal. The right ventricular size is normal. 3. The mitral valve is normal in structure. No evidence of mitral valve regurgitation. No evidence of mitral stenosis. 4. The aortic valve is tricuspid. Aortic valve regurgitation is not visualized. No aortic stenosis is present. 5. The inferior vena cava is normal in size with greater than 50% respiratory variability, suggesting right atrial pressure of 3 mmHg.    Cardiac catheterization 03/26/2015: 1. Normal coronary arteries in a left dominant system.  2. Normal LV systolic function and left ventricular end-diastolic pressure.  Recommendations: The chest pain does not seem to be cardiac. Continue aggressive treatment of risk factors.  Echocardiogram 08/18/2014: Study Conclusions  - Left ventricle: The cavity size was normal. Systolic function was normal. The estimated ejection fraction was in the range of 55% to 60%. Wall motion was normal; there were  no regional wall motion abnormalities. - Aortic valve: Valve area (Vmax): 1.98 cm^2.  Impressions:  - No cardiac source of emboli was indentified.  Lower Extremity Venous Duplex for LLE pain IMPRESSION: No femoropopliteal DVT nor evidence of DVT within the visualized calf veins.  If clinical symptoms are inconsistent or if there are persistent or worsening symptoms, further imaging (possibly involving the iliac veins) may be warranted.   Assessment and Plan:   1. Atypical chest pain Continues with chest pain.  I talked to her about her request regarding wanting to have a repeat cardiac catheterization.  I mentioned we will have to discuss the case with Dr. Domenic Polite to see what his decision is going forward regarding a repeat catheterization.  At last visit she was having palpitations. She had an episode where she felt like her heart was beating out of her chest and she had associated chest pain and some dizziness.  Her stress test was normal and had no significant perfusion defects.  No ST segment deviation on EKG.  Her EF was 55 to 65% on stress.  She was ordered a ZIO patch monitor.  Stated she was unable to wear it due to other medical issues.  She has not voiced any further issues with palpitations today during visit.  Start Imdur 30 mg daily.  We will discuss case with Dr. Domenic Polite to determine need for cardiac catheterization.  2. Mixed hyperlipidemia Recent lipid panel Dec 26, 2018 showed TC 146, TG 86, HDL 44, LDL 94.  Continue Pravachol 10 mg daily.  3. Type 2 diabetes mellitus without complication, with long-term current use of insulin (HCC) Patient's had a long history of diabetes since age 71.  She has previously been on insulin pumps  in the past.  Managed by PCP.  Patient states she recently restarted her insulin pump and blood sugars have been better.  4.  Dyspnea Patient  had been having increased dyspnea on exertion along with associated chest pain when performing  exertional activity.  Also complained of recent lower extremity edema.  Her recent echocardiogram was normal.  5.  Hypertension. Blood pressures well controlled on current medication.  Blood pressure today 104/64.  Continue lisinopril 5 mg p.o. daily  6.  Smoking Patient has 17 years history of smoking.  Highly advised patient to stop smoking given her other risk factors for heart disease.  Patient states she knows she  needs to quit.  7.  Palpitations She was ordered a Zio patch monitor at last visit.  States she was unable to wear due to other multiple medical issues.  She denies any palpitations on visit today.   8.  Complaints of lower extremity edema and weight gain. No noticeable lower extremity edema noted on exam today.  States she has gained 15 pounds over the last month or so.  In looking at her previous weights she weighed 157 pounds on Dec 04, 2019.  Today's weight is 166.  An approximately 9 pound weight gain since May  Medication Adjustments/Labs and Tests Ordered: Current medicines are reviewed at length with the patient today.  Concerns regarding medicines are outlined above.   Disposition: Follow-up with Dr. Domenic Polite or APP 2 months Signed, Levell July, NP 02/14/2020 2:18 PM    [facility] [place], [place], [place] [address] Phone: [phone]; Fax: [phone]

## 2020-02-18 ENCOUNTER — Telehealth: Payer: Self-pay | Admitting: *Deleted

## 2020-02-18 NOTE — Telephone Encounter (Signed)
At the last visit we started her on Imdur 30 mg daily.  If the medications have failed to manage her chest pain she needs to come back soon within the next week or so and we will need to schedule her for cardiac catheterization if she is continuing with chest pain

## 2020-02-18 NOTE — Telephone Encounter (Signed)
Pt wanted to know if providers recommended for her to go ahead and schedule cath - per recent office notes Katina Dung, NP was going to discuss with Dr Domenic Polite

## 2020-02-19 ENCOUNTER — Ambulatory Visit: Payer: Medicare Other | Admitting: Family Medicine

## 2020-02-19 ENCOUNTER — Telehealth: Payer: Self-pay | Admitting: Nutrition

## 2020-02-19 NOTE — Telephone Encounter (Signed)
She was told to start Lantus 20u in AM and 10u PM, and Novolog (she is not counting carbs), but taking units of Insulin for meal time.  She says she is doing 8-10acB, 2-3 acL and 13-16 acS.  She says she has 1 full Lantus pen (300u) left and can use that.

## 2020-02-19 NOTE — Telephone Encounter (Signed)
Patient reports that she is going to have a heart catheterization  on Monday.  They told her that she is to go off her pump the day before and to be off of it for 2 days following the procedure.  Please advise

## 2020-02-19 NOTE — Telephone Encounter (Signed)
[name], I am not sure exactly what pump settings he is on right now, but this was her previous regimen:  - Lantus  20 >> 25 units in am and 15 >> 10 at night - Novolog: -6-8 >> 8-10 units before a breakfast -10-14 >> 2-3 units before lunch, if you have a small meal -14-16 units before dinner - Sliding scale Novolog:             150-200: + 1 unit 201-250: + 2 units >250: + 3 units  We may do something similar: - Lantus 20 units in a.m. and 10 units at night, for example - Novolog based on her pump settings (insulin to carb ratio and sliding scale)  Would you be able to advise the patient and also see if she has Lantus at home?

## 2020-02-19 NOTE — Telephone Encounter (Signed)
Pt still having chest pain and is taking Imdur 30 daily - scheduled pt for Friday 7/23 with Katina Dung, NP

## 2020-02-20 NOTE — Telephone Encounter (Signed)
Thank you so much, Linda!

## 2020-02-21 ENCOUNTER — Other Ambulatory Visit (HOSPITAL_COMMUNITY)
Admission: RE | Admit: 2020-02-21 | Discharge: 2020-02-21 | Disposition: A | Discharge status: Home / Self Care | Payer: Medicare Other | Source: Ambulatory Visit | Attending: Family Medicine | Admitting: Family Medicine

## 2020-02-21 ENCOUNTER — Telehealth: Payer: Self-pay | Admitting: Internal Medicine

## 2020-02-21 ENCOUNTER — Telehealth: Payer: Self-pay | Admitting: *Deleted

## 2020-02-21 ENCOUNTER — Telehealth: Payer: Self-pay | Admitting: Family Medicine

## 2020-02-21 ENCOUNTER — Other Ambulatory Visit: Payer: Self-pay

## 2020-02-21 DIAGNOSIS — I10 Essential (primary) hypertension: Secondary | ICD-10-CM | POA: Insufficient documentation

## 2020-02-21 LAB — BASIC METABOLIC PANEL
Anion gap: 8 (ref 5–15)
BUN: 13 mg/dL (ref 6–20)
CO2: 25 mmol/L (ref 22–32)
Calcium: 9 mg/dL (ref 8.9–10.3)
Chloride: 105 mmol/L (ref 98–111)
Creatinine, Ser: 0.68 mg/dL (ref 0.44–1.00)
GFR calc Af Amer: >60 mL/min (ref >60)
GFR calc non Af Amer: >60 mL/min (ref >60)
Glucose, Bld: 193 mg/dL — ABNORMAL HIGH (ref 70–99)
Potassium: 4.2 mmol/L (ref 3.5–5.1)
Sodium: 138 mmol/L (ref 135–145)

## 2020-02-21 LAB — CBC
HCT: 43.6 % (ref 36.0–46.0)
Hemoglobin: 14.5 g/dL (ref 12.0–15.0)
MCH: 30.1 pg (ref 26.0–34.0)
MCHC: 33.3 g/dL (ref 30.0–36.0)
MCV: 90.6 fL (ref 80.0–100.0)
Platelets: 173 10*3/uL (ref 150–400)
RBC: 4.81 MIL/uL (ref 3.87–5.11)
RDW: 12.7 % (ref 11.5–15.5)
WBC: 8.8 10*3/uL (ref 4.0–10.5)
nRBC: 0 % (ref 0.0–0.2)

## 2020-02-21 NOTE — Telephone Encounter (Signed)
Pt scheduled for [facility] 02/25/2020 with Dr Claiborne Billings 7:30am - pt voiced understanding of instructions and arrival time is at 5:30am - pt will have labs done at [facility] today or tomorrow

## 2020-02-21 NOTE — Telephone Encounter (Signed)
M, since she has type 1 diabetes, she cannot stop insulin!!!   Please advise her to continue with the long-acting insulin and only take boluses for meals if she is eating.  Otherwise, only take correction insulin. DO NOT STOP INSULIN!!!

## 2020-02-21 NOTE — Telephone Encounter (Signed)
Patient called stating she has a procedure on Monday 7/26 at 7:30am and the performing Dr (Dr Claiborne Billings) has told patient not to take any insulin for 24 hours prior to procedure and 48 hours after procedure and patient is pretty concerned with this, not sure if this is good for her body and how it will affect it. Patient requests call back at ph# [phone]

## 2020-02-21 NOTE — Telephone Encounter (Signed)
Pre-cert Verification for the following procedure      left heart cath 7/26 with Dr Claiborne Billings at 7:30am

## 2020-02-21 NOTE — Telephone Encounter (Signed)
-----   Message from Verta Ellen., NP sent at 02/21/2020  8:53 AM EDT ----- Regarding: Cardiac catheterization informed consent I discussed the risk and benefits of cardiac catheterizations with patient at length today in a phone conversation.  Patient verbalizes understanding.  Please schedule her for an outpatient elective left heart cardiac catheterization.  Thank you

## 2020-02-22 ENCOUNTER — Ambulatory Visit: Payer: Medicare Other | Admitting: Family Medicine

## 2020-02-25 ENCOUNTER — Ambulatory Visit (HOSPITAL_COMMUNITY)
Admission: RE | Admit: 2020-02-25 | Discharge: 2020-02-25 | Disposition: A | Discharge status: Home / Self Care | Payer: Medicare Other | Attending: Cardiovascular Disease | Admitting: Cardiovascular Disease

## 2020-02-25 ENCOUNTER — Ambulatory Visit (HOSPITAL_COMMUNITY)
Admission: RE | Disposition: A | Discharge status: Home / Self Care | Payer: Self-pay | Source: Home / Self Care | Attending: Cardiovascular Disease

## 2020-02-25 ENCOUNTER — Other Ambulatory Visit: Payer: Self-pay

## 2020-02-25 ENCOUNTER — Encounter (HOSPITAL_COMMUNITY): Payer: Self-pay | Admitting: Cardiovascular Disease

## 2020-02-25 DIAGNOSIS — R0789 Other chest pain: Secondary | ICD-10-CM | POA: Diagnosis not present

## 2020-02-25 DIAGNOSIS — F17210 Nicotine dependence, cigarettes, uncomplicated: Secondary | ICD-10-CM | POA: Insufficient documentation

## 2020-02-25 DIAGNOSIS — R0609 Other forms of dyspnea: Secondary | ICD-10-CM | POA: Diagnosis not present

## 2020-02-25 DIAGNOSIS — E1040 Type 1 diabetes mellitus with diabetic neuropathy, unspecified: Secondary | ICD-10-CM | POA: Diagnosis not present

## 2020-02-25 DIAGNOSIS — Z8673 Personal history of transient ischemic attack (TIA), and cerebral infarction without residual deficits: Secondary | ICD-10-CM | POA: Insufficient documentation

## 2020-02-25 DIAGNOSIS — Z880 Allergy status to penicillin: Secondary | ICD-10-CM | POA: Diagnosis not present

## 2020-02-25 DIAGNOSIS — E782 Mixed hyperlipidemia: Secondary | ICD-10-CM | POA: Diagnosis not present

## 2020-02-25 DIAGNOSIS — Z885 Allergy status to narcotic agent status: Secondary | ICD-10-CM | POA: Diagnosis not present

## 2020-02-25 DIAGNOSIS — Z79899 Other long term (current) drug therapy: Secondary | ICD-10-CM | POA: Diagnosis not present

## 2020-02-25 DIAGNOSIS — K219 Gastro-esophageal reflux disease without esophagitis: Secondary | ICD-10-CM | POA: Diagnosis not present

## 2020-02-25 DIAGNOSIS — I10 Essential (primary) hypertension: Secondary | ICD-10-CM | POA: Diagnosis not present

## 2020-02-25 DIAGNOSIS — Z881 Allergy status to other antibiotic agents status: Secondary | ICD-10-CM | POA: Insufficient documentation

## 2020-02-25 DIAGNOSIS — R002 Palpitations: Secondary | ICD-10-CM | POA: Insufficient documentation

## 2020-02-25 DIAGNOSIS — Z882 Allergy status to sulfonamides status: Secondary | ICD-10-CM | POA: Diagnosis not present

## 2020-02-25 DIAGNOSIS — R079 Chest pain, unspecified: Secondary | ICD-10-CM | POA: Diagnosis not present

## 2020-02-25 DIAGNOSIS — F329 Major depressive disorder, single episode, unspecified: Secondary | ICD-10-CM | POA: Diagnosis not present

## 2020-02-25 DIAGNOSIS — K589 Irritable bowel syndrome without diarrhea: Secondary | ICD-10-CM | POA: Diagnosis not present

## 2020-02-25 DIAGNOSIS — Z794 Long term (current) use of insulin: Secondary | ICD-10-CM | POA: Insufficient documentation

## 2020-02-25 HISTORY — PX: LEFT HEART CATH AND CORONARY ANGIOGRAPHY: CATH118249

## 2020-02-25 LAB — GLUCOSE, CAPILLARY: Glucose-Capillary: 278 mg/dL — ABNORMAL HIGH (ref 70–99)

## 2020-02-25 SURGERY — LEFT HEART CATH AND CORONARY ANGIOGRAPHY
Anesthesia: LOCAL

## 2020-02-25 MED ORDER — LIDOCAINE HCL (PF) 1 % IJ SOLN
INTRAMUSCULAR | Status: AC
  Filled 2020-02-25: qty 30

## 2020-02-25 MED ORDER — MIDAZOLAM HCL 2 MG/2ML IJ SOLN
INTRAMUSCULAR | Status: AC
  Filled 2020-02-25: qty 2

## 2020-02-25 MED ORDER — HEPARIN (PORCINE) IN NACL 1000-0.9 UT/500ML-% IV SOLN
INTRAVENOUS | Status: DC | PRN
  Administered 2020-02-25 (×2): 500 mL

## 2020-02-25 MED ORDER — LABETALOL HCL 5 MG/ML IV SOLN: 10.0000 mg | INTRAVENOUS | Status: DC | PRN

## 2020-02-25 MED ORDER — SODIUM CHLORIDE 0.9% FLUSH: 3.0000 mL | Freq: Two times a day (BID) | INTRAVENOUS | Status: DC

## 2020-02-25 MED ORDER — HYDRALAZINE HCL 20 MG/ML IJ SOLN: 10.0000 mg | INTRAMUSCULAR | Status: DC | PRN

## 2020-02-25 MED ORDER — FENTANYL CITRATE (PF) 100 MCG/2ML IJ SOLN
INTRAMUSCULAR | Status: AC
  Filled 2020-02-25: qty 2

## 2020-02-25 MED ORDER — ACETAMINOPHEN 325 MG PO TABS: 650.0000 mg | ORAL_TABLET | ORAL | Status: DC | PRN

## 2020-02-25 MED ORDER — SODIUM CHLORIDE 0.9 % IV SOLN: 250.0000 mL | INTRAVENOUS | Status: DC | PRN

## 2020-02-25 MED ORDER — FENTANYL CITRATE (PF) 100 MCG/2ML IJ SOLN
INTRAMUSCULAR | Status: DC | PRN
  Administered 2020-02-25 (×2): 25 ug via INTRAVENOUS

## 2020-02-25 MED ORDER — VERAPAMIL HCL 2.5 MG/ML IV SOLN
INTRAVENOUS | Status: AC
  Filled 2020-02-25: qty 2

## 2020-02-25 MED ORDER — SODIUM CHLORIDE 0.9 % IV SOLN: INTRAVENOUS | Status: DC

## 2020-02-25 MED ORDER — SODIUM CHLORIDE 0.9 % WEIGHT BASED INFUSION
3.0000 mL/kg/h | INTRAVENOUS | Status: AC
  Administered 2020-02-25: 3 mL/kg/h via INTRAVENOUS

## 2020-02-25 MED ORDER — ASPIRIN 81 MG PO CHEW: 81.0000 mg | CHEWABLE_TABLET | ORAL | Status: AC

## 2020-02-25 MED ORDER — IOHEXOL 350 MG/ML SOLN
INTRAVENOUS | Status: DC | PRN
  Administered 2020-02-25: 35 mL via INTRA_ARTERIAL

## 2020-02-25 MED ORDER — LIDOCAINE HCL (PF) 1 % IJ SOLN
INTRAMUSCULAR | Status: DC | PRN
  Administered 2020-02-25: 15 mL via INTRADERMAL
  Administered 2020-02-25: 2 mL via INTRADERMAL

## 2020-02-25 MED ORDER — SODIUM CHLORIDE 0.9 % WEIGHT BASED INFUSION: 1.0000 mL/kg/h | INTRAVENOUS | Status: DC

## 2020-02-25 MED ORDER — SODIUM CHLORIDE 0.9% FLUSH: 3.0000 mL | INTRAVENOUS | Status: DC | PRN

## 2020-02-25 MED ORDER — MIDAZOLAM HCL 2 MG/2ML IJ SOLN
INTRAMUSCULAR | Status: DC | PRN
  Administered 2020-02-25: 2 mg via INTRAVENOUS
  Administered 2020-02-25: 1 mg via INTRAVENOUS

## 2020-02-25 MED ORDER — VERAPAMIL HCL 2.5 MG/ML IV SOLN: INTRAVENOUS | Status: DC | PRN

## 2020-02-25 MED ORDER — HEPARIN (PORCINE) IN NACL 1000-0.9 UT/500ML-% IV SOLN
INTRAVENOUS | Status: AC
  Filled 2020-02-25: qty 1000

## 2020-02-25 MED ORDER — ONDANSETRON HCL 4 MG/2ML IJ SOLN: 4.0000 mg | Freq: Four times a day (QID) | INTRAMUSCULAR | Status: DC | PRN

## 2020-02-25 MED ORDER — ASPIRIN 81 MG PO CHEW
CHEWABLE_TABLET | ORAL | Status: AC
  Administered 2020-02-25: 81 mg
  Filled 2020-02-25: qty 1

## 2020-02-25 SURGICAL SUPPLY — 10 items
CATH INFINITI 5FR MULTPACK ANG (CATHETERS) ×2 IMPLANT
CLOSURE MYNX CONTROL 5F (Vascular Products) ×2 IMPLANT
GLIDESHEATH SLEND SS 6F .021 (SHEATH) ×2 IMPLANT
KIT HEART LEFT (KITS) ×2 IMPLANT
PACK CARDIAC CATHETERIZATION (CUSTOM PROCEDURE TRAY) ×2 IMPLANT
SHEATH PINNACLE 5F 10CM (SHEATH) ×2 IMPLANT
SHEATH PROBE COVER 6X72 (BAG) ×2 IMPLANT
TRANSDUCER W/STOPCOCK (MISCELLANEOUS) ×2 IMPLANT
TUBING CIL FLEX 10 FLL-RA (TUBING) ×2 IMPLANT
WIRE EMERALD 3MM-J .035X150CM (WIRE) ×2 IMPLANT

## 2020-02-25 NOTE — Progress Notes (Signed)
Pt is Type 1 diabetic. She states she was told to remove her insulin pump, and to do injections as needed. States she took 2 units this am. CBG 278 here today Rennis Harding, Rn called and informed.

## 2020-02-25 NOTE — Discharge Instructions (Signed)
Femoral Site Care This sheet gives you information about how to care for yourself after your procedure. Your health care provider may also give you more specific instructions. If you have problems or questions, contact your health care provider. What can I expect after the procedure? After the procedure, it is common to have:  Bruising that usually fades within 1-2 weeks.  Tenderness at the site. Follow these instructions at home: Wound care  Follow instructions from your health care provider about how to take care of your insertion site. Make sure you: ? Wash your hands with soap and water before you change your bandage (dressing). If soap and water are not available, use hand sanitizer. ? Change your dressing as told by your health care provider. ? Leave stitches (sutures), skin glue, or adhesive strips in place. These skin closures may need to stay in place for 2 weeks or longer. If adhesive strip edges start to loosen and curl up, you may trim the loose edges. Do not remove adhesive strips completely unless your health care provider tells you to do that.  Do not take baths, swim, or use a hot tub until your health care provider approves.  You may shower 24-48 hours after the procedure or as told by your health care provider. ? Gently wash the site with plain soap and water. ? Pat the area dry with a clean towel. ? Do not rub the site. This may cause bleeding.  Do not apply powder or lotion to the site. Keep the site clean and dry.  Check your femoral site every day for signs of infection. Check for: ? Redness, swelling, or pain. ? Fluid or blood. ? Warmth. ? Pus or a bad smell. Activity  For the first 2-3 days after your procedure, or as long as directed: ? Avoid climbing stairs as much as possible. ? Do not squat.  Do not lift anything that is heavier than 10 lb (4.5 kg), or the limit that you are told, until your health care provider says that it is safe.  Rest as  directed. ? Avoid sitting for a long time without moving. Get up to take short walks every 1-2 hours.  Do not drive for 24 hours if you were given a medicine to help you relax (sedative). General instructions  Take over-the-counter and prescription medicines only as told by your health care provider.  Keep all follow-up visits as told by your health care provider. This is important. Contact a health care provider if you have:  A fever or chills.  You have redness, swelling, or pain around your insertion site. Get help right away if:  The catheter insertion area swells very fast.  You pass out.  You suddenly start to sweat or your skin gets clammy.  The catheter insertion area is bleeding, and the bleeding does not stop when you hold steady pressure on the area.  The area near or just beyond the catheter insertion site becomes pale, cool, tingly, or numb. These symptoms may represent a serious problem that is an emergency. Do not wait to see if the symptoms will go away. Get medical help right away. Call your local emergency services (911 in the [place] Do not drive yourself to the hospital. Summary  After the procedure, it is common to have bruising that usually fades within 1-2 weeks.  Check your femoral site every day for signs of infection.  Do not lift anything that is heavier than 10 lb (4.5 kg), or the   limit that you are told, until your health care provider says that it is safe. This information is not intended to replace advice given to you by your health care provider. Make sure you discuss any questions you have with your health care provider. Document Revised: 08/01/2017 Document Reviewed: 08/01/2017 Elsevier Patient Education  2020 [employer]  

## 2020-02-25 NOTE — Interval H&P Note (Signed)
Cath Lab Visit (complete for each Cath Lab visit)  Clinical Evaluation Leading to the Procedure:   ACS: No.  Non-ACS:    Anginal Classification: CCS II  Anti-ischemic medical therapy: Minimal Therapy (1 class of medications)  Non-Invasive Test Results: No non-invasive testing performed  Prior CABG: No previous CABG      History and Physical Interval Note:  02/25/2020 7:44 AM  [name]  has presented today for surgery, with the diagnosis of chest pain.  The various methods of treatment have been discussed with the patient and family. After consideration of risks, benefits and other options for treatment, the patient has consented to  Procedure(s): LEFT HEART CATH AND CORONARY ANGIOGRAPHY (N/A) as a surgical intervention.  The patient's history has been reviewed, patient examined, no change in status, stable for surgery.  I have reviewed the patient's chart and labs.  Questions were answered to the patient's satisfaction.     Shelva Majestic

## 2020-02-25 NOTE — Progress Notes (Signed)
Patient and friend was given discharge instructions. Both verbalized understanding. 

## 2020-02-26 MED FILL — Verapamil HCl IV Soln 2.5 MG/ML: INTRAVENOUS | Qty: 2 | Status: AC

## 2020-03-31 ENCOUNTER — Other Ambulatory Visit (INDEPENDENT_AMBULATORY_CARE_PROVIDER_SITE_OTHER): Payer: Self-pay | Admitting: Internal Medicine

## 2020-04-04 ENCOUNTER — Encounter: Payer: Self-pay | Admitting: Internal Medicine

## 2020-04-04 ENCOUNTER — Ambulatory Visit (INDEPENDENT_AMBULATORY_CARE_PROVIDER_SITE_OTHER): Payer: Medicare Other | Admitting: Internal Medicine

## 2020-04-04 ENCOUNTER — Other Ambulatory Visit: Payer: Self-pay

## 2020-04-04 VITALS — BP 120/70 | HR 79 | Ht 67.0 in | Wt 167.0 lb

## 2020-04-04 DIAGNOSIS — E1065 Type 1 diabetes mellitus with hyperglycemia: Secondary | ICD-10-CM

## 2020-04-04 DIAGNOSIS — E1042 Type 1 diabetes mellitus with diabetic polyneuropathy: Secondary | ICD-10-CM | POA: Diagnosis not present

## 2020-04-04 DIAGNOSIS — R7989 Other specified abnormal findings of blood chemistry: Secondary | ICD-10-CM | POA: Diagnosis not present

## 2020-04-04 LAB — POCT GLYCOSYLATED HEMOGLOBIN (HGB A1C): Hemoglobin A1C: 8.6 % — AB (ref 4.0–5.6)

## 2020-04-04 LAB — VITAMIN B12: Vitamin B-12: >1526 pg/mL — ABNORMAL HIGH (ref 211–911)

## 2020-04-04 LAB — LIPID PANEL
Cholesterol: 144 mg/dL (ref 0–200)
HDL: 44.2 mg/dL (ref >39.00)
LDL Cholesterol: 82 mg/dL (ref 0–99)
NonHDL: 99.35
Total CHOL/HDL Ratio: 3
Triglycerides: 87 mg/dL (ref 0.0–149.0)
VLDL: 17.4 mg/dL (ref 0.0–40.0)

## 2020-04-04 NOTE — Patient Instructions (Addendum)
Please use the following pump settings: - basal rates: 12 am: 0.6 2 pm: 0.7 - ICR:   12 am: 1:14 (if sugars are high after meals after you start bolusing consistently, change the ICR to 1:12) - target: 110-110 - ISF: 45 >> 50 - Active insulin time: 3 hours  Please do the following approximately 15 minutes before every meal: - Enter carbs (C) - Enter sugars (S) - Start insulin bolus (I)  Please stop at the lab.  Please return in 3 months.

## 2020-04-04 NOTE — Progress Notes (Signed)
Patient ID: [name], female   DOB: 1971/07/24, 49 y.o.   MRN: [id number]  This visit occurred during the SARS-CoV-2 public health emergency.  Safety protocols were in place, including screening questions prior to the visit, additional usage of staff PPE, and extensive cleaning of exam room while observing appropriate contact time as indicated for disinfecting solutions.    HPI: [name] is a 49 y.o.-year-old female, returning for f/u for DM1, dx in 65 (49 y/o), uncontrolled, with complications (cerebro-vascular ds - h/o TIA 0/2016, PN, DR). Last visit 3 months ago. Insurance: [employer].  Insulin pump: -Previously OmniPod -Then Medtronic 670 G insulin pump but could not afford the Enlite CGM -Now T: slim X2-started 10/2019, but was off the pump for period of time before last visit  CGM: -Now Dexcom G6 -she was at last visit due to losing the transmitter  Insulin: -Sugars improved after her insurance switched her from Humalog to NovoLog  Reviewed HbA1c levels: Lab Results  Component Value Date   HGBA1C 9.1 (A) 12/04/2019   HGBA1C 10.2 (A) 08/31/2019   HGBA1C 9.2 (A) 05/01/2019   Pump settings: - basal rates: 12 am: 1 >> 0.8 >> 0.6 unit/h 2 pm: 0.7 - ICR:   12 am: 1:8 >> 1:14 - target: 130-130 >> 100-100 >> 110-110 - ISF: 35 >> 45 - Insulin on Board: 3 >> 5h >> 3h  She checks her sugars more than 4 times a day with her Dexcom CGM:   CGM parameters: - average: 234+/-68 >> 211 +/- 77.7% >> 209 >> 240 >> 259 - Glucose variability: 39.8% >> 39.4% >> 31.6% (ideal less than 36%) - Percentage time CGM active: 98% >> 92% - time in range:  - very low (<54): 0% >> 1% >> 0% - low (54-69): 3% >> 2% >> 1% - normal range (70-180): 37% >> 24% >> 16% - high sugars (181-250): 26% >> 26% >> 28% - very high sugars (>250): 34% >> 47% >> 55%    Previously:   Previously:   Lowest sugar was 27 in 07/2014... 40s >> 53 >> 40 >>  57.  She does not have  hypoglycemia awareness! Highest sugar was 601 .. >> 1200 (!!!) ...>> 400s >> 296. No history of hypoglycemia admissions..  She had a DKA admission in 09/2018.  She also had hyperglycemia ED visits and an admission in 12/2017.  -No CKD, last BUN/creatinine:  Lab Results  Component Value Date   BUN 13 02/21/2020   CREATININE 0.68 02/21/2020   -+ HL; last set of lipids: Lab Results  Component Value Date   CHOL 156 12/26/2018   HDL 44.80 12/26/2018   LDLCALC 94 12/26/2018   TRIG 86.0 12/26/2018   CHOLHDL 3 12/26/2018  On pravastatin 10. - last eye exam was in 03/2020: Reportedly + DR - stable -She has Numbness and tingling in feet and at last visit she was also complaining of this in hands.  She was previously on Neurontin but she stopped due to side effects.  Currently on Lyrica.  Previous B12 levels: Lab Results  Component Value Date   VITAMINB12 >1500 (H) 12/26/2018   VITAMINB12 481 12/06/2014  She stopped B12 after the above results returned  No history of hypothyroidism: Lab Results  Component Value Date   TSH 2.15 12/26/2018   On Diazepam.  Off Wellbutrin because of constipation.  She had tooth surgery in 01/2018, Dr. Britta Mccreedy.  She also had bilateral big toe surgery in 09/2016 with Dr.  Evans.  ROS: Constitutional: no weight gain/no weight loss, no fatigue, no subjective hyperthermia, no subjective hypothermia, urinary incontinence and enuresis Eyes: no blurry vision, no xerophthalmia ENT: no sore throat, no nodules palpated in neck, no dysphagia, no odynophagia, no hoarseness Cardiovascular: no CP/no SOB/no palpitations/no leg swelling Respiratory: no cough/no SOB/no wheezing Gastrointestinal: no N/no V/no D/no C/no acid reflux Musculoskeletal: + muscle aches/+ joint aches Skin: no rashes, no hair loss Neurological: no tremors/no numbness/no tingling/no dizziness  I reviewed pt's medications, allergies, PMH, social hx, family hx, and changes were documented in the  history of present illness. Otherwise, unchanged from my initial visit note.  Past Medical History:  Diagnosis Date  . Benign paroxysmal vertigo   . Cervicalgia   . Depression   . Diabetes ([place])    Type1  . Diabetes mellitus without complication ([place])   . GERD (gastroesophageal reflux disease)   . Hiatal hernia   . History of cardiac catheterization    Normal coronary arteries 2016  . History of TIA (transient ischemic attack)   . Hyperlipidemia   . Hypertension   . IBS (irritable bowel syndrome)   . Migraine headache   . Panic disorder with agoraphobia   . Peripheral neuropathy   . Subungual hematoma of digit of hand   . Tendonitis   . Vertigo    Past Surgical History:  Procedure Laterality Date  . ABDOMINAL HYSTERECTOMY    . APPENDECTOMY    . CARDIAC CATHETERIZATION   last in 2009   x 4, normal coronary arteries  . CARDIAC CATHETERIZATION N/A 03/26/2015   Procedure: Left Heart Cath and Coronary Angiography;  Surgeon: Wellington Hampshire, MD;  Location: [facility];  Service: Cardiovascular;  Laterality: N/A;  . CARPAL TUNNEL RELEASE Right 08/24/2018   Procedure: CARPAL TUNNEL RELEASE;  Surgeon: Carole Civil, MD;  Location: [facility];  Service: Orthopedics;  Laterality: Right;  . CESAREAN SECTION    . CHOLECYSTECTOMY    . COLONOSCOPY WITH PROPOFOL N/A 01/09/2018   Procedure: COLONOSCOPY WITH PROPOFOL;  Surgeon: Rogene Houston, MD;  [facility]: [facility];  Service: Endoscopy;  Laterality: N/A;  . COLONOSCOPY WITH PROPOFOL N/A 09/21/2019   Procedure: COLONOSCOPY WITH PROPOFOL;  Surgeon: Rogene Houston, MD;  [facility];  Service: Endoscopy;  Laterality: N/A;  1055  . CYST EXCISION     right breast  . ESOPHAGOGASTRODUODENOSCOPY (EGD) WITH PROPOFOL N/A 01/09/2018   Procedure: ESOPHAGOGASTRODUODENOSCOPY (EGD) WITH PROPOFOL;  Surgeon: Rogene Houston, MD;  [facility];  Service: Endoscopy;  Laterality: N/A;  . LEFT HEART CATH AND CORONARY  ANGIOGRAPHY N/A 02/25/2020   Procedure: LEFT HEART CATH AND CORONARY ANGIOGRAPHY;  Surgeon: Troy Sine, MD;  Location: [facility];  Service: Cardiovascular;  Laterality: N/A;  . POLYPECTOMY  09/21/2019   Procedure: POLYPECTOMY;  Surgeon: Rogene Houston, MD;  [facility];  Service: Endoscopy;;  ascending colon;  . 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 TUBAL LIGATION     History   Social History  . Marital Status: Divorced    Spouse Name: N/A  . Number of Children: 1   Occupational History  . disabled   Social History Main Topics  . Smoking status: Current Every Day Smoker -- 0.50 packs/day for 11  years  . Smokeless tobacco: Never Used     Comment: patient is aware that she needs to quit smoking  . Alcohol Use: No  . Drug Use: No   Current Outpatient Medications on File Prior to Visit  Medication Sig Dispense Refill  . diazepam (VALIUM) 5 MG tablet Take 5 mg by mouth 3 (three) times daily as needed for anxiety.     . docusate sodium (COLACE) 100 MG capsule Take 100-200 mg by mouth See admin instructions. Take 100 mg by mouth in the morning and 200 mg in the evening    . EPIPEN 2-PAK 0.3 MG/0.3ML SOAJ injection Inject 0.3 mg into the muscle as needed for anaphylaxis.     Marland Kitchen estradiol (ESTRACE) 1 MG tablet Take 1 mg by mouth daily.    . furosemide (LASIX) 40 MG tablet Take 40 mg by mouth daily.    Marland Kitchen GLUCAGEN HYPOKIT 1 MG SOLR injection Inject 1 mg into the skin once as needed for low blood sugar.     . Glucagon 3 MG/DOSE POWD Place 3 mg into the nose once as needed for up to 1 dose. (Patient taking differently: Place 3 mg into the nose once as needed (low blood). ) 1 each 11  . Insulin Human (INSULIN PUMP) SOLN Inject 1 each into the skin as directed. Humalog insulin     . Iron-Vitamin C (VITRON-C) 65-125 MG TABS Take 1 tablet by mouth daily.    . isosorbide mononitrate (IMDUR) 30 MG 24 hr tablet Take 1 tablet (30 mg total) by mouth daily. 90 tablet 1  . LINZESS 145 MCG CAPS capsule TAKE 1 CAPSULE BY MOUTH ONCE DAILY. 30 capsule 0  . lisinopril (PRINIVIL,ZESTRIL) 5 MG tablet Take 5 mg by mouth daily.  2  . Melatonin 5 MG TABS Take 10 mg by mouth at bedtime as needed (sleep).     . Omega-3 Fatty Acids (FISH OIL) 1200 MG CAPS Take 1,200 mg by mouth daily.     Marland Kitchen oxyCODONE-acetaminophen (PERCOCET) 10-325 MG tablet Take 1 tablet by mouth 4 (four) times daily as needed.    . polycarbophil (FIBERCON) 625 MG tablet Take 625 mg by mouth daily.    . pravastatin (PRAVACHOL) 10 MG tablet Take 10 mg by mouth at bedtime.  2  . pregabalin (LYRICA) 300 MG capsule Take 300 mg by mouth 2 (two) times daily.    . promethazine (PHENERGAN) 25 MG tablet Take 25 mg by mouth every 6 (six) hours as needed for nausea or vomiting.     . sennosides-docusate sodium (SENOKOT-S) 8.6-50 MG tablet Take 1 tablet by mouth daily as needed for constipation.    Marland Kitchen tiZANidine (ZANAFLEX) 4 MG tablet Take 4 mg by mouth every 8 (eight) hours as needed for muscle spasms.     . nitroGLYCERIN (NITROSTAT) 0.4 MG SL tablet Place 1 tablet (0.4 mg total) under the tongue every 5 (five) minutes x 3 doses as needed for chest pain (if no relief after 3rd dose, proceed to the ED for an evaluation or call 911). 25 tablet 3   No current facility-administered medications on file prior to visit.   Allergies  Allergen Reactions  . Bee Venom Anaphylaxis and Hives  . Zocor [Simvastatin] Other (See Comments)    Muscle aches and pains  . Clindamycin/Lincomycin Rash  . Codeine Rash  . Penicillins Rash    Did it involve swelling of the face/tongue/throat, SOB, or low BP? No Did it involve sudden or severe rash/hives,  skin peeling, or any reaction on the inside of your mouth or nose? Yes Did you need to seek medical  attention at a hospital or doctor's office? Yes When did it last happen?30 years If all above answers are "NO", may proceed with cephalosporin use.   . Sulfa Antibiotics Rash        Family History  Problem Relation Age of Onset  . Stroke Mother 52  . Heart attack Mother 20  . Cancer Sister        colon  . Heart failure Maternal Grandmother 65  . Cancer Maternal Grandfather        prostate  . Heart failure Paternal Grandmother 34  . Heart failure Paternal Grandfather 60   PE: BP 120/70   Pulse 79   Ht 5\' 7"  (1.702 m)   Wt 167 lb (75.8 kg)   SpO2 95%   BMI 26.16 kg/m  Body mass index is 26.16 kg/m. Wt Readings from Last 3 Encounters:  04/04/20 167 lb (75.8 kg)  02/25/20 165 lb (74.8 kg)  02/14/20 166 lb 6.4 oz (75.5 kg)   Constitutional: normal weight, in NAD Eyes: PERRLA, EOMI, no exophthalmos ENT: moist mucous membranes, no thyromegaly, no cervical lymphadenopathy Cardiovascular: RRR, No MRG Respiratory: CTA B Gastrointestinal: abdomen soft, NT, ND, BS+ Musculoskeletal: no deformities, strength intact in all 4 Skin: moist, warm, no rashes Neurological: no tremor with outstretched hands, DTR normal in all 4  ASSESSMENT: 1. DM1, insulin-dependent, uncontrolled, with complications - cerebro-vascular ds - h/o TIA 0/2016 - PN - DR Sees cardiology >> Dr. Maurice Small - [facility]  2. HL  3.  High B12  PLAN:  1. Patient with longstanding, uncontrolled, type 1 diabetes, previously on insulin pump, then off at the previous 2 visits after an episode of DKA in 09/2018 after suspicion of a stroke.  This was ruled out by MRI and also had normal carotid Dopplers afterwards.  Before last visit, she restarted on insulin pump as her diabetes control was much worse on the basal bolus insulin regimen.  She is currently on the t:slim insulin pump with the Dexcom CGM connected.  She started this in 10/2019.  Her sugars improved significantly after she started the pump but  at last visit she was off the CGM as she lost her transmitter during an MRI. -At last visit, she was dropping her sugars significantly before lunch and she felt that this was related to her basal rate so we decreased the basal rate from 12 AM to 2 PM.  We also changed her active insulin time from 5 hours to 3 hours.  Since last visit, she saw the diabetes educator and her pump settings were adjusted further. CGM interpretation: -At this visit, we reviewed together the CGM downloads and they are significantly improved!  She is now 74% in target which is excellent.  She is only 0% below target but 26% above target.  Reviewing the individual tracings, it appears that her sugars increase after breakfast and after dinner, and then well-controlled overnight.  Importantly, there are no times of the day at which she is dropping her sugars, which is also a significant improvement.  The CGM read a low blood sugar of 57 but this was an isolated incident. - Upon questioning, and upon reviewing her pump download, it appears that she is not bolusing consistently before meals.  She has consecutive days in which she is not bolusing at all and relies on automatic boluses for correction.  Also,  and the rest of the days, she is bolusing once or twice a day.  We discussed that this is not considered to good control.  She already had significant improvement in her sugars so for now, I recommended to try her best to bolus before every meal.  States she feels that she may drop her sugars after correction, I advised her to relax her insulin sensitivity factor.  I advised her that if the sugars are still high after meals after she boluses correctly, she may need to strengthen her ICR's with the respective meals. - I suggested to:  Patient Instructions  Please use the following pump settings: - basal rates: 12 am: 0.6 2 pm: 0.7 - ICR:   12 am: 1:14 (if sugars are high after meals after you start bolusing consistently, change the  ICR to 1:12) - target: 110-110 - ISF: 45 >> 50 - Active insulin time: 3 hours  Please do the following approximately 15 minutes before every meal: - Enter carbs (C) - Enter sugars (S) - Start insulin bolus (I)  Please stop at the lab.  Please return in 3 months.   - we checked her HbA1c: 8.6% - slightly better - advised to check sugars at different times of the day - 4x a day, rotating check times - advised for yearly eye exams >> she is UTD - return to clinic in 3-4 months  2. HL -Reviewed latest lipid panel from 12/2018: LDL above goal of less than 70, the rest of the fractions at goal: Lab Results  Component Value Date   CHOL 156 12/26/2018   HDL 44.80 12/26/2018   LDLCALC 94 12/26/2018   TRIG 86.0 12/26/2018   CHOLHDL 3 12/26/2018  -Continues the statin without side effects -Recheck lipid panel now  3.  High B12 -We checked her B12 vitamin in the setting of hand numbness.  This returned elevated >> we decreased her supplements dose.  - However, she is now off the B12 supplement -We will recheck a B12 level now  For her urinary incontinence and enuresis, I advised her to discuss with PCP about a possible referral to urology.  Component     Latest Ref Rng & Units 04/04/2020          Cholesterol     0 - 200 mg/dL 144  Triglycerides     0 - 149 mg/dL 87.0  HDL Cholesterol     >39.00 mg/dL 44.20  VLDL     0.0 - 40.0 mg/dL 17.4  LDL (calc)     0 - 99 mg/dL 82  Total CHOL/HDL Ratio      3  NonHDL      99.35  Vitamin B12     211 - 911 pg/mL >1526 (H)   LDL higher than goal of less than 70.  I would suggest to increase the pravastatin to 20 mg daily (will check with her if she is taking this consistently first) and will repeat her lipid panel at next visit.  Her B12 is still high.  Continue off B12 supplements.  Philemon Kingdom, MD PhD [facility]

## 2020-05-14 ENCOUNTER — Telehealth: Payer: Self-pay | Admitting: Internal Medicine

## 2020-05-14 NOTE — Telephone Encounter (Signed)
Medication Refill Request   Did you call your pharmacy and request this refill first? NO - this is an updated dosage   If patient has not contacted pharmacy first, instruct them to do so for future refills.   Remind them that contacting the pharmacy for their refill is the quickest method to get the refill.   Refill policy also stated that it will take anywhere between 24-72 hours to receive the refill.    Name of medication? Pravastatin 20MG  (not 10 MG - see lab results note to PT)  Is this a 90 day supply? YES  Name and location of pharmacy? [address], [place]

## 2020-05-15 ENCOUNTER — Other Ambulatory Visit: Payer: Self-pay

## 2020-05-15 MED ORDER — PRAVASTATIN SODIUM 10 MG PO TABS: 10.0000 mg | ORAL_TABLET | Freq: Every day | ORAL | 1 refills | Status: DC

## 2020-05-15 NOTE — Telephone Encounter (Signed)
RX sent to pharmacy. Thanks

## 2020-05-22 ENCOUNTER — Ambulatory Visit: Payer: Medicare Other | Admitting: Cardiology

## 2020-05-22 ENCOUNTER — Telehealth: Payer: Self-pay | Admitting: Internal Medicine

## 2020-05-22 ENCOUNTER — Encounter: Payer: Self-pay | Admitting: Cardiology

## 2020-05-22 ENCOUNTER — Other Ambulatory Visit: Payer: Self-pay

## 2020-05-22 MED ORDER — PRAVASTATIN SODIUM 20 MG PO TABS: 20.0000 mg | ORAL_TABLET | Freq: Every day | ORAL | 1 refills | Status: DC

## 2020-05-22 NOTE — Telephone Encounter (Signed)
Sent over RX to 20 mg per lab results.

## 2020-05-22 NOTE — Telephone Encounter (Signed)
Patient called asking if we could please call in pravastatin 20 mg instead of 10 mg - patient states Dr Darnell Level changed the dose to 20 last time she was here.  [place], [place]  [address] [place], [place] [address]  Phone:  [phone] Fax:  [phone]

## 2020-05-22 NOTE — Progress Notes (Deleted)
Cardiology Office Note  Date: 05/22/2020   ID: [name], DOB [birth year], MRN [id number]  PCP:  Denny Levy, [facility]  Cardiologist:  Rozann Lesches, MD Electrophysiologist:  None   No chief complaint on file.   History of Present Illness: [name] is a 49 y.o. female last seen in July by Mr. Leonides Sake NP.  She underwent a diagnostic cardiac catheterization in July of this year that revealed normal coronary arteries.  Past Medical History:  Diagnosis Date  . Benign paroxysmal vertigo   . Cervicalgia   . Depression   . Diabetes mellitus ([place])   . GERD (gastroesophageal reflux disease)   . Hiatal hernia   . History of cardiac catheterization    Normal coronary arteries 2016 and 2021  . History of TIA (transient ischemic attack)   . Hyperlipidemia   . Hypertension   . IBS (irritable bowel syndrome)   . Migraine headache   . Panic disorder with agoraphobia   . Peripheral neuropathy   . Subungual hematoma of digit of hand   . Tendonitis   . Vertigo     Past Surgical History:  Procedure Laterality Date  . ABDOMINAL HYSTERECTOMY    . APPENDECTOMY    . CARDIAC CATHETERIZATION   last in 2009   x 4, normal coronary arteries  . CARDIAC CATHETERIZATION N/A 03/26/2015   Procedure: Left Heart Cath and Coronary Angiography;  Surgeon: Wellington Hampshire, MD;  Location: [facility];  Service: Cardiovascular;  Laterality: N/A;  . CARPAL TUNNEL RELEASE Right 08/24/2018   Procedure: CARPAL TUNNEL RELEASE;  Surgeon: Carole Civil, MD;  Location: [facility];  Service: Orthopedics;  Laterality: Right;  . CESAREAN SECTION    . CHOLECYSTECTOMY    . COLONOSCOPY WITH PROPOFOL N/A 01/09/2018   Procedure: COLONOSCOPY WITH PROPOFOL;  Surgeon: Rogene Houston, MD;  [facility];  Service: Endoscopy;  Laterality: N/A;  . COLONOSCOPY WITH PROPOFOL N/A 09/21/2019   Procedure: COLONOSCOPY WITH PROPOFOL;  Surgeon: Rogene Houston, MD;  [facility];   Service: Endoscopy;  Laterality: N/A;  1055  . CYST EXCISION     right breast  . ESOPHAGOGASTRODUODENOSCOPY (EGD) WITH PROPOFOL N/A 01/09/2018   Procedure: ESOPHAGOGASTRODUODENOSCOPY (EGD) WITH PROPOFOL;  Surgeon: Rogene Houston, MD;  [facility];  Service: Endoscopy;  Laterality: N/A;  . LEFT HEART CATH AND CORONARY ANGIOGRAPHY N/A 02/25/2020   Procedure: LEFT HEART CATH AND CORONARY ANGIOGRAPHY;  Surgeon: Troy Sine, MD;  Location: [facility];  Service: Cardiovascular;  Laterality: N/A;  . POLYPECTOMY  09/21/2019   Procedure: POLYPECTOMY;  Surgeon: Rogene Houston, MD;  [facility];  Service: Endoscopy;;  ascending colon;  . 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 TUBAL LIGATION      Current Outpatient Medications  Medication Sig Dispense Refill  . diazepam (VALIUM) 5 MG tablet Take 5 mg by mouth 3 (three) times daily as needed for anxiety.     . docusate sodium (COLACE) 100 MG capsule Take 100-200 mg by mouth See admin instructions. Take 100 mg by mouth in the morning and 200 mg in the evening    . EPIPEN 2-PAK 0.3 MG/0.3ML SOAJ injection Inject 0.3 mg into the muscle as needed for anaphylaxis.     Marland Kitchen  estradiol (ESTRACE) 1 MG tablet Take 1 mg by mouth daily.    . furosemide (LASIX) 40 MG tablet Take 40 mg by mouth daily.    Marland Kitchen GLUCAGEN HYPOKIT 1 MG SOLR injection Inject 1 mg into the skin once as needed for low blood sugar.     . Glucagon 3 MG/DOSE POWD Place 3 mg into the nose once as needed for up to 1 dose. (Patient taking differently: Place 3 mg into the nose once as needed (low blood). ) 1 each 11  . Insulin Human (INSULIN PUMP) SOLN Inject 1 each into the skin as directed. Humalog insulin    . Iron-Vitamin C (VITRON-C) 65-125 MG TABS Take 1 tablet by mouth  daily.    . isosorbide mononitrate (IMDUR) 30 MG 24 hr tablet Take 1 tablet (30 mg total) by mouth daily. 90 tablet 1  . LINZESS 145 MCG CAPS capsule TAKE 1 CAPSULE BY MOUTH ONCE DAILY. 30 capsule 0  . lisinopril (PRINIVIL,ZESTRIL) 5 MG tablet Take 5 mg by mouth daily.  2  . Melatonin 5 MG TABS Take 10 mg by mouth at bedtime as needed (sleep).     . nitroGLYCERIN (NITROSTAT) 0.4 MG SL tablet Place 1 tablet (0.4 mg total) under the tongue every 5 (five) minutes x 3 doses as needed for chest pain (if no relief after 3rd dose, proceed to the ED for an evaluation or call 911). 25 tablet 3  . Omega-3 Fatty Acids (FISH OIL) 1200 MG CAPS Take 1,200 mg by mouth daily.     Marland Kitchen oxyCODONE-acetaminophen (PERCOCET) 10-325 MG tablet Take 1 tablet by mouth 4 (four) times daily as needed.    . polycarbophil (FIBERCON) 625 MG tablet Take 625 mg by mouth daily.    . pravastatin (PRAVACHOL) 10 MG tablet Take 1 tablet (10 mg total) by mouth at bedtime. 90 tablet 1  . pregabalin (LYRICA) 300 MG capsule Take 300 mg by mouth 2 (two) times daily.    . promethazine (PHENERGAN) 25 MG tablet Take 25 mg by mouth every 6 (six) hours as needed for nausea or vomiting.     . sennosides-docusate sodium (SENOKOT-S) 8.6-50 MG tablet Take 1 tablet by mouth daily as needed for constipation.    Marland Kitchen tiZANidine (ZANAFLEX) 4 MG tablet Take 4 mg by mouth every 8 (eight) hours as needed for muscle spasms.      No current facility-administered medications for this visit.   Allergies:  Bee venom, Zocor [simvastatin], Clindamycin/lincomycin, Codeine, Penicillins, and Sulfa antibiotics   Social History: The patient  reports that she has been smoking cigarettes. She has a 5.50 pack-year smoking history. She has never used smokeless tobacco. She reports current alcohol use. She reports that she does not use drugs.   Family History: The patient's family history includes Cancer in her maternal grandfather and sister; Heart attack (age of onset: 72)  in her mother; Heart failure (age of onset: 52) in her paternal grandfather; Heart failure (age of onset: 34) in her maternal grandmother; Heart failure (age of onset: 56) in her paternal grandmother; Stroke (age of onset: 30) in her mother.   ROS:  Please see the history of present illness. Otherwise, complete review of systems is positive for {NONE DEFAULTED:18576::"none"}.  All other systems are reviewed and negative.   Physical Exam: VS:  There were no vitals taken for this visit., BMI There is no height or weight on file to calculate BMI.  Wt Readings from Last 3 Encounters:  04/04/20 167  lb (75.8 kg)  02/25/20 165 lb (74.8 kg)  02/14/20 166 lb 6.4 oz (75.5 kg)    General: Patient appears comfortable at rest. HEENT: Conjunctiva and lids normal, oropharynx clear with moist mucosa. Neck: Supple, no elevated JVP or carotid bruits, no thyromegaly. Lungs: Clear to auscultation, nonlabored breathing at rest. Cardiac: Regular rate and rhythm, no S3 or significant systolic murmur, no pericardial rub. Abdomen: Soft, nontender, no hepatomegaly, bowel sounds present, no guarding or rebound. Extremities: No pitting edema, distal pulses 2+. Skin: Warm and dry. Musculoskeletal: No kyphosis. Neuropsychiatric: Alert and oriented x3, affect grossly appropriate.  ECG:  An ECG dated 02/14/2020 was personally reviewed today and demonstrated:  Sinus rhythm with significant lead motion artifact.  Recent Labwork: 11/16/2019: ALT 20; AST 21; B Natriuretic Peptide 28.0 02/21/2020: BUN 13; Creatinine, Ser 0.68; Hemoglobin 14.5; Platelets 173; Potassium 4.2; Sodium 138     Component Value Date/Time   CHOL 144 04/04/2020 1504   TRIG 87.0 04/04/2020 1504   HDL 44.20 04/04/2020 1504   CHOLHDL 3 04/04/2020 1504   VLDL 17.4 04/04/2020 1504   LDLCALC 82 04/04/2020 1504    Other Studies Reviewed Today:  Echocardiogram 11/29/2019: 1. Left ventricular ejection fraction, by estimation, is 60 to 65%. The  left  ventricle has normal function. The left ventricle has no regional  wall motion abnormalities. Left ventricular diastolic parameters were  normal.  2. Right ventricular systolic function is normal. The right ventricular  size is normal.  3. The mitral valve is normal in structure. No evidence of mitral valve  regurgitation. No evidence of mitral stenosis.  4. The aortic valve is tricuspid. Aortic valve regurgitation is not  visualized. No aortic stenosis is present.  5. The inferior vena cava is normal in size with greater than 50%  respiratory variability, suggesting right atrial pressure of 3 mmHg.   Cardiac catheterization 02/25/2020:  The left ventricular systolic function is normal.  LV end diastolic pressure is normal.   Normal epicardial coronary arteries in a dominant left coronary system.  Hyperdynamic LV function with EF at 65%; LVEDP 17 mmHg.  Assessment and Plan:    Medication Adjustments/Labs and Tests Ordered: Current medicines are reviewed at length with the patient today.  Concerns regarding medicines are outlined above.   Tests Ordered: No orders of the defined types were placed in this encounter.   Medication Changes: No orders of the defined types were placed in this encounter.   Disposition:  Follow up {follow up:15908}  Signed, Satira Sark, MD, [facility] 05/22/2020 9:18 AM    [facility] [address], [place], [place] [address] Phone: [phone]; Fax: [phone]

## 2020-07-08 ENCOUNTER — Ambulatory Visit: Payer: Medicare Other | Admitting: Internal Medicine

## 2020-07-08 NOTE — Progress Notes (Incomplete)
Patient ID: [name], female   DOB: 1971/06/04, 49 y.o.   MRN: [id number]  This visit occurred during the SARS-CoV-2 public health emergency.  Safety protocols were in place, including screening questions prior to the visit, additional usage of staff PPE, and extensive cleaning of exam room while observing appropriate contact time as indicated for disinfecting solutions.    HPI: [name] is a 49 y.o.-year-old female, returning for f/u for DM1, dx in 12 (49 y/o), uncontrolled, with complications (cerebro-vascular ds - h/o TIA 0/2016, PN, DR). Last visit 3 months ago. Insurance: [employer].  Insulin pump: -Previously OmniPod -Then Medtronic 670 G insulin pump but could not afford the Enlite CGM -Now T: slim X2-started 10/2019, but was off the pump for period of time before last visit  CGM: -Now Dexcom G6 -she was at last visit due to losing the transmitter  Insulin: -Sugars improved after her insurance switched her from Humalog to NovoLog  Reviewed HbA1c levels: Lab Results  Component Value Date   HGBA1C 8.6 (A) 04/04/2020   HGBA1C 9.1 (A) 12/04/2019   HGBA1C 10.2 (A) 08/31/2019   Pump settings: - basal rates: 12 am: 1 >> 0.8 >> 0.6 unit/h 2 pm: 0.7 - ICR:   12 am: 1:8 >> 1:14 - target: 130-130 >> 100-100 >> 110-110 - ISF: 35 >> 45 - Insulin on Board: 3 >> 5h >> 3h  She checks her sugars more than 4 times a day with her Dexcom CGM:   CGM parameters: - average: 234+/-68 >> 211 +/- 77.7% >> 209 >> 240 >> 259 - Glucose variability: 39.8% >> 39.4% >> 31.6% (ideal less than 36%) - Percentage time CGM active: 98% >> 92% - time in range:  - very low (<54): 0% >> 1% >> 0% - low (54-69): 3% >> 2% >> 1% - normal range (70-180): 37% >> 24% >> 16% - high sugars (181-250): 26% >> 26% >> 28% - very high sugars (>250): 34% >> 47% >> 55%    Previously:   Previously:   Lowest sugar was 27 in 07/2014... 40s >> 53 >> 40 >>  57.  She does not have  hypoglycemia awareness! Highest sugar was 601 .. >> 1200 (!!!) ...>> 400s >> 296. No history of hypoglycemia admissions..  She had a DKA admission in 09/2018.  She also had hyperglycemia ED visits and an admission in 12/2017.  -No CKD, last BUN/creatinine:  Lab Results  Component Value Date   BUN 13 02/21/2020   CREATININE 0.68 02/21/2020   -+ HL; last set of lipids: Lab Results  Component Value Date   CHOL 144 04/04/2020   HDL 44.20 04/04/2020   LDLCALC 82 04/04/2020   TRIG 87.0 04/04/2020   CHOLHDL 3 04/04/2020  On pravastatin 10. - last eye exam was in 03/2020: Reportedly + DR - stable -She has Numbness and tingling in feet and at last visit she was also complaining of this in hands.  She was previously on Neurontin but she stopped due to side effects.  Currently on Lyrica.  Previous B12 levels: Lab Results  Component Value Date   VITAMINB12 >1526 (H) 04/04/2020   VITAMINB12 >1500 (H) 12/26/2018   VITAMINB12 481 12/06/2014  She stopped B12 after the above results returned  No history of hypothyroidism: Lab Results  Component Value Date   TSH 2.15 12/26/2018   On Diazepam.  Off Wellbutrin because of constipation.  She had tooth surgery in 01/2018, Dr. Britta Mccreedy.  She also had bilateral big  toe surgery in 09/2016 with Dr. Amalia Hailey.  ROS: Constitutional: no weight gain/no weight loss, no fatigue, no subjective hyperthermia, no subjective hypothermia, urinary incontinence and enuresis Eyes: no blurry vision, no xerophthalmia ENT: no sore throat, no nodules palpated in neck, no dysphagia, no odynophagia, no hoarseness Cardiovascular: no CP/no SOB/no palpitations/no leg swelling Respiratory: no cough/no SOB/no wheezing Gastrointestinal: no N/no V/no D/no C/no acid reflux Musculoskeletal: + muscle aches/+ joint aches Skin: no rashes, no hair loss Neurological: no tremors/no numbness/no tingling/no dizziness  I reviewed pt's medications, allergies, PMH, social hx, family hx,  and changes were documented in the history of present illness. Otherwise, unchanged from my initial visit note.  Past Medical History:  Diagnosis Date  . Benign paroxysmal vertigo   . Cervicalgia   . Depression   . Diabetes mellitus ([place])   . GERD (gastroesophageal reflux disease)   . Hiatal hernia   . History of cardiac catheterization    Normal coronary arteries 2016 and 2021  . History of TIA (transient ischemic attack)   . Hyperlipidemia   . Hypertension   . IBS (irritable bowel syndrome)   . Migraine headache   . Panic disorder with agoraphobia   . Peripheral neuropathy   . Subungual hematoma of digit of hand   . Tendonitis   . Vertigo    Past Surgical History:  Procedure Laterality Date  . ABDOMINAL HYSTERECTOMY    . APPENDECTOMY    . CARDIAC CATHETERIZATION   last in 2009   x 4, normal coronary arteries  . CARDIAC CATHETERIZATION N/A 03/26/2015   Procedure: Left Heart Cath and Coronary Angiography;  Surgeon: Wellington Hampshire, MD;  Location: [facility];  Service: Cardiovascular;  Laterality: N/A;  . CARPAL TUNNEL RELEASE Right 08/24/2018   Procedure: CARPAL TUNNEL RELEASE;  Surgeon: Carole Civil, MD;  [facility];  Service: Orthopedics;  Laterality: Right;  . CESAREAN SECTION    . CHOLECYSTECTOMY    . COLONOSCOPY WITH PROPOFOL N/A 01/09/2018   Procedure: COLONOSCOPY WITH PROPOFOL;  Surgeon: Rogene Houston, MD;  [facility];  Service: Endoscopy;  Laterality: N/A;  . COLONOSCOPY WITH PROPOFOL N/A 09/21/2019   Procedure: COLONOSCOPY WITH PROPOFOL;  Surgeon: Rogene Houston, MD;  [facility];  Service: Endoscopy;  Laterality: N/A;  1055  . CYST EXCISION     right breast  . ESOPHAGOGASTRODUODENOSCOPY (EGD) WITH PROPOFOL N/A 01/09/2018   Procedure: ESOPHAGOGASTRODUODENOSCOPY (EGD) WITH PROPOFOL;  Surgeon: Rogene Houston, MD;  [facility];  Service: Endoscopy;  Laterality: N/A;  . LEFT HEART CATH AND CORONARY  ANGIOGRAPHY N/A 02/25/2020   Procedure: LEFT HEART CATH AND CORONARY ANGIOGRAPHY;  Surgeon: Troy Sine, MD;  Location: [facility];  Service: Cardiovascular;  Laterality: N/A;  . POLYPECTOMY  09/21/2019   Procedure: POLYPECTOMY;  Surgeon: Rogene Houston, MD;  [facility];  Service: Endoscopy;;  ascending colon;  . 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 TUBAL LIGATION     History   Social History  . Marital Status: Divorced    Spouse Name: N/A  . Number of Children: 1   Occupational History  . disabled   Social History Main Topics  . Smoking status: Current Every Day Smoker -- 0.50 packs/day for 11 years  .  Smokeless tobacco: Never Used     Comment: patient is aware that she needs to quit smoking  . Alcohol Use: No  . Drug Use: No   Current Outpatient Medications on File Prior to Visit  Medication Sig Dispense Refill  . diazepam (VALIUM) 5 MG tablet Take 5 mg by mouth 3 (three) times daily as needed for anxiety.     . docusate sodium (COLACE) 100 MG capsule Take 100-200 mg by mouth See admin instructions. Take 100 mg by mouth in the morning and 200 mg in the evening    . EPIPEN 2-PAK 0.3 MG/0.3ML SOAJ injection Inject 0.3 mg into the muscle as needed for anaphylaxis.     Marland Kitchen estradiol (ESTRACE) 1 MG tablet Take 1 mg by mouth daily.    . furosemide (LASIX) 40 MG tablet Take 40 mg by mouth daily.    Marland Kitchen GLUCAGEN HYPOKIT 1 MG SOLR injection Inject 1 mg into the skin once as needed for low blood sugar.     . Glucagon 3 MG/DOSE POWD Place 3 mg into the nose once as needed for up to 1 dose. (Patient taking differently: Place 3 mg into the nose once as needed (low blood). ) 1 each 11  . Insulin Human (INSULIN PUMP) SOLN Inject 1 each into the skin as directed. Humalog insulin     . Iron-Vitamin C (VITRON-C) 65-125 MG TABS Take 1 tablet by mouth daily.    . isosorbide mononitrate (IMDUR) 30 MG 24 hr tablet Take 1 tablet (30 mg total) by mouth daily. 90 tablet 1  . LINZESS 145 MCG CAPS capsule TAKE 1 CAPSULE BY MOUTH ONCE DAILY. 30 capsule 0  . lisinopril (PRINIVIL,ZESTRIL) 5 MG tablet Take 5 mg by mouth daily.  2  . Melatonin 5 MG TABS Take 10 mg by mouth at bedtime as needed (sleep).     . nitroGLYCERIN (NITROSTAT) 0.4 MG SL tablet Place 1 tablet (0.4 mg total) under the tongue every 5 (five) minutes x 3 doses as needed for chest pain (if no relief after 3rd dose, proceed to the ED for an evaluation or call 911). 25 tablet 3  . Omega-3 Fatty Acids (FISH OIL) 1200 MG CAPS Take 1,200 mg by mouth daily.     Marland Kitchen oxyCODONE-acetaminophen (PERCOCET) 10-325 MG tablet Take 1 tablet by mouth 4 (four) times daily as needed.    . polycarbophil (FIBERCON) 625 MG tablet Take 625 mg by mouth daily.    . pravastatin (PRAVACHOL) 20 MG tablet Take 1 tablet (20 mg total) by mouth at bedtime. 90 tablet 1  . pregabalin (LYRICA) 300 MG capsule Take 300 mg by mouth 2 (two) times daily.    . promethazine (PHENERGAN) 25 MG tablet Take 25 mg by mouth every 6 (six) hours as needed for nausea or vomiting.     . sennosides-docusate sodium (SENOKOT-S) 8.6-50 MG tablet Take 1 tablet by mouth daily as needed for constipation.    Marland Kitchen tiZANidine (ZANAFLEX) 4 MG tablet Take 4 mg by mouth every 8 (eight) hours as needed for muscle spasms.      No current facility-administered medications on file prior to visit.   Allergies  Allergen Reactions  . Bee Venom Anaphylaxis and Hives  . Zocor [Simvastatin] Other (See Comments)    Muscle aches and pains  . Clindamycin/Lincomycin Rash  . Codeine Rash  . Penicillins Rash    Did it involve swelling of the face/tongue/throat, SOB, or low BP? No Did it involve sudden or severe  rash/hives, skin peeling, or any reaction on the inside of your mouth or nose? Yes Did you  need to seek medical attention at a hospital or doctor's office? Yes When did it last happen?30 years If all above answers are "NO", may proceed with cephalosporin use.   . Sulfa Antibiotics Rash        Family History  Problem Relation Age of Onset  . Stroke Mother 84  . Heart attack Mother 67  . Cancer Sister        colon  . Heart failure Maternal Grandmother 65  . Cancer Maternal Grandfather        prostate  . Heart failure Paternal Grandmother 33  . Heart failure Paternal Grandfather 60   PE: There were no vitals taken for this visit. There is no height or weight on file to calculate BMI. Wt Readings from Last 3 Encounters:  04/04/20 167 lb (75.8 kg)  02/25/20 165 lb (74.8 kg)  02/14/20 166 lb 6.4 oz (75.5 kg)   Constitutional: normal weight, in NAD Eyes: PERRLA, EOMI, no exophthalmos ENT: moist mucous membranes, no thyromegaly, no cervical lymphadenopathy Cardiovascular: RRR, No MRG Respiratory: CTA B Gastrointestinal: abdomen soft, NT, ND, BS+ Musculoskeletal: no deformities, strength intact in all 4 Skin: moist, warm, no rashes Neurological: no tremor with outstretched hands, DTR normal in all 4  ASSESSMENT: 1. DM1, insulin-dependent, uncontrolled, with complications - cerebro-vascular ds - h/o TIA 0/2016 - PN - DR Sees cardiology >> Dr. Maurice Small - [facility]  2. HL  3.  High B12  PLAN:  1. Patient with longstanding, uncontrolled, type 1 diabetes, previously on insulin pump, then off at the previous 2 visits after an episode of DKA in 09/2018 after suspicion of a stroke.  This was ruled out by MRI and also had normal carotid Dopplers afterwards.  Before last visit, she restarted on insulin pump as her diabetes control was much worse on the basal bolus insulin regimen.  She is currently on the t:slim insulin pump with the Dexcom CGM connected.  She started this in 10/2019.  Her sugars improved significantly after she started the pump but at last visit  she was off the CGM as she lost her transmitter during an MRI. -At last visit, she was dropping her sugars significantly before lunch and she felt that this was related to her basal rate so we decreased the basal rate from 12 AM to 2 PM.  We also changed her active insulin time from 5 hours to 3 hours.  Since last visit, she saw the diabetes educator and her pump settings were adjusted further. CGM interpretation: -At this visit, we reviewed together the CGM downloads and they are significantly improved!  She is now 74% in target which is excellent.  She is only 0% below target but 26% above target.  Reviewing the individual tracings, it appears that her sugars increase after breakfast and after dinner, and then well-controlled overnight.  Importantly, there are no times of the day at which she is dropping her sugars, which is also a significant improvement.  The CGM read a low blood sugar of 57 but this was an isolated incident. - Upon questioning, and upon reviewing her pump download, it appears that she is not bolusing consistently before meals.  She has consecutive days in which she is not bolusing at all and relies on automatic boluses for correction.  Also, and the rest of the days, she is bolusing once or twice a day.  We  discussed that this is not considered to good control.  She already had significant improvement in her sugars so for now, I recommended to try her best to bolus before every meal.  States she feels that she may drop her sugars after correction, I advised her to relax her insulin sensitivity factor.  I advised her that if the sugars are still high after meals after she boluses correctly, she may need to strengthen her ICR's with the respective meals. - I suggested to:  Patient Instructions  Please use the following pump settings: - basal rates: 12 am: 0.6 2 pm: 0.7 - ICR:   12 am: 1:14 (if sugars are high after meals after you start bolusing consistently, change the ICR to 1:12)  - target: 110-110 - ISF: 45 >> 50 - Active insulin time: 3 hours  Please do the following approximately 15 minutes before every meal: - Enter carbs (C) - Enter sugars (S) - Start insulin bolus (I)  Please stop at the lab.  Please return in 3 months.   - we checked her HbA1c: 8.6% - slightly better - advised to check sugars at different times of the day - 4x a day, rotating check times - advised for yearly eye exams >> she is UTD - return to clinic in 3-4 months  2. HL -Reviewed latest lipid panel from 12/2018: LDL above goal of less than 70, the rest of the fractions at goal: Lab Results  Component Value Date   CHOL 144 04/04/2020   HDL 44.20 04/04/2020   LDLCALC 82 04/04/2020   TRIG 87.0 04/04/2020   CHOLHDL 3 04/04/2020  -Continues the statin without side effects -Recheck lipid panel now  3.  High B12 -We checked her B12 vitamin in the setting of hand numbness.  This returned elevated >> we decreased her supplements dose.  - However, she is now off the B12 supplement -We will recheck a B12 level now  For her urinary incontinence and enuresis, I advised her to discuss with PCP about a possible referral to urology.  Philemon Kingdom, MD PhD [facility]

## 2020-07-14 ENCOUNTER — Ambulatory Visit: Payer: Medicare Other | Admitting: Internal Medicine

## 2020-07-22 ENCOUNTER — Other Ambulatory Visit: Payer: Self-pay | Admitting: Internal Medicine

## 2020-07-22 DIAGNOSIS — E1065 Type 1 diabetes mellitus with hyperglycemia: Secondary | ICD-10-CM

## 2020-07-22 DIAGNOSIS — E1042 Type 1 diabetes mellitus with diabetic polyneuropathy: Secondary | ICD-10-CM

## 2020-07-28 ENCOUNTER — Other Ambulatory Visit: Payer: Self-pay | Admitting: Internal Medicine

## 2020-07-28 DIAGNOSIS — E1042 Type 1 diabetes mellitus with diabetic polyneuropathy: Secondary | ICD-10-CM

## 2020-07-28 DIAGNOSIS — E1065 Type 1 diabetes mellitus with hyperglycemia: Secondary | ICD-10-CM

## 2020-08-05 ENCOUNTER — Ambulatory Visit: Payer: Medicare Other | Admitting: Internal Medicine

## 2020-09-09 ENCOUNTER — Telehealth: Payer: Self-pay | Admitting: Internal Medicine

## 2020-09-09 NOTE — Telephone Encounter (Signed)
Pt called because her blood sugars have been dropping these past couple days continuously throughout the day and when pt tries to help lift up, an hour later it worsens.  This morning she woke up with her monitor beeping and her blood sugar was below 40. Pt requests a nurse give her a call regarding this.  Ph# [phone]

## 2020-09-10 NOTE — Telephone Encounter (Signed)
Called and spoke with pt, her sugars are stable when she wakes up but the last 2 days during the day while she is busy moving about her sugars are dropping low. The lowest has been 76 yesterday afternoon. She is bringing her sugars up but after an hour or 2 it drops again. Pt wanting to know what she can do to stabilize levels

## 2020-09-10 NOTE — Telephone Encounter (Signed)
T, I have not seen her in a while, so it is difficult to know exactly what the problem is, but for now, we can relax her insulin with meals and give her a little less insulin in the afternoon: Please use the following pump settings: - basal rates: 12 am: 0.6 2 pm: 0.7 >> 0.6 - ICR:              12 am: 1:14 >> 1:17 - target: 110-110 - ISF: 50 - Active insulin time: 3 hours I see that she has an appointment with me coming up, but not for 1 month.  If sugars continue to drop after the above changes, I want her to come back sooner or see Vaughan Basta.

## 2020-09-10 NOTE — Telephone Encounter (Signed)
Called and left a message for pt to call back to discuss pump settings

## 2020-09-12 ENCOUNTER — Other Ambulatory Visit: Payer: Self-pay | Admitting: Internal Medicine

## 2020-09-12 NOTE — Telephone Encounter (Signed)
Called and left a detailed message for pt regarding new setting for pump. Pt messaged via MyChart with settings.

## 2020-09-24 ENCOUNTER — Ambulatory Visit: Payer: Medicare Other | Admitting: Internal Medicine

## 2020-09-24 NOTE — Progress Notes (Deleted)
Patient ID: [name], female   DOB: Apr 20, 1971, 50 y.o.   MRN: [id number]  This visit occurred during the SARS-CoV-2 public health emergency.  Safety protocols were in place, including screening questions prior to the visit, additional usage of staff PPE, and extensive cleaning of exam room while observing appropriate contact time as indicated for disinfecting solutions.     HPI: [name] is a 50 y.o.-year-old female, returning for f/u for DM1, dx in 37 (50 y/o), uncontrolled, with complications (cerebro-vascular ds - h/o TIA 0/2016, PN, DR). Last visit 6 months ago. Insurance: [employer].  Insulin pump: -Previously OmniPod -Then Medtronic 670 G insulin pump but could not afford the Enlite CGM -Now T: slim X2-started 10/2019  CGM: -Dexcom G6  Insulin: -Sugars improved after her insurance switched coverage from Humalog to NovoLog  Reviewed HbA1c levels: Lab Results  Component Value Date   HGBA1C 8.6 (A) 04/04/2020   HGBA1C 9.1 (A) 12/04/2019   HGBA1C 10.2 (A) 08/31/2019   Pump settings -recently changed: - basal rates: 12 am: 0.6 2 pm: 0.7>>0.6 - ICR:  12 am: 1:14>>1:17 - target: 110-110 - ISF:50 - Active insulin time: 3 hours   She checks her sugars more than 4 times a day with her Dexcom CGM.  CGM parameters: - average: 234+/-68 >> 211 +/- 77.7% >> 209 >> 240 >> 259 - Glucose variability: 39.8% >> 39.4% >> 31.6% (ideal less than 36%) - Percentage time CGM active: 98% >> 92% - time in range:  - very low (<54): 0% >> 1% >> 0% - low (54-69): 3% >> 2% >> 1% - normal range (70-180): 37% >> 24% >> 16% - high sugars (181-250): 26% >> 26% >> 28% - very high sugars (>250): 34% >> 47% >> 55%  Previously:   Previously:   Previously:   Lowest sugar was 27 in 07/2014... 40s >> 53 >> 40 >>  57. No hypoglycemia awareness! Highest sugar was 601 .. >> 1200 (!!!) ...>> 400s >> 296. No history of hypoglycemia admissions.  She had a DKA  admission in 09/2018.  She also had hyperglycemia ED visits and an admission in 12/2017.  -No CKD, last BUN/creatinine:  Lab Results  Component Value Date   BUN 13 02/21/2020   CREATININE 0.68 02/21/2020   -+ HL; last set of lipids: Lab Results  Component Value Date   CHOL 144 04/04/2020   HDL 44.20 04/04/2020   LDLCALC 82 04/04/2020   TRIG 87.0 04/04/2020   CHOLHDL 3 04/04/2020  On pravastatin 20, increased from 10 mg daily at last visit. - last eye exam was in 03/2020: Reportedly + DR-stable -+ Numbness and tingling in feet and at last visit she was also complaining of this in hands.  She was previously on Neurontin but she stopped due to side effects. Currently on Lyrica.  Reviewed B12 levels: Lab Results  Component Value Date   VITAMINB12 >1526 (H) 04/04/2020   VITAMINB12 >1500 (H) 12/26/2018   VITAMINB12 481 12/06/2014  She stopped B12 in 2020.  No history of hypothyroidism: Lab Results  Component Value Date   TSH 2.15 12/26/2018   On Diazepam. Stop Wellbutrin due to constipation.  She had tooth surgery in 01/2018, Dr. Britta Mccreedy.  She also had bilateral big toe surgery in 09/2016 with Dr. Amalia Hailey.  ROS: Constitutional: no weight gain/no weight loss, no fatigue, no subjective hyperthermia, no subjective hypothermia, + increased urination Eyes: no blurry vision, no xerophthalmia ENT: no sore throat, no nodules palpated in neck,  no dysphagia, no odynophagia, no hoarseness Cardiovascular: no CP/no SOB/no palpitations/no leg swelling Respiratory: no cough/no SOB/no wheezing Gastrointestinal: no N/no V/no D/no C/no acid reflux Musculoskeletal: + muscle aches/+ joint aches Skin: no rashes, no hair loss Neurological: no tremors/+ numbness/+ tingling/no dizziness  I reviewed pt's medications, allergies, PMH, social hx, family hx, and changes were documented in the history of present illness. Otherwise, unchanged from my initial visit note.  Past Medical History:  Diagnosis  Date  . Benign paroxysmal vertigo   . Cervicalgia   . Depression   . Diabetes mellitus ([place])   . GERD (gastroesophageal reflux disease)   . Hiatal hernia   . History of cardiac catheterization    Normal coronary arteries 2016 and 2021  . History of TIA (transient ischemic attack)   . Hyperlipidemia   . Hypertension   . IBS (irritable bowel syndrome)   . Migraine headache   . Panic disorder with agoraphobia   . Peripheral neuropathy   . Subungual hematoma of digit of hand   . Tendonitis   . Vertigo    Past Surgical History:  Procedure Laterality Date  . ABDOMINAL HYSTERECTOMY    . APPENDECTOMY    . CARDIAC CATHETERIZATION   last in 2009   x 4, normal coronary arteries  . CARDIAC CATHETERIZATION N/A 03/26/2015   Procedure: Left Heart Cath and Coronary Angiography;  Surgeon: Wellington Hampshire, MD;  Location: [place] [facility];  Service: Cardiovascular;  Laterality: N/A;  . CARPAL TUNNEL RELEASE Right 08/24/2018   Procedure: CARPAL TUNNEL RELEASE;  Surgeon: Carole Civil, MD;  Location: [facility];  Service: Orthopedics;  Laterality: Right;  . CESAREAN SECTION    . CHOLECYSTECTOMY    . COLONOSCOPY WITH PROPOFOL N/A 01/09/2018   Procedure: COLONOSCOPY WITH PROPOFOL;  Surgeon: Rogene Houston, MD;  [facility]: [facility];  Service: Endoscopy;  Laterality: N/A;  . COLONOSCOPY WITH PROPOFOL N/A 09/21/2019   Procedure: COLONOSCOPY WITH PROPOFOL;  Surgeon: Rogene Houston, MD;  [facility]: [facility];  Service: Endoscopy;  Laterality: N/A;  1055  . CYST EXCISION     right breast  . ESOPHAGOGASTRODUODENOSCOPY (EGD) WITH PROPOFOL N/A 01/09/2018   Procedure: ESOPHAGOGASTRODUODENOSCOPY (EGD) WITH PROPOFOL;  Surgeon: Rogene Houston, MD;  [facility];  Service: Endoscopy;  Laterality: N/A;  . LEFT HEART CATH AND CORONARY ANGIOGRAPHY N/A 02/25/2020   Procedure: LEFT HEART CATH AND CORONARY ANGIOGRAPHY;  Surgeon: Troy Sine, MD;  Location: [place] [facility];  Service:  Cardiovascular;  Laterality: N/A;  . POLYPECTOMY  09/21/2019   Procedure: POLYPECTOMY;  Surgeon: Rogene Houston, MD;  [facility];  Service: Endoscopy;;  ascending colon;  . 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 TUBAL LIGATION     History   Social History  . Marital Status: Divorced    Spouse Name: N/A  . Number of Children: 1   Occupational History  . disabled   Social History Main Topics  . Smoking status: Current Every Day Smoker -- 0.50 packs/day for 11 years  . Smokeless tobacco: Never Used     Comment: patient is aware that she needs to quit smoking  . Alcohol Use: No  . Drug Use: No   Current Outpatient Medications on File Prior to Visit  Medication Sig  Dispense Refill  . diazepam (VALIUM) 5 MG tablet Take 5 mg by mouth 3 (three) times daily as needed for anxiety.     . docusate sodium (COLACE) 100 MG capsule Take 100-200 mg by mouth See admin instructions. Take 100 mg by mouth in the morning and 200 mg in the evening    . EPIPEN 2-PAK 0.3 MG/0.3ML SOAJ injection Inject 0.3 mg into the muscle as needed for anaphylaxis.     Marland Kitchen estradiol (ESTRACE) 1 MG tablet Take 1 mg by mouth daily.    . furosemide (LASIX) 40 MG tablet Take 40 mg by mouth daily.    Marland Kitchen GLUCAGEN HYPOKIT 1 MG SOLR injection Inject 1 mg into the skin once as needed for low blood sugar.     . Glucagon 3 MG/DOSE POWD Place 3 mg into the nose once as needed for up to 1 dose. (Patient taking differently: Place 3 mg into the nose once as needed (low blood). ) 1 each 11  . insulin aspart (NOVOLOG) 100 UNIT/ML injection INJECT 62 UNITS VIA PUMP DAILY. 50 mL 0  . Insulin Human (INSULIN PUMP) SOLN Inject 1 each into the skin as directed. Humalog insulin    . Iron-Vitamin C (VITRON-C) 65-125 MG TABS Take 1 tablet by  mouth daily.    . isosorbide mononitrate (IMDUR) 30 MG 24 hr tablet Take 1 tablet (30 mg total) by mouth daily. 90 tablet 1  . LANTUS SOLOSTAR 100 UNIT/ML Solostar Pen INJECT 25 UNITS IN THE MORNING AND 10 UNITS AT NIGHT. 15 mL 1  . LINZESS 145 MCG CAPS capsule TAKE 1 CAPSULE BY MOUTH ONCE DAILY. 30 capsule 0  . lisinopril (PRINIVIL,ZESTRIL) 5 MG tablet Take 5 mg by mouth daily.  2  . Melatonin 5 MG TABS Take 10 mg by mouth at bedtime as needed (sleep).     . nitroGLYCERIN (NITROSTAT) 0.4 MG SL tablet Place 1 tablet (0.4 mg total) under the tongue every 5 (five) minutes x 3 doses as needed for chest pain (if no relief after 3rd dose, proceed to the ED for an evaluation or call 911). 25 tablet 3  . Omega-3 Fatty Acids (FISH OIL) 1200 MG CAPS Take 1,200 mg by mouth daily.     Marland Kitchen oxyCODONE-acetaminophen (PERCOCET) 10-325 MG tablet Take 1 tablet by mouth 4 (four) times daily as needed.    . polycarbophil (FIBERCON) 625 MG tablet Take 625 mg by mouth daily.    . pravastatin (PRAVACHOL) 20 MG tablet Take 1 tablet (20 mg total) by mouth at bedtime. 90 tablet 1  . pregabalin (LYRICA) 300 MG capsule Take 300 mg by mouth 2 (two) times daily.    . promethazine (PHENERGAN) 25 MG tablet Take 25 mg by mouth every 6 (six) hours as needed for nausea or vomiting.     . sennosides-docusate sodium (SENOKOT-S) 8.6-50 MG tablet Take 1 tablet by mouth daily as needed for constipation.    Marland Kitchen tiZANidine (ZANAFLEX) 4 MG tablet Take 4 mg by mouth every 8 (eight) hours as needed for muscle spasms.      No current facility-administered medications on file prior to visit.   Allergies  Allergen Reactions  . Bee Venom Anaphylaxis and Hives  . Zocor [Simvastatin] Other (See Comments)    Muscle aches and pains  . Clindamycin/Lincomycin Rash  . Codeine Rash  . Penicillins Rash    Did it involve swelling of the face/tongue/throat, SOB, or low BP? No Did it involve sudden or severe rash/hives, skin  peeling, or any reaction on  the inside of your mouth or nose? Yes Did you need to seek medical attention at a hospital or doctor's office? Yes When did it last happen?30 years If all above answers are "NO", may proceed with cephalosporin use.   . Sulfa Antibiotics Rash        Family History  Problem Relation Age of Onset  . Stroke Mother 37  . Heart attack Mother 64  . Cancer Sister        colon  . Heart failure Maternal Grandmother 65  . Cancer Maternal Grandfather        prostate  . Heart failure Paternal Grandmother 41  . Heart failure Paternal Grandfather 60   PE: There were no vitals taken for this visit. There is no height or weight on file to calculate BMI. Wt Readings from Last 3 Encounters:  04/04/20 167 lb (75.8 kg)  02/25/20 165 lb (74.8 kg)  02/14/20 166 lb 6.4 oz (75.5 kg)   Constitutional: normal weight, in NAD Eyes: PERRLA, EOMI, no exophthalmos ENT: moist mucous membranes, no thyromegaly, no cervical lymphadenopathy Cardiovascular: RRR, No MRG Respiratory: CTA B Gastrointestinal: abdomen soft, NT, ND, BS+ Musculoskeletal: no deformities, strength intact in all 4 Skin: moist, warm, no rashes Neurological: no tremor with outstretched hands, DTR normal in all 4  ASSESSMENT: 1. DM1, insulin-dependent, uncontrolled, with complications - cerebro-vascular ds - h/o TIA 0/2016 - PN - DR Sees cardiology >> Dr. Maurice Small - [facility]  2. HL  3.  High B12  PLAN:  1. Patient with longstanding, uncontrolled, type 1 diabetes, previously on insulin pump been off after an episode of DKA 09/2018 after suspicion of stroke. This was ruled out by MRI and also had normal carotid Dopplers afterwards. She then restarted her insulin pump. She is currently on the t:slim insulin pump connected to the Dexcom G6 CGM. Her sugars improved significantly after she started the pump, but then came off the CGM after she lost her transmitter. Finally, at last visit, she was back on the CGM. Sugars were  significantly improved, with 74% of values in target range. At that time, she was not consistently bolusing before meals, even having consecutive days in which she was not bolusing at all and relied on automatic boluses for correction. Also, even when she was bolusing she was only doing so once or twice a day. I advised her to try her best to bolus before meals and we also relax her insulin sensitivity factor as she was dropping her sugars too much after correction. However, since then, she contacted me with low blood sugars and we decreased her basal rate in the afternoon and relax her insulin to carb ratio. CGM interpretation: -At today's visit, we reviewed her CGM downloads: It appears that ***% of values are in target range (goal >70%), while ***% are higher than 180 (goal <25%), and ***% are lower than 70 (goal <4%).  The calculated average blood sugar is ***.  The projected HbA1c for the next 3 months (GMI) is ***. -Reviewing the CGM trends, ***  - I suggested to:  Patient Instructions  Please use the following pump settings: - basal rates: 12 am: 0.6 2 pm: 0.6 - ICR:  12 am: 1:17 - target: 110-110 - ISF:50 - Active insulin time: 3 hours  Please do the following approximately 15 minutes before every meal: - Enter carbs (C) - Enter sugars (S) - Start insulin bolus (I)  Please stop at the lab.  Please return in 3 months.   - we checked her HbA1c: 7%  - advised to check sugars at different times of the day - 1x a day, rotating check times - advised for yearly eye exams >> she is UTD - return to clinic in 3-4 months  2. HL -Review latest lipid panel from 04/2020: LDL was slightly higher than our goal of <70 so we increased her pravastatin dose: Lab Results  Component Value Date   CHOL 144 04/04/2020   HDL 44.20 04/04/2020   LDLCALC 82 04/04/2020   TRIG 87.0 04/04/2020   CHOLHDL 3 04/04/2020  -Continues pravastatin 20 without side effects  3.  High B12 -We  checked her B12 vitamin in the setting of hand numbness.  This returned elevated >> she stopped B12 since then -We will check a B12 at next visit    Philemon Kingdom, MD PhD [facility]

## 2020-10-06 ENCOUNTER — Other Ambulatory Visit: Payer: Self-pay

## 2020-10-06 ENCOUNTER — Ambulatory Visit (INDEPENDENT_AMBULATORY_CARE_PROVIDER_SITE_OTHER): Payer: Medicare Other

## 2020-10-06 ENCOUNTER — Ambulatory Visit (INDEPENDENT_AMBULATORY_CARE_PROVIDER_SITE_OTHER): Payer: Medicare Other | Admitting: Podiatry

## 2020-10-06 DIAGNOSIS — S9031XA Contusion of right foot, initial encounter: Secondary | ICD-10-CM | POA: Diagnosis not present

## 2020-10-06 DIAGNOSIS — S9030XA Contusion of unspecified foot, initial encounter: Secondary | ICD-10-CM

## 2020-10-06 DIAGNOSIS — M7751 Other enthesopathy of right foot: Secondary | ICD-10-CM

## 2020-10-06 MED ORDER — MELOXICAM 15 MG PO TABS
15.0000 mg | ORAL_TABLET | Freq: Every day | ORAL | 1 refills | Status: DC
Start: 2020-10-06 — End: 2020-11-20

## 2020-10-17 ENCOUNTER — Encounter: Payer: Self-pay | Admitting: Internal Medicine

## 2020-10-17 ENCOUNTER — Ambulatory Visit (INDEPENDENT_AMBULATORY_CARE_PROVIDER_SITE_OTHER): Payer: Medicare Other | Admitting: Internal Medicine

## 2020-10-17 ENCOUNTER — Other Ambulatory Visit: Payer: Self-pay

## 2020-10-17 ENCOUNTER — Ambulatory Visit: Payer: Medicare Other | Admitting: Internal Medicine

## 2020-10-17 VITALS — BP 138/80 | HR 90 | Ht 67.0 in | Wt 174.2 lb

## 2020-10-17 DIAGNOSIS — E1065 Type 1 diabetes mellitus with hyperglycemia: Secondary | ICD-10-CM | POA: Diagnosis not present

## 2020-10-17 DIAGNOSIS — E1042 Type 1 diabetes mellitus with diabetic polyneuropathy: Secondary | ICD-10-CM | POA: Diagnosis not present

## 2020-10-17 DIAGNOSIS — E782 Mixed hyperlipidemia: Secondary | ICD-10-CM | POA: Diagnosis not present

## 2020-10-17 DIAGNOSIS — R7989 Other specified abnormal findings of blood chemistry: Secondary | ICD-10-CM | POA: Diagnosis not present

## 2020-10-17 LAB — POCT GLYCOSYLATED HEMOGLOBIN (HGB A1C): Hemoglobin A1C: 9.2 % — AB (ref 4.0–5.6)

## 2020-10-17 LAB — VITAMIN B12: Vitamin B-12: 650 pg/mL (ref 211–911)

## 2020-10-17 LAB — TSH: TSH: 4.26 u[IU]/mL (ref 0.35–4.50)

## 2020-10-17 NOTE — Progress Notes (Signed)
Patient ID: [name], female   DOB: [birth year], 50 y.o.   MRN: [id number]  This visit occurred during the SARS-CoV-2 public health emergency.  Safety protocols were in place, including screening questions prior to the visit, additional usage of staff PPE, and extensive cleaning of exam room while observing appropriate contact time as indicated for disinfecting solutions.     HPI: [name] is a 50 y.o.-year-old female, returning for f/u for DM1, dx in 75 (50 y/o), uncontrolled, with complications (cerebro-vascular ds - h/o TIA 0/2016, PN, DR). Last visit more than 6 months ago. Insurance: [employer].  Insulin pump: -Previously OmniPod -Then Medtronic 670 G insulin pump but could not afford the Enlite CGM -then T: slim X2-started 10/2019 >> stopped several days ago due to lack of supplies (delay in shipment)  CGM: -Dexcom G6  Insulin: -Sugars improved after her insurance switched coverage from Humalog to NovoLog  Supplies:  -[facility]  Reviewed HbA1c levels: Lab Results  Component Value Date   HGBA1C 8.6 (A) 04/04/2020   HGBA1C 9.1 (A) 12/04/2019   HGBA1C 10.2 (A) 08/31/2019   Pump settings: (She did not enter the settings suggested at last visit): - basal rates: 12 am: 0.6 2 pm: 0.7 - ICR:  12 am: 1:14 - target: 100-100 110-110 - ISF:50 - Active insulin time: 3 hours  Now on:  - Lantus 25 units in am and 10 units at night - Novolog ICR 1:17, target 150, ISF 1:25   Previously:   Previously:   Lowest sugar was 27 in 07/2014... 40s >> 53 >> 40 >>  57 >> 64. No hypoglycemia awareness! Highest sugar was 601 .. >> 1200 (!!!) ...>> 400s >> 296 >> 400. No history of hypoglycemia admissions.  She had a DKA admission in 09/2018.  She also had hyperglycemia ED visits and an admission in 12/2017.  -No CKD, last BUN/creatinine:  Lab Results  Component Value Date   BUN 13 02/21/2020   CREATININE 0.68 02/21/2020   -+ HL; last set of  lipids: Lab Results  Component Value Date   CHOL 144 04/04/2020   HDL 44.20 04/04/2020   LDLCALC 82 04/04/2020   TRIG 87.0 04/04/2020   CHOLHDL 3 04/04/2020  On pravastatin 20, increased from 10 mg daily at last visit. - last eye exam was in 03/2020: Reportedly + DR-stable -+ Numbness and tingling in feet and at last visit she was also complaining of this in hands.  She was previously on Neurontin but she stopped due to side effects. Currently on Lyrica.  Reviewed B12 levels: Lab Results  Component Value Date   VITAMINB12 >1526 (H) 04/04/2020   VITAMINB12 >1500 (H) 12/26/2018   VITAMINB12 481 12/06/2014  She stopped B12 in 2020.  No history of hypothyroidism: Lab Results  Component Value Date   TSH 2.15 12/26/2018   On Diazepam. Stop Wellbutrin due to constipation.  She had tooth surgery in 01/2018, Dr. Britta Mccreedy.  She also had bilateral big toe surgery in 09/2016 with Dr. Amalia Hailey.  ROS: Constitutional: no weight gain/no weight loss, no fatigue, no subjective hyperthermia, no subjective hypothermia, + increased urination Eyes: no blurry vision, no xerophthalmia ENT: no sore throat, no nodules palpated in neck, no dysphagia, no odynophagia, no hoarseness Cardiovascular: no CP/no SOB/no palpitations/no leg swelling Respiratory: no cough/no SOB/no wheezing Gastrointestinal: no N/no V/no D/no C/no acid reflux Musculoskeletal: + Muscle and joint aches Skin: no rashes, no hair loss Neurological: no tremors/+ numbness/+ tingling/no dizziness  I reviewed pt's medications,  allergies, PMH, social hx, family hx, and changes were documented in the history of present illness. Otherwise, unchanged from my initial visit note.  Past Medical History:  Diagnosis Date  . Benign paroxysmal vertigo   . Cervicalgia   . Depression   . Diabetes mellitus ([place])   . GERD (gastroesophageal reflux disease)   . Hiatal hernia   . History of cardiac catheterization    Normal coronary arteries 2016 and  2021  . History of TIA (transient ischemic attack)   . Hyperlipidemia   . Hypertension   . IBS (irritable bowel syndrome)   . Migraine headache   . Panic disorder with agoraphobia   . Peripheral neuropathy   . Subungual hematoma of digit of hand   . Tendonitis   . Vertigo    Past Surgical History:  Procedure Laterality Date  . ABDOMINAL HYSTERECTOMY    . APPENDECTOMY    . CARDIAC CATHETERIZATION   last in 2009   x 4, normal coronary arteries  . CARDIAC CATHETERIZATION N/A 03/26/2015   Procedure: Left Heart Cath and Coronary Angiography;  Surgeon: Wellington Hampshire, MD;  Location: [facility];  Service: Cardiovascular;  Laterality: N/A;  . CARPAL TUNNEL RELEASE Right 08/24/2018   Procedure: CARPAL TUNNEL RELEASE;  Surgeon: Carole Civil, MD;  Location: [facility];  Service: Orthopedics;  Laterality: Right;  . CESAREAN SECTION    . CHOLECYSTECTOMY    . COLONOSCOPY WITH PROPOFOL N/A 01/09/2018   Procedure: COLONOSCOPY WITH PROPOFOL;  Surgeon: Rogene Houston, MD;  [facility]: [facility];  Service: Endoscopy;  Laterality: N/A;  . COLONOSCOPY WITH PROPOFOL N/A 09/21/2019   Procedure: COLONOSCOPY WITH PROPOFOL;  Surgeon: Rogene Houston, MD;  [facility]: [facility];  Service: Endoscopy;  Laterality: N/A;  1055  . CYST EXCISION     right breast  . ESOPHAGOGASTRODUODENOSCOPY (EGD) WITH PROPOFOL N/A 01/09/2018   Procedure: ESOPHAGOGASTRODUODENOSCOPY (EGD) WITH PROPOFOL;  Surgeon: Rogene Houston, MD;  [facility];  Service: Endoscopy;  Laterality: N/A;  . LEFT HEART CATH AND CORONARY ANGIOGRAPHY N/A 02/25/2020   Procedure: LEFT HEART CATH AND CORONARY ANGIOGRAPHY;  Surgeon: Troy Sine, MD;  Location: [facility];  Service: Cardiovascular;  Laterality: N/A;  . POLYPECTOMY  09/21/2019   Procedure: POLYPECTOMY;  Surgeon: Rogene Houston, MD;  [facility];  Service: Endoscopy;;  ascending colon;  . 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 TUBAL LIGATION     History   Social History  . Marital Status: Divorced    Spouse Name: N/A  . Number of Children: 1   Occupational History  . disabled   Social History Main Topics  . Smoking status: Current Every Day Smoker -- 0.50 packs/day for 11 years  . Smokeless tobacco: Never Used     Comment: patient is aware that she needs to quit smoking  . Alcohol Use: No  . Drug Use: No   Current Outpatient Medications on File Prior to Visit  Medication Sig Dispense Refill  . diazepam (VALIUM) 5 MG tablet Take 5 mg by mouth 3 (three) times daily as needed for anxiety.     . docusate sodium (COLACE) 100 MG capsule Take 100-200 mg by mouth See admin instructions. Take 100 mg by mouth in the morning  and 200 mg in the evening    . EPIPEN 2-PAK 0.3 MG/0.3ML SOAJ injection Inject 0.3 mg into the muscle as needed for anaphylaxis.     Marland Kitchen estradiol (ESTRACE) 1 MG tablet Take 1 mg by mouth daily.    . furosemide (LASIX) 40 MG tablet Take 40 mg by mouth daily.    Marland Kitchen GLUCAGEN HYPOKIT 1 MG SOLR injection Inject 1 mg into the skin once as needed for low blood sugar.     . Glucagon 3 MG/DOSE POWD Place 3 mg into the nose once as needed for up to 1 dose. (Patient taking differently: Place 3 mg into the nose once as needed (low blood). ) 1 each 11  . insulin aspart (NOVOLOG) 100 UNIT/ML injection INJECT 62 UNITS VIA PUMP DAILY. 50 mL 0  . Insulin Human (INSULIN PUMP) SOLN Inject 1 each into the skin as directed. Humalog insulin    . Iron-Vitamin C (VITRON-C) 65-125 MG TABS Take 1 tablet by mouth daily.    . isosorbide mononitrate (IMDUR) 30 MG 24 hr tablet Take 1 tablet (30 mg total) by mouth daily. 90 tablet 1  . LANTUS SOLOSTAR 100 UNIT/ML Solostar Pen INJECT 25 UNITS IN THE MORNING AND 10 UNITS AT NIGHT. 15 mL 1  .  LINZESS 145 MCG CAPS capsule TAKE 1 CAPSULE BY MOUTH ONCE DAILY. 30 capsule 0  . lisinopril (PRINIVIL,ZESTRIL) 5 MG tablet Take 5 mg by mouth daily.  2  . Melatonin 5 MG TABS Take 10 mg by mouth at bedtime as needed (sleep).     . meloxicam (MOBIC) 15 MG tablet Take 1 tablet (15 mg total) by mouth daily. 30 tablet 1  . nitroGLYCERIN (NITROSTAT) 0.4 MG SL tablet Place 1 tablet (0.4 mg total) under the tongue every 5 (five) minutes x 3 doses as needed for chest pain (if no relief after 3rd dose, proceed to the ED for an evaluation or call 911). 25 tablet 3  . Omega-3 Fatty Acids (FISH OIL) 1200 MG CAPS Take 1,200 mg by mouth daily.     Marland Kitchen oxyCODONE-acetaminophen (PERCOCET) 10-325 MG tablet Take 1 tablet by mouth 4 (four) times daily as needed.    . polycarbophil (FIBERCON) 625 MG tablet Take 625 mg by mouth daily.    . pravastatin (PRAVACHOL) 20 MG tablet Take 1 tablet (20 mg total) by mouth at bedtime. 90 tablet 1  . pregabalin (LYRICA) 300 MG capsule Take 300 mg by mouth 2 (two) times daily.    . promethazine (PHENERGAN) 25 MG tablet Take 25 mg by mouth every 6 (six) hours as needed for nausea or vomiting.     . sennosides-docusate sodium (SENOKOT-S) 8.6-50 MG tablet Take 1 tablet by mouth daily as needed for constipation.    Marland Kitchen tiZANidine (ZANAFLEX) 4 MG tablet Take 4 mg by mouth every 8 (eight) hours as needed for muscle spasms.      No current facility-administered medications on file prior to visit.   Allergies  Allergen Reactions  . Bee Venom Anaphylaxis and Hives  . Zocor [Simvastatin] Other (See Comments)    Muscle aches and pains  . Clindamycin/Lincomycin Rash  . Codeine Rash  . Penicillins Rash    Did it involve swelling of the face/tongue/throat, SOB, or low BP? No Did it involve sudden or severe rash/hives, skin peeling, or any reaction on the inside of your mouth or nose? Yes Did you need to seek medical attention at a hospital or doctor's office? Yes When did it  last  happen?30 years If all above answers are "NO", may proceed with cephalosporin use.   . Sulfa Antibiotics Rash        Family History  Problem Relation Age of Onset  . Stroke Mother 9  . Heart attack Mother 39  . Cancer Sister        colon  . Heart failure Maternal Grandmother 65  . Cancer Maternal Grandfather        prostate  . Heart failure Paternal Grandmother 63  . Heart failure Paternal Grandfather 60   PE: There were no vitals taken for this visit. There is no height or weight on file to calculate BMI. Wt Readings from Last 3 Encounters:  04/04/20 167 lb (75.8 kg)  02/25/20 165 lb (74.8 kg)  02/14/20 166 lb 6.4 oz (75.5 kg)   Constitutional: normal weight, in NAD Eyes: PERRLA, EOMI, no exophthalmos ENT: moist mucous membranes, no thyromegaly, no cervical lymphadenopathy Cardiovascular: RRR, No MRG Respiratory: CTA B Gastrointestinal: abdomen soft, NT, ND, BS+ Musculoskeletal: no deformities, strength intact in all 4 Skin: moist, warm, no rashes Neurological: no tremor with outstretched hands, DTR normal in all 4  ASSESSMENT: 1. DM1, insulin-dependent, uncontrolled, with complications - cerebro-vascular ds - h/o TIA 0/2016 - PN - DR Sees cardiology >> Dr. Maurice Small - [facility]  2. HL  3.  High B12  PLAN:  1. Patient with longstanding, uncontrolled, type 1 diabetes, previously on insulin pump but off after an episode of DKA 09/2018 after suspicion of stroke.  This was ruled out by MRI and also had normal carotid Dopplers afterwards.  She then restarted the insulin pump.  She is currently on the t:slim insulin pump connected to the Dexcom G6 CGM.  Her sugars improved significantly after she started the pump. At last visit, however, she was not consistently bolusing before meals even having consecutive days in which she was not bolusing at all and relied on automatic boluses for correction.  Also, even when she was bolusing, she was only doing so once or  twice a day.  I advised her to try her best to bolus before every meal and we also relaxed her insulin sensitivity factor as she was dropping her sugars too much after correction.  However, since then, she contacted me with low blood sugars and we decreased her basal rates in the afternoon and relax her insulin to carb ratio.  She missed several appointments since last visit. -At this visit, she tells me that she ran out of supplies for both sensor and the pump 8 days ago.  However, we reviewed the 2 weeks of data of them before. CGM interpretation: -At today's visit, we reviewed her CGM downloads: It appears that 48% of values are in target range (goal >70%), while 52% are higher than 180 (goal <25%), and 0% are lower than 70 (goal <4%).  The calculated average blood sugar is 191. -Reviewing the CGM trends, the sugars are in the target interval in the second half of the night and in the afternoon but increase significantly at night and also after breakfast.  Reviewing individual choices it appears that her sugars increase with meals and she does not appear to be bolusing almost at all before meals.  She has consecutive days for which she does not enter any carbs and any mealtime boluses.  She relies almost exclusively on the automatic boluses for the pump.  Of course, these are not enough to cover her postprandial excursions and she has  at least hypoglycemia after meals.  We discussed that this is not conducive to good control and she absolutely needs to enter the carbs improved with every meal.  Since she is not using the pump parameters other than the basal rates, I could not adjust her ICR and ISF.  I did adjust her target up since he does complain that when she is more active, her sugars may drop.  We also discussed about using the exercise mode of the pump whenever active and possibly up to an hour after to avoid low blood sugars with activity. -I also advised her to call [facility] again and asked him to send  her pump and CGM supplies as soon as possible - I suggested to:  Patient Instructions  Please use the following pump settings: - basal rates: 12 am: 0.6 2 pm: 0.6 2 pm : 0.7 - ICR:  12 am: 1:14 - target: 100-100 >> 110-110 - ISF:50 - Active insulin time: 3 hours  Please do the following approximately 15 minutes before every meal: - Enter carbs (C) - Enter sugars (S) - Start insulin bolus (I)  Please use the exercise mode when more active.  Please return in 3-4 months.   - we checked her HbA1c: 9.2% (higher) - advised to check sugars at different times of the day - 4x a day, rotating check times - advised for yearly eye exams >> she is UTD - will check a TSH level today, otherwise she is up-to-date with annual labs - return to clinic in 3-4 months  2. HL -Reviewed latest panel from 04/2020: LDL slightly above target, the rest of the fractions at goal:: Lab Results  Component Value Date   CHOL 144 04/04/2020   HDL 44.20 04/04/2020   LDLCALC 82 04/04/2020   TRIG 87.0 04/04/2020   CHOLHDL 3 04/04/2020  -Continues pravastatin 20 without side effects  3.  High B12 -We checked a vitamin B12 in the setting of hand numbness.  This returned elevated.  She stopped B12 since then -We will check a B12 level today  Office Visit on 10/17/2020  Component Date Value Ref Range Status  . Hemoglobin A1C 10/17/2020 9.2* 4.0 - 5.6 % Final  . TSH 10/17/2020 4.26  0.35 - 4.50 uIU/mL Final  . Vitamin B-12 10/17/2020 650  211 - 911 pg/mL Final   Philemon Kingdom, MD PhD [facility]

## 2020-10-17 NOTE — Patient Instructions (Addendum)
Please use the following pump settings: - basal rates: 12 am: 0.6 2 pm: 0.6 2 pm : 0.7 - ICR:  12 am: 1:14 - target: 100-100 >> 110-110 - ISF:50 - Active insulin time: 3 hours  Please do the following approximately 15 minutes before every meal: - Enter carbs (C) - Enter sugars (S) - Start insulin bolus (I)  Please use the exercise mode when more active.  Please return in 3-4 months.

## 2020-10-19 DIAGNOSIS — M7751 Other enthesopathy of right foot: Secondary | ICD-10-CM | POA: Diagnosis not present

## 2020-10-19 MED ORDER — BETAMETHASONE SOD PHOS & ACET 6 (3-3) MG/ML IJ SUSP
3.0000 mg | Freq: Once | INTRAMUSCULAR | Status: AC
  Administered 2020-10-19: 3 mg via INTRA_ARTICULAR

## 2020-10-19 NOTE — Progress Notes (Signed)
   Subjective:  Patient presents today with a new complaint regarding pain and tenderness to the right forefoot the last 6-8 months.  She states that she is having pain all over the foot and to possibly concern of the big toes [place].  She presents for further treatment and evaluation.  H/0 bilateral cheilectomy and removal of hardware. DOS: 05/11/18.  Past Medical History:  Diagnosis Date  . Benign paroxysmal vertigo   . Cervicalgia   . Depression   . Diabetes mellitus ([place])   . GERD (gastroesophageal reflux disease)   . Hiatal hernia   . History of cardiac catheterization    Normal coronary arteries 2016 and 2021  . History of TIA (transient ischemic attack)   . Hyperlipidemia   . Hypertension   . IBS (irritable bowel syndrome)   . Migraine headache   . Panic disorder with agoraphobia   . Peripheral neuropathy   . Subungual hematoma of digit of hand   . Tendonitis   . Vertigo    Objective: Physical Exam General: The patient is alert and oriented x3 in no acute distress.  Dermatology: Skin is cool, dry and supple bilateral lower extremities. Negative for open lesions or macerations.  Vascular: Palpable pedal pulses bilaterally. No edema or erythema noted. Capillary refill within normal limits.  Neurological: Epicritic and protective threshold grossly intact bilaterally.   Musculoskeletal Exam: All pedal and ankle joints range of motion within normal limits bilateral. Muscle strength 5/5 in all groups bilateral.  Today there is pain on palpation and range of motion of the second MTPJ of the right foot.    Assessment: 1. s/p bilateral cheilectomy and removal of hardware. DOS: 05/11/18. 2.  Second MTPJ capsulitis right   Plan of Care:  1. Patient was evaluated.  2.  Injection of 0.5 cc Celestone Soluspan injected in second MTPJ right foot 3.  Prescription for meloxicam 15 mg daily 4.  Postsurgical shoe dispensed.  Wear daily 5.  Return to clinic in 4 weeks   Edrick Kins, DPM [facility]  Dr. Edrick Kins, DPM    [address], [place] [address]                Office [phone]  Fax [phone]

## 2020-10-23 ENCOUNTER — Telehealth: Payer: Self-pay | Admitting: Internal Medicine

## 2020-10-23 DIAGNOSIS — E1042 Type 1 diabetes mellitus with diabetic polyneuropathy: Secondary | ICD-10-CM

## 2020-10-23 DIAGNOSIS — E1065 Type 1 diabetes mellitus with hyperglycemia: Secondary | ICD-10-CM

## 2020-10-23 NOTE — Telephone Encounter (Signed)
[facility] called stating they sent in 2 rx requests for patients Dexcom Sensors.  Asked if we could please fax rx for sensors to [facility] at fax# [phone]

## 2020-10-27 NOTE — Telephone Encounter (Signed)
Patient called re: Patient states [facility] has been requesting RX for Dexcom Sensors since 10/14/20 with no response. Patient requests to be called at ph# [phone] for status of the above RX.

## 2020-10-28 MED ORDER — DEXCOM G6 SENSOR MISC: 3 refills | Status: DC

## 2020-10-28 NOTE — Telephone Encounter (Signed)
Rx sent to preferred pharamcy. Pt advised via Mychart message.

## 2020-11-03 ENCOUNTER — Ambulatory Visit (INDEPENDENT_AMBULATORY_CARE_PROVIDER_SITE_OTHER): Payer: Medicare Other | Admitting: Podiatry

## 2020-11-03 ENCOUNTER — Other Ambulatory Visit: Payer: Self-pay

## 2020-11-03 DIAGNOSIS — M19071 Primary osteoarthritis, right ankle and foot: Secondary | ICD-10-CM

## 2020-11-03 DIAGNOSIS — M7751 Other enthesopathy of right foot: Secondary | ICD-10-CM | POA: Diagnosis not present

## 2020-11-04 ENCOUNTER — Telehealth: Payer: Self-pay | Admitting: Urology

## 2020-11-04 NOTE — Telephone Encounter (Signed)
DOS - 11/20/20  OSTECT COMP MET HEAD 2ND RIGHT --- [address]  [facility] EFFECTIVE DATE - 08/02/20   PLAN DEDUCTIBLE - $0.00 W/ $0.00 REMAINING OUT OF POCKET - $7,550.00  W/ $7,550.00 REMAINING COPAY - $0.00 COINSURANCE - 20%   PER [employer] WEBSITE FOR CPT CODE 90931 Notification or Prior Authorization is not required for the requested services  Decision ID #:[id number]

## 2020-11-04 NOTE — Telephone Encounter (Signed)
[facility] called regarding the fax they sent on 3/31 for a prescription request for Dexcom G6 CGM. They are asking if we got it and for it please be completed and faxed back to them at the fax number provided on the paper.

## 2020-11-12 NOTE — Progress Notes (Signed)
   Subjective:  Patient presents today with a new complaint regarding pain and tenderness to the right forefoot the last 6-8 months.  Patient states that last visit when she received an injection it did not help her alleviate any of her symptoms.  She states the cam boot continues to hurt.  She presents for further treatment evaluation.  H/0 bilateral cheilectomy and removal of hardware. DOS: 05/11/18.  Past Medical History:  Diagnosis Date  . Benign paroxysmal vertigo   . Cervicalgia   . Depression   . Diabetes mellitus ([place])   . GERD (gastroesophageal reflux disease)   . Hiatal hernia   . History of cardiac catheterization    Normal coronary arteries 2016 and 2021  . History of TIA (transient ischemic attack)   . Hyperlipidemia   . Hypertension   . IBS (irritable bowel syndrome)   . Migraine headache   . Panic disorder with agoraphobia   . Peripheral neuropathy   . Subungual hematoma of digit of hand   . Tendonitis   . Vertigo    Objective: Physical Exam General: The patient is alert and oriented x3 in no acute distress.  Dermatology: Skin is cool, dry and supple bilateral lower extremities. Negative for open lesions or macerations.  Vascular: Palpable pedal pulses bilaterally. No edema or erythema noted. Capillary refill within normal limits.  Neurological: Epicritic and protective threshold grossly intact bilaterally.   Musculoskeletal Exam: All pedal and ankle joints range of motion within normal limits bilateral. Muscle strength 5/5 in all groups bilateral.  Today there is pain on palpation and range of motion of the second MTPJ of the right foot.   Radiographic exam taken prior visit: Degenerative changes noted to the second MTPJ of the right foot.  Flattening of the metatarsal head with periarticular spurring noted.   Assessment: 1. s/p bilateral cheilectomy and removal of hardware. DOS: 05/11/18. 2.  Second MTPJ capsulitis/DJD right   Plan of Care:  1. Patient  was evaluated.  2.  Patient states that the steroid injection did not help alleviate any of her symptoms.  Today we discussed surgery versus continued conservative management.  Patient would like to proceed with surgery. 3. Today we discussed the conservative versus surgical management of the presenting pathology. The patient opts for surgical management. All possible complications and details of the procedure were explained. All patient questions were answered. No guarantees were expressed or implied. 4. Authorization for surgery was initiated today. Surgery will consist of second metatarsal head resection right 5.  Return to clinic 1 week postop   Edrick Kins, DPM [facility]  Dr. Edrick Kins, DPM    [address], [place] [address]                Office [phone]  Fax [phone]

## 2020-11-20 ENCOUNTER — Other Ambulatory Visit: Payer: Self-pay | Admitting: Podiatry

## 2020-11-20 ENCOUNTER — Telehealth: Payer: Self-pay

## 2020-11-20 DIAGNOSIS — M7751 Other enthesopathy of right foot: Secondary | ICD-10-CM

## 2020-11-20 MED ORDER — MELOXICAM 15 MG PO TABS: 15.0000 mg | ORAL_TABLET | Freq: Every day | ORAL | 1 refills | Status: DC

## 2020-11-20 MED ORDER — OXYCODONE-ACETAMINOPHEN 5-325 MG PO TABS: 1.0000 | ORAL_TABLET | ORAL | 0 refills | Status: DC | PRN

## 2020-11-20 NOTE — Progress Notes (Signed)
PRN postop 

## 2020-11-20 NOTE — Telephone Encounter (Signed)
Pt would like some phenergan for nausea. Please advise

## 2020-11-21 ENCOUNTER — Other Ambulatory Visit: Payer: Self-pay | Admitting: Podiatry

## 2020-11-21 MED ORDER — PROMETHAZINE HCL 25 MG PO TABS: 25.0000 mg | ORAL_TABLET | Freq: Four times a day (QID) | ORAL | 0 refills | Status: DC | PRN

## 2020-11-21 NOTE — Progress Notes (Signed)
PRN postop N&V

## 2020-11-21 NOTE — Telephone Encounter (Signed)
Pt has been updated.  

## 2020-11-21 NOTE — Telephone Encounter (Signed)
Sent. - Dr. Reyanne Hussar

## 2020-11-26 ENCOUNTER — Ambulatory Visit (INDEPENDENT_AMBULATORY_CARE_PROVIDER_SITE_OTHER): Payer: Medicare Other

## 2020-11-26 ENCOUNTER — Ambulatory Visit (INDEPENDENT_AMBULATORY_CARE_PROVIDER_SITE_OTHER): Payer: Medicare Other | Admitting: Podiatry

## 2020-11-26 ENCOUNTER — Encounter: Payer: Medicare Other | Admitting: Podiatry

## 2020-11-26 ENCOUNTER — Other Ambulatory Visit: Payer: Self-pay

## 2020-11-26 DIAGNOSIS — M7751 Other enthesopathy of right foot: Secondary | ICD-10-CM

## 2020-11-26 DIAGNOSIS — Z9889 Other specified postprocedural states: Secondary | ICD-10-CM

## 2020-11-26 MED ORDER — OXYCODONE-ACETAMINOPHEN 5-325 MG PO TABS: 1.0000 | ORAL_TABLET | Freq: Four times a day (QID) | ORAL | 0 refills | Status: DC | PRN

## 2020-11-27 ENCOUNTER — Other Ambulatory Visit: Payer: Self-pay | Admitting: Internal Medicine

## 2020-12-03 ENCOUNTER — Other Ambulatory Visit: Payer: Self-pay

## 2020-12-03 ENCOUNTER — Ambulatory Visit (INDEPENDENT_AMBULATORY_CARE_PROVIDER_SITE_OTHER): Payer: Medicare Other | Admitting: Podiatry

## 2020-12-03 DIAGNOSIS — Z9889 Other specified postprocedural states: Secondary | ICD-10-CM

## 2020-12-08 ENCOUNTER — Encounter: Payer: Medicare Other | Admitting: Podiatry

## 2020-12-09 NOTE — Progress Notes (Signed)
   Subjective:  Patient presents today status post second metatarsal head resection right. DOS: 11/20/2020.  Patient states that she is doing well.  She has had some swelling since surgery but otherwise no new complaints at this time.  Past Medical History:  Diagnosis Date  . Benign paroxysmal vertigo   . Cervicalgia   . Depression   . Diabetes mellitus ([place])   . GERD (gastroesophageal reflux disease)   . Hiatal hernia   . History of cardiac catheterization    Normal coronary arteries 2016 and 2021  . History of TIA (transient ischemic attack)   . Hyperlipidemia   . Hypertension   . IBS (irritable bowel syndrome)   . Migraine headache   . Panic disorder with agoraphobia   . Peripheral neuropathy   . Subungual hematoma of digit of hand   . Tendonitis   . Vertigo       Objective/Physical Exam Neurovascular status intact.  Skin incisions appear to be well coapted with staples intact. No sign of infectious process noted. No dehiscence. No active bleeding noted. Moderate edema noted to the surgical extremity.  Radiographic Exam:  Osteotomy to the neck of the second metatarsal appear to be stable with routine healing.  Absence of the second metatarsal head noted  Assessment: 1. s/p second metatarsal head resection right. DOS: 11/20/2020   Plan of Care:  1. Patient was evaluated. X-rays reviewed.  Continue weightbearing in the postsurgical shoe 2.  Dressings changed today.  Keep clean dry and intact x1 week 3.  Refill prescription for Percocet 5/325 mg as needed pain 4.  Return to clinic in 1 week  *Going to [employer] next Friday  Edrick Kins, DPM [facility]  Dr. Edrick Kins, DPM    [address], [place] [address]                Office [phone]  Fax [phone]

## 2020-12-12 ENCOUNTER — Telehealth: Payer: Self-pay

## 2020-12-12 NOTE — Telephone Encounter (Signed)
Fax for CGM supplies was completed and faxed over on 12/09/20.

## 2020-12-17 ENCOUNTER — Ambulatory Visit (INDEPENDENT_AMBULATORY_CARE_PROVIDER_SITE_OTHER): Payer: Medicare Other | Admitting: Podiatry

## 2020-12-17 ENCOUNTER — Other Ambulatory Visit: Payer: Self-pay

## 2020-12-17 DIAGNOSIS — Z9889 Other specified postprocedural states: Secondary | ICD-10-CM

## 2020-12-17 DIAGNOSIS — M7751 Other enthesopathy of right foot: Secondary | ICD-10-CM

## 2020-12-17 DIAGNOSIS — M19071 Primary osteoarthritis, right ankle and foot: Secondary | ICD-10-CM

## 2020-12-17 MED ORDER — DOXYCYCLINE HYCLATE 100 MG PO TABS: 100.0000 mg | ORAL_TABLET | Freq: Two times a day (BID) | ORAL | 0 refills | Status: DC

## 2020-12-17 NOTE — Progress Notes (Signed)
   Subjective:  Patient presents today status post second metatarsal head resection right. DOS: 11/20/2020.  Patient states that she is doing well.  She has had some swelling since surgery but otherwise no new complaints at this time.  Past Medical History:  Diagnosis Date  . Benign paroxysmal vertigo   . Cervicalgia   . Depression   . Diabetes mellitus ([place])   . GERD (gastroesophageal reflux disease)   . Hiatal hernia   . History of cardiac catheterization    Normal coronary arteries 2016 and 2021  . History of TIA (transient ischemic attack)   . Hyperlipidemia   . Hypertension   . IBS (irritable bowel syndrome)   . Migraine headache   . Panic disorder with agoraphobia   . Peripheral neuropathy   . Subungual hematoma of digit of hand   . Tendonitis   . Vertigo       Objective/Physical Exam Neurovascular status intact.  Skin incisions appear to be well coapted with staples intact. No sign of infectious process noted. No dehiscence. No active bleeding noted. Moderate edema noted to the surgical extremity.  Assessment: 1. s/p second metatarsal head resection right. DOS: 11/20/2020   Plan of Care:  1. Patient was evaluated.  2.  Although I do not feel any fluctuance or purulence or underlying deep abscess, will call and a prescription for doxycycline 100 mg 2 times daily #20 since the patient is diabetic 3.  Continue wearing good supportive shoes and sneakers  4.  Return to clinic in 4 weeks  *Going to [employer] next Friday  Edrick Kins, DPM [facility]  Dr. Edrick Kins, DPM    [address], [place] [address]                Office [phone]  Fax [phone]

## 2020-12-18 NOTE — Progress Notes (Signed)
   Subjective:  Patient presents today status post second metatarsal head resection right.. DOS: 11/20/2020.  Patient states that she is doing well.  The pain has resolved and she is weightbearing as tolerated.  She states that the swelling is under control.  Past Medical History:  Diagnosis Date  . Benign paroxysmal vertigo   . Cervicalgia   . Depression   . Diabetes mellitus ([place])   . GERD (gastroesophageal reflux disease)   . Hiatal hernia   . History of cardiac catheterization    Normal coronary arteries 2016 and 2021  . History of TIA (transient ischemic attack)   . Hyperlipidemia   . Hypertension   . IBS (irritable bowel syndrome)   . Migraine headache   . Panic disorder with agoraphobia   . Peripheral neuropathy   . Subungual hematoma of digit of hand   . Tendonitis   . Vertigo       Objective/Physical Exam Neurovascular status intact.  Skin incisions appear to be well coapted with suture intact. No sign of infectious process noted. No dehiscence. No active bleeding noted. Moderate edema noted to the surgical extremity.  Assessment: 1. s/p second metatarsal head resection right. DOS: 11/20/2020   Plan of Care:  1. Patient was evaluated.  2.  Sutures removed today 3.  Patient is doing very well.  She may slowly transition into full activity with no restrictions 4.  Recommend good supportive shoes or sneakers 5.  Return to clinic as needed   Edrick Kins, DPM [facility]  Dr. Edrick Kins, DPM    [address], [place] [address]                Office [phone]  Fax [phone]

## 2020-12-22 ENCOUNTER — Encounter: Payer: Medicare Other | Admitting: Podiatry

## 2020-12-30 ENCOUNTER — Other Ambulatory Visit: Payer: Self-pay | Admitting: Internal Medicine

## 2020-12-30 DIAGNOSIS — E1065 Type 1 diabetes mellitus with hyperglycemia: Secondary | ICD-10-CM

## 2020-12-30 DIAGNOSIS — E1042 Type 1 diabetes mellitus with diabetic polyneuropathy: Secondary | ICD-10-CM

## 2020-12-31 ENCOUNTER — Other Ambulatory Visit: Payer: Self-pay

## 2020-12-31 ENCOUNTER — Encounter: Payer: Medicare Other | Attending: Internal Medicine | Admitting: Nutrition

## 2020-12-31 NOTE — Patient Instructions (Signed)
Try to link data to your phone.  If not able to, call Tandem help line.

## 2020-12-31 NOTE — Progress Notes (Signed)
Patient is here at her request to restart her Tandem pump and Dexcom.  She has been off her pump for "a long time".  Settings were checked and are consistent with Dr. Arman Filter last office note.  Date and time were put in, a new Dexcom sensor was started with a new transmitter, and linked to her pump.  Her pump was linked to the T-Connect account, but the data would not transfer to her T-Connect account on her phone.  She said she would try this several more times and call Tandem if this does not transfer.  A cartridge was filled by the patient and she inserted a infusion set into her right lower abdomen without hesitation.  We reviewed how to bolus and she reported good understanding of this.  She had no final questions. Patient reports that she has been under a lot of stress, and is not sleeping or eating well.  Says FBS today was close to 500. She has been doing injections and forgetting at times.  She was shown again how to do a correction dose, and reminded that if her blood sugars are high, she needs to give a correction bolus if not eating.  She reported good understanding of this, and had no final questions.

## 2021-01-01 ENCOUNTER — Other Ambulatory Visit: Payer: Self-pay | Admitting: Internal Medicine

## 2021-01-01 MED ORDER — INSULIN ASPART 100 UNIT/ML IJ SOLN
INTRAMUSCULAR | 3 refills | Status: DC
Start: 2021-01-01 — End: 2022-02-22

## 2021-01-05 ENCOUNTER — Other Ambulatory Visit: Payer: Self-pay | Admitting: Internal Medicine

## 2021-01-05 ENCOUNTER — Other Ambulatory Visit: Payer: Self-pay | Admitting: Podiatry

## 2021-01-07 ENCOUNTER — Telehealth: Payer: Self-pay | Admitting: Nutrition

## 2021-01-07 NOTE — Telephone Encounter (Signed)
Patient reports that she is doing "ok" on her pump.  She has had only 2 lows, since starting back, but she thinks this is because she didn't eat enough.  She had no questions for me at this time.

## 2021-01-08 ENCOUNTER — Other Ambulatory Visit: Payer: Self-pay | Admitting: Internal Medicine

## 2021-01-08 ENCOUNTER — Encounter: Payer: Self-pay | Admitting: Podiatry

## 2021-01-19 ENCOUNTER — Encounter: Payer: Medicare Other | Admitting: Podiatry

## 2021-01-26 ENCOUNTER — Encounter: Payer: Medicare Other | Admitting: Podiatry

## 2021-02-09 ENCOUNTER — Encounter: Payer: Medicare Other | Admitting: Podiatry

## 2021-02-09 ENCOUNTER — Telehealth: Payer: Self-pay | Admitting: Nutrition

## 2021-02-09 ENCOUNTER — Other Ambulatory Visit: Payer: Self-pay | Admitting: Internal Medicine

## 2021-02-09 NOTE — Telephone Encounter (Signed)
LVM that I am needing to reschedule our appointment for the 26th.  Gave times for this Wednesday and on the 27th, with a telephone number to call back.

## 2021-02-11 ENCOUNTER — Telehealth: Payer: Self-pay | Admitting: Internal Medicine

## 2021-02-13 NOTE — Telephone Encounter (Signed)
Called and left a message for pt advising rx was sent to pharmacy with 3 refills and pt should be able to refill at pharmacy.

## 2021-02-13 NOTE — Telephone Encounter (Signed)
Patient called to check status of this refill to [facility] in [place].  Will be using the last of her insulin to fill her pump tomorrow and will be out at that time  Call back # [phone]

## 2021-02-16 ENCOUNTER — Encounter: Payer: Self-pay | Admitting: Internal Medicine

## 2021-02-16 ENCOUNTER — Other Ambulatory Visit: Payer: Self-pay

## 2021-02-16 ENCOUNTER — Ambulatory Visit (INDEPENDENT_AMBULATORY_CARE_PROVIDER_SITE_OTHER): Payer: Medicare Other | Admitting: Internal Medicine

## 2021-02-16 VITALS — BP 110/70 | HR 73 | Ht 67.0 in | Wt 176.8 lb

## 2021-02-16 DIAGNOSIS — R7989 Other specified abnormal findings of blood chemistry: Secondary | ICD-10-CM

## 2021-02-16 DIAGNOSIS — E782 Mixed hyperlipidemia: Secondary | ICD-10-CM | POA: Diagnosis not present

## 2021-02-16 DIAGNOSIS — E1042 Type 1 diabetes mellitus with diabetic polyneuropathy: Secondary | ICD-10-CM | POA: Diagnosis not present

## 2021-02-16 DIAGNOSIS — E1065 Type 1 diabetes mellitus with hyperglycemia: Secondary | ICD-10-CM

## 2021-02-16 LAB — POCT GLYCOSYLATED HEMOGLOBIN (HGB A1C): Hemoglobin A1C: 9.5 % — AB (ref 4.0–5.6)

## 2021-02-16 MED ORDER — GLUCAGON 3 MG/DOSE NA POWD: 3.0000 mg | Freq: Once | NASAL | 11 refills | Status: DC | PRN

## 2021-02-16 NOTE — Patient Instructions (Addendum)
Please use the following pump settings: - basal rates: 12 am: 0.6 2 pm: 0.6 2 pm : 0.7 - ICR:              12 am: 1:14 - target: 100-100 - ISF: 50 - Active insulin time: 3 hours  Please do the following approximately 15 minutes before every meal: - Enter carbs (C) - Enter sugars (S) - Start insulin bolus (I)  Please return in 3-4 months.

## 2021-02-16 NOTE — Progress Notes (Signed)
Patient ID: [name], female   DOB: 10/02/1970, 50 y.o.   MRN: [id number]  This visit occurred during the SARS-CoV-2 public health emergency.  Safety protocols were in place, including screening questions prior to the visit, additional usage of staff PPE, and extensive cleaning of exam room while observing appropriate contact time as indicated for disinfecting solutions.     HPI: [name] is a 50 y.o.-year-old female, returning for f/u for DM1, dx in 78 (50 y/o), uncontrolled, with complications (cerebro-vascular ds - h/o TIA 0/2016, PN, DR). Last visit 4 months ago. She is here with her stepmother.  Insurance: [employer].  Interim history: No increased urination, blurry vision, nausea, chest pain.  She continues to have numbness and tingling in her feet. She has right hip pain ("pinched nerve") for 1 mo >> getting worse. Cannot sleep, cannot walk for a long distance - in wheelchair now.   Insulin pump: -Previously OmniPod -Then Medtronic 670 G insulin pump but could not afford the Enlite CGM -then T: slim X2-started 10/2019 >> off at last visit due to running out of supplies (delay in shipment).  CGM: -Dexcom G6  Insulin: -Sugars improved after her insurance switched coverage from Humalog to NovoLog  Supplies:  -[facility]  Reviewed HbA1c levels: Lab Results  Component Value Date   HGBA1C 9.2 (A) 10/17/2020   HGBA1C 8.6 (A) 04/04/2020   HGBA1C 9.1 (A) 12/04/2019   At last visit, she was on: - Lantus 25 units in am and 10 units at night - Novolog ICR 1:17, target 150, ISF 1:25  Now back on insulin pump: Pump settings:  - basal rates: 12 am: 0.6 2 pm: 0.6 2 pm : 0.7 - ICR:              12 am: 1:14 - target: 100-100  (she forgot to change this) - ISF: 50 - Active insulin time: 3 hours    Previously:   Lowest sugar was 27 in 07/2014... 0 >>  57 >> 64 >> 88. No hypoglycemia awareness!  She has a glucagon pen at home. Highest sugar was 601 .. >> 1200  (!!!) ...>> 296 >> 400 >> 500 (infusion site pb). No history of hypoglycemia admissions.  She had a DKA admission in 09/2018.  She also had hyperglycemia ED visits and an admission in 12/2017.  -No CKD, last BUN/creatinine:  Lab Results  Component Value Date   BUN 13 02/21/2020   CREATININE 0.68 02/21/2020  On Lisinopril 5 mg daily.  -+ HL; last set of lipids: Lab Results  Component Value Date   CHOL 144 04/04/2020   HDL 44.20 04/04/2020   LDLCALC 82 04/04/2020   TRIG 87.0 04/04/2020   CHOLHDL 3 04/04/2020  On pravastatin 20, fish oil 1200 mg daily.  - last eye exam was in 03/2020: Reportedly + DR-stable  -+ Numbness and tingling in feet and hands. She was previously on Neurontin but she stopped due to side effects. Currently on Lyrica.  Reviewed B12 levels: Lab Results  Component Value Date   VITAMINB12 650 10/17/2020   VITAMINB12 >1526 (H) 04/04/2020   VITAMINB12 >1500 (H) 12/26/2018   VITAMINB12 481 12/06/2014  She stopped B12 in 2020.  No history of hypothyroidism: Lab Results  Component Value Date   TSH 4.26 10/17/2020   On Diazepam. Stopped Wellbutrin due to constipation. She had tooth surgery in 01/2018, Dr. Britta Mckenzie.   She had bilateral big toe surgery in 09/2016 with Dr. Amalia Mckenzie.  ROS: + See  HPI  I reviewed pt's medications, allergies, PMH, social hx, family hx, and changes were documented in the history of present illness. Otherwise, unchanged from my initial visit note.  Past Medical History:  Diagnosis Date   Benign paroxysmal vertigo    Cervicalgia    Depression    Diabetes mellitus ([facility])    GERD (gastroesophageal reflux disease)    Hiatal hernia    History of cardiac catheterization    Normal coronary arteries 2016 and 2021   History of TIA (transient ischemic attack)    Hyperlipidemia    Hypertension    IBS (irritable bowel syndrome)    Migraine headache    Panic disorder with agoraphobia    Peripheral neuropathy    Subungual hematoma of  digit of hand    Tendonitis    Vertigo    Past Surgical History:  Procedure Laterality Date   ABDOMINAL HYSTERECTOMY     APPENDECTOMY     CARDIAC CATHETERIZATION   last in 2009   x 4, normal coronary arteries   CARDIAC CATHETERIZATION N/A 03/26/2015   Procedure: Left Heart Cath and Coronary Angiography;  Surgeon: Lisa Hampshire, MD;  Location: [facility];  Service: Cardiovascular;  Laterality: N/A;   CARPAL TUNNEL RELEASE Right 08/24/2018   Procedure: CARPAL TUNNEL RELEASE;  Surgeon: Lisa Civil, MD;  Location: [facility];  Service: Orthopedics;  Laterality: Right;   CESAREAN SECTION     CHOLECYSTECTOMY     COLONOSCOPY WITH PROPOFOL N/A 01/09/2018   Procedure: COLONOSCOPY WITH PROPOFOL;  Surgeon: Lisa Houston, MD;  Location: [facility];  Service: Endoscopy;  Laterality: N/A;   COLONOSCOPY WITH PROPOFOL N/A 09/21/2019   Procedure: COLONOSCOPY WITH PROPOFOL;  Surgeon: Lisa Houston, MD;  [facility]: [facility];  Service: Endoscopy;  Laterality: N/A;  1055   CYST EXCISION     right breast   ESOPHAGOGASTRODUODENOSCOPY (EGD) WITH PROPOFOL N/A 01/09/2018   Procedure: ESOPHAGOGASTRODUODENOSCOPY (EGD) WITH PROPOFOL;  Surgeon: Lisa Houston, MD;  [facility];  Service: Endoscopy;  Laterality: N/A;   LEFT HEART CATH AND CORONARY ANGIOGRAPHY N/A 02/25/2020   Procedure: LEFT HEART CATH AND CORONARY ANGIOGRAPHY;  Surgeon: Lisa Sine, MD;  Location: [facility];  Service: Cardiovascular;  Laterality: N/A;   POLYPECTOMY  09/21/2019   Procedure: POLYPECTOMY;  Surgeon: Lisa Houston, MD;  [facility];  Service: Endoscopy;;  ascending colon;   TOOTH EXTRACTION Bilateral 02/16/2018   Procedure: 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 RIGHT MAXILLARY SINUS ANTROSTOMY;  Surgeon: [name], DDS;  Location: [facility];  Service: Oral Surgery;  Laterality: Bilateral;   TUBAL LIGATION     History   Social History   Marital Status:  Divorced    Spouse Name: N/A   Number of Children: 1   Occupational History   disabled   Social History Main Topics   Smoking status: Current Every Day Smoker -- 0.50 packs/day for 11 years   Smokeless tobacco: Never Used     Comment: patient is aware that she needs to quit smoking   Alcohol Use: No   Drug Use: No   Current Outpatient Medications on File Prior to Visit  Medication Sig Dispense Refill   Continuous Blood Gluc Sensor (DEXCOM G6 SENSOR) MISC Use as instructed to check sugar 4 times daily change every 10 days 9 each 3   diazepam (VALIUM) 5 MG tablet Take 5 mg by mouth 3 (three) times  daily as needed for anxiety.      docusate sodium (COLACE) 100 MG capsule Take 100-200 mg by mouth See admin instructions. Take 100 mg by mouth in the morning and 200 mg in the evening     doxycycline (VIBRA-TABS) 100 MG tablet Take 1 tablet (100 mg total) by mouth 2 (two) times daily. 20 tablet 0   EPIPEN 2-PAK 0.3 MG/0.3ML SOAJ injection Inject 0.3 mg into the muscle as needed for anaphylaxis.      estradiol (ESTRACE) 1 MG tablet Take 1 mg by mouth daily.     furosemide (LASIX) 40 MG tablet Take 40 mg by mouth daily.     GLUCAGEN HYPOKIT 1 MG SOLR injection Inject 1 mg into the skin once as needed for low blood sugar.      Glucagon 3 MG/DOSE POWD Place 3 mg into the nose once as needed for up to 1 dose. (Patient taking differently: Place 3 mg into the nose once as needed (low blood). ) 1 each 11   insulin aspart (NOVOLOG) 100 UNIT/ML injection Inject up to 60 units in the pump daily 60 mL 3   Insulin Aspart FlexPen 100 UNIT/ML SOPN INJECT UP TO 70 UNITS A DAY. 15 mL 0   Insulin Human (INSULIN PUMP) SOLN Inject 1 each into the skin as directed. Humalog insulin     Iron-Vitamin C (VITRON-C) 65-125 MG TABS Take 1 tablet by mouth daily.     isosorbide mononitrate (IMDUR) 30 MG 24 hr tablet Take 1 tablet (30 mg total) by mouth daily. 90 tablet 1   LANTUS SOLOSTAR 100 UNIT/ML Solostar Pen INJECT 25  UNITS IN THE MORNING AND 10 UNITS AT NIGHT. 15 mL 1   LINZESS 145 MCG CAPS capsule TAKE 1 CAPSULE BY MOUTH ONCE DAILY. 30 capsule 0   lisinopril (PRINIVIL,ZESTRIL) 5 MG tablet Take 5 mg by mouth daily.  2   Melatonin 5 MG TABS Take 10 mg by mouth at bedtime as needed (sleep).      meloxicam (MOBIC) 15 MG tablet Take 1 tablet (15 mg total) by mouth daily. 30 tablet 1   nitroGLYCERIN (NITROSTAT) 0.4 MG SL tablet Place 1 tablet (0.4 mg total) under the tongue every 5 (five) minutes x 3 doses as needed for chest pain (if no relief after 3rd dose, proceed to the ED for an evaluation or call 911). 25 tablet 3   Omega-3 Fatty Acids (FISH OIL) 1200 MG CAPS Take 1,200 mg by mouth daily.      oxyCODONE-acetaminophen (PERCOCET/ROXICET) 5-325 MG tablet TAKE 1 TABLET EVERY 6 HOURS AS NEEDED FOR SEVERE PAIN. 30 tablet 0   polycarbophil (FIBERCON) 625 MG tablet Take 625 mg by mouth daily.     pravastatin (PRAVACHOL) 20 MG tablet TAKE 1 TABLET BY MOUTH AT BEDTIME. 90 tablet 1   pregabalin (LYRICA) 300 MG capsule Take 300 mg by mouth 2 (two) times daily.     promethazine (PHENERGAN) 25 MG tablet Take 1 tablet (25 mg total) by mouth every 6 (six) hours as needed for nausea or vomiting. 30 tablet 0   sennosides-docusate sodium (SENOKOT-S) 8.6-50 MG tablet Take 1 tablet by mouth daily as needed for constipation.     tiZANidine (ZANAFLEX) 4 MG tablet Take 4 mg by mouth every 8 (eight) hours as needed for muscle spasms.      No current facility-administered medications on file prior to visit.   Allergies  Allergen Reactions   Bee Venom Anaphylaxis and Hives   Zocor [  Simvastatin] Other (See Comments)    Muscle aches and pains   Clindamycin/Lincomycin Rash   Codeine Rash   Penicillins Rash    Did it involve swelling of the face/tongue/throat, SOB, or low BP? No Did it involve sudden or severe rash/hives, skin peeling, or any reaction on the inside of your mouth or nose? Yes Did you need to seek medical attention  at a hospital or doctor's office? Yes When did it last happen?      30 years If all above answers are "NO", may proceed with cephalosporin use.    Sulfa Antibiotics Rash        Family History  Problem Relation Age of Onset   Stroke Mother 63   Heart attack Mother 8   Cancer Sister        colon   Heart failure Maternal Grandmother 49   Cancer Maternal Grandfather        prostate   Heart failure Paternal Grandmother 62   Heart failure Paternal Grandfather 36   PE: BP 110/70 (BP Location: Right Arm, Patient Position: Sitting, Cuff Size: Normal)   Pulse 73   Ht 5\' 7"  (1.702 m)   Wt 176 lb 12.8 oz (80.2 kg)   SpO2 98%   BMI 27.69 kg/m  Body mass index is 27.69 kg/m. Wt Readings from Last 3 Encounters:  02/16/21 176 lb 12.8 oz (80.2 kg)  10/17/20 174 lb 3.2 oz (79 kg)  04/04/20 167 lb (75.8 kg)   Constitutional: normal weight, in NAD Eyes: PERRLA, EOMI, no exophthalmos ENT: moist mucous membranes, no thyromegaly, no cervical lymphadenopathy Cardiovascular: RRR, No MRG Respiratory: CTA B Gastrointestinal: abdomen soft, NT, ND, BS+ Musculoskeletal: no deformities, strength intact in all 4 Skin: moist, warm, no rashes Neurological: no tremor with outstretched hands, DTR normal in all 4  ASSESSMENT: 1. DM1, uncontrolled, with complications - cerebro-vascular ds - h/o TIA 0/2016 - PN - DR Sees cardiology >> Dr. Maurice Small - [facility]  2. HL  3.  High B12  PLAN:  1. Patient with longstanding, uncontrolled, type 1 diabetes, previously on an insulin pump but off after an episode of DKA in 09/2018 and suspicion of stroke.  This was ruled out by MRI and she also had normal carotid Dopplers afterwards.  She then restarted insulin pump: T:slim connected with Dexcom G6 CGM.  Sugars improved significantly after starting back on the pump but at last visit, she was off both sensor and the pump for 8 days after she ran out of supplies. I advised him to call [facility] again and  asked them to send her pump and CGM supplies as soon as possible. Reviewing the available CGM data, it appears that sugars were in target interval in the second half of the night and in the afternoon but they were increasing significantly in the first half of the night amount of breakfast.  Sugars were increasing after meals and she did not appear to be bolusing almost at all before meals.  She had consecutive days for which she did not enter any carbs or did any mealtime boluses.  She relied almost exclusively on automatic boluses.  I advised her that this is not conducive to good control and strongly advised her to try to enter the carbs and bolus based on the pump recommendation.  We also discussed about using the exercise mode of the pump whenever active as she was complaining of sugar occasionally dropping after exercise. CGM interpretation: -At today's visit, we reviewed her  CGM downloads: It appears that 46% of values are in target range (goal >70%), while 54% are higher than 180 (goal <25%), and 0% are lower than 70 (goal <4%).  The calculated average blood sugar is 195.  -Reviewing the CGM trends, it appears that her sugars are in the elevated throughout the night and they increase after every meal afterwards with a more significant increase after dinner.  Upon questioning and review of her pump downloads, she is not bolusing consistently.  She is doing sparse boluses, 0-1x a day, but she otherwise let's her pump to provide automatic microboluses to control her blood sugars.  We again discussed that this does not provide good control and she absolutely needs to start bolusing before every meal, 15 minutes before the meal.  We also discussed  to start the boluses immediately before the meal, when sugars are low, but otherwise, to always wait 15 minutes before she eats. -At this visit, I also noticed that she did not enter the pump setting changes that I suggested at last visit.  At last visit, the same  thing happened, she did not answer the previously recommended changes.  We discussed that I can show you how to enter the changes if needed or she can call the telephone number on the back of her pump.  At today's visit, however, I would not suggest any pump setting changes pending her taking her boluses consistently. - I suggested to:  Patient Instructions  Please use the following pump settings: - basal rates: 12 am: 0.6 2 pm: 0.6 2 pm : 0.7 - ICR:              12 am: 1:14 - target: 100-100 - ISF: 50 - Active insulin time: 3 hours  Please do the following approximately 15 minutes before every meal: - Enter carbs (C) - Enter sugars (S) - Start insulin bolus (I)  Please return in 3-4 months.   - we checked her HbA1c: 9.5% (higher) - advised to check sugars at different times of the day - 4x a day, rotating check times - advised for yearly eye exams >> she is UTD - We will check annual labs at next visit - return to clinic in 3-4 months  2. HL -Reviewed latest lipid panel from 04/2020: Fractions at goal Lab Results  Component Value Date   CHOL 144 04/04/2020   HDL 44.20 04/04/2020   LDLCALC 82 04/04/2020   TRIG 87.0 04/04/2020   CHOLHDL 3 04/04/2020  -Continues pravastatin 20 mg daily without side effects - will check a lipid panel at next visit  3.  High B12 -She had a high B12 level before but she stopped supplements since then. -At last visit, B12 level was normal, and [place], MD PhD [facility]

## 2021-02-17 ENCOUNTER — Other Ambulatory Visit (HOSPITAL_COMMUNITY): Payer: Self-pay | Admitting: Neurology

## 2021-02-17 DIAGNOSIS — M545 Low back pain, unspecified: Secondary | ICD-10-CM

## 2021-02-17 DIAGNOSIS — M25551 Pain in right hip: Secondary | ICD-10-CM

## 2021-02-24 ENCOUNTER — Ambulatory Visit: Payer: Medicare Other | Admitting: Nutrition

## 2021-03-02 ENCOUNTER — Ambulatory Visit: Payer: Medicare Other | Admitting: Nutrition

## 2021-03-03 ENCOUNTER — Other Ambulatory Visit: Payer: Self-pay | Admitting: Internal Medicine

## 2021-03-11 ENCOUNTER — Telehealth: Payer: Self-pay | Admitting: Internal Medicine

## 2021-03-11 DIAGNOSIS — E1065 Type 1 diabetes mellitus with hyperglycemia: Secondary | ICD-10-CM

## 2021-03-11 DIAGNOSIS — E1042 Type 1 diabetes mellitus with diabetic polyneuropathy: Secondary | ICD-10-CM

## 2021-03-11 MED ORDER — LANTUS SOLOSTAR 100 UNIT/ML ~~LOC~~ SOPN: PEN_INJECTOR | SUBCUTANEOUS | 0 refills | Status: DC

## 2021-03-11 NOTE — Telephone Encounter (Signed)
Patient reports that her pump was not exposed to xray machine, but when she tried to restart the pump after the xray, it would not restart.  She was told to call the 800 help line, but she wanted a script called in, in case they need to send her a new pump.  She takes 25u AM and 10u PM of Lantus solostar pens.  She says she has plenty of pen needles and does not need any at this time. Please call this in to [facility] pharmacy, and she would like a 3 month supply

## 2021-03-11 NOTE — Telephone Encounter (Signed)
Rx sent to preferred pharmacy.

## 2021-03-11 NOTE — Telephone Encounter (Signed)
Pt had a MRI this morning and pump is locked up. Patient is stating that she needs fast acting lantus LANTUS SOLOSTAR 100 UNIT/ML Solostar Pen as soon as possible  Until she can get an app with [place], [place]

## 2021-03-27 ENCOUNTER — Telehealth: Payer: Self-pay

## 2021-03-27 NOTE — Telephone Encounter (Signed)
Inbound request for documentation and form be completed and faxed to [phone].

## 2021-04-15 ENCOUNTER — Other Ambulatory Visit: Payer: Self-pay

## 2021-04-15 ENCOUNTER — Ambulatory Visit (INDEPENDENT_AMBULATORY_CARE_PROVIDER_SITE_OTHER): Payer: Medicare Other

## 2021-04-15 ENCOUNTER — Ambulatory Visit (INDEPENDENT_AMBULATORY_CARE_PROVIDER_SITE_OTHER): Payer: Medicare Other | Admitting: Podiatry

## 2021-04-15 DIAGNOSIS — S92504A Nondisplaced unspecified fracture of right lesser toe(s), initial encounter for closed fracture: Secondary | ICD-10-CM | POA: Diagnosis not present

## 2021-04-15 NOTE — Progress Notes (Signed)
   HPI: 50 y.o. female presenting today for new complaint regarding pain and tenderness to the right second toe.  Patient states that a few weeks ago she sustained a slip and fall injury in the shower.  She hit her right second toe and noticed significant pain with bruising.  She is concerned for toe fracture.  She presents for further treatment and evaluation  Past Medical History:  Diagnosis Date   Benign paroxysmal vertigo    Cervicalgia    Depression    Diabetes mellitus ([place])    GERD (gastroesophageal reflux disease)    Hiatal hernia    History of cardiac catheterization    Normal coronary arteries 2016 and 2021   History of TIA (transient ischemic attack)    Hyperlipidemia    Hypertension    IBS (irritable bowel syndrome)    Migraine headache    Panic disorder with agoraphobia    Peripheral neuropathy    Subungual hematoma of digit of hand    Tendonitis    Vertigo      Physical Exam: General: The patient is alert and oriented x3 in no acute distress.  Dermatology: Skin is warm, dry and supple bilateral lower extremities. Negative for open lesions or macerations.  Vascular: Palpable pedal pulses bilaterally. No edema or erythema noted. Capillary refill within normal limits.  Neurological: Epicritic and protective threshold grossly intact bilaterally.   Musculoskeletal Exam: Range of motion within normal limits to all pedal and ankle joints bilateral. Muscle strength 5/5 in all groups bilateral.  Tenderness to palpation to the right second digit.  Overall there is good alignment of the toes  Radiographic Exam:  Normal osseous mineralization.  Absence of the second metatarsal head noted secondary to the surgery on 11/20/2020.  Transverse fracture of the head of the proximal phalanx of the second toe noted with minimal displacement  Assessment: 1.  Fracture right second toe, closed, nondisplaced, initial encounter   Plan of Care:  1. Patient evaluated. X-Rays reviewed.   2.  Explained to the patient that she sustained a right second toe fracture.  This should heal uneventfully.  Postsurgical shoe dispensed.  Weightbearing as tolerated x6 weeks 3.  Return to clinic in 6 weeks for follow-up x-ray      Edrick Kins, DPM [facility]  Dr. Edrick Kins, DPM    [address], [place] [address]                Office [phone]  Fax [phone]

## 2021-04-24 LAB — HM DIABETES EYE EXAM

## 2021-05-04 ENCOUNTER — Other Ambulatory Visit: Payer: Self-pay | Admitting: Internal Medicine

## 2021-05-25 ENCOUNTER — Other Ambulatory Visit: Payer: Self-pay

## 2021-05-25 ENCOUNTER — Ambulatory Visit (INDEPENDENT_AMBULATORY_CARE_PROVIDER_SITE_OTHER): Payer: Medicare Other

## 2021-05-25 ENCOUNTER — Ambulatory Visit (INDEPENDENT_AMBULATORY_CARE_PROVIDER_SITE_OTHER): Payer: Medicare Other | Admitting: Podiatry

## 2021-05-25 DIAGNOSIS — S92504A Nondisplaced unspecified fracture of right lesser toe(s), initial encounter for closed fracture: Secondary | ICD-10-CM | POA: Diagnosis not present

## 2021-05-25 MED ORDER — MELOXICAM 15 MG PO TABS: 15.0000 mg | ORAL_TABLET | Freq: Every day | ORAL | 1 refills | Status: DC

## 2021-05-25 NOTE — Progress Notes (Signed)
   HPI: 50 y.o. female presenting today for follow-up evaluation of a fracture that was sustained to the patient's right second toe.  Patient also states that the entire forefoot is very painful.  She has been wearing good supportive sneakers and continues to experience pain associated to the forefoot.  Past Medical History:  Diagnosis Date   Benign paroxysmal vertigo    Cervicalgia    Depression    Diabetes mellitus (HCC)    GERD (gastroesophageal reflux disease)    Hiatal hernia    History of cardiac catheterization    Normal coronary arteries 2016 and 2021   History of TIA (transient ischemic attack)    Hyperlipidemia    Hypertension    IBS (irritable bowel syndrome)    Migraine headache    Panic disorder with agoraphobia    Peripheral neuropathy    Subungual hematoma of digit of hand    Tendonitis    Vertigo      Physical Exam: General: The patient is alert and oriented x3 in no acute distress.  Dermatology: Skin is warm, dry and supple bilateral lower extremities. Negative for open lesions or macerations.  Vascular: Palpable pedal pulses bilaterally.  Minimal edema noted to the foot capillary refill within normal limits.  Neurological: Epicritic and protective threshold grossly intact bilaterally.   Musculoskeletal Exam: Diffuse pain on palpation throughout the entire forefoot consistent with the metatarsalgia  Radiographic Exam:  Normal osseous mineralization.  Absence of the second metatarsal head noted secondary to the surgery on 11/20/2020.  Transverse fracture of the head of the proximal phalanx of the second toe noted with minimal displacement and callus formation which demonstrates routine healing  Assessment: 1.  Fracture right second toe, closed, nondisplaced, with routine healing 2.  Metatarsalgia right forefoot   Plan of Care:  1. Patient evaluated. X-Rays reviewed.  There is good demonstration of healing of the fracture fragment of the proximal phalanx  head 2.  Metatarsal pads were applied to the insoles of the shoes of the right foot to offload pressure from the forefoot 3.  Prescription for meloxicam 15 mg daily 4.  Return to clinic as needed     Edrick Kins, DPM [facility]  Dr. Edrick Kins, DPM    [address], [place] [address]                Office [phone]  Fax [phone]

## 2021-05-27 ENCOUNTER — Ambulatory Visit: Payer: Medicare Other | Admitting: Podiatry

## 2021-06-03 ENCOUNTER — Telehealth: Payer: Self-pay | Admitting: Internal Medicine

## 2021-06-03 DIAGNOSIS — E1065 Type 1 diabetes mellitus with hyperglycemia: Secondary | ICD-10-CM

## 2021-06-03 DIAGNOSIS — E1042 Type 1 diabetes mellitus with diabetic polyneuropathy: Secondary | ICD-10-CM

## 2021-06-03 NOTE — Telephone Encounter (Signed)
Pt not having success with [facility] for her diabetic supplies and non-accurate readings with Dexcom. Pt would like to switch to [employer] and [facility] for her supplies and diabetic care. Pt reaching out to [facility] for assistance also.  Pt contact [phone]

## 2021-06-04 NOTE — Telephone Encounter (Signed)
Instructed by Leonia Reader, pt requesting Omnipod 5 with Dexcom sensors.   Pt wants to use [facility] for supplies.  Pt contact [phone]

## 2021-06-04 NOTE — Telephone Encounter (Signed)
Pt advised to call [place] at [phone] to place DME supply order and any paperwork needed will be faxed to the office to be completed.

## 2021-06-05 ENCOUNTER — Ambulatory Visit: Payer: Medicare Other | Admitting: Internal Medicine

## 2021-06-17 ENCOUNTER — Telehealth: Payer: Self-pay

## 2021-06-17 ENCOUNTER — Telehealth: Payer: Self-pay | Admitting: Nutrition

## 2021-06-17 DIAGNOSIS — E1042 Type 1 diabetes mellitus with diabetic polyneuropathy: Secondary | ICD-10-CM

## 2021-06-17 DIAGNOSIS — E1065 Type 1 diabetes mellitus with hyperglycemia: Secondary | ICD-10-CM

## 2021-06-17 NOTE — Telephone Encounter (Signed)
Message left on my machine to please call in Dexcom with sensors  and transmitter,for her OmniPod 5 pump.  This can be sent to her to her local pharmacy or to her mail order pharmacy.  Thank you

## 2021-06-17 NOTE — Telephone Encounter (Signed)
Clinical notes routed to [facility] via Epic to [phone]

## 2021-06-18 ENCOUNTER — Telehealth: Payer: Self-pay

## 2021-06-18 ENCOUNTER — Encounter: Payer: Self-pay | Admitting: *Deleted

## 2021-06-18 NOTE — Telephone Encounter (Signed)
NOTES SCANNED TO REFERRAL 

## 2021-06-21 NOTE — Progress Notes (Deleted)
Cardiology Office Note  Date: 06/21/2021   ID: [name], DOB 1970-10-07, MRN [id number]  PCP:  Denny Levy, [place]  Cardiologist:  Rozann Lesches, MD Electrophysiologist:  None   Chief Complaint: Hypertension, chest pain, DM 1, needs echo, EKG per PCP  History of Present Illness: [name] is a 50 y.o. female with a history of  HTN, HLD, DM I (poorly controlled), TIA, migraine, tobacco abuse, peripheral neuropathy,  Recent ER visit  11/16/2019 for c/o of intermittent chest pain, SOB, bilateral leg edema, and fatigue. EKG was negative for acute abnormalities, CXR no acute changes. BNP was negative, troponins were negative.  Last saw Dr. Domenic Polite on 05/03/2018 for complaints of chest pain during an ER visit.  At that time she complained of left-sided chest discomfort with some radiation into the left shoulder and upper arm not precipitated by activity.  Also had some relative fluctuations in blood pressure and heart rate at home.  She had a previous consultation with Dr. Harl Bowie back in 2016 related to chest pain.  She had a subsequent cardiac catheterization at that time which revealed normal coronary arteries. She is Last visit with me on 12/20/2019 with complaints of palpitations.  Continued to complain of some swelling in her lower extremities.  Feeling dizzy at times tired with some chest discomfort.  Complaining of some left leg pain.  She stated  she had some palpitations.  She had an incident where she states she felt like her heart was going to beat out of her chest and she had chest pain and associated dizziness.  She denied any CVA or TIA-like symptoms, PND/orthopnea.  Claudication-like symptoms, DVT or PE-like symptoms.  She  does have significant issues with diabetes and a long history since age 60.  Recently saw a dietitian to help with better control of diabetes  Last visit she was ordered a Zio patch monitor.  She called on 02/11/2020 and spoke to Mrs. Ashworth CMA  stating she had been unable to wear the heart monitor since she had other health problems wanted to know if she could go ahead and schedule cardiac catheterization.  She stated she was having chest pain almost daily lasting 5 to 10 minutes several times daily.  Complaining of swelling in feet and ankles and gained approximately 15 pounds in last month.  Patient was made aware of chest pain/symptoms escalate she would need to go to the emergency room for evaluation.  Continues to complain of chest pain that is intermittent.  Associated with and without exertion.  States it sometimes radiates to her left arm.  Describes the pain as a squeezing-like pain.  Continues to complain of significant fatigue.  She continues to smoke in spite of multiple attempts to encourage cessation.  She denies any associated nausea, vomiting, diaphoresis.  As stated above she states she has continuing chest pain lasting 5 to 10 minutes several times a day.  She states she recently started back on her insulin pump which is brought her blood sugars down.  States she has left leg pain and has had multiple work-ups showing no significant issues.  She states she believes this is from neuropathy from her diabetes.  She is on Lyrica for neuropathic pain/neuropathy.  She states she is having significant issues with IBS, specifically constipation.  She is on Linzess for constipation which she states initially worked but seems to be working less and less.  States on quite a few occasions she does not have a  bowel movement for several days.  States she has tried multiple laxatives, stool softeners, etc. which never seem to work that well.   Past Medical History:  Diagnosis Date   Benign paroxysmal vertigo    Cervicalgia    Depression    Diabetes mellitus (HCC)    GERD (gastroesophageal reflux disease)    Hiatal hernia    History of cardiac catheterization    Normal coronary arteries 2016 and 2021   History of TIA (transient ischemic  attack)    Hyperlipidemia    Hypertension    IBS (irritable bowel syndrome)    Migraine headache    Panic disorder with agoraphobia    Peripheral neuropathy    Subungual hematoma of digit of hand    Tendonitis    Vertigo     Past Surgical History:  Procedure Laterality Date   ABDOMINAL HYSTERECTOMY     APPENDECTOMY     CARDIAC CATHETERIZATION   last in 2009   x 4, normal coronary arteries   CARDIAC CATHETERIZATION N/A 03/26/2015   Procedure: Left Heart Cath and Coronary Angiography;  Surgeon: Wellington Hampshire, MD;  Location: [place] [facility];  Service: Cardiovascular;  Laterality: N/A;   CARPAL TUNNEL RELEASE Right 08/24/2018   Procedure: CARPAL TUNNEL RELEASE;  Surgeon: Carole Civil, MD;  Location: [facility];  Service: Orthopedics;  Laterality: Right;   CESAREAN SECTION     CHOLECYSTECTOMY     COLONOSCOPY WITH PROPOFOL N/A 01/09/2018   Procedure: COLONOSCOPY WITH PROPOFOL;  Surgeon: Rogene Houston, MD;  Location: [facility];  Service: Endoscopy;  Laterality: N/A;   COLONOSCOPY WITH PROPOFOL N/A 09/21/2019   Procedure: COLONOSCOPY WITH PROPOFOL;  Surgeon: Rogene Houston, MD;  [facility]: [facility];  Service: Endoscopy;  Laterality: N/A;  1055   CYST EXCISION     right breast   ESOPHAGOGASTRODUODENOSCOPY (EGD) WITH PROPOFOL N/A 01/09/2018   Procedure: ESOPHAGOGASTRODUODENOSCOPY (EGD) WITH PROPOFOL;  Surgeon: Rogene Houston, MD;  [facility];  Service: Endoscopy;  Laterality: N/A;   LEFT HEART CATH AND CORONARY ANGIOGRAPHY N/A 02/25/2020   Procedure: LEFT HEART CATH AND CORONARY ANGIOGRAPHY;  Surgeon: Troy Sine, MD;  Location: [place] [facility];  Service: Cardiovascular;  Laterality: N/A;   POLYPECTOMY  09/21/2019   Procedure: POLYPECTOMY;  Surgeon: Rogene Houston, MD;  [facility];  Service: Endoscopy;;  ascending colon;   TOOTH EXTRACTION Bilateral 02/16/2018   Procedure: 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      docusate sodium (COLACE) 100 MG capsule Take 100-200 mg by mouth See admin instructions. Take 100 mg by mouth in the morning and 200 mg in the evening     doxycycline (VIBRA-TABS) 100 MG tablet Take 1 tablet (100 mg total) by mouth 2 (two) times daily. 20 tablet 0   EPIPEN 2-PAK 0.3 MG/0.3ML SOAJ injection Inject 0.3 mg into the muscle as needed for anaphylaxis.      estradiol (ESTRACE) 1 MG tablet  Take 1 mg by mouth daily.     furosemide (LASIX) 40 MG tablet Take 40 mg by mouth daily.     Glucagon 3 MG/DOSE POWD Place 3 mg into the nose once as needed for up to 1 dose. 1 each 11   insulin aspart (NOVOLOG) 100 UNIT/ML injection Inject up to 60 units in the pump daily 60 mL 3   Insulin Aspart FlexPen (NOVOLOG) 100 UNIT/ML INJECT UP TO 70 UNITS A DAY. 15 mL 0   Insulin Human (INSULIN PUMP) SOLN Inject 1 each into the skin as directed. Humalog insulin     Iron-Vitamin C (VITRON-C) 65-125 MG TABS Take 1 tablet by mouth daily.     isosorbide mononitrate (IMDUR) 30 MG 24 hr tablet Take 1 tablet (30 mg total) by mouth daily. 90 tablet 1   LANTUS SOLOSTAR 100 UNIT/ML Solostar Pen INJECT 25 UNITS IN THE MORNING AND 10 UNITS AT NIGHT. 15 mL 0   LINZESS 145 MCG CAPS capsule TAKE 1 CAPSULE BY MOUTH ONCE DAILY. 30 capsule 0   lisinopril (PRINIVIL,ZESTRIL) 5 MG tablet Take 5 mg by mouth daily.  2   Melatonin 5 MG TABS Take 10 mg by mouth at bedtime as needed (sleep).      meloxicam (MOBIC) 15 MG tablet Take 1 tablet (15 mg total) by mouth daily. 30 tablet 1   nitroGLYCERIN  (NITROSTAT) 0.4 MG SL tablet Place 1 tablet (0.4 mg total) under the tongue every 5 (five) minutes x 3 doses as needed for chest pain (if no relief after 3rd dose, proceed to the ED for an evaluation or call 911). 25 tablet 3   Omega-3 Fatty Acids (FISH OIL) 1200 MG CAPS Take 1,200 mg by mouth daily.      oxyCODONE-acetaminophen (PERCOCET/ROXICET) 5-325 MG tablet TAKE 1 TABLET EVERY 6 HOURS AS NEEDED FOR SEVERE PAIN. 30 tablet 0   polycarbophil (FIBERCON) 625 MG tablet Take 625 mg by mouth daily.     pravastatin (PRAVACHOL) 20 MG tablet TAKE 1 TABLET BY MOUTH AT BEDTIME. 90 tablet 1   pregabalin (LYRICA) 300 MG capsule Take 300 mg by mouth 2 (two) times daily.     promethazine (PHENERGAN) 25 MG tablet Take 1 tablet (25 mg total) by mouth every 6 (six) hours as needed for nausea or vomiting. 30 tablet 0   sennosides-docusate sodium (SENOKOT-S) 8.6-50 MG tablet Take 1 tablet by mouth daily as needed for constipation.     tiZANidine (ZANAFLEX) 4 MG tablet Take 4 mg by mouth every 8 (eight) hours as needed for muscle spasms.      No current facility-administered medications for this visit.   Allergies:  Bee venom, Zocor [simvastatin], Clindamycin/lincomycin, Codeine, Penicillins, and Sulfa antibiotics   Social History: The patient  reports that she has been smoking cigarettes. She has a 5.50 pack-year smoking history. She has never used smokeless tobacco. She reports current alcohol use. She reports that she does not use drugs.   Family History: The patient's family history includes Cancer in her maternal grandfather and sister; Heart attack (age of onset: 58) in her mother; Heart failure (age of onset: 62) in her paternal grandfather; Heart failure (age of onset: 54) in her maternal grandmother; Heart failure (age of onset: 100) in her paternal grandmother; Stroke (age of onset: 69) in her mother.   ROS:  Please see the history of present illness. Otherwise, complete review of systems is positive for  none.  All other systems are reviewed and negative.  Physical Exam: VS:  There were no vitals taken for this visit., BMI There is no height or weight on file to calculate BMI.  Wt Readings from Last 3 Encounters:  02/16/21 176 lb 12.8 oz (80.2 kg)  10/17/20 174 lb 3.2 oz (79 kg)  04/04/20 167 lb (75.8 kg)    General: Patient appears comfortable at rest. Neck: Supple, no elevated JVP or carotid bruits, no thyromegaly. Lungs: Clear to auscultation, nonlabored breathing at rest. Cardiac: Regular rate and rhythm, no S3 or significant systolic murmur, no pericardial rub. Extremities: No pitting edema, distal pulses 2+. Skin: Warm and dry. Musculoskeletal: No kyphosis. Neuropsychiatric: Alert and oriented x3, affect grossly appropriate.  ECG:   Recent EKG on 11/16/2019 at the emergency department showed normal sinus rhythm rate of 80, right atrial enlargement, rightward access.  Recent Labwork: 10/17/2020: TSH 4.26     Component Value Date/Time   CHOL 144 04/04/2020 1504   TRIG 87.0 04/04/2020 1504   HDL 44.20 04/04/2020 1504   CHOLHDL 3 04/04/2020 1504   VLDL 17.4 04/04/2020 1504   LDLCALC 82 04/04/2020 1504    Other Studies Reviewed Today:   Cardiac catheterization 02/05/2020  The left ventricular systolic function is normal. LV end diastolic pressure is normal.   Normal epicardial coronary arteries in a dominant left coronary system.   Hyperdynamic LV function with EF at 65%; LVEDP 17 mmHg.   RECOMMENDATION: The patient's chest pain most likely is noncardiac in etiology.  Optimal blood pressure control with ideal blood pressure less than 120/80 aggressive lipid-lowering therapy in this diabetic female with target LDL less than 70.  Smoking cessation is essential.    Diagnostic Dominance: Left     Nuclear stress test 11/29/2019 There was no ST segment deviation noted during stress. The study is normal. There are no perfusion defects This is a low risk study. The  left ventricular ejection fraction is normal (55-65%).   Echocardiogram 11/29/2019 1. Left ventricular ejection fraction, by estimation, is 60 to 65%. The left ventricle has normal function. The left ventricle has no regional wall motion abnormalities. Left ventricular diastolic parameters were normal. 2. Right ventricular systolic function is normal. The right ventricular size is normal. 3. The mitral valve is normal in structure. No evidence of mitral valve regurgitation. No evidence of mitral stenosis. 4. The aortic valve is tricuspid. Aortic valve regurgitation is not visualized. No aortic stenosis is present. 5. The inferior vena cava is normal in size with greater than 50% respiratory variability, suggesting right atrial pressure of 3 mmHg.    Cardiac catheterization 03/26/2015: 1. Normal coronary arteries in a left dominant system.  2. Normal LV systolic function and left ventricular end-diastolic pressure.   Recommendations: The chest pain does not seem to be cardiac. Continue aggressive treatment of risk factors.   Echocardiogram 08/18/2014: Study Conclusions  - Left ventricle: The cavity size was normal. Systolic function was   normal. The estimated ejection fraction was in the range of 55%   to 60%. Wall motion was normal; there were no regional wall   motion abnormalities. - Aortic valve: Valve area (Vmax): 1.98 cm^2.  Impressions:  - No cardiac source of emboli was indentified.  Lower Extremity Venous Duplex for LLE pain IMPRESSION: No femoropopliteal DVT nor evidence of DVT within the visualized calf veins.   If clinical symptoms are inconsistent or if there are persistent or worsening symptoms, further imaging (possibly involving the iliac veins) may be warranted.     Assessment and Plan:  1. Atypical chest pain Continues with chest pain.  I talked to her about her request regarding wanting to have a repeat cardiac catheterization.  I mentioned we will  have to discuss the case with Dr. Domenic Polite to see what his decision is going forward regarding a repeat catheterization.  At last visit she was having palpitations. She had an episode where she felt like her heart was beating out of her chest and she had associated chest pain and some dizziness.  Her stress test was normal and had no significant perfusion defects.  No ST segment deviation on EKG.  Her EF was 55 to 65% on stress.  She was ordered a ZIO patch monitor.  Stated she was unable to wear it due to other medical issues.  She has not voiced any further issues with palpitations today during visit.  Start Imdur 30 mg daily.  We will discuss case with Dr. Domenic Polite to determine need for cardiac catheterization.  2. Mixed hyperlipidemia Recent lipid panel Dec 26, 2018 showed TC 146, TG 86, HDL 44, LDL 94.  Continue Pravachol 10 mg daily.  3. Type 2 diabetes mellitus without complication, with long-term current use of insulin (HCC) Patient's had a long history of diabetes since age 25.  She has previously been on insulin pumps in the past.  Managed by PCP.  Patient states she recently restarted her insulin pump and blood sugars have been better.  4.  Dyspnea Patient  had been having increased dyspnea on exertion along with associated chest pain when performing exertional activity.  Also complained of recent lower extremity edema.  Her recent echocardiogram was normal.  5.  Hypertension. Blood pressures well controlled on current medication.  Blood pressure today 104/64.  Continue lisinopril 5 mg p.o. daily  6.  Smoking Patient has 17 years history of smoking.  Highly advised patient to stop smoking given her other risk factors for heart disease.  Patient states she knows she  needs to quit.  7.  Palpitations She was ordered a Zio patch monitor at last visit.  States she was unable to wear due to other multiple medical issues.  She denies any palpitations on visit today.   8.  Complaints of lower  extremity edema and weight gain. No noticeable lower extremity edema noted on exam today.  States she has gained 15 pounds over the last month or so.  In looking at her previous weights she weighed 157 pounds on Dec 04, 2019.  Today's weight is 166.  An approximately 9 pound weight gain since May  Medication Adjustments/Labs and Tests Ordered: Current medicines are reviewed at length with the patient today.  Concerns regarding medicines are outlined above.   Disposition: Follow-up with Dr. Domenic Polite or APP 2 months Signed, Levell July, NP 06/21/2021 6:20 PM    [facility] at [address], [place], [place] [address] Phone: [phone]; Fax: [phone]

## 2021-06-22 ENCOUNTER — Ambulatory Visit: Payer: Medicare Other | Admitting: Family Medicine

## 2021-06-22 DIAGNOSIS — R0600 Dyspnea, unspecified: Secondary | ICD-10-CM

## 2021-06-22 DIAGNOSIS — R0789 Other chest pain: Secondary | ICD-10-CM

## 2021-06-22 DIAGNOSIS — E118 Type 2 diabetes mellitus with unspecified complications: Secondary | ICD-10-CM

## 2021-06-22 DIAGNOSIS — R002 Palpitations: Secondary | ICD-10-CM

## 2021-06-22 DIAGNOSIS — R600 Localized edema: Secondary | ICD-10-CM

## 2021-06-22 DIAGNOSIS — I10 Essential (primary) hypertension: Secondary | ICD-10-CM

## 2021-06-22 DIAGNOSIS — Z720 Tobacco use: Secondary | ICD-10-CM

## 2021-06-22 DIAGNOSIS — E782 Mixed hyperlipidemia: Secondary | ICD-10-CM

## 2021-06-23 NOTE — Telephone Encounter (Signed)
This is a different pump.  She is wanting an OmniPod 5 pump.  This must be sent to her pharmacy as a prescription.  It must say OmniPod 5 starter kit with OmniPod 5 pods- 1 q 3 days.  She will continue to be on the Dexcom sensors from [employer]

## 2021-06-24 MED ORDER — OMNIPOD 5 G6 INTRO (GEN 5) KIT
1.0000 | PACK | Freq: Once | 0 refills | Status: AC
Start: 2021-06-24 — End: 2021-06-24

## 2021-06-24 MED ORDER — OMNIPOD 5 DEXG7G6 PODS GEN 5 MISC: 1.0000 | 3 refills | Status: DC

## 2021-06-24 MED ORDER — DEXCOM G6 SENSOR MISC: 3 refills | Status: DC

## 2021-06-24 MED ORDER — DEXCOM G6 TRANSMITTER MISC: 3 refills | Status: DC

## 2021-06-24 NOTE — Telephone Encounter (Signed)
Rx sent to [facility].

## 2021-06-24 NOTE — Telephone Encounter (Signed)
Rx sent to preferred pharmacy.

## 2021-06-24 NOTE — Addendum Note (Signed)
Addended by: Lauralyn Primes on: 06/24/2021 10:33 AM   Modules accepted: Orders

## 2021-06-29 ENCOUNTER — Telehealth: Payer: Self-pay | Admitting: Nutrition

## 2021-06-29 NOTE — Telephone Encounter (Signed)
Message left on my machine that she needed to see me.  Left her a message with my number to call me back to schedule the appointment

## 2021-06-30 ENCOUNTER — Other Ambulatory Visit: Payer: Self-pay

## 2021-06-30 ENCOUNTER — Telehealth: Payer: Self-pay | Admitting: Nutrition

## 2021-06-30 ENCOUNTER — Encounter: Payer: Medicare Other | Admitting: Nutrition

## 2021-06-30 NOTE — Telephone Encounter (Signed)
Patient here to start on her OmniPod 5 pump.  Her phone would not support the G6 App, and she did not want to test blood sugars.  She is going home to get an Iphone or new android that is version 9 or above and return tomorrow to start this pump

## 2021-07-01 ENCOUNTER — Encounter: Payer: Medicare Other | Attending: Pediatric Endocrinology | Admitting: Nutrition

## 2021-07-01 DIAGNOSIS — E1042 Type 1 diabetes mellitus with diabetic polyneuropathy: Secondary | ICD-10-CM | POA: Insufficient documentation

## 2021-07-01 DIAGNOSIS — E1065 Type 1 diabetes mellitus with hyperglycemia: Secondary | ICD-10-CM | POA: Insufficient documentation

## 2021-07-01 NOTE — Progress Notes (Signed)
Patient was trained on the use of the OmniPod 5 and Dexcom sensors.  These were linked together, and to [place] [facility].  She filled a pod with Novolog insulin and attached it to her upper left arm.  She tested her blood sugar and it was 369.  She had not taken Lantus insulin for 2 days.  She did a correction dose with little assistance from me.We covered all topics on the checklist and she signed this indicating understanding all topics with no final questions. Settings were put in per Dr. Chrissie Noa orders:  Basal rate: 0.6, I/C: 14, target: 110 with corrections over 110, timing: 4 hours---her last pump settings. She was given information on carb counting, and we reviewed this.  She reported good understanding of this. Patient was told to put the pump in automated mode when her Dexcom begins to work after the 2 hour warm up period.  She redemonstrated how to do this correctly X2 and had no final questions.

## 2021-07-01 NOTE — Patient Instructions (Signed)
Read over manual Call 800 help line if questions

## 2021-07-08 ENCOUNTER — Other Ambulatory Visit: Payer: Self-pay | Admitting: Internal Medicine

## 2021-07-09 ENCOUNTER — Ambulatory Visit: Payer: Medicare Other | Admitting: Internal Medicine

## 2021-07-14 ENCOUNTER — Encounter: Payer: Medicare Other | Admitting: Nutrition

## 2021-07-15 ENCOUNTER — Telehealth: Payer: Self-pay | Admitting: Nutrition

## 2021-07-15 NOTE — Telephone Encounter (Signed)
Patient left voice message that she had to cancel her appointment to finish her pump training,  Will call back to reschedule when she feels better

## 2021-07-22 ENCOUNTER — Encounter (INDEPENDENT_AMBULATORY_CARE_PROVIDER_SITE_OTHER): Payer: Self-pay | Admitting: *Deleted

## 2021-07-23 ENCOUNTER — Ambulatory Visit (INDEPENDENT_AMBULATORY_CARE_PROVIDER_SITE_OTHER): Payer: Medicare Other | Admitting: Cardiology

## 2021-07-23 ENCOUNTER — Encounter: Payer: Self-pay | Admitting: *Deleted

## 2021-07-23 ENCOUNTER — Encounter: Payer: Self-pay | Admitting: Cardiology

## 2021-07-23 VITALS — BP 114/72 | HR 92 | Ht 67.0 in | Wt 186.4 lb

## 2021-07-23 DIAGNOSIS — I10 Essential (primary) hypertension: Secondary | ICD-10-CM

## 2021-07-23 DIAGNOSIS — Z720 Tobacco use: Secondary | ICD-10-CM

## 2021-07-23 NOTE — Patient Instructions (Addendum)

## 2021-07-23 NOTE — Progress Notes (Signed)
Cardiology Office Note  Date: 07/23/2021   ID: [name], DOB May 25, 1971, MRN [id number]  PCP:  Denny Levy, PA  Cardiologist:  Rozann Lesches, MD Electrophysiologist:  None   Chief Complaint  Patient presents with   Cardiac follow-up    History of Present Illness: [name] is a 50 y.o. female last seen in July 2021 by Mr. Leonides Sake NP.  She is here for a follow-up visit.  She tells me that she has had a very stressful year.  She has also been trying to quit smoking, but has had a lot of difficulty with this despite Wellbutrin.  She does have occasional episodes of left arm numbness and sometimes associated chest discomfort.  These are not specifically related to exertion and could potentially be stress related as well.  Cardiac catheterization from September of last year showed normal coronary arteries, and prior echocardiogram showed LVEF 60 to 65%.  She continues on medical therapy for risk factor reduction per PCP, we are requesting her most recent lab work.  I reviewed her medications which are noted below.  Past Medical History:  Diagnosis Date   Benign paroxysmal vertigo    Cervicalgia    Depression    Diabetes mellitus (HCC)    GERD (gastroesophageal reflux disease)    Hiatal hernia    History of cardiac catheterization    Normal coronary arteries 2016 and 2021   History of TIA (transient ischemic attack)    Hyperlipidemia    Hypertension    IBS (irritable bowel syndrome)    Migraine headache    Panic disorder with agoraphobia    Peripheral neuropathy    Subungual hematoma of digit of hand    Tendonitis    Vertigo     Past Surgical History:  Procedure Laterality Date   ABDOMINAL HYSTERECTOMY     APPENDECTOMY     CARDIAC CATHETERIZATION   last in 2009   x 4, normal coronary arteries   CARDIAC CATHETERIZATION N/A 03/26/2015   Procedure: Left Heart Cath and Coronary Angiography;  Surgeon: Wellington Hampshire, MD;  Location: [facility];   Service: Cardiovascular;  Laterality: N/A;   CARPAL TUNNEL RELEASE Right 08/24/2018   Procedure: CARPAL TUNNEL RELEASE;  Surgeon: Carole Civil, MD;  Location: [facility];  Service: Orthopedics;  Laterality: Right;   CESAREAN SECTION     CHOLECYSTECTOMY     COLONOSCOPY WITH PROPOFOL N/A 01/09/2018   Procedure: COLONOSCOPY WITH PROPOFOL;  Surgeon: Rogene Houston, MD;  Location: [facility];  Service: Endoscopy;  Laterality: N/A;   COLONOSCOPY WITH PROPOFOL N/A 09/21/2019   Procedure: COLONOSCOPY WITH PROPOFOL;  Surgeon: Rogene Houston, MD;  [facility]: [facility];  Service: Endoscopy;  Laterality: N/A;  1055   CYST EXCISION     right breast   ESOPHAGOGASTRODUODENOSCOPY (EGD) WITH PROPOFOL N/A 01/09/2018   Procedure: ESOPHAGOGASTRODUODENOSCOPY (EGD) WITH PROPOFOL;  Surgeon: Rogene Houston, MD;  [facility]: [facility];  Service: Endoscopy;  Laterality: N/A;   LEFT HEART CATH AND CORONARY ANGIOGRAPHY N/A 02/25/2020   Procedure: LEFT HEART CATH AND CORONARY ANGIOGRAPHY;  Surgeon: Troy Sine, MD;  Location: [facility];  Service: Cardiovascular;  Laterality: N/A;   POLYPECTOMY  09/21/2019   Procedure: POLYPECTOMY;  Surgeon: Rogene Houston, MD;  [facility]: [facility];  Service: Endoscopy;;  ascending colon;   TOOTH EXTRACTION Bilateral 02/16/2018   Procedure: 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 1 each 3   diazepam (VALIUM) 5 MG tablet Take 5 mg by mouth 3 (three) times daily as needed for anxiety.      docusate sodium  (COLACE) 100 MG capsule Take 100-200 mg by mouth See admin instructions. Take 100 mg by mouth in the morning and 200 mg in the evening     EPIPEN 2-PAK 0.3 MG/0.3ML SOAJ injection Inject 0.3 mg into the muscle as needed for anaphylaxis.      estradiol (ESTRACE) 1 MG tablet Take 1 mg by mouth daily.     furosemide (LASIX) 40 MG tablet Take 40 mg by mouth daily.     Glucagon 3 MG/DOSE POWD Place 3 mg into the nose once as needed for up to 1 dose. 1 each 11   insulin aspart (NOVOLOG) 100 UNIT/ML injection Inject up to 60 units in the pump daily 60 mL 3   Insulin Aspart FlexPen (NOVOLOG) 100 UNIT/ML INJECT UP TO 70 UNITS A DAY. 15 mL 0   Insulin Disposable Pump (OMNIPOD 5 G6 POD, GEN 5,) MISC 1 applicator by Does not apply route every 3 (three) days. 30 each 3   Insulin Human (INSULIN PUMP) SOLN Inject 1 each into the skin as directed. Humalog insulin     isosorbide mononitrate (IMDUR) 30 MG 24 hr tablet Take 1 tablet (30 mg total) by mouth daily. 90 tablet 1   lisinopril (PRINIVIL,ZESTRIL) 5 MG tablet Take 5 mg by mouth daily.  2   nitroGLYCERIN (NITROSTAT) 0.4 MG SL tablet Place 1 tablet (0.4 mg total) under the tongue every 5 (five) minutes x 3 doses as needed for chest pain (if no relief after 3rd dose, proceed to the ED for an evaluation or call 911). 25 tablet 3   Omega-3 Fatty Acids (FISH OIL) 1200 MG CAPS Take 1,200 mg by mouth daily.      oxyCODONE-acetaminophen (PERCOCET/ROXICET) 5-325 MG tablet TAKE 1 TABLET EVERY 6 HOURS AS NEEDED FOR SEVERE PAIN. 30 tablet 0   pravastatin (PRAVACHOL) 20 MG tablet TAKE 1 TABLET BY MOUTH AT BEDTIME. 30 tablet 0   pregabalin (LYRICA) 300 MG capsule Take 300 mg by mouth 2 (two) times daily.     promethazine (PHENERGAN) 25 MG tablet Take 1 tablet (25 mg total) by mouth every 6 (six) hours as needed for nausea or vomiting. 30 tablet 0   sennosides-docusate sodium (SENOKOT-S) 8.6-50 MG tablet Take 1 tablet by mouth daily as needed for constipation.     No  current facility-administered medications for this visit.   Allergies:  Bee venom, Zocor [simvastatin], Clindamycin/lincomycin, Codeine, Penicillins, and Sulfa antibiotics   ROS: No palpitations or syncope.  Physical Exam: VS:  BP 114/72    Pulse 92    Ht 5\' 7"  (1.702 m)    Wt 186 lb 6.4 oz (84.6 kg)    SpO2 98%    BMI 29.19 kg/m , BMI Body mass index is 29.19 kg/m.  Wt Readings from Last 3 Encounters:  07/23/21 186 lb 6.4 oz (84.6 kg)  02/16/21 176 lb  12.8 oz (80.2 kg)  10/17/20 174 lb 3.2 oz (79 kg)    General: Patient appears comfortable at rest. HEENT: Conjunctiva and lids normal, wearing a mask. Neck: Supple, no elevated JVP or carotid bruits, no thyromegaly. Lungs: Clear to auscultation, nonlabored breathing at rest. Cardiac: Regular rate and rhythm, no S3 or significant systolic murmur, no pericardial rub.  ECG:  An ECG dated 06/17/2021 was personally reviewed today and demonstrated:  Sinus rhythm.  Recent Labwork: 10/17/2020: TSH 4.26     Component Value Date/Time   CHOL 144 04/04/2020 1504   TRIG 87.0 04/04/2020 1504   HDL 44.20 04/04/2020 1504   CHOLHDL 3 04/04/2020 1504   VLDL 17.4 04/04/2020 1504   LDLCALC 82 04/04/2020 1504    Other Studies Reviewed Today:  Echocardiogram 11/29/2019:  1. Left ventricular ejection fraction, by estimation, is 60 to 65%. The  left ventricle has normal function. The left ventricle has no regional  wall motion abnormalities. Left ventricular diastolic parameters were  normal.   2. Right ventricular systolic function is normal. The right ventricular  size is normal.   3. The mitral valve is normal in structure. No evidence of mitral valve  regurgitation. No evidence of mitral stenosis.   4. The aortic valve is tricuspid. Aortic valve regurgitation is not  visualized. No aortic stenosis is present.   5. The inferior vena cava is normal in size with greater than 50%  respiratory variability, suggesting right atrial pressure of 3  mmHg.   Cardiac catheterization 02/25/2020: The left ventricular systolic function is normal. LV end diastolic pressure is normal.   Normal epicardial coronary arteries in a dominant left coronary system.   Hyperdynamic LV function with EF at 65%; LVEDP 17 mmHg.  Assessment and Plan:  1.  History of chest pain with cardiac catheterization from July 2021 showing normal coronary arteries.  LVEF 60 to 65% by echocardiogram last year as well.  She does have a family history of premature CAD and also personal risk factors including type 2 diabetes mellitus, hyperlipidemia, and tobacco use.  She is trying to work on smoking cessation.  Currently remains on Pravachol and lisinopril.  She is also on Imdur which is reasonable in case there is any component of microvascular angina.  No changes were made today.  2.  Mixed hyperlipidemia, on Pravachol.  Requesting interval lab work from PCP.  Medication Adjustments/Labs and Tests Ordered: Current medicines are reviewed at length with the patient today.  Concerns regarding medicines are outlined above.   Tests Ordered: No orders of the defined types were placed in this encounter.   Medication Changes: No orders of the defined types were placed in this encounter.   Disposition:  Follow up  1 year.  Signed, Satira Sark, MD, [facility] 07/23/2021 2:42 PM    [facility] [facility] [address], [place], [place] [address] Phone: [phone]; Fax: [phone]

## 2021-08-10 ENCOUNTER — Ambulatory Visit: Payer: Commercial Managed Care - HMO | Admitting: Internal Medicine

## 2021-08-18 ENCOUNTER — Encounter (INDEPENDENT_AMBULATORY_CARE_PROVIDER_SITE_OTHER): Payer: Self-pay

## 2021-08-18 ENCOUNTER — Ambulatory Visit (INDEPENDENT_AMBULATORY_CARE_PROVIDER_SITE_OTHER): Payer: Medicare Other | Admitting: Gastroenterology

## 2021-08-31 ENCOUNTER — Ambulatory Visit (INDEPENDENT_AMBULATORY_CARE_PROVIDER_SITE_OTHER): Payer: Medicare Other | Admitting: Podiatry

## 2021-08-31 ENCOUNTER — Other Ambulatory Visit: Payer: Self-pay

## 2021-08-31 DIAGNOSIS — M79671 Pain in right foot: Secondary | ICD-10-CM

## 2021-08-31 MED ORDER — MELOXICAM 15 MG PO TABS: 15.0000 mg | ORAL_TABLET | Freq: Every day | ORAL | 1 refills | Status: DC

## 2021-08-31 NOTE — Progress Notes (Signed)
° °  HPI: 51 y.o. female presenting today for follow-up evaluation of slight pain to the bilateral feet.  Patient states that "the feet just do not feel right".  She presents for further treatment and evaluation  Past Medical History:  Diagnosis Date   Benign paroxysmal vertigo    Cervicalgia    Depression    Diabetes mellitus ([place])    GERD (gastroesophageal reflux disease)    Hiatal hernia    History of cardiac catheterization    Normal coronary arteries 2016 and 2021   History of TIA (transient ischemic attack)    Hyperlipidemia    Hypertension    IBS (irritable bowel syndrome)    Migraine headache    Panic disorder with agoraphobia    Peripheral neuropathy    Subungual hematoma of digit of hand    Tendonitis    Vertigo      Physical Exam: General: The patient is alert and oriented x3 in no acute distress.  Dermatology: Skin is warm, dry and supple bilateral lower extremities. Negative for open lesions or macerations.  Vascular: Palpable pedal pulses bilaterally. No edema or erythema noted. Capillary refill within normal limits.  Neurological: Epicritic and protective threshold grossly intact bilaterally.   Musculoskeletal Exam: Range of motion within normal limits to all pedal and ankle joints bilateral. Muscle strength 5/5 in all groups bilateral.  Tenderness to palpation to the right second digit.  Overall there is good alignment of the toes there is some limited range of motion of the first MTP joint of the right foot specifically with plantar flexion. There is also some weakness with dorsiflexion and eversion of the foot against resistance.   Assessment: 1.  History of bilateral foot surgery 2.  Pain with weakness right foot and ankle   Plan of Care:  1. Patient evaluated. X-Rays reviewed.  2.  Comprehensive exam was performed today and there is nothing specifically that elicits any of the patient's pain and tenderness.  There is some concern due to the weakness of  the right foot.  Physical therapy was offered however the patient declined 3.  Ankle brace dispensed.  Wear daily 4.  Prescription for meloxicam 15 mg daily 5.  Return to clinic as needed   Edrick Kins, DPM [facility]  Dr. Edrick Kins, DPM    [address], [place] [address]                Office [phone]  Fax [phone]

## 2021-09-03 ENCOUNTER — Ambulatory Visit: Payer: Medicare Other | Admitting: Cardiology

## 2021-09-09 ENCOUNTER — Other Ambulatory Visit: Payer: Self-pay | Admitting: Internal Medicine

## 2021-09-09 ENCOUNTER — Institutional Professional Consult (permissible substitution): Payer: Medicare Other | Admitting: Pulmonary Disease

## 2021-09-14 ENCOUNTER — Ambulatory Visit (INDEPENDENT_AMBULATORY_CARE_PROVIDER_SITE_OTHER): Payer: Medicare Other | Admitting: Internal Medicine

## 2021-09-14 ENCOUNTER — Encounter: Payer: Self-pay | Admitting: Internal Medicine

## 2021-09-14 ENCOUNTER — Telehealth: Payer: Self-pay

## 2021-09-14 ENCOUNTER — Other Ambulatory Visit: Payer: Self-pay

## 2021-09-14 VITALS — BP 120/88 | HR 93 | Ht 67.0 in | Wt 192.0 lb

## 2021-09-14 DIAGNOSIS — E782 Mixed hyperlipidemia: Secondary | ICD-10-CM

## 2021-09-14 DIAGNOSIS — E1042 Type 1 diabetes mellitus with diabetic polyneuropathy: Secondary | ICD-10-CM | POA: Diagnosis not present

## 2021-09-14 DIAGNOSIS — R7989 Other specified abnormal findings of blood chemistry: Secondary | ICD-10-CM

## 2021-09-14 DIAGNOSIS — E1065 Type 1 diabetes mellitus with hyperglycemia: Secondary | ICD-10-CM | POA: Diagnosis not present

## 2021-09-14 LAB — POCT GLYCOSYLATED HEMOGLOBIN (HGB A1C): Hemoglobin A1C: 9.5 % — AB (ref 4.0–5.6)

## 2021-09-14 MED ORDER — METFORMIN HCL 500 MG PO TABS: 500.0000 mg | ORAL_TABLET | Freq: Two times a day (BID) | ORAL | 3 refills | Status: DC

## 2021-09-14 NOTE — Telephone Encounter (Signed)
Pt came in for appt and her Omnipod 5 is not connected to clinic.

## 2021-09-14 NOTE — Patient Instructions (Addendum)
Please use the following pump settings: - basal rates: 12 am: 0.6 - ICR:              12 am: 1:14 >> 1:11 - target: 70-110 - ISF: 50 - Active insulin time: 4 hours  Please do the following approximately 15 minutes before every meal: - Enter carbs (C) - Enter sugars (S) - Start insulin bolus (I)  Please start Metformin 500 mg with dinner x 4 days. If you tolerate this well, add another Metformin tablet (500 mg) with breakfast. Continue with 500 mg of metformin 2x a day with breakfast and dinner.  Please return in 3-4 months.   Please stop at [facility] lab.

## 2021-09-14 NOTE — Progress Notes (Addendum)
Patient ID: [name], female   DOB: September 17, 1970, 51 y.o.   MRN: [id number]  This visit occurred during the SARS-CoV-2 public health emergency.  Safety protocols were in place, including screening questions prior to the visit, additional usage of staff PPE, and extensive cleaning of exam room while observing appropriate contact time as indicated for disinfecting solutions.     HPI: [name] is a 51 y.o.-year-old female, returning for f/u for DM1, dx in 41 (51 y/o), uncontrolled, with complications (cerebro-vascular ds - h/o TIA 0/2016, PN, DR). Last visit 7 months ago.  Insurance: [employer].  Interim history: No increased urination, blurry vision, chest pain.  She continues to have numbness and tingling in her feet. At last visit she described right hip pain ("pinched nerve") for 1 mo >>  better now. She had a URI, URI, Covid19 - this winter. She was Prednisone 2x. Sugars were higher. She has some nasuea >> will see GI Since last visit she changed her pump from t:slim to OmniPod 5.   Insulin pump: -Previously OmniPod -Then Medtronic 670 G insulin pump but could not afford the Enlite CGM -then T: slim X2-started 10/2019 -now Omnipod5 - started 06/30/2021 - likes this better.  CGM: -Dexcom G6  Insulin: -Sugars improved after her insurance switched coverage from Humalog to NovoLog  Supplies:  -[place]  Reviewed HbA1c levels: Lab Results  Component Value Date   HGBA1C 9.5 (A) 02/16/2021   HGBA1C 9.2 (A) 10/17/2020   HGBA1C 8.6 (A) 04/04/2020   Previously on: - Lantus 25 units in am and 10 units at night - Novolog ICR 1:17, target 150, ISF 1:25  Now on insulin pump: Pump settings:  - basal rates: 12 am: 0.6 2 pm: 0.6 2 pm : 0.7 >> 0.6 - ICR:              12 am: 1:14 - target: 100-100 >> 70-110 - ISF: 50 - Active insulin time: 3 >> 4 hours  She checks her sugars more than 4 times a day with her CGM:   Previously:   Previously:   Lowest sugar  was 27 in 07/2014... >>  57 >> 64 >> 88 >> 30s (!) - sensor pb.. No hypoglycemia awareness!  She has a glucagon pen at home. Highest sugar was 601 .. >> 1200 (!!!) ...>> 296 >> 400 >> 500 (infusion site pb) >> 400s (Covid19) No history of hypoglycemia admissions.   She  had hyperglycemia ED visits and an admission in 12/2017. She had a DKA admission in 09/2018 - with suspicion of stroke.  This was ruled out by MRI and she also had normal carotid Dopplers afterwards.   -No CKD, last BUN/creatinine:  Lab Results  Component Value Date   BUN 13 02/21/2020   CREATININE 0.68 02/21/2020  On Lisinopril 5 mg daily.  -+ HL; last set of lipids: Lab Results  Component Value Date   CHOL 144 04/04/2020   HDL 44.20 04/04/2020   LDLCALC 82 04/04/2020   TRIG 87.0 04/04/2020   CHOLHDL 3 04/04/2020  On pravastatin 20, fish oil 1200 mg daily.  - last eye exam was in 04/2021: + DR.  -+ Numbness and tingling in feet and hands. She was previously on Neurontin but she stopped due to side effects. Currently on Lyrica.  Reviewed B12 levels: Lab Results  Component Value Date   VITAMINB12 650 10/17/2020   VITAMINB12 >1526 (H) 04/04/2020   VITAMINB12 >1500 (H) 12/26/2018   VITAMINB12 481 12/06/2014  She stopped B12 in 2020.  No history of hypothyroidism: Lab Results  Component Value Date   TSH 4.26 10/17/2020   On Diazepam. Stopped Wellbutrin due to constipation. She had tooth surgery in 01/2018, Dr. Britta Mccreedy.   She had bilateral big toe surgery in 09/2016 with Dr. Amalia Hailey.  ROS: + See HPI  I reviewed pt's medications, allergies, PMH, social hx, family hx, and changes were documented in the history of present illness. Otherwise, unchanged from my initial visit note.  Past Medical History:  Diagnosis Date   Benign paroxysmal vertigo    Cervicalgia    Depression    Diabetes mellitus (HCC)    GERD (gastroesophageal reflux disease)    Hiatal hernia    History of cardiac catheterization     Normal coronary arteries 2016 and 2021   History of TIA (transient ischemic attack)    Hyperlipidemia    Hypertension    IBS (irritable bowel syndrome)    Migraine headache    Panic disorder with agoraphobia    Peripheral neuropathy    Subungual hematoma of digit of hand    Tendonitis    Vertigo    Past Surgical History:  Procedure Laterality Date   ABDOMINAL HYSTERECTOMY     APPENDECTOMY     CARDIAC CATHETERIZATION   last in 2009   x 4, normal coronary arteries   CARDIAC CATHETERIZATION N/A 03/26/2015   Procedure: Left Heart Cath and Coronary Angiography;  Surgeon: Wellington Hampshire, MD;  Location: [facility];  Service: Cardiovascular;  Laterality: N/A;   CARPAL TUNNEL RELEASE Right 08/24/2018   Procedure: CARPAL TUNNEL RELEASE;  Surgeon: Carole Civil, MD;  Location: [facility];  Service: Orthopedics;  Laterality: Right;   CESAREAN SECTION     CHOLECYSTECTOMY     COLONOSCOPY WITH PROPOFOL N/A 01/09/2018   Procedure: COLONOSCOPY WITH PROPOFOL;  Surgeon: Rogene Houston, MD;  Location: [facility];  Service: Endoscopy;  Laterality: N/A;   COLONOSCOPY WITH PROPOFOL N/A 09/21/2019   Procedure: COLONOSCOPY WITH PROPOFOL;  Surgeon: Rogene Houston, MD;  Location: [facility];  Service: Endoscopy;  Laterality: N/A;  1055   CYST EXCISION     right breast   ESOPHAGOGASTRODUODENOSCOPY (EGD) WITH PROPOFOL N/A 01/09/2018   Procedure: ESOPHAGOGASTRODUODENOSCOPY (EGD) WITH PROPOFOL;  Surgeon: Rogene Houston, MD;  [facility]: [facility];  Service: Endoscopy;  Laterality: N/A;   LEFT HEART CATH AND CORONARY ANGIOGRAPHY N/A 02/25/2020   Procedure: LEFT HEART CATH AND CORONARY ANGIOGRAPHY;  Surgeon: Troy Sine, MD;  Location: [facility];  Service: Cardiovascular;  Laterality: N/A;   POLYPECTOMY  09/21/2019   Procedure: POLYPECTOMY;  Surgeon: Rogene Houston, MD;  [facility]: [facility];  Service: Endoscopy;;  ascending colon;   TOOTH EXTRACTION Bilateral 02/16/2018    Procedure: 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 RIGHT MAXILLARY SINUS ANTROSTOMY;  Surgeon: Diona Browner, DDS;  Location: [facility];  Service: Oral Surgery;  Laterality: Bilateral;   TUBAL LIGATION     History   Social History   Marital Status: Divorced    Spouse Name: N/A   Number of Children: 1   Occupational History   disabled   Social History Main Topics   Smoking status: Current Every Day Smoker -- 0.50 packs/day for 11 years   Smokeless tobacco: Never Used     Comment: patient is aware that she needs to quit smoking   Alcohol Use: No   Drug Use: No  Current Outpatient Medications on File Prior to Visit  Medication Sig Dispense Refill   Continuous Blood Gluc Sensor (DEXCOM G6 SENSOR) MISC Use as instructed to check sugar 4 times daily change every 10 days 9 each 3   Continuous Blood Gluc Transmit (DEXCOM G6 TRANSMITTER) MISC Use as directed change every 90 days. 1 each 3   diazepam (VALIUM) 5 MG tablet Take 5 mg by mouth 3 (three) times daily as needed for anxiety.      docusate sodium (COLACE) 100 MG capsule Take 100-200 mg by mouth See admin instructions. Take 100 mg by mouth in the morning and 200 mg in the evening     EPIPEN 2-PAK 0.3 MG/0.3ML SOAJ injection Inject 0.3 mg into the muscle as needed for anaphylaxis.      estradiol (ESTRACE) 1 MG tablet Take 1 mg by mouth daily.     furosemide (LASIX) 40 MG tablet Take 40 mg by mouth daily.     Glucagon 3 MG/DOSE POWD Place 3 mg into the nose once as needed for up to 1 dose. 1 each 11   insulin aspart (NOVOLOG) 100 UNIT/ML injection Inject up to 60 units in the pump daily 60 mL 3   Insulin Aspart FlexPen (NOVOLOG) 100 UNIT/ML INJECT UP TO 70 UNITS A DAY. 15 mL 0   Insulin Disposable Pump (OMNIPOD 5 G6 POD, GEN 5,) MISC 1 applicator by Does not apply route every 3 (three) days. 30 each 3   Insulin Human (INSULIN PUMP) SOLN Inject 1 each into the skin as directed. Humalog insulin     isosorbide mononitrate (IMDUR)  30 MG 24 hr tablet Take 1 tablet (30 mg total) by mouth daily. 90 tablet 1   lisinopril (PRINIVIL,ZESTRIL) 5 MG tablet Take 5 mg by mouth daily.  2   meloxicam (MOBIC) 15 MG tablet Take 1 tablet (15 mg total) by mouth daily. 30 tablet 1   nitroGLYCERIN (NITROSTAT) 0.4 MG SL tablet Place 1 tablet (0.4 mg total) under the tongue every 5 (five) minutes x 3 doses as needed for chest pain (if no relief after 3rd dose, proceed to the ED for an evaluation or call 911). 25 tablet 3   Omega-3 Fatty Acids (FISH OIL) 1200 MG CAPS Take 1,200 mg by mouth daily.      oxyCODONE-acetaminophen (PERCOCET/ROXICET) 5-325 MG tablet TAKE 1 TABLET EVERY 6 HOURS AS NEEDED FOR SEVERE PAIN. 30 tablet 0   pravastatin (PRAVACHOL) 20 MG tablet TAKE 1 TABLET BY MOUTH AT BEDTIME. 90 tablet 0   pregabalin (LYRICA) 300 MG capsule Take 300 mg by mouth 2 (two) times daily.     promethazine (PHENERGAN) 25 MG tablet Take 1 tablet (25 mg total) by mouth every 6 (six) hours as needed for nausea or vomiting. 30 tablet 0   sennosides-docusate sodium (SENOKOT-S) 8.6-50 MG tablet Take 1 tablet by mouth daily as needed for constipation.     No current facility-administered medications on file prior to visit.   Allergies  Allergen Reactions   Bee Venom Anaphylaxis and Hives   Zocor [Simvastatin] Other (See Comments)    Muscle aches and pains   Clindamycin/Lincomycin Rash   Codeine Rash   Penicillins Rash    Did it involve swelling of the face/tongue/throat, SOB, or low BP? No Did it involve sudden or severe rash/hives, skin peeling, or any reaction on the inside of your mouth or nose? Yes Did you need to seek medical attention at a hospital or doctor's office? Yes When  did it last happen?      30 years If all above answers are NO, may proceed with cephalosporin use.    Sulfa Antibiotics Rash        Family History  Problem Relation Age of Onset   Stroke Mother 75   Heart attack Mother 32   Cancer Sister        colon    Heart failure Maternal Grandmother 79   Cancer Maternal Grandfather        prostate   Heart failure Paternal Grandmother 32   Heart failure Paternal Grandfather 47   PE: BP 120/88 (BP Location: Right Arm, Patient Position: Sitting, Cuff Size: Normal)    Pulse 93    Ht 5\' 7"  (1.702 m)    Wt 192 lb (87.1 kg)    SpO2 96%    BMI 30.07 kg/m   Wt Readings from Last 3 Encounters:  09/14/21 192 lb (87.1 kg)  07/23/21 186 lb 6.4 oz (84.6 kg)  02/16/21 176 lb 12.8 oz (80.2 kg)   Constitutional: overweight, in NAD Eyes: PERRLA, EOMI, no exophthalmos ENT: moist mucous membranes, no thyromegaly, no cervical lymphadenopathy Cardiovascular: RRR, No MRG Respiratory: CTA B Musculoskeletal: no deformities, strength intact in all 4 Skin: moist, warm, no rashes Neurological: no tremor with outstretched hands, DTR normal in all 4  ASSESSMENT: 1. DM1, uncontrolled, with complications - cerebro-vascular ds - h/o TIA 0/2016 - PN - DR Sees cardiology >> Dr. Maurice Small - [facility]  2. HL  3.  High B12  PLAN:  1. Patient with longstanding, uncontrolled, type 1 diabetes, on insulin pump, changed from t:slim x2 to OmniPod 5 since last visit.  She was previously off insulin pump after her episode of DKA in 09/2018 and sugars improved after she started the t:slim pump.  Sugars improved even more after switching to the OmniPod 5, however, since last visit, she has been quite sick with UTI, URI, and COVID-19 and they were higher over the holidays.  They started to improve in the last month. -In the past, she was relying almost exclusively on automatic boluses for the meals.  She is doing a better job with this CGM interpretation: -At today's visit, we reviewed her CGM downloads: It appears that 54.8% of values are in target range (goal >70%), while 45.2% are higher than 180 (goal <25%), and 0% are lower than 70 (goal <4%).  The calculated average blood sugar is 182.  The projected HbA1c for the next 3  months (GMI) is 7.7%. -Reviewing the CGM trends, it appears that her sugars are better controlled overnight, increasing after the first meal of the day and peaking around 3 PM.  Sugars decreased a little afterwards, but they increase again after dinner.  At today's visit, unfortunately, we cannot download her pump but I reviewed the pump records on her pod.  It appears that she is doing a better job entering carbs with meals, but she still does not enter them for every meal.  In the past, she was relying almost exclusively on automatic boluses for the meals.  Sugars appear to be increasing consistently after the first meal of the day despite the fact that she is entering carbs for this.  Therefore, I advised her to strengthen her insulin to carb ratio with breakfast.  Her breakfast is late and is usually a brunch.  With dinner, she does enter carbs but I do not feel that she enters enough (mostly 20-30g carbs).  I did advise  her to try to enter more but I advised her to strengthen the insulin to carb ratio with dinner, also. -I do not feel that she otherwise needs changes in her pump settings.  -I helped her entering the setting changes in her pump -She is wondering about ways to decrease her insulin resistance.  We discussed about GLP-1 receptor agonists which are not FDA approved for type 1 diabetes and many are on backorder right now due to wide demand.  For now I suggested to try metformin at maximal dose.  Discussed about possible side effects and advised her to take it after meals.  She agrees to try.  I did advise her to start it after she sees GI next week.  She will be investigated for nausea.  - I suggested to:  Patient Instructions  Please use the following pump settings: - basal rates: 12 am: 0.6 - ICR:              12 am: 1:14 >> 1:11 - target: 70-110 - ISF: 50 - Active insulin time: 4 hours  Please do the following approximately 15 minutes before every meal: - Enter carbs (C) - Enter  sugars (S) - Start insulin bolus (I)  Please start Metformin 500 mg with dinner x 4 days. If you tolerate this well, add another Metformin tablet (500 mg) with breakfast. Continue with 500 mg of metformin 2x a day with breakfast and dinner.  Please return in 3-4 months.   [facility] stop at [facility] lab.  - we checked her HbA1c: 9.5% (stable, higher than expected from her CGM) - advised to check sugars at different times of the day - 4x a day, rotating check times - advised for yearly eye exams >> she is UTD - will check annual labs today - UTD with foot exam per podiatry - return to clinic in 3-4 months  2. HL -Reviewed latest lipid panel from 04/2020: Fractions at goal: Lab Results  Component Value Date   CHOL 144 04/04/2020   HDL 44.20 04/04/2020   LDLCALC 82 04/04/2020   TRIG 87.0 04/04/2020   CHOLHDL 3 04/04/2020  -Continues pravastatin 20 mg daily without side effects -We will check a lipid panel now  3.  High B12 -She had a high B12 level before but she stopped supplements since then. -She does feel fatigued -Most recent B12 level was at goal: Lab Results  Component Value Date   VITAMINB12 650 10/17/2020  -We will repeat this today  Total time spent for the visit: 40 min, in reviewing her pump records, discussing her hyper-glycemic episodes, reviewing previous labs and pump settings and developing a plan to avoid hypo- and hyper-glycemia.   Patient did not stop at the lab...  Component     Latest Ref Rng & Units 10/06/2021  Sodium     135 - 145 mEq/L 133 (L)  Potassium     3.5 - 5.1 mEq/L 4.4  Chloride     96 - 112 mEq/L 101  CO2     19 - 32 mEq/L 25  Glucose     70 - 99 mg/dL 305 (H)  BUN     6 - 23 mg/dL 14  Creatinine     0.40 - 1.20 mg/dL 0.80  Total Bilirubin     0.2 - 1.2 mg/dL 0.5  Alkaline Phosphatase     39 - 117 U/L 81  AST     0 - 37 U/L 20  ALT  0 - 35 U/L 14  Total Protein     6.0 - 8.3 g/dL 6.5  Albumin     3.5 - 5.2 g/dL 4.2  GFR      >60.00 mL/min 85.48  Calcium     8.4 - 10.5 mg/dL 9.0  Cholesterol     0 - 200 mg/dL 162  Triglycerides     0.0 - 149.0 mg/dL 121.0  HDL Cholesterol     >39.00 mg/dL 59.10  VLDL     0.0 - 40.0 mg/dL 24.2  LDL (calc)     0 - 99 mg/dL 79  Total CHOL/HDL Ratio      3  NonHDL      103.05  Microalb, Ur     0.0 - 1.9 mg/dL 1.1  Creatinine,U     mg/dL 103.1  MICROALB/CREAT RATIO     0.0 - 30.0 mg/g 1.0  Vitamin B12     211 - 911 pg/mL >1504 (H)  TSH     0.35 - 5.50 uIU/mL 2.82   Glucose is high with dilutional hyponatremia.  The rest of the labs are normal with the exception of a high vitamin B12.  I will check with her if she restarted B12 supplements.  Philemon Kingdom, MD PhD [facility]

## 2021-09-15 NOTE — Telephone Encounter (Signed)
She was linked when she left here, but she canceled her appointment to finish the pump training.  I will try to reconnect her and let you know.

## 2021-09-24 ENCOUNTER — Telehealth: Payer: Self-pay

## 2021-09-24 NOTE — Telephone Encounter (Signed)
Inbound vm from [place] with Denzil Hughes requesting clinical notes be faxed to [phone]. Clinical notes routed via Epic to number provided.

## 2021-09-28 ENCOUNTER — Encounter (INDEPENDENT_AMBULATORY_CARE_PROVIDER_SITE_OTHER): Payer: Self-pay | Admitting: Gastroenterology

## 2021-09-28 ENCOUNTER — Ambulatory Visit (INDEPENDENT_AMBULATORY_CARE_PROVIDER_SITE_OTHER): Payer: Medicare Other | Admitting: Gastroenterology

## 2021-09-29 ENCOUNTER — Encounter (INDEPENDENT_AMBULATORY_CARE_PROVIDER_SITE_OTHER): Payer: Self-pay | Admitting: *Deleted

## 2021-10-05 ENCOUNTER — Telehealth: Payer: Self-pay | Admitting: Nutrition

## 2021-10-06 ENCOUNTER — Other Ambulatory Visit (INDEPENDENT_AMBULATORY_CARE_PROVIDER_SITE_OTHER): Payer: Medicare Other

## 2021-10-06 ENCOUNTER — Encounter: Payer: Medicare Other | Attending: Internal Medicine | Admitting: Nutrition

## 2021-10-06 ENCOUNTER — Other Ambulatory Visit: Payer: Self-pay

## 2021-10-06 DIAGNOSIS — E1065 Type 1 diabetes mellitus with hyperglycemia: Secondary | ICD-10-CM | POA: Diagnosis not present

## 2021-10-06 DIAGNOSIS — E1042 Type 1 diabetes mellitus with diabetic polyneuropathy: Secondary | ICD-10-CM | POA: Diagnosis not present

## 2021-10-06 DIAGNOSIS — R7989 Other specified abnormal findings of blood chemistry: Secondary | ICD-10-CM

## 2021-10-06 DIAGNOSIS — E782 Mixed hyperlipidemia: Secondary | ICD-10-CM | POA: Diagnosis not present

## 2021-10-06 LAB — COMPREHENSIVE METABOLIC PANEL
ALT: 14 U/L (ref 0–35)
AST: 20 U/L (ref 0–37)
Albumin: 4.2 g/dL (ref 3.5–5.2)
Alkaline Phosphatase: 81 U/L (ref 39–117)
BUN: 14 mg/dL (ref 6–23)
CO2: 25 mEq/L (ref 19–32)
Calcium: 9 mg/dL (ref 8.4–10.5)
Chloride: 101 mEq/L (ref 96–112)
Creatinine, Ser: 0.8 mg/dL (ref 0.40–1.20)
GFR: 85.48 mL/min (ref >60.00)
Glucose, Bld: 305 mg/dL — ABNORMAL HIGH (ref 70–99)
Potassium: 4.4 mEq/L (ref 3.5–5.1)
Sodium: 133 mEq/L — ABNORMAL LOW (ref 135–145)
Total Bilirubin: 0.5 mg/dL (ref 0.2–1.2)
Total Protein: 6.5 g/dL (ref 6.0–8.3)

## 2021-10-06 LAB — MICROALBUMIN / CREATININE URINE RATIO
Creatinine,U: 103.1 mg/dL
Microalb Creat Ratio: 1 mg/g (ref 0.0–30.0)
Microalb, Ur: 1.1 mg/dL (ref 0.0–1.9)

## 2021-10-06 LAB — LIPID PANEL
Cholesterol: 162 mg/dL (ref 0–200)
HDL: 59.1 mg/dL (ref >39.00)
LDL Cholesterol: 79 mg/dL (ref 0–99)
NonHDL: 103.05
Total CHOL/HDL Ratio: 3
Triglycerides: 121 mg/dL (ref 0.0–149.0)
VLDL: 24.2 mg/dL (ref 0.0–40.0)

## 2021-10-06 LAB — TSH: TSH: 2.82 u[IU]/mL (ref 0.35–5.50)

## 2021-10-06 LAB — VITAMIN B12: Vitamin B-12: >1504 pg/mL — ABNORMAL HIGH (ref 211–911)

## 2021-10-07 ENCOUNTER — Other Ambulatory Visit: Payer: Self-pay | Admitting: Internal Medicine

## 2021-10-07 ENCOUNTER — Encounter: Payer: Self-pay | Admitting: Internal Medicine

## 2021-10-07 NOTE — Patient Instructions (Signed)
Bolus before meals 

## 2021-10-07 NOTE — Progress Notes (Signed)
Patient reports that she has had to go off her pump due to not being able to get supplies, as well as sometimes not having enough sensors due to distributor not being able to send them to her.  She was told to call the Dexcom help line and they will replace all sensors that have fallen off as well as those that did not work when she first put them on. ?She continues to take her bolus after eating, due to fear of low blood sugar-especially after supper.  Has had 3 lows in the last week 2 hours pcS, despite taking insulin pc.  Says that the pump will not keep her from dropping low.  Her I/C ratio was changed at supper from 10 to 13-reducing her dose by 1.5u.  Was told that lows may be less if she takes this before eating.  Discussed importance of calling office if this happens again, and to try to take the insulin immediately before eating, so as not to forget.   ?OmniPod helpline was called and patient's email was changed and podders central account was set up as well as [facility].  Pt. Is now linked to practice.   ?Will download her CGM readings in 10 days. ?She reports feeling bloated with constipation and says has an appointment with gastro MD in 2 weeks.  Says is taking laxative, without any relief.  Per Dr. Arman Filter verbal order to take magnesium, metamucil and suppository for this.   I also suggested drinking at least 8 glasses of water and a 4 ounce glass of warm prune juice-remembering to count carbs for this and take insulin.  She agreed to try both for some relief, before seeing MD  ?

## 2021-10-07 NOTE — Telephone Encounter (Signed)
Appt. Made for tomorrow to frinsh pump traning and link patient ?

## 2021-10-08 ENCOUNTER — Encounter (INDEPENDENT_AMBULATORY_CARE_PROVIDER_SITE_OTHER): Payer: Self-pay | Admitting: Gastroenterology

## 2021-10-08 ENCOUNTER — Other Ambulatory Visit: Payer: Self-pay

## 2021-10-08 ENCOUNTER — Other Ambulatory Visit (INDEPENDENT_AMBULATORY_CARE_PROVIDER_SITE_OTHER): Payer: Self-pay | Admitting: Gastroenterology

## 2021-10-08 ENCOUNTER — Ambulatory Visit (INDEPENDENT_AMBULATORY_CARE_PROVIDER_SITE_OTHER): Payer: Medicare Other | Admitting: Gastroenterology

## 2021-10-08 DIAGNOSIS — K589 Irritable bowel syndrome without diarrhea: Secondary | ICD-10-CM | POA: Insufficient documentation

## 2021-10-08 DIAGNOSIS — K581 Irritable bowel syndrome with constipation: Secondary | ICD-10-CM | POA: Diagnosis not present

## 2021-10-08 MED ORDER — PEG 3350-KCL-NA BICARB-NACL 420 G PO SOLR: 4000.0000 mL | Freq: Once | ORAL | 0 refills | Status: AC

## 2021-10-08 NOTE — Progress Notes (Signed)
Maylon Peppers, M.D. Gastroenterology & Hepatology [facility] [address] [place], [place] [address]  Primary Care Physician: Denny Levy, [place] [address]  I will communicate my assessment and recommendations to the referring MD via EMR.  Problems: IBS-C  History of Present Illness: [name] is a 51 y.o. female with Pmh  type 1 DM, depression, GERD, hyperlipidemia, IBS-C, panic disorder, peripheral neuropathy,, benign paroxysmal vertigo, who presents for follow up of chronic constipation.  The patient was last seen on 08/27/2019. At that time, the patient was prescribed Linzess 72 mcg every day and dicyclomine and as needed for abdominal pain.  She was scheduled for a colonoscopy given poor prep in the past with results of most recent colonoscopy described below.  Patient reports that she has tried prune juice, Metamucil, Miralax, magnesium citrate and other OTC laxatives in the past without significant relief of her constipation. Patient reports she took Linzess 145 mcg two pills every day for almost one year but it did not help her have regular bowel movements. She actually tried combining Linzess with Miralax but did not have any improvement of her symptoms. States for the last 6 months she has presented worsening bloating and discomfort of her abdomen. Has been having worsening bloating recently, with some pain in the right side of her colon. She is having a BM every 2 weeks - takes a laxative (Equate [employer] laxative) if she has not been able to move her bowels which somewhat helps. Is not on a frequent laxative regimen for constipation. She has frequent nausea. The patient denies having any nausea, vomiting, fever, chills, hematochezia, melena, hematemesis, diarrhea, jaundice, pruritus . Has gained 25 - 30 lb in the last year.  Most recent lab results from 10/06/2021 showed a normal TSH of 2.82, CMP with  sodium of 133, calcium of 9.0, all other electrolytes within normal limits, liver function tests were normal as well.  Last Colonoscopy: 09/21/2019, 6 mm polyp in the ascending colon removed with a cold cold snare, external hemorrhoids. Pathology showed sessile serrated polyp  Family history colon cancer - sister 71.  Recommend to repeat colonoscopy in 5 years.  Past Medical History: Past Medical History:  Diagnosis Date   Benign paroxysmal vertigo    Cervicalgia    Depression    Diabetes mellitus (HCC)    GERD (gastroesophageal reflux disease)    Hiatal hernia    History of cardiac catheterization    Normal coronary arteries 2016 and 2021   History of TIA (transient ischemic attack)    Hyperlipidemia    Hypertension    IBS (irritable bowel syndrome)    Migraine headache    Panic disorder with agoraphobia    Peripheral neuropathy    Subungual hematoma of digit of hand    Tendonitis    Vertigo     Past Surgical History: Past Surgical History:  Procedure Laterality Date   ABDOMINAL HYSTERECTOMY     APPENDECTOMY     CARDIAC CATHETERIZATION   last in 2009   x 4, normal coronary arteries   CARDIAC CATHETERIZATION N/A 03/26/2015   Procedure: Left Heart Cath and Coronary Angiography;  Surgeon: Wellington Hampshire, MD;  Location: [facility];  Service: Cardiovascular;  Laterality: N/A;   CARPAL TUNNEL RELEASE Right 08/24/2018   Procedure: CARPAL TUNNEL RELEASE;  Surgeon: Carole Civil, MD;  Location: [facility];  Service: Orthopedics;  Laterality: Right;   CESAREAN SECTION  CHOLECYSTECTOMY     COLONOSCOPY WITH PROPOFOL N/A 01/09/2018   Procedure: COLONOSCOPY WITH PROPOFOL;  Surgeon: Rogene Houston, MD;  Location: [facility];  Service: Endoscopy;  Laterality: N/A;   COLONOSCOPY WITH PROPOFOL N/A 09/21/2019   Procedure: COLONOSCOPY WITH PROPOFOL;  Surgeon: Rogene Houston, MD;  [facility];  Service: Endoscopy;  Laterality: N/A;  1055   CYST EXCISION      right breast   ESOPHAGOGASTRODUODENOSCOPY (EGD) WITH PROPOFOL N/A 01/09/2018   Procedure: ESOPHAGOGASTRODUODENOSCOPY (EGD) WITH PROPOFOL;  Surgeon: Rogene Houston, MD;  [facility];  Service: Endoscopy;  Laterality: N/A;   LEFT HEART CATH AND CORONARY ANGIOGRAPHY N/A 02/25/2020   Procedure: LEFT HEART CATH AND CORONARY ANGIOGRAPHY;  Surgeon: Troy Sine, MD;  Location: [facility];  Service: Cardiovascular;  Laterality: N/A;   POLYPECTOMY  09/21/2019   Procedure: POLYPECTOMY;  Surgeon: Rogene Houston, MD;  [facility];  Service: Endoscopy;;  ascending colon;   TOOTH EXTRACTION Bilateral 02/16/2018   Procedure: 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 RIGHT MAXILLARY SINUS ANTROSTOMY;  Surgeon: Diona Browner, DDS;  Location: [facility];  Service: Oral Surgery;  Laterality: Bilateral;   TUBAL LIGATION      Family History: Family History  Problem Relation Age of Onset   Stroke Mother 95   Heart attack Mother 26   Cancer Sister        colon   Heart failure Maternal Grandmother 52   Cancer Maternal Grandfather        prostate   Heart failure Paternal Grandmother 79   Heart failure Paternal Grandfather 60    Social History: Social History   Tobacco Use  Smoking Status Every Day   Packs/day: 0.50   Years: 11.00   Pack years: 5.50   Types: Cigarettes  Smokeless Tobacco Never  Tobacco Comments   10 ciggs per day    Social History   Substance and Sexual Activity  Alcohol Use Yes   Alcohol/week: 0.0 standard drinks   Comment: occasionally   Social History   Substance and Sexual Activity  Drug Use No    Allergies: Allergies  Allergen Reactions   Bee Venom Anaphylaxis and Hives   Zocor [Simvastatin] Other (See Comments)    Muscle aches and pains   Clindamycin/Lincomycin Rash   Codeine Rash   Penicillins Rash    Did it involve swelling of the face/tongue/throat, SOB, or low BP? No Did it involve sudden or severe rash/hives, skin  peeling, or any reaction on the inside of your mouth or nose? Yes Did you need to seek medical attention at a hospital or doctor's office? Yes When did it last happen?      30 years If all above answers are NO, may proceed with cephalosporin use.    Sulfa Antibiotics Rash         Medications: Current Outpatient Medications  Medication Sig Dispense Refill   Continuous Blood Gluc Sensor (DEXCOM G6 SENSOR) MISC Use as instructed to check sugar 4 times daily change every 10 days 9 each 3   Continuous Blood Gluc Transmit (DEXCOM G6 TRANSMITTER) MISC Use as directed change every 90 days. 1 each 3   diazepam (VALIUM) 5 MG tablet Take 5 mg by mouth 3 (three) times daily as needed for anxiety.      docusate sodium (COLACE) 100 MG capsule Take 100-200 mg by mouth See admin instructions. Take 100 mg by mouth in the  morning and 200 mg in the evening     EPIPEN 2-PAK 0.3 MG/0.3ML SOAJ injection Inject 0.3 mg into the muscle as needed for anaphylaxis.      estradiol (ESTRACE) 1 MG tablet Take 1 mg by mouth daily.     Glucagon 3 MG/DOSE POWD Place 3 mg into the nose once as needed for up to 1 dose. 1 each 11   insulin aspart (NOVOLOG) 100 UNIT/ML injection Inject up to 60 units in the pump daily 60 mL 3   Insulin Disposable Pump (OMNIPOD 5 G6 POD, GEN 5,) MISC 1 applicator by Does not apply route every 3 (three) days. 30 each 3   Insulin Human (INSULIN PUMP) SOLN Inject 1 each into the skin as directed. Humalog insulin     lisinopril (PRINIVIL,ZESTRIL) 5 MG tablet Take 5 mg by mouth daily.  2   metFORMIN (GLUCOPHAGE) 500 MG tablet Take 1 tablet (500 mg total) by mouth 2 (two) times daily with a meal. 180 tablet 3   nitroGLYCERIN (NITROSTAT) 0.4 MG SL tablet Place 1 tablet (0.4 mg total) under the tongue every 5 (five) minutes x 3 doses as needed for chest pain (if no relief after 3rd dose, proceed to the ED for an evaluation or call 911). 25 tablet 3   Omega-3 Fatty Acids (FISH OIL) 1200 MG CAPS Take  1,200 mg by mouth daily.      pravastatin (PRAVACHOL) 20 MG tablet TAKE 1 TABLET BY MOUTH AT BEDTIME. 90 tablet 0   pregabalin (LYRICA) 300 MG capsule Take 300 mg by mouth 2 (two) times daily.     promethazine (PHENERGAN) 25 MG tablet Take 1 tablet (25 mg total) by mouth every 6 (six) hours as needed for nausea or vomiting. 30 tablet 0   No current facility-administered medications for this visit.    Review of Systems: GENERAL: negative for malaise, night sweats HEENT: No changes in hearing or vision, no nose bleeds or other nasal problems. NECK: Negative for lumps, goiter, pain and significant neck swelling RESPIRATORY: Negative for cough, wheezing CARDIOVASCULAR: Negative for chest pain, leg swelling, palpitations, orthopnea GI: SEE HPI MUSCULOSKELETAL: Negative for joint pain or swelling, back pain, and muscle pain. SKIN: Negative for lesions, rash PSYCH: Negative for sleep disturbance, mood disorder and recent psychosocial stressors. HEMATOLOGY Negative for prolonged bleeding, bruising easily, and swollen nodes. ENDOCRINE: Negative for cold or heat intolerance, polyuria, polydipsia and goiter. NEURO: negative for tremor, gait imbalance, syncope and seizures. The remainder of the review of systems is noncontributory.   Physical Exam: BP 119/76 (BP Location: Left Arm, Patient Position: Sitting, Cuff Size: Large)    Pulse 99    Temp 98.2 F (36.8 C) (Oral)    Ht '5\' 7"'$  (1.702 m)    Wt 189 lb 8 oz (86 kg)    BMI 29.68 kg/m  GENERAL: The patient is AO x3, in no acute distress. HEENT: Head is normocephalic and atraumatic. EOMI are intact. Mouth is well hydrated and without lesions. NECK: Supple. No masses LUNGS: Clear to auscultation. No presence of rhonchi/wheezing/rales. Adequate chest expansion HEART: RRR, normal s1 and s2. ABDOMEN: Mildly tender upon palpation of the right lower quadrant, no guarding, no peritoneal signs, and nondistended. BS +. No masses. EXTREMITIES: Without any  cyanosis, clubbing, rash, lesions or edema. NEUROLOGIC: AOx3, no focal motor deficit. SKIN: no jaundice, no rashes  Imaging/Labs: as above  I personally reviewed and interpreted the available labs, imaging and endoscopic files.  Impression and Plan: Lisa Mustache  Mckenzie is a 51 y.o. female with Pmh  type 1 DM, depression, GERD, hyperlipidemia, IBS-C, panic disorder, peripheral neuropathy, benign paroxysmal vertigo, who presents for follow up of chronic constipation.  Patient has presented a chronic history of constipation which has worsened recently as she has presented more bloating and abdominal discomfort.  She has not presented any red flag signs but unfortunately has failed multiple over-the-counter laxatives and motility agents.  In fact, she has tried Linzess at 290 mcg dosage without any significant improvement.  Due to this, I am concerned that she may have an anorectal disorder that will need to be evaluated with anorectal manometry.  The patient understood and agreed.  For now, I will give her a bowel prep that she needs to take in the next 1 to 2 days, which she should follow with the intake of Trulance 3 mg daily (we will give her samples today).  She may consider adding MiraLAX to this regimen if she is still not achieving more regular bowel movement frequency.  We will also check celiac serology today.  - Referral for anorectal manometry -Bowel prep sent to pharmacy - Start Trulance 3 mg qday (will provide samples today) - If still having constipation on Trulance, can start taking Miralax daily, up to 3 capfuls daily -Check celiac serology today  All questions were answered.      Harvel Quale, MD Gastroenterology and Hepatology [facility]

## 2021-10-08 NOTE — Patient Instructions (Addendum)
Referral for anorectal manometry ?Start Trulance 3 mg qday (will provide samples today) ?If still having constipation on Trulance, can start taking Miralax daily, up to 3 capfuls daily ?Perform blood workup ?

## 2021-10-09 LAB — CELIAC DISEASE PANEL
(tTG) Ab, IgA: <1 U/mL
(tTG) Ab, IgG: <1 U/mL
Gliadin IgA: <1 U/mL
Gliadin IgG: <1 U/mL
Immunoglobulin A: 124 mg/dL (ref 47–310)

## 2021-10-12 ENCOUNTER — Other Ambulatory Visit (INDEPENDENT_AMBULATORY_CARE_PROVIDER_SITE_OTHER): Payer: Self-pay | Admitting: Gastroenterology

## 2021-10-12 DIAGNOSIS — K581 Irritable bowel syndrome with constipation: Secondary | ICD-10-CM

## 2021-10-12 MED ORDER — LUBIPROSTONE 24 MCG PO CAPS: 24.0000 ug | ORAL_CAPSULE | Freq: Two times a day (BID) | ORAL | 3 refills | Status: DC

## 2021-10-20 ENCOUNTER — Telehealth: Payer: Self-pay | Admitting: Nutrition

## 2021-10-20 NOTE — Telephone Encounter (Signed)
Patient reports that she is bolusing for all carbs eaten at lunch.  Her I/C ratio was changed from 11 to 10 at lunch tim.  She was also asked if she boluses for her snacking at night and she said not always.  She was reminded that she needs to do this with all snacking unless it is to treat a low.  She agreed to do this.    She had no questions for me at this time. ?

## 2021-11-12 ENCOUNTER — Institutional Professional Consult (permissible substitution): Payer: Medicare Other | Admitting: Pulmonary Disease

## 2021-11-17 ENCOUNTER — Telehealth: Payer: Self-pay | Admitting: Nutrition

## 2021-11-17 NOTE — Telephone Encounter (Signed)
Message left on my phone yesterday at 3:30PM that she is having trouble with her supplies again, and would like the Dexcoms called into [facility] ASAP. ?

## 2021-11-17 NOTE — Telephone Encounter (Signed)
Another message left by [name] that she also needs Omnipods and Dexcom sensors sent to [facility] in [place].  Phone number is [phone] ?11/18/21.  Pt reports that the Pharmacy has not gotten the prescription for the pods or Dexcom.  Please resend.  This is her last day for her last pod ?

## 2021-11-19 ENCOUNTER — Encounter: Payer: Self-pay | Admitting: Internal Medicine

## 2021-11-19 DIAGNOSIS — E1065 Type 1 diabetes mellitus with hyperglycemia: Secondary | ICD-10-CM

## 2021-11-20 ENCOUNTER — Other Ambulatory Visit: Payer: Self-pay

## 2021-11-20 DIAGNOSIS — E1065 Type 1 diabetes mellitus with hyperglycemia: Secondary | ICD-10-CM

## 2021-11-20 MED ORDER — DEXCOM G6 SENSOR MISC: 3 refills | Status: DC

## 2021-11-20 MED ORDER — OMNIPOD 5 DEXG7G6 PODS GEN 5 MISC: 1.0000 | 3 refills | Status: DC

## 2021-11-20 NOTE — Telephone Encounter (Signed)
Rx sent to preferred pharmacy.

## 2021-11-23 ENCOUNTER — Ambulatory Visit (INDEPENDENT_AMBULATORY_CARE_PROVIDER_SITE_OTHER): Payer: Medicare Other | Admitting: Gastroenterology

## 2021-11-25 ENCOUNTER — Other Ambulatory Visit: Payer: Self-pay | Admitting: Physician Assistant

## 2021-11-29 ENCOUNTER — Other Ambulatory Visit: Payer: Self-pay | Admitting: Internal Medicine

## 2021-12-15 ENCOUNTER — Telehealth: Payer: Self-pay | Admitting: Nutrition

## 2021-12-15 NOTE — Telephone Encounter (Signed)
Left message on my machine to please call in a script for Dexcom G6 transmitter to [facility] in [place].   ?

## 2021-12-17 ENCOUNTER — Other Ambulatory Visit: Payer: Self-pay

## 2021-12-17 DIAGNOSIS — E1042 Type 1 diabetes mellitus with diabetic polyneuropathy: Secondary | ICD-10-CM

## 2021-12-17 MED ORDER — DEXCOM G6 TRANSMITTER MISC: 3 refills | Status: DC

## 2021-12-18 NOTE — Telephone Encounter (Signed)
Rx sent to preferred pharmacy.

## 2021-12-29 ENCOUNTER — Encounter: Payer: Self-pay | Admitting: Internal Medicine

## 2021-12-29 DIAGNOSIS — E1042 Type 1 diabetes mellitus with diabetic polyneuropathy: Secondary | ICD-10-CM

## 2021-12-31 ENCOUNTER — Encounter: Payer: Self-pay | Admitting: Internal Medicine

## 2021-12-31 MED ORDER — FREESTYLE LIBRE 2 SENSOR MISC: 1.0000 | 3 refills | Status: DC

## 2021-12-31 MED ORDER — FREESTYLE LIBRE 2 READER DEVI: 0 refills | Status: DC

## 2021-12-31 NOTE — Telephone Encounter (Signed)
Rx sent.  Pt notified. 

## 2022-01-04 ENCOUNTER — Ambulatory Visit: Payer: Medicare Other | Admitting: Internal Medicine

## 2022-01-06 ENCOUNTER — Ambulatory Visit (INDEPENDENT_AMBULATORY_CARE_PROVIDER_SITE_OTHER): Payer: Medicare Other | Admitting: Podiatry

## 2022-01-06 ENCOUNTER — Ambulatory Visit (INDEPENDENT_AMBULATORY_CARE_PROVIDER_SITE_OTHER): Payer: Medicare Other

## 2022-01-06 DIAGNOSIS — M7751 Other enthesopathy of right foot: Secondary | ICD-10-CM

## 2022-01-06 NOTE — Progress Notes (Signed)
   HPI: 51 y.o. female presenting today for follow-up evaluation of continued right foot pain.  Patient states the foot pain has slowly increased over the last few months.  She was last seen in the office on 08/31/2021.  She says that despite conservative treatments pain has only increased.  She presents for further treatment and evaluation  Past Medical History:  Diagnosis Date   Benign paroxysmal vertigo    Cervicalgia    Depression    Diabetes mellitus ([facility])    GERD (gastroesophageal reflux disease)    Hiatal hernia    History of cardiac catheterization    Normal coronary arteries 2016 and 2021   History of TIA (transient ischemic attack)    Hyperlipidemia    Hypertension    IBS (irritable bowel syndrome)    Migraine headache    Panic disorder with agoraphobia    Peripheral neuropathy    Subungual hematoma of digit of hand    Tendonitis    Vertigo      Physical Exam: General: The patient is alert and oriented x3 in no acute distress.  Dermatology: Skin is warm, dry and supple bilateral lower extremities. Negative for open lesions or macerations.  Vascular: Palpable pedal pulses bilaterally. No edema or erythema noted. Capillary refill within normal limits.  Neurological: Epicritic and protective threshold grossly intact bilaterally.   Musculoskeletal Exam: Range of motion within normal limits to all pedal and ankle joints bilateral. Muscle strength 5/5 in all groups bilateral.  Tenderness to palpation to the right second digit.  Overall there is good alignment of the toes there is some limited range of motion of the first MTP joint of the right foot specifically with plantar flexion. There is also some weakness with dorsiflexion and eversion of the foot against resistance.  Radiographic exam RT foot: Normal osseous mineralization.  No acute fractures or erosions identified.  Absence of the second metatarsal head noted from prior surgery.  No findings that would correlate with  the patient's current symptoms   Assessment: 1.  History of bilateral foot surgery 2.  Pain with weakness right foot and ankle  Plan of Care:  1. Patient evaluated. X-Rays reviewed again today and compared to prior x-rays taken.  2.  Patient states that today she does have some pain throughout the midtarsal joint and this has been ongoing for several months now.  Conservative treatments have been unsuccessful.  I do believe it is appropriate at this time to order an MRI to further evaluate the midtarsal joints of the foot 3.  MRI ordered right foot without contrast at [facility] 4.  In the meantime continue wearing good supportive shoes and sneakers 5.  Continue meloxicam 15 mg daily as needed 6.  Return to clinic after MRI to review results and discuss further treatment options  Edrick Kins, DPM [facility]  Dr. Edrick Kins, DPM    [address], [place] [address]                Office [phone]  Fax [phone]

## 2022-01-12 ENCOUNTER — Institutional Professional Consult (permissible substitution): Payer: Medicare Other | Admitting: Pulmonary Disease

## 2022-01-16 ENCOUNTER — Ambulatory Visit
Admission: RE | Admit: 2022-01-16 | Discharge: 2022-01-16 | Disposition: A | Discharge status: Home / Self Care | Payer: Medicare Other | Source: Ambulatory Visit | Attending: Podiatry | Admitting: Podiatry

## 2022-01-16 DIAGNOSIS — M7751 Other enthesopathy of right foot: Secondary | ICD-10-CM

## 2022-01-25 ENCOUNTER — Ambulatory Visit (INDEPENDENT_AMBULATORY_CARE_PROVIDER_SITE_OTHER): Payer: Medicare Other | Admitting: Podiatry

## 2022-01-25 DIAGNOSIS — M7751 Other enthesopathy of right foot: Secondary | ICD-10-CM | POA: Diagnosis not present

## 2022-02-08 ENCOUNTER — Encounter (INDEPENDENT_AMBULATORY_CARE_PROVIDER_SITE_OTHER): Payer: Self-pay

## 2022-02-08 ENCOUNTER — Ambulatory Visit (INDEPENDENT_AMBULATORY_CARE_PROVIDER_SITE_OTHER): Payer: Medicare Other | Admitting: Gastroenterology

## 2022-02-09 ENCOUNTER — Telehealth (INDEPENDENT_AMBULATORY_CARE_PROVIDER_SITE_OTHER): Payer: Self-pay | Admitting: *Deleted

## 2022-02-10 ENCOUNTER — Telehealth (INDEPENDENT_AMBULATORY_CARE_PROVIDER_SITE_OTHER): Payer: Self-pay | Admitting: Gastroenterology

## 2022-02-10 NOTE — Telephone Encounter (Signed)
I called the patient today to discuss the results of the most recent endorectal manometry performed on 02/01/2022 which showed presence of normal resting pressure in the internal sphincter, the external anal sphincter was able to generate but unable to maintain squeeze duration.  The patient was unable to expel the 50 cc balloon during the expulsion testing.  Unfortunately, the patient did not pick up my call and I left a detailed voice message explaining the findings.  If she has not presented any improvement with the use of Trulance and MiraLAX, we will need to refer her for MR defecography at [facility].

## 2022-02-11 NOTE — Telephone Encounter (Signed)
Patient made aware of all. She states she is no better with the Trulance and the Miralax and wants to be referred out for the MR defecography.

## 2022-02-15 NOTE — Telephone Encounter (Signed)
Dx: pelvic floor dysfunction

## 2022-02-15 NOTE — Telephone Encounter (Signed)
What is diagnosis for MR defecography

## 2022-02-15 NOTE — Telephone Encounter (Signed)
Referral/order have been faxed to [facility], they will contact patient to schedule

## 2022-02-16 NOTE — Telephone Encounter (Signed)
error 

## 2022-02-17 ENCOUNTER — Telehealth: Payer: Self-pay

## 2022-02-17 NOTE — Telephone Encounter (Signed)
Agree, it is extremely unlikely the symptoms are related to the manometry. She should follow with her PCP regarding why she has presented these new onset symptoms as well as following the urine culture.

## 2022-02-17 NOTE — Telephone Encounter (Signed)
Patient aware of all.

## 2022-02-17 NOTE — Telephone Encounter (Signed)
Patient called today saying she had a recent anorectal manometry on 02/01/2022. She reports to have had a sick feeling two days after. She says she has continued to feel nauseous and had a decreased appetite, hot flashes, then chills, but no fever that she knows of. She denies any vomiting or abdominal pain nor does she have rectal pain. She says she spoke with PCP Dr. Morene Rankins at [facility] today and they told her she had some elevated WBC's that could be related to inflammation or any other reason. They advised the patient that they did not think this was related to the Anorectal manometry, but she could call our office and ask for advise regarding her symptoms. Patient says she had wondered if the procedure at [facility] was contaminated and caused her to feel this way.  I called [facility] and spoke with the nurse there Jaclyn Shaggy she says their note said that they thought unlikely that the anorectal manometry causing symptoms,but to call our office for recommendations. They instructed her to take phenergan and to eat the BRAT diet. I asked Jaclyn Shaggy if they had ran a urine culture on the patient and she said yes it is still pending. I asked that she please fax the labs with the WBC elevated to [place]. Please advise.

## 2022-02-21 ENCOUNTER — Other Ambulatory Visit: Payer: Self-pay | Admitting: Internal Medicine

## 2022-02-22 ENCOUNTER — Other Ambulatory Visit: Payer: Self-pay | Admitting: Internal Medicine

## 2022-03-05 ENCOUNTER — Telehealth: Payer: Self-pay

## 2022-03-05 NOTE — Telephone Encounter (Signed)
Pt lvm advising she misplaced her Omnipod receiver and needed assistance.

## 2022-03-05 NOTE — Telephone Encounter (Signed)
Lvm for pt advising she may need to contact [facility] customer service at [phone] for urgent assistance.

## 2022-03-09 ENCOUNTER — Encounter: Payer: Self-pay | Admitting: Internal Medicine

## 2022-03-10 ENCOUNTER — Other Ambulatory Visit: Payer: Self-pay

## 2022-03-10 DIAGNOSIS — E1065 Type 1 diabetes mellitus with hyperglycemia: Secondary | ICD-10-CM

## 2022-03-10 MED ORDER — DEXCOM G6 TRANSMITTER MISC: 3 refills | Status: DC

## 2022-03-15 ENCOUNTER — Ambulatory Visit: Payer: Medicare Other | Admitting: Podiatry

## 2022-03-16 ENCOUNTER — Encounter (HOSPITAL_COMMUNITY): Payer: Self-pay

## 2022-03-16 ENCOUNTER — Other Ambulatory Visit: Payer: Self-pay

## 2022-03-16 ENCOUNTER — Emergency Department (HOSPITAL_COMMUNITY)
Admission: EM | Admit: 2022-03-16 | Discharge: 2022-03-16 | Discharge status: Against Medical Advice | Payer: Medicare Other | Attending: Emergency Medicine | Admitting: Emergency Medicine

## 2022-03-16 DIAGNOSIS — R110 Nausea: Secondary | ICD-10-CM | POA: Insufficient documentation

## 2022-03-16 DIAGNOSIS — R0602 Shortness of breath: Secondary | ICD-10-CM | POA: Diagnosis not present

## 2022-03-16 DIAGNOSIS — R42 Dizziness and giddiness: Secondary | ICD-10-CM | POA: Diagnosis not present

## 2022-03-16 DIAGNOSIS — Z5321 Procedure and treatment not carried out due to patient leaving prior to being seen by health care provider: Secondary | ICD-10-CM | POA: Diagnosis not present

## 2022-03-16 LAB — CBC
HCT: 42.8 % (ref 36.0–46.0)
Hemoglobin: 14.9 g/dL (ref 12.0–15.0)
MCH: 30.6 pg (ref 26.0–34.0)
MCHC: 34.8 g/dL (ref 30.0–36.0)
MCV: 87.9 fL (ref 80.0–100.0)
Platelets: 218 10*3/uL (ref 150–400)
RBC: 4.87 MIL/uL (ref 3.87–5.11)
RDW: 13.4 % (ref 11.5–15.5)
WBC: 10.8 10*3/uL — ABNORMAL HIGH (ref 4.0–10.5)
nRBC: 0 % (ref 0.0–0.2)

## 2022-03-16 LAB — BASIC METABOLIC PANEL
Anion gap: 9 (ref 5–15)
BUN: 9 mg/dL (ref 6–20)
CO2: 22 mmol/L (ref 22–32)
Calcium: 9.2 mg/dL (ref 8.9–10.3)
Chloride: 106 mmol/L (ref 98–111)
Creatinine, Ser: 0.84 mg/dL (ref 0.44–1.00)
GFR, Estimated: >60 mL/min (ref >60)
Glucose, Bld: 192 mg/dL — ABNORMAL HIGH (ref 70–99)
Potassium: 4.1 mmol/L (ref 3.5–5.1)
Sodium: 137 mmol/L (ref 135–145)

## 2022-03-16 LAB — CBG MONITORING, ED: Glucose-Capillary: 199 mg/dL — ABNORMAL HIGH (ref 70–99)

## 2022-03-16 NOTE — ED Triage Notes (Signed)
Patient reports nausea with dizziness and episodes of sweating for the past two weeks. States that when she ambulates she gets [facility] and dizziness is worse.

## 2022-03-25 ENCOUNTER — Ambulatory Visit: Payer: Medicare Other | Admitting: Internal Medicine

## 2022-04-01 NOTE — Progress Notes (Signed)
[facility], [place] [address]   CLINIC:  Medical Oncology/Hematology  CONSULT NOTE  Patient Care Team: Denny Levy, [place] as PCP - General (Family Medicine) Satira Sark, MD as PCP - Cardiology (Cardiology) Denny Levy, [place] (Family Medicine)  CHIEF COMPLAINTS/PURPOSE OF CONSULTATION:  Erythrocytosis  HISTORY OF PRESENTING ILLNESS:  [name] 51 y.o. female is here at the request of Denny Levy, PA-C for evaluation of erythrocytosis.  Per PCP note from 03/18/2022, patient has been having persistent fatigue and is very concerned about this.  Her CBC (03/09/2022) was noted to have some mild erythrocytosis with Hgb 16.2 and HCT 46.2%, therefore she is referred to hematology.  CBC obtained during ED visit on 03/16/2022 showed normal Hgb 14.9 and normal HCT 42.8.  Patient also expresses concern regarding family history of factor V mutation in one of her cousins.  She has history of TIA several years ago, but denies any history of DVT or PE.  Patient's sister had blood clots in her legs and lungs.  Regarding her erythrocytosis, patient has been smoking 0.5 PPD cigarettes for the past 10 years.  She is also been referred for sleep medicine consult with Dr. Merlene Laughter later this month.  She denies any history of cardiopulmonary disease, car monoxide exposure, or diuretics.  She does not have any family history of MPN.  Patient reports significant fatigue for the past month associated with increased headaches, nausea, occasional aquagenic pruritus, and mild intermittent erythromelalgia.  She also reports lightheadedness.  She has chronic diabetic neuropathy.  She denies any Raynaud's phenomenon, tinnitus, blurry vision, or strokelike symptoms.  She reports drenching night sweats for the past month, sometimes associated with non-shaking chills, other times associated with feeling hot and flushed.  She has already gone through menopause about 2  years ago.  She denies any fevers or shaking chills.  She denies any new lumps or bumps.  She reports that she has been losing weight over the past 4 months.  Per our records, her weight 1 year ago (02/16/2021) was 176 pounds, but she had increased to 192 pounds (09/14/2021) and is now down to 176 pounds (04/02/2022).  She reports poor appetite but denies any early satiety or abdominal pain.  Patient is up-to-date on her colonoscopy, most recently in February 2021, which showed polyp (sessile serrated polyp negative for high-grade dysplasia or malignancy) and hemorrhoids. She is not up-to-date on mammograms, with last mammogram done "several years ago," records not available in EMR.  Her past medical history significant for anxiety with panic disorder, depression, type 2 diabetes mellitus, GERD, TIA, hyperlipidemia, and hypertension.  She is on disability secondary to her diabetic neuropathy.  She lives at home alone.  She smokes 0.5 PPD cigarettes.  She will rarely drink alcohol.  She denies any illicit drug use.  Family history significant for sister deceased at age 83 from colon cancer.  Maternal grandfather had prostate cancer.  Paternal grandmother had pancreatic cancer.  Niece passed away at age 44 from brain tumor.   MEDICAL HISTORY:  Past Medical History:  Diagnosis Date   Benign paroxysmal vertigo    Cervicalgia    Depression    Diabetes mellitus (HCC)    GERD (gastroesophageal reflux disease)    Hiatal hernia    History of cardiac catheterization    Normal coronary arteries 2016 and 2021   History of TIA (transient ischemic attack)    Hyperlipidemia    Hypertension  IBS (irritable bowel syndrome)    Migraine headache    Panic disorder with agoraphobia    Peripheral neuropathy    Subungual hematoma of digit of hand    Tendonitis    Vertigo     SURGICAL HISTORY: Past Surgical History:  Procedure Laterality Date   ABDOMINAL HYSTERECTOMY     APPENDECTOMY     CARDIAC  CATHETERIZATION   last in 2009   x 4, normal coronary arteries   CARDIAC CATHETERIZATION N/A 03/26/2015   Procedure: Left Heart Cath and Coronary Angiography;  Surgeon: Wellington Hampshire, MD;  Location: [facility];  Service: Cardiovascular;  Laterality: N/A;   CARPAL TUNNEL RELEASE Right 08/24/2018   Procedure: CARPAL TUNNEL RELEASE;  Surgeon: Carole Civil, MD;  Location: [facility];  Service: Orthopedics;  Laterality: Right;   CESAREAN SECTION     CHOLECYSTECTOMY     COLONOSCOPY WITH PROPOFOL N/A 01/09/2018   Procedure: COLONOSCOPY WITH PROPOFOL;  Surgeon: Rogene Houston, MD;  [facility]: [facility];  Service: Endoscopy;  Laterality: N/A;   COLONOSCOPY WITH PROPOFOL N/A 09/21/2019   Procedure: COLONOSCOPY WITH PROPOFOL;  Surgeon: Rogene Houston, MD;  [facility];  Service: Endoscopy;  Laterality: N/A;  1055   CYST EXCISION     right breast   ESOPHAGOGASTRODUODENOSCOPY (EGD) WITH PROPOFOL N/A 01/09/2018   Procedure: ESOPHAGOGASTRODUODENOSCOPY (EGD) WITH PROPOFOL;  Surgeon: Rogene Houston, MD;  [facility];  Service: Endoscopy;  Laterality: N/A;   LEFT HEART CATH AND CORONARY ANGIOGRAPHY N/A 02/25/2020   Procedure: LEFT HEART CATH AND CORONARY ANGIOGRAPHY;  Surgeon: Troy Sine, MD;  Location: [facility];  Service: Cardiovascular;  Laterality: N/A;   POLYPECTOMY  09/21/2019   Procedure: POLYPECTOMY;  Surgeon: Rogene Houston, MD;  [facility];  Service: Endoscopy;;  ascending colon;   TOOTH EXTRACTION Bilateral 02/16/2018   Procedure: 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 RIGHT MAXILLARY SINUS ANTROSTOMY;  Surgeon: Diona Browner, DDS;  Location: [facility];  Service: Oral Surgery;  Laterality: Bilateral;   TUBAL LIGATION      SOCIAL HISTORY: Social History   Socioeconomic History   Marital status: Legally Separated    Spouse name: Not on file   Number of children: Not on file   Years of education: Not on file   Highest  education level: Not on file  Occupational History   Not on file  Tobacco Use   Smoking status: Every Day    Packs/day: 0.50    Years: 11.00    Total pack years: 5.50    Types: Cigarettes   Smokeless tobacco: Never   Tobacco comments:    10 ciggs per day   Vaping Use   Vaping Use: Never used  Substance and Sexual Activity   Alcohol use: Yes    Alcohol/week: 0.0 standard drinks of alcohol    Comment: occasionally   Drug use: No   Sexual activity: Not on file  Other Topics Concern   Not on file  Social History Narrative   Right handed   Caffeine~4-5 cups per day (diet sun drop)   Lives at home alone    Social Determinants of Health   Financial Resource Strain: Not on file  Food Insecurity: Not on file  Transportation Needs: Not on file  Physical Activity: Not on file  Stress: Not on file  Social Connections: Not on file  Intimate Partner Violence: Not on file  FAMILY HISTORY: Family History  Problem Relation Age of Onset   Stroke Mother 26   Heart attack Mother 24   Cancer Sister        colon   Heart failure Maternal Grandmother 58   Cancer Maternal Grandfather        prostate   Heart failure Paternal Grandmother 80   Heart failure Paternal Grandfather 16    ALLERGIES:  is allergic to bee venom, zocor [simvastatin], clindamycin/lincomycin, codeine, penicillins, and sulfa antibiotics.  MEDICATIONS:  Current Outpatient Medications  Medication Sig Dispense Refill   Continuous Blood Gluc Receiver (FREESTYLE LIBRE 2 READER) DEVI Use as instructed to check blood sugar. 1 each 0   Continuous Blood Gluc Sensor (DEXCOM G6 SENSOR) MISC Use as instructed to check sugar 4 times daily change every 10 days 9 each 3   Continuous Blood Gluc Sensor (FREESTYLE LIBRE 2 SENSOR) MISC 1 applicator by Does not apply route every 14 (fourteen) days. 6 each 3   Continuous Blood Gluc Transmit (DEXCOM G6 TRANSMITTER) MISC Use as directed change every 90 days. 1 each 3   diazepam  (VALIUM) 5 MG tablet Take 5 mg by mouth 3 (three) times daily as needed for anxiety.      docusate sodium (COLACE) 100 MG capsule Take 100-200 mg by mouth See admin instructions. Take 100 mg by mouth in the morning and 200 mg in the evening     EPIPEN 2-PAK 0.3 MG/0.3ML SOAJ injection Inject 0.3 mg into the muscle as needed for anaphylaxis.      estradiol (ESTRACE) 1 MG tablet Take 1 mg by mouth daily.     Glucagon 3 MG/DOSE POWD Place 3 mg into the nose once as needed for up to 1 dose. 1 each 11   Insulin Aspart FlexPen (NOVOLOG) 100 UNIT/ML INJECT UP TO 60 UNITS IN THE PUMP DAILY. 15 mL 0   Insulin Disposable Pump (OMNIPOD 5 G6 POD, GEN 5,) MISC 1 applicator by Does not apply route every 3 (three) days. 30 each 3   Insulin Human (INSULIN PUMP) SOLN Inject 1 each into the skin as directed. Humalog insulin     lisinopril (PRINIVIL,ZESTRIL) 5 MG tablet Take 5 mg by mouth daily.  2   lubiprostone (AMITIZA) 24 MCG capsule Take 1 capsule (24 mcg total) by mouth 2 (two) times daily with a meal. 60 capsule 3   metFORMIN (GLUCOPHAGE) 500 MG tablet Take 1 tablet (500 mg total) by mouth 2 (two) times daily with a meal. 180 tablet 3   nitroGLYCERIN (NITROSTAT) 0.4 MG SL tablet Place 1 tablet (0.4 mg total) under the tongue every 5 (five) minutes x 3 doses as needed for chest pain (if no relief after 3rd dose, proceed to the ED for an evaluation or call 911). 25 tablet 3   Omega-3 Fatty Acids (FISH OIL) 1200 MG CAPS Take 1,200 mg by mouth daily.      pravastatin (PRAVACHOL) 20 MG tablet TAKE 1 TABLET BY MOUTH AT BEDTIME. 30 tablet 0   pregabalin (LYRICA) 300 MG capsule Take 300 mg by mouth 2 (two) times daily.     promethazine (PHENERGAN) 25 MG tablet Take 1 tablet (25 mg total) by mouth every 6 (six) hours as needed for nausea or vomiting. 30 tablet 0   No current facility-administered medications for this visit.    REVIEW OF SYSTEMS:     Review of Systems  Constitutional:  Positive for appetite change,  diaphoresis and fatigue. Negative for chills, fever  and unexpected weight change.  HENT:   Negative for lump/mass and nosebleeds.   Eyes:  Negative for eye problems.  Respiratory:  Positive for cough and shortness of breath (With exertion). Negative for hemoptysis.   Cardiovascular:  Positive for palpitations. Negative for chest pain and leg swelling.  Gastrointestinal:  Positive for nausea. Negative for abdominal pain, blood in stool, constipation, diarrhea and vomiting.  Endocrine: Positive for hot flashes.  Genitourinary:  Negative for hematuria.   Skin: Negative.   Neurological:  Positive for dizziness and headaches. Negative for light-headedness.  Hematological:  Does not bruise/bleed easily.  Psychiatric/Behavioral:  Positive for sleep disturbance. The patient is nervous/anxious.       PHYSICAL EXAMINATION:   ECOG PERFORMANCE STATUS: 1 - Symptomatic but completely ambulatory  There were no vitals filed for this visit. There were no vitals filed for this visit.  Physical Exam Constitutional:      Appearance: Normal appearance. She is obese.  HENT:     Head: Normocephalic and atraumatic.     Mouth/Throat:     Mouth: Mucous membranes are moist.  Eyes:     Extraocular Movements: Extraocular movements intact.     Pupils: Pupils are equal, round, and reactive to light.  Cardiovascular:     Rate and Rhythm: Normal rate and regular rhythm.     Pulses: Normal pulses.     Heart sounds: Normal heart sounds.  Pulmonary:     Effort: Pulmonary effort is normal.     Breath sounds: Normal breath sounds.  Abdominal:     General: Bowel sounds are normal.     Palpations: Abdomen is soft.     Tenderness: There is no abdominal tenderness.  Musculoskeletal:        General: No swelling.     Right lower leg: No edema.     Left lower leg: No edema.  Lymphadenopathy:     Cervical: No cervical adenopathy.  Skin:    General: Skin is warm and dry.  Neurological:     General: No focal  deficit present.     Mental Status: She is alert and oriented to person, place, and time.  Psychiatric:        Mood and Affect: Mood normal.        Behavior: Behavior normal.       LABORATORY DATA:  I have reviewed the data as listed Recent Results (from the past 2160 hour(s))  CBG monitoring, ED     Status: Abnormal   Collection Time: 03/16/22  3:34 PM  Result Value Ref Range   Glucose-Capillary 199 (H) 70 - 99 mg/dL    Comment: Glucose reference range applies only to samples taken after fasting for at least 8 hours.  Basic metabolic panel     Status: Abnormal   Collection Time: 03/16/22  3:39 PM  Result Value Ref Range   Sodium 137 135 - 145 mmol/L   Potassium 4.1 3.5 - 5.1 mmol/L   Chloride 106 98 - 111 mmol/L   CO2 22 22 - 32 mmol/L   Glucose, Bld 192 (H) 70 - 99 mg/dL    Comment: Glucose reference range applies only to samples taken after fasting for at least 8 hours.   BUN 9 6 - 20 mg/dL   Creatinine, Ser 0.84 0.44 - 1.00 mg/dL   Calcium 9.2 8.9 - 10.3 mg/dL   GFR, Estimated >60 >60 mL/min    Comment: (NOTE) Calculated using the CKD-EPI Creatinine Equation (2021)    Anion  gap 9 5 - 15    Comment: Performed at [facility], [address] [place], [place] [address]  CBC     Status: Abnormal   Collection Time: 03/16/22  3:39 PM  Result Value Ref Range   WBC 10.8 (H) 4.0 - 10.5 K/uL   RBC 4.87 3.87 - 5.11 MIL/uL   Hemoglobin 14.9 12.0 - 15.0 g/dL   HCT 42.8 36.0 - 46.0 %   MCV 87.9 80.0 - 100.0 fL   MCH 30.6 26.0 - 34.0 pg   MCHC 34.8 30.0 - 36.0 g/dL   RDW 13.4 11.5 - 15.5 %   Platelets 218 150 - 400 K/uL   nRBC 0.0 0.0 - 0.2 %    Comment: Performed at [facility], [address], [place], [place] [address]    RADIOGRAPHIC STUDIES: I have personally reviewed the radiological images as listed and agreed with the findings in the report. No results found.  ASSESSMENT & PLAN: 1.  Erythrocytosis, mild - Seen at the request of Denny Levy, PA-C for evaluation  of erythrocytosis. - Per PCP note from 03/18/2022, patient has been having persistent fatigue and is very concerned about this.  Her CBC (03/09/2022) was noted to have some mild erythrocytosis with Hgb 16.2 and HCT 46.2%, therefore she is referred to hematology. - CBC obtained during ED visit on 03/16/2022 showed normal Hgb 14.9 and normal HCT 42.8. - Smokes 0.5 PPD x10 years - Referred for sleep medicine consult with Dr. Merlene Laughter later this month - No history of cardiopulmonary disease, carbon monoxide exposure, or diuretics - No family history of MPN - Reports fatigue, aquagenic pruritus, and mild intermittent erythromelalgia for the past month - PLAN: We will check CBC, erythropoietin, and carbon monoxide.  No indication for MPN panel at this point in time.  2.  Family history of factor V Leiden - Patient is very concerned regarding family history of factor V mutation in one cousin - She has history of TIA, but denies any history of DVT or PE. - Patient's sister had DVT and PE, unknown if provoked or unprovoked - PLAN: Per patient request, we will check factor V Leiden.  Patient reports significant fatigue for the past month associated with increased headaches, nausea, occasional aquagenic pruritus, and mild intermittent erythromelalgia.  She also reports lightheadedness.  She has chronic diabetic neuropathy.  She denies any Raynaud's phenomenon, tinnitus, blurry vision, or strokelike symptoms.  She reports drenching night sweats for the past month, sometimes associated with non-shaking chills, other times associated with feeling hot and flushed.  She has already gone through menopause about 2 years ago.  She denies any fevers or shaking chills.  She denies any new lumps or bumps.  She reports that she has been losing weight over the past 4 months.  Per our records, her weight 1 year ago (02/16/2021) was 176 pounds, but she had increased to 192 pounds (09/14/2021) and is now down to 176 pounds  (04/02/2022).  She reports poor appetite but denies any early satiety or abdominal pain.  3.  Fatigue and other symptoms - Patient reports feeling poorly for the past month with fatigue, headaches, nausea, poor appetite, hot flashes with night sweats, and weight loss. - No lymphadenopathy or hepatosplenomegaly palpated on exam - PLAN: Recommend continued follow-up with PCP for the symptoms described in HPI.  4.  Other history - Up-to-date on colonoscopy, most recently in February 2021, which showed polyp (sessile serrated polyp negative for high-grade dysplasia or malignancy) and  hemorrhoids. - She is not up-to-date on mammograms, with last mammogram done "several years ago," records not available in EMR. - PMH: Anxiety with panic disorder, depression, type 2 diabetes mellitus, GERD, TIA, hyperlipidemia, and hypertension. - SOCIAL: She is on disability secondary to her diabetic neuropathy.  She lives at home alone.  She smokes 0.5 PPD cigarettes.  She will rarely drink alcohol.  She denies any illicit drug use. - FAMILY: Family history significant for sister deceased at age 80 from colon cancer.  Maternal grandfather had prostate cancer.  Paternal grandmother had pancreatic cancer.  Niece passed away at age 74 from brain tumor. - PLAN: We will order screening mammogram and recommend continued annual mammograms via PCP.    PLAN SUMMARY & DISPOSITION: Labs today Screening mammogram Office visit in 4 weeks to discuss results  All questions were answered. The patient knows to call the clinic with any problems, questions or concerns.   Medical decision making: Moderate  Time spent on visit: I spent 30 minutes counseling the patient face to face. The total time spent in the appointment was 45 minutes and more than 50% was on counseling.  I, Tarri Abernethy PA-C, have seen this patient in conjunction with Dr. Derek Jack.  Greater than 50% of visit was performed by Dr. Delton Coombes.    Harriett Rush, PA-C 04/02/22 12:47 PM  DR. Anadalay Macdonell: I have independently evaluated this patient and formulated my assessment and plan.  I agree with HPI, assessment and plan written by Casey Burkitt, PA-C.  Patient evaluated for mild erythrocytosis.  She smokes half pack per day for past 10 years.  The mildly elevated hemoglobin and hematocrit could be a one-time finding as her previous labs over several years showed normal values.  Hence I would not recommend doing work-up for myeloproliferative neoplasms (polycythemia vera).  RTC 4 weeks to discuss results.

## 2022-04-02 ENCOUNTER — Encounter: Payer: Self-pay | Admitting: Hematology

## 2022-04-02 ENCOUNTER — Inpatient Hospital Stay: Payer: Medicare Other | Attending: Hematology | Admitting: Hematology

## 2022-04-02 ENCOUNTER — Inpatient Hospital Stay: Payer: Medicare Other

## 2022-04-02 VITALS — BP 115/75 | HR 85 | Temp 97.2°F | Resp 18 | Ht 67.0 in | Wt 176.4 lb

## 2022-04-02 DIAGNOSIS — E1140 Type 2 diabetes mellitus with diabetic neuropathy, unspecified: Secondary | ICD-10-CM

## 2022-04-02 DIAGNOSIS — Z800 Family history of malignant neoplasm of digestive organs: Secondary | ICD-10-CM

## 2022-04-02 DIAGNOSIS — D751 Secondary polycythemia: Secondary | ICD-10-CM

## 2022-04-02 DIAGNOSIS — Z832 Family history of diseases of the blood and blood-forming organs and certain disorders involving the immune mechanism: Secondary | ICD-10-CM | POA: Diagnosis not present

## 2022-04-02 DIAGNOSIS — R634 Abnormal weight loss: Secondary | ICD-10-CM | POA: Diagnosis not present

## 2022-04-02 DIAGNOSIS — F17210 Nicotine dependence, cigarettes, uncomplicated: Secondary | ICD-10-CM | POA: Diagnosis not present

## 2022-04-02 DIAGNOSIS — E785 Hyperlipidemia, unspecified: Secondary | ICD-10-CM | POA: Insufficient documentation

## 2022-04-02 DIAGNOSIS — Z8042 Family history of malignant neoplasm of prostate: Secondary | ICD-10-CM

## 2022-04-02 DIAGNOSIS — R5383 Other fatigue: Secondary | ICD-10-CM | POA: Diagnosis not present

## 2022-04-02 DIAGNOSIS — I7381 Erythromelalgia: Secondary | ICD-10-CM

## 2022-04-02 DIAGNOSIS — E1142 Type 2 diabetes mellitus with diabetic polyneuropathy: Secondary | ICD-10-CM | POA: Diagnosis not present

## 2022-04-02 DIAGNOSIS — K219 Gastro-esophageal reflux disease without esophagitis: Secondary | ICD-10-CM | POA: Insufficient documentation

## 2022-04-02 DIAGNOSIS — Z1231 Encounter for screening mammogram for malignant neoplasm of breast: Secondary | ICD-10-CM

## 2022-04-02 DIAGNOSIS — F419 Anxiety disorder, unspecified: Secondary | ICD-10-CM

## 2022-04-02 DIAGNOSIS — Z8673 Personal history of transient ischemic attack (TIA), and cerebral infarction without residual deficits: Secondary | ICD-10-CM | POA: Diagnosis not present

## 2022-04-02 DIAGNOSIS — L299 Pruritus, unspecified: Secondary | ICD-10-CM | POA: Diagnosis not present

## 2022-04-02 DIAGNOSIS — I10 Essential (primary) hypertension: Secondary | ICD-10-CM | POA: Diagnosis not present

## 2022-04-02 DIAGNOSIS — Z90710 Acquired absence of both cervix and uterus: Secondary | ICD-10-CM

## 2022-04-02 LAB — CBC WITH DIFFERENTIAL/PLATELET
Abs Immature Granulocytes: 0.01 10*3/uL (ref 0.00–0.07)
Basophils Absolute: 0.1 10*3/uL (ref 0.0–0.1)
Basophils Relative: 1 %
Eosinophils Absolute: 0.2 10*3/uL (ref 0.0–0.5)
Eosinophils Relative: 2 %
HCT: 43.2 % (ref 36.0–46.0)
Hemoglobin: 14.8 g/dL (ref 12.0–15.0)
Immature Granulocytes: 0 %
Lymphocytes Relative: 38 %
Lymphs Abs: 3 10*3/uL (ref 0.7–4.0)
MCH: 30.2 pg (ref 26.0–34.0)
MCHC: 34.3 g/dL (ref 30.0–36.0)
MCV: 88.2 fL (ref 80.0–100.0)
Monocytes Absolute: 0.5 10*3/uL (ref 0.1–1.0)
Monocytes Relative: 6 %
Neutro Abs: 4.1 10*3/uL (ref 1.7–7.7)
Neutrophils Relative %: 53 %
Platelets: 188 10*3/uL (ref 150–400)
RBC: 4.9 MIL/uL (ref 3.87–5.11)
RDW: 13.6 % (ref 11.5–15.5)
WBC: 7.9 10*3/uL (ref 4.0–10.5)
nRBC: 0 % (ref 0.0–0.2)

## 2022-04-02 NOTE — Patient Instructions (Signed)
[facility] **   You were seen today by Dr. Delton Coombes & Tarri Abernethy PA-C for your elevated hemoglobin.    ELEVATED HEMOGLOBIN You have had a few mild elevations in your hemoglobin, which may have been due to dehydration at the time of testing. Overall, your blood counts have been normal. We will repeat your blood count today. Your mildly elevated hemoglobin is most likely due to your history of smoking cigarettes.  FAMILY HISTORY OF FACTOR V MUTATION We will check factor V mutation analysis.  CANCER SCREENINGS Continue colonoscopy as recommended by your GI doctor. We will order SCREENING MAMMOGRAM since you have not had this test for several years.  Make sure that you continue annual mammograms with your primary care doctor.  FATIGUE You should continue to follow-up with your primary care doctor regarding your fatigue and other symptoms.  LABS:  Check labs today before leaving the hospital building (on the first floor)  FOLLOW-UP APPOINTMENT: Office visit in 3 to 4 weeks to discuss results  ** Thank you for trusting me with your healthcare!  I strive to provide all of my patients with quality care at each visit.  If you receive a survey for this visit, I would be so grateful to you for taking the time to provide feedback.  Thank you in advance!  ~ Rachid Parham                   Dr. Derek Jack   &   Tarri Abernethy, PA-C   - - - - - - - - - - - - - - - - - -    Thank you for choosing [place] at [facility] to provide your oncology and hematology care.  To afford each patient quality time with our provider, please arrive at least 15 minutes before your scheduled appointment time.   If you have a lab appointment with the [facility] please come in thru the Main Entrance and check in at the main information desk.  You need to re-schedule your appointment should you  arrive 10 or more minutes late.  We strive to give you quality time with our providers, and arriving late affects you and other patients whose appointments are after yours.  Also, if you no show three or more times for appointments you may be dismissed from the clinic at the providers discretion.     Again, thank you for choosing [facility].  Our hope is that these requests will decrease the amount of time that you wait before being seen by our physicians.       _____________________________________________________________  Should you have questions after your visit to [facility], please contact our office at [phone] and follow the prompts.  Our office hours are 8:00 a.m. and 4:30 p.m. Monday - Friday.  Please note that voicemails left after 4:00 p.m. may not be returned until the following business day.  We are closed weekends and major holidays.  You do have access to a nurse 24-7, just call the main number to the clinic [phone] and do not press any options, hold on the line and a nurse will answer the phone.    For prescription refill requests, have your pharmacy contact our office and allow 72 hours.

## 2022-04-03 ENCOUNTER — Other Ambulatory Visit: Payer: Self-pay | Admitting: Internal Medicine

## 2022-04-06 LAB — ERYTHROPOIETIN: Erythropoietin: 8.2 m[IU]/mL (ref 2.6–18.5)

## 2022-04-06 LAB — CARBON MONOXIDE, BLOOD (PERFORMED AT REF LAB): Carbon Monoxide, Blood: 10.5 % — ABNORMAL HIGH (ref 0.0–3.6)

## 2022-04-07 ENCOUNTER — Other Ambulatory Visit: Payer: Self-pay | Admitting: Internal Medicine

## 2022-04-08 ENCOUNTER — Other Ambulatory Visit: Payer: Self-pay

## 2022-04-08 ENCOUNTER — Telehealth: Payer: Self-pay | Admitting: Internal Medicine

## 2022-04-08 DIAGNOSIS — E1042 Type 1 diabetes mellitus with diabetic polyneuropathy: Secondary | ICD-10-CM

## 2022-04-08 MED ORDER — INSULIN ASPART FLEXPEN 100 UNIT/ML ~~LOC~~ SOPN: PEN_INJECTOR | SUBCUTANEOUS | 0 refills | Status: DC

## 2022-04-08 NOTE — Telephone Encounter (Signed)
Rx sent to preferred pharmacy.

## 2022-04-08 NOTE — Telephone Encounter (Signed)
Patient called to ask about refill for Novolog Flexpen.  Patient states that pharmacy has not been able to fill because our office has refused the RX.  I see a request but not sure I see anything declining or refusing.  Patient is going out of town tomorrow and will be using the last of the Novolog today.  Patient requesting a call back at [phone]

## 2022-04-08 NOTE — Telephone Encounter (Signed)
RX now sent and patient transferred to front desk to schedule appt.

## 2022-04-08 NOTE — Telephone Encounter (Signed)
RX sent and pt transferred for appt to be scheduled

## 2022-04-09 ENCOUNTER — Institutional Professional Consult (permissible substitution): Payer: Medicare Other | Admitting: Pulmonary Disease

## 2022-04-14 ENCOUNTER — Ambulatory Visit (HOSPITAL_COMMUNITY): Payer: Medicare Other

## 2022-04-14 ENCOUNTER — Other Ambulatory Visit: Payer: Self-pay | Admitting: Internal Medicine

## 2022-04-15 LAB — FACTOR 5 LEIDEN

## 2022-04-21 ENCOUNTER — Other Ambulatory Visit: Payer: Self-pay | Admitting: Podiatry

## 2022-04-22 ENCOUNTER — Ambulatory Visit (HOSPITAL_COMMUNITY)
Admission: RE | Admit: 2022-04-22 | Discharge: 2022-04-22 | Disposition: A | Discharge status: Home / Self Care | Payer: Medicare Other | Source: Ambulatory Visit | Attending: Physician Assistant | Admitting: Physician Assistant

## 2022-04-22 DIAGNOSIS — Z1231 Encounter for screening mammogram for malignant neoplasm of breast: Secondary | ICD-10-CM | POA: Diagnosis not present

## 2022-04-26 ENCOUNTER — Other Ambulatory Visit: Payer: Self-pay | Admitting: Internal Medicine

## 2022-04-26 ENCOUNTER — Other Ambulatory Visit (HOSPITAL_COMMUNITY): Payer: Self-pay | Admitting: Physician Assistant

## 2022-04-26 DIAGNOSIS — R928 Other abnormal and inconclusive findings on diagnostic imaging of breast: Secondary | ICD-10-CM

## 2022-04-27 MED ORDER — PRAVASTATIN SODIUM 20 MG PO TABS: 20.0000 mg | ORAL_TABLET | Freq: Every day | ORAL | 0 refills | Status: DC

## 2022-04-29 ENCOUNTER — Encounter: Payer: Self-pay | Admitting: Physician Assistant

## 2022-04-29 ENCOUNTER — Inpatient Hospital Stay (HOSPITAL_BASED_OUTPATIENT_CLINIC_OR_DEPARTMENT_OTHER): Payer: Medicare Other | Admitting: Physician Assistant

## 2022-04-29 VITALS — BP 125/75 | HR 83 | Temp 98.7°F | Resp 16 | Wt 173.3 lb

## 2022-04-29 DIAGNOSIS — D751 Secondary polycythemia: Secondary | ICD-10-CM | POA: Diagnosis not present

## 2022-04-29 DIAGNOSIS — Z832 Family history of diseases of the blood and blood-forming organs and certain disorders involving the immune mechanism: Secondary | ICD-10-CM

## 2022-04-29 NOTE — Patient Instructions (Addendum)
[facility] [place] **   You were seen today by Tarri Abernethy PA-C for your elevated red blood cells and family history of factor V Leiden mutation.    ELEVATED RED BLOOD CELLS Your blood cells have returned to normal. Since you only had 1 episode of abnormally high red blood cells, we suspect that this was due to dehydration or tobacco use. You do not need to continue follow-up here at the [facility], but your primary care doctor should continue to check your blood count at least once per year and can send you back to see [place] if there are any new abnormalities.  FAMILY HISTORY OF FACTOR V LEIDEN Your testing did not show any genetic mutation of factor V Leiden.  ABNORMAL MAMMOGRAM Your screening mammogram showed an abnormality that needs additional testing. You are scheduled for diagnostic mammogram on 05/06/2022. If this shows no abnormality, you should continue to receive mammograms once a year from your primary care doctor. If this shows any sign of breast cancer, you will need to be referred back to [place] by your primary care doctor.  OTHER SYMPTOMS Continue to follow-up with your primary care doctor for testing and treatment of your other symptoms including fatigue, weight loss, and other symptoms. If you continue to have significant unintentional weight loss (more than 10% of your body mass in the next 3 to 6 months), your primary care doctor could check a CT scan of your chest, abdomen, and pelvis to see if there were any other reasons that you could be losing weight.  ** Thank you for trusting me with your healthcare!  I strive to provide all of my patients with quality care at each visit.  If you receive a survey for this visit, I would be so grateful to you for taking the time to provide feedback.  Thank you in advance!  ~ Martika Egler                   Dr. Derek Jack   &   Tarri Abernethy, PA-C   - - - - - - - - - - - - - - - - - -    Thank you for choosing [place] at [facility] to provide your oncology and hematology care.  To afford each patient quality time with our provider, please arrive at least 15 minutes before your scheduled appointment time.   If you have a lab appointment with the [facility] please come in thru the Main Entrance and check in at the main information desk.  You need to re-schedule your appointment should you arrive 10 or more minutes late.  We strive to give you quality time with our providers, and arriving late affects you and other patients whose appointments are after yours.  Also, if you no show three or more times for appointments you may be dismissed from the clinic at the providers discretion.     Again, thank you for choosing [facility].  Our hope is that these requests will decrease the amount of time that you wait before being seen by our physicians.       _____________________________________________________________  Should you have questions after your visit to [facility], please contact our office at [phone] and follow the prompts.  Our office hours are 8:00 a.m. and 4:30 p.m. Monday - Friday.  Please note that  voicemails left after 4:00 p.m. may not be returned until the following business day.  We are closed weekends and major holidays.  You do have access to a nurse 24-7, just call the main number to the clinic [phone] and do not press any options, hold on the line and a nurse will answer the phone.    For prescription refill requests, have your pharmacy contact our office and allow 72 hours.

## 2022-04-29 NOTE — Progress Notes (Signed)
[name],  [id number]   CLINIC:  Medical Oncology/Hematology  PCP:  Denny Levy, [place] [address] [phone]   REASON FOR VISIT:  Follow-up for erythrocytosis and family history of factor V Leiden mutation  PRIOR THERAPY: None  CURRENT THERAPY: Under work-up  INTERVAL HISTORY:  [name] 51 y.o. female returns for routine follow-up of her erythrocytosis.  She was seen for initial consultation by Dr. Delton Coombes and Tarri Abernethy PA-C on 04/02/2022.  At today's visit, she reports feeling about the same.  No recent hospitalizations, surgeries, or changes in baseline health status.  She denies any changes in her symptoms since her initial consultation 1 month ago.  She continues to report significant fatigue for the past month associated with increased headaches, nausea, occasional aquagenic pruritus, and mild intermittent erythromelalgia.  She also reports lightheadedness.  She has chronic diabetic neuropathy.  She denies any Raynaud's phenomenon, tinnitus, blurry vision, or strokelike symptoms.  She reports drenching night sweats for the past month, sometimes associated with non-shaking chills, other times associated with feeling hot and flushed.  She has already gone through menopause about 2 years ago.  She denies any fevers or shaking chills.  She denies any new lumps or bumps.  She reports that she has been losing weight over the past 4 months.  Per our records, her weight 1 year ago (02/16/2021) was 176 pounds, but she had increased to 192 pounds (09/14/2021) and is now down to 176 pounds (04/02/2022).  She reports poor appetite but denies any early satiety or abdominal pain.  She has little to no energy and 100% appetite. She endorses that she is maintaining a stable weight.   REVIEW OF SYSTEMS:  Review of Systems  Constitutional:  Positive for appetite change, diaphoresis and fatigue. Negative for chills, fever  and unexpected weight change.  HENT:   Negative for lump/mass and nosebleeds.   Eyes:  Negative for eye problems.  Respiratory:  Positive for cough and shortness of breath (With exertion). Negative for hemoptysis.   Cardiovascular:  Positive for leg swelling and palpitations. Negative for chest pain.  Gastrointestinal:  Positive for nausea. Negative for abdominal pain, blood in stool, constipation, diarrhea and vomiting.  Endocrine: Positive for hot flashes.  Genitourinary:  Negative for hematuria.   Skin: Negative.   Neurological:  Positive for dizziness, headaches and numbness. Negative for light-headedness.  Hematological:  Does not bruise/bleed easily.  Psychiatric/Behavioral:  Positive for sleep disturbance. The patient is nervous/anxious.       PAST MEDICAL/SURGICAL HISTORY:  Past Medical History:  Diagnosis Date   Benign paroxysmal vertigo    Cervicalgia    Depression    Diabetes mellitus (HCC)    GERD (gastroesophageal reflux disease)    Hiatal hernia    History of cardiac catheterization    Normal coronary arteries 2016 and 2021   History of TIA (transient ischemic attack)    Hyperlipidemia    Hypertension    IBS (irritable bowel syndrome)    Migraine headache    Panic disorder with agoraphobia    Peripheral neuropathy    Subungual hematoma of digit of hand    Tendonitis    Vertigo    Past Surgical History:  Procedure Laterality Date   ABDOMINAL HYSTERECTOMY     APPENDECTOMY     CARDIAC CATHETERIZATION   last in 2009   x 4, normal coronary arteries   CARDIAC CATHETERIZATION N/A 03/26/2015  Procedure: Left Heart Cath and Coronary Angiography;  Surgeon: Wellington Hampshire, MD;  Location: [facility];  Service: Cardiovascular;  Laterality: N/A;   CARPAL TUNNEL RELEASE Right 08/24/2018   Procedure: CARPAL TUNNEL RELEASE;  Surgeon: Carole Civil, MD;  Location: [facility];  Service: Orthopedics;  Laterality: Right;   CESAREAN SECTION     CHOLECYSTECTOMY      COLONOSCOPY WITH PROPOFOL N/A 01/09/2018   Procedure: COLONOSCOPY WITH PROPOFOL;  Surgeon: Rogene Houston, MD;  Location: [facility];  Service: Endoscopy;  Laterality: N/A;   COLONOSCOPY WITH PROPOFOL N/A 09/21/2019   Procedure: COLONOSCOPY WITH PROPOFOL;  Surgeon: Rogene Houston, MD;  [facility]: [facility];  Service: Endoscopy;  Laterality: N/A;  1055   CYST EXCISION     right breast   ESOPHAGOGASTRODUODENOSCOPY (EGD) WITH PROPOFOL N/A 01/09/2018   Procedure: ESOPHAGOGASTRODUODENOSCOPY (EGD) WITH PROPOFOL;  Surgeon: Rogene Houston, MD;  [facility]: [facility];  Service: Endoscopy;  Laterality: N/A;   LEFT HEART CATH AND CORONARY ANGIOGRAPHY N/A 02/25/2020   Procedure: LEFT HEART CATH AND CORONARY ANGIOGRAPHY;  Surgeon: Troy Sine, MD;  Location: [facility];  Service: Cardiovascular;  Laterality: N/A;   POLYPECTOMY  09/21/2019   Procedure: POLYPECTOMY;  Surgeon: Rogene Houston, MD;  [facility];  Service: Endoscopy;;  ascending colon;   TOOTH EXTRACTION Bilateral 02/16/2018   Procedure: 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 RIGHT MAXILLARY SINUS ANTROSTOMY;  Surgeon: Diona Browner, DDS;  Location: [facility];  Service: Oral Surgery;  Laterality: Bilateral;   TUBAL LIGATION       SOCIAL HISTORY:  Social History   Socioeconomic History   Marital status: Legally Separated    Spouse name: Not on file   Number of children: Not on file   Years of education: Not on file   Highest education level: Not on file  Occupational History   Not on file  Tobacco Use   Smoking status: Every Day    Packs/day: 0.50    Years: 11.00    Total pack years: 5.50    Types: Cigarettes   Smokeless tobacco: Never   Tobacco comments:    10 ciggs per day   Vaping Use   Vaping Use: Never used  Substance and Sexual Activity   Alcohol use: Yes    Alcohol/week: 0.0 standard drinks of alcohol    Comment: occasionally   Drug use: No   Sexual activity: Not on file   Other Topics Concern   Not on file  Social History Narrative   Right handed   Caffeine~4-5 cups per day (diet sun drop)   Lives at home alone    Social Determinants of Health   Financial Resource Strain: Not on file  Food Insecurity: Not on file  Transportation Needs: Not on file  Physical Activity: Not on file  Stress: Not on file  Social Connections: Not on file  Intimate Partner Violence: Not on file    FAMILY HISTORY:  Family History  Problem Relation Age of Onset   Stroke Mother 47   Heart attack Mother 48   Cancer Sister        colon   Heart failure Maternal Grandmother 47   Cancer Maternal Grandfather        prostate   Heart failure Paternal Grandmother 80   Heart failure Paternal Grandfather 70    CURRENT MEDICATIONS:  Outpatient Encounter Medications as of 04/29/2022  Medication Sig  Continuous Blood Gluc Transmit (DEXCOM G6 TRANSMITTER) MISC Use as directed change every 90 days.   diazepam (VALIUM) 5 MG tablet Take 5 mg by mouth 3 (three) times daily as needed for anxiety.    docusate sodium (COLACE) 100 MG capsule Take 100-200 mg by mouth See admin instructions. Take 100 mg by mouth in the morning and 200 mg in the evening   EPIPEN 2-PAK 0.3 MG/0.3ML SOAJ injection Inject 0.3 mg into the muscle as needed for anaphylaxis.    estradiol (ESTRACE) 1 MG tablet Take 1 mg by mouth daily.   Glucagon 3 MG/DOSE POWD Place 3 mg into the nose once as needed for up to 1 dose.   Insulin Aspart FlexPen (NOVOLOG) 100 UNIT/ML INJECT UP TO 60 UNITS IN THE PUMP DAILY.   Insulin Disposable Pump (OMNIPOD 5 G6 POD, GEN 5,) MISC 1 applicator by Does not apply route every 3 (three) days.   Insulin Human (INSULIN PUMP) SOLN Inject 1 each into the skin as directed. Humalog insulin   lisinopril (PRINIVIL,ZESTRIL) 5 MG tablet Take 5 mg by mouth daily.   nitroGLYCERIN (NITROSTAT) 0.4 MG SL tablet Place 1 tablet (0.4 mg total) under the tongue every 5 (five) minutes x 3 doses as needed  for chest pain (if no relief after 3rd dose, proceed to the ED for an evaluation or call 911). (Patient not taking: Reported on 04/02/2022)   oxyCODONE-acetaminophen (PERCOCET) 10-325 MG tablet Take 1 tablet by mouth 2 (two) times daily as needed.   pravastatin (PRAVACHOL) 20 MG tablet Take 1 tablet (20 mg total) by mouth at bedtime.   promethazine (PHENERGAN) 25 MG tablet Take 1 tablet (25 mg total) by mouth every 6 (six) hours as needed for nausea or vomiting. (Patient not taking: Reported on 04/02/2022)   No facility-administered encounter medications on file as of 04/29/2022.    ALLERGIES:  Allergies  Allergen Reactions   Bee Venom Anaphylaxis and Hives   Zocor [Simvastatin] Other (See Comments)    Muscle aches and pains   Clindamycin/Lincomycin Rash   Codeine Rash   Penicillins Rash    Did it involve swelling of the face/tongue/throat, SOB, or low BP? No Did it involve sudden or severe rash/hives, skin peeling, or any reaction on the inside of your mouth or nose? Yes Did you need to seek medical attention at a hospital or doctor's office? Yes When did it last happen?      30 years If all above answers are "NO", may proceed with cephalosporin use.    Sulfa Antibiotics Rash          PHYSICAL EXAM:  ECOG PERFORMANCE STATUS: 1 - Symptomatic but completely ambulatory  There were no vitals filed for this visit. There were no vitals filed for this visit. Physical Exam Constitutional:      Appearance: Normal appearance. She is obese.  HENT:     Head: Normocephalic and atraumatic.     Mouth/Throat:     Mouth: Mucous membranes are moist.  Eyes:     Extraocular Movements: Extraocular movements intact.     Pupils: Pupils are equal, round, and reactive to light.  Cardiovascular:     Rate and Rhythm: Normal rate and regular rhythm.     Pulses: Normal pulses.     Heart sounds: Normal heart sounds.  Pulmonary:     Effort: Pulmonary effort is normal.     Breath sounds: Normal breath  sounds.  Abdominal:     General: Bowel sounds are normal.  Palpations: Abdomen is soft.     Tenderness: There is no abdominal tenderness.  Musculoskeletal:        General: No swelling.     Right lower leg: No edema.     Left lower leg: No edema.  Lymphadenopathy:     Cervical: No cervical adenopathy.  Skin:    General: Skin is warm and dry.  Neurological:     General: No focal deficit present.     Mental Status: She is alert and oriented to person, place, and time.  Psychiatric:        Mood and Affect: Mood is anxious.        Behavior: Behavior normal.     LABORATORY DATA:  I have reviewed the labs as listed.  CBC    Component Value Date/Time   WBC 7.9 04/02/2022 1021   RBC 4.90 04/02/2022 1021   HGB 14.8 04/02/2022 1021   HCT 43.2 04/02/2022 1021   PLT 188 04/02/2022 1021   MCV 88.2 04/02/2022 1021   MCH 30.2 04/02/2022 1021   MCHC 34.3 04/02/2022 1021   RDW 13.6 04/02/2022 1021   LYMPHSABS 3.0 04/02/2022 1021   MONOABS 0.5 04/02/2022 1021   EOSABS 0.2 04/02/2022 1021   BASOSABS 0.1 04/02/2022 1021      Latest Ref Rng & Units 03/16/2022    3:39 PM 10/06/2021    2:07 PM 02/21/2020    1:22 PM  CMP  Glucose 70 - 99 mg/dL 192  305  193   BUN 6 - 20 mg/dL '9  14  13   '$ Creatinine 0.44 - 1.00 mg/dL 0.84  0.80  0.68   Sodium 135 - 145 mmol/L 137  133  138   Potassium 3.5 - 5.1 mmol/L 4.1  4.4  4.2   Chloride 98 - 111 mmol/L 106  101  105   CO2 22 - 32 mmol/L '22  25  25   '$ Calcium 8.9 - 10.3 mg/dL 9.2  9.0  9.0   Total Protein 6.0 - 8.3 g/dL  6.5    Total Bilirubin 0.2 - 1.2 mg/dL  0.5    Alkaline Phos 39 - 117 U/L  81    AST 0 - 37 U/L  20    ALT 0 - 35 U/L  14      DIAGNOSTIC IMAGING:  I have independently reviewed the relevant imaging and discussed with the patient.  ASSESSMENT & PLAN: 1.  Erythrocytosis, mild - Seen at the request of Denny Levy, PA-C for evaluation of erythrocytosis. - Per PCP note from 03/18/2022, patient has been having persistent  fatigue and is very concerned about this.  Her CBC (03/09/2022) was noted to have some mild erythrocytosis with Hgb 16.2 and HCT 46.2%, therefore she is referred to hematology. - CBC obtained during ED visit on 03/16/2022 showed normal Hgb 14.9 and normal HCT 42.8. - Smokes 0.5 PPD x10 years - Referred for sleep medicine consult with Dr. Merlene Laughter later this month - No history of cardiopulmonary disease, carbon monoxide exposure, or diuretics - No family history of MPN - Reports fatigue, aquagenic pruritus, and mild intermittent erythromelalgia for the past month - Most recent CBC (04/02/2022): Normal Hgb 14.8/HCT 43.2%.  Completely normal CBC.  Erythropoietin normal.  Carbon monoxide elevated at 10.5%, likely secondary to patient's cigarette use. - PLAN: No concern for MPN or other hematologic condition at this time.  Patient only had mild abnormality on isolated CBC, and since that time it has normalized without intervention. -  Recommend smoking cessation - No indication for further hematology follow-up at this time.  Will discharge from clinic. - Recommend that PCP check CBC at least once annually and re-refer patient to [place] in the future if indicated.  2.  Family history of factor V Leiden - Patient is very concerned regarding family history of factor V mutation in one cousin - She has history of TIA, but denies any history of DVT or PE. - Patient's sister had DVT and PE, unknown if provoked or unprovoked - Patient requested factor V Leiden testing, which was NEGATIVE - PLAN: No further hematology work-up required  3.  Abnormal screening mammogram - At initial consultation in September 2023, was noted the patient was not up-to-date on mammograms, with last mammogram done "several years ago," records not available in EMR. - Screening mammogram was ordered by our office. - Screening mammogram (04/22/2022): Right breast calcifications warrant further evaluation with diagnostic mammogram - PLAN:  Patient is scheduled for diagnostic mammogram on 05/06/2022.  Will follow results.  If no abnormalities, patient can resume annual screening mammograms via PCP.  4.  Fatigue, weight loss, other symptoms  - Patient reports feeling poorly for the past month with fatigue, headaches, nausea, poor appetite, hot flashes with night sweats, and weight loss. - Patient has had fluctuating weight over the past year - weight 1 year ago (02/16/2021) was 176 pounds, but she had increased to 192 pounds (09/14/2021) and is now down to 173 pounds (04/29/2022) - No lymphadenopathy or hepatosplenomegaly palpated on exam - PLAN: Recommend continued follow-up with PCP for the symptoms described in HPI.  5. Other history - Up-to-date on colonoscopy, most recently in February 2021, which showed polyp (sessile serrated polyp negative for high-grade dysplasia or malignancy) and hemorrhoids. - PMH: Anxiety with panic disorder, depression, type 2 diabetes mellitus, GERD, TIA, hyperlipidemia, and hypertension. - SOCIAL: She is on disability secondary to her diabetic neuropathy.  She lives at home alone.  She smokes 0.5 PPD cigarettes.  She will rarely drink alcohol.  She denies any illicit drug use. - FAMILY: Family history significant for sister deceased at age 67 from colon cancer.  Maternal grandfather had prostate cancer.  Paternal grandmother had pancreatic cancer.  Niece passed away at age 7 from brain tumor.   PLAN SUMMARY & DISPOSITION: Tentative discharge from clinic: Initially referred for hematology problem, which was negative and does not need to be followed - however, patient had abnormal mammogram recently.  If this turns out to be breast cancer, she will need to be referred back to [place] as an NEW PATIENT oncology visit.  All questions were answered. The patient knows to call the clinic with any problems, questions or concerns.  Medical decision making: Moderate  Time spent on visit: I spent 20 minutes counseling  the patient face to face. The total time spent in the appointment was 30 minutes and more than 50% was on counseling.   Harriett Rush, PA-C  04/29/2022 2:16 PM

## 2022-05-02 ENCOUNTER — Other Ambulatory Visit: Payer: Self-pay | Admitting: Internal Medicine

## 2022-05-02 DIAGNOSIS — E1042 Type 1 diabetes mellitus with diabetic polyneuropathy: Secondary | ICD-10-CM

## 2022-05-05 ENCOUNTER — Ambulatory Visit (INDEPENDENT_AMBULATORY_CARE_PROVIDER_SITE_OTHER): Payer: Medicare Other | Admitting: Podiatry

## 2022-05-05 ENCOUNTER — Telehealth: Payer: Self-pay | Admitting: Urology

## 2022-05-05 DIAGNOSIS — M7741 Metatarsalgia, right foot: Secondary | ICD-10-CM

## 2022-05-05 DIAGNOSIS — M7751 Other enthesopathy of right foot: Secondary | ICD-10-CM

## 2022-05-05 NOTE — Telephone Encounter (Signed)
DOS - 05/20/22  METATARSAL OSTEOTOMY 3,4 RIGHT --- [address]  [facility] EFFECTIVE DATE - 12/31/21   PLAN DEDUCTIBLE - $0.00 OUT OF POCKET - $8,300.00 W/ $7,555.64 REMAINING COINSURANCE - 20% COPAY - $0.00   PER [employer] WEBSITE FOR CPT CODE 11155 Notification or Prior Authorization is not required for the requested services  Decision ID #:[id number]

## 2022-05-05 NOTE — Progress Notes (Signed)
Chief Complaint  Patient presents with   Foot Pain    Patient is here still having right foot pain.the patient states that she has been taking otc motrin and tylenol and is still having pain.    HPI: 51 y.o. female presenting today for evaluation of pain and tenderness associated to the third toe and forefoot of the right foot.  Patient does have a history of bunionectomy as well as second metatarsal head resection several years prior.  Patient states that she is having pain and tenderness associated specifically to the third toe.  Pain with ambulation despite wearing good supportive shoes and arch supports.  Past Medical History:  Diagnosis Date   Benign paroxysmal vertigo    Cervicalgia    Depression    Diabetes mellitus (HCC)    GERD (gastroesophageal reflux disease)    Hiatal hernia    History of cardiac catheterization    Normal coronary arteries 2016 and 2021   History of TIA (transient ischemic attack)    Hyperlipidemia    Hypertension    IBS (irritable bowel syndrome)    Migraine headache    Panic disorder with agoraphobia    Peripheral neuropathy    Subungual hematoma of digit of hand    Tendonitis    Vertigo    Past Surgical History:  Procedure Laterality Date   ABDOMINAL HYSTERECTOMY     APPENDECTOMY     CARDIAC CATHETERIZATION   last in 2009   x 4, normal coronary arteries   CARDIAC CATHETERIZATION N/A 03/26/2015   Procedure: Left Heart Cath and Coronary Angiography;  Surgeon: Wellington Hampshire, MD;  Location: [facility];  Service: Cardiovascular;  Laterality: N/A;   CARPAL TUNNEL RELEASE Right 08/24/2018   Procedure: CARPAL TUNNEL RELEASE;  Surgeon: Carole Civil, MD;  Location: [facility];  Service: Orthopedics;  Laterality: Right;   CESAREAN SECTION     CHOLECYSTECTOMY     COLONOSCOPY WITH PROPOFOL N/A 01/09/2018   Procedure: COLONOSCOPY WITH PROPOFOL;  Surgeon: Rogene Houston, MD;  [facility];  Service: Endoscopy;  Laterality: N/A;    COLONOSCOPY WITH PROPOFOL N/A 09/21/2019   Procedure: COLONOSCOPY WITH PROPOFOL;  Surgeon: Rogene Houston, MD;  [facility];  Service: Endoscopy;  Laterality: N/A;  1055   CYST EXCISION     right breast   ESOPHAGOGASTRODUODENOSCOPY (EGD) WITH PROPOFOL N/A 01/09/2018   Procedure: ESOPHAGOGASTRODUODENOSCOPY (EGD) WITH PROPOFOL;  Surgeon: Rogene Houston, MD;  [facility];  Service: Endoscopy;  Laterality: N/A;   LEFT HEART CATH AND CORONARY ANGIOGRAPHY N/A 02/25/2020   Procedure: LEFT HEART CATH AND CORONARY ANGIOGRAPHY;  Surgeon: Troy Sine, MD;  Location: [facility];  Service: Cardiovascular;  Laterality: N/A;   POLYPECTOMY  09/21/2019   Procedure: POLYPECTOMY;  Surgeon: Rogene Houston, MD;  [facility];  Service: Endoscopy;;  ascending colon;   TOOTH EXTRACTION Bilateral 02/16/2018   Procedure: 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 No Did it involve  sudden or severe rash/hives, skin peeling, or any reaction on the inside of your mouth or nose? Yes Did you need to seek medical attention at a hospital or doctor's office? Yes When did it last happen?      30 years If all above answers are "NO", may proceed with cephalosporin use.    Sulfa Antibiotics Rash         Physical Exam: General: The patient is alert and oriented x3 in no acute distress.  Dermatology: Skin is warm, dry and supple bilateral lower extremities. Negative for open lesions or macerations.  Vascular: Palpable pedal pulses bilaterally. No  edema or erythema noted. Capillary refill within normal limits.  Neurological: Epicritic and protective threshold grossly intact bilaterally.   Musculoskeletal Exam: Today there is significant pain on palpation with a plantarflexed metatarsal head to the third MTP joint of the right foot.  Pain with range of motion to the third MTP right foot.  Radiographic exam RT foot 05/05/2022: Elongated third metatarsal head compared to the second metatarsal head resection and adjacent fourth metatarsal.  This third metatarsal extends beyond the metatarsal parabola and correlates clinically with the patient's pain and tenderness secondary to increased pressure to the third metatarsal head  MR FOOT RT WO CONTRAST 01/16/2022: IMPRESSION: 1. Marrow edema in the third metatarsal shaft most concerning for stress reaction. 2. 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   Assessment: 1.  History of second metatarsal head resection right 2.  Third MTP capsulitis/metatarsalgia right  Plan of Care:  1. Patient evaluated.   2.  The patient has tried wearing different shoes and arch supports with no relief of her symptoms.  I do believe the pain is coming from the elongated third metatarsal.  We did discuss that shortening osteotomy of the third and possibly the fourth metatarsal heads of the right foot and the patient agrees and would like to proceed.  Risk benefits advantages and disadvantages were all explained in detail.  No guarantees were expressed or implied. 3.  The patient is diabetic.  Last A1c she states was 8.5.  Ideally I would like to get the patient's A1c below 8.0 prior to surgery.  Patient states that she is now on an insulin pump and her sugar is improving significantly. 4.  Authorization for surgery was initiated today.  Surgery will consist of Weil shortening osteotomy third metatarsal and possibly fourth metatarsal right foot.   This is essentially to recreate the metatarsal parabola and offload pressure from the elongated third metatarsal 5.  Return to clinic 1 week postop  Edrick Kins, DPM [facility]  Dr. Edrick Kins, DPM    [address], [place] [address]                Office [phone]  Fax [phone]

## 2022-05-06 ENCOUNTER — Ambulatory Visit (HOSPITAL_COMMUNITY)
Admission: RE | Admit: 2022-05-06 | Discharge: 2022-05-06 | Disposition: A | Discharge status: Home / Self Care | Payer: Medicare Other | Source: Ambulatory Visit | Attending: Physician Assistant | Admitting: Physician Assistant

## 2022-05-06 ENCOUNTER — Encounter (HOSPITAL_COMMUNITY): Payer: Self-pay

## 2022-05-06 DIAGNOSIS — R928 Other abnormal and inconclusive findings on diagnostic imaging of breast: Secondary | ICD-10-CM

## 2022-05-10 ENCOUNTER — Encounter: Payer: Self-pay | Admitting: Physician Assistant

## 2022-05-10 ENCOUNTER — Telehealth: Payer: Self-pay

## 2022-05-10 NOTE — Telephone Encounter (Signed)
Pt called to advise she is having surgery next week and wanted to know how she should administer her insulin while her pump is off. She was advised no eating after midnight the night before surgery and she is scheduled for 10 am.

## 2022-05-11 NOTE — Telephone Encounter (Signed)
Pt contacted and advised She should use the basal rates and only use correction boluses for the night before the procedure and on the morning of the procedure since not eating. When she resumes eating, she should resume mealtime boluses. Pt will contact the office if she needs further instructions.

## 2022-05-20 ENCOUNTER — Encounter: Payer: Self-pay | Admitting: Podiatry

## 2022-05-20 ENCOUNTER — Other Ambulatory Visit: Payer: Self-pay | Admitting: Podiatry

## 2022-05-20 ENCOUNTER — Telehealth: Payer: Self-pay | Admitting: Podiatry

## 2022-05-20 ENCOUNTER — Telehealth: Payer: Self-pay | Admitting: *Deleted

## 2022-05-20 DIAGNOSIS — M21541 Acquired clubfoot, right foot: Secondary | ICD-10-CM | POA: Diagnosis not present

## 2022-05-20 MED ORDER — OXYCODONE-ACETAMINOPHEN 7.5-325 MG PO TABS: 1.0000 | ORAL_TABLET | ORAL | 0 refills | Status: DC | PRN

## 2022-05-20 MED ORDER — OXYCODONE-ACETAMINOPHEN 5-325 MG PO TABS: 1.0000 | ORAL_TABLET | ORAL | 0 refills | Status: DC | PRN

## 2022-05-20 MED ORDER — IBUPROFEN 800 MG PO TABS: 800.0000 mg | ORAL_TABLET | Freq: Three times a day (TID) | ORAL | 1 refills | Status: DC

## 2022-05-20 NOTE — Telephone Encounter (Signed)
Medication changed to percocet 7.5. Please notify patient. - Dr. Amalia Hailey

## 2022-05-20 NOTE — Telephone Encounter (Signed)
I just sent the 7.5 percs to [facility]. They have that one in stock. - Dr. Amalia Hailey

## 2022-05-20 NOTE — Telephone Encounter (Signed)
Patient notified thru voice message

## 2022-05-20 NOTE — Progress Notes (Signed)
PRN postop 

## 2022-05-20 NOTE — Telephone Encounter (Signed)
Patient is calling to ask that prescription for oxycodone-ace, 5-325 mg be resent to [facility] (has in stock)Drug, [facility] is out of stock, please resend.

## 2022-05-20 NOTE — Telephone Encounter (Signed)
Pharmacist called and states that the oxyCODONE-acetaminophen (PERCOCET) 5-325 MG tablet is on a long term back over for over a month. They do have the 7.5 and 10 MG in stock.    Please send in new RX for the strength you choose.  Please advise.

## 2022-05-21 ENCOUNTER — Telehealth: Payer: Self-pay | Admitting: *Deleted

## 2022-05-21 NOTE — Telephone Encounter (Addendum)
Patient is calling to request something for nausea from pain medicine, please send something to help with this. Please advise / send to [facility].

## 2022-05-23 ENCOUNTER — Other Ambulatory Visit: Payer: Self-pay | Admitting: Internal Medicine

## 2022-05-23 DIAGNOSIS — E1042 Type 1 diabetes mellitus with diabetic polyneuropathy: Secondary | ICD-10-CM

## 2022-05-24 ENCOUNTER — Other Ambulatory Visit: Payer: Self-pay | Admitting: Internal Medicine

## 2022-05-24 ENCOUNTER — Other Ambulatory Visit: Payer: Self-pay | Admitting: Podiatry

## 2022-05-24 DIAGNOSIS — E1042 Type 1 diabetes mellitus with diabetic polyneuropathy: Secondary | ICD-10-CM

## 2022-05-24 MED ORDER — PROMETHAZINE HCL 25 MG PO TABS: 25.0000 mg | ORAL_TABLET | Freq: Four times a day (QID) | ORAL | 0 refills | Status: DC | PRN

## 2022-05-24 NOTE — Telephone Encounter (Signed)
Just sent in a prescription for Phenergan 25 mg.  Please notify patient this morning.  Thanks, Dr. Amalia Hailey

## 2022-05-24 NOTE — Telephone Encounter (Signed)
Patient has been notified

## 2022-05-24 NOTE — Progress Notes (Signed)
As needed postop 

## 2022-05-26 ENCOUNTER — Ambulatory Visit (INDEPENDENT_AMBULATORY_CARE_PROVIDER_SITE_OTHER): Payer: Medicare Other | Admitting: Podiatry

## 2022-05-26 ENCOUNTER — Ambulatory Visit (INDEPENDENT_AMBULATORY_CARE_PROVIDER_SITE_OTHER): Payer: Medicare Other

## 2022-05-26 DIAGNOSIS — Z9889 Other specified postprocedural states: Secondary | ICD-10-CM | POA: Diagnosis not present

## 2022-06-02 ENCOUNTER — Ambulatory Visit (INDEPENDENT_AMBULATORY_CARE_PROVIDER_SITE_OTHER): Payer: Medicare Other | Admitting: Podiatry

## 2022-06-02 DIAGNOSIS — Z9889 Other specified postprocedural states: Secondary | ICD-10-CM

## 2022-06-02 NOTE — Progress Notes (Signed)
Chief Complaint  Patient presents with   Routine Post Op    POV #2 DOS 05/20/2022 [place] OSTEOTOMY 3RD METATARSAL & POSS 4TH METATARSAL RT    Subjective:  Patient presents today status post Weil shortening osteotomy of the third and fourth metatarsals. DOS: 05/20/2022.  Patient well.  She is weightbearing in the cam boot as instructed.  No new complaints at this time Past Medical History:  Diagnosis Date   Benign paroxysmal vertigo    Cervicalgia    Depression    Diabetes mellitus ([place])    GERD (gastroesophageal reflux disease)    Hiatal hernia    History of cardiac catheterization    Normal coronary arteries 2016 and 2021   History of TIA (transient ischemic attack)    Hyperlipidemia    Hypertension    IBS (irritable bowel syndrome)    Migraine headache    Panic disorder with agoraphobia    Peripheral neuropathy    Subungual hematoma of digit of hand    Tendonitis    Vertigo     Past Surgical History:  Procedure Laterality Date   ABDOMINAL HYSTERECTOMY     APPENDECTOMY     CARDIAC CATHETERIZATION   last in 2009   x 4, normal coronary arteries   CARDIAC CATHETERIZATION N/A 03/26/2015   Procedure: Left Heart Cath and Coronary Angiography;  Surgeon: Wellington Hampshire, MD;  Location: [facility];  Service: Cardiovascular;  Laterality: N/A;   CARPAL TUNNEL RELEASE Right 08/24/2018   Procedure: CARPAL TUNNEL RELEASE;  Surgeon: Carole Civil, MD;  [facility];  Service: Orthopedics;  Laterality: Right;   CESAREAN SECTION     CHOLECYSTECTOMY     COLONOSCOPY WITH PROPOFOL N/A 01/09/2018   Procedure: COLONOSCOPY WITH PROPOFOL;  Surgeon: Rogene Houston, MD;  [facility];  Service: Endoscopy;  Laterality: N/A;   COLONOSCOPY WITH PROPOFOL N/A 09/21/2019   Procedure: COLONOSCOPY WITH PROPOFOL;  Surgeon: Rogene Houston, MD;  [facility];  Service: Endoscopy;  Laterality: N/A;  1055   CYST EXCISION     right breast    ESOPHAGOGASTRODUODENOSCOPY (EGD) WITH PROPOFOL N/A 01/09/2018   Procedure: ESOPHAGOGASTRODUODENOSCOPY (EGD) WITH PROPOFOL;  Surgeon: Rogene Houston, MD;  [facility];  Service: Endoscopy;  Laterality: N/A;   LEFT HEART CATH AND CORONARY ANGIOGRAPHY N/A 02/25/2020   Procedure: LEFT HEART CATH AND CORONARY ANGIOGRAPHY;  Surgeon: Troy Sine, MD;  Location: [facility];  Service: Cardiovascular;  Laterality: N/A;   POLYPECTOMY  09/21/2019   Procedure: POLYPECTOMY;  Surgeon: Rogene Houston, MD;  [facility];  Service: Endoscopy;;  ascending colon;   TOOTH EXTRACTION Bilateral 02/16/2018   Procedure: 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 No Did it involve sudden or severe rash/hives, skin peeling, or any reaction on the inside of your mouth or nose? Yes Did you need to seek medical attention at a hospital or doctor's office? Yes When did it last happen?  30 years If all above answers are "NO", may proceed with cephalosporin use.    Sulfa Antibiotics Rash         Objective/Physical Exam Neurovascular status intact.  Skin incisions appear to be well coapted with sutures intact. No sign of infectious process noted. No dehiscence. No active bleeding noted.  Neck no for any appreciable edema noted to the surgical extremity.  Radiographic Exam RT foot 05/26/2022:  Orthopedic hardware and osteotomies sites appear to be stable with routine healing.  Appropriate shortening of the third and fourth metatarsals to recreate the  metatarsal parabola noted.  Assessment: 1. s/p Weil shortening osteotomies third and fourth metatarsals right foot. DOS: 05/20/2022   Plan of Care:  1. Patient was evaluated.  Sutures removed today 2.  Continue WBAT in the cam boot 3.  Return to clinic in 2 weeks for follow-up x-ray   Edrick Kins, DPM [facility]  Dr. Edrick Kins, DPM    [address], [place] [address]                Office [phone]  Fax [phone]

## 2022-06-02 NOTE — Progress Notes (Signed)
Chief Complaint  Patient presents with   Post-op Follow-up    Patient is here for post op visit, DOS 05/20/22    Subjective:  Patient presents today status post Weil shortening osteotomy of the third and fourth metatarsals. DOS: 05/20/2022.  Patient states that she is doing well.  She has been minimally weightbearing in the cam boot as instructed.  No new complaints at this time  Past Medical History:  Diagnosis Date   Benign paroxysmal vertigo    Cervicalgia    Depression    Diabetes mellitus ([place])    GERD (gastroesophageal reflux disease)    Hiatal hernia    History of cardiac catheterization    Normal coronary arteries 2016 and 2021   History of TIA (transient ischemic attack)    Hyperlipidemia    Hypertension    IBS (irritable bowel syndrome)    Migraine headache    Panic disorder with agoraphobia    Peripheral neuropathy    Subungual hematoma of digit of hand    Tendonitis    Vertigo     Past Surgical History:  Procedure Laterality Date   ABDOMINAL HYSTERECTOMY     APPENDECTOMY     CARDIAC CATHETERIZATION   last in 2009   x 4, normal coronary arteries   CARDIAC CATHETERIZATION N/A 03/26/2015   Procedure: Left Heart Cath and Coronary Angiography;  Surgeon: Wellington Hampshire, MD;  Location: [place] [facility];  Service: Cardiovascular;  Laterality: N/A;   CARPAL TUNNEL RELEASE Right 08/24/2018   Procedure: CARPAL TUNNEL RELEASE;  Surgeon: Carole Civil, MD;  [facility];  Service: Orthopedics;  Laterality: Right;   CESAREAN SECTION     CHOLECYSTECTOMY     COLONOSCOPY WITH PROPOFOL N/A 01/09/2018   Procedure: COLONOSCOPY WITH PROPOFOL;  Surgeon: Rogene Houston, MD;  [facility];  Service: Endoscopy;  Laterality: N/A;   COLONOSCOPY WITH PROPOFOL N/A 09/21/2019   Procedure: COLONOSCOPY WITH PROPOFOL;  Surgeon: Rogene Houston, MD;  [facility];  Service: Endoscopy;  Laterality: N/A;  1055   CYST EXCISION     right breast    ESOPHAGOGASTRODUODENOSCOPY (EGD) WITH PROPOFOL N/A 01/09/2018   Procedure: ESOPHAGOGASTRODUODENOSCOPY (EGD) WITH PROPOFOL;  Surgeon: Rogene Houston, MD;  [facility];  Service: Endoscopy;  Laterality: N/A;   LEFT HEART CATH AND CORONARY ANGIOGRAPHY N/A 02/25/2020   Procedure: LEFT HEART CATH AND CORONARY ANGIOGRAPHY;  Surgeon: Troy Sine, MD;  Location: [facility];  Service: Cardiovascular;  Laterality: N/A;   POLYPECTOMY  09/21/2019   Procedure: POLYPECTOMY;  Surgeon: Rogene Houston, MD;  [facility];  Service: Endoscopy;;  ascending colon;   TOOTH EXTRACTION Bilateral 02/16/2018   Procedure: 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 No Did it involve sudden or severe rash/hives, skin peeling, or any reaction on the inside of your mouth or nose? Yes Did you need to seek medical attention at a hospital or doctor's office? Yes When did it last happen?  30 years If all above answers are "NO", may proceed with cephalosporin use.    Sulfa Antibiotics Rash         Objective/Physical Exam Neurovascular status intact.  Skin incisions appear to be well coapted with sutures intact. No sign of infectious process noted. No dehiscence. No active bleeding noted. Moderate edema noted to the surgical extremity.  Radiographic Exam RT foot 05/26/2022:  Orthopedic hardware and osteotomies sites appear to be stable with routine healing.  Appropriate shortening of the third and fourth metatarsals to recreate the metatarsal parabola  noted.  Assessment: 1. s/p Weil shortening osteotomies third and fourth metatarsals right foot. DOS: 05/20/2022   Plan of Care:  1. Patient was evaluated. X-rays reviewed 2.  Dressings changed.  Clean dry and intact x1 week 3.  Continue minimal WBAT in the cam boot 4.  Return to clinic in 1 week for possible suture removal   Edrick Kins, DPM [facility]  Dr. Edrick Kins, DPM    [address], [place] [address]                Office [phone]  Fax [phone]

## 2022-06-14 ENCOUNTER — Ambulatory Visit (INDEPENDENT_AMBULATORY_CARE_PROVIDER_SITE_OTHER): Payer: Medicare Other | Admitting: Internal Medicine

## 2022-06-14 ENCOUNTER — Encounter: Payer: Self-pay | Admitting: Internal Medicine

## 2022-06-14 VITALS — BP 110/68 | HR 68 | Ht 67.0 in

## 2022-06-14 DIAGNOSIS — R7989 Other specified abnormal findings of blood chemistry: Secondary | ICD-10-CM | POA: Diagnosis not present

## 2022-06-14 DIAGNOSIS — E1065 Type 1 diabetes mellitus with hyperglycemia: Secondary | ICD-10-CM

## 2022-06-14 DIAGNOSIS — E782 Mixed hyperlipidemia: Secondary | ICD-10-CM

## 2022-06-14 DIAGNOSIS — E1042 Type 1 diabetes mellitus with diabetic polyneuropathy: Secondary | ICD-10-CM

## 2022-06-14 LAB — POCT GLYCOSYLATED HEMOGLOBIN (HGB A1C): Hemoglobin A1C: 8.7 % — AB (ref 4.0–5.6)

## 2022-06-14 MED ORDER — DEXCOM G6 SENSOR MISC: 1.0000 | 3 refills | Status: AC

## 2022-06-14 MED ORDER — INSULIN ASPART FLEXPEN 100 UNIT/ML ~~LOC~~ SOPN: PEN_INJECTOR | SUBCUTANEOUS | 3 refills | Status: DC

## 2022-06-14 MED ORDER — PRAVASTATIN SODIUM 20 MG PO TABS: 20.0000 mg | ORAL_TABLET | Freq: Every day | ORAL | 3 refills | Status: DC

## 2022-06-14 MED ORDER — METFORMIN HCL 500 MG PO TABS: 500.0000 mg | ORAL_TABLET | Freq: Two times a day (BID) | ORAL | 3 refills | Status: DC

## 2022-06-14 MED ORDER — OMNIPOD 5 DEXG7G6 PODS GEN 5 MISC: 1.0000 | 3 refills | Status: DC

## 2022-06-14 MED ORDER — DEXCOM G6 TRANSMITTER MISC: 3 refills | Status: DC

## 2022-06-14 NOTE — Progress Notes (Signed)
Patient ID: [name], female   DOB: 1971-07-08, 51 y.o.   MRN: [id number]  HPI: [name] is a 51 y.o.-year-old female, returning for f/u for DM1, dx in 32 (51 y/o), uncontrolled, with complications (cerebro-vascular ds - h/o TIA 0/2016, PN, DR). Last visit 9 months ago.  She is here with her husband. Insurance: [employer].  Interim history: No increased urination, blurry vision, chest pain.   She continues to have numbness and tingling in her feet. She still has some nausea. She lost almost 20 pounds since last visit!  She is in a wheelchair - after foot surgery 1 mo ago. She has a lot of stress taking care of her parents >> feels very tired.  Insulin pump: -Previously OmniPod -Then Medtronic 670 G insulin pump but could not afford the Enlite CGM -then T: slim X2-started 10/2019 -now Omnipod5 - started 06/30/2021 - likes this better.  She is using NovoLog FlexPen the reservoir.  She prefers this.  s to fill out  CGM: -Dexcom G6  Insulin: -Sugars improved after her insurance switched coverage from Humalog to NovoLog  Supplies:  -[place]  Reviewed HbA1c levels: Lab Results  Component Value Date   HGBA1C 9.5 (A) 09/14/2021   HGBA1C 9.5 (A) 02/16/2021   HGBA1C 9.2 (A) 10/17/2020   Previously on: - Lantus 25 units in am and 10 units at night - Novolog ICR 1:17, target 150, ISF 1:25  Now on insulin pump: Pump settings:  - basal rates: 12 am: 0.6 >> 0.9 - ICR:              12 am: 1:14 >> 1:11 - target: 70-110 >> 110-150 - ISF: 50 - Active insulin time: 4 hours  She was also on: - Metformin 500 mg 2x a day - started 09/2021 -stopped after she started to be more fatigued, but she realized that this was not really related to metformin  She checks her sugars more than 4 times a day with her CGM:  Previously:   Lowest sugar was 27 in 07/2014...>> 30s (!) - sensor pb. >> 60s. No hypoglycemia awareness!  She has a glucagon pen at home. Highest sugar  was 601 .. >> 1200 (!!!) ...>> 296 >> 400 >> 500 (infusion site pb) >> 400s (Covid19) >> 400 (sensor No history of hypoglycemia admissions.   She  had hyperglycemia ED visits and an admission in 12/2017. She had a DKA admission in 09/2018 - with suspicion of stroke.  This was ruled out by MRI and she also had normal carotid Dopplers afterwards.   -No CKD, last BUN/creatinine:  Lab Results  Component Value Date   BUN 9 03/16/2022   CREATININE 0.84 03/16/2022  On Lisinopril 5 mg daily.  -+ HL; last set of lipids: Lab Results  Component Value Date   CHOL 162 10/06/2021   HDL 59.10 10/06/2021   LDLCALC 79 10/06/2021   TRIG 121.0 10/06/2021   CHOLHDL 3 10/06/2021  On pravastatin 20, fish oil 1200 mg daily.  - last eye exam was in 04/2021: + DR.  -+ Numbness and tingling in feet and hands. She was previously on Neurontin but she stopped due to side effects. Currently on Lyrica.  Last foot exam 08/2021.  Reviewed B12 levels: Lab Results  Component Value Date   VITAMINB12 >1504 (H) 10/06/2021   VITAMINB12 650 10/17/2020   VITAMINB12 >1526 (H) 04/04/2020   VITAMINB12 >1500 (H) 12/26/2018   VITAMINB12 481 12/06/2014  She stopped B12 in 2020.  No history of hypothyroidism: Lab Results  Component Value Date   TSH 2.82 10/06/2021   On Diazepam. Stopped Wellbutrin due to constipation. She had tooth surgery in 01/2018, Dr. Britta Mccreedy.   She had bilateral big toe surgery in 09/2016 with Dr. Amalia Hailey.  ROS: + See HPI  I reviewed pt's medications, allergies, PMH, social hx, family hx, and changes were documented in the history of present illness. Otherwise, unchanged from my initial visit note.  Past Medical History:  Diagnosis Date   Benign paroxysmal vertigo    Cervicalgia    Depression    Diabetes mellitus (HCC)    GERD (gastroesophageal reflux disease)    Hiatal hernia    History of cardiac catheterization    Normal coronary arteries 2016 and 2021   History of TIA (transient  ischemic attack)    Hyperlipidemia    Hypertension    IBS (irritable bowel syndrome)    Migraine headache    Panic disorder with agoraphobia    Peripheral neuropathy    Subungual hematoma of digit of hand    Tendonitis    Vertigo    Past Surgical History:  Procedure Laterality Date   ABDOMINAL HYSTERECTOMY     APPENDECTOMY     CARDIAC CATHETERIZATION   last in 2009   x 4, normal coronary arteries   CARDIAC CATHETERIZATION N/A 03/26/2015   Procedure: Left Heart Cath and Coronary Angiography;  Surgeon: Wellington Hampshire, MD;  Location: [place] [facility];  Service: Cardiovascular;  Laterality: N/A;   CARPAL TUNNEL RELEASE Right 08/24/2018   Procedure: CARPAL TUNNEL RELEASE;  Surgeon: Carole Civil, MD;  [facility]: [facility];  Service: Orthopedics;  Laterality: Right;   CESAREAN SECTION     CHOLECYSTECTOMY     COLONOSCOPY WITH PROPOFOL N/A 01/09/2018   Procedure: COLONOSCOPY WITH PROPOFOL;  Surgeon: Rogene Houston, MD;  [facility];  Service: Endoscopy;  Laterality: N/A;   COLONOSCOPY WITH PROPOFOL N/A 09/21/2019   Procedure: COLONOSCOPY WITH PROPOFOL;  Surgeon: Rogene Houston, MD;  [facility];  Service: Endoscopy;  Laterality: N/A;  1055   CYST EXCISION     right breast   ESOPHAGOGASTRODUODENOSCOPY (EGD) WITH PROPOFOL N/A 01/09/2018   Procedure: ESOPHAGOGASTRODUODENOSCOPY (EGD) WITH PROPOFOL;  Surgeon: Rogene Houston, MD;  [facility];  Service: Endoscopy;  Laterality: N/A;   LEFT HEART CATH AND CORONARY ANGIOGRAPHY N/A 02/25/2020   Procedure: LEFT HEART CATH AND CORONARY ANGIOGRAPHY;  Surgeon: Troy Sine, MD;  Location: [place] [facility];  Service: Cardiovascular;  Laterality: N/A;   POLYPECTOMY  09/21/2019   Procedure: POLYPECTOMY;  Surgeon: Rogene Houston, MD;  [facility];  Service: Endoscopy;;  ascending colon;   TOOTH EXTRACTION Bilateral 02/16/2018   Procedure: 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 RIGHT  MAXILLARY SINUS ANTROSTOMY;  Surgeon: Diona Browner, DDS;  Location: [place];  Service: Oral Surgery;  Laterality: Bilateral;   TUBAL LIGATION     History   Social History   Marital Status: Divorced    Spouse Name: N/A   Number of Children: 1   Occupational History   disabled   Social History Main Topics   Smoking status: Current Every Day Smoker -- 0.50 packs/day for 11 years   Smokeless tobacco: Never Used     Comment: patient is aware that she needs to quit smoking   Alcohol Use: No   Drug Use: No   Current Outpatient Medications on File  Prior to Visit  Medication Sig Dispense Refill   Continuous Blood Gluc Transmit (DEXCOM G6 TRANSMITTER) MISC Use as directed change every 90 days. 1 each 3   diazepam (VALIUM) 5 MG tablet Take 5 mg by mouth 3 (three) times daily as needed for anxiety.      docusate sodium (COLACE) 100 MG capsule Take 100-200 mg by mouth See admin instructions. Take 100 mg by mouth in the morning and 200 mg in the evening     EPIPEN 2-PAK 0.3 MG/0.3ML SOAJ injection Inject 0.3 mg into the muscle as needed for anaphylaxis.      estradiol (ESTRACE) 1 MG tablet Take 1 mg by mouth daily.     Glucagon 3 MG/DOSE POWD Place 3 mg into the nose once as needed for up to 1 dose. 1 each 11   ibuprofen (ADVIL) 800 MG tablet Take 1 tablet (800 mg total) by mouth 3 (three) times daily. 90 tablet 1   Insulin Aspart FlexPen (NOVOLOG) 100 UNIT/ML INJECT UP TO 60 UNITS IN THE PUMP DAILY. 15 mL 0   Insulin Disposable Pump (OMNIPOD 5 G6 POD, GEN 5,) MISC 1 applicator by Does not apply route every 3 (three) days. 30 each 3   Insulin Human (INSULIN PUMP) SOLN Inject 1 each into the skin as directed. Humalog insulin     lisinopril (PRINIVIL,ZESTRIL) 5 MG tablet Take 5 mg by mouth daily.  2   nitroGLYCERIN (NITROSTAT) 0.4 MG SL tablet Place 1 tablet (0.4 mg total) under the tongue every 5 (five) minutes x 3 doses as needed for chest pain (if no relief after 3rd dose, proceed to the ED for an  evaluation or call 911). 25 tablet 3   oxyCODONE-acetaminophen (PERCOCET) 7.5-325 MG tablet Take 1 tablet by mouth every 4 (four) hours as needed for severe pain. 30 tablet 0   pravastatin (PRAVACHOL) 20 MG tablet Take 1 tablet (20 mg total) by mouth at bedtime. 90 tablet 0   promethazine (PHENERGAN) 25 MG tablet Take 1 tablet (25 mg total) by mouth every 6 (six) hours as needed for nausea or vomiting. 30 tablet 0   No current facility-administered medications on file prior to visit.   Allergies  Allergen Reactions   Bee Venom Anaphylaxis and Hives   Zocor [Simvastatin] Other (See Comments)    Muscle aches and pains   Clindamycin/Lincomycin Rash   Codeine Rash   Penicillins Rash    Did it involve swelling of the face/tongue/throat, SOB, or low BP? No Did it involve sudden or severe rash/hives, skin peeling, or any reaction on the inside of your mouth or nose? Yes Did you need to seek medical attention at a hospital or doctor's office? Yes When did it last happen?      30 years If all above answers are "NO", may proceed with cephalosporin use.    Sulfa Antibiotics Rash        Family History  Problem Relation Age of Onset   Stroke Mother 74   Heart attack Mother 65   Cancer Sister        colon   Heart failure Maternal Grandmother 39   Cancer Maternal Grandfather        prostate   Heart failure Paternal Grandmother 24   Heart failure Paternal Grandfather 11   PE: BP 110/68 (BP Location: Left Arm, Patient Position: Sitting, Cuff Size: Normal)   Pulse 68   Ht '5\' 7"'$  (1.702 m)   SpO2 99%   BMI 27.14  kg/m   Wt Readings from Last 3 Encounters:  04/29/22 173 lb 4.8 oz (78.6 kg)  04/02/22 176 lb 6.4 oz (80 kg)  10/08/21 189 lb 8 oz (86 kg)   Constitutional: overweight, in NAD, in wheelchair, right foot in boot Eyes:  EOMI, no exophthalmos ENT: no neck masses, no cervical lymphadenopathy Cardiovascular: RRR, No MRG Respiratory: CTA B Musculoskeletal: no deformities Skin:no  rashes Neurological: no tremor with outstretched hands  ASSESSMENT: 1. DM1, uncontrolled, with complications - cerebro-vascular ds - h/o TIA 0/2016 - PN - DR Sees cardiology >> Dr. Maurice Small - [facility]  2. HL  3.  High B12  PLAN:  1. Patient with longstanding, uncontrolled, type 1 diabetes, on insulin pump, changed from t:slim x2 to OmniPod before last visit.  She was previously on insulin pump after her episode of DKA in 09/2018 and sugars improved after she started the t:slim pump.  Sugars improved even more after switching to the OmniPod 5. -At last visit, HbA1c was high, at 9.5, but stable from before.  At that time, sugars appears to be better controlled overnight, increasing after the first meal of the day and peaking around 3 PM and then again after dinner.  We could not download her pump but I reviewed the pump records on her part.  She was doing a better job entering carbs with meals but she was still not entering the with every meal.  I advised her to strengthen her insulin to carb ratio.  At that time she was wondering about ways to decrease her insulin resistance.  We discussed about GLP-1 receptor agonist which were not FDA approved but I suggested metformin.  She was able to start since last visit and use it for several months but she stopped as she developed more fatigue.  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 CGM interpretation: -At today's visit, we reviewed her CGM downloads: It appears that 59% of values are in target range (goal >70%), while 41% are higher than 180 (goal <25%), and 0% are lower than 70 (goal <4%).  The calculated average blood sugar is 175.  The projected HbA1c for the next 3 months (GMI) is 7.5%. -Reviewing the CGM trends, sugars appear to be fluctuating within the target range but she has higher values after meals.  Upon review of her pump download, she  is not usually entering enough carbs into the pump as she has days in which she only enters a small amount or not at all.  We discussed about the importance of entering all of the carbs into the pump and bolusing before every meal.  For now, since she tolerated metformin well and she was able to lose 20 pounds since last visit, I recommended to start back on metformin.  She agrees.  Otherwise, I do not feel that she needs a change in her pump settings. - I suggested to:  Patient Instructions  Please use the following pump settings: - basal rates: 12 am: 0.9 - ICR:              12 am: 1:11 - target: 110-150 - ISF: 50 - Active insulin time: 4 hours  Also, restart metformin 500 mg twice a day with meals.  Please do the following approximately 15 minutes before every meal and snack: - Enter carbs (C) - Enter sugars (S) - Start insulin bolus (I)  Please return in 4-6  months.   - we checked her HbA1c: 8.7% (lower) - advised to check sugars at different times of the day - 4x a day, rotating check times - advised for yearly eye exams >> she is not UTD, but she has this scheduled - return to clinic in 4 months  2. HL -Reviewed the latest lipid panel from 09/2021: Fractions at goal: Lab Results  Component Value Date   CHOL 162 10/06/2021   HDL 59.10 10/06/2021   LDLCALC 79 10/06/2021   TRIG 121.0 10/06/2021   CHOLHDL 3 10/06/2021  -Continues pravastatin 20 mg daily without side effects.  This was refilled today.  3.  High B12 -She had a high B12 level in the past on supplements -She came off supplements afterwards. -At last visit, a B12 level was still high: Lab Results  Component Value Date   PPHKFEXM14 >7092 (H) 10/06/2021  -We will repeat this at next visit  Philemon Kingdom, MD PhD [facility] Endocrinology

## 2022-06-14 NOTE — Patient Instructions (Addendum)
Please use the following pump settings: - basal rates: 12 am: 0.9 - ICR:              12 am: 1:11 - target: 110-150 - ISF: 50 - Active insulin time: 4 hours  Also, restart metformin 500 mg twice a day with meals.  Please do the following approximately 15 minutes before every meal and snack: - Enter carbs (C) - Enter sugars (S) - Start insulin bolus (I)  Please return in 4-6 months.

## 2022-06-16 ENCOUNTER — Ambulatory Visit (INDEPENDENT_AMBULATORY_CARE_PROVIDER_SITE_OTHER): Payer: Medicare Other

## 2022-06-16 ENCOUNTER — Ambulatory Visit (INDEPENDENT_AMBULATORY_CARE_PROVIDER_SITE_OTHER): Payer: Medicare Other | Admitting: Podiatry

## 2022-06-16 DIAGNOSIS — Z9889 Other specified postprocedural states: Secondary | ICD-10-CM

## 2022-06-21 NOTE — Progress Notes (Signed)
Chief Complaint  Patient presents with   Post-op Follow-up    Patient is here for #3 post op visit right foot.patient states tat healing is going good at home.    Subjective:  Patient presents today status post Weil shortening osteotomy of the third and fourth metatarsals. DOS: 05/20/2022.  Patient continues to do well.  Weightbearing as tolerated in the cam boot.  No new complaints at this time  Past Medical History:  Diagnosis Date   Benign paroxysmal vertigo    Cervicalgia    Depression    Diabetes mellitus ([place])    GERD (gastroesophageal reflux disease)    Hiatal hernia    History of cardiac catheterization    Normal coronary arteries 2016 and 2021   History of TIA (transient ischemic attack)    Hyperlipidemia    Hypertension    IBS (irritable bowel syndrome)    Migraine headache    Panic disorder with agoraphobia    Peripheral neuropathy    Subungual hematoma of digit of hand    Tendonitis    Vertigo     Past Surgical History:  Procedure Laterality Date   ABDOMINAL HYSTERECTOMY     APPENDECTOMY     CARDIAC CATHETERIZATION   last in 2009   x 4, normal coronary arteries   CARDIAC CATHETERIZATION N/A 03/26/2015   Procedure: Left Heart Cath and Coronary Angiography;  Surgeon: Wellington Hampshire, MD;  Location: [place] [facility];  Service: Cardiovascular;  Laterality: N/A;   CARPAL TUNNEL RELEASE Right 08/24/2018   Procedure: CARPAL TUNNEL RELEASE;  Surgeon: Carole Civil, MD;  [facility];  Service: Orthopedics;  Laterality: Right;   CESAREAN SECTION     CHOLECYSTECTOMY     COLONOSCOPY WITH PROPOFOL N/A 01/09/2018   Procedure: COLONOSCOPY WITH PROPOFOL;  Surgeon: Rogene Houston, MD;  [facility];  Service: Endoscopy;  Laterality: N/A;   COLONOSCOPY WITH PROPOFOL N/A 09/21/2019   Procedure: COLONOSCOPY WITH PROPOFOL;  Surgeon: Rogene Houston, MD;  [facility];  Service: Endoscopy;  Laterality: N/A;  1055   CYST EXCISION     right  breast   ESOPHAGOGASTRODUODENOSCOPY (EGD) WITH PROPOFOL N/A 01/09/2018   Procedure: ESOPHAGOGASTRODUODENOSCOPY (EGD) WITH PROPOFOL;  Surgeon: Rogene Houston, MD;  [facility];  Service: Endoscopy;  Laterality: N/A;   LEFT HEART CATH AND CORONARY ANGIOGRAPHY N/A 02/25/2020   Procedure: LEFT HEART CATH AND CORONARY ANGIOGRAPHY;  Surgeon: Troy Sine, MD;  Location: [place] [facility];  Service: Cardiovascular;  Laterality: N/A;   POLYPECTOMY  09/21/2019   Procedure: POLYPECTOMY;  Surgeon: Rogene Houston, MD;  [facility];  Service: Endoscopy;;  ascending colon;   TOOTH EXTRACTION Bilateral 02/16/2018   Procedure: 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 No Did it involve sudden or severe rash/hives, skin peeling, or any reaction on the inside of your mouth or nose? Yes Did you need to seek medical attention at a hospital or doctor's office? Yes When did  it last happen?      30 years If all above answers are "NO", may proceed with cephalosporin use.    Sulfa Antibiotics Rash         Objective/Physical Exam Neurovascular status intact.  Skin incisions appear to be well coapted with sutures intact. No sign of infectious process noted. No dehiscence. No active bleeding noted.  Neck no for any appreciable edema noted to the surgical extremity.  Radiographic Exam RT foot 06/16/2022:  Orthopedic hardware and osteotomies sites appear to be stable with routine healing.  Absence of the second metatarsal head noted.  Appropriate shortening of  the third and fourth metatarsals to recreate the metatarsal parabola noted.  Assessment: 1. s/p Weil shortening osteotomies third and fourth metatarsals right foot. DOS: 05/20/2022 2. Prior history of second metatarsal head resection RT   Plan of Care:  1. Patient was evaluated.  X-rays reviewed today  2.  Continue WBAT in the cam boot for an additional 4 weeks 3.  Return to clinic 4 weeks   Edrick Kins, DPM [facility]  Dr. Edrick Kins, DPM    [address], [place] [address]                Office [phone]  Fax [phone]

## 2022-07-02 ENCOUNTER — Institutional Professional Consult (permissible substitution): Payer: Medicare Other | Admitting: Pulmonary Disease

## 2022-07-13 ENCOUNTER — Encounter: Payer: Self-pay | Admitting: Podiatry

## 2022-07-19 ENCOUNTER — Encounter: Payer: Medicare Other | Admitting: Podiatry

## 2022-07-28 ENCOUNTER — Ambulatory Visit (INDEPENDENT_AMBULATORY_CARE_PROVIDER_SITE_OTHER): Payer: Medicare Other | Admitting: Podiatry

## 2022-07-28 ENCOUNTER — Ambulatory Visit (INDEPENDENT_AMBULATORY_CARE_PROVIDER_SITE_OTHER): Payer: Medicare Other

## 2022-07-28 DIAGNOSIS — Z9889 Other specified postprocedural states: Secondary | ICD-10-CM

## 2022-07-28 NOTE — Progress Notes (Signed)
Chief Complaint  Patient presents with   Post-op Follow-up    Patient is here for post op follow-up 4 DOS 05/20/2022 [place] OSTEOTOMY 3RD METATARSAL & POSS 4TH METATARSAL RT, patient states that she has no pain.    Subjective:  Patient presents today status post Weil shortening osteotomy of the third and fourth metatarsals. DOS: 05/20/2022.  Patient continues to do well.  She is WBAT in the cam boot as instructed.  No other complaints at this time.  Patient states that she has been ambulating without any appreciable pain or tenderness.  Past Medical History:  Diagnosis Date   Benign paroxysmal vertigo    Cervicalgia    Depression    Diabetes mellitus (HCC)    GERD (gastroesophageal reflux disease)    Hiatal hernia    History of cardiac catheterization    Normal coronary arteries 2016 and 2021   History of TIA (transient ischemic attack)    Hyperlipidemia    Hypertension    IBS (irritable bowel syndrome)    Migraine headache    Panic disorder with agoraphobia    Peripheral neuropathy    Subungual hematoma of digit of hand    Tendonitis    Vertigo     Past Surgical History:  Procedure Laterality Date   ABDOMINAL HYSTERECTOMY     APPENDECTOMY     CARDIAC CATHETERIZATION   last in 2009   x 4, normal coronary arteries   CARDIAC CATHETERIZATION N/A 03/26/2015   Procedure: Left Heart Cath and Coronary Angiography;  Surgeon: Wellington Hampshire, MD;  Location: [place] [facility];  Service: Cardiovascular;  Laterality: N/A;   CARPAL TUNNEL RELEASE Right 08/24/2018   Procedure: CARPAL TUNNEL RELEASE;  Surgeon: Carole Civil, MD;  [facility];  Service: Orthopedics;  Laterality: Right;   CESAREAN SECTION     CHOLECYSTECTOMY     COLONOSCOPY WITH PROPOFOL N/A 01/09/2018   Procedure: COLONOSCOPY WITH PROPOFOL;  Surgeon: Rogene Houston, MD;  [facility];  Service: Endoscopy;  Laterality: N/A;   COLONOSCOPY WITH PROPOFOL N/A 09/21/2019   Procedure:  COLONOSCOPY WITH PROPOFOL;  Surgeon: Rogene Houston, MD;  [facility];  Service: Endoscopy;  Laterality: N/A;  1055   CYST EXCISION     right breast   ESOPHAGOGASTRODUODENOSCOPY (EGD) WITH PROPOFOL N/A 01/09/2018   Procedure: ESOPHAGOGASTRODUODENOSCOPY (EGD) WITH PROPOFOL;  Surgeon: Rogene Houston, MD;  [facility];  Service: Endoscopy;  Laterality: N/A;   LEFT HEART CATH AND CORONARY ANGIOGRAPHY N/A 02/25/2020   Procedure: LEFT HEART CATH AND CORONARY ANGIOGRAPHY;  Surgeon: Troy Sine, MD;  Location: [place] [facility];  Service: Cardiovascular;  Laterality: N/A;   POLYPECTOMY  09/21/2019   Procedure: POLYPECTOMY;  Surgeon: Rogene Houston, MD;  [facility];  Service: Endoscopy;;  ascending colon;   TOOTH EXTRACTION Bilateral 02/16/2018   Procedure: 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 No Did it involve sudden or severe rash/hives, skin peeling, or any reaction on  the inside of your mouth or nose? Yes Did you need to seek medical attention at a hospital or doctor's office? Yes When did it last happen?      30 years If all above answers are "NO", may proceed with cephalosporin use.    Sulfa Antibiotics Rash         Objective/Physical Exam Neurovascular status intact.  Skin incisions appear to be well coapted with sutures intact. No sign of infectious process noted. No dehiscence. No active bleeding noted.  Neck no for any appreciable edema noted to the surgical extremity.  Radiographic Exam RT foot 07/28/2022:   Orthopedic hardware and osteotomies sites appear to be stable with routine healing.  Absence of the second metatarsal head noted.  Appropriate shortening of the third and fourth metatarsals to recreate the metatarsal parabola noted.  Assessment: 1. s/p Weil shortening osteotomies third and fourth metatarsals right foot. DOS: 05/20/2022 2. Prior history of second metatarsal head resection RT   Plan of Care:  1. Patient was evaluated.  X-rays reviewed today  2.  Patient may now transition out of the cam boot into good supportive tennis shoes and sneakers 3.  Return to clinic 2 months for follow-up   Edrick Kins, DPM [facility]  Dr. Edrick Kins, DPM    [address], [place] [address]                Office [phone]  Fax [phone]

## 2022-07-29 LAB — HM DIABETES EYE EXAM

## 2022-08-03 ENCOUNTER — Ambulatory Visit: Payer: Medicare Other | Attending: Nurse Practitioner | Admitting: Nurse Practitioner

## 2022-08-03 ENCOUNTER — Encounter: Payer: Self-pay | Admitting: Nurse Practitioner

## 2022-08-03 VITALS — BP 114/74 | HR 73 | Ht 67.0 in | Wt 165.2 lb

## 2022-08-03 DIAGNOSIS — E785 Hyperlipidemia, unspecified: Secondary | ICD-10-CM

## 2022-08-03 DIAGNOSIS — R0602 Shortness of breath: Secondary | ICD-10-CM

## 2022-08-03 DIAGNOSIS — R5383 Other fatigue: Secondary | ICD-10-CM

## 2022-08-03 DIAGNOSIS — I10 Essential (primary) hypertension: Secondary | ICD-10-CM | POA: Diagnosis not present

## 2022-08-03 DIAGNOSIS — F439 Reaction to severe stress, unspecified: Secondary | ICD-10-CM

## 2022-08-03 DIAGNOSIS — R079 Chest pain, unspecified: Secondary | ICD-10-CM

## 2022-08-03 NOTE — Progress Notes (Signed)
Cardiology Office Note:    Date:  08/03/2022   ID:  [name], DOB 1971-08-01, MRN [id number]  PCP:  Denny Levy, [place] Providers Cardiologist:  Rozann Lesches, MD     Referring MD: Denny Levy, [place]   CC: Here for 1 year follow-up  History of Present Illness:    [name] is a 52 y.o. female with a hx of the following:  HTN HLD T2DM Vertigo GERD Hx of TIA   Patient is a delightful 52 year old female with past medical history as mentioned above.  Previous cardiovascular history includes normal, low risk NST in 2021.  Echocardiogram in 2021 revealed normal EF.  Underwent cardiac catheterization in 2021 that revealed normal coronary arteries, normal EF, LVEDP 17 mmHg.  It was suggested that patient's chest pain was most likely noncardiac in etiology.   Last seen by Dr. Domenic Polite on July 23, 2021. Patient noted occasional episodes of left arm numbness and sometimes associated chest discomfort. Did not seem to be related to exertion, possibly stress related.  No medication changes were made.  Was told to follow-up in 1 year.  Patient presents for 1 year follow-up.  She states she does have occasional episodes of chest pain. Describes as sharp, brief in duration, and sometimes radiates. Noticed a few times per month, attributes this to stress. BP well controlled at home. Says she has been under stress recently. 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 Has had ongoing shortness of breath since July 2023, associated symptom includes fatigue. Denies any palpitations, syncope, near syncope, dizziness, lightheadedness, orthopnea, PND, swelling, significant weight changes, acute bleeding, or claudication.    Past Medical History:  Diagnosis Date   Benign paroxysmal vertigo    Cervicalgia    Depression    Diabetes mellitus (HCC)    GERD (gastroesophageal reflux disease)    Hiatal hernia    History of cardiac catheterization     Normal coronary arteries 2016 and 2021   History of TIA (transient ischemic attack)    Hyperlipidemia    Hypertension    IBS (irritable bowel syndrome)    Migraine headache    Panic disorder with agoraphobia    Peripheral neuropathy    Subungual hematoma of digit of hand    Tendonitis    Vertigo     Past Surgical History:  Procedure Laterality Date   ABDOMINAL HYSTERECTOMY     APPENDECTOMY     CARDIAC CATHETERIZATION   last in 2009   x 4, normal coronary arteries   CARDIAC CATHETERIZATION N/A 03/26/2015   Procedure: Left Heart Cath and Coronary Angiography;  Surgeon: Wellington Hampshire, MD;  Location: [facility];  Service: Cardiovascular;  Laterality: N/A;   CARPAL TUNNEL RELEASE Right 08/24/2018   Procedure: CARPAL TUNNEL RELEASE;  Surgeon: Carole Civil, MD;  Location: [facility];  Service: Orthopedics;  Laterality: Right;   CESAREAN SECTION     CHOLECYSTECTOMY     COLONOSCOPY WITH PROPOFOL N/A 01/09/2018   Procedure: COLONOSCOPY WITH PROPOFOL;  Surgeon: Rogene Houston, MD;  Location: [facility];  Service: Endoscopy;  Laterality: N/A;   COLONOSCOPY WITH PROPOFOL N/A 09/21/2019   Procedure: COLONOSCOPY WITH PROPOFOL;  Surgeon: Rogene Houston, MD;  [facility];  Service: Endoscopy;  Laterality: N/A;  1055   CYST EXCISION     right breast   ESOPHAGOGASTRODUODENOSCOPY (EGD) WITH PROPOFOL N/A 01/09/2018   Procedure: ESOPHAGOGASTRODUODENOSCOPY (EGD) WITH PROPOFOL;  Surgeon: Hildred Laser  U, MD;  [facility]: [facility];  Service: Endoscopy;  Laterality: N/A;   LEFT HEART CATH AND CORONARY ANGIOGRAPHY N/A 02/25/2020   Procedure: LEFT HEART CATH AND CORONARY ANGIOGRAPHY;  Surgeon: Troy Sine, MD;  Location: [facility];  Service: Cardiovascular;  Laterality: N/A;   POLYPECTOMY  09/21/2019   Procedure: POLYPECTOMY;  Surgeon: Rogene Houston, MD;  [facility];  Service: Endoscopy;;  ascending colon;   TOE SURGERY Right    3 rd and 4  th toes   TOOTH EXTRACTION Bilateral 02/16/2018   Procedure: 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   diazepam (VALIUM) 5 MG tablet Take 5 mg by mouth 3 (three) times daily as needed for anxiety.    EPIPEN 2-PAK 0.3 MG/0.3ML SOAJ injection Inject 0.3 mg into the muscle as needed for anaphylaxis.    estradiol (ESTRACE) 1 MG tablet Take 1 mg by mouth daily.   Glucagon 3 MG/DOSE POWD Place 3 mg into the nose once as needed for up to 1 dose.   ibuprofen (ADVIL) 800 MG tablet Take 1 tablet (800 mg total) by mouth 3 (three) times daily.   Insulin Disposable Pump (OMNIPOD 5 G6 POD, GEN 5,) MISC 1 applicator by Does not apply route every 3 (three) days.   lisinopril (PRINIVIL,ZESTRIL) 5 MG tablet Take 5 mg by mouth daily.   nitroGLYCERIN (NITROSTAT) 0.4 MG SL tablet Place 1 tablet (0.4 mg total) under the tongue every 5 (five) minutes x 3 doses as needed for chest pain (if no relief after 3rd dose, proceed to the ED for an evaluation or call 911).   pravastatin (PRAVACHOL) 20 MG tablet Take 1 tablet (20 mg total) by mouth at bedtime.   promethazine (PHENERGAN) 25 MG tablet Take 1 tablet (25 mg total) by mouth every 6 (six) hours as needed for nausea or vomiting.     Allergies:   Bee venom, Zocor [simvastatin], Clindamycin/lincomycin, Codeine, Penicillins, and Sulfa antibiotics   Social History   Socioeconomic History   Marital status: Legally Separated    Spouse name: Not on file   Number of children: Not on file   Years of education: Not on file   Highest education level: Not on file  Occupational History   Not on file  Tobacco Use   Smoking status: Every Day    Packs/day: 0.75     Years: 11.00    Total pack years: 8.25    Types: Cigarettes   Smokeless tobacco: Never   Tobacco comments:    10 ciggs per day   Vaping Use   Vaping Use: Never used  Substance and Sexual Activity   Alcohol use: Not Currently    Comment: occasionally   Drug use: No   Sexual activity: Not on file  Other Topics Concern   Not on file  Social History Narrative   Right handed   Caffeine~4-5 cups per day (diet sun drop)   Lives at home alone    Social Determinants of Health   Financial Resource Strain: Not on file  Food Insecurity: Not on file  Transportation Needs: Not on file  Physical Activity: Not on file  Stress: Not on file  Social Connections: Not on file     Family History: The patient's family history includes Cancer in her maternal grandfather and sister; Heart attack (age of onset: 34) in her mother; Heart failure (age of onset: 74) in her paternal grandfather; Heart failure (age of onset: 39) in her maternal grandmother; Heart failure (age of onset: 40) in her paternal grandmother; Stroke (age of onset: 13) in her mother.  ROS:  Review of Systems  Constitutional:  Positive for malaise/fatigue. Negative for chills, diaphoresis, fever and weight loss.  HENT: Negative.    Eyes: Negative.   Respiratory:  Positive for shortness of breath. Negative for cough, hemoptysis, sputum production and wheezing.        See HPI.   Cardiovascular:  Positive for chest pain. Negative for palpitations, orthopnea, claudication, leg swelling and PND.       See HPI.   Gastrointestinal: Negative.   Genitourinary: Negative.   Musculoskeletal: Negative.   Skin: Negative.   Neurological: Negative.   Endo/Heme/Allergies: Negative.   Psychiatric/Behavioral:  Negative for depression, hallucinations, memory loss, substance abuse and suicidal ideas. The patient is not nervous/anxious and does not have insomnia.        Stress. See HPI.     Please see the history of present illness.    All  other systems reviewed and are negative.  EKGs/Labs/Other Studies Reviewed:    The following studies were reviewed today:   EKG:  EKG is not ordered today.  Left heart cath on 02/25/2020: The left ventricular systolic function is normal. LV end diastolic pressure is normal.   Normal epicardial coronary arteries in a dominant left coronary system.   Hyperdynamic LV function with EF at 65%; LVEDP 17 mmHg.   RECOMMENDATION: The patient's chest pain most likely is noncardiac in etiology.  Optimal blood pressure control with ideal blood pressure less than 120/80 aggressive lipid-lowering therapy in this diabetic female with target LDL less than 70.  Smoking cessation is essential.  Echocardiogram on 11/29/2019: 1. Left ventricular ejection fraction, by estimation, is 60 to 65%. The  left ventricle has normal function. The left ventricle has no regional  wall motion abnormalities. Left ventricular diastolic parameters were  normal.   2. Right ventricular systolic function is normal. The right ventricular  size is normal.   3. The mitral valve is normal in structure. No evidence of mitral valve  regurgitation. No evidence of mitral stenosis.   4. The aortic valve is tricuspid. Aortic valve regurgitation is not  visualized. No aortic stenosis is present.   5. The inferior vena cava is normal in size with greater than 50%  respiratory variability, suggesting right atrial pressure of 3 mmHg.  Myoview on 11/29/2019: There was no ST segment deviation noted during stress. The study is normal. There are no perfusion defects This is a low risk study. The left ventricular ejection fraction is normal (55-65%).  Recent Labs: 10/06/2021: ALT 14; TSH 2.82 03/16/2022: BUN 9; Creatinine, Ser 0.84; Potassium 4.1; Sodium 137 04/02/2022: Hemoglobin 14.8; Platelets 188  Recent Lipid Panel    Component Value Date/Time   CHOL 162 10/06/2021 1407   TRIG 121.0 10/06/2021 1407   HDL 59.10 10/06/2021 1407    CHOLHDL 3 10/06/2021 1407   VLDL 24.2 10/06/2021 1407   LDLCALC 79 10/06/2021 1407    Physical Exam:    VS:  BP 114/74   Pulse 73   Ht '5\' 7"'$  (1.702 m)   Wt 165 lb 3.2  oz (74.9 kg)   SpO2 96%   BMI 25.87 kg/m     Wt Readings from Last 3 Encounters:  08/03/22 165 lb 3.2 oz (74.9 kg)  04/29/22 173 lb 4.8 oz (78.6 kg)  04/02/22 176 lb 6.4 oz (80 kg)     GEN: Well nourished, well developed in no acute distress HEENT: Normal NECK: No JVD; No carotid bruits CARDIAC: S1/S2, RRR, no murmurs, rubs, gallops; 2+ peripheral pulses throughout, strong and equal bilaterally RESPIRATORY:  Clear to auscultation without rales, wheezing or rhonchi  MUSCULOSKELETAL:  No edema; No deformity  SKIN: Warm and dry NEUROLOGIC:  Alert and oriented x 3 PSYCHIATRIC:  Normal affect   ASSESSMENT:    1. Chest pain of uncertain etiology   2. Hypertension, unspecified type   3. Hyperlipidemia, unspecified hyperlipidemia type   4. SOB (shortness of breath)   5. Fatigue, unspecified type   6. Stress    PLAN:    In order of problems listed above:  Chest pain of uncertain etiology Etiology unclear. Cardiac Cath in 2021 showed normal coronary arteries. Noted CP at last OV with Dr. Domenic Polite, likely non-cardiac in nature. Continue current medication regimen. Heart healthy diet and regular cardiovascular exercise encouraged.   HTN BP stable today. BP well controlled at home. Continue current medication regimen. Heart healthy diet and regular cardiovascular exercise encouraged.   HLD Lipid panel WNL in 09/2021. Continue pravastatin. Heart healthy diet and regular cardiovascular exercise encouraged.   Shortness of breath, fatigue Chronic and ongoing, stable. Etiology multifactorial. Most likely related to #5  - see below. Echo in 2021 showed normal EF. Overall reassuring Echo. Recent labs stable. Will update Echo at this time. Continue current medication regimen. Heart healthy diet and regular  cardiovascular exercise encouraged.   Stress Does admit to recent stressors recently. Providing care to family member with Alzheimer's. Will refer to therapy and she is agreeable to allow me to refer her to LCSW, Westley Hummer.   6. Disposition: Follow-up with me in 3 months or sooner if anything changes.    Medication Adjustments/Labs and Tests Ordered: Current medicines are reviewed at length with the patient today.  Concerns regarding medicines are outlined above.  Orders Placed This Encounter  Procedures   Ambulatory referral to [facility]   ECHOCARDIOGRAM COMPLETE   No orders of the defined types were placed in this encounter.   Patient Instructions  Medication Instructions:  Your physician recommends that you continue on your current medications as directed. Please refer to the Current Medication list given to you today.  *If you need a refill on your cardiac medications before your next appointment, please call your pharmacy*   Lab Work: NONE  If you have labs (blood work) drawn today and your tests are completely normal, you will receive your results only by: [place] (if you have MyChart) OR A paper copy in the mail If you have any lab test that is abnormal or we need to change your treatment, we will call you to review the results.   Testing/Procedures: Your physician has requested that you have an echocardiogram. Echocardiography is a painless test that uses sound waves to create images of your heart. It provides your doctor with information about the size and shape of your heart and how well your heart's chambers and valves are working. This procedure takes approximately one hour. There are no restrictions for this procedure. Please do NOT wear cologne, perfume, aftershave, or lotions (deodorant is allowed). Please arrive 43  minutes prior to your appointment time.    Follow-Up: At [facility], you and your health needs are our priority.   As part of our continuing mission to provide you with exceptional heart care, we have created designated Provider Care Teams.  These Care Teams include your primary Cardiologist (physician) and Advanced Practice Providers (APPs -  Physician Assistants and Nurse Practitioners) who all work together to provide you with the care you need, when you need it.  We recommend signing up for the patient portal called "MyChart".  Sign up information is provided on this After Visit Summary.  MyChart is used to connect with patients for Virtual Visits (Telemedicine).  Patients are able to view lab/test results, encounter notes, upcoming appointments, etc.  Non-urgent messages can be sent to your provider as well.   To learn more about what you can do with MyChart, go to [email or web address].    Your next appointment:   3 month(s)  The format for your next appointment:   In Person  Provider:   Finis Bud, NP    Other Instructions Thank you for choosing [facility]!    Important Information About Sugar         Signed, Finis Bud, NP  08/03/2022 9:21 PM    [place]

## 2022-08-03 NOTE — Patient Instructions (Signed)
Medication Instructions:  Your physician recommends that you continue on your current medications as directed. Please refer to the Current Medication list given to you today.  *If you need a refill on your cardiac medications before your next appointment, please call your pharmacy*   Lab Work: NONE  If you have labs (blood work) drawn today and your tests are completely normal, you will receive your results only by: [place] (if you have MyChart) OR A paper copy in the mail If you have any lab test that is abnormal or we need to change your treatment, we will call you to review the results.   Testing/Procedures: Your physician has requested that you have an echocardiogram. Echocardiography is a painless test that uses sound waves to create images of your heart. It provides your doctor with information about the size and shape of your heart and how well your heart's chambers and valves are working. This procedure takes approximately one hour. There are no restrictions for this procedure. Please do NOT wear cologne, perfume, aftershave, or lotions (deodorant is allowed). Please arrive 15 minutes prior to your appointment time.    Follow-Up: At [facility], you and your health needs are our priority.  As part of our continuing mission to provide you with exceptional heart care, we have created designated Provider Care Teams.  These Care Teams include your primary Cardiologist (physician) and Advanced Practice Providers (APPs -  Physician Assistants and Nurse Practitioners) who all work together to provide you with the care you need, when you need it.  We recommend signing up for the patient portal called "MyChart".  Sign up information is provided on this After Visit Summary.  MyChart is used to connect with patients for Virtual Visits (Telemedicine).  Patients are able to view lab/test results, encounter notes, upcoming appointments, etc.  Non-urgent messages can be sent to  your provider as well.   To learn more about what you can do with MyChart, go to [email or web address].    Your next appointment:   3 month(s)  The format for your next appointment:   In Person  Provider:   Finis Bud, NP    Other Instructions Thank you for choosing [facility]!    Important Information About Sugar

## 2022-08-06 ENCOUNTER — Telehealth: Payer: Self-pay | Admitting: Licensed Clinical Social Worker
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
## 2022-08-10 ENCOUNTER — Encounter: Payer: Self-pay | Admitting: Internal Medicine

## 2022-08-10 DIAGNOSIS — R030 Elevated blood-pressure reading, without diagnosis of hypertension: Secondary | ICD-10-CM | POA: Diagnosis not present

## 2022-08-10 DIAGNOSIS — J40 Bronchitis, not specified as acute or chronic: Secondary | ICD-10-CM | POA: Diagnosis not present

## 2022-08-10 DIAGNOSIS — R059 Cough, unspecified: Secondary | ICD-10-CM | POA: Diagnosis not present

## 2022-08-11 ENCOUNTER — Other Ambulatory Visit: Payer: Medicare Other

## 2022-08-11 ENCOUNTER — Other Ambulatory Visit: Payer: Self-pay | Admitting: Podiatry

## 2022-08-12 NOTE — Telephone Encounter (Signed)
Please advise 

## 2022-08-16 ENCOUNTER — Telehealth: Payer: Self-pay | Admitting: Licensed Clinical Social Worker

## 2022-08-16 NOTE — Telephone Encounter (Signed)
H&V Care Navigation CSW Progress Note  Clinical Social Worker contacted patient by phone to f/u on resources sent. Was able to reach pt at [phone]. Pt confirmed she has received resources- was interested in further discussion but out of the house at this time. Pt requested I give her a call back when she is home tomorrow. I will do so.   Patient is participating in a Managed Medicaid Plan:  No, [employer] Medicare only.   SDOH Screenings   Food Insecurity: No Food Insecurity (08/06/2022)  Housing: Low Risk  (08/06/2022)  Transportation Needs: No Transportation Needs (08/06/2022)  Utilities: Not At Risk (08/06/2022)  Depression (PHQ2-9): Low Risk  (10/09/2018)  Financial Resource Strain: Low Risk  (08/06/2022)  Stress: Stress Concern Present (08/06/2022)  Tobacco Use: High Risk (08/03/2022)    Westley Hummer, MSW, [place]  [phone]- work cell phone (preferred) [phone]- desk phone

## 2022-08-17 ENCOUNTER — Telehealth: Payer: Self-pay | Admitting: Licensed Clinical Social Worker

## 2022-08-17 NOTE — Telephone Encounter (Signed)
H&V Care Navigation CSW Progress Note  Clinical Social Worker contacted patient by phone to f/u as discussed on 1/15. No answer today at [phone], left voicemail requesting call back when able. Will re-attempt.   Patient is participating in a Managed Medicaid Plan:  No, [employer] Medicare  [place]: No Food Insecurity (08/06/2022)  Housing: Low Risk  (08/06/2022)  Transportation Needs: No Transportation Needs (08/06/2022)  Utilities: Not At Risk (08/06/2022)  Depression (PHQ2-9): Low Risk  (10/09/2018)  Financial Resource Strain: Low Risk  (08/06/2022)  Stress: Stress Concern Present (08/06/2022)  Tobacco Use: High Risk (08/03/2022)   Westley Hummer, MSW, [place]  [phone]- work cell phone (preferred) [phone]- desk phone

## 2022-08-18 DIAGNOSIS — L720 Epidermal cyst: Secondary | ICD-10-CM | POA: Diagnosis not present

## 2022-08-18 DIAGNOSIS — D225 Melanocytic nevi of trunk: Secondary | ICD-10-CM | POA: Diagnosis not present

## 2022-08-20 ENCOUNTER — Telehealth: Payer: Self-pay | Admitting: Licensed Clinical Social Worker

## 2022-08-20 NOTE — Telephone Encounter (Signed)
H&V Care Navigation CSW Progress Note  Clinical Social Worker contacted patient by phone to f/u as discussed on 1/15. No answer again today at [phone], left 2nd voicemail requesting call back when able. Will re-attempt.    Patient is participating in a Managed Medicaid Plan:  No, U[employer] Medicare  [place]: No Food Insecurity (08/06/2022)  Housing: Low Risk  (08/06/2022)  Transportation Needs: No Transportation Needs (08/06/2022)  Utilities: Not At Risk (08/06/2022)  Depression (PHQ2-9): Low Risk  (10/09/2018)  Financial Resource Strain: Low Risk  (08/06/2022)  Stress: Stress Concern Present (08/06/2022)  Tobacco Use: High Risk (08/03/2022)    Westley Hummer, MSW, [place]  [phone]- work cell phone (preferred) [phone]- desk phone

## 2022-08-24 ENCOUNTER — Telehealth: Payer: Self-pay | Admitting: Licensed Clinical Social Worker

## 2022-08-24 ENCOUNTER — Ambulatory Visit: Payer: 59 | Attending: Nurse Practitioner

## 2022-08-24 DIAGNOSIS — R0602 Shortness of breath: Secondary | ICD-10-CM | POA: Diagnosis not present

## 2022-08-24 LAB — ECHOCARDIOGRAM COMPLETE
AR max vel: 1.54 cm2
AV Peak grad: 8 mmHg
Ao pk vel: 1.41 m/s
Area-P 1/2: 2.99 cm2
Calc EF: 69.3 %
MV M vel: 1.55 m/s
MV Peak grad: 9.6 mmHg
S' Lateral: 2.3 cm
Single Plane A2C EF: 71.9 %
Single Plane A4C EF: 65.8 %

## 2022-08-24 NOTE — Telephone Encounter (Signed)
H&V Care Navigation CSW Progress Note  Clinical Social Worker contacted patient by phone to f/u again on community resources. No answer again today at [phone], left 3rd voicemail requesting call back when able. I remain available should pt return my call/be interested in re-engaging in care navigation support.   Patient is participating in a Managed Medicaid Plan:  No, U[employer] Medicare only.   SDOH Screenings   Food Insecurity: No Food Insecurity (08/06/2022)  Housing: Low Risk  (08/06/2022)  Transportation Needs: No Transportation Needs (08/06/2022)  Utilities: Not At Risk (08/06/2022)  Depression (PHQ2-9): Low Risk  (10/09/2018)  Financial Resource Strain: Low Risk  (08/06/2022)  Stress: Stress Concern Present (08/06/2022)  Tobacco Use: High Risk (08/03/2022)   Westley Hummer, MSW, [place]  [phone]- work cell phone (preferred) [phone]- desk phone

## 2022-08-30 ENCOUNTER — Telehealth: Payer: Self-pay

## 2022-08-30 NOTE — Telephone Encounter (Signed)
My chart message sent to pt.

## 2022-08-30 NOTE — Telephone Encounter (Signed)
Pt called requesting samples of Omni pods. Pt had some procedures that required her to remove her pods early and she has run out. It is too early for her to get a refill through her insurance.

## 2022-08-30 NOTE — Telephone Encounter (Signed)
These pods are prescriptions and they do not give samples

## 2022-08-31 ENCOUNTER — Encounter: Payer: Self-pay | Admitting: *Deleted

## 2022-09-01 ENCOUNTER — Telehealth: Payer: Self-pay | Admitting: Cardiology

## 2022-09-01 NOTE — Telephone Encounter (Signed)
Says she was unaware that she had a diagnosis of HTN> Explained that hypertension diagnosis has been in her chart as far back as 2021. Advised that she could discuss this with the provider at her next visit. Verbalized understanding.

## 2022-09-01 NOTE — Telephone Encounter (Signed)
Patient is following up. See recent patient message. She mentions having concerns due to never having had a history of high blood pressure. She states she was advised that her results were relatively normal due to history of high blood pressure. Please advise.

## 2022-09-09 ENCOUNTER — Other Ambulatory Visit (HOSPITAL_BASED_OUTPATIENT_CLINIC_OR_DEPARTMENT_OTHER): Payer: Self-pay

## 2022-09-09 DIAGNOSIS — G4710 Hypersomnia, unspecified: Secondary | ICD-10-CM

## 2022-09-09 DIAGNOSIS — R5383 Other fatigue: Secondary | ICD-10-CM

## 2022-09-16 DIAGNOSIS — R030 Elevated blood-pressure reading, without diagnosis of hypertension: Secondary | ICD-10-CM | POA: Diagnosis not present

## 2022-09-16 DIAGNOSIS — J019 Acute sinusitis, unspecified: Secondary | ICD-10-CM | POA: Diagnosis not present

## 2022-09-16 DIAGNOSIS — F17210 Nicotine dependence, cigarettes, uncomplicated: Secondary | ICD-10-CM | POA: Diagnosis not present

## 2022-09-16 DIAGNOSIS — R110 Nausea: Secondary | ICD-10-CM | POA: Diagnosis not present

## 2022-09-21 DIAGNOSIS — L720 Epidermal cyst: Secondary | ICD-10-CM | POA: Diagnosis not present

## 2022-09-28 ENCOUNTER — Encounter: Payer: 59 | Admitting: Pulmonary Disease

## 2022-09-29 ENCOUNTER — Encounter: Payer: Medicare Other | Admitting: Podiatry

## 2022-10-04 ENCOUNTER — Encounter (HOSPITAL_COMMUNITY): Payer: Self-pay | Admitting: Unknown Physician Specialty

## 2022-10-06 ENCOUNTER — Ambulatory Visit (INDEPENDENT_AMBULATORY_CARE_PROVIDER_SITE_OTHER): Payer: 59 | Admitting: Podiatry

## 2022-10-06 ENCOUNTER — Other Ambulatory Visit (HOSPITAL_COMMUNITY): Payer: Self-pay | Admitting: Unknown Physician Specialty

## 2022-10-06 DIAGNOSIS — R921 Mammographic calcification found on diagnostic imaging of breast: Secondary | ICD-10-CM

## 2022-10-06 DIAGNOSIS — M7741 Metatarsalgia, right foot: Secondary | ICD-10-CM | POA: Diagnosis not present

## 2022-10-06 NOTE — Progress Notes (Signed)
Chief Complaint  Patient presents with   Routine Post Op    DOS 05/20/2022 [place] OSTEOTOMY 3RD METATARSAL & POSS 4TH METATARSAL RT, patient states that she has no pain.    Subjective:  Patient presents today status post Weil shortening osteotomy of the third and fourth metatarsals. DOS: 05/20/2022.  Patient states that she is doing very well and she has no pain to the foot.  She is wearing good supportive tennis shoes and sneakers with arch supports.  No new complaints at this time  Past Medical History:  Diagnosis Date   Benign paroxysmal vertigo    Cervicalgia    Depression    Diabetes mellitus ([place])    GERD (gastroesophageal reflux disease)    Hiatal hernia    History of cardiac catheterization    Normal coronary arteries 2016 and 2021   History of TIA (transient ischemic attack)    Hyperlipidemia    Hypertension    IBS (irritable bowel syndrome)    Migraine headache    Panic disorder with agoraphobia    Peripheral neuropathy    Subungual hematoma of digit of hand    Tendonitis    Vertigo     Past Surgical History:  Procedure Laterality Date   ABDOMINAL HYSTERECTOMY     APPENDECTOMY     CARDIAC CATHETERIZATION   last in 2009   x 4, normal coronary arteries   CARDIAC CATHETERIZATION N/A 03/26/2015   Procedure: Left Heart Cath and Coronary Angiography;  Surgeon: Wellington Hampshire, MD;  Location: [facility];  Service: Cardiovascular;  Laterality: N/A;   CARPAL TUNNEL RELEASE Right 08/24/2018   Procedure: CARPAL TUNNEL RELEASE;  Surgeon: Carole Civil, MD;  [facility]: [facility];  Service: Orthopedics;  Laterality: Right;   CESAREAN SECTION     CHOLECYSTECTOMY     COLONOSCOPY WITH PROPOFOL N/A 01/09/2018   Procedure: COLONOSCOPY WITH PROPOFOL;  Surgeon: Rogene Houston, MD;  [facility];  Service: Endoscopy;  Laterality: N/A;   COLONOSCOPY WITH PROPOFOL N/A 09/21/2019   Procedure: COLONOSCOPY WITH PROPOFOL;  Surgeon: Rogene Houston, MD;   [facility];  Service: Endoscopy;  Laterality: N/A;  1055   CYST EXCISION     right breast   ESOPHAGOGASTRODUODENOSCOPY (EGD) WITH PROPOFOL N/A 01/09/2018   Procedure: ESOPHAGOGASTRODUODENOSCOPY (EGD) WITH PROPOFOL;  Surgeon: Rogene Houston, MD;  [facility];  Service: Endoscopy;  Laterality: N/A;   LEFT HEART CATH AND CORONARY ANGIOGRAPHY N/A 02/25/2020   Procedure: LEFT HEART CATH AND CORONARY ANGIOGRAPHY;  Surgeon: Troy Sine, MD;  Location: [facility];  Service: Cardiovascular;  Laterality: N/A;   POLYPECTOMY  09/21/2019   Procedure: POLYPECTOMY;  Surgeon: Rogene Houston, MD;  [facility];  Service: Endoscopy;;  ascending colon;   TOE SURGERY Right    3 rd and 4 th toes   TOOTH EXTRACTION Bilateral 02/16/2018   Procedure: 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 No Did it involve sudden or severe  rash/hives, skin peeling, or any reaction on the inside of your mouth or nose? Yes Did you need to seek medical attention at a hospital or doctor's office? Yes When did it last happen?      30 years If all above answers are "NO", may proceed with cephalosporin use.    Sulfa Antibiotics Rash         Objective/Physical Exam Neurovascular status intact.  Skin incisions nicely healed.  No pain on palpation to the lesser MTPs or the ball of the foot.  Toes are in rectus alignment.  Well-healed surgical foot  Radiographic Exam RT foot 07/28/2022:  Orthopedic hardware and osteotomies sites appear to be stable with  routine healing.  Absence of the second metatarsal head noted.  Appropriate shortening of the third and fourth metatarsals to recreate the metatarsal parabola noted.  Assessment: 1. s/p Weil shortening osteotomies third and fourth metatarsals right foot. DOS: 05/20/2022 2. Prior history of second metatarsal head resection RT   Plan of Care:  1. Patient was evaluated.   2.  Overall the patient is doing very well and caring about her normal daily activities and tissues with no pain. 3.  Resume full activity no restrictions 4.  Return to clinic as needed   Edrick Kins, DPM [facility]  Dr. Edrick Kins, DPM    [address], [place] [address]                Office [phone]  Fax [phone]

## 2022-10-07 DIAGNOSIS — R030 Elevated blood-pressure reading, without diagnosis of hypertension: Secondary | ICD-10-CM | POA: Diagnosis not present

## 2022-10-07 DIAGNOSIS — R5382 Chronic fatigue, unspecified: Secondary | ICD-10-CM | POA: Diagnosis not present

## 2022-10-07 DIAGNOSIS — F17210 Nicotine dependence, cigarettes, uncomplicated: Secondary | ICD-10-CM | POA: Diagnosis not present

## 2022-10-07 DIAGNOSIS — E1065 Type 1 diabetes mellitus with hyperglycemia: Secondary | ICD-10-CM | POA: Diagnosis not present

## 2022-10-26 ENCOUNTER — Other Ambulatory Visit: Payer: Self-pay | Admitting: *Deleted

## 2022-10-26 DIAGNOSIS — E1042 Type 1 diabetes mellitus with diabetic polyneuropathy: Secondary | ICD-10-CM

## 2022-10-26 MED ORDER — OMNIPOD 5 DEXG7G6 PODS GEN 5 MISC: 1.0000 | 3 refills | Status: DC

## 2022-11-02 ENCOUNTER — Encounter: Payer: Self-pay | Admitting: Nurse Practitioner

## 2022-11-02 ENCOUNTER — Ambulatory Visit: Payer: 59 | Attending: Nurse Practitioner | Admitting: Nurse Practitioner

## 2022-11-02 VITALS — BP 118/70 | HR 81 | Ht 67.0 in | Wt 154.6 lb

## 2022-11-02 DIAGNOSIS — E785 Hyperlipidemia, unspecified: Secondary | ICD-10-CM

## 2022-11-02 DIAGNOSIS — F439 Reaction to severe stress, unspecified: Secondary | ICD-10-CM

## 2022-11-02 DIAGNOSIS — I10 Essential (primary) hypertension: Secondary | ICD-10-CM | POA: Diagnosis not present

## 2022-11-02 MED ORDER — NITROGLYCERIN 0.4 MG SL SUBL: 0.4000 mg | SUBLINGUAL_TABLET | SUBLINGUAL | 3 refills | Status: AC | PRN

## 2022-11-02 NOTE — Patient Instructions (Signed)

## 2022-11-02 NOTE — Progress Notes (Signed)
Cardiology Office Note:    Date:  11/02/2022  ID:  [name], DOB 11-18-1970, MRN [id number]  PCP:  Denny Levy, [place] Prov[facility] Cardiologist:  Rozann Lesches, MD     Referring MD: Denny Levy, [place]   CC: Here for follow-up  History of Present Illness:    Jehilyn Mckenzie is a very pleasant 52 y.o. female with a hx of the following:  HTN HLD T2DM Vertigo GERD Hx of TIA  Previous CV history includes normal, low risk NST in 2021.  TTE in 2021 revealed normal EF.  Underwent cardiac catheterization in 2021 that revealed normal coronary arteries, normal EF, LVEDP 17 mmHg.  It was suggested that patient's chest pain was most likely noncardiac in etiology.   Seen by Dr. Domenic Polite on July 23, 2021. Patient noted occasional episodes of left arm numbness and sometimes associated chest discomfort. Did not seem to be related to exertion, possibly stress related.   Last saw her on 08/03/2022. Noted occasional episodes of chest pain. Described as sharp, brief in duration, and sometimes radiated. Noticed a few times per month, attributed this to stress.  Noted ongoing shortness of breath since July 2023, associated symptom included fatigue. Updated TTE noted below.  Today she presents for follow-up. Doing well. Denies any chest pain, shortness of breath, palpitations, syncope, presyncope, dizziness, orthopnea, PND, swelling or significant weight changes, acute bleeding, or claudication. Stress is better than previous visit.   Past Medical History:  Diagnosis Date   Benign paroxysmal vertigo    Cervicalgia    Depression    Diabetes mellitus    GERD (gastroesophageal reflux disease)    Hiatal hernia    History of cardiac catheterization    Normal coronary arteries 2016 and 2021   History of TIA (transient ischemic attack)    Hyperlipidemia    Hypertension    IBS (irritable bowel syndrome)    Migraine headache    Panic disorder with agoraphobia     Peripheral neuropathy    Subungual hematoma of digit of hand    Tendonitis    Vertigo     Past Surgical History:  Procedure Laterality Date   ABDOMINAL HYSTERECTOMY     APPENDECTOMY     CARDIAC CATHETERIZATION   last in 2009   x 4, normal coronary arteries   CARDIAC CATHETERIZATION N/A 03/26/2015   Procedure: Left Heart Cath and Coronary Angiography;  Surgeon: Wellington Hampshire, MD;  Location: [facility];  Service: Cardiovascular;  Laterality: N/A;   CARPAL TUNNEL RELEASE Right 08/24/2018   Procedure: CARPAL TUNNEL RELEASE;  Surgeon: Carole Civil, MD;  [facility];  Service: Orthopedics;  Laterality: Right;   CESAREAN SECTION     CHOLECYSTECTOMY     COLONOSCOPY WITH PROPOFOL N/A 01/09/2018   Procedure: COLONOSCOPY WITH PROPOFOL;  Surgeon: Rogene Houston, MD;  [facility];  Service: Endoscopy;  Laterality: N/A;   COLONOSCOPY WITH PROPOFOL N/A 09/21/2019   Procedure: COLONOSCOPY WITH PROPOFOL;  Surgeon: Rogene Houston, MD;  [facility];  Service: Endoscopy;  Laterality: N/A;  1055   CYST EXCISION     right breast   ESOPHAGOGASTRODUODENOSCOPY (EGD) WITH PROPOFOL N/A 01/09/2018   Procedure: ESOPHAGOGASTRODUODENOSCOPY (EGD) WITH PROPOFOL;  Surgeon: Rogene Houston, MD;  [facility];  Service: Endoscopy;  Laterality: N/A;   LEFT HEART CATH AND CORONARY ANGIOGRAPHY N/A 02/25/2020   Procedure: LEFT HEART CATH AND CORONARY ANGIOGRAPHY;  Surgeon: Claiborne Billings,  Joyice Faster, MD;  Location: [facility];  Service: Cardiovascular;  Laterality: N/A;   POLYPECTOMY  09/21/2019   Procedure: POLYPECTOMY;  Surgeon: Rogene Houston, MD;  [facility];  Service: Endoscopy;;  ascending colon;   TOE SURGERY Right    3 rd and 4 th toes   TOOTH EXTRACTION Bilateral 02/16/2018   Procedure: 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   Continuous Blood Gluc Transmit (DEXCOM G6 TRANSMITTER) MISC Use as directed change every 90 days.   diazepam (VALIUM) 5 MG tablet Take 5 mg by mouth 3 (three) times daily as needed for anxiety.    docusate sodium (COLACE) 100 MG capsule Take 100-200 mg by mouth See admin instructions. Take 100 mg by mouth in the morning and 200 mg in the evening   EPIPEN 2-PAK 0.3 MG/0.3ML SOAJ injection Inject 0.3 mg into the muscle as needed for anaphylaxis.    estradiol (ESTRACE) 1 MG tablet Take 1 mg by mouth daily.   Glucagon 3 MG/DOSE POWD Place 3 mg into the nose once as needed for up to 1 dose.   ibuprofen (ADVIL) 800 MG tablet TAKE 1 TABLET BY MOUTH 3 TIMES DAILY. (Patient taking differently: Take 800 mg by mouth as needed.)   Insulin Disposable Pump (OMNIPOD 5 G6 PODS, GEN 5,) MISC 1 applicator by Does not apply route every 3 (three) days.   lisinopril (PRINIVIL,ZESTRIL) 5 MG tablet Take 5 mg by mouth daily.   nitroGLYCERIN (NITROSTAT) 0.4 MG SL tablet Place 1 tablet (0.4 mg total) under the tongue every 5 (five) minutes x 3 doses as needed for chest pain (if no relief after 3rd dose, proceed to the ED for an evaluation or call 911).   pravastatin (PRAVACHOL) 20 MG tablet Take 1 tablet (20 mg total) by mouth at bedtime.   promethazine (PHENERGAN) 25 MG tablet Take 1 tablet (25 mg total) by mouth every 6 (six) hours as needed for nausea or vomiting.     Allergies:   Bee venom, Zocor [simvastatin], Clindamycin/lincomycin, Codeine, Penicillins, and Sulfa antibiotics   Social History   Socioeconomic History   Marital status: Legally Separated    Spouse name: Not on file   Number of children: Not on file   Years of education: Not on file   Highest education level: Not on file  Occupational History   Not on file  Tobacco Use    Smoking status: Every Day    Packs/day: 0.75    Years: 11.00    Additional pack years: 0.00    Total pack years: 8.25    Types: Cigarettes    Passive exposure: Never   Smokeless tobacco: Never   Tobacco comments:    10 ciggs per day   Vaping Use   Vaping Use: Never used  Substance and Sexual Activity   Alcohol use: Not Currently    Comment: occasionally   Drug use: No   Sexual activity: Not on file  Other Topics Concern   Not on file  Social History Narrative   Right handed   Caffeine~4-5 cups per day (diet sun drop)   Lives at home alone    Social Determinants of Health  Financial Resource Strain: Low Risk  (08/06/2022)   Overall Financial Resource Strain (CARDIA)    Difficulty of Paying Living Expenses: Not very hard  Food Insecurity: No Food Insecurity (08/06/2022)   Hunger Vital Sign    Worried About Running Out of Food in the Last Year: Never true    Ran Out of Food in the Last Year: Never true  Transportation Needs: No Transportation Needs (08/06/2022)   PRAPARE - Hydrologist (Medical): No    Lack of Transportation (Non-Medical): No  Physical Activity: Not on file  Stress: Stress Concern Present (08/06/2022)   [place]    Feeling of Stress : To some extent  Social Connections: Not on file     Family History: The patient's family history includes Cancer in her maternal grandfather and sister; Heart attack (age of onset: 25) in her mother; Heart failure (age of onset: 71) in her paternal grandfather; Heart failure (age of onset: 60) in her maternal grandmother; Heart failure (age of onset: 41) in her paternal grandmother; Stroke (age of onset: 48) in her mother.  ROS:    Please see the history of present illness.    All other systems reviewed and are negative.  EKGs/Labs/Other Studies Reviewed:    The following studies were reviewed today:   EKG:  EKG is not ordered  today.  TTE 08/2022: - see report  Left heart cath on 02/25/2020: The left ventricular systolic function is normal. LV end diastolic pressure is normal.   Normal epicardial coronary arteries in a dominant left coronary system.   Hyperdynamic LV function with EF at 65%; LVEDP 17 mmHg.   RECOMMENDATION: The patient's chest pain most likely is noncardiac in etiology.  Optimal blood pressure control with ideal blood pressure less than 120/80 aggressive lipid-lowering therapy in this diabetic female with target LDL less than 70.  Smoking cessation is essential.  Echocardiogram on 11/29/2019: 1. Left ventricular ejection fraction, by estimation, is 60 to 65%. The  left ventricle has normal function. The left ventricle has no regional  wall motion abnormalities. Left ventricular diastolic parameters were  normal.   2. Right ventricular systolic function is normal. The right ventricular  size is normal.   3. The mitral valve is normal in structure. No evidence of mitral valve  regurgitation. No evidence of mitral stenosis.   4. The aortic valve is tricuspid. Aortic valve regurgitation is not  visualized. No aortic stenosis is present.   5. The inferior vena cava is normal in size with greater than 50%  respiratory variability, suggesting right atrial pressure of 3 mmHg.  Myoview on 11/29/2019: There was no ST segment deviation noted during stress. The study is normal. There are no perfusion defects This is a low risk study. The left ventricular ejection fraction is normal (55-65%).  Recent Labs: 03/16/2022: BUN 9; Creatinine, Ser 0.84; Potassium 4.1; Sodium 137 04/02/2022: Hemoglobin 14.8; Platelets 188  Recent Lipid Panel    Component Value Date/Time   CHOL 162 10/06/2021 1407   TRIG 121.0 10/06/2021 1407   HDL 59.10 10/06/2021 1407   CHOLHDL 3 10/06/2021 1407   VLDL 24.2 10/06/2021 1407   LDLCALC 79 10/06/2021 1407    Physical Exam:    VS:  BP 118/70 (BP Location: Right Arm,  Patient Position: Sitting, Cuff Size: Normal)   Pulse 81   Ht 5\' 7"  (1.702 m)   Wt 154 lb 9.6 oz (70.1 kg)  SpO2 94%   BMI 24.21 kg/m     Wt Readings from Last 3 Encounters:  11/02/22 154 lb 9.6 oz (70.1 kg)  08/03/22 165 lb 3.2 oz (74.9 kg)  04/29/22 173 lb 4.8 oz (78.6 kg)     GEN: Well nourished, well developed in no acute distress HEENT: Normal NECK: No JVD; No carotid bruits CARDIAC: S1/S2, RRR, no murmurs, rubs, gallops; 2+ peripheral pulses throughout, strong and equal bilaterally RESPIRATORY:  Clear to auscultation without rales, wheezing or rhonchi  MUSCULOSKELETAL:  No edema; No deformity  SKIN: Warm and dry NEUROLOGIC:  Alert and oriented x 3 PSYCHIATRIC:  Normal affect   ASSESSMENT:    1. Hypertension, unspecified type   2. Hyperlipidemia, unspecified hyperlipidemia type   3. Stress     PLAN:    In order of problems listed above:  HTN BP stable today. BP well controlled at home. Continue current medication regimen. Heart healthy diet and regular cardiovascular exercise encouraged.   HLD Last lipid panel WNL. Continue pravastatin. Heart healthy diet and regular cardiovascular exercise encouraged.   Stress Improvement since last OV, previously referred to LCSW. Denied any current abuse, SI, or HI. Continue to follow with PCP. Heart healthy diet and regular cardiovascular exercise encouraged.   6. Disposition: Will refill NG per her request. Follow-up with Dr. Domenic Polite or APP in 6 months or sooner if anything changes.    Medication Adjustments/Labs and Tests Ordered: Current medicines are reviewed at length with the patient today.  Concerns regarding medicines are outlined above.  No orders of the defined types were placed in this encounter.  Meds ordered this encounter  Medications   nitroGLYCERIN (NITROSTAT) 0.4 MG SL tablet    Sig: Place 1 tablet (0.4 mg total) under the tongue every 5 (five) minutes x 3 doses as needed for chest pain (if no relief  after 3rd dose, proceed to the ED for an evaluation or call 911).    Dispense:  25 tablet    Refill:  3    Patient Instructions  Medication Instructions:  Your physician recommends that you continue on your current medications as directed. Please refer to the Current Medication list given to you today.   Labwork: none  Testing/Procedures: none  Follow-Up:  Your physician recommends that you schedule a follow-up appointment in: 6 months  Any Other Special Instructions Will Be Listed Below (If Applicable).  If you need a refill on your cardiac medications before your next appointment, please call your pharmacy.    SignedFinis Bud, NP  11/03/2022 8:30 PM    [place]

## 2022-11-09 ENCOUNTER — Ambulatory Visit (HOSPITAL_COMMUNITY): Payer: 59

## 2022-11-09 ENCOUNTER — Encounter (HOSPITAL_COMMUNITY): Payer: 59

## 2022-11-17 DIAGNOSIS — J019 Acute sinusitis, unspecified: Secondary | ICD-10-CM | POA: Diagnosis not present

## 2022-11-17 DIAGNOSIS — R030 Elevated blood-pressure reading, without diagnosis of hypertension: Secondary | ICD-10-CM | POA: Diagnosis not present

## 2022-11-17 DIAGNOSIS — J029 Acute pharyngitis, unspecified: Secondary | ICD-10-CM | POA: Diagnosis not present

## 2022-11-17 DIAGNOSIS — Z6823 Body mass index (BMI) 23.0-23.9, adult: Secondary | ICD-10-CM | POA: Diagnosis not present

## 2022-11-23 ENCOUNTER — Encounter (HOSPITAL_COMMUNITY): Payer: Self-pay

## 2022-11-23 ENCOUNTER — Ambulatory Visit (HOSPITAL_COMMUNITY)
Admission: RE | Admit: 2022-11-23 | Discharge: 2022-11-23 | Disposition: A | Discharge status: Home / Self Care | Payer: 59 | Source: Ambulatory Visit | Attending: Unknown Physician Specialty | Admitting: Unknown Physician Specialty

## 2022-11-23 DIAGNOSIS — R921 Mammographic calcification found on diagnostic imaging of breast: Secondary | ICD-10-CM | POA: Diagnosis not present

## 2022-11-24 ENCOUNTER — Other Ambulatory Visit (HOSPITAL_COMMUNITY): Payer: Self-pay | Admitting: Unknown Physician Specialty

## 2022-11-24 DIAGNOSIS — R928 Other abnormal and inconclusive findings on diagnostic imaging of breast: Secondary | ICD-10-CM

## 2022-11-24 DIAGNOSIS — R921 Mammographic calcification found on diagnostic imaging of breast: Secondary | ICD-10-CM

## 2022-11-25 ENCOUNTER — Ambulatory Visit (INDEPENDENT_AMBULATORY_CARE_PROVIDER_SITE_OTHER): Payer: 59 | Admitting: Internal Medicine

## 2022-11-25 ENCOUNTER — Encounter: Payer: Self-pay | Admitting: Internal Medicine

## 2022-11-25 VITALS — BP 132/76 | HR 71 | Ht 67.0 in | Wt 150.0 lb

## 2022-11-25 DIAGNOSIS — E782 Mixed hyperlipidemia: Secondary | ICD-10-CM

## 2022-11-25 DIAGNOSIS — E1042 Type 1 diabetes mellitus with diabetic polyneuropathy: Secondary | ICD-10-CM

## 2022-11-25 DIAGNOSIS — E1065 Type 1 diabetes mellitus with hyperglycemia: Secondary | ICD-10-CM | POA: Diagnosis not present

## 2022-11-25 DIAGNOSIS — R7989 Other specified abnormal findings of blood chemistry: Secondary | ICD-10-CM

## 2022-11-25 LAB — POCT GLYCOSYLATED HEMOGLOBIN (HGB A1C): Hemoglobin A1C: 9 % — AB (ref 4.0–5.6)

## 2022-11-25 NOTE — Progress Notes (Signed)
Patient ID: [name], female   DOB: 02-25-1971, 52 y.o.   MRN: [id number]  HPI: Kathe Wirick is a 52 y.o.-year-old female, returning for f/u for DM1, dx in 41 (52 y/o), uncontrolled, with complications (cerebro-vascular ds - h/o TIA 0/2016, PN, DR). Last visit 5 months ago.  She is here with her husband. Insurance: [employer].  Interim history: No increased urination, blurry vision, chest pain.   She continues to have numbness and tingling in her feet. She lost almost 40 lbs in last year.  She mentions that this was unintentional.  No nausea or diarrhea.  She did stop metformin approximately 1 month ago to prevent further weight loss She will need to have a R breast Bx soon. He has sinus congestion and cough. She continues to smoke but trying to quit.  Insulin pump: -Previously OmniPod -Then Medtronic 670 G insulin pump but could not afford the Enlite CGM -then T: slim X2-started 10/2019 -now Omnipod5 - started 06/30/2021 - likes this better (changed 10/2022).  She is using NovoLog FlexPen the reservoir.  She prefers this.  CGM: -Dexcom G6  Insulin: -Sugars improved after her insurance switched coverage from Humalog to NovoLog  Supplies:  -[employer]  Reviewed HbA1c levels: Lab Results  Component Value Date   HGBA1C 8.7 (A) 06/14/2022   HGBA1C 9.5 (A) 09/14/2021   HGBA1C 9.5 (A) 02/16/2021   Previously on: - Lantus 25 units in am and 10 units at night - Novolog ICR 1:17, target 150, ISF 1:25  Now on insulin pump: Pump settings:  - basal rates: 12 am: 0.6 >> 0.9 >> 0.6 (unintentionally entered a lower basal rate when she got the new OmniPod) - ICR:              12 am: 1:14 >> 1:11 - target: 70-110 >> 110-150 >> 120-130 - ISF: 50 - Active insulin time: 4 hours  She was also on: - Metformin 500 mg 2x a day - started 09/2021 -stopped after she started to be more fatigued, but she realized that this was not really related to metformin. She stopped Metformin  10/2022 2/2 weight loss.  She checks her sugars more than 4 times a day with her CGM:  Prev.:  Previously:   Lowest sugar was 27 in 07/2014...>> 30s (!) - sensor pb. >> 60s >> 50s. No hypoglycemia awareness!  She has a glucagon pen at home. Highest sugar was 601 .. >> 1200 (!!!) ...>> 296 >> 400 >> 500 (infusion site pb) >> 400s (Covid19) >> 400 >> 300s. No history of hypoglycemia admissions.   She  had hyperglycemia ED visits and an admission in 12/2017. She had a DKA admission in 09/2018 - with suspicion of stroke.  This was ruled out by MRI and she also had normal carotid Dopplers afterwards.   -No CKD, last BUN/creatinine:  Lab Results  Component Value Date   BUN 9 03/16/2022   CREATININE 0.84 03/16/2022  On Lisinopril 5 mg daily.  -+ HL; last set of lipids: Lab Results  Component Value Date   CHOL 162 10/06/2021   HDL 59.10 10/06/2021   LDLCALC 79 10/06/2021   TRIG 121.0 10/06/2021   CHOLHDL 3 10/06/2021  On pravastatin 20, fish oil 1200 mg daily.  - last eye exam was in 2024: + DR.  -+ Numbness and tingling in feet and hands. She was previously on Neurontin but she stopped due to side effects. Currently off Lyrica.  Last foot exam 08/2021.  Reviewed  B12 levels: Lab Results  Component Value Date   VITAMINB12 >1504 (H) 10/06/2021   VITAMINB12 650 10/17/2020   VITAMINB12 >1526 (H) 04/04/2020   VITAMINB12 >1500 (H) 12/26/2018   VITAMINB12 481 12/06/2014  She stopped B12 in 2020.  No history of hypothyroidism: Lab Results  Component Value Date   TSH 2.82 10/06/2021   On Diazepam. Stopped Wellbutrin due to constipation. She had tooth surgery in 01/2018, Dr. Teena Dunk.   She had bilateral big toe surgery in 09/2016 with Dr. Logan Bores.  ROS: + See HPI  I reviewed pt's medications, allergies, PMH, social hx, family hx, and changes were documented in the history of present illness. Otherwise, unchanged from my initial visit note.  Past Medical History:  Diagnosis  Date   Benign paroxysmal vertigo    Cervicalgia    Depression    Diabetes mellitus    GERD (gastroesophageal reflux disease)    Hiatal hernia    History of cardiac catheterization    Normal coronary arteries 2016 and 2021   History of TIA (transient ischemic attack)    Hyperlipidemia    Hypertension    IBS (irritable bowel syndrome)    Migraine headache    Panic disorder with agoraphobia    Peripheral neuropathy    Subungual hematoma of digit of hand    Tendonitis    Vertigo    Past Surgical History:  Procedure Laterality Date   ABDOMINAL HYSTERECTOMY     APPENDECTOMY     CARDIAC CATHETERIZATION   last in 2009   x 4, normal coronary arteries   CARDIAC CATHETERIZATION N/A 03/26/2015   Procedure: Left Heart Cath and Coronary Angiography;  Surgeon: Iran Ouch, MD;  Location: [facility];  Service: Cardiovascular;  Laterality: N/A;   CARPAL TUNNEL RELEASE Right 08/24/2018   Procedure: CARPAL TUNNEL RELEASE;  Surgeon: Vickki Hearing, MD;  Location: [facility];  Service: Orthopedics;  Laterality: Right;   CESAREAN SECTION     CHOLECYSTECTOMY     COLONOSCOPY WITH PROPOFOL N/A 01/09/2018   Procedure: COLONOSCOPY WITH PROPOFOL;  Surgeon: Malissa Hippo, MD;  Location: [facility];  Service: Endoscopy;  Laterality: N/A;   COLONOSCOPY WITH PROPOFOL N/A 09/21/2019   Procedure: COLONOSCOPY WITH PROPOFOL;  Surgeon: Malissa Hippo, MD;  Location: [facility];  Service: Endoscopy;  Laterality: N/A;  1055   CYST EXCISION     right breast   ESOPHAGOGASTRODUODENOSCOPY (EGD) WITH PROPOFOL N/A 01/09/2018   Procedure: ESOPHAGOGASTRODUODENOSCOPY (EGD) WITH PROPOFOL;  Surgeon: Malissa Hippo, MD;  Location: [facility];  Service: Endoscopy;  Laterality: N/A;   LEFT HEART CATH AND CORONARY ANGIOGRAPHY N/A 02/25/2020   Procedure: LEFT HEART CATH AND CORONARY ANGIOGRAPHY;  Surgeon: Lennette Bihari, MD;  Location: [facility];  Service: Cardiovascular;  Laterality: N/A;    POLYPECTOMY  09/21/2019   Procedure: POLYPECTOMY;  Surgeon: Malissa Hippo, MD;  Location: [facility];  Service: Endoscopy;;  ascending colon;   TOE SURGERY Right    3 rd and 4 th toes   TOOTH EXTRACTION Bilateral 02/16/2018   Procedure: 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 RIGHT MAXILLARY SINUS ANTROSTOMY;  Surgeon: Ocie Doyne, DDS;  Location: [facility];  Service: Oral Surgery;  Laterality: Bilateral;   TUBAL LIGATION     History   Social History   Marital Status: Divorced    Spouse Name: N/A   Number of Children: 1   Occupational History   disabled  Social History Main Topics   Smoking status: Current Every Day Smoker -- 0.50 packs/day for 11 years   Smokeless tobacco: Never Used     Comment: patient is aware that she needs to quit smoking   Alcohol Use: No   Drug Use: No   Current Outpatient Medications on File Prior to Visit  Medication Sig Dispense Refill   citalopram (CELEXA) 20 MG tablet Take 20 mg by mouth daily.     Continuous Blood Gluc Transmit (DEXCOM G6 TRANSMITTER) MISC Use as directed change every 90 days. 1 each 3   diazepam (VALIUM) 5 MG tablet Take 5 mg by mouth 3 (three) times daily as needed for anxiety.      docusate sodium (COLACE) 100 MG capsule Take 100-200 mg by mouth See admin instructions. Take 100 mg by mouth in the morning and 200 mg in the evening     EPIPEN 2-PAK 0.3 MG/0.3ML SOAJ injection Inject 0.3 mg into the muscle as needed for anaphylaxis.      estradiol (ESTRACE) 1 MG tablet Take 1 mg by mouth daily.     Glucagon 3 MG/DOSE POWD Place 3 mg into the nose once as needed for up to 1 dose. 1 each 11   ibuprofen (ADVIL) 800 MG tablet TAKE 1 TABLET BY MOUTH 3 TIMES DAILY. (Patient taking differently: Take 800 mg by mouth as needed.) 90 tablet 0   Insulin Aspart FlexPen (NOVOLOG) 100 UNIT/ML INJECT UP TO 60 UNITS IN THE PUMP DAILY. (Patient not taking: Reported on 08/03/2022) 60 mL 3   Insulin Disposable Pump (OMNIPOD 5 G6 PODS,  GEN 5,) MISC 1 applicator by Does not apply route every 3 (three) days. 30 each 3   lisinopril (PRINIVIL,ZESTRIL) 5 MG tablet Take 5 mg by mouth daily.  2   metFORMIN (GLUCOPHAGE) 500 MG tablet Take 1 tablet (500 mg total) by mouth 2 (two) times daily with a meal. (Patient not taking: Reported on 08/03/2022) 180 tablet 3   nitroGLYCERIN (NITROSTAT) 0.4 MG SL tablet Place 1 tablet (0.4 mg total) under the tongue every 5 (five) minutes x 3 doses as needed for chest pain (if no relief after 3rd dose, proceed to the ED for an evaluation or call 911). 25 tablet 3   oxyCODONE-acetaminophen (PERCOCET) 7.5-325 MG tablet Take 1 tablet by mouth every 4 (four) hours as needed for severe pain. (Patient not taking: Reported on 08/03/2022) 30 tablet 0   pravastatin (PRAVACHOL) 20 MG tablet Take 1 tablet (20 mg total) by mouth at bedtime. 90 tablet 3   promethazine (PHENERGAN) 25 MG tablet Take 1 tablet (25 mg total) by mouth every 6 (six) hours as needed for nausea or vomiting. 30 tablet 0   No current facility-administered medications on file prior to visit.   Allergies  Allergen Reactions   Bee Venom Anaphylaxis and Hives   Zocor [Simvastatin] Other (See Comments)    Muscle aches and pains   Clindamycin/Lincomycin Rash   Codeine Rash   Penicillins Rash    Did it involve swelling of the face/tongue/throat, SOB, or low BP? No Did it involve sudden or severe rash/hives, skin peeling, or any reaction on the inside of your mouth or nose? Yes Did you need to seek medical attention at a hospital or doctor's office? Yes When did it last happen?      30 years If all above answers are "NO", may proceed with cephalosporin use.    Sulfa Antibiotics Rash  Family History  Problem Relation Age of Onset   Stroke Mother 63   Heart attack Mother 29   Cancer Sister        colon   Heart failure Maternal Grandmother 71   Cancer Maternal Grandfather        prostate   Heart failure Paternal Grandmother 59    Heart failure Paternal Grandfather 60   PE: BP 132/76 (BP Location: Left Arm, Patient Position: Sitting, Cuff Size: Normal)   Pulse 71   Ht 5\' 7"  (1.702 m)   Wt 150 lb (68 kg)   SpO2 96%   BMI 23.49 kg/m   Wt Readings from Last 3 Encounters:  11/25/22 150 lb (68 kg)  11/02/22 154 lb 9.6 oz (70.1 kg)  08/03/22 165 lb 3.2 oz (74.9 kg)   Constitutional: overweight, in NAD, in wheelchair, right foot in boot Eyes:  EOMI, no exophthalmos ENT: no neck masses, no cervical lymphadenopathy Cardiovascular: RRR, No MRG Respiratory: CTA B Musculoskeletal: no deformities Skin:no rashes Neurological: no tremor with outstretched hands  ASSESSMENT: 1. DM1, uncontrolled, with complications - cerebro-vascular ds - h/o TIA 0/2016 - PN - DR Sees cardiology >> Dr. Dionne Milo - [facility]  2. HL  3.  High B12  PLAN:  1. Patient with longstanding, uncontrolled, type 1 diabetes, on OmniPod 5 insulin, with still suboptimal control.  She was previously on the t:slim X2 insulin pump but she liked the OmniPod better.  She had to change the pump since last visit and when she received a new pump, approximately a month ago, she entered lower basal rates by mistake. -At last visit, HbA1c was better, and sugars were fluctuating mostly within the target range.  She had higher values after meals but she was not entering enough carbs into the pump and we again discussed about the importance of doing so.  We discussed about possibly adding back metformin.  She did so, but she had to stop since last visit (approximately a month ago), due to weight loss. CGM interpretation: -At today's visit, we reviewed her CGM downloads: It appears that 50% of values are in target range (goal >70%), while 50% are higher than 180 (goal <25%), and 0% are lower than 70 (goal <4%).  The calculated average blood sugar is 187.  The projected HbA1c for the next 3 months (GMI) is 7.8%. -Reviewing the CGM trends, sugars appear to be  fluctuating around the upper limit of the target range, with better sugars in the morning but higher blood sugars after meals, as the day went by.  Her highest blood sugars after dinner.  She is still not introducing enough carbs into the pump and we again discussed about doing so.  However, I also feel that her basal rates and insulin to carb ratios are suboptimal for her.  We will increase the basal rates and strengthened her ICR's for the meals.  I explained how by doing so the pump will understand that she needs more insulin per day.  We will not restart metformin for now. - I suggested to:  Patient Instructions  Please use the following pump settings: - basal rates: 12 am: 0.6 >> 0.9 - ICR:              12 am: 1:11 >> 1:9 - target: 120-130  - ISF: 50 - Active insulin time: 4 hours  Please do the following approximately 15 minutes before every meal and snack: - Enter carbs (C) - Enter sugars (S) -  Start insulin bolus (I)  Please return in 4 months.   - we checked her HbA1c: 9% (higher) - advised to check sugars at different times of the day - 4x a day, rotating check times - advised for yearly eye exams >> she is UTD - we will check annual labs today - return to clinic in 4 months  2. HL -Reviewed the latest lipid panel from 09/2021: Fractions at goal: Lab Results  Component Value Date   CHOL 162 10/06/2021   HDL 59.10 10/06/2021   LDLCALC 79 10/06/2021   TRIG 121.0 10/06/2021   CHOLHDL 3 10/06/2021  -Continues pravastatin 20 mg daily without side effects.  - will check her lipid panel today  3.  High B12 -She had a high B12 level in the past on supplements -She came off supplements afterwards. -At last visit, a B12 level was still high: Lab Results  Component Value Date   VITAMINB12 >1504 (H) 10/06/2021  -We will repeat this now  Component     Latest Ref Rng 11/25/2022  Sodium     135 - 145 mEq/L 136   Potassium     3.5 - 5.1 mEq/L 4.2   Chloride     96 - 112  mEq/L 101   CO2     19 - 32 mEq/L 27   Glucose     70 - 99 mg/dL 098 (H)   BUN     6 - 23 mg/dL 6   Creatinine     1.19 - 1.20 mg/dL 1.47   Calcium     8.4 - 10.5 mg/dL 9.3   Total Protein     6.0 - 8.3 g/dL 7.1   Albumin     3.5 - 5.2 g/dL 4.2   AST     0 - 37 U/L 21   ALT     0 - 35 U/L 14   Alkaline Phosphatase     39 - 117 U/L 52   Total Bilirubin     0.2 - 1.2 mg/dL 0.4   Hemoglobin W2N     4.0 - 5.6 % 9.0 !   TSH     0.35 - 5.50 uIU/mL 2.53   Cholesterol     0 - 200 mg/dL 562   Triglycerides     0.0 - 149.0 mg/dL 130.8   HDL Cholesterol     >39.00 mg/dL 65.78   VLDL     0.0 - 40.0 mg/dL 46.9   LDL (calc)     0 - 99 mg/dL 89   Total CHOL/HDL Ratio 3   NonHDL 109.60   Vitamin B12     211 - 911 pg/mL 1,291 (H)   GFR     >60.00 mL/min 71.71   Vitamin B12 is slightly higher, but improved from before.  Will continue off B12 supplements. Glucose and HbA1c levels are high. The rest of the labs are at goal with the exception of a high LDL, which is above our target of less than 55 due to her history of TIA.  I will suggest to increase pravastatin to 40 mg daily.  Carlus Pavlov, MD PhD [facility]

## 2022-11-25 NOTE — Patient Instructions (Addendum)
Please use the following pump settings: - basal rates: 12 am: 0.6 >> 0.9 - ICR:              12 am: 1:11 >> 1:9 - target: 120-130  - ISF: 50 - Active insulin time: 4 hours  Please do the following approximately 15 minutes before every meal and snack: - Enter carbs (C) - Enter sugars (S) - Start insulin bolus (I)  Please return in 4 months.

## 2022-11-26 LAB — MICROALBUMIN / CREATININE URINE RATIO
Creatinine,U: 194 mg/dL
Microalb Creat Ratio: 1 mg/g (ref 0.0–30.0)
Microalb, Ur: 1.9 mg/dL (ref 0.0–1.9)

## 2022-11-26 LAB — LIPID PANEL
Cholesterol: 155 mg/dL (ref 0–200)
HDL: 45.4 mg/dL (ref >39.00)
LDL Cholesterol: 89 mg/dL (ref 0–99)
NonHDL: 109.6
Total CHOL/HDL Ratio: 3
Triglycerides: 105 mg/dL (ref 0.0–149.0)
VLDL: 21 mg/dL (ref 0.0–40.0)

## 2022-11-26 LAB — COMPREHENSIVE METABOLIC PANEL
ALT: 14 U/L (ref 0–35)
AST: 21 U/L (ref 0–37)
Albumin: 4.2 g/dL (ref 3.5–5.2)
Alkaline Phosphatase: 52 U/L (ref 39–117)
BUN: 6 mg/dL (ref 6–23)
CO2: 27 mEq/L (ref 19–32)
Calcium: 9.3 mg/dL (ref 8.4–10.5)
Chloride: 101 mEq/L (ref 96–112)
Creatinine, Ser: 0.92 mg/dL (ref 0.40–1.20)
GFR: 71.71 mL/min (ref >60.00)
Glucose, Bld: 202 mg/dL — ABNORMAL HIGH (ref 70–99)
Potassium: 4.2 mEq/L (ref 3.5–5.1)
Sodium: 136 mEq/L (ref 135–145)
Total Bilirubin: 0.4 mg/dL (ref 0.2–1.2)
Total Protein: 7.1 g/dL (ref 6.0–8.3)

## 2022-11-26 LAB — TSH: TSH: 2.53 u[IU]/mL (ref 0.35–5.50)

## 2022-11-26 LAB — VITAMIN B12: Vitamin B-12: 1291 pg/mL — ABNORMAL HIGH (ref 211–911)

## 2022-11-26 MED ORDER — PRAVASTATIN SODIUM 40 MG PO TABS: 40.0000 mg | ORAL_TABLET | Freq: Every day | ORAL | 3 refills | Status: DC

## 2022-11-29 ENCOUNTER — Encounter: Payer: Self-pay | Admitting: Internal Medicine

## 2022-12-01 ENCOUNTER — Ambulatory Visit
Admission: RE | Admit: 2022-12-01 | Discharge: 2022-12-01 | Disposition: A | Discharge status: Home / Self Care | Payer: 59 | Source: Ambulatory Visit | Attending: Unknown Physician Specialty | Admitting: Unknown Physician Specialty

## 2022-12-01 DIAGNOSIS — R928 Other abnormal and inconclusive findings on diagnostic imaging of breast: Secondary | ICD-10-CM

## 2022-12-01 DIAGNOSIS — N6489 Other specified disorders of breast: Secondary | ICD-10-CM | POA: Diagnosis not present

## 2022-12-01 DIAGNOSIS — R921 Mammographic calcification found on diagnostic imaging of breast: Secondary | ICD-10-CM

## 2022-12-01 DIAGNOSIS — N6311 Unspecified lump in the right breast, upper outer quadrant: Secondary | ICD-10-CM | POA: Diagnosis not present

## 2022-12-01 HISTORY — PX: BREAST BIOPSY: SHX20

## 2023-01-07 DIAGNOSIS — Z6822 Body mass index (BMI) 22.0-22.9, adult: Secondary | ICD-10-CM | POA: Diagnosis not present

## 2023-01-07 DIAGNOSIS — E1065 Type 1 diabetes mellitus with hyperglycemia: Secondary | ICD-10-CM | POA: Diagnosis not present

## 2023-01-07 DIAGNOSIS — R030 Elevated blood-pressure reading, without diagnosis of hypertension: Secondary | ICD-10-CM | POA: Diagnosis not present

## 2023-01-07 DIAGNOSIS — F17210 Nicotine dependence, cigarettes, uncomplicated: Secondary | ICD-10-CM | POA: Diagnosis not present

## 2023-01-07 DIAGNOSIS — R5382 Chronic fatigue, unspecified: Secondary | ICD-10-CM | POA: Diagnosis not present

## 2023-02-01 ENCOUNTER — Other Ambulatory Visit: Payer: Self-pay | Admitting: Internal Medicine

## 2023-02-07 DIAGNOSIS — Z6822 Body mass index (BMI) 22.0-22.9, adult: Secondary | ICD-10-CM | POA: Diagnosis not present

## 2023-02-07 DIAGNOSIS — R110 Nausea: Secondary | ICD-10-CM | POA: Diagnosis not present

## 2023-02-07 DIAGNOSIS — R030 Elevated blood-pressure reading, without diagnosis of hypertension: Secondary | ICD-10-CM | POA: Diagnosis not present

## 2023-02-07 DIAGNOSIS — G43909 Migraine, unspecified, not intractable, without status migrainosus: Secondary | ICD-10-CM | POA: Diagnosis not present

## 2023-02-14 DIAGNOSIS — R42 Dizziness and giddiness: Secondary | ICD-10-CM | POA: Diagnosis not present

## 2023-02-14 DIAGNOSIS — R350 Frequency of micturition: Secondary | ICD-10-CM | POA: Diagnosis not present

## 2023-02-14 DIAGNOSIS — F17210 Nicotine dependence, cigarettes, uncomplicated: Secondary | ICD-10-CM | POA: Diagnosis not present

## 2023-02-14 DIAGNOSIS — Z6822 Body mass index (BMI) 22.0-22.9, adult: Secondary | ICD-10-CM | POA: Diagnosis not present

## 2023-02-14 DIAGNOSIS — I951 Orthostatic hypotension: Secondary | ICD-10-CM | POA: Diagnosis not present

## 2023-02-14 DIAGNOSIS — R030 Elevated blood-pressure reading, without diagnosis of hypertension: Secondary | ICD-10-CM | POA: Diagnosis not present

## 2023-02-18 DIAGNOSIS — E1065 Type 1 diabetes mellitus with hyperglycemia: Secondary | ICD-10-CM | POA: Diagnosis not present

## 2023-02-18 DIAGNOSIS — R5383 Other fatigue: Secondary | ICD-10-CM | POA: Diagnosis not present

## 2023-02-18 DIAGNOSIS — F17210 Nicotine dependence, cigarettes, uncomplicated: Secondary | ICD-10-CM | POA: Diagnosis not present

## 2023-02-18 DIAGNOSIS — R030 Elevated blood-pressure reading, without diagnosis of hypertension: Secondary | ICD-10-CM | POA: Diagnosis not present

## 2023-02-18 DIAGNOSIS — Z6822 Body mass index (BMI) 22.0-22.9, adult: Secondary | ICD-10-CM | POA: Diagnosis not present

## 2023-03-06 ENCOUNTER — Other Ambulatory Visit: Payer: Self-pay | Admitting: Internal Medicine

## 2023-03-06 DIAGNOSIS — E1042 Type 1 diabetes mellitus with diabetic polyneuropathy: Secondary | ICD-10-CM

## 2023-03-24 ENCOUNTER — Other Ambulatory Visit: Payer: Self-pay | Admitting: Unknown Physician Specialty

## 2023-03-24 DIAGNOSIS — Z1231 Encounter for screening mammogram for malignant neoplasm of breast: Secondary | ICD-10-CM

## 2023-03-29 ENCOUNTER — Encounter: Payer: Self-pay | Admitting: Internal Medicine

## 2023-03-29 ENCOUNTER — Ambulatory Visit (INDEPENDENT_AMBULATORY_CARE_PROVIDER_SITE_OTHER): Payer: 59 | Admitting: Internal Medicine

## 2023-03-29 VITALS — BP 124/60 | HR 68 | Ht 67.0 in | Wt 148.4 lb

## 2023-03-29 DIAGNOSIS — E1065 Type 1 diabetes mellitus with hyperglycemia: Secondary | ICD-10-CM | POA: Diagnosis not present

## 2023-03-29 DIAGNOSIS — E782 Mixed hyperlipidemia: Secondary | ICD-10-CM

## 2023-03-29 DIAGNOSIS — E1042 Type 1 diabetes mellitus with diabetic polyneuropathy: Secondary | ICD-10-CM

## 2023-03-29 DIAGNOSIS — R7989 Other specified abnormal findings of blood chemistry: Secondary | ICD-10-CM

## 2023-03-29 DIAGNOSIS — Z794 Long term (current) use of insulin: Secondary | ICD-10-CM

## 2023-03-29 LAB — HEMOGLOBIN A1C: Hemoglobin A1C: 8.7

## 2023-03-29 MED ORDER — GLUCAGON 3 MG/DOSE NA POWD: 3.0000 mg | Freq: Once | NASAL | 11 refills | Status: DC | PRN

## 2023-03-29 MED ORDER — DEXCOM G6 SENSOR MISC: 1 refills | Status: DC

## 2023-03-29 NOTE — Patient Instructions (Addendum)
Please use the following pump settings: - basal rates: 12 am: 0.9 - ICR:              12 am: 1:11 >> 1:10 - target: 120-130  - ISF: 50 - Active insulin time: 4 hours  Please do the following approximately 15 minutes before every meal and snack: - Enter carbs (C) - Enter sugars (S) - Start insulin bolus (I)  Please return in 4 months.

## 2023-03-29 NOTE — Progress Notes (Addendum)
Patient ID: [name], female   DOB: 1971/03/05, 52 y.o.   MRN: [id number]  HPI: [name] is a 52 y.o.-year-old female, returning for f/u for DM1, dx in 64 (52 y/o), uncontrolled, with complications (cerebro-vascular ds - h/o TIA 0/2016, PN, DR). Last visit 4 months ago.  Insurance: [employer].  Interim history: No increased urination, blurry vision, chest pain.    She continues to have numbness and tingling in her feet. She lost almost 40 lbs in the year prior to our last visit.  She mentions that this was unintentional.  No nausea or diarrhea.  She stopped metformin before last visit  Insulin pump: -Previously OmniPod -Then Medtronic 670 G insulin pump but could not afford the Enlite CGM -then T: slim X2-started 10/2019 -now Omnipod5 - started 06/30/2021 - likes this better (changed 10/2022).  She is using NovoLog FlexPen the reservoir.  She prefers this.  CGM: -Dexcom G6 >> would like to switch to G7 sensor.  Insulin: -Sugars improved after her insurance switched coverage from Humalog to NovoLog  Supplies:  -[place]  Reviewed HbA1c levels: Lab Results  Component Value Date   HGBA1C 9.0 (A) 11/25/2022   HGBA1C 8.7 (A) 06/14/2022   HGBA1C 9.5 (A) 09/14/2021   Previously on: - Lantus 25 units in am and 10 units at night - Novolog ICR 1:17, target 150, ISF 1:25  On insulin pump: Pump settings:  - basal rates: 12 am: 0.6 >> 0.9 - ICR:              12 am: 1:11  -did not make this change - target: 120-130  - ISF: 50 - Active insulin time: 4 hours TDD from basal insulin: 17.1 units (75%) TDD from bolus insulin: 5.7 units (25%)  Total daily dose: 22-50 units daily  She was also on: - Metformin 500 mg 2x a day - started 09/2021 -stopped after she started to be more fatigued, but she realized that this was not really related to metformin. She stopped Metformin 10/2022 2/2 weight loss.  She checks her sugars more than 4 times a day with her CGM (could not  directly download the Dexcom report that she is not connected to clarity app):  Previously:  Prev.:   Lowest sugar was 27 in 07/2014...>> 30s (!) - sensor pb. >> 60s >> 50s >> 40s. No hypoglycemia awareness!  She has a glucagon pen at home. Highest sugar was 601 .. >> 1200 (!!!) ...>> 400 >> 300s >> 300s. No history of hypoglycemia admissions.   She  had hyperglycemia ED visits and an admission in 12/2017. She had a DKA admission in 09/2018 - with suspicion of stroke.  This was ruled out by MRI and she also had normal carotid Dopplers afterwards.   -No CKD, last BUN/creatinine:  Lab Results  Component Value Date   BUN 6 11/25/2022   CREATININE 0.92 11/25/2022  On Lisinopril 5 mg daily.  -+ HL; last set of lipids: Lab Results  Component Value Date   CHOL 155 11/25/2022   HDL 45.40 11/25/2022   LDLCALC 89 11/25/2022   TRIG 105.0 11/25/2022   CHOLHDL 3 11/25/2022  On pravastatin 20 >> 40, fish oil 1200 mg daily.  - last eye exam was in 2024: + DR.  -+ Numbness and tingling in feet and hands. She was previously on Neurontin but she stopped due to side effects. Currently off Lyrica.  Last foot exam 08/2021.  She has been following with Dr. Logan Bores, but  I do not see comprehensive foot exams per review of the chart.  Reviewed B12 levels: Lab Results  Component Value Date   VITAMINB12 1,291 (H) 11/25/2022   VITAMINB12 >1504 (H) 10/06/2021   VITAMINB12 650 10/17/2020   VITAMINB12 >1526 (H) 04/04/2020   VITAMINB12 >1500 (H) 12/26/2018   VITAMINB12 481 12/06/2014  She stopped B12 in 2020.  No history of hypothyroidism: Lab Results  Component Value Date   TSH 2.53 11/25/2022   On Diazepam. Stopped Wellbutrin due to constipation. She had tooth surgery in 01/2018, Dr. Teena Dunk.   She had bilateral big toe surgery in 09/2016 with Dr. Logan Bores.   ROS: + See HPI  I reviewed pt's medications, allergies, PMH, social hx, family hx, and changes were documented in the history of present  illness. Otherwise, unchanged from my initial visit note.  Past Medical History:  Diagnosis Date   Benign paroxysmal vertigo    Cervicalgia    Depression    Diabetes mellitus (HCC)    GERD (gastroesophageal reflux disease)    Hiatal hernia    History of cardiac catheterization    Normal coronary arteries 2016 and 2021   History of TIA (transient ischemic attack)    Hyperlipidemia    Hypertension    IBS (irritable bowel syndrome)    Migraine headache    Panic disorder with agoraphobia    Peripheral neuropathy    Subungual hematoma of digit of hand    Tendonitis    Vertigo    Past Surgical History:  Procedure Laterality Date   ABDOMINAL HYSTERECTOMY     APPENDECTOMY     BREAST BIOPSY Right 12/01/2022   MM RT BREAST BX W LOC DEV 1ST LESION IMAGE BX SPEC STEREO GUIDE 12/01/2022 [facility]   CARDIAC CATHETERIZATION   last in 2009   x 4, normal coronary arteries   CARDIAC CATHETERIZATION N/A 03/26/2015   Procedure: Left Heart Cath and Coronary Angiography;  Surgeon: Iran Ouch, MD;  Location: [facility];  Service: Cardiovascular;  Laterality: N/A;   CARPAL TUNNEL RELEASE Right 08/24/2018   Procedure: CARPAL TUNNEL RELEASE;  Surgeon: Vickki Hearing, MD;  Location: [facility];  Service: Orthopedics;  Laterality: Right;   CESAREAN SECTION     CHOLECYSTECTOMY     COLONOSCOPY WITH PROPOFOL N/A 01/09/2018   Procedure: COLONOSCOPY WITH PROPOFOL;  Surgeon: Malissa Hippo, MD;  Location: [facility];  Service: Endoscopy;  Laterality: N/A;   COLONOSCOPY WITH PROPOFOL N/A 09/21/2019   Procedure: COLONOSCOPY WITH PROPOFOL;  Surgeon: Malissa Hippo, MD;  Location: [facility];  Service: Endoscopy;  Laterality: N/A;  1055   CYST EXCISION     right breast   ESOPHAGOGASTRODUODENOSCOPY (EGD) WITH PROPOFOL N/A 01/09/2018   Procedure: ESOPHAGOGASTRODUODENOSCOPY (EGD) WITH PROPOFOL;  Surgeon: Malissa Hippo, MD;  Location: [facility];  Service: Endoscopy;   Laterality: N/A;   LEFT HEART CATH AND CORONARY ANGIOGRAPHY N/A 02/25/2020   Procedure: LEFT HEART CATH AND CORONARY ANGIOGRAPHY;  Surgeon: Lennette Bihari, MD;  Location: [facility];  Service: Cardiovascular;  Laterality: N/A;   POLYPECTOMY  09/21/2019   Procedure: POLYPECTOMY;  Surgeon: Malissa Hippo, MD;  Location: [facility];  Service: Endoscopy;;  ascending colon;   TOE SURGERY Right    3 rd and 4 th toes   TOOTH EXTRACTION Bilateral 02/16/2018   Procedure: 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 RIGHT MAXILLARY SINUS ANTROSTOMY;  Surgeon: Ocie Doyne, DDS;  Location:  [facility];  Service: Oral Surgery;  Laterality: Bilateral;   TUBAL LIGATION     History   Social History   Marital Status: Divorced    Spouse Name: N/A   Number of Children: 1   Occupational History   disabled   Social History Main Topics   Smoking status: Current Every Day Smoker -- 0.50 packs/day for 11 years   Smokeless tobacco: Never Used     Comment: patient is aware that she needs to quit smoking   Alcohol Use: No   Drug Use: No   Current Outpatient Medications on File Prior to Visit  Medication Sig Dispense Refill   citalopram (CELEXA) 20 MG tablet Take 20 mg by mouth daily.     Continuous Glucose Sensor (DEXCOM G6 SENSOR) MISC INJECT 1 DEVICE INTO THE SKIN CONTINOUS FOR 10 DAYS. 3 each 3   Continuous Glucose Transmitter (DEXCOM G6 TRANSMITTER) MISC use as directed change every 90 days. 1 each 3   diazepam (VALIUM) 5 MG tablet Take 5 mg by mouth 3 (three) times daily as needed for anxiety.      docusate sodium (COLACE) 100 MG capsule Take 100-200 mg by mouth See admin instructions. Take 100 mg by mouth in the morning and 200 mg in the evening     EPIPEN 2-PAK 0.3 MG/0.3ML SOAJ injection Inject 0.3 mg into the muscle as needed for anaphylaxis.      estradiol (ESTRACE) 1 MG tablet Take 1 mg by mouth daily.     Glucagon 3 MG/DOSE POWD Place 3 mg into the nose once as needed for up to  1 dose. 1 each 11   ibuprofen (ADVIL) 800 MG tablet TAKE 1 TABLET BY MOUTH 3 TIMES DAILY. (Patient taking differently: Take 800 mg by mouth as needed.) 90 tablet 0   Insulin Aspart FlexPen (NOVOLOG) 100 UNIT/ML INJECT UP TO 60 UNITS IN THE PUMP DAILY. (Patient not taking: Reported on 08/03/2022) 60 mL 3   Insulin Disposable Pump (OMNIPOD 5 G6 PODS, GEN 5,) MISC 1 applicator by Does not apply route every 3 (three) days. 30 each 3   lisinopril (PRINIVIL,ZESTRIL) 5 MG tablet Take 5 mg by mouth daily.  2   metFORMIN (GLUCOPHAGE) 500 MG tablet Take 1 tablet (500 mg total) by mouth 2 (two) times daily with a meal. (Patient not taking: Reported on 08/03/2022) 180 tablet 3   nitroGLYCERIN (NITROSTAT) 0.4 MG SL tablet Place 1 tablet (0.4 mg total) under the tongue every 5 (five) minutes x 3 doses as needed for chest pain (if no relief after 3rd dose, proceed to the ED for an evaluation or call 911). 25 tablet 3   oxyCODONE-acetaminophen (PERCOCET) 7.5-325 MG tablet Take 1 tablet by mouth every 4 (four) hours as needed for severe pain. (Patient not taking: Reported on 08/03/2022) 30 tablet 0   pravastatin (PRAVACHOL) 40 MG tablet Take 1 tablet (40 mg total) by mouth at bedtime. 90 tablet 3   promethazine (PHENERGAN) 25 MG tablet Take 1 tablet (25 mg total) by mouth every 6 (six) hours as needed for nausea or vomiting. 30 tablet 0   No current facility-administered medications on file prior to visit.   Allergies  Allergen Reactions   Bee Venom Anaphylaxis and Hives   Zocor [Simvastatin] Other (See Comments)    Muscle aches and pains   Clindamycin/Lincomycin Rash   Codeine Rash   Penicillins Rash    Did it involve swelling of the face/tongue/throat, SOB, or low BP? No Did  it involve sudden or severe rash/hives, skin peeling, or any reaction on the inside of your mouth or nose? Yes Did you need to seek medical attention at a hospital or doctor's office? Yes When did it last happen?      30 years If all above  answers are "NO", may proceed with cephalosporin use.    Sulfa Antibiotics Rash        Family History  Problem Relation Age of Onset   Stroke Mother 14   Heart attack Mother 56   Cancer Sister        colon   Heart failure Maternal Grandmother 37   Cancer Maternal Grandfather        prostate   Heart failure Paternal Grandmother 75   Heart failure Paternal Grandfather 76   PE: BP 124/60   Pulse 68   Ht 5\' 7"  (1.702 m)   Wt 148 lb 6.4 oz (67.3 kg)   SpO2 96%   BMI 23.24 kg/m   Wt Readings from Last 3 Encounters:  03/29/23 148 lb 6.4 oz (67.3 kg)  11/25/22 150 lb (68 kg)  11/02/22 154 lb 9.6 oz (70.1 kg)   Constitutional: overweight, in NAD, in wheelchair, right foot in boot Eyes:  EOMI, no exophthalmos ENT: no neck masses, no cervical lymphadenopathy Cardiovascular: RRR, No MRG Respiratory: CTA B Musculoskeletal: no deformities Skin:no rashes Neurological: no tremor with outstretched hands Diabetic Foot Exam - Simple   Simple Foot Form Diabetic Foot exam was performed with the following findings: Yes 03/29/2023  3:28 PM  Visual Inspection No deformities, no ulcerations, no other skin breakdown bilaterally: Yes Sensation Testing See comments: Yes Pulse Check Posterior Tibialis and Dorsalis pulse intact bilaterally: Yes Comments Almost absent sensation to monofilament in bilateral forefeet    ASSESSMENT: 1. DM1, uncontrolled, with complications - cerebro-vascular ds - h/o TIA 0/2016 - PN - DR Sees cardiology >> Dr. Dionne Milo - [facility]  2. HL  3.  High B12  PLAN:  1. Patient with longstanding, uncontrolled, type diabetes, on OmniPod 5 insulin pump, with still suboptimal control.  She was previously on the t:slim X2 insulin pump but she liked the OmniPod better.  At last visit, reviewing the CGM trends, sugars are fluctuating around the upper limit of the target range, with better sugars in the morning and higher blood sugars after meals, as the day  went by.  Highest blood sugars are after dinner.  She was still not introducing enough carbs into the pump and we again discussed about doing so.  However, I also felt that her basal rates and insulin to carb ratios were suboptimal for her so increase the basal rates and strengthened the ICR's. -She was previously on metformin but the last time she tried (10/2022), she lost too much weight and stopped. CGM interpretation: -At today's visit, we reviewed her CGM downloads: It appears that 62% of values are in target range (goal >70%), while 38% are higher than 180 (goal <25%), and 0% are lower than 70 (goal <4%).  The calculated average blood sugar is 173.  The projected HbA1c for the next 3 months (GMI) is approximately 7.6%. -Reviewing the CGM trends, sugars appear to be still fluctuating in the upper half of the target range but with significant hyperglycemic spikes especially in the evening, after approximately 8 PM.  Her sugars can also be high after breakfast and dinner.  Upon reviewing individual daily data, he has days in which she does not enter any carbs  into the pump, but she occasionally enters a low amount of carbs (25-35) before meals or when the sugars are already high after the meals.  We discussed that this is not conducive to good control.  She needs to enter all of the carbs for all of the meals into the pump approximately 15 minutes before each meal and then bolus for them.  Until she does list, we will not be able to control her diabetes.  I did advise her that she now has in her hand too powerful tools to control her blood sugars (the CGM and the pump).  I would definitely recommend that she uses them.  She needs to start bolusing before every meal, but for now I also advised her to strengthen her insulin to carb ratio slightly.  She did not do so after last visit, as advised. -She is due for a refill for her sensors -for now I refilled the G6.  I explained that the OmniPod that communicated  with the G7 are not in the pharmacies quite yet. - I suggested to:  Patient Instructions  Please use the following pump settings: - basal rates: 12 am: 0.9 - ICR:              12 am: 1:11 >> 1:10 - target: 120-130  - ISF: 50 - Active insulin time: 4 hours  Please do the following approximately 15 minutes before every meal and snack: - Enter carbs (C) - Enter sugars (S) - Start insulin bolus (I)  Please return in 4 months.   - we checked her HbA1c: 8.7% (lower) - advised to check sugars at different times of the day - 4x a day, rotating check times - advised for yearly eye exams >> she is UTD - return to clinic in 4 months  2. HL -Reviewed latest lipid panel from 11/2022: LDL above target of less than 55 due to her history of stroke, otherwise fractions at goal: Lab Results  Component Value Date   CHOL 155 11/25/2022   HDL 45.40 11/25/2022   LDLCALC 89 11/25/2022   TRIG 105.0 11/25/2022   CHOLHDL 3 11/25/2022  -At last visit I advised her to increase pravastatin from 20 to 40 mg daily.  She takes this without side effects now.  3.  High B12 -She had a high B12 level in the past on supplements -She came off supplements afterwards -At last visit, the B12 level was lower but still slightly high so I did not advise to restart the B12 supplement: Lab Results  Component Value Date   [id number] 1,291 (H) 11/25/2022   Carlus Pavlov, MD PhD [facility] Endocrinology

## 2023-03-30 ENCOUNTER — Encounter: Payer: Self-pay | Admitting: Internal Medicine

## 2023-04-11 DIAGNOSIS — M79606 Pain in leg, unspecified: Secondary | ICD-10-CM | POA: Diagnosis not present

## 2023-04-11 DIAGNOSIS — M25552 Pain in left hip: Secondary | ICD-10-CM | POA: Diagnosis not present

## 2023-04-11 DIAGNOSIS — R519 Headache, unspecified: Secondary | ICD-10-CM | POA: Diagnosis not present

## 2023-04-11 DIAGNOSIS — R030 Elevated blood-pressure reading, without diagnosis of hypertension: Secondary | ICD-10-CM | POA: Diagnosis not present

## 2023-04-11 DIAGNOSIS — Z6823 Body mass index (BMI) 23.0-23.9, adult: Secondary | ICD-10-CM | POA: Diagnosis not present

## 2023-04-11 DIAGNOSIS — M25551 Pain in right hip: Secondary | ICD-10-CM | POA: Diagnosis not present

## 2023-04-11 DIAGNOSIS — R5383 Other fatigue: Secondary | ICD-10-CM | POA: Diagnosis not present

## 2023-04-25 ENCOUNTER — Ambulatory Visit: Payer: 59

## 2023-04-26 DIAGNOSIS — R519 Headache, unspecified: Secondary | ICD-10-CM | POA: Diagnosis not present

## 2023-04-26 DIAGNOSIS — Z23 Encounter for immunization: Secondary | ICD-10-CM | POA: Diagnosis not present

## 2023-04-26 DIAGNOSIS — R5383 Other fatigue: Secondary | ICD-10-CM | POA: Diagnosis not present

## 2023-04-26 DIAGNOSIS — Z6822 Body mass index (BMI) 22.0-22.9, adult: Secondary | ICD-10-CM | POA: Diagnosis not present

## 2023-05-03 ENCOUNTER — Ambulatory Visit
Admission: RE | Admit: 2023-05-03 | Discharge: 2023-05-03 | Disposition: A | Discharge status: Home / Self Care | Payer: 59 | Source: Ambulatory Visit | Attending: Unknown Physician Specialty | Admitting: Unknown Physician Specialty

## 2023-05-03 DIAGNOSIS — Z1231 Encounter for screening mammogram for malignant neoplasm of breast: Secondary | ICD-10-CM | POA: Diagnosis not present

## 2023-07-04 ENCOUNTER — Ambulatory Visit: Payer: 59 | Admitting: Internal Medicine

## 2023-07-05 ENCOUNTER — Ambulatory Visit: Payer: 59 | Admitting: Internal Medicine

## 2023-07-07 ENCOUNTER — Other Ambulatory Visit: Payer: Self-pay | Admitting: Internal Medicine

## 2023-07-07 DIAGNOSIS — E1065 Type 1 diabetes mellitus with hyperglycemia: Secondary | ICD-10-CM

## 2023-07-14 DIAGNOSIS — Z720 Tobacco use: Secondary | ICD-10-CM | POA: Diagnosis not present

## 2023-07-14 DIAGNOSIS — E109 Type 1 diabetes mellitus without complications: Secondary | ICD-10-CM | POA: Diagnosis not present

## 2023-07-14 DIAGNOSIS — Z6822 Body mass index (BMI) 22.0-22.9, adult: Secondary | ICD-10-CM | POA: Diagnosis not present

## 2023-07-14 DIAGNOSIS — Z0000 Encounter for general adult medical examination without abnormal findings: Secondary | ICD-10-CM | POA: Diagnosis not present

## 2023-07-14 DIAGNOSIS — E7849 Other hyperlipidemia: Secondary | ICD-10-CM | POA: Diagnosis not present

## 2023-07-14 DIAGNOSIS — R030 Elevated blood-pressure reading, without diagnosis of hypertension: Secondary | ICD-10-CM | POA: Diagnosis not present

## 2023-07-21 DIAGNOSIS — R918 Other nonspecific abnormal finding of lung field: Secondary | ICD-10-CM | POA: Diagnosis not present

## 2023-07-21 DIAGNOSIS — Z122 Encounter for screening for malignant neoplasm of respiratory organs: Secondary | ICD-10-CM | POA: Diagnosis not present

## 2023-07-21 DIAGNOSIS — R93421 Abnormal radiologic findings on diagnostic imaging of right kidney: Secondary | ICD-10-CM | POA: Diagnosis not present

## 2023-07-21 DIAGNOSIS — F17210 Nicotine dependence, cigarettes, uncomplicated: Secondary | ICD-10-CM | POA: Diagnosis not present

## 2023-07-21 DIAGNOSIS — J439 Emphysema, unspecified: Secondary | ICD-10-CM | POA: Diagnosis not present

## 2023-08-08 DIAGNOSIS — E103293 Type 1 diabetes mellitus with mild nonproliferative diabetic retinopathy without macular edema, bilateral: Secondary | ICD-10-CM | POA: Diagnosis not present

## 2023-08-08 LAB — HM DIABETES EYE EXAM

## 2023-08-10 DIAGNOSIS — J069 Acute upper respiratory infection, unspecified: Secondary | ICD-10-CM | POA: Diagnosis not present

## 2023-08-10 DIAGNOSIS — Z6823 Body mass index (BMI) 23.0-23.9, adult: Secondary | ICD-10-CM | POA: Diagnosis not present

## 2023-08-10 DIAGNOSIS — J0101 Acute recurrent maxillary sinusitis: Secondary | ICD-10-CM | POA: Diagnosis not present

## 2023-08-10 DIAGNOSIS — R030 Elevated blood-pressure reading, without diagnosis of hypertension: Secondary | ICD-10-CM | POA: Diagnosis not present

## 2023-08-12 ENCOUNTER — Ambulatory Visit: Payer: 59 | Admitting: Internal Medicine

## 2023-08-23 ENCOUNTER — Encounter: Payer: Self-pay | Admitting: Internal Medicine

## 2023-08-23 ENCOUNTER — Ambulatory Visit (INDEPENDENT_AMBULATORY_CARE_PROVIDER_SITE_OTHER): Payer: 59 | Admitting: Internal Medicine

## 2023-08-23 VITALS — BP 120/70 | HR 87 | Ht 67.0 in | Wt 143.4 lb

## 2023-08-23 DIAGNOSIS — E1042 Type 1 diabetes mellitus with diabetic polyneuropathy: Secondary | ICD-10-CM | POA: Diagnosis not present

## 2023-08-23 DIAGNOSIS — R7989 Other specified abnormal findings of blood chemistry: Secondary | ICD-10-CM | POA: Diagnosis not present

## 2023-08-23 DIAGNOSIS — E1065 Type 1 diabetes mellitus with hyperglycemia: Secondary | ICD-10-CM

## 2023-08-23 DIAGNOSIS — E782 Mixed hyperlipidemia: Secondary | ICD-10-CM

## 2023-08-23 LAB — POCT GLYCOSYLATED HEMOGLOBIN (HGB A1C): Hemoglobin A1C: 9 % — AB (ref 4.0–5.6)

## 2023-08-23 NOTE — Addendum Note (Signed)
Addended by: Pollie Meyer on: 08/23/2023 03:42 PM   Modules accepted: Orders

## 2023-08-23 NOTE — Progress Notes (Signed)
Patient ID: [name], female   DOB: 08/24/1970, 53 y.o.   MRN: [id number]  HPI: [name] is a 53 y.o.-year-old female, returning for f/u for DM1, dx in 92 (53 y/o), uncontrolled, with complications (cerebro-vascular ds - h/o TIA 0/2016, PN, DR). Last visit 4 months ago.  Insurance: [employer].  Interim history: No increased urination, blurry vision, chest pain.    She continues to have numbness and tingling in her feet. She lost almost 40 lbs in the year prior to our last visit.  She mentions that this was unintentional.  No nausea or diarrhea.  She stopped metformin before last visit. Since last visit, she lost another 6 pounds.  Insulin pump: -Previously OmniPod -Then Medtronic 670 G insulin pump but could not afford the Enlite CGM -then T: slim X2-started 10/2019 -now Omnipod5 - started 06/30/2021 - likes this better (changed 10/2022).  She is using NovoLog FlexPen the reservoir.  She prefers this.  CGM: -Dexcom G6   Insulin: -Sugars improved after her insurance switched coverage from Humalog to NovoLog  Supplies:  -[place]  Reviewed HbA1c levels: Lab Results  Component Value Date   HGBA1C 8.7 03/29/2023   HGBA1C 9.0 (A) 11/25/2022   HGBA1C 8.7 (A) 06/14/2022   Previously on: - Lantus 25 units in am and 10 units at night - Novolog ICR 1:17, target 150, ISF 1:25  On insulin pump: Pump settings:  - basal rates: 12 am: 0.6 >> 0.9 - ICR:              12 am: 1:11 >> 1:10 - target: 120-130  - ISF: 50 - Active insulin time: 4 hours TDD from basal insulin: 17.1 units (75%) >> 77% TDD from bolus insulin: 5.7 units (25%) >> 23% Total daily dose: 25-50 units daily  She was previously also on: - Metformin 500 mg 2x a day - started 09/2021 -stopped after she started to be more fatigued, but she realized that this was not really related to metformin. She stopped Metformin 10/2022 2/2 weight loss.  She checks her sugars more than 4 times a day with her CGM  (could not directly download the Dexcom report that she is not connected to clarity app):  Previously:  Previously:  Prev.:   Lowest sugar was 27 in 07/2014...>> 30s (!) - sensor pb. >> 60s >> 50s >> 40s >. 40s. No hypoglycemia awareness!  She has a glucagon pen at home. Highest sugar was 601 .. >> 1200 (!!!) ...>> 400 >> 300s >> 300s  >. 400s. No history of hypoglycemia admissions.   She  had hyperglycemia ED visits and an admission in 12/2017. She had a DKA admission in 09/2018 - with suspicion of stroke.  This was ruled out by MRI and she also had normal carotid Dopplers afterwards.   -No CKD, last BUN/creatinine:  Lab Results  Component Value Date   BUN 6 11/25/2022   CREATININE 0.92 11/25/2022   Lab Results  Component Value Date   MICRALBCREAT 1.0 11/25/2022   MICRALBCREAT 1.0 10/06/2021   MICRALBCREAT 3.0 12/26/2018   MICRALBCREAT 3.6 04/20/2018   MICRALBCREAT 4.5 05/17/2016  On Lisinopril 5 mg daily.  -+ HL; last set of lipids: Lab Results  Component Value Date   CHOL 155 11/25/2022   HDL 45.40 11/25/2022   LDLCALC 89 11/25/2022   TRIG 105.0 11/25/2022   CHOLHDL 3 11/25/2022  On pravastatin 20 >> 40, fish oil 1200 mg daily.  - last eye exam was in 2024: +  DR.  -+ Numbness and tingling in feet and hands. She was previously on Neurontin but she stopped due to side effects. Currently off Lyrica.  Last foot exam 03/2023.  She has been following with Dr. Logan Bores, but I do not see comprehensive foot exams per review of the chart.  Reviewed B12 levels: Lab Results  Component Value Date   VITAMINB12 1,291 (H) 11/25/2022   VITAMINB12 >1504 (H) 10/06/2021   VITAMINB12 650 10/17/2020   VITAMINB12 >1526 (H) 04/04/2020   VITAMINB12 >1500 (H) 12/26/2018   VITAMINB12 481 12/06/2014  She stopped B12 in 2020.  No history of hypothyroidism: Lab Results  Component Value Date   TSH 2.53 11/25/2022   On Diazepam. Stopped Wellbutrin due to constipation. She had tooth  surgery in 01/2018, Dr. Teena Dunk.   She had bilateral big toe surgery in 09/2016 with Dr. Logan Bores.   ROS: + See HPI  I reviewed pt's medications, allergies, PMH, social hx, family hx, and changes were documented in the history of present illness. Otherwise, unchanged from my initial visit note.  Past Medical History:  Diagnosis Date   Benign paroxysmal vertigo    Cervicalgia    Depression    Diabetes mellitus (HCC)    GERD (gastroesophageal reflux disease)    Hiatal hernia    History of cardiac catheterization    Normal coronary arteries 2016 and 2021   History of TIA (transient ischemic attack)    Hyperlipidemia    Hypertension    IBS (irritable bowel syndrome)    Migraine headache    Panic disorder with agoraphobia    Peripheral neuropathy    Subungual hematoma of digit of hand    Tendonitis    Vertigo    Past Surgical History:  Procedure Laterality Date   ABDOMINAL HYSTERECTOMY     APPENDECTOMY     BREAST BIOPSY Right 12/01/2022   MM RT BREAST BX W LOC DEV 1ST LESION IMAGE BX SPEC STEREO GUIDE 12/01/2022 [facility]   CARDIAC CATHETERIZATION   last in 2009   x 4, normal coronary arteries   CARDIAC CATHETERIZATION N/A 03/26/2015   Procedure: Left Heart Cath and Coronary Angiography;  Surgeon: Iran Ouch, MD;  [facility];  Service: Cardiovascular;  Laterality: N/A;   CARPAL TUNNEL RELEASE Right 08/24/2018   Procedure: CARPAL TUNNEL RELEASE;  Surgeon: Vickki Hearing, MD;  Location: [facility];  Service: Orthopedics;  Laterality: Right;   CESAREAN SECTION     CHOLECYSTECTOMY     COLONOSCOPY WITH PROPOFOL N/A 01/09/2018   Procedure: COLONOSCOPY WITH PROPOFOL;  Surgeon: Malissa Hippo, MD;  [facility];  Service: Endoscopy;  Laterality: N/A;   COLONOSCOPY WITH PROPOFOL N/A 09/21/2019   Procedure: COLONOSCOPY WITH PROPOFOL;  Surgeon: Malissa Hippo, MD;  [facility];  Service: Endoscopy;  Laterality: N/A;  1055   CYST  EXCISION     right breast   ESOPHAGOGASTRODUODENOSCOPY (EGD) WITH PROPOFOL N/A 01/09/2018   Procedure: ESOPHAGOGASTRODUODENOSCOPY (EGD) WITH PROPOFOL;  Surgeon: Malissa Hippo, MD;  [facility];  Service: Endoscopy;  Laterality: N/A;   LEFT HEART CATH AND CORONARY ANGIOGRAPHY N/A 02/25/2020   Procedure: LEFT HEART CATH AND CORONARY ANGIOGRAPHY;  Surgeon: Lennette Bihari, MD;  [facility];  Service: Cardiovascular;  Laterality: N/A;   POLYPECTOMY  09/21/2019   Procedure: POLYPECTOMY;  Surgeon: Malissa Hippo, MD;  [facility];  Service: Endoscopy;;  ascending colon;   TOE SURGERY  Right    3 rd and 4 th toes   TOOTH EXTRACTION Bilateral 02/16/2018   Procedure: 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 RIGHT MAXILLARY SINUS ANTROSTOMY;  Surgeon: Ocie Doyne, DDS;  Location: [facility];  Service: Oral Surgery;  Laterality: Bilateral;   TUBAL LIGATION     History   Social History   Marital Status: Divorced    Spouse Name: N/A   Number of Children: 1   Occupational History   disabled   Social History Main Topics   Smoking status: Current Every Day Smoker -- 0.50 packs/day for 11 years   Smokeless tobacco: Never Used     Comment: patient is aware that she needs to quit smoking   Alcohol Use: No   Drug Use: No   Current Outpatient Medications on File Prior to Visit  Medication Sig Dispense Refill   citalopram (CELEXA) 20 MG tablet Take 20 mg by mouth daily.     Continuous Glucose Sensor (DEXCOM G6 SENSOR) MISC Use as advised 9 each 1   Continuous Glucose Transmitter (DEXCOM G6 TRANSMITTER) MISC use as directed change every 90 days. 1 each 3   diazepam (VALIUM) 5 MG tablet Take 5 mg by mouth 3 (three) times daily as needed for anxiety.      docusate sodium (COLACE) 100 MG capsule Take 100-200 mg by mouth See admin instructions. Take 100 mg by mouth in the morning and 200 mg in the evening     EPIPEN 2-PAK 0.3 MG/0.3ML SOAJ injection  Inject 0.3 mg into the muscle as needed for anaphylaxis.      estradiol (ESTRACE) 1 MG tablet Take 1 mg by mouth daily.     Glucagon 3 MG/DOSE POWD Place 3 mg into the nose once as needed for up to 1 dose. 1 each 11   ibuprofen (ADVIL) 800 MG tablet TAKE 1 TABLET BY MOUTH 3 TIMES DAILY. (Patient taking differently: Take 800 mg by mouth as needed.) 90 tablet 0   Insulin Aspart FlexPen (NOVOLOG) 100 UNIT/ML imject up to 60 units in the pump daily. 60 mL 1   Insulin Disposable Pump (OMNIPOD 5 G6 PODS, GEN 5,) MISC 1 applicator by Does not apply route every 3 (three) days. 30 each 3   lisinopril (PRINIVIL,ZESTRIL) 5 MG tablet Take 5 mg by mouth daily.  2   nitroGLYCERIN (NITROSTAT) 0.4 MG SL tablet Place 1 tablet (0.4 mg total) under the tongue every 5 (five) minutes x 3 doses as needed for chest pain (if no relief after 3rd dose, proceed to the ED for an evaluation or call 911). 25 tablet 3   oxyCODONE-acetaminophen (PERCOCET) 7.5-325 MG tablet Take 1 tablet by mouth every 4 (four) hours as needed for severe pain. 30 tablet 0   pravastatin (PRAVACHOL) 40 MG tablet Take 1 tablet (40 mg total) by mouth at bedtime. 90 tablet 3   promethazine (PHENERGAN) 25 MG tablet Take 1 tablet (25 mg total) by mouth every 6 (six) hours as needed for nausea or vomiting. 30 tablet 0   No current facility-administered medications on file prior to visit.   Allergies  Allergen Reactions   Bee Venom Anaphylaxis and Hives   Zocor [Simvastatin] Other (See Comments)    Muscle aches and pains   Clindamycin/Lincomycin Rash   Codeine Rash   Penicillins Rash    Did it involve swelling of the face/tongue/throat, SOB, or low BP? No Did it involve sudden or severe rash/hives, skin peeling, or any reaction  on the inside of your mouth or nose? Yes Did you need to seek medical attention at a hospital or doctor's office? Yes When did it last happen?      30 years If all above answers are "NO", may proceed with cephalosporin  use.    Sulfa Antibiotics Rash        Family History  Problem Relation Age of Onset   Stroke Mother 66   Heart attack Mother 61   Cancer Sister        colon   Heart failure Maternal Grandmother 76   Cancer Maternal Grandfather        prostate   Heart failure Paternal Grandmother 6   Heart failure Paternal Grandfather 26   PE: BP 120/70   Pulse 87   Ht 5\' 7"  (1.702 m)   Wt 143 lb 6.4 oz (65 kg)   SpO2 98%   BMI 22.46 kg/m   Wt Readings from Last 3 Encounters:  08/23/23 143 lb 6.4 oz (65 kg)  03/29/23 148 lb 6.4 oz (67.3 kg)  11/25/22 150 lb (68 kg)   Constitutional: overweight, in NAD Eyes:  EOMI, no exophthalmos ENT: no neck masses, no cervical lymphadenopathy Cardiovascular: RRR, No MRG Respiratory: CTA B Musculoskeletal: no deformities Skin:no rashes Neurological: no tremor with outstretched hands  ASSESSMENT: 1. DM1, uncontrolled, with complications - cerebro-vascular ds - h/o TIA 0/2016 - PN - DR Sees cardiology >> Dr. Dionne Milo - [facility]  2. HL  3.  High B12  PLAN:  1. Patient with longstanding, uncontrolled, type 1 diabetes, on the OmniPod insulin pump integrated with the Dexcom G6 CGM, but still poor control.  She was previously on the t:slim X2 insulin pump but she prefers the OmniPod.  She was previously also on metformin but stopped in 10/2022 as she felt she lost too much weight on it. -At last visit sugars are fluctuating in the upper half of the target range with significant hyperglycemic spikes especially in the evening, after approximately 8 PM.  Sugars were also occasionally higher after breakfast and dinner.  She was not entering carbs into the pump, only occasionally entering a small amount (25-35) when the sugars already high after the meal.  We discussed about the importance of bolusing 15 minutes before meals and I also recommended to strengthen her insulin to carb ratio slightly. CGM interpretation: -At today's visit, we reviewed  her CGM downloads: It appears that 49% of values are in target range (goal >70%), while 49% are higher than 180 (goal <25%), and 2% are lower than 70 (goal <4%).  The calculated average blood sugar is 182.  The projected HbA1c for the next 3 months (GMI) is approximately 8% -Reviewing the CGM trends, sugars are decreasing overnight slowly, with a nadir around 10 AM but significant increases in blood sugars after both meals of the day. -Upon review of her pump tracings, she is still not entering carbs into the pump but does some manual boluses when sugars are running high after meals.  I again advised her that this is not conducive to good control and will not be able to improve her diabetes control until she enters carbs into the pump and does appropriate boluses 15 minutes before meals.  She is frequently kicked out of the auto mode due to maximal delivery rate and switches to manual mode.  In the manual mode she is dropping her blood sugars between 3 AM and 11 AM fairly consistently.  I advised her  to reduce the basal rate between these times.  Otherwise, I cannot change her regimen until she starts bolusing correctly. - I suggested to:  Patient Instructions  Please use the following pump settings: - basal rates: 12 am: 0.9 3 am: 0.9 >> 0.75 11 am: 0.9 - ICR:              12 am: 1:10 - target: 120-130  - ISF: 50 - Active insulin time: 4 hours  Please do the following approximately 15 minutes before every meal and snack: - Enter carbs (C) - Enter sugars (S) - Start insulin bolus (I)  Please return in 4 months.   - we checked her HbA1c: 9% (higher) - advised to check sugars at different times of the day - 4x a day, rotating check times - advised for yearly eye exams >> she is UTD - return to clinic in 4 months  2. HL -Latest lipid panel showed an LDL above goal of less than 55: Lab Results  Component Value Date   CHOL 155 11/25/2022   HDL 45.40 11/25/2022   LDLCALC 89 11/25/2022    TRIG 105.0 11/25/2022   CHOLHDL 3 11/25/2022  -She is on pravastatin 40 mg daily without side effects  3.  High B12 -Previously on B12 supplements but she came off afterwards -Latest B12 level was still slightly high Lab Results  Component Value Date   VITAMINB12 1,291 (H) 11/25/2022  -Will check this at next visit  Carlus Pavlov, MD PhD [facility] Endocrinology

## 2023-08-23 NOTE — Patient Instructions (Addendum)
Please use the following pump settings: - basal rates: 12 am: 0.9 3 am: 0.9 >> 0.75 11 am: 0.9 - ICR:              12 am: 1:10 - target: 120-130  - ISF: 50 - Active insulin time: 4 hours  Please do the following approximately 15 minutes before every meal and snack: - Enter carbs (C) - Enter sugars (S) - Start insulin bolus (I)  Please return in 4 months.

## 2023-08-24 ENCOUNTER — Other Ambulatory Visit: Payer: Self-pay | Admitting: Internal Medicine

## 2023-08-24 ENCOUNTER — Other Ambulatory Visit: Payer: Self-pay

## 2023-08-24 DIAGNOSIS — E1065 Type 1 diabetes mellitus with hyperglycemia: Secondary | ICD-10-CM

## 2023-08-24 MED ORDER — INSULIN PEN NEEDLE 32G X 4 MM MISC: 3 refills | Status: AC

## 2023-08-24 MED ORDER — DEXCOM G6 SENSOR MISC
1 refills | Status: DC
Start: 2023-08-24 — End: 2023-09-19

## 2023-08-24 MED ORDER — DEXCOM G6 TRANSMITTER MISC
3 refills | Status: DC
Start: 2023-08-24 — End: 2023-09-20

## 2023-08-24 MED ORDER — LANTUS SOLOSTAR 100 UNIT/ML ~~LOC~~ SOPN: 22.0000 [IU] | PEN_INJECTOR | Freq: Every day | SUBCUTANEOUS | 99 refills | Status: DC

## 2023-08-26 DIAGNOSIS — R109 Unspecified abdominal pain: Secondary | ICD-10-CM | POA: Diagnosis not present

## 2023-08-31 DIAGNOSIS — Z6822 Body mass index (BMI) 22.0-22.9, adult: Secondary | ICD-10-CM | POA: Diagnosis not present

## 2023-08-31 DIAGNOSIS — R030 Elevated blood-pressure reading, without diagnosis of hypertension: Secondary | ICD-10-CM | POA: Diagnosis not present

## 2023-08-31 DIAGNOSIS — M79604 Pain in right leg: Secondary | ICD-10-CM | POA: Diagnosis not present

## 2023-08-31 DIAGNOSIS — M461 Sacroiliitis, not elsewhere classified: Secondary | ICD-10-CM | POA: Diagnosis not present

## 2023-08-31 NOTE — Progress Notes (Signed)
  Intake history:  BP 99/67   Pulse 90   Ht 5\' 5"  (1.651 m)   Wt 144 lb (65.3 kg)   BMI 23.96 kg/m  Body mass index is 23.96 kg/m.    WHAT ARE WE SEEING YOU FOR TODAY?   back - right lower back  Radiation?: yes.   Loss of bowel/urine control?  - no  How long has this bothered you? (DOI?DOS?WS?)  on 07/19/2024  Anticoag.  No  Diabetes Yes  Heart disease no  Hypertension no  SMOKING HX Yes  Kidney disease No  Any ALLERGIES ______________________________________________   Treatment:  Have you taken:  Tylenol Yes  Advil No  Had PT No  Had injection No  Other  _________________________

## 2023-09-02 ENCOUNTER — Other Ambulatory Visit (INDEPENDENT_AMBULATORY_CARE_PROVIDER_SITE_OTHER): Payer: 59

## 2023-09-02 ENCOUNTER — Ambulatory Visit (INDEPENDENT_AMBULATORY_CARE_PROVIDER_SITE_OTHER): Payer: 59 | Admitting: Orthopedic Surgery

## 2023-09-02 ENCOUNTER — Encounter: Payer: Self-pay | Admitting: Orthopedic Surgery

## 2023-09-02 VITALS — BP 99/67 | HR 90 | Ht 65.0 in | Wt 144.0 lb

## 2023-09-02 DIAGNOSIS — M5126 Other intervertebral disc displacement, lumbar region: Secondary | ICD-10-CM

## 2023-09-02 DIAGNOSIS — M545 Low back pain, unspecified: Secondary | ICD-10-CM | POA: Diagnosis not present

## 2023-09-02 DIAGNOSIS — G8929 Other chronic pain: Secondary | ICD-10-CM | POA: Diagnosis not present

## 2023-09-02 DIAGNOSIS — M5441 Lumbago with sciatica, right side: Secondary | ICD-10-CM

## 2023-09-02 MED ORDER — METHOCARBAMOL 500 MG PO TABS
500.0000 mg | ORAL_TABLET | Freq: Three times a day (TID) | ORAL | 1 refills | Status: DC
Start: 2023-09-02 — End: 2024-01-09

## 2023-09-02 MED ORDER — GABAPENTIN 100 MG PO CAPS
100.0000 mg | ORAL_CAPSULE | Freq: Three times a day (TID) | ORAL | 2 refills | Status: DC
Start: 2023-09-02 — End: 2024-01-09

## 2023-09-02 MED ORDER — PREDNISONE 10 MG (48) PO TBPK
ORAL_TABLET | Freq: Every day | ORAL | 0 refills | Status: DC
Start: 2023-09-02 — End: 2023-09-21

## 2023-09-02 NOTE — Patient Instructions (Signed)
Your insurance does not require approval for MRI please go ahead and call to schedule your appointment with [place] within at least one (1) week.   Central Scheduling [phone]

## 2023-09-02 NOTE — Progress Notes (Signed)
  Intake history:  BP 99/67   Pulse 90   Ht 5\' 5"  (1.651 m)   Wt 144 lb (65.3 kg)   BMI 23.96 kg/m  Body mass index is 23.96 kg/m.    WHAT ARE WE SEEING YOU FOR TODAY?   back - right lower back  Radiation?: yes.   Loss of bowel/urine control?  - no  How long has this bothered you? (DOI?DOS?WS?)  on 07/19/2024  Anticoag.  No  Diabetes Yes  Heart disease no  Hypertension no  SMOKING HX Yes  Kidney disease No  Any ALLERGIES ______________________________________________   Treatment:  Have you taken:  Tylenol Yes  Advil No  Had PT No  Had injection No  Other  _________________________

## 2023-09-02 NOTE — Progress Notes (Signed)
Office Visit Note   Intake history:  BP 99/67   Pulse 90   Ht 5\' 5"  (1.651 m)   Wt 144 lb (65.3 kg)   BMI 23.96 kg/m  Body mass index is 23.96 kg/m.    WHAT ARE WE SEEING YOU FOR TODAY?   back - right lower back  Radiation?: yes.   Loss of bowel/urine control?  - no  How long has this bothered you? (DOI?DOS?WS?)  on 07/19/2024  Anticoag.  No  Diabetes Yes  Heart disease no  Hypertension no  SMOKING HX Yes  Kidney disease No  Any ALLERGIES ______________________________________________   Treatment:  Have you taken:  Tylenol Yes  Advil No  Had PT No  Had injection No  Other  _________________________       Patient: [name]           Date of Birth: Jan 13, 1971           MRN: [id number] Visit Date: 09/02/2023 Requested by: Lawerance Sabal, [place] [address] [place],  [place] [address] PCP: Lawerance Sabal, [place]   Assessment & Plan:   Encounter Diagnoses  Name Primary?   Acute right-sided low back pain with right-sided sciatica Yes   Herniated lumbar intervertebral disc     Meds ordered this encounter  Medications   gabapentin (NEURONTIN) 100 MG capsule    Sig: Take 1 capsule (100 mg total) by mouth 3 (three) times daily.    Dispense:  90 capsule    Refill:  2   predniSONE (STERAPRED UNI-PAK 48 TAB) 10 MG (48) TBPK tablet    Sig: Take by mouth daily.    Dispense:  48 tablet    Refill:  0   methocarbamol (ROBAXIN) 500 MG tablet    Sig: Take 1 tablet (500 mg total) by mouth 3 (three) times daily.    Dispense:  60 tablet    Refill:  44    53 year old female appears to have acute herniated disc with radiculopathy  Ice, heat, topical menthol type creams and rubs did not improve her symptoms  Comes in with right sided lower back pain radiation into the right leg  MRI to rule out disc herniation  Take medications as noted above  Use heat to help control symptoms  Activity modification       Subjective: Chief Complaint  Patient  presents with   Back Pain    Pain starts in the lower right back and radiates down the leg and the knee  used biofreeze ice heat and nothing working     HPI: 89 female no history of back pain presents with acute onset of lower back pain over 6 weeks ago radiating to the right leg.  Patient complains of severe lower back pain on the right radiates into the right hip knee and into the lower leg.  She tried ice heat Tylenol ibuprofen topical medication no improvement              ROS: Review of Systems  Constitutional:  Negative for chills, fever, malaise/fatigue and weight loss.  Respiratory:  Negative for shortness of breath.   Cardiovascular:  Negative for chest pain.  Gastrointestinal:  Negative for constipation.       Denies loss bowel control   Genitourinary:        Denies urinary retention or los of bladder control   Neurological:  Negative for tingling.      Images personally read and my interpretation : DG  Lumbar Spine 2-3 Views Result Date: 09/02/2023 Images of the lumbar spine Acute back pain with right leg radiculopathy Normal spinal alignment and disc spaces no evidence of spondylolysis or listhesis Normal lumbar spine     Visit Diagnoses:  1. Acute right-sided low back pain with right-sided sciatica   2. Herniated lumbar intervertebral disc      Follow-Up Instructions: Return for MRI RESULTS, DR WIL CALL RESUTS TO YOU.    Objective: Vital Signs: BP 99/67   Pulse 90   Ht 5\' 5"  (1.651 m)   Wt 144 lb (65.3 kg)   BMI 23.96 kg/m   Physical Exam  The patient is awake alert and oriented x 3 mood and affect are normal  Body habitus normal BMI 23   Ortho Exam  Spine exam shows patient is leaning to the right she has tenderness on the right side of her back with intense muscle spasm also on the right side.  Pain and tenderness were going to the buttock.  Straight leg raise is positive on the right negative on the left hip range of motion normal  bilaterally  Slight weakness in the dorsiflexion of the right foot toe extension normal bilaterally.  No weakness in the thighs.  Reflexes are 1-2+ and equal at the knee and ankles.  Toes are downgoing. Specialty Comments:  No specialty comments available.  Imaging: DG Lumbar Spine 2-3 Views Result Date: 09/02/2023 Images of the lumbar spine Acute back pain with right leg radiculopathy Normal spinal alignment and disc spaces no evidence of spondylolysis or listhesis Normal lumbar spine     PMFS History: Patient Active Problem List   Diagnosis Date Noted   IBS (irritable bowel syndrome) 10/08/2021   DKA (diabetic ketoacidosis) (HCC) 09/29/2018   Dehydration 09/29/2018   AKI (acute kidney injury) (HCC) 09/29/2018   Hyperkalemia 09/29/2018   Hyponatremia 09/29/2018   S/P carpal tunnel release right 08/24/18 09/14/2018   Carpal tunnel syndrome of right wrist    Depression 01/06/2018   Other constipation 01/05/2018   Family hx of colon cancer 11/10/2017   Chest pain 03/25/2015   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   Migraine 03/25/2015   Hypertension    Hyperlipidemia    Anxiety    Pain in the chest    Essential hypertension    Poorly controlled type 1 diabetes mellitus with peripheral neuropathy (HCC) 10/28/2014   TIA (transient ischemic attack) 08/17/2014   Tobacco abuse    Gastro-esophageal reflux disease without esophagitis 01/28/2014   Peripheral neuropathy 11/05/2013   Past Medical History:  Diagnosis Date   Benign paroxysmal vertigo    Cervicalgia    Depression    Diabetes mellitus (HCC)    GERD (gastroesophageal reflux disease)    Hiatal hernia    History of cardiac catheterization    Normal coronary arteries 2016 and 2021   History of TIA (transient ischemic attack)    Hyperlipidemia    Hypertension    IBS (irritable bowel syndrome)    Migraine headache    Panic disorder with agoraphobia    Peripheral neuropathy    Subungual hematoma  of digit of hand    Tendonitis    Vertigo     Family History  Problem Relation Age of Onset   Stroke Mother 68   Heart attack Mother 47   Cancer Sister        colon   Heart failure Maternal Grandmother 34   Cancer Maternal Grandfather  prostate   Heart failure Paternal Grandmother 39   Heart failure Paternal Grandfather 36    Past Surgical History:  Procedure Laterality Date   ABDOMINAL HYSTERECTOMY     APPENDECTOMY     BREAST BIOPSY Right 12/01/2022   MM RT BREAST BX W LOC DEV 1ST LESION IMAGE BX SPEC STEREO GUIDE 12/01/2022 [facility]   CARDIAC CATHETERIZATION   last in 2009   x 4, normal coronary arteries   CARDIAC CATHETERIZATION N/A 03/26/2015   Procedure: Left Heart Cath and Coronary Angiography;  Surgeon: Iran Ouch, MD;  [facility];  Service: Cardiovascular;  Laterality: N/A;   CARPAL TUNNEL RELEASE Right 08/24/2018   Procedure: CARPAL TUNNEL RELEASE;  Surgeon: Vickki Hearing, MD;  Location: [facility];  Service: Orthopedics;  Laterality: Right;   CESAREAN SECTION     CHOLECYSTECTOMY     COLONOSCOPY WITH PROPOFOL N/A 01/09/2018   Procedure: COLONOSCOPY WITH PROPOFOL;  Surgeon: Malissa Hippo, MD;  [facility];  Service: Endoscopy;  Laterality: N/A;   COLONOSCOPY WITH PROPOFOL N/A 09/21/2019   Procedure: COLONOSCOPY WITH PROPOFOL;  Surgeon: Malissa Hippo, MD;  [facility];  Service: Endoscopy;  Laterality: N/A;  1055   CYST EXCISION     right breast   ESOPHAGOGASTRODUODENOSCOPY (EGD) WITH PROPOFOL N/A 01/09/2018   Procedure: ESOPHAGOGASTRODUODENOSCOPY (EGD) WITH PROPOFOL;  Surgeon: Malissa Hippo, MD;  [facility];  Service: Endoscopy;  Laterality: N/A;   LEFT HEART CATH AND CORONARY ANGIOGRAPHY N/A 02/25/2020   Procedure: LEFT HEART CATH AND CORONARY ANGIOGRAPHY;  Surgeon: Lennette Bihari, MD;  [facility];  Service: Cardiovascular;  Laterality: N/A;   POLYPECTOMY  09/21/2019    Procedure: POLYPECTOMY;  Surgeon: Malissa Hippo, MD;  [facility];  Service: Endoscopy;;  ascending colon;   TOE SURGERY Right    3 rd and 4 th toes   TOOTH EXTRACTION Bilateral 02/16/2018   Procedure: 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 RIGHT MAXILLARY SINUS ANTROSTOMY;  Surgeon: Ocie Doyne, DDS;  Location: [facility];  Service: Oral Surgery;  Laterality: Bilateral;   TUBAL LIGATION     Social History   Occupational History   Not on file  Tobacco Use   Smoking status: Every Day    Current packs/day: 0.75    Average packs/day: 0.8 packs/day for 11.0 years (8.3 ttl pk-yrs)    Types: Cigarettes    Passive exposure: Never   Smokeless tobacco: Never   Tobacco comments:    10 ciggs per day   Vaping Use   Vaping status: Never Used  Substance and Sexual Activity   Alcohol use: Not Currently    Comment: occasionally   Drug use: No   Sexual activity: Not on file

## 2023-09-08 ENCOUNTER — Ambulatory Visit (HOSPITAL_COMMUNITY)
Admission: RE | Admit: 2023-09-08 | Discharge: 2023-09-08 | Disposition: A | Discharge status: Home / Self Care | Payer: 59 | Source: Ambulatory Visit | Attending: Orthopedic Surgery | Admitting: Orthopedic Surgery

## 2023-09-08 DIAGNOSIS — M5441 Lumbago with sciatica, right side: Secondary | ICD-10-CM | POA: Insufficient documentation

## 2023-09-08 DIAGNOSIS — M5126 Other intervertebral disc displacement, lumbar region: Secondary | ICD-10-CM | POA: Insufficient documentation

## 2023-09-08 DIAGNOSIS — M5136 Other intervertebral disc degeneration, lumbar region with discogenic back pain only: Secondary | ICD-10-CM | POA: Diagnosis not present

## 2023-09-14 ENCOUNTER — Other Ambulatory Visit (HOSPITAL_COMMUNITY): Payer: 59

## 2023-09-19 ENCOUNTER — Telehealth: Payer: Self-pay

## 2023-09-19 ENCOUNTER — Other Ambulatory Visit: Payer: Self-pay | Admitting: Internal Medicine

## 2023-09-19 DIAGNOSIS — M47816 Spondylosis without myelopathy or radiculopathy, lumbar region: Secondary | ICD-10-CM

## 2023-09-19 DIAGNOSIS — M5126 Other intervertebral disc displacement, lumbar region: Secondary | ICD-10-CM

## 2023-09-19 DIAGNOSIS — M5441 Lumbago with sciatica, right side: Secondary | ICD-10-CM

## 2023-09-19 DIAGNOSIS — E1065 Type 1 diabetes mellitus with hyperglycemia: Secondary | ICD-10-CM

## 2023-09-19 NOTE — Telephone Encounter (Signed)
Patient lvm asking for a return call if Dr. Romeo Apple has the results to her MRI.  [phone]

## 2023-09-20 ENCOUNTER — Telehealth: Payer: Self-pay

## 2023-09-20 DIAGNOSIS — E1042 Type 1 diabetes mellitus with diabetic polyneuropathy: Secondary | ICD-10-CM

## 2023-09-20 MED ORDER — DEXCOM G6 TRANSMITTER MISC
3 refills | Status: AC
Start: 2023-09-20 — End: ?

## 2023-09-20 MED ORDER — DEXCOM G6 SENSOR MISC
3 refills | Status: DC
Start: 2023-09-20 — End: 2023-09-27

## 2023-09-20 NOTE — Telephone Encounter (Signed)
Requested Prescriptions   Signed Prescriptions Disp Refills   Continuous Glucose Sensor (DEXCOM G6 SENSOR) MISC 9 each 3    Sig: Use as advised    Authorizing Provider: Carlus Pavlov    Ordering User: Edythe Riches S   Continuous Glucose Transmitter (DEXCOM G6 TRANSMITTER) MISC 1 each 3    Sig: Use as directed change every 90 days.    Authorizing Provider: Carlus Pavlov    Ordering User: Pollie Meyer

## 2023-09-21 ENCOUNTER — Telehealth: Payer: Self-pay | Admitting: Radiology

## 2023-09-21 DIAGNOSIS — M5126 Other intervertebral disc displacement, lumbar region: Secondary | ICD-10-CM

## 2023-09-21 DIAGNOSIS — M5441 Lumbago with sciatica, right side: Secondary | ICD-10-CM

## 2023-09-21 DIAGNOSIS — M47816 Spondylosis without myelopathy or radiculopathy, lumbar region: Secondary | ICD-10-CM

## 2023-09-21 MED ORDER — PREDNISONE 10 MG (48) PO TBPK: ORAL_TABLET | Freq: Every day | ORAL | 0 refills | Status: DC

## 2023-09-21 NOTE — Telephone Encounter (Signed)
-----   Message from Fuller Canada sent at 09/21/2023 11:29 AM EST ----- Order PT for back pain   [place]

## 2023-09-21 NOTE — Telephone Encounter (Signed)
Results given  Rec PT in [place] and prednisone   She will ck sugar as she is diabetic

## 2023-09-27 ENCOUNTER — Telehealth: Payer: Self-pay

## 2023-09-27 DIAGNOSIS — E1065 Type 1 diabetes mellitus with hyperglycemia: Secondary | ICD-10-CM

## 2023-09-27 MED ORDER — DEXCOM G7 SENSOR MISC
3 refills | Status: DC
Start: 2023-09-27 — End: 2024-01-02

## 2023-09-27 MED ORDER — OMNIPOD 5 G7 PODS (GEN 5) MISC
1.0000 | 3 refills | Status: DC
Start: 2023-09-27 — End: 2024-01-02

## 2023-09-27 NOTE — Telephone Encounter (Signed)
 Requested Prescriptions   Signed Prescriptions Disp Refills   Insulin Disposable Pump (OMNIPOD 5 G7 PODS, GEN 5,) MISC 30 each 3    Sig: 1 each by Does not apply route every 3 (three) days.    Authorizing Provider: Carlus Pavlov    Ordering User: Winnell Bento S   Continuous Glucose Sensor (DEXCOM G7 SENSOR) MISC 9 each 3    Sig: Use to check glucose continuously, change sensor every 10 days    Authorizing Provider: Carlus Pavlov    Ordering User: Pollie Meyer

## 2023-10-05 MED ORDER — DEXCOM G7 RECEIVER DEVI: 0 refills | Status: AC

## 2023-10-05 NOTE — Addendum Note (Signed)
 Addended by: Pollie Meyer on: 10/05/2023 05:01 PM   Modules accepted: Orders

## 2023-10-08 DIAGNOSIS — S300XXA Contusion of lower back and pelvis, initial encounter: Secondary | ICD-10-CM | POA: Diagnosis not present

## 2023-10-08 DIAGNOSIS — F17210 Nicotine dependence, cigarettes, uncomplicated: Secondary | ICD-10-CM | POA: Diagnosis not present

## 2023-10-08 DIAGNOSIS — Z79899 Other long term (current) drug therapy: Secondary | ICD-10-CM | POA: Diagnosis not present

## 2023-10-08 DIAGNOSIS — S79911A Unspecified injury of right hip, initial encounter: Secondary | ICD-10-CM | POA: Diagnosis not present

## 2023-10-08 DIAGNOSIS — M25551 Pain in right hip: Secondary | ICD-10-CM | POA: Diagnosis not present

## 2023-10-08 DIAGNOSIS — R109 Unspecified abdominal pain: Secondary | ICD-10-CM | POA: Diagnosis not present

## 2023-10-08 DIAGNOSIS — M1611 Unilateral primary osteoarthritis, right hip: Secondary | ICD-10-CM | POA: Diagnosis not present

## 2023-10-08 DIAGNOSIS — Z882 Allergy status to sulfonamides status: Secondary | ICD-10-CM | POA: Diagnosis not present

## 2023-10-08 DIAGNOSIS — S3983XA Other specified injuries of pelvis, initial encounter: Secondary | ICD-10-CM | POA: Diagnosis not present

## 2023-10-08 DIAGNOSIS — E785 Hyperlipidemia, unspecified: Secondary | ICD-10-CM | POA: Diagnosis not present

## 2023-10-08 DIAGNOSIS — G930 Cerebral cysts: Secondary | ICD-10-CM | POA: Diagnosis not present

## 2023-10-08 DIAGNOSIS — S0990XA Unspecified injury of head, initial encounter: Secondary | ICD-10-CM | POA: Diagnosis not present

## 2023-10-08 DIAGNOSIS — E119 Type 2 diabetes mellitus without complications: Secondary | ICD-10-CM | POA: Diagnosis not present

## 2023-10-08 DIAGNOSIS — W1830XA Fall on same level, unspecified, initial encounter: Secondary | ICD-10-CM | POA: Diagnosis not present

## 2023-10-08 DIAGNOSIS — S098XXA Other specified injuries of head, initial encounter: Secondary | ICD-10-CM | POA: Diagnosis not present

## 2023-10-08 DIAGNOSIS — Z880 Allergy status to penicillin: Secondary | ICD-10-CM | POA: Diagnosis not present

## 2023-10-09 DIAGNOSIS — S098XXA Other specified injuries of head, initial encounter: Secondary | ICD-10-CM | POA: Diagnosis not present

## 2023-10-09 DIAGNOSIS — S3983XA Other specified injuries of pelvis, initial encounter: Secondary | ICD-10-CM | POA: Diagnosis not present

## 2023-10-09 DIAGNOSIS — G930 Cerebral cysts: Secondary | ICD-10-CM | POA: Diagnosis not present

## 2023-10-09 DIAGNOSIS — M25551 Pain in right hip: Secondary | ICD-10-CM | POA: Diagnosis not present

## 2023-11-02 ENCOUNTER — Ambulatory Visit: Admitting: Nutrition

## 2023-11-10 DIAGNOSIS — Z720 Tobacco use: Secondary | ICD-10-CM | POA: Diagnosis not present

## 2023-11-10 DIAGNOSIS — J209 Acute bronchitis, unspecified: Secondary | ICD-10-CM | POA: Diagnosis not present

## 2023-11-10 DIAGNOSIS — Z6822 Body mass index (BMI) 22.0-22.9, adult: Secondary | ICD-10-CM | POA: Diagnosis not present

## 2023-11-10 DIAGNOSIS — J029 Acute pharyngitis, unspecified: Secondary | ICD-10-CM | POA: Diagnosis not present

## 2023-11-10 DIAGNOSIS — Z112 Encounter for screening for other bacterial diseases: Secondary | ICD-10-CM | POA: Diagnosis not present

## 2023-11-10 DIAGNOSIS — R509 Fever, unspecified: Secondary | ICD-10-CM | POA: Diagnosis not present

## 2023-12-07 ENCOUNTER — Encounter: Attending: Internal Medicine | Admitting: Nutrition

## 2023-12-07 DIAGNOSIS — E1065 Type 1 diabetes mellitus with hyperglycemia: Secondary | ICD-10-CM | POA: Diagnosis not present

## 2023-12-07 DIAGNOSIS — E1042 Type 1 diabetes mellitus with diabetic polyneuropathy: Secondary | ICD-10-CM | POA: Diagnosis not present

## 2023-12-08 DIAGNOSIS — K5900 Constipation, unspecified: Secondary | ICD-10-CM | POA: Diagnosis not present

## 2023-12-08 DIAGNOSIS — Z6822 Body mass index (BMI) 22.0-22.9, adult: Secondary | ICD-10-CM | POA: Diagnosis not present

## 2023-12-12 NOTE — Progress Notes (Signed)
 Patient is here today because her sensor readings are not going to the Pod.  When she started her sensor on her phone, she did not do this same thing on her PDM.  She was shown again how to do this and she reported good understanding of this.  The warm-up period was active, and she was told that if they do no appear on the PDM in 25 minutes, to call the Dexcom help line.  Telephone number given.  She had no final questions.

## 2023-12-12 NOTE — Patient Instructions (Signed)
 When changing out your sensor.  First change this on your phone app, then on the PDM- menue-sensor- start new-scan sensor.

## 2023-12-28 DIAGNOSIS — K5909 Other constipation: Secondary | ICD-10-CM | POA: Diagnosis not present

## 2023-12-28 DIAGNOSIS — R42 Dizziness and giddiness: Secondary | ICD-10-CM | POA: Diagnosis not present

## 2023-12-28 DIAGNOSIS — Z6822 Body mass index (BMI) 22.0-22.9, adult: Secondary | ICD-10-CM | POA: Diagnosis not present

## 2024-01-02 ENCOUNTER — Encounter (INDEPENDENT_AMBULATORY_CARE_PROVIDER_SITE_OTHER): Payer: Self-pay | Admitting: *Deleted

## 2024-01-02 ENCOUNTER — Other Ambulatory Visit: Payer: Self-pay

## 2024-01-02 DIAGNOSIS — E1065 Type 1 diabetes mellitus with hyperglycemia: Secondary | ICD-10-CM

## 2024-01-02 MED ORDER — DEXCOM G7 SENSOR MISC
3 refills | Status: DC
Start: 2024-01-02 — End: 2024-01-10

## 2024-01-02 MED ORDER — OMNIPOD 5 G7 PODS (GEN 5) MISC
1.0000 | 3 refills | Status: DC
Start: 2024-01-02 — End: 2024-01-10

## 2024-01-03 ENCOUNTER — Other Ambulatory Visit: Payer: Self-pay

## 2024-01-09 ENCOUNTER — Encounter: Payer: Self-pay | Admitting: *Deleted

## 2024-01-09 ENCOUNTER — Ambulatory Visit (INDEPENDENT_AMBULATORY_CARE_PROVIDER_SITE_OTHER): Admitting: Gastroenterology

## 2024-01-09 ENCOUNTER — Encounter (INDEPENDENT_AMBULATORY_CARE_PROVIDER_SITE_OTHER): Payer: Self-pay | Admitting: Gastroenterology

## 2024-01-09 VITALS — BP 92/59 | HR 87 | Temp 98.2°F | Ht 67.0 in | Wt 140.3 lb

## 2024-01-09 DIAGNOSIS — K921 Melena: Secondary | ICD-10-CM | POA: Diagnosis not present

## 2024-01-09 DIAGNOSIS — R634 Abnormal weight loss: Secondary | ICD-10-CM | POA: Insufficient documentation

## 2024-01-09 DIAGNOSIS — Z800 Family history of malignant neoplasm of digestive organs: Secondary | ICD-10-CM

## 2024-01-09 DIAGNOSIS — K5900 Constipation, unspecified: Secondary | ICD-10-CM | POA: Diagnosis not present

## 2024-01-09 DIAGNOSIS — R110 Nausea: Secondary | ICD-10-CM | POA: Diagnosis not present

## 2024-01-09 MED ORDER — PEG 3350-KCL-NA BICARB-NACL 420 G PO SOLR: 4000.0000 mL | Freq: Once | ORAL | 0 refills | Status: AC

## 2024-01-09 MED ORDER — PRUCALOPRIDE SUCCINATE 2 MG PO TABS: 2.0000 mg | ORAL_TABLET | Freq: Every day | ORAL | 1 refills | Status: DC

## 2024-01-09 NOTE — Patient Instructions (Signed)
 We will schedule upper endoscopy and colonoscopy given your black stools, nausea, weight loss, worsening constipation I am sending a bowel prep to get you cleaned out We will start prucalopride 2mg  daily and miralax  1 Capful twice daily as well after you complete bowel prep Please let me know if this is not working I will also check a few labs to look at blood counts, electrolyte and thyroid  function Increase water  intake, aim for atleast 64 oz per day Increase fruits, veggies and whole grains, kiwi and prunes are especially good for constipation  Follow up 2 months  It was a pleasure to see you today. I want to create trusting relationships with patients and provide genuine, compassionate, and quality care. I truly value your feedback! please be on the lookout for a survey regarding your visit with me today. I appreciate your input about our visit and your time in completing this!    Tayanna Talford L. Georgetta Crafton, MSN, APRN, AGNP-C Adult-Gerontology Nurse Practitioner [facility] at [facility]

## 2024-01-09 NOTE — H&P (View-Only) (Signed)
 Referring Provider: Levada Raymond, PA Primary Care Physician:  Levada Raymond, [place] Primary GI Physician: Dr. Sammi Crick   Chief Complaint  Patient presents with   Constipation    Pt arrives due to constipation. Pt  has not had bowel movement in 4 weeks. Pt has tried all OTC medications with no relief.  Pt had to dig out bowel movement and then take oral laxative to get the rest out about 5 weeks ago. Abdominal pain that radiates to back. Weight loss, bloating at times, nausea. Pt dad has Crohns, sister has colon cancer and grandmother had pancreatic cancer.    HPI:   [name] is a 53 y.o. female with past medical history of type 1 DM, depression, GERD, hyperlipidemia, IBS-C, panic disorder, peripheral neuropathy,, benign paroxysmal vertigo   Patient presenting today for:  Constipation Weight loss Melena Nausea   Last seen march 2023, at that time tried prune juice, metamucil, miralax , mag citrate and otc laxatives without relief of constipation. Took 2 linzess  145mcg pills daily x1 year and miralax  without improvement. Having bloating, discomfort. Having a BM every 2 weeks. Normal TSh of 2.82 just prior to visit   Recommended bowel prep, trulance 3mg  daily, add miralax  if still having constipation on trulance, referral for anorectal manometry, celiac serologies.   Anorectal manometry 02/01/2022 which showed presence of normal resting pressure in the internal sphincter, the external anal sphincter was able to generate but unable to maintain squeeze duration.  The patient was unable to expel the 50 cc balloon during the expulsion testing.   she has not presented any improvement with the use of Trulance and MiraLAX , referred for MR defecography at [facility] which was not completed  Celiac panel in march 2023 was negative.   Present:  Unsure if she ever went to [facility] for MR defecography. She has tried enemas, laxatives, more water . She has a lot of pain in her  abdomen and lower back. She has had to manually help herself have BM at times. She reports last BM was about 4 weeks ago. She is taking miralax  1 capful miralax  per day. Taking a fiber pill twice daily, she is taking stool softener daily.  She has had weight loss of about 14 pounds over the past year. Feels constipation has worsened recently. At baseline taking a laxative once weekly and having a BM 1-2 times per week. Denies any new medications. No recent lab work. She notes a black stool the last time she had a BM but no other episodes.  Denies any frequent NSAIDs or pain medication. She has lost weight, approx 14 pounds in the past year without trying. Denies any changes in her DM therapy. Feels appetite is decent. She has some nausea but no vomiting.  She has been on linzess , amitiza , trulance in the past without improvement.   Last Colonoscopy: 09/21/2019, 6 mm polyp in the ascending colon removed with a cold cold snare, external hemorrhoids. Pathology showed sessile serrated polyp   Family history colon cancer - sister 54.  Recommend to repeat colonoscopy in 5 years. Filed Weights   01/09/24 1513  Weight: 140 lb 4.8 oz (63.6 kg)     Past Medical History:  Diagnosis Date   Benign paroxysmal vertigo    Cervicalgia    Depression    Diabetes mellitus (HCC)    GERD (gastroesophageal reflux disease)    Hiatal hernia    History of cardiac catheterization    Normal coronary arteries 2016 and 2021  History of TIA (transient ischemic attack)    Hyperlipidemia    Hypertension    IBS (irritable bowel syndrome)    Migraine headache    Panic disorder with agoraphobia    Peripheral neuropathy    Subungual hematoma of digit of hand    Tendonitis    Vertigo     Past Surgical History:  Procedure Laterality Date   ABDOMINAL HYSTERECTOMY     APPENDECTOMY     BREAST BIOPSY Right 12/01/2022   MM RT BREAST BX W LOC DEV 1ST LESION IMAGE BX SPEC STEREO GUIDE 12/01/2022 [facility]GRAPHY    CARDIAC CATHETERIZATION   last in 2009   x 4, normal coronary arteries   CARDIAC CATHETERIZATION N/A 03/26/2015   Procedure: Left Heart Cath and Coronary Angiography;  Surgeon: Wenona Hamilton, MD;  Location: [facility];  Service: Cardiovascular;  Laterality: N/A;   CARPAL TUNNEL RELEASE Right 08/24/2018   Procedure: CARPAL TUNNEL RELEASE;  Surgeon: Darrin Emerald, MD;  Location: [facility] OR[facility];  Service: Orthopedics;  Laterality: Right;   CESAREAN SECTION     CHOLECYSTECTOMY     COLONOSCOPY WITH PROPOFOL  N/A 01/09/2018   Procedure: COLONOSCOPY WITH PROPOFOL ;  Surgeon: Ruby Corporal, MD;  Location: [facility];  Service: Endoscopy;  Laterality: N/A;   COLONOSCOPY WITH PROPOFOL  N/A 09/21/2019   Procedure: COLONOSCOPY WITH PROPOFOL ;  Surgeon: Ruby Corporal, MD;  Location: [facility];  Service: Endoscopy;  Laterality: N/A;  1055   CYST EXCISION     right breast   ESOPHAGOGASTRODUODENOSCOPY (EGD) WITH PROPOFOL  N/A 01/09/2018   Procedure: ESOPHAGOGASTRODUODENOSCOPY (EGD) WITH PROPOFOL ;  Surgeon: Ruby Corporal, MD;  Location: [facility];  Service: Endoscopy;  Laterality: N/A;   LEFT HEART CATH AND CORONARY ANGIOGRAPHY N/A 02/25/2020   Procedure: LEFT HEART CATH AND CORONARY ANGIOGRAPHY;  Surgeon: Millicent Ally, MD;  Location: [facility];  Service: Cardiovascular;  Laterality: N/A;   POLYPECTOMY  09/21/2019   Procedure: POLYPECTOMY;  Surgeon: Ruby Corporal, MD;  Location: [facility];  Service: Endoscopy;;  ascending colon;   TOE SURGERY Right    3 rd and 4 th toes   TOOTH EXTRACTION Bilateral 02/16/2018   Procedure: 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      Continuous Glucose Receiver (DEXCOM G7 RECEIVER) DEVI Use to monitor glucose continuously 1 each 0   Continuous Glucose Sensor (DEXCOM G7 SENSOR) MISC Use to check glucose continuously, change sensor every 10 days 9 each 3   Continuous Glucose Transmitter (DEXCOM G6 TRANSMITTER) MISC Use as directed change every 90 days. 1 each 3   diazepam  (VALIUM ) 5 MG tablet Take 5 mg by mouth 3 (three) times daily as needed for anxiety.      docusate sodium  (COLACE) 100 MG capsule Take 100-200 mg by mouth See admin instructions. Take 100 mg by mouth in the morning and 200 mg in the evening     EPIPEN  2-PAK 0.3 MG/0.3ML SOAJ injection Inject 0.3 mg into the muscle as needed for anaphylaxis.      Glucagon  3 MG/DOSE POWD Place 3 mg into the nose once as needed for up to 1 dose. 1 each  11   ibuprofen  (ADVIL ) 800 MG tablet TAKE 1 TABLET BY MOUTH 3 TIMES DAILY. (Patient taking differently: Take 800 mg by mouth as needed.) 90 tablet 0   Insulin  Aspart FlexPen (NOVOLOG ) 100 UNIT/ML imject up to 60 units in the pump daily. 60 mL 1   Insulin  Disposable Pump (OMNIPOD 5 G7 PODS, GEN 5,) MISC 1 each by Does not apply route every 3 (three) days. 30 each 3   Insulin  Pen Needle 32G X 4 MM MISC Use 1x a day 50 each 3   lisinopril  (PRINIVIL ,ZESTRIL ) 5 MG tablet Take 5 mg by mouth daily.  2   promethazine  (PHENERGAN ) 25 MG tablet Take 1 tablet (25 mg total) by mouth every 6 (six) hours as needed for nausea or vomiting. 30 tablet 0   rosuvastatin (CRESTOR) 40 MG tablet Take 40 mg by mouth daily.     venlafaxine XR (EFFEXOR-XR) 150 MG 24 hr capsule Take 150 mg by mouth daily.     insulin  glargine (LANTUS  SOLOSTAR) 100 UNIT/ML Solostar Pen Inject 22-25 Units into the skin at bedtime. (Patient not taking: Reported on 01/09/2024) 15 mL PRN   nitroGLYCERIN  (NITROSTAT ) 0.4 MG SL tablet Place 1 tablet (0.4 mg total) under the tongue every 5 (five) minutes x 3 doses as needed for chest pain (if no relief after 3rd dose, proceed to the ED for  an evaluation or call 911). 25 tablet 3   No current facility-administered medications for this visit.    Allergies as of 01/09/2024 - Review Complete 01/09/2024  Allergen Reaction Noted   Bee venom Anaphylaxis and Hives 09/13/2019   Zocor  [simvastatin ] Other (See Comments) 03/25/2015   Clindamycin /lincomycin Rash 10/07/2012   Codeine Rash 10/07/2012   Penicillins Rash 10/07/2012   Sulfa antibiotics Rash 10/07/2012    Social History   Socioeconomic History   Marital status: Legally Separated    Spouse name: Not on file   Number of children: Not on file   Years of education: Not on file   Highest education level: Not on file  Occupational History   Not on file  Tobacco Use   Smoking status: Every Day    Current packs/day: 0.75    Average packs/day: 0.8 packs/day for 11.0 years (8.3 ttl pk-yrs)    Types: Cigarettes    Passive exposure: Never   Smokeless tobacco: Never   Tobacco comments:    10 ciggs per day   Vaping Use   Vaping status: Never Used  Substance and Sexual Activity   Alcohol use: Not Currently    Comment: occasionally   Drug use: No   Sexual activity: Not on file  Other Topics Concern   Not on file  Social History Narrative   Right handed   Caffeine~4-5 cups per day (diet sun drop)   Lives at home alone    Social Drivers of Health   Financial Resource Strain: Low Risk  (08/06/2022)   Overall Financial Resource Strain (CARDIA)    Difficulty of Paying Living Expenses: Not very hard  Food Insecurity: No Food Insecurity (08/06/2022)   Hunger Vital Sign    Worried About Running Out of Food in the Last Year: Never true    Ran Out of Food in the Last Year: Never true  Transportation Needs: No Transportation Needs (08/06/2022)   PRAPARE - Administrator, Civil Service (Medical): No    Lack of Transportation (Non-Medical): No  Physical Activity: Insufficiently Active (04/13/2018)   [facility],  [facility]   Exercise Vital  Sign    Days of Exercise per Week: 3 days    Minutes of Exercise per Session: 30 min  Stress: Stress Concern Present (08/06/2022)   [employer] of Occupational Health - Occupational Stress Questionnaire    Feeling of Stress : To some extent  Social Connections: Not on file    Review of systems General: negative for malaise, night sweats, fever, chills, +weight loss  Neck: Negative for lumps, goiter, pain and significant neck swelling Resp: Negative for cough, wheezing, dyspnea at rest CV: Negative for chest pain, leg swelling, palpitations, orthopnea GI: denies hematochezia, vomiting, diarrhea, dysphagia, odyonophagia, early satiety +constipation +nausea +melena +weight loss MSK: Negative for joint pain or swelling, back pain, and muscle pain. Derm: Negative for itching or rash Psych: Denies depression, anxiety, memory loss, confusion. No homicidal or suicidal ideation.  Heme: Negative for prolonged bleeding, bruising easily, and swollen nodes. Endocrine: Negative for cold or heat intolerance, polyuria, polydipsia and goiter. Neuro: negative for tremor, gait imbalance, syncope and seizures. The remainder of the review of systems is noncontributory.  Physical Exam: BP (!) 92/59   Pulse 87   Temp 98.2 F (36.8 C)   Ht 5' 7 (1.702 m)   Wt 140 lb 4.8 oz (63.6 kg)   BMI 21.97 kg/m  General:   Alert and oriented. No distress noted. Pleasant and cooperative.  Head:  Normocephalic and atraumatic. Eyes:  Conjuctiva clear without scleral icterus. Mouth:  Oral mucosa pink and moist. Good dentition. No lesions. Heart: Normal rate and rhythm, s1 and s2 heart sounds present.  Lungs: Clear lung sounds in all lobes. Respirations equal and unlabored. Abdomen:  +BS, soft, and non-distended. Generalized TTP. No rebound or guarding. No HSM or masses noted. Derm: No palmar erythema or jaundice Msk:  Symmetrical without gross deformities. Normal posture. Extremities:  Without  edema. Neurologic:  Alert and  oriented x4 Psych:  Alert and cooperative. Normal mood and affect.  Invalid input(s): 6 MONTHS   ASSESSMENT: [name] is a 53 y.o. female presenting today for constipation, nausea, weight loss, melena  Patient with long history of constipation. Family history significant for Colon cancer in her sister at age 95. Her last TCS was 4 years ago. Notes worsening of constipation with last BM about 4 weeks ago. Has tried and failed many laxatives. she reports worsening of constipation recently without any precipitating factors, a 14 pound weight loss unintentionally and a melanotic stool. Constipation may likely be contributing to some of her symptomatology, though considering worsening constipation and significant weight loss, especially given her family history, I would recommend proceeding with colonoscopy and EGD as well due to weight loss, melena and nausea. For now will send bowel prep and advised her to start prucalopride 2mg  daily as well as miralax  BID thereafter. Indications, risks and benefits of procedure discussed in detail with patient. Patient verbalized understanding and is in agreement to proceed with  EGD/Colonoscopy   PLAN:  -bowel prep  -CBC, CMP, TSH -prucalopride 2mg  daily (start after bowel prep)  -miralax  1 capful BID  (start after bowel prep) -Increase water  intake, aim for atleast 64 oz per day -Increase fruits, veggies and whole grains, kiwi and prunes are especially good for constipation -consider MR defecography after colonoscopy if no acute findings  All questions were answered, patient verbalized understanding and is in agreement with plan as outlined above.    Follow Up: 2 months   Damione Robideau L. Anureet Bruington, MSN,  APRN, AGNP-C Adult-Gerontology Nurse Practitioner [facility]  I have reviewed the note and agree with the APP's assessment as described in this progress note  Samantha Cress,  MD Gastroenterology and Hepatology [facility]

## 2024-01-09 NOTE — Progress Notes (Signed)
 Referring Provider: Levada Raymond, PA Primary Care Physician:  Levada Raymond, [place] Primary GI Physician: Dr. Sammi Crick   Chief Complaint  Patient presents with   Constipation    Pt arrives due to constipation. Pt  has not had bowel movement in 4 weeks. Pt has tried all OTC medications with no relief.  Pt had to dig out bowel movement and then take oral laxative to get the rest out about 5 weeks ago. Abdominal pain that radiates to back. Weight loss, bloating at times, nausea. Pt dad has Crohns, sister has colon cancer and grandmother had pancreatic cancer.    HPI:   [name] is a 53 y.o. female with past medical history of type 1 DM, depression, GERD, hyperlipidemia, IBS-C, panic disorder, peripheral neuropathy,, benign paroxysmal vertigo   Patient presenting today for:  Constipation Weight loss Melena Nausea   Last seen march 2023, at that time tried prune juice, metamucil, miralax , mag citrate and otc laxatives without relief of constipation. Took 2 linzess  145mcg pills daily x1 year and miralax  without improvement. Having bloating, discomfort. Having a BM every 2 weeks. Normal TSh of 2.82 just prior to visit   Recommended bowel prep, trulance 3mg  daily, add miralax  if still having constipation on trulance, referral for anorectal manometry, celiac serologies.   Anorectal manometry 02/01/2022 which showed presence of normal resting pressure in the internal sphincter, the external anal sphincter was able to generate but unable to maintain squeeze duration.  The patient was unable to expel the 50 cc balloon during the expulsion testing.   she has not presented any improvement with the use of Trulance and MiraLAX , referred for MR defecography at [facility] which was not completed  Celiac panel in march 2023 was negative.   Present:  Unsure if she ever went to [facility] for MR defecography. She has tried enemas, laxatives, more water . She has a lot of pain in her  abdomen and lower back. She has had to manually help herself have BM at times. She reports last BM was about 4 weeks ago. She is taking miralax  1 capful miralax  per day. Taking a fiber pill twice daily, she is taking stool softener daily.  She has had weight loss of about 14 pounds over the past year. Feels constipation has worsened recently. At baseline taking a laxative once weekly and having a BM 1-2 times per week. Denies any new medications. No recent lab work. She notes a black stool the last time she had a BM but no other episodes.  Denies any frequent NSAIDs or pain medication. She has lost weight, approx 14 pounds in the past year without trying. Denies any changes in her DM therapy. Feels appetite is decent. She has some nausea but no vomiting.  She has been on linzess , amitiza , trulance in the past without improvement.   Last Colonoscopy: 09/21/2019, 6 mm polyp in the ascending colon removed with a cold cold snare, external hemorrhoids. Pathology showed sessile serrated polyp   Family history colon cancer - sister 51.  Recommend to repeat colonoscopy in 5 years. Filed Weights   01/09/24 1513  Weight: 140 lb 4.8 oz (63.6 kg)     Past Medical History:  Diagnosis Date   Benign paroxysmal vertigo    Cervicalgia    Depression    Diabetes mellitus (HCC)    GERD (gastroesophageal reflux disease)    Hiatal hernia    History of cardiac catheterization    Normal coronary arteries 2016 and 2021  History of TIA (transient ischemic attack)    Hyperlipidemia    Hypertension    IBS (irritable bowel syndrome)    Migraine headache    Panic disorder with agoraphobia    Peripheral neuropathy    Subungual hematoma of digit of hand    Tendonitis    Vertigo     Past Surgical History:  Procedure Laterality Date   ABDOMINAL HYSTERECTOMY     APPENDECTOMY     BREAST BIOPSY Right 12/01/2022   MM RT BREAST BX W LOC DEV 1ST LESION IMAGE BX SPEC STEREO GUIDE 12/01/2022 [facility]    CARDIAC CATHETERIZATION   last in 2009   x 4, normal coronary arteries   CARDIAC CATHETERIZATION N/A 03/26/2015   Procedure: Left Heart Cath and Coronary Angiography;  Surgeon: Wenona Hamilton, MD;  Location: [facility];  Service: Cardiovascular;  Laterality: N/A;   CARPAL TUNNEL RELEASE Right 08/24/2018   Procedure: CARPAL TUNNEL RELEASE;  Surgeon: Darrin Emerald, MD;  Location: [facility];  Service: Orthopedics;  Laterality: Right;   CESAREAN SECTION     CHOLECYSTECTOMY     COLONOSCOPY WITH PROPOFOL  N/A 01/09/2018   Procedure: COLONOSCOPY WITH PROPOFOL ;  Surgeon: Ruby Corporal, MD;  Location: [facility];  Service: Endoscopy;  Laterality: N/A;   COLONOSCOPY WITH PROPOFOL  N/A 09/21/2019   Procedure: COLONOSCOPY WITH PROPOFOL ;  Surgeon: Ruby Corporal, MD;  Location: [facility];  Service: Endoscopy;  Laterality: N/A;  1055   CYST EXCISION     right breast   ESOPHAGOGASTRODUODENOSCOPY (EGD) WITH PROPOFOL  N/A 01/09/2018   Procedure: ESOPHAGOGASTRODUODENOSCOPY (EGD) WITH PROPOFOL ;  Surgeon: Ruby Corporal, MD;  Location: [facility];  Service: Endoscopy;  Laterality: N/A;   LEFT HEART CATH AND CORONARY ANGIOGRAPHY N/A 02/25/2020   Procedure: LEFT HEART CATH AND CORONARY ANGIOGRAPHY;  Surgeon: Millicent Ally, MD;  Location: [facility];  Service: Cardiovascular;  Laterality: N/A;   POLYPECTOMY  09/21/2019   Procedure: POLYPECTOMY;  Surgeon: Ruby Corporal, MD;  Location: [facility];  Service: Endoscopy;;  ascending colon;   TOE SURGERY Right    3 rd and 4 th toes   TOOTH EXTRACTION Bilateral 02/16/2018   Procedure: 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      Continuous Glucose Receiver (DEXCOM G7 RECEIVER) DEVI Use to monitor glucose continuously 1 each 0   Continuous Glucose Sensor (DEXCOM G7 SENSOR) MISC Use to check glucose continuously, change sensor every 10 days 9 each 3   Continuous Glucose Transmitter (DEXCOM G6 TRANSMITTER) MISC Use as directed change every 90 days. 1 each 3   diazepam  (VALIUM ) 5 MG tablet Take 5 mg by mouth 3 (three) times daily as needed for anxiety.      docusate sodium  (COLACE) 100 MG capsule Take 100-200 mg by mouth See admin instructions. Take 100 mg by mouth in the morning and 200 mg in the evening     EPIPEN  2-PAK 0.3 MG/0.3ML SOAJ injection Inject 0.3 mg into the muscle as needed for anaphylaxis.      Glucagon  3 MG/DOSE POWD Place 3 mg into the nose once as needed for up to 1 dose. 1 each  11   ibuprofen  (ADVIL ) 800 MG tablet TAKE 1 TABLET BY MOUTH 3 TIMES DAILY. (Patient taking differently: Take 800 mg by mouth as needed.) 90 tablet 0   Insulin  Aspart FlexPen (NOVOLOG ) 100 UNIT/ML imject up to 60 units in the pump daily. 60 mL 1   Insulin  Disposable Pump (OMNIPOD 5 G7 PODS, GEN 5,) MISC 1 each by Does not apply route every 3 (three) days. 30 each 3   Insulin  Pen Needle 32G X 4 MM MISC Use 1x a day 50 each 3   lisinopril  (PRINIVIL ,ZESTRIL ) 5 MG tablet Take 5 mg by mouth daily.  2   promethazine  (PHENERGAN ) 25 MG tablet Take 1 tablet (25 mg total) by mouth every 6 (six) hours as needed for nausea or vomiting. 30 tablet 0   rosuvastatin (CRESTOR) 40 MG tablet Take 40 mg by mouth daily.     venlafaxine XR (EFFEXOR-XR) 150 MG 24 hr capsule Take 150 mg by mouth daily.     insulin  glargine (LANTUS  SOLOSTAR) 100 UNIT/ML Solostar Pen Inject 22-25 Units into the skin at bedtime. (Patient not taking: Reported on 01/09/2024) 15 mL PRN   nitroGLYCERIN  (NITROSTAT ) 0.4 MG SL tablet Place 1 tablet (0.4 mg total) under the tongue every 5 (five) minutes x 3 doses as needed for chest pain (if no relief after 3rd dose, proceed to the ED for  an evaluation or call 911). 25 tablet 3   No current facility-administered medications for this visit.    Allergies as of 01/09/2024 - Review Complete 01/09/2024  Allergen Reaction Noted   Bee venom Anaphylaxis and Hives 09/13/2019   Zocor  [simvastatin ] Other (See Comments) 03/25/2015   Clindamycin /lincomycin Rash 10/07/2012   Codeine Rash 10/07/2012   Penicillins Rash 10/07/2012   Sulfa antibiotics Rash 10/07/2012    Social History   Socioeconomic History   Marital status: Legally Separated    Spouse name: Not on file   Number of children: Not on file   Years of education: Not on file   Highest education level: Not on file  Occupational History   Not on file  Tobacco Use   Smoking status: Every Day    Current packs/day: 0.75    Average packs/day: 0.8 packs/day for 11.0 years (8.3 ttl pk-yrs)    Types: Cigarettes    Passive exposure: Never   Smokeless tobacco: Never   Tobacco comments:    10 ciggs per day   Vaping Use   Vaping status: Never Used  Substance and Sexual Activity   Alcohol use: Not Currently    Comment: occasionally   Drug use: No   Sexual activity: Not on file  Other Topics Concern   Not on file  Social History Narrative   Right handed   Caffeine~4-5 cups per day (diet sun drop)   Lives at home alone    Social Drivers of Health   Financial Resource Strain: Low Risk  (08/06/2022)   Overall Financial Resource Strain (CARDIA)    Difficulty of Paying Living Expenses: Not very hard  Food Insecurity: No Food Insecurity (08/06/2022)   Hunger Vital Sign    Worried About Running Out of Food in the Last Year: Never true    Ran Out of Food in the Last Year: Never true  Transportation Needs: No Transportation Needs (08/06/2022)   PRAPARE - Administrator, Civil Service (Medical): No    Lack of Transportation (Non-Medical): No  Physical Activity: Insufficiently Active (04/13/2018)   [facility],  [facility]   Exercise Vital  Sign    Days of Exercise per Week: 3 days    Minutes of Exercise per Session: 30 min  Stress: Stress Concern Present (08/06/2022)   [employer] of Occupational Health - Occupational Stress Questionnaire    Feeling of Stress : To some extent  Social Connections: Not on file    Review of systems General: negative for malaise, night sweats, fever, chills, +weight loss  Neck: Negative for lumps, goiter, pain and significant neck swelling Resp: Negative for cough, wheezing, dyspnea at rest CV: Negative for chest pain, leg swelling, palpitations, orthopnea GI: denies hematochezia, vomiting, diarrhea, dysphagia, odyonophagia, early satiety +constipation +nausea +melena +weight loss MSK: Negative for joint pain or swelling, back pain, and muscle pain. Derm: Negative for itching or rash Psych: Denies depression, anxiety, memory loss, confusion. No homicidal or suicidal ideation.  Heme: Negative for prolonged bleeding, bruising easily, and swollen nodes. Endocrine: Negative for cold or heat intolerance, polyuria, polydipsia and goiter. Neuro: negative for tremor, gait imbalance, syncope and seizures. The remainder of the review of systems is noncontributory.  Physical Exam: BP (!) 92/59   Pulse 87   Temp 98.2 F (36.8 C)   Ht 5\' 7"  (1.702 m)   Wt 140 lb 4.8 oz (63.6 kg)   BMI 21.97 kg/m  General:   Alert and oriented. No distress noted. Pleasant and cooperative.  Head:  Normocephalic and atraumatic. Eyes:  Conjuctiva clear without scleral icterus. Mouth:  Oral mucosa pink and moist. Good dentition. No lesions. Heart: Normal rate and rhythm, s1 and s2 heart sounds present.  Lungs: Clear lung sounds in all lobes. Respirations equal and unlabored. Abdomen:  +BS, soft, and non-distended. Generalized TTP. No rebound or guarding. No HSM or masses noted. Derm: No palmar erythema or jaundice Msk:  Symmetrical without gross deformities. Normal posture. Extremities:  Without  edema. Neurologic:  Alert and  oriented x4 Psych:  Alert and cooperative. Normal mood and affect.  Invalid input(s): "6 MONTHS"   ASSESSMENT: [name] is a 53 y.o. female presenting today for constipation, nausea, weight loss, melena  Patient with long history of constipation. Family history significant for Colon cancer in her sister at age 59. Her last TCS was 4 years ago. Notes worsening of constipation with last BM about 4 weeks ago. Has tried and failed many laxatives. she reports worsening of constipation recently without any precipitating factors, a 14 pound weight loss unintentionally and a melanotic stool. Constipation may likely be contributing to some of her symptomatology, though considering worsening constipation and significant weight loss, especially given her family history, I would recommend proceeding with colonoscopy and EGD as well due to weight loss, melena and nausea. For now will send bowel prep and advised her to start prucalopride 2mg  daily as well as miralax  BID thereafter. Indications, risks and benefits of procedure discussed in detail with patient. Patient verbalized understanding and is in agreement to proceed with  EGD/Colonoscopy   PLAN:  -bowel prep  -CBC, CMP, TSH -prucalopride 2mg  daily (start after bowel prep)  -miralax  1 capful BID  (start after bowel prep) -Increase water  intake, aim for atleast 64 oz per day -Increase fruits, veggies and whole grains, kiwi and prunes are especially good for constipation -consider MR defecography after colonoscopy if no acute findings  All questions were answered, patient verbalized understanding and is in agreement with plan as outlined above.    Follow Up: 2 months   Malissa Slay L. Jayana Kotula, MSN,  APRN, AGNP-C Adult-Gerontology Nurse Practitioner [facility]

## 2024-01-10 ENCOUNTER — Encounter: Payer: Self-pay | Admitting: Internal Medicine

## 2024-01-10 ENCOUNTER — Other Ambulatory Visit: Payer: Self-pay

## 2024-01-10 ENCOUNTER — Other Ambulatory Visit: Payer: Self-pay | Admitting: *Deleted

## 2024-01-10 ENCOUNTER — Telehealth (INDEPENDENT_AMBULATORY_CARE_PROVIDER_SITE_OTHER): Payer: 59 | Admitting: Internal Medicine

## 2024-01-10 DIAGNOSIS — Z4681 Encounter for fitting and adjustment of insulin pump: Secondary | ICD-10-CM | POA: Diagnosis not present

## 2024-01-10 DIAGNOSIS — E1065 Type 1 diabetes mellitus with hyperglycemia: Secondary | ICD-10-CM

## 2024-01-10 DIAGNOSIS — K921 Melena: Secondary | ICD-10-CM | POA: Diagnosis not present

## 2024-01-10 DIAGNOSIS — E1042 Type 1 diabetes mellitus with diabetic polyneuropathy: Secondary | ICD-10-CM

## 2024-01-10 DIAGNOSIS — R7989 Other specified abnormal findings of blood chemistry: Secondary | ICD-10-CM

## 2024-01-10 DIAGNOSIS — E782 Mixed hyperlipidemia: Secondary | ICD-10-CM | POA: Diagnosis not present

## 2024-01-10 DIAGNOSIS — K5900 Constipation, unspecified: Secondary | ICD-10-CM | POA: Diagnosis not present

## 2024-01-10 DIAGNOSIS — R634 Abnormal weight loss: Secondary | ICD-10-CM | POA: Diagnosis not present

## 2024-01-10 MED ORDER — OMNIPOD 5 G7 PODS (GEN 5) MISC: 1.0000 | 3 refills | Status: AC

## 2024-01-10 MED ORDER — DEXCOM G7 SENSOR MISC: 3 refills | Status: DC

## 2024-01-10 MED ORDER — PEG 3350-KCL-NA BICARB-NACL 420 G PO SOLR: 4000.0000 mL | Freq: Once | ORAL | 0 refills | Status: AC

## 2024-01-10 NOTE — Patient Instructions (Addendum)
 Please use the following pump settings: - basal rates: 12 am: 0.9 3 am: 0.9 11 am: 0.9 - ICR:              12 am: 1:11 >> 1:9 - target: 110-150  - ISF: 50 - Active insulin  time: 4 hours  Please do the following approximately 15 minutes before every meal and snack: - Enter carbs (C) - Enter sugars (S) - Start insulin  bolus (I)  Look up the iLET pump.  Please return in 3-4 months.

## 2024-01-10 NOTE — Progress Notes (Signed)
 Patient ID: Rhodesia Stanger, female   DOB: 05-Jun-1971, 53 y.o.   MRN: [id number]  Patient location: Home My location: Office Persons participating in the virtual visit: patient, provider  Referring Provider: Levada Raymond, PA  I connected with the patient on 01/10/24 at  1:40 PM EDT by a video enabled telemedicine application and verified that I am speaking with the correct person.   I discussed the limitations of evaluation and management by telemedicine and the availability of in person appointments. The patient expressed understanding and agreed to proceed.   Details of the encounter are shown below. HPI: Milli Woolridge is a 53 y.o.-year-old female, returning for f/u for DM1, dx in 48 (53 y/o), uncontrolled, with complications (cerebro-vascular ds - h/o TIA 0/2016, PN, DR). Last visit 4 months ago.  Insurance: [employer].  Interim history: No increased urination, blurry vision, chest pain.    She continues to have numbness and tingling in her feet. She has significant constipation, and lost ~40 lbs reportedly in last year. She has a colonoscopy next week so the appointment was switched to virtual as she is doing the prep.  Insulin  pump: -Previously OmniPod -Then Medtronic 670 G insulin  pump but could not afford the Enlite CGM -then T: slim X2-started 10/2019 -now Omnipod5 - started 06/30/2021 - likes this better (changed 10/2022).  She is using NovoLog  FlexPen the reservoir.  She prefers this.  CGM: -Dexcom G6   Insulin : -Sugars improved after her insurance switched coverage from Humalog  to NovoLog   Supplies:  -[place]  Reviewed HbA1c levels: Lab Results  Component Value Date   HGBA1C 9.0 (A) 08/23/2023   HGBA1C 8.7 03/29/2023   HGBA1C 9.0 (A) 11/25/2022   Previously on: - Lantus  25 units in am and 10 units at night - Novolog  ICR 1:17, target 150, ISF 1:25  On insulin  pump: Pump settings:  - basal rates: 12 am: 0.9 3 am: 0.911 am: 0.9 - ICR:               12 am: 1:10 >> 1:11 - target:  110-150 - ISF: 50 - Active insulin  time: 4 hours TDD from basal insulin : 17.1 units (75%) >> 77% >> 73% (21.4 units) TDD from bolus insulin : 5.7 units (25%) >> 23% >> 27% (8.1 units) Total daily dose: 25-50 units daily  She was previously also on: - Metformin  500 mg 2x a day - started 09/2021 -stopped after she started to be more fatigued, but she realized that this was not really related to metformin . She stopped Metformin  10/2022 2/2 weight loss.  She checks her sugars more than 4 times a day with her CGM (could not directly download the Dexcom report that she is not connected to clarity app):  Previously:  Previously:   Lowest sugar was 27 in 07/2014...>> 30s (!) - sensor pb. >> ... 40s >> LO. No hypoglycemia awareness!  She has a glucagon  pen at home. Highest sugar was 601 .. >> 1200 (!!!) ...  >> 400s >> 400s. No history of hypoglycemia admissions.   She  had hyperglycemia ED visits and an admission in 12/2017. She had a DKA admission in 09/2018 - with suspicion of stroke.  This was ruled out by MRI and she also had normal carotid Dopplers afterwards.   -No CKD, last BUN/creatinine:  07/14/2023: 6/0.82, GFR 86, glucose 163 Lab Results  Component Value Date   BUN 6 11/25/2022   CREATININE 0.92 11/25/2022   Lab Results  Component Value Date   MICRALBCREAT  1.0 11/25/2022   MICRALBCREAT 1.0 10/06/2021   MICRALBCREAT 3.0 12/26/2018   MICRALBCREAT 3.6 04/20/2018   MICRALBCREAT 4.5 05/17/2016  On Lisinopril  5 mg daily.  -+ HL; last set of lipids: 07/14/2023: 117/58/66/89 Lab Results  Component Value Date   CHOL 155 11/25/2022   HDL 45.40 11/25/2022   LDLCALC 89 11/25/2022   TRIG 105.0 11/25/2022   CHOLHDL 3 11/25/2022  On pravastatin  20 >> 40, fish oil 1200 mg daily.  - last eye exam was 08/08/2023: + DR.  -+ Numbness and tingling in feet and hands. She was previously on Neurontin  but she stopped due to side effects. Currently off  Lyrica .  Last foot exam 03/2023.  She has been following with Dr. Luster Salters, but I do not see comprehensive foot exams per review of the chart.  Reviewed B12 levels: Lab Results  Component Value Date   VITAMINB12 1,291 (H) 11/25/2022   VITAMINB12 >1504 (H) 10/06/2021   VITAMINB12 650 10/17/2020   VITAMINB12 >1526 (H) 04/04/2020   VITAMINB12 >1500 (H) 12/26/2018   VITAMINB12 481 12/06/2014  She stopped B12 in 2020.  No hypothyroidism: 07/15/2023: TSH 4.0 Lab Results  Component Value Date   TSH 2.53 11/25/2022   On Diazepam . Stopped Wellbutrin  due to constipation. She had tooth surgery in 01/2018, Dr. Alline Ivans.   She had bilateral big toe surgery in 09/2016 with Dr. Luster Salters.   ROS: + See HPI  I reviewed pt's medications, allergies, PMH, social hx, family hx, and changes were documented in the history of present illness. Otherwise, unchanged from my initial visit note.  Past Medical History:  Diagnosis Date   Benign paroxysmal vertigo    Cervicalgia    Depression    Diabetes mellitus (HCC)    GERD (gastroesophageal reflux disease)    Hiatal hernia    History of cardiac catheterization    Normal coronary arteries 2016 and 2021   History of TIA (transient ischemic attack)    Hyperlipidemia    Hypertension    IBS (irritable bowel syndrome)    Migraine headache    Panic disorder with agoraphobia    Peripheral neuropathy    Subungual hematoma of digit of hand    Tendonitis    Vertigo    Past Surgical History:  Procedure Laterality Date   ABDOMINAL HYSTERECTOMY     APPENDECTOMY     BREAST BIOPSY Right 12/01/2022   MM RT BREAST BX W LOC DEV 1ST LESION IMAGE BX SPEC STEREO GUIDE 12/01/2022 [facility]   CARDIAC CATHETERIZATION   last in 2009   x 4, normal coronary arteries   CARDIAC CATHETERIZATION N/A 03/26/2015   Procedure: Left Heart Cath and Coronary Angiography;  Surgeon: Wenona Hamilton, MD;  Location: [facility];  Service: Cardiovascular;  Laterality: N/A;    CARPAL TUNNEL RELEASE Right 08/24/2018   Procedure: CARPAL TUNNEL RELEASE;  Surgeon: Darrin Emerald, MD;  Location: [facility];  Service: Orthopedics;  Laterality: Right;   CESAREAN SECTION     CHOLECYSTECTOMY     COLONOSCOPY WITH PROPOFOL  N/A 01/09/2018   Procedure: COLONOSCOPY WITH PROPOFOL ;  Surgeon: Ruby Corporal, MD;  Location: [facility];  Service: Endoscopy;  Laterality: N/A;   COLONOSCOPY WITH PROPOFOL  N/A 09/21/2019   Procedure: COLONOSCOPY WITH PROPOFOL ;  Surgeon: Ruby Corporal, MD;  Location: [facility];  Service: Endoscopy;  Laterality: N/A;  1055   CYST EXCISION     right breast   ESOPHAGOGASTRODUODENOSCOPY (EGD) WITH PROPOFOL  N/A 01/09/2018   Procedure:  ESOPHAGOGASTRODUODENOSCOPY (EGD) WITH PROPOFOL ;  Surgeon: Ruby Corporal, MD;  Location: [facility];  Service: Endoscopy;  Laterality: N/A;   LEFT HEART CATH AND CORONARY ANGIOGRAPHY N/A 02/25/2020   Procedure: LEFT HEART CATH AND CORONARY ANGIOGRAPHY;  Surgeon: Millicent Ally, MD;  Location: [facility];  Service: Cardiovascular;  Laterality: N/A;   POLYPECTOMY  09/21/2019   Procedure: POLYPECTOMY;  Surgeon: Ruby Corporal, MD;  Location: [facility];  Service: Endoscopy;;  ascending colon;   TOE SURGERY Right    3 rd and 4 th toes   TOOTH EXTRACTION Bilateral 02/16/2018   Procedure: 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 RIGHT MAXILLARY SINUS ANTROSTOMY;  Surgeon: Ascencion Lava, DDS;  Location: [facility];  Service: Oral Surgery;  Laterality: Bilateral;   TUBAL LIGATION     History   Social History   Marital Status: Divorced    Spouse Name: N/A   Number of Children: 1   Occupational History   disabled   Social History Main Topics   Smoking status: Current Every Day Smoker -- 0.50 packs/day for 11 years   Smokeless tobacco: Never Used     Comment: patient is aware that she needs to quit smoking   Alcohol Use: No   Drug Use: No   Current Outpatient Medications on File Prior to  Visit  Medication Sig Dispense Refill   Calcium  Polycarbophil (FIBERCON PO) Take by mouth 3 (three) times daily.     Continuous Glucose Receiver (DEXCOM G7 RECEIVER) DEVI Use to monitor glucose continuously 1 each 0   Continuous Glucose Sensor (DEXCOM G7 SENSOR) MISC Use to check glucose continuously, change sensor every 10 days 9 each 3   Continuous Glucose Transmitter (DEXCOM G6 TRANSMITTER) MISC Use as directed change every 90 days. 1 each 3   diazepam  (VALIUM ) 5 MG tablet Take 5 mg by mouth 3 (three) times daily as needed for anxiety.      docusate sodium  (COLACE) 100 MG capsule Take 100-200 mg by mouth See admin instructions. Take 100 mg by mouth in the morning and 200 mg in the evening     EPIPEN  2-PAK 0.3 MG/0.3ML SOAJ injection Inject 0.3 mg into the muscle as needed for anaphylaxis.      Glucagon  3 MG/DOSE POWD Place 3 mg into the nose once as needed for up to 1 dose. 1 each 11   ibuprofen  (ADVIL ) 800 MG tablet TAKE 1 TABLET BY MOUTH 3 TIMES DAILY. (Patient taking differently: Take 800 mg by mouth as needed.) 90 tablet 0   Insulin  Aspart FlexPen (NOVOLOG ) 100 UNIT/ML imject up to 60 units in the pump daily. 60 mL 1   Insulin  Disposable Pump (OMNIPOD 5 G7 PODS, GEN 5,) MISC 1 each by Does not apply route every 3 (three) days. 30 each 3   Insulin  Pen Needle 32G X 4 MM MISC Use 1x a day 50 each 3   lisinopril  (PRINIVIL ,ZESTRIL ) 5 MG tablet Take 5 mg by mouth daily.  2   nitroGLYCERIN  (NITROSTAT ) 0.4 MG SL tablet Place 1 tablet (0.4 mg total) under the tongue every 5 (five) minutes x 3 doses as needed for chest pain (if no relief after 3rd dose, proceed to the ED for an evaluation or call 911). 25 tablet 3   promethazine  (PHENERGAN ) 25 MG tablet Take 1 tablet (25 mg total) by mouth every 6 (six) hours as needed for nausea or vomiting. 30 tablet 0   Prucalopride Succinate 2 MG TABS  Take 1 tablet (2 mg total) by mouth daily. 30 tablet 1   rosuvastatin (CRESTOR) 40 MG tablet Take 40 mg by mouth  daily.     venlafaxine XR (EFFEXOR-XR) 150 MG 24 hr capsule Take 150 mg by mouth daily.     No current facility-administered medications on file prior to visit.   Allergies  Allergen Reactions   Bee Venom Anaphylaxis and Hives   Zocor  [Simvastatin ] Other (See Comments)    Muscle aches and pains   Clindamycin /Lincomycin Rash   Codeine Rash   Penicillins Rash    Did it involve swelling of the face/tongue/throat, SOB, or low BP? No Did it involve sudden or severe rash/hives, skin peeling, or any reaction on the inside of your mouth or nose? Yes Did you need to seek medical attention at a hospital or doctor's office? Yes When did it last happen?      30 years If all above answers are "NO", may proceed with cephalosporin use.    Sulfa Antibiotics Rash        Family History  Problem Relation Age of Onset   Stroke Mother 51   Heart attack Mother 70   Cancer Sister        colon   Heart failure Maternal Grandmother 9   Cancer Maternal Grandfather        prostate   Heart failure Paternal Grandmother 42   Heart failure Paternal Grandfather 51   PE: There were no vitals taken for this visit.  Wt Readings from Last 3 Encounters:  01/09/24 140 lb 4.8 oz (63.6 kg)  09/02/23 144 lb (65.3 kg)  08/23/23 143 lb 6.4 oz (65 kg)   Constitutional:  in NAD  The physical exam was not performed (virtual visit).  ASSESSMENT: 1. DM1, uncontrolled, with complications - cerebro-vascular ds - h/o TIA 0/2016 - PN - DR Sees cardiology >> Dr. Gaylin Ke - [facility]  2. HL  3.  High B12  PLAN:  1. Patient with longstanding, uncontrolled, type 1 diabetes, on the OmniPod insulin  pump integrated with the Dexcom CGM, with still poor control.  Reviewed in the last 2 weeks of CGM download, the sugars appear to be worse compared to before.  Her average is 232.  Sugars are still decreasing overnight, with the best blood sugars in the morning and then increasing after every meal.  The main issue  remains that she is still not introducing enough carbs into the pump, with entire days in which she does not introduce any carbs and when she does, she may only introduce 25 to 45 g.  At last visit, we discussed that not entering carbs into the pump and doing more manual boluses was not conducive to good control.  I again advised her that until she starts doing so, we will not see better control of her diabetes.  We had the same discussion at every visit, but unfortunately, she has not been able to change her practice. - At last visit I did advise her to reduce her basal rate from 3 AM to 11 AM and she had more lows during this period of time.  However, at today's visit, reviewing her pump settings, the basal rates are still the same, and 0.9 throughout the day.  Moreover, her insulin  to carb ratios are actually even more relaxed than before, at 1: 11 - At today's visit, we discussed about possibly switching to the iLet insulin  pump, for which she does not need to reduce any carbs  but just announced the meals CGM interpretation: -At today's visit, we reviewed her CGM downloads: It appears that 21% of values are in target range (goal >70%), while 78% are higher than 180 (goal <25%), and 1% are lower than 70 (goal <4%).  The calculated average blood sugar is 232.  The projected HbA1c for the next 3 months (GMI) is 9.7% -Reviewing the CGM trends, sugars are improving overnight but then increasing after every meal, as she is not entering enough carbs for meals or skipping the carb entry completely.  She does have some instances in which she is entering carbs and the sugars still increase after these meals.  Therefore, for now, I suggested strengthening of her insulin  to carb ratios. - I suggested to:  Patient Instructions  Please use the following pump settings: - basal rates: 12 am: 0.9 3 am: 0.9 11 am: 0.9 - ICR:              12 am: 1:11 >> 1:9 - target: 110-150  - ISF: 50 - Active insulin  time: 4  hours  Please do the following approximately 15 minutes before every meal and snack: - Enter carbs (C) - Enter sugars (S) - Start insulin  bolus (I)  Look up the iLET pump.  Please return in 3-4 months.   - we we will check her HbA1c at next visit - advised to check sugars at different times of the day - 4x a day, rotating check times - advised for yearly eye exams >> she is UTD - return to clinic in 4 months  2. HL - Latest lipid panel (07/14/2023) showed an LDL above her goal of less than 55: 117/58/66/89 -She is on a statin 40 mg daily without side effects  3.  High B12 -Previously on B12 supplements but she came off afterwards - Latest B12 level was slightly high: Lab Results  Component Value Date   VITAMINB12 1,291 (H) 11/25/2022  -we will recheck this at next visit  Emilie Harden, MD PhD [facility] Endocrinology

## 2024-01-11 ENCOUNTER — Ambulatory Visit (INDEPENDENT_AMBULATORY_CARE_PROVIDER_SITE_OTHER): Payer: Self-pay | Admitting: Gastroenterology

## 2024-01-11 LAB — COMPREHENSIVE METABOLIC PANEL WITH GFR
AG Ratio: 2.1 (calc) (ref 1.0–2.5)
ALT: 27 U/L (ref 6–29)
AST: 26 U/L (ref 10–35)
Albumin: 4 g/dL (ref 3.6–5.1)
Alkaline phosphatase (APISO): 79 U/L (ref 37–153)
BUN: 11 mg/dL (ref 7–25)
CO2: 32 mmol/L (ref 20–32)
Calcium: 9.1 mg/dL (ref 8.6–10.4)
Chloride: 100 mmol/L (ref 98–110)
Creat: 0.9 mg/dL (ref 0.50–1.03)
Globulin: 1.9 g/dL (ref 1.9–3.7)
Glucose, Bld: 407 mg/dL — ABNORMAL HIGH (ref 65–99)
Potassium: 5.1 mmol/L (ref 3.5–5.3)
Sodium: 136 mmol/L (ref 135–146)
Total Bilirubin: 0.2 mg/dL (ref 0.2–1.2)
Total Protein: 5.9 g/dL — ABNORMAL LOW (ref 6.1–8.1)
eGFR: 76 mL/min/{1.73_m2} (ref >=60)

## 2024-01-11 LAB — CBC
HCT: 44.8 % (ref 35.0–45.0)
Hemoglobin: 14.2 g/dL (ref 11.7–15.5)
MCH: 31 pg (ref 27.0–33.0)
MCHC: 31.7 g/dL — ABNORMAL LOW (ref 32.0–36.0)
MCV: 97.8 fL (ref 80.0–100.0)
MPV: 11.1 fL (ref 7.5–12.5)
Platelets: 221 10*3/uL (ref 140–400)
RBC: 4.58 10*6/uL (ref 3.80–5.10)
RDW: 12.3 % (ref 11.0–15.0)
WBC: 8.2 10*3/uL (ref 3.8–10.8)

## 2024-01-11 LAB — TSH: TSH: 9.1 m[IU]/L — ABNORMAL HIGH

## 2024-01-13 ENCOUNTER — Encounter: Payer: Self-pay | Admitting: Internal Medicine

## 2024-01-16 ENCOUNTER — Other Ambulatory Visit: Payer: Self-pay

## 2024-01-16 ENCOUNTER — Emergency Department (HOSPITAL_COMMUNITY)

## 2024-01-16 ENCOUNTER — Emergency Department (HOSPITAL_COMMUNITY)
Admission: EM | Admit: 2024-01-16 | Discharge: 2024-01-16 | Disposition: A | Discharge status: Home / Self Care | Attending: Emergency Medicine | Admitting: Emergency Medicine

## 2024-01-16 ENCOUNTER — Telehealth (INDEPENDENT_AMBULATORY_CARE_PROVIDER_SITE_OTHER): Payer: Self-pay | Admitting: Gastroenterology

## 2024-01-16 DIAGNOSIS — R112 Nausea with vomiting, unspecified: Secondary | ICD-10-CM | POA: Diagnosis not present

## 2024-01-16 DIAGNOSIS — K5900 Constipation, unspecified: Secondary | ICD-10-CM | POA: Diagnosis not present

## 2024-01-16 DIAGNOSIS — Z794 Long term (current) use of insulin: Secondary | ICD-10-CM | POA: Diagnosis not present

## 2024-01-16 DIAGNOSIS — K56609 Unspecified intestinal obstruction, unspecified as to partial versus complete obstruction: Secondary | ICD-10-CM | POA: Diagnosis not present

## 2024-01-16 DIAGNOSIS — R1084 Generalized abdominal pain: Secondary | ICD-10-CM | POA: Diagnosis present

## 2024-01-16 DIAGNOSIS — R739 Hyperglycemia, unspecified: Secondary | ICD-10-CM | POA: Insufficient documentation

## 2024-01-16 DIAGNOSIS — E1165 Type 2 diabetes mellitus with hyperglycemia: Secondary | ICD-10-CM | POA: Diagnosis not present

## 2024-01-16 DIAGNOSIS — R109 Unspecified abdominal pain: Secondary | ICD-10-CM | POA: Diagnosis not present

## 2024-01-16 LAB — CBC WITH DIFFERENTIAL/PLATELET
Abs Immature Granulocytes: 0.01 10*3/uL (ref 0.00–0.07)
Basophils Absolute: 0.1 10*3/uL (ref 0.0–0.1)
Basophils Relative: 1 %
Eosinophils Absolute: 0.1 10*3/uL (ref 0.0–0.5)
Eosinophils Relative: 2 %
HCT: 43.9 % (ref 36.0–46.0)
Hemoglobin: 15 g/dL (ref 12.0–15.0)
Immature Granulocytes: 0 %
Lymphocytes Relative: 26 %
Lymphs Abs: 2 10*3/uL (ref 0.7–4.0)
MCH: 30.7 pg (ref 26.0–34.0)
MCHC: 34.2 g/dL (ref 30.0–36.0)
MCV: 90 fL (ref 80.0–100.0)
Monocytes Absolute: 0.6 10*3/uL (ref 0.1–1.0)
Monocytes Relative: 8 %
Neutro Abs: 5.1 10*3/uL (ref 1.7–7.7)
Neutrophils Relative %: 63 %
Platelets: 169 10*3/uL (ref 150–400)
RBC: 4.88 MIL/uL (ref 3.87–5.11)
RDW: 12.1 % (ref 11.5–15.5)
WBC: 7.9 10*3/uL (ref 4.0–10.5)
nRBC: 0 % (ref 0.0–0.2)

## 2024-01-16 LAB — URINALYSIS, ROUTINE W REFLEX MICROSCOPIC
Bacteria, UA: NONE SEEN
Bilirubin Urine: NEGATIVE
Glucose, UA: >=500 mg/dL — AB
Hgb urine dipstick: NEGATIVE
Ketones, ur: 20 mg/dL — AB
Leukocytes,Ua: NEGATIVE
Nitrite: NEGATIVE
Protein, ur: NEGATIVE mg/dL
Specific Gravity, Urine: 1.029 (ref 1.005–1.030)
pH: 6 (ref 5.0–8.0)

## 2024-01-16 LAB — CBG MONITORING, ED
Glucose-Capillary: 482 mg/dL — ABNORMAL HIGH (ref 70–99)
Glucose-Capillary: 558 mg/dL (ref 70–99)

## 2024-01-16 LAB — COMPREHENSIVE METABOLIC PANEL WITH GFR
ALT: 34 U/L (ref 0–44)
AST: 34 U/L (ref 15–41)
Albumin: 3.9 g/dL (ref 3.5–5.0)
Alkaline Phosphatase: 69 U/L (ref 38–126)
Anion gap: 10 (ref 5–15)
BUN: 6 mg/dL (ref 6–20)
CO2: 26 mmol/L (ref 22–32)
Calcium: 9 mg/dL (ref 8.9–10.3)
Chloride: 99 mmol/L (ref 98–111)
Creatinine, Ser: 0.77 mg/dL (ref 0.44–1.00)
GFR, Estimated: >60 mL/min (ref >60)
Glucose, Bld: 320 mg/dL — ABNORMAL HIGH (ref 70–99)
Potassium: 3.7 mmol/L (ref 3.5–5.1)
Sodium: 135 mmol/L (ref 135–145)
Total Bilirubin: 0.8 mg/dL (ref 0.0–1.2)
Total Protein: 6.8 g/dL (ref 6.5–8.1)

## 2024-01-16 LAB — BLOOD GAS, VENOUS
Acid-Base Excess: 0.7 mmol/L (ref 0.0–2.0)
Bicarbonate: 26.6 mmol/L (ref 20.0–28.0)
Drawn by: 7012
O2 Saturation: 58.1 %
Patient temperature: 36.7
pCO2, Ven: 45 mmHg (ref 44–60)
pH, Ven: 7.37 (ref 7.25–7.43)
pO2, Ven: <31 mmHg — CL (ref 32–45)

## 2024-01-16 LAB — LIPASE, BLOOD: Lipase: 24 U/L (ref 11–51)

## 2024-01-16 MED ORDER — LACTULOSE 10 GM/15ML PO SOLN: 30.0000 g | Freq: Every day | ORAL | 0 refills | Status: DC | PRN

## 2024-01-16 MED ORDER — KETOROLAC TROMETHAMINE 15 MG/ML IJ SOLN
15.0000 mg | Freq: Once | INTRAMUSCULAR | Status: AC
  Administered 2024-01-16: 15 mg via INTRAVENOUS
  Filled 2024-01-16: qty 1

## 2024-01-16 MED ORDER — FENTANYL CITRATE PF 50 MCG/ML IJ SOSY
50.0000 ug | PREFILLED_SYRINGE | Freq: Once | INTRAMUSCULAR | Status: AC
  Filled 2024-01-16: qty 1

## 2024-01-16 MED ORDER — INSULIN ASPART 100 UNIT/ML IJ SOLN
10.0000 [IU] | Freq: Once | INTRAMUSCULAR | Status: AC
  Administered 2024-01-16: 10 [IU] via INTRAVENOUS
  Filled 2024-01-16: qty 1

## 2024-01-16 MED ORDER — SODIUM CHLORIDE 0.9 % IV BOLUS
1000.0000 mL | Freq: Once | INTRAVENOUS | Status: AC
  Administered 2024-01-16: 1000 mL via INTRAVENOUS

## 2024-01-16 MED ORDER — IOHEXOL 300 MG/ML  SOLN
100.0000 mL | Freq: Once | INTRAMUSCULAR | Status: AC | PRN
Start: 2024-01-16 — End: 2024-01-16
  Administered 2024-01-16: 100 mL via INTRAVENOUS

## 2024-01-16 MED ORDER — SODIUM CHLORIDE 0.9 % IV SOLN
12.5000 mg | Freq: Once | INTRAVENOUS | Status: AC
  Administered 2024-01-16: 12.5 mg via INTRAVENOUS
  Filled 2024-01-16: qty 0.5

## 2024-01-16 NOTE — ED Triage Notes (Signed)
 Pt arrived via POV c/o generalized abdominal pain and back pain. Pt recommenced to go to the ER for evaluation of possible bowel obstruction. Pt reports she has been having problems having BMs and endorses recent N/V as well.

## 2024-01-16 NOTE — ED Provider Notes (Signed)
 [place] [facility] DEPARTMENT AT [facility] Provider Note   CSN: [id number] Arrival date & time: 01/16/24  1219     Patient presents with: Abdominal Pain   [name] is a 53 y.o. female.   Patient is a 53 yo female presenting for generalized abdominal pain with radiation to the low back, constipation with last bm five days ago, and new nausea/vomiting. Denies prior hx of bowel obstructions. Prior abdominal surgeries include appendectomy, cholecystectomy, total hysterectomy, and a cesarean section. Currently following with [facility] with Dr. Sammi Crick. Received bowel prep for upper endoscopy and colonoscopy planned for last Friday but unable to have BM's. Pt started having vomiting after bowel prep Friday. Difficulty tolerating PO since due to n/v.  Family hx: Father Crohn's disease Sister died of colon cancer at 13  The history is provided by the patient. No language interpreter was used.  Abdominal Pain Associated symptoms: constipation, nausea and vomiting   Associated symptoms: no chest pain, no chills, no cough, no dysuria, no fever, no hematuria, no shortness of breath and no sore throat        Prior to Admission medications   Medication Sig Start Date End Date Taking? Authorizing Provider  lactulose (CHRONULAC) 10 GM/15ML solution Take 45 mLs (30 g total) by mouth daily as needed for up to 5 days for moderate constipation. 01/16/24 01/21/24 Yes Owen Blowers P, DO  Calcium  Polycarbophil (FIBERCON PO) Take by mouth 3 (three) times daily.    [provider]  Continuous Glucose Receiver (DEXCOM G7 RECEIVER) DEVI Use to monitor glucose continuously 10/05/23   Emilie Harden, MD  Continuous Glucose Sensor (DEXCOM G7 SENSOR) MISC Use to check glucose continuously, change sensor every 10 days 01/10/24   Emilie Harden, MD  Continuous Glucose Transmitter (DEXCOM G6 TRANSMITTER) MISC Use as directed change every 90  days. 09/20/23   Emilie Harden, MD  diazepam  (VALIUM ) 5 MG tablet Take 5 mg by mouth 3 (three) times daily as needed for anxiety.     [provider]  docusate sodium  (COLACE) 100 MG capsule Take 100-200 mg by mouth See admin instructions. Take 100 mg by mouth in the morning and 200 mg in the evening    [provider]  EPIPEN  2-PAK 0.3 MG/0.3ML SOAJ injection Inject 0.3 mg into the muscle as needed for anaphylaxis.  02/13/16   [provider]  Glucagon  3 MG/DOSE POWD Place 3 mg into the nose once as needed for up to 1 dose. 03/29/23   Emilie Harden, MD  ibuprofen  (ADVIL ) 800 MG tablet TAKE 1 TABLET BY MOUTH 3 TIMES DAILY. Patient taking differently: Take 800 mg by mouth as needed. 08/13/22   Dot Gazella, DPM  Insulin  Aspart FlexPen (NOVOLOG ) 100 UNIT/ML imject up to 60 units in the pump daily. 07/08/23   Emilie Harden, MD  Insulin  Disposable Pump (OMNIPOD 5 G7 PODS, GEN 5,) MISC 1 each by Does not apply route every 3 (three) days. 01/10/24   Emilie Harden, MD  Insulin  Pen Needle 32G X 4 MM MISC Use 1x a day 08/24/23   Emilie Harden, MD  lisinopril  (PRINIVIL ,ZESTRIL ) 5 MG tablet Take 5 mg by mouth daily. 06/10/15   [provider]  nitroGLYCERIN  (NITROSTAT ) 0.4 MG SL tablet Place 1 tablet (0.4 mg total) under the tongue every 5 (five) minutes x 3 doses as needed for chest pain (if no relief after 3rd dose, proceed to the ED for an evaluation or call 911). 11/02/22  01/31/23  Lasalle Pointer, NP  promethazine  (PHENERGAN ) 25 MG tablet Take 1 tablet (25 mg total) by mouth every 6 (six) hours as needed for nausea or vomiting. 05/24/22   Dot Gazella, DPM  Prucalopride Succinate  2 MG TABS Take 1 tablet (2 mg total) by mouth daily. 01/09/24   Carlan, Chelsea L, NP  rosuvastatin (CRESTOR) 40 MG tablet Take 40 mg by mouth daily.    [provider]  venlafaxine XR (EFFEXOR-XR) 150 MG 24 hr capsule Take 150 mg by mouth daily. 01/09/24   [provider]    Allergies: Bee venom, Zocor  [simvastatin ], Clindamycin /lincomycin, Codeine, Penicillins, and Sulfa antibiotics    Review of Systems  Constitutional:  Negative for chills and fever.  HENT:  Negative for ear pain and sore throat.   Eyes:  Negative for pain and visual disturbance.  Respiratory:  Negative for cough and shortness of breath.   Cardiovascular:  Negative for chest pain and palpitations.  Gastrointestinal:  Positive for abdominal pain, constipation, nausea and vomiting.  Genitourinary:  Negative for dysuria and hematuria.  Musculoskeletal:  Negative for arthralgias and back pain.  Skin:  Negative for color change and rash.  Neurological:  Negative for seizures and syncope.  All other systems reviewed and are negative.   Updated Vital Signs BP 112/60   Pulse 91   Temp 98 F (36.7 C) (Oral)   Resp 16   Ht 5' 7 (1.702 m)   Wt 63.6 kg   SpO2 95%   BMI 21.96 kg/m   Physical Exam Vitals and nursing note reviewed.  Constitutional:      General: She is not in acute distress.    Appearance: She is well-developed.  HENT:     Head: Normocephalic and atraumatic.   Eyes:     Conjunctiva/sclera: Conjunctivae normal.    Cardiovascular:     Rate and Rhythm: Normal rate and regular rhythm.     Heart sounds: No murmur heard. Pulmonary:     Effort: Pulmonary effort is normal. No respiratory distress.     Breath sounds: Normal breath sounds.  Abdominal:     Palpations: Abdomen is soft.     Tenderness: There is generalized abdominal tenderness. There is no guarding or rebound.   Musculoskeletal:        General: No swelling.     Cervical back: Neck supple.   Skin:    General: Skin is warm and dry.     Capillary Refill: Capillary refill takes less than 2 seconds.   Neurological:     Mental Status: She is alert.   Psychiatric:        Mood and Affect: Mood normal.     (all labs ordered are listed, but only abnormal results are displayed) Labs Reviewed   COMPREHENSIVE METABOLIC PANEL WITH GFR - Abnormal; Notable for the following components:      Result Value   Glucose, Bld 320 (*)    All other components within normal limits  URINALYSIS, ROUTINE W REFLEX MICROSCOPIC - Abnormal; Notable for the following components:   Color, Urine STRAW (*)    Glucose, UA >=500 (*)    Ketones, ur 20 (*)    All other components within normal limits  BLOOD GAS, VENOUS - Abnormal; Notable for the following components:   pO2, Ven <31 (*)    All other components within normal limits  CBG MONITORING, ED - Abnormal; Notable for the following components:   Glucose-Capillary 482 (*)    All  other components within normal limits  LIPASE, BLOOD  CBC WITH DIFFERENTIAL/PLATELET  CBG MONITORING, ED    EKG: None  Radiology: CT ABDOMEN PELVIS W CONTRAST Result Date: 01/16/2024 CLINICAL DATA:  Abdominal pain, acute, nonlocalized concern for bowel obstruction. constipation, abd pain, nausea, vomiting EXAM: CT ABDOMEN AND PELVIS WITH CONTRAST TECHNIQUE: Multidetector CT imaging of the abdomen and pelvis was performed using the standard protocol following bolus administration of intravenous contrast. RADIATION DOSE REDUCTION: This exam was performed according to the departmental dose-optimization program which includes automated exposure control, adjustment of the mA and/or kV according to patient size and/or use of iterative reconstruction technique. CONTRAST:  OMNIPAQUE  IOHEXOL  300 MG/ML  SOLN COMPARISON:  January 06, 2018 FINDINGS: Lower chest: No focal airspace consolidation or pleural effusion.Subsegmental atelectasis in the medial right middle lobe. Hepatobiliary: No mass.Focal fatty infiltration along the falciform ligament.Cholecystectomy. No intrahepatic or extrahepatic biliary ductal dilation. The portal veins are patent. Pancreas: No mass or main ductal dilation. No peripancreatic inflammation or fluid collection. Spleen: Normal size. No mass. Adrenals/Urinary  Tract: No adrenal masses. No renal mass. No nephrolithiasis or hydronephrosis. The urinary bladder is completely decompressed. Stomach/Bowel: The stomach is decompressed without focal abnormality. No small bowel wall thickening or inflammation. No small bowel obstruction.The appendix was not visualized. No right lower quadrant or pericecal inflammatory changes to suggest acute appendicitis. Vascular/Lymphatic: No aortic aneurysm. Scattered aortoiliac atherosclerosis. No intraabdominal or pelvic lymphadenopathy. Reproductive: Hysterectomy. No concerning adnexal mass.No free pelvic fluid. Other: No pneumoperitoneum, ascites, or mesenteric inflammation. Musculoskeletal: No acute fracture or destructive lesion. Multilevel thoracic osteophytosis. IMPRESSION: No acute intra-abdominal or pelvic abnormality. Electronically Signed   By: Rance Burrows M.D.   On: 01/16/2024 19:03   DG Abdomen 1 View Result Date: 01/16/2024 CLINICAL DATA:  Abdominal pain and pressure EXAM: ABDOMEN - 1 VIEW COMPARISON:  None Available. FINDINGS: The bowel gas pattern is normal. No radio-opaque calculi or other significant radiographic abnormality are seen. IMPRESSION: Moderate amount of residual fecal material throughout the colon without obstruction or impaction Electronically Signed   By: Fredrich Jefferson M.D.   On: 01/16/2024 13:27     Procedures   Medications Ordered in the ED  promethazine  (PHENERGAN ) 12.5 mg in sodium chloride  0.9 % 50 mL IVPB (has no administration in time range)  sodium chloride  0.9 % bolus 1,000 mL (1,000 mLs Intravenous Bolus from Bag 01/16/24 1821)  ketorolac  (TORADOL ) 15 MG/ML injection 15 mg (15 mg Intravenous Given 01/16/24 1822)  iohexol  (OMNIPAQUE ) 300 MG/ML solution 100 mL (100 mLs Intravenous Contrast Given 01/16/24 1832)  fentaNYL  (SUBLIMAZE ) injection 50 mcg (50 mcg Intravenous Given 01/16/24 1949)  insulin  aspart (novoLOG ) injection 10 Units (10 Units Intravenous Given 01/16/24 2003)                                     Medical Decision Making Amount and/or Complexity of Data Reviewed Labs: ordered. Radiology: ordered.  Risk Prescription drug management.    53 yo female presenting for generalized abdominal pain with radiation to the low back, constipation with last bm five days ago, and new nausea/vomiting.  Patient is in routine x 3, no acute distress, afebrile, stable vital signs.  Abdomen is soft but has generalized tenderness.  No guarding or distention.  Laboratory studies are stable without any leukocytosis or signs or symptoms of sepsis.  CT abdomen and pelvis with IV contrast demonstrates no bowel obstruction.  No infection.  No acute life any etiologies of abdominal pain, nausea, vomiting.  Moderate stool burden.  Patient given Phenergan  for nausea and vomiting had improvement of symptoms.  She was given Toradol  with minimal improvement of pain.  She was given fentanyl  with more improvement.  Patient was discharged home with prescription for lactulose for constipation.  She already has follow-up with her GI specialist with plans for upper endoscopy and colonoscopy soon.  Patient also hyperglycemic in the 400s.  States she is not been using her insulin  since she has been here in the emergency department because she was asked to take her pump off for imaging.  Insulin  given in ED.  Stable pH.  Stable anion gap.  No DKA.  Patient in no distress and overall condition improved here in the ED. Detailed discussions were had with the patient regarding current findings, and need for close f/u with PCP or on call doctor. The patient has been instructed to return immediately if the symptoms worsen in any way for re-evaluation. Patient verbalized understanding and is in agreement with current care plan. All questions answered prior to discharge.      Final diagnoses:  Hyperglycemia  Constipation, unspecified constipation type  Abdominal pain, unspecified abdominal location  Nausea and  vomiting, unspecified vomiting type    ED Discharge Orders          Ordered    lactulose (CHRONULAC) 10 GM/15ML solution  Daily PRN        01/16/24 2024               Quinn Bucco, DO 01/16/24 2031

## 2024-01-16 NOTE — ED Notes (Addendum)
 Spoke with [facility] regarding Phenergan  medication to be mixed from pharmacy.

## 2024-01-16 NOTE — Inpatient Diabetes Management (Signed)
 Inpatient Diabetes Program Recommendations  AACE/ADA: New Consensus Statement on Inpatient Glycemic Control (2015)  Target Ranges:  Prepandial:   less than 140 mg/dL      Peak postprandial:   less than 180 mg/dL (1-2 hours)      Critically ill patients:  140 - 180 mg/dL   Lab Results  Component Value Date   GLUCAP 199 (H) 03/16/2022   HGBA1C 9.0 (A) 08/23/2023    Review of Glycemic Control  Diabetes history: DM1 Outpatient Diabetes medications: OmniPod with Dexcom G7 Current orders for Inpatient glycemic control: No orders thus far  Insulin  pump settings: Basal - 0.9 units x 24H Bolus - ICR 1.9 Target 110-150 ISF - 50  Endo: [place]  Will need insulin  to prevent going into DKA  Inpatient Diabetes Program Recommendations:    If insulin  pump is off, pt will need both basal and correction + meal coverage insulin .  Semglee  20 units at bedtime  Novolog  0-9 units TID with meals and 0-5 HS  Novolog  6 units TID with meals if eating > 50%  If pump is on, please order Insulin  pump order set.  Follow-up in am.  Thank you. Joni Net, RD, LDN, CDCES Inpatient Diabetes Coordinator [phone]

## 2024-01-16 NOTE — Telephone Encounter (Signed)
 Pt contacted. Pt made aware of provider recommendations and pt verbalized understanding.

## 2024-01-16 NOTE — Telephone Encounter (Signed)
 Pt left voicemail stating that Joie Narrow wanted her to call to let her know how prep went last week. Pt states she did not do good, did not have a lot of BM's. Last Friday she vomited bile, pt states she is in excruciating pain in stomach and lower back. Also nauseated.  Returned call to pt. Pt states this morning she is not feeling good. Tired, nauseated, and in pain. Pt is not taking anything for nausea. Pt would like recommendations on what to do. Pt had TCS scheduled for 6/20. Please advise. Thank you!

## 2024-01-17 ENCOUNTER — Telehealth (INDEPENDENT_AMBULATORY_CARE_PROVIDER_SITE_OTHER): Payer: Self-pay

## 2024-01-17 ENCOUNTER — Other Ambulatory Visit (INDEPENDENT_AMBULATORY_CARE_PROVIDER_SITE_OTHER): Payer: Self-pay | Admitting: Gastroenterology

## 2024-01-17 MED ORDER — MAGNESIUM CITRATE PO SOLN
2.0000 | Freq: Once | ORAL | 0 refills | Status: AC
Start: 2024-01-17 — End: 2024-01-17

## 2024-01-17 MED ORDER — PEG 3350-KCL-NA BICARB-NACL 420 G PO SOLR: 4000.0000 mL | Freq: Once | ORAL | 0 refills | Status: AC

## 2024-01-17 NOTE — Telephone Encounter (Signed)
 Patient called says she ended up at the Ed on 01/16/2024 due to abdominal pain. Patient would like to know if she still needs to proceed with Egd and Tcs on 01/20/2024? She was sent home with lactulose, which she has not picked up from the pharmacy. She would like to know if she supposed to take the Lactulose up until the procedures on Friday?   Also patient says no prep was sent to [facility] in preparation for the upcoming Tcs.  She says she had a prep that she used last week to clean her out. Please advise. She says she still has not had a bm since she took this prep last week.   Per Ed visit dated 01/16/2024  Patient is a 53 yo female presenting for generalized abdominal pain with radiation to the low back, constipation with last bm five days ago, and new nausea/vomiting. Denies prior hx of bowel obstructions. Prior abdominal surgeries include appendectomy, cholecystectomy, total hysterectomy, and a cesarean section. Currently following with [facility] with Dr. Sammi Crick. Received bowel prep for upper endoscopy and colonoscopy planned for last Friday but unable to have BM's. Pt started having vomiting after bowel prep Friday. Difficulty tolerating PO since due to n/v.    53 yo female presenting for generalized abdominal pain with radiation to the low back, constipation with last bm five days ago, and new nausea/vomiting.  Patient is in routine x 3, no acute distress, afebrile, stable vital signs.  Abdomen is soft but has generalized tenderness.  No guarding or distention.  Laboratory studies are stable without any leukocytosis or signs or symptoms of sepsis.  CT abdomen and pelvis with IV contrast demonstrates no bowel obstruction.  No infection.  No acute life any etiologies of abdominal pain, nausea, vomiting.  Moderate stool burden.  Patient given Phenergan  for nausea and vomiting had improvement of symptoms.  She was given Toradol  with minimal improvement of pain.   She was given fentanyl  with more improvement.  Patient was discharged home with prescription for lactulose for constipation.  She already has follow-up with her GI specialist with plans for upper endoscopy and colonoscopy soon.

## 2024-01-17 NOTE — Telephone Encounter (Signed)
 Spoke to the patient today.  She actually reported having some small bowel movements with bowel prep but still not presenting significant amount of bowel movements.  Will still proceed with EGD and colonoscopy.  I advised her to not take lactulose but to continue MiraLAX  3 capfuls per day and Motegrity , but will give 1 dose of magnesium  citrate today and tomorrow, and will give a new bowel prep (sent to pharmacy).  Also advised to start full liquid diet today and then switch to clear liquid the day before procedure.

## 2024-01-17 NOTE — Telephone Encounter (Signed)
 See encounter for 6/10 order. Rx for prep was sent in. The pharamcy most likely placed on file as I advised the patient they would since [facility] had sent in rx for her to do one now.

## 2024-01-20 ENCOUNTER — Ambulatory Visit (HOSPITAL_COMMUNITY)
Admission: RE | Admit: 2024-01-20 | Discharge: 2024-01-20 | Disposition: A | Discharge status: Home / Self Care | Attending: Gastroenterology | Admitting: Gastroenterology

## 2024-01-20 ENCOUNTER — Encounter (HOSPITAL_COMMUNITY)
Admission: RE | Disposition: A | Discharge status: Home / Self Care | Payer: Self-pay | Source: Home / Self Care | Attending: Gastroenterology

## 2024-01-20 ENCOUNTER — Ambulatory Visit (HOSPITAL_COMMUNITY): Admitting: Anesthesiology

## 2024-01-20 ENCOUNTER — Encounter (HOSPITAL_COMMUNITY): Payer: Self-pay | Admitting: Gastroenterology

## 2024-01-20 ENCOUNTER — Other Ambulatory Visit: Payer: Self-pay

## 2024-01-20 DIAGNOSIS — E119 Type 2 diabetes mellitus without complications: Secondary | ICD-10-CM | POA: Diagnosis not present

## 2024-01-20 DIAGNOSIS — F32A Depression, unspecified: Secondary | ICD-10-CM | POA: Insufficient documentation

## 2024-01-20 DIAGNOSIS — K921 Melena: Secondary | ICD-10-CM

## 2024-01-20 DIAGNOSIS — I10 Essential (primary) hypertension: Secondary | ICD-10-CM | POA: Insufficient documentation

## 2024-01-20 DIAGNOSIS — Z6821 Body mass index (BMI) 21.0-21.9, adult: Secondary | ICD-10-CM | POA: Insufficient documentation

## 2024-01-20 DIAGNOSIS — Z800 Family history of malignant neoplasm of digestive organs: Secondary | ICD-10-CM | POA: Diagnosis not present

## 2024-01-20 DIAGNOSIS — K648 Other hemorrhoids: Secondary | ICD-10-CM | POA: Insufficient documentation

## 2024-01-20 DIAGNOSIS — Z794 Long term (current) use of insulin: Secondary | ICD-10-CM | POA: Diagnosis not present

## 2024-01-20 DIAGNOSIS — R634 Abnormal weight loss: Secondary | ICD-10-CM | POA: Insufficient documentation

## 2024-01-20 DIAGNOSIS — E1042 Type 1 diabetes mellitus with diabetic polyneuropathy: Secondary | ICD-10-CM | POA: Diagnosis not present

## 2024-01-20 DIAGNOSIS — K581 Irritable bowel syndrome with constipation: Secondary | ICD-10-CM | POA: Insufficient documentation

## 2024-01-20 DIAGNOSIS — F17210 Nicotine dependence, cigarettes, uncomplicated: Secondary | ICD-10-CM

## 2024-01-20 DIAGNOSIS — K219 Gastro-esophageal reflux disease without esophagitis: Secondary | ICD-10-CM | POA: Diagnosis not present

## 2024-01-20 DIAGNOSIS — K5900 Constipation, unspecified: Secondary | ICD-10-CM

## 2024-01-20 DIAGNOSIS — R109 Unspecified abdominal pain: Secondary | ICD-10-CM | POA: Diagnosis not present

## 2024-01-20 DIAGNOSIS — Z1211 Encounter for screening for malignant neoplasm of colon: Secondary | ICD-10-CM | POA: Diagnosis not present

## 2024-01-20 DIAGNOSIS — E785 Hyperlipidemia, unspecified: Secondary | ICD-10-CM | POA: Insufficient documentation

## 2024-01-20 DIAGNOSIS — K3189 Other diseases of stomach and duodenum: Secondary | ICD-10-CM | POA: Diagnosis not present

## 2024-01-20 DIAGNOSIS — R110 Nausea: Secondary | ICD-10-CM | POA: Diagnosis not present

## 2024-01-20 HISTORY — PX: COLONOSCOPY: SHX5424

## 2024-01-20 HISTORY — PX: ESOPHAGOGASTRODUODENOSCOPY: SHX5428

## 2024-01-20 LAB — HM COLONOSCOPY

## 2024-01-20 LAB — GLUCOSE, CAPILLARY: Glucose-Capillary: 294 mg/dL — ABNORMAL HIGH (ref 70–99)

## 2024-01-20 SURGERY — COLONOSCOPY
Anesthesia: General

## 2024-01-20 MED ORDER — PROPOFOL 500 MG/50ML IV EMUL
INTRAVENOUS | Status: DC | PRN
  Administered 2024-01-20: 140 mg via INTRAVENOUS
  Administered 2024-01-20: 100 ug/kg/min via INTRAVENOUS
  Administered 2024-01-20: 20 mg via INTRAVENOUS
  Administered 2024-01-20: 60 mg via INTRAVENOUS

## 2024-01-20 MED ORDER — LACTATED RINGERS IV SOLN: INTRAVENOUS | Status: DC

## 2024-01-20 MED ORDER — LIDOCAINE 2% (20 MG/ML) 5 ML SYRINGE
INTRAMUSCULAR | Status: DC | PRN
  Administered 2024-01-20: 80 mg via INTRAVENOUS

## 2024-01-20 NOTE — Transfer of Care (Signed)
 Immediate Anesthesia Transfer of Care Note  Patient: [name]  Procedure(s) Performed: COLONOSCOPY EGD (ESOPHAGOGASTRODUODENOSCOPY)  Patient Location: Endoscopy Unit  Anesthesia Type:General  Level of Consciousness: drowsy  Airway & Oxygen  Therapy: Patient Spontanous Breathing  Post-op Assessment: Report given to RN and Post -op Vital signs reviewed and stable  Post vital signs: Reviewed and stable  Last Vitals:  Vitals Value Taken Time  BP 87/47 01/20/24 10:08  Temp 36.4 C 01/20/24 10:08  Pulse 67 01/20/24 10:08  Resp 20 01/20/24 10:08  SpO2 97 % 01/20/24 10:08    Last Pain:  Vitals:   01/20/24 1008  TempSrc: Axillary  PainSc: 0-No pain      Patients Stated Pain Goal: 5 (01/20/24 0807)  Complications: No notable events documented.

## 2024-01-20 NOTE — Anesthesia Procedure Notes (Signed)
 Date/Time: 01/20/2024 9:20 AM  Performed by: Verline Glow, CRNAPre-anesthesia Checklist: Patient identified, Emergency Drugs available, Suction available, Timeout performed and Patient being monitored Patient Re-evaluated:Patient Re-evaluated prior to induction Oxygen  Delivery Method: Nasal Cannula

## 2024-01-20 NOTE — Op Note (Signed)
 [facility] Patient Name: [name] Procedure Date: 01/20/2024 9:16 AM MRN: [id number] Date of Birth: 24-May-1971 Attending MD: Samantha Cress , , [id number] CSN: [id number] Age: 53 Admit Type: Outpatient Procedure:                Upper GI endoscopy Indications:              Abdominal pain Providers:                Samantha Cress, Troy Furnish. Hazeline Lister RN, RN, Alisa App, [place] Butter, Technician Referring MD:              Medicines:                Monitored Anesthesia Care Complications:            No immediate complications. Estimated Blood Loss:     Estimated blood loss: none. Procedure:                Pre-Anesthesia Assessment:                           - Prior to the procedure, a History and Physical                            was performed, and patient medications, allergies                            and sensitivities were reviewed. The patient's                            tolerance of previous anesthesia was reviewed.                           - The risks and benefits of the procedure and the                            sedation options and risks were discussed with the                            patient. All questions were answered and informed                            consent was obtained.                           - ASA Grade Assessment: II - A patient with mild                            systemic disease.                           After obtaining informed consent, the endoscope was                            passed under direct vision. Throughout  the                            procedure, the patient's blood pressure, pulse, and                            oxygen  saturations were monitored continuously. The                            [id number]-[id number] ([id number]) scope was introduced through the                            mouth, and advanced to the second part of duodenum.                            The upper GI endoscopy was accomplished  without                            difficulty. The patient tolerated the procedure                            well. Scope In: 9:29:35 AM Scope Out: 9:34:50 AM Total Procedure Duration: 0 hours 5 minutes 15 seconds  Findings:      The examined esophagus was normal.      The Z-line was regular and was found 40 cm from the incisors.      The entire examined stomach was normal. Biopsies were taken with a cold       forceps for Helicobacter pylori testing.      The examined duodenum was normal. Biopsies were taken with a cold       forceps for histology. Impression:               - Normal esophagus.                           - Z-line regular, 40 cm from the incisors.                           - Normal stomach. Biopsied.                           - Normal examined duodenum. Biopsied. Moderate Sedation:      Per Anesthesia Care Recommendation:           - Discharge patient to home (ambulatory).                           - Resume previous diet.                           - Await pathology results. Procedure Code(s):        --- Professional ---                           [phone], Esophagogastroduodenoscopy, flexible,  transoral; with biopsy, single or multiple Diagnosis Code(s):        --- Professional ---                           R10.9, Unspecified abdominal pain CPT copyright 2022 American Medical Association. All rights reserved. The codes documented in this report are preliminary and upon coder review may  be revised to meet current compliance requirements. Samantha Cress, MD Samantha Cress,  01/20/2024 9:38:33 AM This report has been signed electronically. Number of Addenda: 0

## 2024-01-20 NOTE — Discharge Instructions (Addendum)
 You are being discharged to home.  Resume your previous diet.  We are waiting for your pathology results.  Your physician has recommended a repeat colonoscopy in five years for screening purposes.  Continue your present medications.  Proceed with MR defecography.

## 2024-01-20 NOTE — Interval H&P Note (Signed)
 History and Physical Interval Note:  01/20/2024 8:11 AM  [name]  has presented today for surgery, with the diagnosis of melena, nausea, wt loss, signinficant constipation, family hx colorectal cancer,.  The various methods of treatment have been discussed with the patient and family. After consideration of risks, benefits and other options for treatment, the patient has consented to  Procedure(s) with comments: COLONOSCOPY (N/A) - 930am, asa 2 EGD (ESOPHAGOGASTRODUODENOSCOPY) (N/A) as a surgical intervention.  The patient's history has been reviewed, patient examined, no change in status, stable for surgery.  I have reviewed the patient's chart and labs.  Questions were answered to the patient's satisfaction.     [name]

## 2024-01-20 NOTE — Op Note (Signed)
 [facility] Patient Name: [name] Procedure Date: 01/20/2024 9:15 AM MRN: [id number] Date of Birth: March 24, 1971 Attending MD: Samantha Cress , , [id number] CSN: [id number] Age: 53 Admit Type: Outpatient Procedure:                Colonoscopy Indications:              Family history of colon cancer in a first-degree                            relative before age 64 years, Abdominal pain,                            Constipation Providers:                Samantha Cress, Troy Furnish. Hazeline Lister RN, RN, Alisa App, [place] Butter, Technician Referring MD:              Medicines:                Monitored Anesthesia Care Complications:            No immediate complications. Estimated Blood Loss:     Estimated blood loss: none. Procedure:                Pre-Anesthesia Assessment:                           - Prior to the procedure, a History and Physical                            was performed, and patient medications, allergies                            and sensitivities were reviewed. The patient's                            tolerance of previous anesthesia was reviewed.                           - The risks and benefits of the procedure and the                            sedation options and risks were discussed with the                            patient. All questions were answered and informed                            consent was obtained.                           - ASA Grade Assessment: II - A patient with mild                            systemic  disease.                           After obtaining informed consent, the colonoscope                            was passed under direct vision. Throughout the                            procedure, the patient's blood pressure, pulse, and                            oxygen  saturations were monitored continuously. The                            [id number]-[id number] ([id number]) scope was introduced through                             the anus and advanced to the the cecum, identified                            by appendiceal orifice and ileocecal valve. The                            colonoscopy was performed without difficulty. The                            patient tolerated the procedure well. The quality                            of the bowel preparation was good. Scope In: 9:39:56 AM Scope Out: 10:04:00 AM Scope Withdrawal Time: 0 hours 10 minutes 57 seconds  Total Procedure Duration: 0 hours 24 minutes 4 seconds  Findings:      The perianal and digital rectal examinations were normal.      The colon (entire examined portion) appeared normal.      Non-bleeding internal hemorrhoids were found during retroflexion. The       hemorrhoids were small. Impression:               - The entire examined colon is normal.                           - Non-bleeding internal hemorrhoids.                           - No specimens collected. Moderate Sedation:      Per Anesthesia Care Recommendation:           - Discharge patient to home (ambulatory).                           - Resume previous diet.                           - Repeat colonoscopy in 5 years for screening  purposes.                           - Proceed with MR defecography.                           - Continue present medications. Procedure Code(s):        --- Professional ---                           [phone], Colonoscopy, flexible; diagnostic, including                            collection of specimen(s) by brushing or washing,                            when performed (separate procedure) Diagnosis Code(s):        --- Professional ---                           K64.8, Other hemorrhoids                           Z80.0, Family history of malignant neoplasm of                            digestive organs                           R10.9, Unspecified abdominal pain                           K59.00, Constipation,  unspecified CPT copyright 2022 American Medical Association. All rights reserved. The codes documented in this report are preliminary and upon coder review may  be revised to meet current compliance requirements. Samantha Cress, MD Samantha Cress,  01/20/2024 10:13:13 AM This report has been signed electronically. Number of Addenda: 0

## 2024-01-20 NOTE — Anesthesia Preprocedure Evaluation (Signed)
 Anesthesia Evaluation  Patient identified by MRN, date of birth, ID band Patient awake    Reviewed: Allergy & Precautions, H&P , NPO status , Patient's Chart, lab work & pertinent test results, reviewed documented beta blocker date and time   Airway Mallampati: II  TM Distance: >3 FB Neck ROM: full    Dental no notable dental hx.    Pulmonary neg pulmonary ROS, Current Smoker and Patient abstained from smoking.   Pulmonary exam normal breath sounds clear to auscultation       Cardiovascular Exercise Tolerance: Good hypertension,  Rhythm:regular Rate:Normal     Neuro/Psych  Headaches PSYCHIATRIC DISORDERS Anxiety Depression    TIA Neuromuscular disease    GI/Hepatic Neg liver ROS, hiatal hernia,GERD  ,,  Endo/Other  diabetes    Renal/GU Renal disease  negative genitourinary   Musculoskeletal   Abdominal   Peds  Hematology negative hematology ROS (+)   Anesthesia Other Findings   Reproductive/Obstetrics negative OB ROS                             Anesthesia Physical Anesthesia Plan  ASA: 3  Anesthesia Plan: General   Post-op Pain Management:    Induction:   PONV Risk Score and Plan: Propofol  infusion  Airway Management Planned:   Additional Equipment:   Intra-op Plan:   Post-operative Plan:   Informed Consent: I have reviewed the patients History and Physical, chart, labs and discussed the procedure including the risks, benefits and alternatives for the proposed anesthesia with the patient or authorized representative who has indicated his/her understanding and acceptance.     Dental Advisory Given  Plan Discussed with: CRNA  Anesthesia Plan Comments:        Anesthesia Quick Evaluation

## 2024-01-21 NOTE — Anesthesia Postprocedure Evaluation (Signed)
 Anesthesia Post Note  Patient: Odessia Asleson  Procedure(s) Performed: COLONOSCOPY EGD (ESOPHAGOGASTRODUODENOSCOPY)  Patient location during evaluation: Phase II Anesthesia Type: General Level of consciousness: awake Pain management: pain level controlled Vital Signs Assessment: post-procedure vital signs reviewed and stable Respiratory status: spontaneous breathing and respiratory function stable Cardiovascular status: blood pressure returned to baseline and stable Postop Assessment: no headache and no apparent nausea or vomiting Anesthetic complications: no Comments: Late entry   No notable events documented.   Last Vitals:  Vitals:   01/20/24 1008 01/20/24 1012  BP: (!) 87/47 (!) 92/56  Pulse: 67 65  Resp: 20 (!) 22  Temp: 36.4 C   SpO2: 97% 97%    Last Pain:  Vitals:   01/20/24 1008  TempSrc: Axillary  PainSc: 0-No pain                 Yvonna JINNY Bosworth

## 2024-01-23 ENCOUNTER — Ambulatory Visit (INDEPENDENT_AMBULATORY_CARE_PROVIDER_SITE_OTHER): Payer: Self-pay | Admitting: Gastroenterology

## 2024-01-23 ENCOUNTER — Encounter (INDEPENDENT_AMBULATORY_CARE_PROVIDER_SITE_OTHER): Admitting: Gastroenterology

## 2024-01-23 LAB — SURGICAL PATHOLOGY

## 2024-01-25 ENCOUNTER — Encounter (HOSPITAL_COMMUNITY): Payer: Self-pay | Admitting: Gastroenterology

## 2024-01-25 DIAGNOSIS — R5383 Other fatigue: Secondary | ICD-10-CM | POA: Diagnosis not present

## 2024-01-25 DIAGNOSIS — Z6821 Body mass index (BMI) 21.0-21.9, adult: Secondary | ICD-10-CM | POA: Diagnosis not present

## 2024-01-25 DIAGNOSIS — R7989 Other specified abnormal findings of blood chemistry: Secondary | ICD-10-CM | POA: Diagnosis not present

## 2024-01-25 DIAGNOSIS — E1043 Type 1 diabetes mellitus with diabetic autonomic (poly)neuropathy: Secondary | ICD-10-CM | POA: Diagnosis not present

## 2024-01-25 DIAGNOSIS — K5909 Other constipation: Secondary | ICD-10-CM | POA: Diagnosis not present

## 2024-01-27 ENCOUNTER — Encounter (INDEPENDENT_AMBULATORY_CARE_PROVIDER_SITE_OTHER): Payer: Self-pay | Admitting: *Deleted

## 2024-01-27 ENCOUNTER — Telehealth (INDEPENDENT_AMBULATORY_CARE_PROVIDER_SITE_OTHER): Payer: Self-pay | Admitting: *Deleted

## 2024-01-27 NOTE — Telephone Encounter (Signed)
 Per [facility] op note Proceed with MR defecography.

## 2024-01-27 NOTE — Telephone Encounter (Signed)
 Dr. Castaneda, is that something we send to [facility] for? I have not scheduled this before.

## 2024-01-27 NOTE — Telephone Encounter (Signed)
 Hi,  Yes, this is at [facility]. Dx: constipation, rule out pelvic floor dysfunction. I thought this had been ordered months ago.

## 2024-01-27 NOTE — Telephone Encounter (Signed)
 Per Mitzie last OV note -consider MR defecography after colonoscopy if no acute findings  Will send to [facility] to send referral

## 2024-01-30 ENCOUNTER — Other Ambulatory Visit: Payer: Self-pay | Admitting: *Deleted

## 2024-01-30 DIAGNOSIS — K5900 Constipation, unspecified: Secondary | ICD-10-CM

## 2024-01-30 NOTE — Telephone Encounter (Signed)
 Order has been sent, they will contact patient with apt

## 2024-02-17 ENCOUNTER — Ambulatory Visit: Admitting: Cardiology

## 2024-02-21 ENCOUNTER — Other Ambulatory Visit: Payer: Self-pay | Admitting: Internal Medicine

## 2024-02-21 DIAGNOSIS — E1042 Type 1 diabetes mellitus with diabetic polyneuropathy: Secondary | ICD-10-CM

## 2024-02-21 MED ORDER — INSULIN ASPART FLEXPEN 100 UNIT/ML ~~LOC~~ SOPN: PEN_INJECTOR | SUBCUTANEOUS | 1 refills | Status: DC

## 2024-02-21 NOTE — Addendum Note (Signed)
 Addended by: CLEOTILDE ROLIN RAMAN on: 02/21/2024 04:30 PM   Modules accepted: Orders

## 2024-03-06 ENCOUNTER — Telehealth: Payer: Self-pay | Admitting: Cardiology

## 2024-03-06 NOTE — Telephone Encounter (Signed)
 Multiple attempts made to contact patient with no response, recall for 6 months deleted

## 2024-03-14 DIAGNOSIS — W19XXXA Unspecified fall, initial encounter: Secondary | ICD-10-CM | POA: Diagnosis not present

## 2024-03-14 DIAGNOSIS — M7918 Myalgia, other site: Secondary | ICD-10-CM | POA: Diagnosis not present

## 2024-03-14 DIAGNOSIS — Z6821 Body mass index (BMI) 21.0-21.9, adult: Secondary | ICD-10-CM | POA: Diagnosis not present

## 2024-03-27 ENCOUNTER — Ambulatory Visit (INDEPENDENT_AMBULATORY_CARE_PROVIDER_SITE_OTHER): Admitting: Gastroenterology

## 2024-04-04 DIAGNOSIS — K5902 Outlet dysfunction constipation: Secondary | ICD-10-CM | POA: Diagnosis not present

## 2024-04-04 DIAGNOSIS — K5900 Constipation, unspecified: Secondary | ICD-10-CM | POA: Diagnosis not present

## 2024-04-05 ENCOUNTER — Telehealth (INDEPENDENT_AMBULATORY_CARE_PROVIDER_SITE_OTHER): Payer: Self-pay | Admitting: Gastroenterology

## 2024-04-05 NOTE — Telephone Encounter (Signed)
 Received the results of the most recent MR defecography performed on 04/04/2024 which showed moderate cystocele, mild vaginocele, small rectocele, diminished expected poor Terr[place] impression.  This was concerning for tricompartmental involvement with pelvic floor dysfunction.  Due to this, I discussed the patient the possibility of proceeding with pelvic floor therapy.  She is open to proceed with this.  Hi Ann, can you please refer the patient to pelvic floor therapy at [facility].  Diagnosis pelvic floor dysfunction.  Thanks,   Toribio Fortune, MD Gastroenterology and Hepatology [facility] Gastroenterology

## 2024-04-06 NOTE — Telephone Encounter (Signed)
 Referral sent, they will contact patient with apt

## 2024-04-09 ENCOUNTER — Telehealth (INDEPENDENT_AMBULATORY_CARE_PROVIDER_SITE_OTHER): Payer: Self-pay

## 2024-04-09 ENCOUNTER — Telehealth (INDEPENDENT_AMBULATORY_CARE_PROVIDER_SITE_OTHER): Payer: Self-pay | Admitting: Gastroenterology

## 2024-04-09 NOTE — Telephone Encounter (Signed)
 Pt is aware that we are cancelling her Sept OV and she is asking does she need to follow up 3 months from now or 3 months after she starts her treatment therapy in November. I told her I would ask and call her back to let her know. [phone]

## 2024-04-09 NOTE — Telephone Encounter (Signed)
 She can cancel her appointment and reschedule in 3 months so we can evaluate the clinical progress after seeing pelvic floor therapy Thanks

## 2024-04-09 NOTE — Telephone Encounter (Signed)
 Patient has upcoming appointment here on 04/16/2024. Patient says she spoke with you on 04/05/2024 and would like to know if she needs to keep the upcoming appointment. Please advise.

## 2024-04-10 NOTE — Telephone Encounter (Signed)
 Thanks

## 2024-04-10 NOTE — Telephone Encounter (Signed)
 I spoke with the patient and made her aware, The results varies by individual,but you can see results in a few weeks,but may require several months to see a difference it will depend on you following the treatment plan. Patient states understanding.

## 2024-04-16 ENCOUNTER — Ambulatory Visit (INDEPENDENT_AMBULATORY_CARE_PROVIDER_SITE_OTHER): Admitting: Gastroenterology

## 2024-05-01 ENCOUNTER — Telehealth (INDEPENDENT_AMBULATORY_CARE_PROVIDER_SITE_OTHER): Payer: Self-pay

## 2024-05-01 NOTE — Telephone Encounter (Signed)
 Dx:

## 2024-05-01 NOTE — Telephone Encounter (Signed)
 I found Dx - Referral sent, they will contact patient with apt

## 2024-05-01 NOTE — Telephone Encounter (Signed)
 Thanks.  Ann, can you please refer the patient to UroGYN at [facility]? Thanks  Thanks,   Toribio Fortune, MD Gastroenterology and Hepatology [facility] Gastroenterology

## 2024-05-01 NOTE — Telephone Encounter (Signed)
 Patient called today stating she would like to be referred to a surgeon to discuss what options she has, and would like to bypass the pelvic floor therapy. She says the therapy does not start until 06/04/2024 and she does not think she can tolerate the constant back and abdominal pain until then. Please advise.

## 2024-05-01 NOTE — Telephone Encounter (Signed)
 I spoke with the patient and made her aware we have referred her to UROGYN in [place] and they will be reaching out to her. Patient states understanding.

## 2024-05-03 DIAGNOSIS — R519 Headache, unspecified: Secondary | ICD-10-CM | POA: Diagnosis not present

## 2024-05-03 DIAGNOSIS — J019 Acute sinusitis, unspecified: Secondary | ICD-10-CM | POA: Diagnosis not present

## 2024-05-03 DIAGNOSIS — Z6821 Body mass index (BMI) 21.0-21.9, adult: Secondary | ICD-10-CM | POA: Diagnosis not present

## 2024-05-16 ENCOUNTER — Encounter (INDEPENDENT_AMBULATORY_CARE_PROVIDER_SITE_OTHER): Payer: Self-pay | Admitting: Gastroenterology

## 2024-05-22 ENCOUNTER — Telehealth: Payer: Self-pay | Admitting: Dietician

## 2024-05-22 NOTE — Telephone Encounter (Signed)
 Patient now has her iLet pump. Called patient for a training appointment.  Scheduled for 05/31/2024 at 8:30 am. Instructed patient to bring her Novolog  pens as she uses these now to fill her Omnipod pods and wishes to continue with the pens. Also asked her to bring an extra CGM.  Leita Constable, RD, LDN, CDCES, DipACLM

## 2024-05-24 ENCOUNTER — Ambulatory Visit: Admitting: Internal Medicine

## 2024-05-31 ENCOUNTER — Telehealth: Payer: Self-pay | Admitting: Dietician

## 2024-05-31 ENCOUNTER — Encounter: Admitting: Dietician

## 2024-05-31 NOTE — Telephone Encounter (Signed)
 Called patient to reschedule iLet pump training.  Today's appointment cancelled do to illness of [employer] pump trainer.  Patient not available.  Message left for patient to call to reschedule.  Leita Constable, RD, LDN, CDCES, DipACLM

## 2024-05-31 NOTE — Telephone Encounter (Signed)
 Appointment rescheduled for iLet pump training for 06/07/2024.  Leita Constable, RD, LDN, CDCES, DipACLM

## 2024-06-04 ENCOUNTER — Ambulatory Visit: Attending: Physical Therapy

## 2024-06-04 NOTE — Therapy (Deleted)
 OUTPATIENT PHYSICAL THERAPY FEMALE PELVIC EVALUATION   Patient Name: [name] MRN: [id number] DOB:July 23, 1971, 53 y.o., female Today's Date: 06/04/2024  END OF SESSION:   Past Medical History:  Diagnosis Date   Benign paroxysmal vertigo    Cervicalgia    Depression    Diabetes mellitus (HCC)    GERD (gastroesophageal reflux disease)    Hiatal hernia    History of cardiac catheterization    Normal coronary arteries 2016 and 2021   History of TIA (transient ischemic attack)    Hyperlipidemia    Hypertension    IBS (irritable bowel syndrome)    Migraine headache    Panic disorder with agoraphobia    Peripheral neuropathy    Subungual hematoma of digit of hand    Tendonitis    Vertigo    Past Surgical History:  Procedure Laterality Date   ABDOMINAL HYSTERECTOMY     APPENDECTOMY     BREAST BIOPSY Right 12/01/2022   MM RT BREAST BX W LOC DEV 1ST LESION IMAGE BX SPEC STEREO GUIDE 12/01/2022 [facility]   CARDIAC CATHETERIZATION   last in 2009   x 4, normal coronary arteries   CARDIAC CATHETERIZATION N/A 03/26/2015   Procedure: Left Heart Cath and Coronary Angiography;  Surgeon: Deatrice DELENA Cage, MD;  [facility]: [facility];  Service: Cardiovascular;  Laterality: N/A;   CARPAL TUNNEL RELEASE Right 08/24/2018   Procedure: CARPAL TUNNEL RELEASE;  Surgeon: Margrette Taft BRAVO, MD;  Location: [facility] ORS;  Service: Orthopedics;  Laterality: Right;   CESAREAN SECTION     CHOLECYSTECTOMY     COLONOSCOPY N/A 01/20/2024   Procedure: COLONOSCOPY;  Surgeon: [name], MD;  Location: [facility];  Service: Gastroenterology;  Laterality: N/A;  930am, asa 2   COLONOSCOPY WITH PROPOFOL  N/A 01/09/2018   Procedure: COLONOSCOPY WITH PROPOFOL ;  Surgeon: Golda Claudis PENNER, MD;  Location: [facility];  Service: Endoscopy;  Laterality: N/A;   COLONOSCOPY WITH PROPOFOL  N/A 09/21/2019   Procedure: COLONOSCOPY WITH PROPOFOL ;  Surgeon: Golda Claudis PENNER, MD;  Location:  [facility];  Service: Endoscopy;  Laterality: N/A;  1055   CYST EXCISION     right breast   ESOPHAGOGASTRODUODENOSCOPY N/A 01/20/2024   Procedure: EGD (ESOPHAGOGASTRODUODENOSCOPY);  Surgeon: Eartha Angelia, Sieving, MD;  Location: [facility];  Service: Gastroenterology;  Laterality: N/A;   ESOPHAGOGASTRODUODENOSCOPY (EGD) WITH PROPOFOL  N/A 01/09/2018   Procedure: ESOPHAGOGASTRODUODENOSCOPY (EGD) WITH PROPOFOL ;  Surgeon: Golda Claudis PENNER, MD;  Location: [facility];  Service: Endoscopy;  Laterality: N/A;   LEFT HEART CATH AND CORONARY ANGIOGRAPHY N/A 02/25/2020   Procedure: LEFT HEART CATH AND CORONARY ANGIOGRAPHY;  Surgeon: Burnard Debby DELENA, MD;  Location: [facility];  Service: Cardiovascular;  Laterality: N/A;   POLYPECTOMY  09/21/2019   Procedure: POLYPECTOMY;  Surgeon: Golda Claudis PENNER, MD;  Location: [facility];  Service: Endoscopy;;  ascending colon;   TOE SURGERY Right    3 rd and 4 th toes   TOOTH EXTRACTION Bilateral 02/16/2018   Procedure: 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 RIGHT MAXILLARY SINUS ANTROSTOMY;  Surgeon: Sheryle Hamilton, DDS;  Location: [facility] OR;  Service: Oral Surgery;  Laterality: Bilateral;   TUBAL LIGATION     Patient Active Problem List   Diagnosis Date Noted   Abdominal pain 01/20/2024   Loss of weight 01/09/2024   Melena 01/09/2024   Nausea without vomiting 01/09/2024   IBS (irritable bowel syndrome) 10/08/2021   DKA (diabetic ketoacidosis) (  HCC) 09/29/2018   Dehydration 09/29/2018   AKI (acute kidney injury) 09/29/2018   Hyperkalemia 09/29/2018   Hyponatremia 09/29/2018   S/P carpal tunnel release right 08/24/18 09/14/2018   Carpal tunnel syndrome of right wrist    Depression 01/06/2018   Constipation 01/05/2018   Family hx of colon cancer 11/10/2017   Chest pain 03/25/2015   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   Migraine 03/25/2015   Hypertension    Hyperlipidemia    Anxiety    Pain in the chest     Essential hypertension    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  Rationale for Evaluation and Treatment: Rehabilitation  ONSET DATE: ***  SUBJECTIVE:                                                                                                                                                                                           SUBJECTIVE STATEMENT: ***   PAIN:  Are you having pain? {yes/no:20286} NPRS scale: ***/10 Pain location: {pelvic pain location:27098}  Pain type: {type:313116} Pain description: {PAIN DESCRIPTION:21022940}   Aggravating factors: *** Relieving factors: ***  PRECAUTIONS: {Therapy precautions:24002}  RED FLAGS: {PT Red Flags:29287}   WEIGHT BEARING RESTRICTIONS: {Yes ***/No:24003}  FALLS:  Has patient fallen in last 6 months? {fallsyesno:27318}  OCCUPATION: ***  ACTIVITY LEVEL : ***  PLOF: {PLOF:24004}  PATIENT GOALS: ***  PERTINENT HISTORY:  Abdominal hysterectomy, appendectomy, cholecystectomy, c-section, tubal ligation, type 1 diabetes, anxiety, depression, peripheral neuropathy, constipation, IBS Sexual abuse: {Yes/No:304960894}  BOWEL MOVEMENT: Pain with bowel movement: {yes/no:20286} Type of bowel movement:{PT BM type:27100} Fully empty rectum: {No/Yes:304960894} Leakage: {Yes/No:304960894} Urgency: {Yes/No:304960894} Pads: {Yes/No:304960894} Fiber supplement/laxative {YES/NO AS:20300}  URINATION: Pain with urination: {yes/no:20286} Fully empty bladder: {Yes/No:304960894} Stream: {PT urination:27102} Urgency: {YES/NO  AS:20300} Frequency: *** Nocturia: *** Fluid Intake: *** Leakage: {PT leakage:27103} Pads: {Yes/No:304960894}  INTERCOURSE:  Ability to have vaginal penetration {YES/NO:21197} Pain with intercourse: {pain with intercourse PA:27099} Dryness{YES/NO AS:20300} Climax: *** Marinoff Scale: ***/3 Lubricant: ***  PREGNANCY: Vaginal deliveries *** Tearing {Yes***/No:304960894} Episiotomy {YES/NO AS:20300} C-section deliveries *** Currently pregnant {Yes***/No:304960894}  PROLAPSE: {PT prolapse:27101}   OBJECTIVE:  Note: Objective measures were completed at Evaluation unless otherwise noted.  06/04/24 PATIENT SURVEYS:   PFIQ-7: ***  COGNITION: Overall cognitive status: Within functional limits  for tasks assessed     SENSATION: Light touch: Appears intact   FUNCTIONAL TESTS:  Squat: *** Single leg stance:  Rt: ***  Lt: *** Curl-up test: *** Sit-up test: *** Active straight leg raise: ***   GAIT: Assistive device utilized: None Comments: ***  POSTURE: {posture:25561}   LUMBARAROM/PROM:  A/PROM A/PROM  Eval (% available)  Flexion   Extension   Right lateral flexion   Left lateral flexion   Right rotation   Left rotation    (Blank rows = not tested)  PALPATION: General: ***  Abdominal: Sternocostal angle: *** Breathing: *** Tenderness: *** Scar tissue: *** Diastasis: ***                External Perineal Exam: ***                             Internal Pelvic Floor: ***  Patient confirms identification and approves PT to assess internal pelvic floor and treatment {yes/no:20286}  PELVIC MMT:   MMT eval  Vaginal   Internal Anal Sphincter   External Anal Sphincter   Puborectalis   (Blank rows = not tested)        TONE: ***  PROLAPSE: ***  TODAY'S TREATMENT:                                                                                                                              DATE:  *** EVAL  Manual:  Neuromuscular  re-education:  Exercises:  Therapeutic activities:     PATIENT EDUCATION:  Education details: See above Person educated: Patient Education method: Explanation, Demonstration, Tactile cues, Verbal cues, and Handouts Education comprehension: verbalized understanding  HOME EXERCISE PROGRAM: ***  ASSESSMENT:  CLINICAL IMPRESSION: Patient is a 53 y.o. female who was seen today for physical therapy evaluation and treatment for ***.   OBJECTIVE IMPAIRMENTS: decreased activity tolerance, decreased coordination, decreased endurance, decreased mobility, decreased ROM, decreased strength, increased fascial restrictions, increased muscle spasms, impaired flexibility, impaired tone, improper body mechanics, postural dysfunction, and pain.   ACTIVITY LIMITATIONS: {activitylimitations:27494}  PARTICIPATION LIMITATIONS: {participationrestrictions:25113}  PERSONAL FACTORS: {Personal factors:25162} are also affecting patient's functional outcome.   REHAB POTENTIAL: {rehabpotential:25112}  CLINICAL DECISION MAKING: {clinical decision making:25114}  EVALUATION COMPLEXITY: {Evaluation complexity:25115}   GOALS: Goals reviewed with patient? {yes/no:20286}  SHORT TERM GOALS: Target date: ***  Pt will be independent with HEP in order to improve activity tolerance.   Baseline: Goal status: INITIAL  2.  ***  Baseline:  Goal status: INITIAL  3.  ***  Baseline:  Goal status: INITIAL  4.  *** Baseline:  Goal status: INITIAL  5.  *** Baseline:  Goal status: INITIAL  6.  *** Baseline:  Goal status: INITIAL  LONG TERM GOALS: Target date: ***  Pt will be independent with advanced HEP in order to improve activity tolerance.   Baseline:  Goal status: INITIAL  2.  ***  Baseline:  Goal status: INITIAL  3.  *** Baseline:  Goal status: INITIAL  4.  *** Baseline:  Goal status: INITIAL  5.  *** Baseline:  Goal status: INITIAL  6.  *** Baseline:  Goal status:  INITIAL  PLAN:  PT FREQUENCY: 1-2x/week  PT DURATION: ***   PLANNED INTERVENTIONS: 02835- PT Re-evaluation, 97110-Therapeutic exercises, 97530- Therapeutic activity, 97112- Neuromuscular re-education, 97535- Self Care, 02859- Manual therapy, [id number]- Gait training, [phone]- Aquatic Therapy, [phone]- Electrical stimulation (unattended), [id number]- Traction (mechanical), [id number]- Ionotophoresis 4mg /ml Dexamethasone, 79439 (1-2 muscles), 20561 (3+ muscles)- Dry Needling, Patient/Family education, Balance training, Taping, Joint mobilization, Joint manipulation, Spinal manipulation, Spinal mobilization, Scar mobilization, Vestibular training, Cryotherapy, Moist heat, and Biofeedback  PLAN FOR NEXT SESSION: ***  Josette Mares, PT, DPT11/03/251:46 PM [facility] [address], [address] [place], [place] [address] Phone # [phone] Fax [phone]

## 2024-06-07 ENCOUNTER — Encounter: Attending: Internal Medicine | Admitting: Dietician

## 2024-06-07 ENCOUNTER — Ambulatory Visit (INDEPENDENT_AMBULATORY_CARE_PROVIDER_SITE_OTHER): Admitting: Internal Medicine

## 2024-06-07 ENCOUNTER — Other Ambulatory Visit

## 2024-06-07 ENCOUNTER — Encounter: Payer: Self-pay | Admitting: Internal Medicine

## 2024-06-07 VITALS — BP 122/64 | HR 89 | Ht 67.0 in | Wt 137.8 lb

## 2024-06-07 DIAGNOSIS — E1042 Type 1 diabetes mellitus with diabetic polyneuropathy: Secondary | ICD-10-CM

## 2024-06-07 DIAGNOSIS — R7989 Other specified abnormal findings of blood chemistry: Secondary | ICD-10-CM

## 2024-06-07 DIAGNOSIS — E782 Mixed hyperlipidemia: Secondary | ICD-10-CM | POA: Diagnosis not present

## 2024-06-07 DIAGNOSIS — E1065 Type 1 diabetes mellitus with hyperglycemia: Secondary | ICD-10-CM | POA: Insufficient documentation

## 2024-06-07 LAB — POCT GLYCOSYLATED HEMOGLOBIN (HGB A1C): Hemoglobin A1C: 10 % — AB (ref 4.0–5.6)

## 2024-06-07 MED ORDER — GLUCAGON 3 MG/DOSE NA POWD: 3.0000 mg | Freq: Once | NASAL | 11 refills | Status: AC | PRN

## 2024-06-07 MED ORDER — INSULIN ASPART FLEXPEN 100 UNIT/ML ~~LOC~~ SOPN: PEN_INJECTOR | SUBCUTANEOUS | 3 refills | Status: DC

## 2024-06-07 NOTE — Progress Notes (Signed)
 Patient ID: [name], female   DOB: Jan 19, 1971, 53 y.o.   MRN: [id number]  HPI: [name] is a 53 y.o.-year-old female, returning for f/u for DM1, dx in 49 (53 y/o), uncontrolled, with complications (cerebro-vascular ds - h/o TIA 0/2016, PN, DR). Last visit 5 months ago (virtual). She is here with her husband. Insurance: [employer].  Interim history: No increased urination, blurry vision, chest pain.     She continues to have numbness and tingling in her feet.   Insulin  pump: -Previously OmniPod -Then Medtronic 670 G insulin  pump but could not afford the Enlite CGM -then T: slim X2-started 10/2019 -now Omnipod5 - started 06/30/2021 - likes this better (changed 10/2022).  She is using NovoLog  FlexPen the reservoir.  She prefers this.  CGM: -Dexcom G6 >> G7  Insulin : -Sugars improved after her insurance switched coverage from Humalog  to NovoLog   Supplies:  -[employer]  Reviewed HbA1c levels: Lab Results  Component Value Date   HGBA1C 9.0 (A) 08/23/2023   HGBA1C 8.7 03/29/2023   HGBA1C 9.0 (A) 11/25/2022   Previously on: - Lantus  25 units in am and 10 units at night - Novolog  ICR 1:17, target 150, ISF 1:25  On insulin  pump: Pump settings:  - basal rates: 12 am: 0.9 3 am: 0.9 >> 0.75  11 am: 0.9 - ICR:              12 am: 1:11 >> 1:9 >> she actually ended 1.9 g/unit in the pump!!! - target: 110-150  - ISF: 50 - Active insulin  time: 4 hours - target:  110-150 >> 120-130 - ISF: 50 - Active insulin  time: 4 hours TDD from basal insulin : 17.1 units (75%) >> 77% >> 73% (21.4 units) daily >> 82% (18.3 units) TDD from bolus insulin : 5.7 units (25%) >> 23% >> 27% (8.1 units) >> 18% (3.9 units) Total daily dose: 25-50 >> 23-50 units daily   She was previously also on: - Metformin  500 mg 2x a day - started 09/2021 -stopped after she started to be more fatigued, but she realized that this was not really related to metformin . She stopped Metformin  10/2022 2/2  weight loss.  She checks her sugars more than 4 times a day with her CGM:  Previously:  Previously:  Lowest sugar was 27 in 07/2014>> .SABRA.40s >> LO. No hypoglycemia awareness!  She does not have a glucagon  kit at home. Highest sugar was  1200 (!!!) ...  >> 400s >> 500s. No history of hypoglycemia admissions.   She  had hyperglycemia ED visits and an admission in 12/2017. She had a DKA admission in 09/2018 - with suspicion of stroke.  This was ruled out by MRI and she also had normal carotid Dopplers afterwards.   -No CKD, last BUN/creatinine:  Lab Results  Component Value Date   BUN 6 01/16/2024   CREATININE 0.77 01/16/2024   No results found for: MICRALBCREAT On Lisinopril  5 mg daily.  -+ HL; last set of lipids: 07/14/2023: 117/58/66/89 Lab Results  Component Value Date   CHOL 155 11/25/2022   HDL 45.40 11/25/2022   LDLCALC 89 11/25/2022   TRIG 105.0 11/25/2022   CHOLHDL 3 11/25/2022  On pravastatin  20 >> 40.  - last eye exam was 08/08/2023: + DR.  -+ Numbness and tingling in feet and hands. She was previously on Neurontin  but she stopped due to side effects. Currently off Lyrica .  Last foot exam 03/2023.    Reviewed B12 levels: Lab Results  Component Value  Date   VITAMINB12 1,291 (H) 11/25/2022   CPUJFPWA87 >1504 (H) 10/06/2021   VITAMINB12 650 10/17/2020   VITAMINB12 >1526 (H) 04/04/2020   VITAMINB12 >1500 (H) 12/26/2018   VITAMINB12 481 12/06/2014  She stopped B12 in 2020.  Latest TSH was elevated: 01/25/2024: TSH 3.650, fT4 0.92, TPO Ab 64 (<34) Lab Results  Component Value Date   TSH 9.10 (H) 01/10/2024  07/15/2023: TSH 4.0  She had bilateral big toe surgery in 09/2016 with Dr. Janit.  She is not seeing them anymore.  ROS: + See HPI  I reviewed pt's medications, allergies, PMH, social hx, family hx, and changes were documented in the history of present illness. Otherwise, unchanged from my initial visit note.  Past Medical History:  Diagnosis Date    Benign paroxysmal vertigo    Cervicalgia    Depression    Diabetes mellitus (HCC)    GERD (gastroesophageal reflux disease)    Hiatal hernia    History of cardiac catheterization    Normal coronary arteries 2016 and 2021   History of TIA (transient ischemic attack)    Hyperlipidemia    Hypertension    IBS (irritable bowel syndrome)    Migraine headache    Panic disorder with agoraphobia    Peripheral neuropathy    Subungual hematoma of digit of hand    Tendonitis    Vertigo    Past Surgical History:  Procedure Laterality Date   ABDOMINAL HYSTERECTOMY     APPENDECTOMY     BREAST BIOPSY Right 12/01/2022   MM RT BREAST BX W LOC DEV 1ST LESION IMAGE BX SPEC STEREO GUIDE 12/01/2022 [facility]   CARDIAC CATHETERIZATION   last in 2009   x 4, normal coronary arteries   CARDIAC CATHETERIZATION N/A 03/26/2015   Procedure: Left Heart Cath and Coronary Angiography;  Surgeon: Deatrice DELENA Cage, MD;  Location: [facility];  Service: Cardiovascular;  Laterality: N/A;   CARPAL TUNNEL RELEASE Right 08/24/2018   Procedure: CARPAL TUNNEL RELEASE;  Surgeon: Margrette Taft BRAVO, MD;  Location: [facility];  Service: Orthopedics;  Laterality: Right;   CESAREAN SECTION     CHOLECYSTECTOMY     COLONOSCOPY N/A 01/20/2024   Procedure: COLONOSCOPY;  Surgeon: [name], MD;  Location: [facility];  Service: Gastroenterology;  Laterality: N/A;  930am, asa 2   COLONOSCOPY WITH PROPOFOL  N/A 01/09/2018   Procedure: COLONOSCOPY WITH PROPOFOL ;  Surgeon: Golda Claudis PENNER, MD;  Location: [facility];  Service: Endoscopy;  Laterality: N/A;   COLONOSCOPY WITH PROPOFOL  N/A 09/21/2019   Procedure: COLONOSCOPY WITH PROPOFOL ;  Surgeon: Golda Claudis PENNER, MD;  Location: [facility];  Service: Endoscopy;  Laterality: N/A;  1055   CYST EXCISION     right breast   ESOPHAGOGASTRODUODENOSCOPY N/A 01/20/2024   Procedure: EGD (ESOPHAGOGASTRODUODENOSCOPY);  Surgeon: Eartha Angelia, Sieving, MD;   Location: [facility];  Service: Gastroenterology;  Laterality: N/A;   ESOPHAGOGASTRODUODENOSCOPY (EGD) WITH PROPOFOL  N/A 01/09/2018   Procedure: ESOPHAGOGASTRODUODENOSCOPY (EGD) WITH PROPOFOL ;  Surgeon: Golda Claudis PENNER, MD;  Location: [facility];  Service: Endoscopy;  Laterality: N/A;   LEFT HEART CATH AND CORONARY ANGIOGRAPHY N/A 02/25/2020   Procedure: LEFT HEART CATH AND CORONARY ANGIOGRAPHY;  Surgeon: Burnard Debby DELENA, MD;  Location: [facility];  Service: Cardiovascular;  Laterality: N/A;   POLYPECTOMY  09/21/2019   Procedure: POLYPECTOMY;  Surgeon: Golda Claudis PENNER, MD;  Location: [facility];  Service: Endoscopy;;  ascending colon;   TOE SURGERY Right  3 rd and 4 th toes   TOOTH EXTRACTION Bilateral 02/16/2018   Procedure: 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 RIGHT MAXILLARY SINUS ANTROSTOMY;  Surgeon: Sheryle Hamilton, DDS;  Location: [facility];  Service: Oral Surgery;  Laterality: Bilateral;   TUBAL LIGATION     History   Social History   Marital Status: Divorced    Spouse Name: N/A   Number of Children: 1   Occupational History   disabled   Social History Main Topics   Smoking status: Current Every Day Smoker -- 0.50 packs/day for 11 years   Smokeless tobacco: Never Used     Comment: patient is aware that she needs to quit smoking   Alcohol Use: No   Drug Use: No   Current Outpatient Medications on File Prior to Visit  Medication Sig Dispense Refill   Calcium  Polycarbophil (FIBERCON PO) Take by mouth 3 (three) times daily.     Continuous Glucose Receiver (DEXCOM G7 RECEIVER) DEVI Use to monitor glucose continuously 1 each 0   Continuous Glucose Sensor (DEXCOM G7 SENSOR) MISC Use to check glucose continuously, change sensor every 10 days 9 each 3   Continuous Glucose Transmitter (DEXCOM G6 TRANSMITTER) MISC Use as directed change every 90 days. 1 each 3   diazepam  (VALIUM ) 5 MG tablet Take 5 mg by mouth 3 (three) times daily as needed for anxiety.       docusate sodium  (COLACE) 100 MG capsule Take 100-200 mg by mouth See admin instructions. Take 100 mg by mouth in the morning and 200 mg in the evening     EPIPEN  2-PAK 0.3 MG/0.3ML SOAJ injection Inject 0.3 mg into the muscle as needed for anaphylaxis.      Glucagon  3 MG/DOSE POWD Place 3 mg into the nose once as needed for up to 1 dose. 1 each 11   ibuprofen  (ADVIL ) 800 MG tablet TAKE 1 TABLET BY MOUTH 3 TIMES DAILY. (Patient taking differently: Take 800 mg by mouth as needed.) 90 tablet 0   Insulin  Aspart FlexPen (NOVOLOG ) 100 UNIT/ML inject up to 60 units in the pump daily. 60 mL 1   Insulin  Disposable Pump (OMNIPOD 5 G7 PODS, GEN 5,) MISC 1 each by Does not apply route every 3 (three) days. 30 each 3   Insulin  Pen Needle 32G X 4 MM MISC Use 1x a day 50 each 3   lisinopril  (PRINIVIL ,ZESTRIL ) 5 MG tablet Take 5 mg by mouth daily.  2   nitroGLYCERIN  (NITROSTAT ) 0.4 MG SL tablet Place 1 tablet (0.4 mg total) under the tongue every 5 (five) minutes x 3 doses as needed for chest pain (if no relief after 3rd dose, proceed to the ED for an evaluation or call 911). 25 tablet 3   promethazine  (PHENERGAN ) 25 MG tablet Take 1 tablet (25 mg total) by mouth every 6 (six) hours as needed for nausea or vomiting. 30 tablet 0   Prucalopride Succinate  2 MG TABS Take 1 tablet (2 mg total) by mouth daily. 30 tablet 1   rosuvastatin (CRESTOR) 40 MG tablet Take 40 mg by mouth daily.     venlafaxine XR (EFFEXOR-XR) 150 MG 24 hr capsule Take 150 mg by mouth daily.     No current facility-administered medications on file prior to visit.   Allergies  Allergen Reactions   Bee Venom Anaphylaxis and Hives   Zocor  [Simvastatin ] Other (See Comments)    Muscle aches and pains   Clindamycin /Lincomycin Rash   Codeine Rash  Penicillins Rash    Did it involve swelling of the face/tongue/throat, SOB, or low BP? No Did it involve sudden or severe rash/hives, skin peeling, or any reaction on the inside of your mouth or  nose? Yes Did you need to seek medical attention at a hospital or doctor's office? Yes When did it last happen?      30 years If all above answers are "NO", may proceed with cephalosporin use.    Sulfa Antibiotics Rash        Family History  Problem Relation Age of Onset   Stroke Mother 9   Heart attack Mother 57   Cancer Sister        colon   Heart failure Maternal Grandmother 4   Cancer Maternal Grandfather        prostate   Heart failure Paternal Grandmother 81   Heart failure Paternal Grandfather 19   PE: BP 122/64   Pulse 89   Ht 5' 7 (1.702 m)   Wt 137 lb 12.8 oz (62.5 kg)   SpO2 95%   BMI 21.58 kg/m   Wt Readings from Last 20 Encounters:  06/07/24 137 lb 12.8 oz (62.5 kg)  01/20/24 140 lb (63.5 kg)  01/16/24 140 lb 3.4 oz (63.6 kg)  01/09/24 140 lb 4.8 oz (63.6 kg)  09/02/23 144 lb (65.3 kg)  08/23/23 143 lb 6.4 oz (65 kg)  03/29/23 148 lb 6.4 oz (67.3 kg)  11/25/22 150 lb (68 kg)  11/02/22 154 lb 9.6 oz (70.1 kg)  08/03/22 165 lb 3.2 oz (74.9 kg)  04/29/22 173 lb 4.8 oz (78.6 kg)  04/02/22 176 lb 6.4 oz (80 kg)  10/08/21 189 lb 8 oz (86 kg)  09/14/21 192 lb (87.1 kg)  07/23/21 186 lb 6.4 oz (84.6 kg)  02/16/21 176 lb 12.8 oz (80.2 kg)  10/17/20 174 lb 3.2 oz (79 kg)  04/04/20 167 lb (75.8 kg)  02/25/20 165 lb (74.8 kg)  02/14/20 166 lb 6.4 oz (75.5 kg)   Constitutional: normal weight, in NAD Eyes:  EOMI, no exophthalmos ENT: no neck masses, no cervical lymphadenopathy Cardiovascular: RRR, No MRG Respiratory: CTA B Musculoskeletal: no deformities Skin:no rashes Neurological: no tremor with outstretched hands Diabetic Foot Exam - Simple   Simple Foot Form Diabetic Foot exam was performed with the following findings: Yes 06/07/2024  9:54 AM  Visual Inspection See comments: Yes Sensation Testing See comments: Yes Pulse Check Posterior Tibialis and Dorsalis pulse intact bilaterally: Yes Comments + hammertoe deformity 2nd R toe + decreased  sensation to monofilament throughout    ASSESSMENT: 1. DM1, uncontrolled, with complications - cerebro-vascular ds - h/o TIA 0/2016 - PN - DR Sees cardiology >> Dr. Nila Griffes - [facility]  2. HL  3.  High B12  4.  Elevated TSH  PLAN:  1. Patient with longstanding, uncontrolled, type 1 diabetes, on the OmniPod insulin  pump integrated with the Dexcom CGM, with still poor control.  Our last visit was virtual 5 months ago.  She was in the emergency room with hyperglycemia 6 days after the visit with glucose at 558.   - At last visit, sugars were improving overnight but then increasing after every meal as she was not entering enough carbs for meals or skipping the carb entry completely.  Even when she was entering carbs, sugars were still higher after meals.  I recommended to strengthen her insulin  to carb ratio but also to switch to the  iLet insulin  pump for which she  did not need to introduce any carbs but just announced the meals.  She did decide to go ahead with starting the pump and will have pump training soon. CGM interpretation: -At today's visit, we reviewed her CGM downloads: It appears that 34% of values are in target range (goal >70%), while 65% are higher than 180 (goal <25%), and 1% are lower than 70 (goal <4%).  The calculated average blood sugar is 209.  The projected HbA1c for the next 3 months (GMI) is 8.9%. -Reviewing the CGM trends, sugars appear to be dropping overnight, and then increasing significantly after breakfast, improving in the afternoon and increasing again drastically after dinner.  I did advise her to reduce her basal rate from 12 AM to 3 AM.  Reviewing her pump download, she is rarely introducing any carbs into the pump and bolusing for meals.  Reviewing her pump settings, however, it appears that she misunderstood the instructions at our last visit and instead of changing her insulin  to carb ratio from 1:11-1:9, as advised, she changed it to 1:1.9, which is  drastically different and conducive to hypoglycemia.  This may be the reason why she is afraid to introduce carbs into the pump.  We will change this today to 1: 7.  We did discuss that, if she continues on this, unless she starts entering carbs and bolusing before every meal, will not be able to manage her diabetes well.  Fortunately, she is in the process of starting the iLET insulin  pump which bypasses the need to enter carbs, but she will still need to document the size of her meals. - I suggested to:  Patient Instructions  Please use the following pump settings: - basal rates: 12 am: 0.9 >> 0.8 3 am: 0.75 11 am: 0.9 - ICR:              12 am: 1:1.9 >> 1:7 - target: 120-130 - ISF: 50 - Active insulin  time: 4 hours   Please do the following approximately 15 minutes before every meal and snack: - Enter carbs (C) - Enter sugars (S) - Start insulin  bolus (I)   Try to start the iLET pump.   Please return in 3 months.   - we checked her HbA1c: 10% (higher) - advised to check sugars at different times of the day - 4x a day, rotating check times - advised for yearly eye exams >> she is UTD - return to clinic in 3-4 months  2. HL - Reviewed latest lipid panel from 07/2023: LDL above her goal of less than 55, otherwise fractions at goal: 117/58/66/89 -She continues on a statin-Crestor 40 mg daily-no side effects - We will recheck a lipid panel today-she is fasting  3.  High B12 -Previously on B12 supplements but she came off afterwards -Latest B12 level was slightly high Lab Results  Component Value Date   VITAMINB12 1,291 (H) 11/25/2022  -we will recheck this today   4.  Elevated TSH - 5 months ago she had an elevated TSH (I was not aware of this).  PCP recheck the TSH along with a free T4 approximately a month later, and the TSH and free T4 levels were normal.  She did have elevated TPO antibodies at that time, giving her a diagnosis of Hashimoto's thyroiditis - At today's  visit, we will recheck TFTs and see if she needs to start levothyroxine  Orders Placed This Encounter  Procedures   TSH   T4, free   T3, free  Vitamin B12   Microalbumin / creatinine urine ratio   Lipid Panel w/reflex Direct LDL   Lela Fendt, MD PhD [facility] Endocrinology

## 2024-06-07 NOTE — Addendum Note (Signed)
 Addended by: CLEOTILDE ROLIN RAMAN on: 06/07/2024 10:33 AM   Modules accepted: Orders

## 2024-06-07 NOTE — Patient Instructions (Addendum)
 Please use the following pump settings: - basal rates: 12 am: 0.9 >> 0.8 3 am: 0.75 11 am: 0.9 - ICR:              12 am: 1:1.9 >> 1:7 - target: 120-130 - ISF: 50 - Active insulin  time: 4 hours   Please do the following approximately 15 minutes before every meal and snack: - Enter carbs (C) - Enter sugars (S) - Start insulin  bolus (I)   Try to start the iLET pump.   Please return in 3 months.

## 2024-06-07 NOTE — Progress Notes (Unsigned)
 Appointment Start time:  1105   End time:  1205  Patient is here today 06/07/2024 with her husband for insulin  pump training on the iLet [employer] insulin  pump.  History includes type 1 diabetes and is currently using the Omnipod 5 insulin  pump.  She states that she does not count carbohydrates well and is looking for simpler options.  Patient is using this pump with the Dexcom G7 CGM.  Patient started a new Dexcom G7 sensor and removed her Omnipod POD.  Evaluated intake to determine her normal intake.  Reviewed patient's knowledge about carbohydrates. Diet history is as follows:  Breakfast:  skips  Snack: none  Lunch:  ***  Snack:  none  Dinner:  ***  Snack:  none  Beverages:  ***  Patient was instructed on the following:  It's important that the iLet learns accurate information about your insulin  needs over the next few days.  Follow these tips for your first week on the iLet to help the pump adapt to your needs so that you will have the best experience possible.  Eat meals with your usual carb amount and announce them as usual. Wait at least 4 hours between meals to help the system learn proper dosing. If you need a snack between meals, choose low or no carb snacks like cheese, meat, or vegetables Sources of carbohydrates Meal announcements Should it be a usual for me, more than usual, or less than usual carbohyrate amount compared to my usual meal size. Announce as soon as you start eating.  If it's been over 30 minutes, and you forgot, don't announce. Avoid late meal announcements Only announce based on the carb amount.  You don't have to worry about your current/desired glucose or the fat, protein, or carb type in the meal. If you have a low blood glucose before a meal, treat the low and wait until glucose is normal to announce. Be consistent with how you choose and announce your meal Using meal announcements in the wrong way can cause severe highs or lows. Treating  lows Treating when you get the glucose falling quickly or urgent low soon alert can help to avoid low glucose and the accompanying symptoms Only treat lows if you get the one of the low glucose alerts listed on the chart provided and listed below in the section titled avoid over treating lows. Avoid over treating lows. Glucose Falling Quickly OR Low Glucose:  Treat with 5-10 grams of fast-acting carbs such as 2oz of regular juice or soda OR 1-2 glucose tablets. Urgent Low Soon:  Treat with 10-15 grams of fast acting carbs such as 3-4 oz regular juice or soda OR 2-3 glucose tablets. Urgent Low Glucose:  Treat with 15 grams of fast-acting carbs such as 4 oz of regular juice or soda OR 3-4 glucose tables. Responding to Highs You only need to respond to a high if you have a failed infusion set or the iLet is not delivering insulin . Meal announcements should not be used to treat highs.  If your glucose is high before a meal, do not adjust your meal to a larger size then what you plan to eat.  The system is already working to resolve the high, so incorrectly announcing puts you at risk for lows and impacts the system's learning. Alerts/Alarms Keep CGM connected with alerts turned on and at high volume. Check the notification bell on the iLet home screen and take action. You must be connected to the CGM to avoid BG-run mode,  which can only be used temporarily on the iLet before the system will stop insulin  delivery Give it time! The iLet takes some time to learn your insulin  needs.  Highs and lows in the first few days are normal while it's learning.  Don't give up!  Stick with the tips provided in the worksheet to get the best results. Support Provided [employer] Customer Support number:  9092331726, Option 1 For help with your CGM sensor, call your sensor's manufacturer.  Patient filled insulin  cartridge with insulin , filled tubing, placed infusion set, and started pump Pump was  linked with her CGM. Body weight entered.  Reminded patient the weight does not need to be changed unless it increases or decreases by >15%.  Patient will receive a reminder about this every 90 days.  [employer] pump trainer will follow up with patient for questions.

## 2024-06-08 ENCOUNTER — Telehealth: Payer: Self-pay | Admitting: Dietician

## 2024-06-08 ENCOUNTER — Ambulatory Visit: Payer: Self-pay | Admitting: Internal Medicine

## 2024-06-08 LAB — LIPID PANEL W/REFLEX DIRECT LDL
Cholesterol: 114 mg/dL (ref <200)
HDL: 65 mg/dL (ref >=50)
LDL Cholesterol (Calc): 36 mg/dL
Non-HDL Cholesterol (Calc): 49 mg/dL (ref <130)
Total CHOL/HDL Ratio: 1.8 (calc) (ref <5.0)
Triglycerides: 51 mg/dL (ref <150)

## 2024-06-08 LAB — TSH: TSH: 4.52 m[IU]/L — ABNORMAL HIGH

## 2024-06-08 LAB — MICROALBUMIN / CREATININE URINE RATIO
Creatinine, Urine: 117 mg/dL (ref 20–275)
Microalb Creat Ratio: 6 mg/g{creat} (ref <30)
Microalb, Ur: 0.7 mg/dL

## 2024-06-08 LAB — T4, FREE: Free T4: 0.9 ng/dL (ref 0.8–1.8)

## 2024-06-08 LAB — T3, FREE: T3, Free: 2.6 pg/mL (ref 2.3–4.2)

## 2024-06-08 LAB — VITAMIN B12: Vitamin B-12: 608 pg/mL (ref 200–1100)

## 2024-06-08 NOTE — Telephone Encounter (Signed)
 Patient called for post iLet training follow up. Dexcom Clarity reviewed. Patient completed training about 1330 yesterday.  Her blood glucose rose to 350 at 1345 and then >400 at 2030.  She states that she has been announcing her meals.  She gave a bolus of meal time insulin  using her pen at about 1500 this am and states that she had nausea at that time. She was concerned about site problems and changed her infusion set at 1130 this am. She also called [employer] to see if she was doing things correctly.  They discussed concerns of site issues. Patient no longer has nausea. Sensor reading now 278. I called and spoke to the iLet trainer who will call patient to discuss.  Leita Constable, RD, LDN, CDCES, DipACLM

## 2024-06-14 ENCOUNTER — Telehealth: Payer: Self-pay | Admitting: Dietician

## 2024-06-14 DIAGNOSIS — E1042 Type 1 diabetes mellitus with diabetic polyneuropathy: Secondary | ICD-10-CM

## 2024-06-14 NOTE — Telephone Encounter (Signed)
 Returned patient call. She needs a refill on her Dexcom G7 sensors as well as she has decided to use Novolog  in a vial as this may be easier to fill her pump cartridge.    She also had questions regarding treatment of hypoglycemia and low alerts using the iLet.  She set her low alert to 75 to avoid excessive alarms as it was currently set at 110.  She is learning more about announcing for meals.  She had questions about changing the insulin  cartridge and infusion set.  Recommend calling the iLet 24/7 number for questions.  Reminded her that the tubing has to be disconnected when she rewinds the pump and fills the tubing.  Leita Constable, RD, LDN, CDCES, DipACLM

## 2024-06-18 MED ORDER — DEXCOM G7 SENSOR MISC: 3 refills | Status: AC

## 2024-06-18 MED ORDER — INSULIN ASPART 100 UNIT/ML IJ SOLN: INTRAMUSCULAR | 3 refills | Status: AC

## 2024-06-18 NOTE — Telephone Encounter (Signed)
 Requested Prescriptions   Signed Prescriptions Disp Refills   Continuous Glucose Sensor (DEXCOM G7 SENSOR) MISC 9 each 3    Sig: Use to check glucose continuously, change sensor every 10 days    Authorizing Provider: [employer] FILE    Ordering User: CLEOTILDE, Elber Galyean S   insulin  aspart (NOVOLOG ) 100 UNIT/ML injection 60 mL 3    Sig: inject up to 60 units in the pump daily.    Authorizing Provider: [employer] FILE    Ordering User: CLEOTILDE ROLIN RAMAN

## 2024-06-22 ENCOUNTER — Ambulatory Visit: Admitting: Internal Medicine

## 2024-07-03 ENCOUNTER — Other Ambulatory Visit: Payer: Self-pay | Admitting: Family Medicine

## 2024-07-03 DIAGNOSIS — Z1231 Encounter for screening mammogram for malignant neoplasm of breast: Secondary | ICD-10-CM

## 2024-07-12 ENCOUNTER — Encounter (HOSPITAL_COMMUNITY): Payer: Self-pay

## 2024-07-12 ENCOUNTER — Emergency Department (HOSPITAL_COMMUNITY): Admission: EM | Admit: 2024-07-12 | Discharge: 2024-07-12 | Disposition: A | Discharge status: Home / Self Care

## 2024-07-12 ENCOUNTER — Other Ambulatory Visit: Payer: Self-pay

## 2024-07-12 ENCOUNTER — Emergency Department (HOSPITAL_COMMUNITY)

## 2024-07-12 DIAGNOSIS — R112 Nausea with vomiting, unspecified: Secondary | ICD-10-CM

## 2024-07-12 DIAGNOSIS — K529 Noninfective gastroenteritis and colitis, unspecified: Secondary | ICD-10-CM

## 2024-07-12 LAB — COMPREHENSIVE METABOLIC PANEL WITH GFR
ALT: 23 U/L (ref 0–44)
AST: 28 U/L (ref 15–41)
Albumin: 3.9 g/dL (ref 3.5–5.0)
Alkaline Phosphatase: 155 U/L — ABNORMAL HIGH (ref 38–126)
Anion gap: 14 (ref 5–15)
BUN: 14 mg/dL (ref 6–20)
CO2: 25 mmol/L (ref 22–32)
Calcium: 9.7 mg/dL (ref 8.9–10.3)
Chloride: 94 mmol/L — ABNORMAL LOW (ref 98–111)
Creatinine, Ser: 0.97 mg/dL (ref 0.44–1.00)
GFR, Estimated: >60 mL/min (ref >60)
Glucose, Bld: 158 mg/dL — ABNORMAL HIGH (ref 70–99)
Potassium: 3.9 mmol/L (ref 3.5–5.1)
Sodium: 133 mmol/L — ABNORMAL LOW (ref 135–145)
Total Bilirubin: 0.3 mg/dL (ref 0.0–1.2)
Total Protein: 6.7 g/dL (ref 6.5–8.1)

## 2024-07-12 LAB — CBC
HCT: 41.9 % (ref 36.0–46.0)
Hemoglobin: 14 g/dL (ref 12.0–15.0)
MCH: 29.9 pg (ref 26.0–34.0)
MCHC: 33.4 g/dL (ref 30.0–36.0)
MCV: 89.3 fL (ref 80.0–100.0)
Platelets: 172 K/uL (ref 150–400)
RBC: 4.69 MIL/uL (ref 3.87–5.11)
RDW: 12.7 % (ref 11.5–15.5)
WBC: 14.7 K/uL — ABNORMAL HIGH (ref 4.0–10.5)
nRBC: 0 % (ref 0.0–0.2)

## 2024-07-12 LAB — LIPASE, BLOOD: Lipase: 17 U/L (ref 11–51)

## 2024-07-12 MED ORDER — HYDROMORPHONE HCL 1 MG/ML IJ SOLN
0.5000 mg | Freq: Once | INTRAMUSCULAR | Status: AC
  Administered 2024-07-12: 0.5 mg via INTRAVENOUS
  Filled 2024-07-12: qty 0.5

## 2024-07-12 MED ORDER — METRONIDAZOLE 500 MG PO TABS
500.0000 mg | ORAL_TABLET | Freq: Once | ORAL | Status: AC
  Administered 2024-07-12: 500 mg via ORAL
  Filled 2024-07-12: qty 1

## 2024-07-12 MED ORDER — CIPROFLOXACIN HCL 250 MG PO TABS
500.0000 mg | ORAL_TABLET | Freq: Once | ORAL | Status: AC
  Administered 2024-07-12: 500 mg via ORAL
  Filled 2024-07-12: qty 2

## 2024-07-12 MED ORDER — ONDANSETRON HCL 4 MG/2ML IJ SOLN
4.0000 mg | Freq: Once | INTRAMUSCULAR | Status: AC
  Administered 2024-07-12: 4 mg via INTRAVENOUS
  Filled 2024-07-12: qty 2

## 2024-07-12 MED ORDER — IOHEXOL 300 MG/ML  SOLN
100.0000 mL | Freq: Once | INTRAMUSCULAR | Status: AC | PRN
  Administered 2024-07-12: 100 mL via INTRAVENOUS

## 2024-07-12 MED ORDER — CIPROFLOXACIN HCL 500 MG PO TABS: 500.0000 mg | ORAL_TABLET | Freq: Two times a day (BID) | ORAL | 0 refills | Status: AC

## 2024-07-12 MED ORDER — METRONIDAZOLE 500 MG PO TABS: 500.0000 mg | ORAL_TABLET | Freq: Two times a day (BID) | ORAL | 0 refills | Status: AC

## 2024-07-12 MED ORDER — LACTATED RINGERS IV BOLUS
1000.0000 mL | Freq: Once | INTRAVENOUS | Status: AC
  Administered 2024-07-12: 1000 mL via INTRAVENOUS

## 2024-07-12 MED ORDER — ONDANSETRON HCL 4 MG PO TABS: 4.0000 mg | ORAL_TABLET | Freq: Three times a day (TID) | ORAL | 0 refills | Status: AC | PRN

## 2024-07-12 MED ORDER — ONDANSETRON 4 MG PO TBDP
4.0000 mg | ORAL_TABLET | Freq: Once | ORAL | Status: AC | PRN
  Administered 2024-07-12: 4 mg via ORAL
  Filled 2024-07-12: qty 1

## 2024-07-12 NOTE — ED Triage Notes (Signed)
 Pt arrived via POV c/o emesis off and on since Monday and reports a fever of 104.31F earlier today. Pt reports taking Tylenol  PTA and TUMS for her symptoms with minimal relief.

## 2024-07-12 NOTE — ED Provider Notes (Signed)
 [place] EMERGENCY DEPARTMENT AT [facility] Provider Note   CSN: [id number] Arrival date & time: 07/12/24  1654     Patient presents with: Fever   [name] is a 53 y.o. female.   53 year old female presents for evaluation of fever nausea and diarrhea for the last few days.  States fevers been up to 101-102 at home.  Last fever was last night.  She is also had some abdominal pain for last few days but it has been getting worse.  Had initially had some diarrhea but that has now resolved and she feels she is constipated but now she is having some nausea and vomiting.  She denies any other symptoms or concerns at this time.  States the pain is located right in the middle of her stomach  does not radiate.   Fever Associated symptoms: diarrhea, nausea and vomiting   Associated symptoms: no chest pain, no chills, no cough, no dysuria, no ear pain, no rash and no sore throat        Prior to Admission medications  Medication Sig Start Date End Date Taking? Authorizing Provider  ciprofloxacin (CIPRO) 500 MG tablet Take 1 tablet (500 mg total) by mouth every 12 (twelve) hours for 7 days. 07/12/24 07/19/24 Yes Nakhi Choi L, DO  metroNIDAZOLE  (FLAGYL ) 500 MG tablet Take 1 tablet (500 mg total) by mouth 2 (two) times daily for 7 days. 07/12/24 07/19/24 Yes Kevyn Boquet L, DO  ondansetron  (ZOFRAN ) 4 MG tablet Take 1 tablet (4 mg total) by mouth every 8 (eight) hours as needed for up to 4 days. 07/12/24 07/16/24 Yes Nyala Kirchner L, DO  Calcium  Polycarbophil (FIBERCON PO) Take by mouth 3 (three) times daily.    [provider]  Continuous Glucose Receiver (DEXCOM G7 RECEIVER) DEVI Use to monitor glucose continuously 10/05/23   Trixie File, MD  Continuous Glucose Sensor (DEXCOM G7 SENSOR) MISC Use to check glucose continuously, change sensor every 10 days 06/18/24   Trixie File, MD  Continuous Glucose Transmitter (DEXCOM G6 TRANSMITTER) MISC Use as  directed change every 90 days. 09/20/23   Trixie File, MD  diazepam  (VALIUM ) 5 MG tablet Take 5 mg by mouth 3 (three) times daily as needed for anxiety.     [provider]  docusate sodium  (COLACE) 100 MG capsule Take 100-200 mg by mouth See admin instructions. Take 100 mg by mouth in the morning and 200 mg in the evening    [provider]  EPIPEN  2-PAK 0.3 MG/0.3ML SOAJ injection Inject 0.3 mg into the muscle as needed for anaphylaxis.  02/13/16   [provider]  Glucagon  3 MG/DOSE POWD Place 3 mg into the nose once as needed for up to 1 dose. 06/07/24   Trixie File, MD  ibuprofen  (ADVIL ) 800 MG tablet TAKE 1 TABLET BY MOUTH 3 TIMES DAILY. Patient taking differently: Take 800 mg by mouth as needed. 08/13/22   Janit Thresa HERO, DPM  insulin  aspart (NOVOLOG ) 100 UNIT/ML injection inject up to 60 units in the pump daily. 06/18/24   Trixie File, MD  Insulin  Aspart FlexPen (NOVOLOG ) 100 UNIT/ML inject up to 60 units in the pump daily. 06/07/24   Trixie File, MD  Insulin  Disposable Pump (OMNIPOD 5 G7 PODS, GEN 5,) MISC 1 each by Does not apply route every 3 (three) days. 01/10/24   Trixie File, MD  Insulin  Pen Needle 32G X 4 MM MISC Use 1x a day 08/24/23   Trixie File, MD  lisinopril  (  PRINIVIL ,ZESTRIL ) 5 MG tablet Take 5 mg by mouth daily. 06/10/15   [provider]  nitroGLYCERIN  (NITROSTAT ) 0.4 MG SL tablet Place 1 tablet (0.4 mg total) under the tongue every 5 (five) minutes x 3 doses as needed for chest pain (if no relief after 3rd dose, proceed to the ED for an evaluation or call 911). 11/02/22 06/07/24  Miriam Norris, NP  promethazine  (PHENERGAN ) 25 MG tablet Take 1 tablet (25 mg total) by mouth every 6 (six) hours as needed for nausea or vomiting. 05/24/22   Janit Thresa HERO, DPM  rosuvastatin (CRESTOR) 40 MG tablet Take 40 mg by mouth daily.    [provider]  venlafaxine XR (EFFEXOR-XR) 150 MG 24 hr capsule Take 150 mg by  mouth daily. 01/09/24   [provider]    Allergies: Bee venom, Zocor  [simvastatin ], Clindamycin /lincomycin, Codeine, Penicillins, and Sulfa antibiotics    Review of Systems  Constitutional:  Positive for fever. Negative for chills.  HENT:  Negative for ear pain and sore throat.   Eyes:  Negative for pain and visual disturbance.  Respiratory:  Negative for cough and shortness of breath.   Cardiovascular:  Negative for chest pain and palpitations.  Gastrointestinal:  Positive for abdominal pain, diarrhea, nausea and vomiting.  Genitourinary:  Negative for dysuria and hematuria.  Musculoskeletal:  Negative for arthralgias and back pain.  Skin:  Negative for color change and rash.  Neurological:  Negative for seizures and syncope.  All other systems reviewed and are negative.   Updated Vital Signs BP 112/77 (BP Location: Left Arm)   Pulse 91   Temp 98.9 F (37.2 C) (Oral)   Resp 18   Ht 5' 7 (1.702 m)   Wt 62.5 kg   SpO2 95%   BMI 21.58 kg/m   Physical Exam Vitals and nursing note reviewed.  Constitutional:      General: She is not in acute distress.    Appearance: Normal appearance. She is well-developed. She is not ill-appearing.  HENT:     Head: Normocephalic and atraumatic.  Eyes:     Conjunctiva/sclera: Conjunctivae normal.  Cardiovascular:     Rate and Rhythm: Normal rate and regular rhythm.     Heart sounds: No murmur heard. Pulmonary:     Effort: Pulmonary effort is normal. No respiratory distress.     Breath sounds: Normal breath sounds.  Abdominal:     Palpations: Abdomen is soft.     Tenderness: There is abdominal tenderness.  Musculoskeletal:        General: No swelling.     Cervical back: Neck supple.  Skin:    General: Skin is warm and dry.     Capillary Refill: Capillary refill takes less than 2 seconds.  Neurological:     Mental Status: She is alert.  Psychiatric:        Mood and Affect: Mood normal.     (all labs ordered are listed,  but only abnormal results are displayed) Labs Reviewed  COMPREHENSIVE METABOLIC PANEL WITH GFR - Abnormal; Notable for the following components:      Result Value   Sodium 133 (*)    Chloride 94 (*)    Glucose, Bld 158 (*)    Alkaline Phosphatase 155 (*)    All other components within normal limits  CBC - Abnormal; Notable for the following components:   WBC 14.7 (*)    All other components within normal limits  LIPASE, BLOOD    EKG: None  Radiology:  CT ABDOMEN PELVIS W CONTRAST Result Date: 07/12/2024 CLINICAL DATA:  Abdomen pain fever EXAM: CT ABDOMEN AND PELVIS WITH CONTRAST TECHNIQUE: Multidetector CT imaging of the abdomen and pelvis was performed using the standard protocol following bolus administration of intravenous contrast. RADIATION DOSE REDUCTION: This exam was performed according to the departmental dose-optimization program which includes automated exposure control, adjustment of the mA and/or kV according to patient size and/or use of iterative reconstruction technique. CONTRAST:  OMNIPAQUE  IOHEXOL  300 MG/ML  SOLN COMPARISON:  CT 01/16/2024 FINDINGS: Lower chest: Lung bases demonstrate no acute airspace disease. Hepatobiliary: No focal liver abnormality is seen. Status post cholecystectomy. No biliary dilatation. Pancreas: Unremarkable. No pancreatic ductal dilatation or surrounding inflammatory changes. Spleen: Normal in size without focal abnormality. Adrenals/Urinary Tract: Adrenal glands are normal. Kidneys show no hydronephrosis. The bladder is unremarkable Stomach/Bowel: The stomach is nonenlarged. No dilated small bowel. Prominent wall thickening involving the mid to distal transverse colon, splenic flexure, descending and sigmoid colon with pericolonic stranding and mucosal enhancement, consistent with colitis. Negative for perforation or abscess. Vascular/Lymphatic: Aortic atherosclerosis. No enlarged abdominal or pelvic lymph nodes. Reproductive: Hysterectomy.  No  adnexal mass Other: No ascites or free air. Musculoskeletal: No acute or suspicious osseous abnormality IMPRESSION: 1. Findings consistent with colitis involving the mid to distal transverse colon, splenic flexure, descending and sigmoid colon. No perforation or abscess. 2. Aortic atherosclerosis. Aortic Atherosclerosis (ICD10-I70.0). Electronically Signed   By: Luke Bun M.D.   On: 07/12/2024 21:23     Procedures   Medications Ordered in the ED  ondansetron  (ZOFRAN -ODT) disintegrating tablet 4 mg (4 mg Oral Given 07/12/24 1731)  lactated ringers  bolus 1,000 mL (0 mLs Intravenous Stopped 07/12/24 2223)  ondansetron  (ZOFRAN ) injection 4 mg (4 mg Intravenous Given 07/12/24 2030)  HYDROmorphone  (DILAUDID ) injection 0.5 mg (0.5 mg Intravenous Given 07/12/24 2030)  iohexol  (OMNIPAQUE ) 300 MG/ML solution 100 mL (100 mLs Intravenous Contrast Given 07/12/24 2045)  metroNIDAZOLE  (FLAGYL ) tablet 500 mg (500 mg Oral Given 07/12/24 2229)  ciprofloxacin (CIPRO) tablet 500 mg (500 mg Oral Given 07/12/24 2229)  HYDROmorphone  (DILAUDID ) injection 0.5 mg (0.5 mg Intravenous Given 07/12/24 2229)  ondansetron  (ZOFRAN ) injection 4 mg (4 mg Intravenous Given 07/12/24 2229)                                    Medical Decision Making Patient here for abdominal pain nausea and vomiting and diarrhea that has pretty much resolved except for the pain.  Was given Dilaudid  IV fluids and Zofran  and feeling much better.  Found to have colitis and a slight leukocytosis.  She is feeling much better after all these medicines and I will start her on Cipro and Flagyl  for her colitis as she does have a few drug allergies.  Will give her prescription for Zofran  as well.  Advise close up with primary care doctor, bland/clear liquid diet for the next 24 hours and return to the ER for any new or worsening symptoms.  Given very strict return precautions.  She is afebrile here with stable vitals.  All results and plan discussed with  patient and family at bedside.  Patient feels comfortable to plan to be discharged home.  Problems Addressed: Colitis: acute illness or injury Nausea and vomiting, unspecified vomiting type: acute illness or injury  Amount and/or Complexity of Data Reviewed External Data Reviewed: notes.    Details: Prior ED records reviewed and patient seen  on 6/16-25 for hyperglycemia Labs: ordered. Decision-making details documented in ED Course.    Details: Ordered and reviewed by me and patient with leukocytosis Radiology: ordered and independent interpretation performed. Decision-making details documented in ED Course.    Details: Ordered and interpreted by me independently of radiology CT abdomen pelvis: Shows evidence of colitis  Risk OTC drugs. Prescription drug management. Parenteral controlled substances. Drug therapy requiring intensive monitoring for toxicity.     Final diagnoses:  Colitis  Nausea and vomiting, unspecified vomiting type    ED Discharge Orders          Ordered    ondansetron  (ZOFRAN ) 4 MG tablet  Every 8 hours PRN        07/12/24 2149    ciprofloxacin (CIPRO) 500 MG tablet  Every 12 hours        07/12/24 2150    metroNIDAZOLE  (FLAGYL ) 500 MG tablet  2 times daily        07/12/24 2150               Gennaro Duwaine CROME, DO 07/12/24 2259

## 2024-07-12 NOTE — Discharge Instructions (Signed)
 Take your antibiotics as prescribed.  You can alternate Tylenol  and Motrin  as needed for pain.  You can take your Zofran  as needed for nausea and vomiting.  Return to the ER for any new or worsening symptoms including but not limited to worsening pain or worsening fevers.  Keep a bland/liquid diet for the next 24 hours.

## 2024-07-15 ENCOUNTER — Telehealth (INDEPENDENT_AMBULATORY_CARE_PROVIDER_SITE_OTHER): Payer: Self-pay | Admitting: Gastroenterology

## 2024-07-15 DIAGNOSIS — K529 Noninfective gastroenteritis and colitis, unspecified: Secondary | ICD-10-CM

## 2024-07-15 MED ORDER — DICYCLOMINE HCL 10 MG PO CAPS: 10.0000 mg | ORAL_CAPSULE | Freq: Two times a day (BID) | ORAL | 1 refills | Status: DC | PRN

## 2024-07-15 NOTE — Telephone Encounter (Signed)
 Patient complained of new onset abdominal pain, nausea and vomiting. Went to ED and found to had TC colitis. Was given ciprofloxacin  and flagyl . Still presenting abdominal pain.  Crystal, please send her an order for GI path panel, c. Diff and ova and parasite. Dx: gastroenteritis, colitis.  Patient should also make appointment for f/u in clinic.   Will send Bentyl .  Thanks,  Toribio Fortune, MD [facility]

## 2024-07-16 NOTE — Addendum Note (Signed)
 Addended by: Plato Alspaugh on: 07/16/2024 03:13 PM   Modules accepted: Orders

## 2024-07-16 NOTE — Telephone Encounter (Signed)
 Pt contacted and advised that provider would like her to have stool samples. Pt would like to go to [facility]. Lab orders placed. Pt states she her bowels are not moving at this time. Advised pt that when her bowels do move, she would need to go to lab to collect containers. Pt also informed that she would need a follow up in the office. Transferred pt up front.

## 2024-07-18 ENCOUNTER — Emergency Department (HOSPITAL_COMMUNITY)

## 2024-07-18 ENCOUNTER — Encounter (HOSPITAL_COMMUNITY): Payer: Self-pay

## 2024-07-18 ENCOUNTER — Emergency Department (HOSPITAL_COMMUNITY)
Admission: EM | Admit: 2024-07-18 | Discharge: 2024-07-18 | Disposition: A | Discharge status: Home / Self Care | Attending: Emergency Medicine | Admitting: Emergency Medicine

## 2024-07-18 ENCOUNTER — Other Ambulatory Visit: Payer: Self-pay

## 2024-07-18 DIAGNOSIS — I10 Essential (primary) hypertension: Secondary | ICD-10-CM | POA: Diagnosis not present

## 2024-07-18 DIAGNOSIS — Z794 Long term (current) use of insulin: Secondary | ICD-10-CM | POA: Insufficient documentation

## 2024-07-18 DIAGNOSIS — K529 Noninfective gastroenteritis and colitis, unspecified: Secondary | ICD-10-CM | POA: Diagnosis not present

## 2024-07-18 DIAGNOSIS — Z79899 Other long term (current) drug therapy: Secondary | ICD-10-CM | POA: Insufficient documentation

## 2024-07-18 DIAGNOSIS — R112 Nausea with vomiting, unspecified: Secondary | ICD-10-CM | POA: Diagnosis present

## 2024-07-18 DIAGNOSIS — E1065 Type 1 diabetes mellitus with hyperglycemia: Secondary | ICD-10-CM | POA: Diagnosis not present

## 2024-07-18 LAB — COMPREHENSIVE METABOLIC PANEL WITH GFR
ALT: 34 U/L (ref 0–44)
AST: 34 U/L (ref 15–41)
Albumin: 3.6 g/dL (ref 3.5–5.0)
Alkaline Phosphatase: 123 U/L (ref 38–126)
Anion gap: 14 (ref 5–15)
BUN: 7 mg/dL (ref 6–20)
CO2: 24 mmol/L (ref 22–32)
Calcium: 9.3 mg/dL (ref 8.9–10.3)
Chloride: 98 mmol/L (ref 98–111)
Creatinine, Ser: 0.86 mg/dL (ref 0.44–1.00)
GFR, Estimated: >60 mL/min (ref >60)
Glucose, Bld: 254 mg/dL — ABNORMAL HIGH (ref 70–99)
Potassium: 4.9 mmol/L (ref 3.5–5.1)
Sodium: 136 mmol/L (ref 135–145)
Total Bilirubin: 0.3 mg/dL (ref 0.0–1.2)
Total Protein: 6.3 g/dL — ABNORMAL LOW (ref 6.5–8.1)

## 2024-07-18 LAB — URINALYSIS, ROUTINE W REFLEX MICROSCOPIC
Bacteria, UA: NONE SEEN
Bilirubin Urine: NEGATIVE
Glucose, UA: >=500 mg/dL — AB
Hgb urine dipstick: NEGATIVE
Ketones, ur: NEGATIVE mg/dL
Leukocytes,Ua: NEGATIVE
Nitrite: NEGATIVE
Protein, ur: NEGATIVE mg/dL
Specific Gravity, Urine: 1.008 (ref 1.005–1.030)
pH: 6 (ref 5.0–8.0)

## 2024-07-18 LAB — CBC
HCT: 40 % (ref 36.0–46.0)
Hemoglobin: 13.8 g/dL (ref 12.0–15.0)
MCH: 30.8 pg (ref 26.0–34.0)
MCHC: 34.5 g/dL (ref 30.0–36.0)
MCV: 89.3 fL (ref 80.0–100.0)
Platelets: 432 K/uL — ABNORMAL HIGH (ref 150–400)
RBC: 4.48 MIL/uL (ref 3.87–5.11)
RDW: 12.9 % (ref 11.5–15.5)
WBC: 12 K/uL — ABNORMAL HIGH (ref 4.0–10.5)
nRBC: 0 % (ref 0.0–0.2)

## 2024-07-18 LAB — CBG MONITORING, ED: Glucose-Capillary: 398 mg/dL — ABNORMAL HIGH (ref 70–99)

## 2024-07-18 LAB — LIPASE, BLOOD: Lipase: 10 U/L — ABNORMAL LOW (ref 11–51)

## 2024-07-18 MED ORDER — SODIUM CHLORIDE 0.9 % IV BOLUS
1000.0000 mL | Freq: Once | INTRAVENOUS | Status: AC
  Administered 2024-07-18: 19:00:00 1000 mL via INTRAVENOUS

## 2024-07-18 MED ORDER — IOHEXOL 300 MG/ML  SOLN
100.0000 mL | Freq: Once | INTRAMUSCULAR | Status: AC | PRN
  Administered 2024-07-18: 21:00:00 100 mL via INTRAVENOUS

## 2024-07-18 MED ORDER — HYDROMORPHONE HCL 1 MG/ML IJ SOLN
1.0000 mg | Freq: Once | INTRAMUSCULAR | Status: AC
  Administered 2024-07-18: 23:00:00 1 mg via INTRAVENOUS
  Filled 2024-07-18: qty 1

## 2024-07-18 MED ORDER — HYDROCODONE-ACETAMINOPHEN 5-325 MG PO TABS: 1.0000 | ORAL_TABLET | Freq: Three times a day (TID) | ORAL | 0 refills | Status: DC | PRN

## 2024-07-18 MED ORDER — SODIUM CHLORIDE 0.9 % IV SOLN
12.5000 mg | Freq: Once | INTRAVENOUS | Status: AC
  Administered 2024-07-18: 23:00:00 12.5 mg via INTRAVENOUS
  Filled 2024-07-18: qty 0.5

## 2024-07-18 MED ORDER — PROMETHAZINE HCL 25 MG PO TABS: 25.0000 mg | ORAL_TABLET | Freq: Four times a day (QID) | ORAL | 0 refills | Status: DC | PRN

## 2024-07-18 MED ORDER — MORPHINE SULFATE (PF) 4 MG/ML IV SOLN
4.0000 mg | Freq: Once | INTRAVENOUS | Status: AC
  Administered 2024-07-18: 19:00:00 4 mg via INTRAVENOUS
  Filled 2024-07-18: qty 1

## 2024-07-18 MED ORDER — ONDANSETRON HCL 4 MG/2ML IJ SOLN
4.0000 mg | Freq: Once | INTRAMUSCULAR | Status: AC
  Administered 2024-07-18: 19:00:00 4 mg via INTRAVENOUS
  Filled 2024-07-18: qty 2

## 2024-07-18 MED ADMIN — Metoclopramide HCl Inj 5 MG/ML (Base Equivalent): 10 mg | INTRAVENOUS | @ 20:00:00 | NDC 00409341401

## 2024-07-18 MED FILL — Metoclopramide HCl Inj 5 MG/ML (Base Equivalent): 10.0000 mg | INTRAMUSCULAR | Qty: 2 | Status: AC

## 2024-07-18 NOTE — ED Notes (Signed)
 See triage notes. A/o. Nad. Dry mm.

## 2024-07-18 NOTE — ED Notes (Signed)
Patient ambulated with stand by assistance to bathroom   

## 2024-07-18 NOTE — Discharge Instructions (Addendum)
 As discussed, your lab tests today are stable, your white blood cell count is improving and your CT scan suggest that your colitis is also improving.  It will be important for you to complete the antibiotics you were given at your last visit here.  Also make sure you are putting your insulin  pump back on once you get home to get your blood glucose under better control.  It will be very important that you are cautious with taking the the hydrocodone  as this medication will make you drowsy.  You cannot take this medication within 2 hours of taking one of your diazepam  tablets.  Do not drive within 4 hours of taking this medication as well.  Plan follow-up care with Dr. Eartha.

## 2024-07-18 NOTE — ED Notes (Signed)
 Patient transported to CT

## 2024-07-18 NOTE — ED Provider Notes (Signed)
 [place] EMERGENCY DEPARTMENT AT [facility] Provider Note   CSN: [id number] Arrival date & time: 07/18/24  1645     Patient presents with: Emesis   [name] is a 53 y.o. female with a history significant for hypertension, GERD, IBS, type 1 diabetes and history of hiatal hernia presenting for evaluation of persistent periumbilical abdominal pain along with nausea vomiting and diarrhea.  She was seen here on December 11 with similar complaint at which time CT imaging revealed a significant colitis involving the mid to distal transverse splenic flexure descending and sigmoid colon.  She was placed on Cipro  and Flagyl  and prescribe Zofran  but has worsening pain.  Her vomiting was improved briefly but has returned, has had 4 episodes of nonbloody emesis today.  She also had 1 diarrheal stool today which she describes as dark but not bloody.  She has had fever  to 102 earlier today.  She has taken Tylenol  for fever reduction.  She has no dysuria.  Prior to this event she describes chronic constipation, she is under the care of Dr. Eartha, she states the constipation is felt to be possibly secondary to a pelvic floor weakness and has been referred to gynecology which she will see next month.  She denies contact with others with similar symptoms, no recent antibiotic use prior to the onset of the symptoms, no foreign travel.  {Add pertinent medical, surgical, social history, OB history to YEP:[id number]} The history is provided by the patient.       Prior to Admission medications  Medication Sig Start Date End Date Taking? Authorizing Provider  Calcium  Polycarbophil (FIBERCON PO) Take by mouth 3 (three) times daily.    [provider]  ciprofloxacin  (CIPRO ) 500 MG tablet Take 1 tablet (500 mg total) by mouth every 12 (twelve) hours for 7 days. 07/12/24 07/19/24  Kammerer, Duwaine L, DO  Continuous Glucose Receiver (DEXCOM G7 RECEIVER) DEVI Use to monitor glucose  continuously 10/05/23   Trixie File, MD  Continuous Glucose Sensor (DEXCOM G7 SENSOR) MISC Use to check glucose continuously, change sensor every 10 days 06/18/24   Trixie File, MD  Continuous Glucose Transmitter (DEXCOM G6 TRANSMITTER) MISC Use as directed change every 90 days. 09/20/23   Trixie File, MD  diazepam  (VALIUM ) 5 MG tablet Take 5 mg by mouth 3 (three) times daily as needed for anxiety.     [provider]  dicyclomine  (BENTYL ) 10 MG capsule Take 1 capsule (10 mg total) by mouth every 12 (twelve) hours as needed (abdominal pain). 07/15/24   [name], MD  docusate sodium  (COLACE) 100 MG capsule Take 100-200 mg by mouth See admin instructions. Take 100 mg by mouth in the morning and 200 mg in the evening    [provider]  EPIPEN  2-PAK 0.3 MG/0.3ML SOAJ injection Inject 0.3 mg into the muscle as needed for anaphylaxis.  02/13/16   [provider]  Glucagon  3 MG/DOSE POWD Place 3 mg into the nose once as needed for up to 1 dose. 06/07/24   Trixie File, MD  ibuprofen  (ADVIL ) 800 MG tablet TAKE 1 TABLET BY MOUTH 3 TIMES DAILY. Patient taking differently: Take 800 mg by mouth as needed. 08/13/22   Janit Thresa HERO, DPM  insulin  aspart (NOVOLOG ) 100 UNIT/ML injection inject up to 60 units in the pump daily. 06/18/24   Trixie File, MD  Insulin  Aspart FlexPen (NOVOLOG ) 100 UNIT/ML inject up to 60 units in the pump daily. 06/07/24   Gherghe,  Lela, MD  Insulin  Disposable Pump (OMNIPOD 5 G7 PODS, GEN 5,) MISC 1 each by Does not apply route every 3 (three) days. 01/10/24   Trixie Lela, MD  Insulin  Pen Needle 32G X 4 MM MISC Use 1x a day 08/24/23   Trixie Lela, MD  lisinopril  (PRINIVIL ,ZESTRIL ) 5 MG tablet Take 5 mg by mouth daily. 06/10/15   [provider]  metroNIDAZOLE  (FLAGYL ) 500 MG tablet Take 1 tablet (500 mg total) by mouth 2 (two) times daily for 7 days. 07/12/24 07/19/24  Kammerer, Duwaine L, DO   nitroGLYCERIN  (NITROSTAT ) 0.4 MG SL tablet Place 1 tablet (0.4 mg total) under the tongue every 5 (five) minutes x 3 doses as needed for chest pain (if no relief after 3rd dose, proceed to the ED for an evaluation or call 911). 11/02/22 06/07/24  Miriam Norris, NP  promethazine  (PHENERGAN ) 25 MG tablet Take 1 tablet (25 mg total) by mouth every 6 (six) hours as needed for nausea or vomiting. 05/24/22   Janit Thresa HERO, DPM  rosuvastatin (CRESTOR) 40 MG tablet Take 40 mg by mouth daily.    [provider]  venlafaxine XR (EFFEXOR-XR) 150 MG 24 hr capsule Take 150 mg by mouth daily. 01/09/24   [provider]    Allergies: Bee venom, Zocor  [simvastatin ], Clindamycin /lincomycin, Codeine, Penicillins, and Sulfa antibiotics    Review of Systems  Constitutional:  Positive for fever.  HENT:  Negative for congestion.   Eyes: Negative.   Respiratory:  Negative for chest tightness and shortness of breath.   Cardiovascular:  Negative for chest pain.  Gastrointestinal:  Positive for abdominal pain, diarrhea, nausea and vomiting.  Genitourinary: Negative.   Musculoskeletal:  Negative for arthralgias, joint swelling and neck pain.  Skin: Negative.  Negative for rash and wound.  Neurological:  Negative for dizziness, weakness, light-headedness, numbness and headaches.  Psychiatric/Behavioral: Negative.      Updated Vital Signs BP 106/72 (BP Location: Right Arm)   Pulse (!) 104   Temp 98.6 F (37 C)   Resp 18   Ht 5' 7 (1.702 m)   Wt 62.5 kg   SpO2 99%   BMI 21.58 kg/m   Physical Exam Vitals and nursing note reviewed.  Constitutional:      Appearance: She is well-developed.  HENT:     Head: Normocephalic and atraumatic.  Eyes:     Conjunctiva/sclera: Conjunctivae normal.  Cardiovascular:     Rate and Rhythm: Normal rate and regular rhythm.     Heart sounds: Normal heart sounds.  Pulmonary:     Effort: Pulmonary effort is normal.     Breath sounds: Normal breath sounds.  No wheezing.  Abdominal:     General: Bowel sounds are normal.     Palpations: Abdomen is soft. There is no mass.     Tenderness: There is abdominal tenderness in the periumbilical area. There is guarding. There is no rebound.     Hernia: No hernia is present.  Musculoskeletal:        General: Normal range of motion.     Cervical back: Normal range of motion.  Skin:    General: Skin is warm and dry.  Neurological:     Mental Status: She is alert.     (all labs ordered are listed, but only abnormal results are displayed) Labs Reviewed  LIPASE, BLOOD - Abnormal; Notable for the following components:      Result Value   Lipase 10 (*)    All other components  within normal limits  COMPREHENSIVE METABOLIC PANEL WITH GFR - Abnormal; Notable for the following components:   Glucose, Bld 254 (*)    Total Protein 6.3 (*)    All other components within normal limits  CBC - Abnormal; Notable for the following components:   WBC 12.0 (*)    Platelets 432 (*)    All other components within normal limits  URINALYSIS, ROUTINE W REFLEX MICROSCOPIC - Abnormal; Notable for the following components:   Glucose, UA >=500 (*)    All other components within normal limits    EKG: None  Radiology: No results found.  {Document cardiac monitor, telemetry assessment procedure when appropriate:32947} Procedures   Medications Ordered in the ED  morphine  (PF) 4 MG/ML injection 4 mg (has no administration in time range)  ondansetron  (ZOFRAN ) injection 4 mg (has no administration in time range)      {Click here for ABCD2, HEART and other calculators REFRESH Note before signing:1}                              Medical Decision Making Amount and/or Complexity of Data Reviewed Labs: ordered.  Risk Prescription drug management.     {Document critical care time when appropriate  Document review of labs and clinical decision tools ie CHADS2VASC2, etc  Document your independent review of  radiology images and any outside records  Document your discussion with family members, caretakers and with consultants  Document social determinants of health affecting pt's care  Document your decision making why or why not admission, treatments were needed:32947:::1}   Final diagnoses:  None    ED Discharge Orders     None

## 2024-07-18 NOTE — ED Triage Notes (Signed)
 Pt arrived via POV from home c/o recurrent N/V/D. Pt seen here recently for same and has followed up with Dr Samuel office. Pt reports 4 episodes of emesis today and diarrhea as well. Pt reports she has been taking her prescribed antibiotics as directed.

## 2024-07-18 NOTE — Telephone Encounter (Signed)
 Pt contacted office and wanted to let Dr.Castaneda know that her pain has worsened and she began to vomit last night. Pt is going to be heading back to ER.

## 2024-07-24 ENCOUNTER — Ambulatory Visit

## 2024-07-30 ENCOUNTER — Encounter (HOSPITAL_COMMUNITY): Payer: Self-pay | Admitting: *Deleted

## 2024-07-30 ENCOUNTER — Emergency Department (HOSPITAL_COMMUNITY)

## 2024-07-30 ENCOUNTER — Other Ambulatory Visit: Payer: Self-pay

## 2024-07-30 ENCOUNTER — Telehealth (INDEPENDENT_AMBULATORY_CARE_PROVIDER_SITE_OTHER): Payer: Self-pay | Admitting: Gastroenterology

## 2024-07-30 ENCOUNTER — Emergency Department (HOSPITAL_COMMUNITY)
Admission: EM | Admit: 2024-07-30 | Discharge: 2024-07-31 | Disposition: A | Discharge status: Home / Self Care | Source: Ambulatory Visit | Attending: Emergency Medicine | Admitting: Emergency Medicine

## 2024-07-30 DIAGNOSIS — R109 Unspecified abdominal pain: Secondary | ICD-10-CM

## 2024-07-30 DIAGNOSIS — R112 Nausea with vomiting, unspecified: Secondary | ICD-10-CM | POA: Insufficient documentation

## 2024-07-30 DIAGNOSIS — I10 Essential (primary) hypertension: Secondary | ICD-10-CM | POA: Insufficient documentation

## 2024-07-30 DIAGNOSIS — Z794 Long term (current) use of insulin: Secondary | ICD-10-CM | POA: Insufficient documentation

## 2024-07-30 DIAGNOSIS — F17210 Nicotine dependence, cigarettes, uncomplicated: Secondary | ICD-10-CM | POA: Insufficient documentation

## 2024-07-30 DIAGNOSIS — Z8673 Personal history of transient ischemic attack (TIA), and cerebral infarction without residual deficits: Secondary | ICD-10-CM | POA: Insufficient documentation

## 2024-07-30 DIAGNOSIS — Z79899 Other long term (current) drug therapy: Secondary | ICD-10-CM | POA: Insufficient documentation

## 2024-07-30 DIAGNOSIS — E1040 Type 1 diabetes mellitus with diabetic neuropathy, unspecified: Secondary | ICD-10-CM | POA: Insufficient documentation

## 2024-07-30 DIAGNOSIS — K5900 Constipation, unspecified: Secondary | ICD-10-CM | POA: Insufficient documentation

## 2024-07-30 LAB — COMPREHENSIVE METABOLIC PANEL WITH GFR
ALT: 36 U/L (ref 0–44)
AST: 36 U/L (ref 15–41)
Albumin: 3.9 g/dL (ref 3.5–5.0)
Alkaline Phosphatase: 130 U/L — ABNORMAL HIGH (ref 38–126)
Anion gap: 13 (ref 5–15)
BUN: 8 mg/dL (ref 6–20)
CO2: 23 mmol/L (ref 22–32)
Calcium: 8.9 mg/dL (ref 8.9–10.3)
Chloride: 99 mmol/L (ref 98–111)
Creatinine, Ser: 0.58 mg/dL (ref 0.44–1.00)
GFR, Estimated: >60 mL/min
Glucose, Bld: 241 mg/dL — ABNORMAL HIGH (ref 70–99)
Potassium: 4.6 mmol/L (ref 3.5–5.1)
Sodium: 135 mmol/L (ref 135–145)
Total Bilirubin: 0.3 mg/dL (ref 0.0–1.2)
Total Protein: 6.4 g/dL — ABNORMAL LOW (ref 6.5–8.1)

## 2024-07-30 LAB — CBC WITH DIFFERENTIAL/PLATELET
Abs Immature Granulocytes: 0.01 K/uL (ref 0.00–0.07)
Basophils Absolute: 0.1 K/uL (ref 0.0–0.1)
Basophils Relative: 1 %
Eosinophils Absolute: 0.2 K/uL (ref 0.0–0.5)
Eosinophils Relative: 3 %
HCT: 40.9 % (ref 36.0–46.0)
Hemoglobin: 13.8 g/dL (ref 12.0–15.0)
Immature Granulocytes: 0 %
Lymphocytes Relative: 36 %
Lymphs Abs: 2.7 K/uL (ref 0.7–4.0)
MCH: 30.5 pg (ref 26.0–34.0)
MCHC: 33.7 g/dL (ref 30.0–36.0)
MCV: 90.3 fL (ref 80.0–100.0)
Monocytes Absolute: 0.7 K/uL (ref 0.1–1.0)
Monocytes Relative: 9 %
Neutro Abs: 4 K/uL (ref 1.7–7.7)
Neutrophils Relative %: 51 %
Platelets: 358 K/uL (ref 150–400)
RBC: 4.53 MIL/uL (ref 3.87–5.11)
RDW: 13.6 % (ref 11.5–15.5)
WBC: 7.6 K/uL (ref 4.0–10.5)
nRBC: 0 % (ref 0.0–0.2)

## 2024-07-30 LAB — LACTIC ACID, PLASMA: Lactic Acid, Venous: 0.8 mmol/L (ref 0.5–1.9)

## 2024-07-30 LAB — RESP PANEL BY RT-PCR (RSV, FLU A&B, COVID)  RVPGX2
Influenza A by PCR: NEGATIVE
Influenza B by PCR: NEGATIVE
Resp Syncytial Virus by PCR: NEGATIVE
SARS Coronavirus 2 by RT PCR: NEGATIVE

## 2024-07-30 LAB — URINALYSIS, ROUTINE W REFLEX MICROSCOPIC
Bilirubin Urine: NEGATIVE
Glucose, UA: 150 mg/dL — AB
Hgb urine dipstick: NEGATIVE
Ketones, ur: NEGATIVE mg/dL
Leukocytes,Ua: NEGATIVE
Nitrite: NEGATIVE
Protein, ur: NEGATIVE mg/dL
Specific Gravity, Urine: 1.009 (ref 1.005–1.030)
pH: 6 (ref 5.0–8.0)

## 2024-07-30 LAB — CBG MONITORING, ED
Glucose-Capillary: 197 mg/dL — ABNORMAL HIGH (ref 70–99)
Glucose-Capillary: 297 mg/dL — ABNORMAL HIGH (ref 70–99)
Glucose-Capillary: 348 mg/dL — ABNORMAL HIGH (ref 70–99)

## 2024-07-30 LAB — LIPASE, BLOOD: Lipase: 12 U/L (ref 11–51)

## 2024-07-30 MED ORDER — MORPHINE SULFATE (PF) 4 MG/ML IV SOLN
4.0000 mg | Freq: Once | INTRAVENOUS | Status: AC
  Administered 2024-07-30: 4 mg via INTRAVENOUS
  Filled 2024-07-30: qty 1

## 2024-07-30 MED ORDER — ONDANSETRON HCL 4 MG/2ML IJ SOLN
4.0000 mg | Freq: Once | INTRAMUSCULAR | Status: AC
  Administered 2024-07-30: 4 mg via INTRAVENOUS
  Filled 2024-07-30: qty 2

## 2024-07-30 MED ORDER — INSULIN ASPART 100 UNIT/ML IJ SOLN
10.0000 [IU] | Freq: Once | INTRAMUSCULAR | Status: AC
  Administered 2024-07-30: 10 [IU] via SUBCUTANEOUS
  Filled 2024-07-30: qty 1

## 2024-07-30 MED ORDER — FAMOTIDINE IN NACL 20-0.9 MG/50ML-% IV SOLN
20.0000 mg | Freq: Once | INTRAVENOUS | Status: AC
  Administered 2024-07-30: 20 mg via INTRAVENOUS
  Filled 2024-07-30: qty 50

## 2024-07-30 MED ORDER — HYDROMORPHONE HCL 1 MG/ML IJ SOLN
1.0000 mg | Freq: Once | INTRAMUSCULAR | Status: AC
  Administered 2024-07-31: 1 mg via INTRAVENOUS
  Filled 2024-07-30: qty 1

## 2024-07-30 MED ORDER — SODIUM CHLORIDE 0.9 % IV BOLUS
1000.0000 mL | Freq: Once | INTRAVENOUS | Status: AC
  Administered 2024-07-30: 1000 mL via INTRAVENOUS

## 2024-07-30 MED ORDER — IOHEXOL 350 MG/ML SOLN
100.0000 mL | Freq: Once | INTRAVENOUS | Status: AC | PRN
  Administered 2024-07-30: 100 mL via INTRAVENOUS

## 2024-07-30 NOTE — ED Triage Notes (Signed)
 Pt c/o abd pain with N/V, denies any diarrhea.  GI sent pt here for CT scan with and without contrast. Pt has an insulin  pump and took off an hour ago for the CT.

## 2024-07-30 NOTE — Discharge Instructions (Signed)
  It was a pleasure caring for you today in the emergency department.  Please follow up with your primary care doctor within 2-3 days. For constipation we also recommend a diet high in fiber (beans, fruits, vegetables, whole grains). Take Colace 100-200 mg up to three times per day. You may take along with Senokot 1-2 tabs, ingest with full glass of water .  You may also take MiraLAX  1-2 capfuls 1-2 times a day until stools become soft and then slowly decrease the amount of MiraLAX  used.  Maintain fluid intake 6-8 glasses per day. Please increase fibers in your diet. You may also take Milk of Magnesia 30 mL as needed for constipation, you may repeat in 2 hours again if no bowl movement.   Please return to the emergency department for any worsening or worrisome symptoms.

## 2024-07-30 NOTE — ED Provider Notes (Signed)
 " [place] [facility] AT [facility] Provider Note  CSN: [id number] Arrival date & time: 07/30/24 1814  Chief Complaint(s) Abdominal Pain  HPI [name] is a 53 y.o. female with past medical history as below, significant for DM, GERD, HLD, HTN, IBS, migraine disorder, panic disorder who presents to the ED with complaint of tunnel pain, nausea vomiting constipation  Symptoms ongoing over the past 3 weeks.  This is her third ER visit for the same.  Symptoms have not improved with antibiotics.  She was put on Bentyl  and now is feeling as though she is constipated.  Pain has persisted.  Sharp and stabbing.  Constant.  Having ongoing nausea, no vomiting today.  Emesis nonbloody nonbilious.  No blood in stool or melanotic stool.  She has had very poor appetite over the past week or so, food makes her feel very nauseated.  No dysphagia.  She called GI office and they recommend she come to the ER for further evaluation and CTA.  Subjective fever at home the last few days, occasional chills, body aches.  Denies sick contacts or recent travel.  No suspicious p.o. intake.  Past Medical History Past Medical History:  Diagnosis Date   Benign paroxysmal vertigo    Cervicalgia    Depression    Diabetes mellitus (HCC)    GERD (gastroesophageal reflux disease)    Hiatal hernia    History of cardiac catheterization    Normal coronary arteries 2016 and 2021   History of TIA (transient ischemic attack)    Hyperlipidemia    Hypertension    IBS (irritable bowel syndrome)    Migraine headache    Panic disorder with agoraphobia    Peripheral neuropathy    Subungual hematoma of digit of hand    Tendonitis    Vertigo    Patient Active Problem List   Diagnosis Date Noted   Abdominal pain 01/20/2024   Loss of weight 01/09/2024   Melena 01/09/2024   Nausea without vomiting 01/09/2024   IBS (irritable bowel syndrome) 10/08/2021   DKA (diabetic ketoacidosis) (HCC) 09/29/2018    Dehydration 09/29/2018   AKI (acute kidney injury) 09/29/2018   Hyperkalemia 09/29/2018   Hyponatremia 09/29/2018   S/P carpal tunnel release right 08/24/18 09/14/2018   Carpal tunnel syndrome of right wrist    Depression 01/06/2018   Constipation 01/05/2018   Family hx of colon cancer 11/10/2017   Chest pain 03/25/2015   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   Migraine 03/25/2015   Hypertension    Hyperlipidemia    Anxiety    Pain in the chest    Essential hypertension    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 Authorizing Provider  Calcium  Polycarbophil (FIBERCON PO) Take by mouth 3 (three) times daily.    [provider]  Continuous Glucose Receiver (DEXCOM G7 RECEIVER) DEVI Use to monitor glucose continuously 10/05/23   Trixie File, MD  Continuous Glucose Sensor (DEXCOM G7 SENSOR) MISC Use to check glucose continuously, change sensor every 10 days 06/18/24   Trixie File, MD  Continuous Glucose Transmitter (DEXCOM G6 TRANSMITTER) MISC Use as directed change every 90 days. 09/20/23   Trixie File, MD  diazepam  (VALIUM ) 5 MG tablet  Take 5 mg by mouth 3 (three) times daily as needed for anxiety.     [provider]  dicyclomine  (BENTYL ) 10 MG capsule Take 1 capsule (10 mg total) by mouth every 12 (twelve) hours as needed (abdominal pain). 07/15/24   [name], MD  docusate sodium  (COLACE) 100 MG capsule Take 100-200 mg by mouth See admin instructions. Take 100 mg by mouth in the morning and 200 mg in the evening    [provider]  EPIPEN  2-PAK 0.3 MG/0.3ML SOAJ injection Inject 0.3 mg into the muscle as  needed for anaphylaxis.  02/13/16   [provider]  Glucagon  3 MG/DOSE POWD Place 3 mg into the nose once as needed for up to 1 dose. 06/07/24   Trixie File, MD  HYDROcodone -acetaminophen  (NORCO/VICODIN) 5-325 MG tablet Take 1 tablet by mouth every 8 (eight) hours as needed. 07/18/24   Idol, Julie, PA-C  ibuprofen  (ADVIL ) 800 MG tablet TAKE 1 TABLET BY MOUTH 3 TIMES DAILY. Patient taking differently: Take 800 mg by mouth as needed. 08/13/22   Janit Thresa HERO, DPM  insulin  aspart (NOVOLOG ) 100 UNIT/ML injection inject up to 60 units in the pump daily. 06/18/24   Trixie File, MD  Insulin  Aspart FlexPen (NOVOLOG ) 100 UNIT/ML inject up to 60 units in the pump daily. 06/07/24   Trixie File, MD  Insulin  Disposable Pump (OMNIPOD 5 G7 PODS, GEN 5,) MISC 1 each by Does not apply route every 3 (three) days. 01/10/24   Trixie File, MD  Insulin  Pen Needle 32G X 4 MM MISC Use 1x a day 08/24/23   Trixie File, MD  lisinopril  (PRINIVIL ,ZESTRIL ) 5 MG tablet Take 5 mg by mouth daily. 06/10/15   [provider]  nitroGLYCERIN  (NITROSTAT ) 0.4 MG SL tablet Place 1 tablet (0.4 mg total) under the tongue every 5 (five) minutes x 3 doses as needed for chest pain (if no relief after 3rd dose, proceed to the ED for an evaluation or call 911). 11/02/22 06/07/24  Miriam Norris, NP  promethazine  (PHENERGAN ) 25 MG tablet Take 1 tablet (25 mg total) by mouth every 6 (six) hours as needed for nausea or vomiting. 07/18/24   Idol, Julie, PA-C  rosuvastatin (CRESTOR) 40 MG tablet Take 40 mg by mouth daily.    [provider]  venlafaxine XR (EFFEXOR-XR) 150 MG 24 hr capsule Take 150 mg by mouth daily. 01/09/24   [provider]                                                                                                                                    Past Surgical History Past Surgical History:  Procedure Laterality Date   ABDOMINAL HYSTERECTOMY     APPENDECTOMY      BREAST BIOPSY Right 12/01/2022   MM RT BREAST BX W LOC DEV 1ST LESION IMAGE BX SPEC STEREO GUIDE 12/01/2022 [facility]   CARDIAC CATHETERIZATION  last in 2009   x 4, normal coronary arteries   CARDIAC CATHETERIZATION N/A 03/26/2015   Procedure: Left Heart Cath and Coronary Angiography;  Surgeon: Deatrice DELENA Cage, MD;  Location: [facility];  Service: Cardiovascular;  Laterality: N/A;   CARPAL TUNNEL RELEASE Right 08/24/2018   Procedure: CARPAL TUNNEL RELEASE;  Surgeon: Margrette Taft BRAVO, MD;  Location: [facility];  Service: Orthopedics;  Laterality: Right;   CESAREAN SECTION     CHOLECYSTECTOMY     COLONOSCOPY N/A 01/20/2024   Procedure: COLONOSCOPY;  Surgeon: [name], MD;  Location: [facility];  Service: Gastroenterology;  Laterality: N/A;  930am, asa 2   COLONOSCOPY WITH PROPOFOL  N/A 01/09/2018   Procedure: COLONOSCOPY WITH PROPOFOL ;  Surgeon: Golda Claudis PENNER, MD;  Location: [facility];  Service: Endoscopy;  Laterality: N/A;   COLONOSCOPY WITH PROPOFOL  N/A 09/21/2019   Procedure: COLONOSCOPY WITH PROPOFOL ;  Surgeon: Golda Claudis PENNER, MD;  Location: [facility];  Service: Endoscopy;  Laterality: N/A;  1055   CYST EXCISION     right breast   ESOPHAGOGASTRODUODENOSCOPY N/A 01/20/2024   Procedure: EGD (ESOPHAGOGASTRODUODENOSCOPY);  Surgeon: Eartha Angelia, Sieving, MD;  Location: [facility];  Service: Gastroenterology;  Laterality: N/A;   ESOPHAGOGASTRODUODENOSCOPY (EGD) WITH PROPOFOL  N/A 01/09/2018   Procedure: ESOPHAGOGASTRODUODENOSCOPY (EGD) WITH PROPOFOL ;  Surgeon: Golda Claudis PENNER, MD;  Location: [facility];  Service: Endoscopy;  Laterality: N/A;   LEFT HEART CATH AND CORONARY ANGIOGRAPHY N/A 02/25/2020   Procedure: LEFT HEART CATH AND CORONARY ANGIOGRAPHY;  Surgeon: Burnard Debby DELENA, MD;  Location: [facility];  Service: Cardiovascular;  Laterality: N/A;   POLYPECTOMY  09/21/2019   Procedure: POLYPECTOMY;  Surgeon: Golda Claudis PENNER, MD;   Location: [facility];  Service: Endoscopy;;  ascending colon;   TOE SURGERY Right    3 rd and 4 th toes   TOOTH EXTRACTION Bilateral 02/16/2018   Procedure: 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 RIGHT MAXILLARY SINUS ANTROSTOMY;  Surgeon: Sheryle Hamilton, DDS;  Location: [facility];  Service: Oral Surgery;  Laterality: Bilateral;   TUBAL LIGATION     Family History Family History  Problem Relation Age of Onset   Stroke Mother 73   Heart attack Mother 37   Cancer Sister        colon   Heart failure Maternal Grandmother 70   Cancer Maternal Grandfather        prostate   Heart failure Paternal Grandmother 55   Heart failure Paternal Grandfather 67    Social History Social History[1] Allergies Bee venom, Zocor  [simvastatin ], Clindamycin /lincomycin, Codeine, Penicillins, and Sulfa antibiotics  Review of Systems A thorough review of systems was obtained and all systems are negative except as noted in the HPI and PMH.   Physical Exam Vital Signs  I have reviewed the triage vital signs BP (!) 111/51 (BP Location: Right Arm)   Pulse 85   Temp 98.2 F (36.8 C) (Oral)   Resp 15   Ht 5' 7 (1.702 m)   Wt 61.2 kg   SpO2 96%   BMI 21.14 kg/m  Physical Exam Vitals and nursing note reviewed.  Constitutional:      General: She is not in acute distress.    Appearance: Normal appearance. She is well-developed. She is not ill-appearing.  HENT:     Head: Normocephalic and atraumatic.     Right Ear: External ear normal.     Left Ear: External ear normal.  Nose: Nose normal.     Mouth/Throat:     Mouth: Mucous membranes are moist.  Eyes:     General: No scleral icterus.       Right eye: No discharge.        Left eye: No discharge.  Cardiovascular:     Rate and Rhythm: Normal rate.  Pulmonary:     Effort: Pulmonary effort is normal. No respiratory distress.     Breath sounds: No stridor.  Abdominal:     General: Abdomen is flat. There is no distension.      Palpations: Abdomen is soft.     Tenderness: There is abdominal tenderness in the right upper quadrant, epigastric area and left upper quadrant. There is no guarding.   Musculoskeletal:        General: No deformity.     Cervical back: No rigidity.  Skin:    General: Skin is warm and dry.     Coloration: Skin is not cyanotic, jaundiced or pale.  Neurological:     Mental Status: She is alert.  Psychiatric:        Speech: Speech normal.        Behavior: Behavior normal. Behavior is cooperative.     ED Results and Treatments Labs (all labs ordered are listed, but only abnormal results are displayed) Labs Reviewed  COMPREHENSIVE METABOLIC PANEL WITH GFR - Abnormal; Notable for the following components:      Result Value   Glucose, Bld 241 (*)    Total Protein 6.4 (*)    Alkaline Phosphatase 130 (*)    All other components within normal limits  URINALYSIS, ROUTINE W REFLEX MICROSCOPIC - Abnormal; Notable for the following components:   Glucose, UA 150 (*)    All other components within normal limits  CBG MONITORING, ED - Abnormal; Notable for the following components:   Glucose-Capillary 348 (*)    All other components within normal limits  CBG MONITORING, ED - Abnormal; Notable for the following components:   Glucose-Capillary 297 (*)    All other components within normal limits  RESP PANEL BY RT-PCR (RSV, FLU A&B, COVID)  RVPGX2  CBC WITH DIFFERENTIAL/PLATELET  LIPASE, BLOOD  LACTIC ACID, PLASMA  CBG MONITORING, ED                                                                                                                          Radiology No results found.  Pertinent labs & imaging results that were available during my care of the patient were reviewed by me and considered in my medical decision making (see MDM for details).  Medications Ordered in ED Medications  sodium chloride  0.9 % bolus 1,000 mL (0 mLs Intravenous Stopped 07/30/24 2206)  ondansetron  (ZOFRAN )  injection 4 mg (4 mg Intravenous Given 07/30/24 2109)  morphine  (PF) 4 MG/ML injection 4 mg (4 mg Intravenous Given 07/30/24 2108)  famotidine  (PEPCID ) IVPB 20 mg premix (0 mg Intravenous Stopped 07/30/24 2151)  insulin   aspart (novoLOG ) injection 10 Units (10 Units Subcutaneous Given 07/30/24 2117)  iohexol  (OMNIPAQUE ) 350 MG/ML injection 100 mL (100 mLs Intravenous Contrast Given 07/30/24 2252)                                                                                                                                     Procedures Procedures  (including critical care time)  Medical Decision Making / ED Course    Medical Decision Making:    [name] is a 53 y.o. female with past medical history as below, significant for DM, GERD, HLD, HTN, IBS, migraine disorder, panic disorder who presents to the ED with complaint of tunnel pain, nausea vomiting constipation. The complaint involves an extensive differential diagnosis and also carries with it a high risk of complications and morbidity.  Serious etiology was considered. Ddx includes but is not limited to: Differential diagnosis includes but is not exclusive to acute cholecystitis, intrathoracic causes for epigastric abdominal pain, gastritis, duodenitis, pancreatitis, small bowel or large bowel obstruction, abdominal aortic aneurysm, hernia, gastritis, etc.   Complete initial physical exam performed, notably the patient was in no acute distress, HDS.    Reviewed and confirmed nursing documentation for past medical history, family history, social history.  Vital signs reviewed.    Abdominal pain Nausea, vomiting, constipation> - Ongoing abdominal pain over the past 3 weeks.  She is TTP to epigastrium and upper quadrants.  Abdomen is nonperitoneal.  No jaundice.  No fever at this time.  HDS. - Labs stable at this time - CTA - Fluids, antiemetic, analgesia, pepcid  > feeling better - glucose is elevated but pt took off her  insulin  pump prior to arrival in preparation of CT  Handoff to Dr. Lorette pending CT.  If this is stable recommend close follow-up with GI                     Additional history obtained: -Additional history obtained from family -External records from outside source obtained and reviewed including: Chart review including previous notes, labs, imaging, consultation notes including  Prior imaging, prior labs, GI documentation   Lab Tests: -I ordered, reviewed, and interpreted labs.   The pertinent results include:   Labs Reviewed  COMPREHENSIVE METABOLIC PANEL WITH GFR - Abnormal; Notable for the following components:      Result Value   Glucose, Bld 241 (*)    Total Protein 6.4 (*)    Alkaline Phosphatase 130 (*)    All other components within normal limits  URINALYSIS, ROUTINE W REFLEX MICROSCOPIC - Abnormal; Notable for the following components:   Glucose, UA 150 (*)    All other components within normal limits  CBG MONITORING, ED - Abnormal; Notable for the following components:   Glucose-Capillary 348 (*)    All other components within normal limits  CBG MONITORING, ED - Abnormal; Notable for the following components:   Glucose-Capillary  297 (*)    All other components within normal limits  RESP PANEL BY RT-PCR (RSV, FLU A&B, COVID)  RVPGX2  CBC WITH DIFFERENTIAL/PLATELET  LIPASE, BLOOD  LACTIC ACID, PLASMA  CBG MONITORING, ED    Notable for labs stable  EKG   EKG Interpretation Date/Time:    Ventricular Rate:    PR Interval:    QRS Duration:    QT Interval:    QTC Calculation:   R Axis:      Text Interpretation:           Imaging Studies ordered: I ordered imaging studies including CTA abdomen pelvis >> pending   Medicines ordered and prescription drug management: Meds ordered this encounter  Medications   sodium chloride  0.9 % bolus 1,000 mL   ondansetron  (ZOFRAN ) injection 4 mg   morphine  (PF) 4 MG/ML injection 4 mg   famotidine   (PEPCID ) IVPB 20 mg premix   insulin  aspart (novoLOG ) injection 10 Units   iohexol  (OMNIPAQUE ) 350 MG/ML injection 100 mL    -I have reviewed the patients home medicines and have made adjustments as needed   Consultations Obtained: na   Cardiac Monitoring: Continuous pulse oximetry interpreted by myself, 97% on RA.    Social Determinants of Health:  Diagnosis or treatment significantly limited by social determinants of health: current smoker   Reevaluation: After the interventions noted above, I reevaluated the patient and found that they have improved  Co morbidities that complicate the patient evaluation  Past Medical History:  Diagnosis Date   Benign paroxysmal vertigo    Cervicalgia    Depression    Diabetes mellitus (HCC)    GERD (gastroesophageal reflux disease)    Hiatal hernia    History of cardiac catheterization    Normal coronary arteries 2016 and 2021   History of TIA (transient ischemic attack)    Hyperlipidemia    Hypertension    IBS (irritable bowel syndrome)    Migraine headache    Panic disorder with agoraphobia    Peripheral neuropathy    Subungual hematoma of digit of hand    Tendonitis    Vertigo       Dispostion: Disposition decision including need for hospitalization was considered, and patient disposition pending at time of sign out.    Final Clinical Impression(s) / ED Diagnoses Final diagnoses:  Abdominal pain, unspecified abdominal location  Constipation, unspecified constipation type  Nausea and vomiting, unspecified vomiting type         [1]  Social History Tobacco Use   Smoking status: Every Day    Current packs/day: 0.75    Average packs/day: 0.8 packs/day for 11.0 years (8.3 ttl pk-yrs)    Types: Cigarettes    Passive exposure: Never   Smokeless tobacco: Never   Tobacco comments:    10 ciggs per day   Vaping Use   Vaping status: Never Used  Substance Use Topics   Alcohol use: Not Currently    Comment:  occasionally   Drug use: No     Elnor Jayson LABOR, DO 07/30/24 2316  "

## 2024-07-30 NOTE — Telephone Encounter (Signed)
 [name]:  I reviewed chart. Her symptoms are concerning as she continues to have abdominal pain despite antibiotics, fever, vomiting.   I would hold off on dicyclomine  right now as causes constipation.  With her having significant abdominal pain, I would recommend CT angiogram abd/pelvis with/without contrast. I note she has history of chronic pain, weight loss, and we need to make sure vasculature is patent.  However, with her continued symptoms, I do recommend ED evaluation and they could do a CTA after triaging her.  We haven't seen her since June 2025, and she needs urgent evaluation.   If she is feeling improved currently, we can do the CTA on 12/30 stat. If she is still having same symptoms, recommend ED.

## 2024-07-30 NOTE — ED Notes (Signed)
 Patient reported she is diabetic type 1, informed nurse that she took her insulin  pump off at 1730 today before coming to the hospital.CBG currently over 300. Notifed EDP. New orders to give patient insulin .

## 2024-07-30 NOTE — Telephone Encounter (Signed)
 Pt left voicemail stating that she is no better. She has finished the antibiotics from the ER. Having abdominal pain, nauseated, wondering if infection in intestines is still there and fever off and on.  Returned call to patient. Pt states she is having really bad abdominal pain, headaches, fever of 103 today. Some vomiting. Very fatigued. Dr.Castaneda did order stool samples but pt has not been able to complete them due to bowels not moving. Pt is taking dicyclomine  10 mg q 12 hours but pt does not see any improvement. Pt has been to ER twice in December for Colitis. Please advise. Thank you!!

## 2024-07-30 NOTE — ED Provider Notes (Signed)
 11:21 PM Assumed care from Dr. Elnor, please see their note for full history, physical and decision making until this point. In brief this is a 53 y.o. year old female who presented to the ED tonight with Abdominal Pain     Sent from GI to rule out mesenteric ischemia, a few weeks of nausea, vomiting, constipation. Pending CTA and likely d/c.   Discharge instructions, including strict return precautions for new or worsening symptoms, given. Patient and/or family verbalized understanding and agreement with the plan as described.   Labs, studies and imaging reviewed by myself and considered in medical decision making if ordered. Imaging interpreted by radiology.  Labs Reviewed  COMPREHENSIVE METABOLIC PANEL WITH GFR - Abnormal; Notable for the following components:      Result Value   Glucose, Bld 241 (*)    Total Protein 6.4 (*)    Alkaline Phosphatase 130 (*)    All other components within normal limits  URINALYSIS, ROUTINE W REFLEX MICROSCOPIC - Abnormal; Notable for the following components:   Glucose, UA 150 (*)    All other components within normal limits  CBG MONITORING, ED - Abnormal; Notable for the following components:   Glucose-Capillary 348 (*)    All other components within normal limits  CBG MONITORING, ED - Abnormal; Notable for the following components:   Glucose-Capillary 297 (*)    All other components within normal limits  RESP PANEL BY RT-PCR (RSV, FLU A&B, COVID)  RVPGX2  CBC WITH DIFFERENTIAL/PLATELET  LIPASE, BLOOD  LACTIC ACID, PLASMA  CBG MONITORING, ED    CT Angio Abd/Pel W and/or Wo Contrast    (Results Pending)    No follow-ups on file.

## 2024-07-30 NOTE — Telephone Encounter (Signed)
 Pt contacted and verbalized understanding. Pt wanted to know if the ER would have the notes to do CT; I advised patient to let the ER know that she has communicated with her GI provider. Pt verbalized understanding  Devere, pt has appt in January-can we put her on a cancellation list and try to get her in soon? Thank you!

## 2024-07-31 LAB — SEDIMENTATION RATE: Sed Rate: 13 mm/h (ref 0–30)

## 2024-07-31 LAB — CBG MONITORING, ED: Glucose-Capillary: 172 mg/dL — ABNORMAL HIGH (ref 70–99)

## 2024-07-31 LAB — C-REACTIVE PROTEIN: CRP: <0.5 mg/dL

## 2024-07-31 MED ORDER — CIPROFLOXACIN HCL 500 MG PO TABS: 500.0000 mg | ORAL_TABLET | Freq: Two times a day (BID) | ORAL | 0 refills | Status: DC

## 2024-07-31 MED ORDER — CIPROFLOXACIN HCL 250 MG PO TABS
500.0000 mg | ORAL_TABLET | Freq: Once | ORAL | Status: AC
  Administered 2024-07-31: 500 mg via ORAL
  Filled 2024-07-31: qty 2

## 2024-07-31 MED ORDER — METRONIDAZOLE 500 MG PO TABS
500.0000 mg | ORAL_TABLET | Freq: Once | ORAL | Status: AC
  Administered 2024-07-31: 500 mg via ORAL
  Filled 2024-07-31: qty 1

## 2024-07-31 MED ORDER — METRONIDAZOLE 500 MG PO TABS: 500.0000 mg | ORAL_TABLET | Freq: Two times a day (BID) | ORAL | 0 refills | Status: DC

## 2024-07-31 NOTE — ED Notes (Signed)
 Pt given water

## 2024-07-31 NOTE — Telephone Encounter (Signed)
 Pt placed on wait list

## 2024-08-01 NOTE — Telephone Encounter (Signed)
 Reviewed ED visit.   Appears mild colitis, and she does have large amount of stool in colon.   Continue to avoid dicyclomine .   Instead, start Miralax  twice a day. May increase to three times a day. Needs to get bowels moving.   Good news is the vasculature is patent. 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  Let's get her in with any APP please next week. She needs to be seen urgently.

## 2024-08-01 NOTE — Telephone Encounter (Signed)
 Devere Per Therisa, Let's get her in with any APP please next week. She needs to be seen urgently.   Patient made aware per Therisa Stager NP, Reviewed ED visit.    Appears mild colitis, and she does have large amount of stool in colon.    Continue to avoid dicyclomine .    Instead, start Miralax  twice a day. May increase to three times a day. Needs to get bowels moving.    Good news is the vasculature is patent. 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   Let's get her in with any APP please next week. She needs to be seen urgently.   Patient states understanding.

## 2024-08-01 NOTE — Telephone Encounter (Signed)
 Patient reports she went to the Ed on 07/31/2024,as recommended.  She says they did a Ct scan and told her she has a small blockage, and she was constipated, and had some inflammation and infection, they placed her on Cipro  and Flagyl  and told her she may need a biopsy done of the infected area, and advised that she follow up with GI in 2-3 days.   Please advise.

## 2024-08-02 ENCOUNTER — Other Ambulatory Visit: Payer: Self-pay | Admitting: Internal Medicine

## 2024-08-02 DIAGNOSIS — E1042 Type 1 diabetes mellitus with diabetic polyneuropathy: Secondary | ICD-10-CM

## 2024-08-03 ENCOUNTER — Encounter: Payer: Self-pay | Admitting: Obstetrics and Gynecology

## 2024-08-03 ENCOUNTER — Ambulatory Visit: Admitting: Obstetrics and Gynecology

## 2024-08-03 VITALS — BP 113/78 | HR 103 | Ht 66.0 in | Wt 134.2 lb

## 2024-08-03 DIAGNOSIS — K5901 Slow transit constipation: Secondary | ICD-10-CM | POA: Diagnosis not present

## 2024-08-03 DIAGNOSIS — R198 Other specified symptoms and signs involving the digestive system and abdomen: Secondary | ICD-10-CM

## 2024-08-03 DIAGNOSIS — R350 Frequency of micturition: Secondary | ICD-10-CM

## 2024-08-03 LAB — POCT URINALYSIS DIP (CLINITEK)
Bilirubin, UA: NEGATIVE
Blood, UA: NEGATIVE
Glucose, UA: NEGATIVE mg/dL
Ketones, POC UA: NEGATIVE mg/dL
Leukocytes, UA: NEGATIVE
Nitrite, UA: NEGATIVE
POC PROTEIN,UA: NEGATIVE
Spec Grav, UA: 1.015
Urobilinogen, UA: 0.2 U/dL
pH, UA: 7

## 2024-08-03 NOTE — Progress Notes (Signed)
 " New Patient Evaluation and Consultation  Referring Provider: [name], MD PCP: Lisa Eleanor DEL, FNP Date of Service: 08/03/2024  SUBJECTIVE Chief Complaint: New Patient (Initial Visit) ([name] is a 54 y.o. female is here for pelvic floor dysfunction.)  History of Present Illness: [name] is a 54 y.o. White or Caucasian female seen in consultation at the request of Dr Lisa Angelia for evaluation of pelvic floor dysfunction.      Urinary Symptoms: Does not leak urine.   Day time voids- can hold every few hours.  Nocturia: 1-2 times per night to void. Voiding dysfunction:  empties bladder well.  Patient does not use a catheter to empty bladder.  When urinating, patient feels she has no difficulties Drinks: 3-4 bottles water , diet sundrop, diet gingerale per day  UTIs: 1 UTI's in the last year.   Denies history of blood in urine and kidney or bladder stones  Pelvic Organ Prolapse Symptoms:                  Patient Denies a feeling of a bulge the vaginal area.   Bowel Symptom: Bowel movements: only if she takes laxatives and drinks magnesium  citrate Stool consistency: hard Straining: yes.  Splinting: yes.  Incomplete evacuation: yes.  Patient Denies accidental bowel leakage / fecal incontinence Bowel regimen: has tried diet, fiber, stool softener, miralax , linzess   Currently diagnosed with mild colitis on 07/30/24 by CT and she has been cipro  and flagyl . She has been having nausea and vomiting and occasional fevers (104 last week). Followed by GI and has appt next week.   She has not done pelvic PT  Anorectal manometry 01/2022 at [facility]  The resting pressure of the internal anal sphincter is within normal  limits.  The external anal sphincter is able to generate but unable to  maintain squeeze duration.  There is good contraction of the external anal  sphincter during cough.  During strain there is adequate  relaxation of the  external anal sphincter however the patient is unable to expel the 50 cc  balloon during balloon expulsion testing.  If there is concern for pelvic  floor dysfunction we will consider additional imaging tests including MR  defecography to assess for anatomic abnormalities.  The patient's maximum  tolerated balloon volume may indicate the presence of an underlying  hypersensitivity.   MR Defocography 04/04/24 at [facility]:  Pelvic organ prolapse (relative to pubococcygeal line):  Anterior compartment: Moderate cystocele with 3.1 cm descent of the bladder base.   Middle compartment: Mild vaginocele with 1.4 cm descent of the apex.  Posterior compartment: Small anterior rectocele measuring 1.5 cm  Intussusception: None identified.  Urethral rotation: 54 degrees during maximum effort, compatible with hypermobility.   Defecatory function:  Anorectal angle (degrees): Rest 98, Squeeze 80, Strain 104, Evacuation 107  Diminished appearance of expected puborectalis impression upon the rectal wall.   The patient was unable to evacuate the majority of instilled contrast media after 6 attempts.   HM Colonoscopy          Upcoming     Colonoscopy (Every 5 Years) Next due on 01/19/2029    01/20/2024  COLONOSCOPY  Only the first 1 history entries have been loaded, but more history exists.              Sexual Function Sexually active: no.   Pelvic Pain Admits to pelvic pain Location: lower abdomen Pain occurs: all day Prior pain treatment:  colitis- currently being treated Improved by: nothing   Past Medical History:  Past Medical History:  Diagnosis Date   Benign paroxysmal vertigo    Cervicalgia    Depression    Diabetes mellitus (HCC)    Type 1   GERD (gastroesophageal reflux disease)    Hiatal hernia    History of cardiac catheterization    Normal coronary arteries 2016 and 2021   History of TIA (transient ischemic attack)    Hyperlipidemia     Hypertension    IBS (irritable bowel syndrome)    Migraine headache    Panic disorder with agoraphobia    Peripheral neuropathy    Subungual hematoma of digit of hand    Tendonitis    Vertigo      Past Surgical History:   Past Surgical History:  Procedure Laterality Date   ABDOMINAL HYSTERECTOMY     APPENDECTOMY     BREAST BIOPSY Right 12/01/2022   MM RT BREAST BX W LOC DEV 1ST LESION IMAGE BX SPEC STEREO GUIDE 12/01/2022 [facility]   BUNIONECTOMY     CARDIAC CATHETERIZATION   last in 2009   x 4, normal coronary arteries   CARDIAC CATHETERIZATION N/A 03/26/2015   Procedure: Left Heart Cath and Coronary Angiography;  Surgeon: Deatrice DELENA Cage, MD;  Location: [facility];  Service: Cardiovascular;  Laterality: N/A;   CARPAL TUNNEL RELEASE Right 08/24/2018   Procedure: CARPAL TUNNEL RELEASE;  Surgeon: Margrette Taft BRAVO, MD;  Location: [facility];  Service: Orthopedics;  Laterality: Right;   CESAREAN SECTION     CHOLECYSTECTOMY     COLONOSCOPY N/A 01/20/2024   Procedure: COLONOSCOPY;  Surgeon: Lisa Angelia Sieving, MD;  Location: [facility];  Service: Gastroenterology;  Laterality: N/A;  930am, asa 2   COLONOSCOPY WITH PROPOFOL  N/A 01/09/2018   Procedure: COLONOSCOPY WITH PROPOFOL ;  Surgeon: Golda Claudis PENNER, MD;  Location: [facility];  Service: Endoscopy;  Laterality: N/A;   COLONOSCOPY WITH PROPOFOL  N/A 09/21/2019   Procedure: COLONOSCOPY WITH PROPOFOL ;  Surgeon: Golda Claudis PENNER, MD;  Location: [facility];  Service: Endoscopy;  Laterality: N/A;  1055   CYST EXCISION     right breast   ESOPHAGOGASTRODUODENOSCOPY N/A 01/20/2024   Procedure: EGD (ESOPHAGOGASTRODUODENOSCOPY);  Surgeon: Lisa Angelia, Sieving, MD;  Location: [facility];  Service: Gastroenterology;  Laterality: N/A;   ESOPHAGOGASTRODUODENOSCOPY (EGD) WITH PROPOFOL  N/A 01/09/2018   Procedure: ESOPHAGOGASTRODUODENOSCOPY (EGD) WITH PROPOFOL ;  Surgeon: Golda Claudis PENNER, MD;  Location: [facility];  Service: Endoscopy;  Laterality: N/A;   LEFT HEART CATH AND CORONARY ANGIOGRAPHY N/A 02/25/2020   Procedure: LEFT HEART CATH AND CORONARY ANGIOGRAPHY;  Surgeon: Burnard Debby DELENA, MD;  Location: [facility];  Service: Cardiovascular;  Laterality: N/A;   POLYPECTOMY  09/21/2019   Procedure: POLYPECTOMY;  Surgeon: Golda Claudis PENNER, MD;  Location: [facility];  Service: Endoscopy;;  ascending colon;   TOE SURGERY Right    3 rd and 4 th toes   TOOTH EXTRACTION Bilateral 02/16/2018   Procedure: 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 RIGHT MAXILLARY SINUS ANTROSTOMY;  Surgeon: Sheryle Hamilton, DDS;  Location: [facility];  Service: Oral Surgery;  Laterality: Bilateral;   TUBAL LIGATION       Past OB/GYN History: OB History  Gravida Para Term Preterm AB Living  1 1 1   1   SAB IAB Ectopic Multiple Live Births      1    # Outcome Date  GA Lbr Len/2nd Weight Sex Type Anes PTL Lv  1 Term 1989   10 lb 7 oz (4.734 kg) M CS-LTranv   LIV    S/p abdominal hysterectomy for endometriosis  Medications: Patient has a current medication list which includes the following prescription(s): calcium  polycarbophil, ciprofloxacin , dexcom g7 receiver, dexcom g7 sensor, dexcom g6 transmitter, diazepam , dicyclomine , docusate sodium , epipen  2-pak, glucagon , hydrocodone -acetaminophen , ibuprofen , insulin  aspart, omnipod 5 g7 pods (gen 5), insulin  pen needle, lisinopril , metronidazole , nitroglycerin , novolog  flexpen, promethazine , rosuvastatin, and venlafaxine xr.   Allergies: Patient is allergic to bee venom, zocor  [simvastatin ], clindamycin /lincomycin, codeine, penicillins, and sulfa antibiotics.   Social History: Social History[1]  Relationship status: single Patient lives alone Patient is not employed. Regular exercise: Yes: walking History of abuse: No  Family History:   Family History  Problem Relation Age of Onset   Stroke Mother 109   Heart attack Mother 86   Cancer Sister         colon   Heart failure Maternal Grandmother 46   Cancer Maternal Grandfather        prostate   Heart failure Paternal Grandmother 11   Heart failure Paternal Grandfather 59     Review of Systems: Review of Systems  Constitutional:  Positive for chills, malaise/fatigue and weight loss. Negative for fever.  Respiratory:  Negative for cough, shortness of breath and wheezing.   Cardiovascular:  Negative for chest pain, palpitations and leg swelling.  Gastrointestinal:  Positive for abdominal pain. Negative for blood in stool.  Genitourinary:  Negative for dysuria.  Musculoskeletal:  Negative for myalgias.  Skin:  Negative for rash.  Neurological:  Positive for headaches. Negative for dizziness.  Endo/Heme/Allergies:  Does not bruise/bleed easily.  Psychiatric/Behavioral:  Negative for depression. The patient is not nervous/anxious.      OBJECTIVE Physical Exam: Vitals:   08/03/24 1458  BP: 113/78  Pulse: (!) 103  Weight: 134 lb 3.2 oz (60.9 kg)  Height: 5' 6 (1.676 m)    Physical Exam Vitals reviewed. Exam conducted with a chaperone present.  Constitutional:      General: She is not in acute distress. Pulmonary:     Effort: Pulmonary effort is normal.  Abdominal:     General: There is no distension.     Palpations: Abdomen is soft.     Tenderness: There is no abdominal tenderness. There is no rebound.  Musculoskeletal:        General: No swelling. Normal range of motion.  Skin:    General: Skin is warm and dry.     Findings: No rash.  Neurological:     Mental Status: She is alert and oriented to person, place, and time.  Psychiatric:        Mood and Affect: Mood normal.        Behavior: Behavior normal.      GU / Detailed Urogynecologic Evaluation:  Pelvic Exam: Normal external female genitalia; Bartholin's and Skene's glands normal in appearance; urethral meatus normal in appearance, no urethral masses or discharge.   CST: negative  s/p hysterectomy:  Speculum exam reveals normal vaginal mucosa with  atrophy and normal vaginal cuff.  Adnexa no mass, fullness, tenderness.     Pelvic floor strength I/V, puborectalis I/V external anal sphincter II/V  Pelvic floor musculature: Right levator non-tender, Right obturator non-tender, Left levator non-tender, Left obturator non-tender  POP-Q:   POP-Q  -2  Aa   -2                                           Ba  -7                                              C   2                                            Gh  6                                            Pb  7                                            tvl   -3                                            Ap  -3                                            Bp                                                 D      Rectal Exam:  Normal sphincter tone, no distal rectocele, enterocoele not present, no rectal masses, no sign of dyssynergia when asking the patient to bear down.  Post-Void Residual (PVR) by Bladder Scan: In order to evaluate bladder emptying, we discussed obtaining a postvoid residual and patient agreed to this procedure.  Procedure: The ultrasound unit was placed on the patient's abdomen in the suprapubic region after the patient had voided.    Post Void Residual - 08/03/24 1508       Post Void Residual   Post Void Residual 12 mL           Laboratory Results: Lab Results  Component Value Date   COLORU yellow 08/03/2024   CLARITYU clear 08/03/2024   GLUCOSEUR negative 08/03/2024   BILIRUBINUR negative 08/03/2024   SPECGRAV 1.015 08/03/2024   RBCUR negative 08/03/2024   PHUR 7.0 08/03/2024   PROTEINUR NEGATIVE 07/30/2024   UROBILINOGEN 0.2 08/03/2024   LEUKOCYTESUR Negative 08/03/2024    Lab Results  Component Value Date   CREATININE 0.58 07/30/2024   CREATININE 0.86 07/18/2024   CREATININE 0.97 07/12/2024    Lab Results  Component Value Date   HGBA1C  10.0 (A) 06/07/2024    Lab Results  Component Value Date   HGB 13.8 07/30/2024  ASSESSMENT AND PLAN Ms. Apuzzo is a 54 y.o. with:  1. Slow transit constipation   2. Inadequate defecatory propulsion   3. Urinary frequency     - We discussed that some anterior bladder prolapse was noted on exam today, but it is stage 1 so likely not contributing to symptoms. Did not find any significant rectocele or enterocele on exam which would be affecting constipation. Therefore, there is not a surgical procedure that I could offer which would benefit her symptoms.  - Anorectal manometry and MR defecography show defecatory dysfunction. We discussed referral to pelvic physical therapy for management. Also advised increasing water  intake, continuing with miralax  2-3 times per day as recommended by GI, and using a squatty potty or stool when using the commode.  - Likely has some slow transit constipation in addition to defecatory dysfunction but has not had a sitz marker study done. Due to this and recurrent colitis, recommended referral to colorectal surgery for discussion of possible surgical options. We also reviewed that elevated A1c/ poor glucose control is likely affecting innervation and colon function.   Return as needed  Rosaline LOISE Caper, MD        [1]  Social History Tobacco Use   Smoking status: Every Day    Current packs/day: 0.75    Average packs/day: 0.8 packs/day for 11.0 years (8.3 ttl pk-yrs)    Types: Cigarettes    Passive exposure: Never   Smokeless tobacco: Never   Tobacco comments:    10 ciggs per day   Vaping Use   Vaping status: Never Used  Substance Use Topics   Alcohol use: Not Currently    Comment: occasionally   Drug use: No   "

## 2024-08-06 NOTE — Progress Notes (Signed)
 [name]                                          MRN: [id number]   08/06/2024   The [facility] Quality Team Specialist reviewed this patient medical record for the purposes of chart review for care gap closure. The following were reviewed: chart review for care gap closure-glycemic status assessment.    [facility] Quality Team

## 2024-08-07 ENCOUNTER — Ambulatory Visit (INDEPENDENT_AMBULATORY_CARE_PROVIDER_SITE_OTHER): Admitting: Gastroenterology

## 2024-08-07 ENCOUNTER — Encounter (INDEPENDENT_AMBULATORY_CARE_PROVIDER_SITE_OTHER): Payer: Self-pay | Admitting: Gastroenterology

## 2024-08-07 ENCOUNTER — Ambulatory Visit (HOSPITAL_COMMUNITY)
Admission: RE | Admit: 2024-08-07 | Discharge: 2024-08-07 | Disposition: A | Discharge status: Home / Self Care | Source: Ambulatory Visit | Attending: Gastroenterology | Admitting: Gastroenterology

## 2024-08-07 VITALS — BP 116/74 | HR 103 | Temp 98.6°F | Wt 136.1 lb

## 2024-08-07 DIAGNOSIS — K529 Noninfective gastroenteritis and colitis, unspecified: Secondary | ICD-10-CM

## 2024-08-07 DIAGNOSIS — K5900 Constipation, unspecified: Secondary | ICD-10-CM

## 2024-08-07 DIAGNOSIS — K5909 Other constipation: Secondary | ICD-10-CM | POA: Diagnosis not present

## 2024-08-07 MED ORDER — CIPROFLOXACIN HCL 500 MG PO TABS: 500.0000 mg | ORAL_TABLET | Freq: Two times a day (BID) | ORAL | 0 refills | Status: AC

## 2024-08-07 MED ORDER — LUBIPROSTONE 24 MCG PO CAPS: 24.0000 ug | ORAL_CAPSULE | Freq: Two times a day (BID) | ORAL | 3 refills | Status: AC

## 2024-08-07 MED ORDER — METRONIDAZOLE 500 MG PO TABS: 500.0000 mg | ORAL_TABLET | Freq: Two times a day (BID) | ORAL | 0 refills | Status: AC

## 2024-08-07 MED ORDER — PROMETHAZINE HCL 25 MG PO TABS: 25.0000 mg | ORAL_TABLET | Freq: Four times a day (QID) | ORAL | 0 refills | Status: AC | PRN

## 2024-08-07 MED ORDER — PRUCALOPRIDE SUCCINATE 2 MG PO TABS: 2.0000 mg | ORAL_TABLET | Freq: Every day | ORAL | 1 refills | Status: AC

## 2024-08-07 NOTE — Progress Notes (Signed)
 "  Referring Provider: [name], FNP Primary Care Physician:  [name], FNP Primary GI Physician: Previously Dr. Eartha (Dr. Cinderella)  Chief Complaint  Patient presents with   Follow-up    Patient here today as a hospital follow up from 07/30/2024, patient says she was told she had infection in her bowels and is currently taking flagyl  last one today, She still has one pill left of cipro . She has mid abdominal pain, fever, nausea and vomiting, and constipation. She is currently taking miralax  tid and has increased her water  intake. She reports the ed told her she had a small bowel blockage. Has promethazine  25 mg prn. Has had some scans done.    HPI:   [name] is a 54 y.o. female with past medical history of  type 1 DM, depression, GERD, hyperlipidemia, IBS-C, panic disorder, peripheral neuropathy,, benign paroxysmal vertigo   Patient presenting today for:  Hospital follow up of colitis  Last seen in June, at that time having a lot of pain in abdomen and back, constipation, taking miralax  once daily, two fiber pills and stool softener, having a BM 1-2 times per week  Recommended bowel prep, CBC, CMP, TSH, prucalopride, miralax  bid, EGD and Colonoscopy, consider MR defecography after colonoscopy if unremarkable  EGD: 01/2024 - Normal esophagus.                           - Z-line regular, 40 cm from the incisors.                           - Normal stomach. Biopsied.                           - Normal examined duodenum. Biopsied Colonoscopy; 01/2024 - The entire examined colon is normal.                           - Non-bleeding internal hemorrhoids.                           - No specimens collected.  A. SMALL BOWEL BIOPSY:  Duodenal mucosa with normal villous architecture.  No villous atrophy or increased intraepithelial lymphocytes.   B. STOMACH BIOPSY:  Gastric antral and oxyntic mucosa with changes consistent with reactive  gastropathy.  Negative for  Helicobacter pylori   MR defecography performed on 04/04/2024 which showed moderate cystocele, mild vaginocele, small rectocele, diminished expected poor Terrace impression.  This was concerning for tricompartmental involvement with pelvic floor dysfunction.   Recommended for pelvic floor physical therapy  Notably CT A/P with contrast on 12/11 and 12/17 showed colitis-given cipro  and flagyl , advised to do stool studies which were not done  CT Angio abd/pelvis w wo on 12/29 No evidence of mesenteric ischemia. 2. Mild wall thickening of the proximal descending colon with mild surrounding inflammatory stranding, without pneumatosis or free air, concerning for colitis. 3. Superior mesenteric artery and vein are patent and within normal limits. 4. Large amount of stool throughout the colon. 5. Mild stenosis at the origin of the celiac axis.  Advised by our office to start miralax  for stool burden  CMP on 12/29 with alk phos 130  CBC normal CRP and Sed rate normal   She saw Dr. Marilynne with GYN on 1/2 and  was referred to colorectal surgeon for recurrent colitis   Present:  States ongoing constipation with new onset of fevers, nausea, vomiting in early December. She saw GYN on 1/2 and states she was referred to the surgeon. She does not feel she is getting better. Trying to drink plenty of water  and taking 1 capful of miralax  TID. She states she has never had diarrhea. She had course of cipro  and flagyl  in early December and is currently on another course of cipro  and flagyl  but states no improvement in her symptoms. She reports ongoing fevers at home, up to 104 at home. She did start prucalopride after her last visit and states it did not improve her symptoms at all. She is unsure of the last time she had a BM. Denies any new meds, antibiotics prior to onset of symptoms. She denies any rectal bleeding or melena. She endorses upper abdominal pain, denies any lower abdominal pain. Stools are  generally hard when she does have a BM, states even when she is on laxatives and stools are looser she has difficulty defecating.    Anorectal manometry 01/2022 at [facility] Impressions  The resting pressure of the internal anal sphincter is within normal  limits.  The external anal sphincter is able to generate but unable to  maintain squeeze duration.  There is good contraction of the external anal  sphincter during cough.  During strain there is adequate relaxation of the  external anal sphincter however the patient is unable to expel the 50 cc  balloon during balloon expulsion testing.  If there is concern for pelvic  floor dysfunction we will consider additional imaging tests including MR  defecography to assess for anatomic abnormalities.  The patient's maximum  tolerated balloon volume may indicate the presence of an underlying  hypersensitivity.   Filed Weights   08/07/24 1348  Weight: 136 lb 1.6 oz (61.7 kg)     Past Medical History:  Diagnosis Date   Benign paroxysmal vertigo    Cervicalgia    Depression    Diabetes mellitus (HCC)    Type 1   GERD (gastroesophageal reflux disease)    Hiatal hernia    History of cardiac catheterization    Normal coronary arteries 2016 and 2021   History of TIA (transient ischemic attack)    Hyperlipidemia    Hypertension    IBS (irritable bowel syndrome)    Migraine headache    Panic disorder with agoraphobia    Peripheral neuropathy    Subungual hematoma of digit of hand    Tendonitis    Vertigo     Past Surgical History:  Procedure Laterality Date   ABDOMINAL HYSTERECTOMY     APPENDECTOMY     BREAST BIOPSY Right 12/01/2022   MM RT BREAST BX W LOC DEV 1ST LESION IMAGE BX SPEC STEREO GUIDE 12/01/2022 [facility]   BUNIONECTOMY     CARDIAC CATHETERIZATION   last in 2009   x 4, normal coronary arteries   CARDIAC CATHETERIZATION N/A 03/26/2015   Procedure: Left Heart Cath and Coronary Angiography;  Surgeon: Deatrice DELENA Cage, MD;  Location: [facility];  Service: Cardiovascular;  Laterality: N/A;   CARPAL TUNNEL RELEASE Right 08/24/2018   Procedure: CARPAL TUNNEL RELEASE;  Surgeon: Margrette Taft BRAVO, MD;  Location: [facility];  Service: Orthopedics;  Laterality: Right;   CESAREAN SECTION     CHOLECYSTECTOMY     COLONOSCOPY N/A 01/20/2024   Procedure: COLONOSCOPY;  Surgeon: [name],  MD;  Location: [facility];  Service: Gastroenterology;  Laterality: N/A;  930am, asa 2   COLONOSCOPY WITH PROPOFOL  N/A 01/09/2018   Procedure: COLONOSCOPY WITH PROPOFOL ;  Surgeon: Golda Claudis PENNER, MD;  Location: [facility];  Service: Endoscopy;  Laterality: N/A;   COLONOSCOPY WITH PROPOFOL  N/A 09/21/2019   Procedure: COLONOSCOPY WITH PROPOFOL ;  Surgeon: Golda Claudis PENNER, MD;  Location: [facility];  Service: Endoscopy;  Laterality: N/A;  1055   CYST EXCISION     right breast   ESOPHAGOGASTRODUODENOSCOPY N/A 01/20/2024   Procedure: EGD (ESOPHAGOGASTRODUODENOSCOPY);  Surgeon: Eartha Flavors, Toribio, MD;  Location: [facility];  Service: Gastroenterology;  Laterality: N/A;   ESOPHAGOGASTRODUODENOSCOPY (EGD) WITH PROPOFOL  N/A 01/09/2018   Procedure: ESOPHAGOGASTRODUODENOSCOPY (EGD) WITH PROPOFOL ;  Surgeon: Golda Claudis PENNER, MD;  Location: [facility];  Service: Endoscopy;  Laterality: N/A;   LEFT HEART CATH AND CORONARY ANGIOGRAPHY N/A 02/25/2020   Procedure: LEFT HEART CATH AND CORONARY ANGIOGRAPHY;  Surgeon: Burnard Debby LABOR, MD;  Location: [facility];  Service: Cardiovascular;  Laterality: N/A;   POLYPECTOMY  09/21/2019   Procedure: POLYPECTOMY;  Surgeon: Golda Claudis PENNER, MD;  Location: [facility];  Service: Endoscopy;;  ascending colon;   TOE SURGERY Right    3 rd and 4 th toes   TOOTH EXTRACTION Bilateral 02/16/2018   Procedure: 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 14 tablet 0   Continuous Glucose Receiver (DEXCOM G7 RECEIVER) DEVI Use to monitor glucose continuously 1 each 0   Continuous Glucose Sensor (DEXCOM G7 SENSOR) MISC Use to check glucose continuously, change sensor every 10 days 9 each 3   Continuous Glucose Transmitter (DEXCOM G6 TRANSMITTER) MISC Use as directed change every 90 days. 1 each 3   diazepam  (VALIUM ) 5 MG tablet Take 5 mg by mouth 3 (three) times daily as needed for anxiety.      docusate sodium  (COLACE) 100 MG capsule Take 100-200 mg by mouth See admin instructions. Take 100 mg by mouth in the morning and 200 mg in the evening (Patient taking differently: Take 100-200 mg by mouth See admin instructions. Take 100 mg by mouth in the morning and 200 mg in the evening as needed.)     EPIPEN  2-PAK 0.3 MG/0.3ML SOAJ injection Inject 0.3 mg into the muscle as needed for anaphylaxis.      Glucagon  3 MG/DOSE POWD Place 3 mg into the nose once as needed for up to 1 dose. 1 each 11   ibuprofen  (ADVIL ) 800 MG tablet TAKE 1 TABLET BY MOUTH 3 TIMES DAILY. (Patient taking differently: Take 800 mg by mouth as needed.) 90 tablet 0   insulin  aspart (NOVOLOG ) 100 UNIT/ML injection inject up to 60 units in the pump daily. 60 mL 3   Insulin  Disposable Pump (OMNIPOD 5 G7 PODS, GEN 5,) MISC 1 each by Does not apply route every 3 (three) days. 30 each 3   Insulin  Pen Needle 32G X 4 MM MISC Use 1x a day 50 each 3   lisinopril  (PRINIVIL ,ZESTRIL ) 5 MG tablet Take 5 mg by mouth daily.  2   metroNIDAZOLE  (FLAGYL ) 500 MG tablet Take  1 tablet (500 mg total) by mouth 2 (two) times daily. 14 tablet 0   nitroGLYCERIN  (NITROSTAT ) 0.4 MG SL tablet Place 1 tablet (0.4 mg total) under the tongue every 5 (five) minutes x 3 doses as needed for chest pain (if no  relief after 3rd dose, proceed to the ED for an evaluation or call 911). 25 tablet 3   NOVOLOG  FLEXPEN 100 UNIT/ML FlexPen INJECT UP TO 60 UNITS IN THE PUMP DAILY 15 mL 0   promethazine  (PHENERGAN ) 25 MG tablet Take 1 tablet (25 mg total) by mouth every 6 (six) hours as needed for nausea or vomiting. 30 tablet 0   rosuvastatin (CRESTOR) 40 MG tablet Take 40 mg by mouth daily.     venlafaxine XR (EFFEXOR-XR) 150 MG 24 hr capsule Take 150 mg by mouth daily.     No current facility-administered medications for this visit.    Allergies as of 08/07/2024 - Review Complete 08/07/2024  Allergen Reaction Noted   Bee venom Anaphylaxis and Hives 09/13/2019   Zocor  [simvastatin ] Other (See Comments) 03/25/2015   Clindamycin /lincomycin Rash 10/07/2012   Codeine Rash 10/07/2012   Penicillins Rash 10/07/2012   Sulfa antibiotics Rash 10/07/2012    Social History   Socioeconomic History   Marital status: Legally Separated    Spouse name: Not on file   Number of children: Not on file   Years of education: Not on file   Highest education level: Not on file  Occupational History   Not on file  Tobacco Use   Smoking status: Every Day    Current packs/day: 0.75    Average packs/day: 0.8 packs/day for 11.0 years (8.3 ttl pk-yrs)    Types: Cigarettes    Passive exposure: Never   Smokeless tobacco: Never   Tobacco comments:    10 ciggs per day   Vaping Use   Vaping status: Never Used  Substance and Sexual Activity   Alcohol use: Not Currently    Comment: occasionally   Drug use: No   Sexual activity: Not Currently  Other Topics Concern   Not on file  Social History Narrative   Right handed   Caffeine~4-5 cups per day (diet sun drop)   Lives at home alone    Social Drivers of Health   Tobacco Use: High Risk (08/07/2024)   Patient History    Smoking Tobacco Use: Every Day    Smokeless Tobacco Use: Never    Passive Exposure: Never  Financial Resource Strain: Low Risk (08/06/2022)    Overall Financial Resource Strain (CARDIA)    Difficulty of Paying Living Expenses: Not very hard  Food Insecurity: No Food Insecurity (08/06/2022)   Hunger Vital Sign    Worried About Running Out of Food in the Last Year: Never true    Ran Out of Food in the Last Year: Never true  Transportation Needs: No Transportation Needs (08/06/2022)   PRAPARE - Administrator, Civil Service (Medical): No    Lack of Transportation (Non-Medical): No  Physical Activity: Not on file  Stress: Stress Concern Present (08/06/2022)   [employer] of Occupational Health - Occupational Stress Questionnaire    Feeling of Stress : To some extent  Social Connections: Not on file  Depression (EYV7-0): Not on file  Alcohol Screen: Not on file  Housing: Low Risk (08/06/2022)   Housing    Last Housing Risk Score: 0  Utilities: Not At Risk (08/06/2022)   [facility] Utilities    Threatened with loss of  utilities: No  Health Literacy: Medium Risk (09/19/2023)   Received from [facility]    : Rarely    Review of systems General: negative for malaise, night sweats, fever, chills, weight loss Neck: Negative for lumps, goiter, pain and significant neck swelling Resp: Negative for cough, wheezing, dyspnea at rest CV: Negative for chest pain, leg swelling, palpitations, orthopnea GI: denies melena, hematochezia, vomiting, diarrhea,dysphagia, odyonophagia, early satiety or unintentional weight loss. +nausea +constipation +abdominal pain  MSK: Negative for joint pain or swelling, back pain, and muscle pain. Derm: Negative for itching or rash Psych: Denies depression, anxiety, memory loss, confusion. No homicidal or suicidal ideation.  Heme: Negative for prolonged bleeding, bruising easily, and swollen nodes. Endocrine: Negative for cold or heat intolerance, polyuria, polydipsia and goiter. Neuro: negative for tremor, gait imbalance, syncope and seizures. The remainder of the review of systems  is noncontributory.  Physical Exam: BP 116/74 (BP Location: Left Arm, Patient Position: Sitting, Cuff Size: Normal)   Pulse (!) 103   Temp 98.6 F (37 C) (Temporal)   Wt 136 lb 1.6 oz (61.7 kg)   BMI 21.97 kg/m  General:   Alert and oriented. No distress noted. Pleasant and cooperative.  Head:  Normocephalic and atraumatic. Eyes:  Conjuctiva clear without scleral icterus. Mouth:  Oral mucosa pink and moist. Good dentition. No lesions. Heart: Normal rate and rhythm, s1 and s2 heart sounds present.  Lungs: Clear lung sounds in all lobes. Respirations equal and unlabored. Abdomen:  +BS, soft, and non-distended. TTP of lower abdomen. No rebound or guarding. No HSM or masses noted. Derm: No palmar erythema or jaundice Msk:  Symmetrical without gross deformities. Normal posture. Extremities:  Without edema. Neurologic:  Alert and  oriented x4 Psych:  Alert and cooperative. Normal mood and affect.  Invalid input(s): 6 MONTHS   ASSESSMENT: Niyonna Betsill is a 54 y.o. female presenting today for follow up of significant chronic constipation and acute colitis  Chronic constipation; ongoing for many years. She has failed multiple therapies. Previous anorectal manometry suggestive of dyssynergic defecation and pelvic floor dysfunction for which she has not yet seen pelvic floor physical therapy for. She also had MR defecography which showed moderate cystocele, mild vaginocele, small rectocele, diminished expected poor Terrace impression.  This was concerning for tricompartmental involvement with pelvic floor dysfunction. She recently saw Dr. Marilynne with GYN and was referred to colorectal surgeon which she has not yet had an appt set up with. At this time, I think she will most benefit from pelvic floor physical therapy in regards to her issues with constipation, I also encouraged her to meet with the colorectal surgeon for further evaluation. We will obtain an abdominal xray today to ensure  no obstruction as she states she cannot recall the timing of her last BM, not really passing any flatus. If no obstruction, will proceed with miralax  bowel cleanse and start amtiza 24mcg BID and miralax  3 capfuls TID.   Colitis: acute colitis initially pancolitis found on CT in early December, she had subsequent ED visit when symptoms did not improve with antibiotics which showed ongoing colitis though improved and confined only to splenic flexure and descending colon. CT angio done at 3rd ER visit on 12/29 without any evidence of mesenteric ischemia, inflammation found in proximal descending colon, large amount of stool present. She was given subsequent course of cipro  and flagyl  for 7 days which she is about to complete and notes no real improvement in symptoms.  She endorses ongoing abdominal pain, nausea and subjective fevers though afebrile here today. Unable to do stool studies previously ordered due to significant constipation. She had fairly recent colonoscopy in mid 2025 as outlined above, therefore unlikely this is IBD. Given normal CT angio and no rectal bleeding, no indication this is ischemic colitis. I am sure her significant constipation is contributing to her pain and nausea in some manner.  Unclear at this time etiology of her colitis. I did encourage her to try and complete stool testing if possible once able to have BMs, I am going to extend her antibiotic course another 7 days and will discuss with Dr. Cinderella repeating colonoscopy in about 6 weeks. Even though she had recent colonoscopy, given how extensive this episode has been, I think further evaluation via colonoscopy is warranted.    PLAN:  -continue with referral to colorectal surgery -abdominal xray -miralax  bowel cleanse if no obstruction -continue miralax  3 capfuls TID daily after miralax  cleanse -finish antibiotic course -start amitiza  24mcg BID after bowels are cleaned out  -refer to pelvic floor physical therapy in  [place] -complete stool studies once able -discuss repeat Colonoscopy with Dr. Cinderella   All questions were answered, patient verbalized understanding and is in agreement with plan as outlined above.   Follow Up: 1 month  Isaiahs Chancy L. Yandel Zeiner, MSN, APRN, AGNP-C Adult-Gerontology Nurse Practitioner [facility]  "

## 2024-08-07 NOTE — Patient Instructions (Signed)
-  continue with referral to colorectal surgery -continue miralax  3 three times per day  -finish antibiotic course, I am extending this another week  -start amitiza  24mcg twice daily -refer to pelvic floor physical therapy in [place] -complete stool studies once able

## 2024-08-08 ENCOUNTER — Other Ambulatory Visit: Payer: Self-pay | Admitting: Obstetrics and Gynecology

## 2024-08-08 ENCOUNTER — Ambulatory Visit (INDEPENDENT_AMBULATORY_CARE_PROVIDER_SITE_OTHER): Payer: Self-pay | Admitting: Gastroenterology

## 2024-08-08 DIAGNOSIS — R198 Other specified symptoms and signs involving the digestive system and abdomen: Secondary | ICD-10-CM

## 2024-08-08 DIAGNOSIS — K529 Noninfective gastroenteritis and colitis, unspecified: Secondary | ICD-10-CM

## 2024-08-08 DIAGNOSIS — K5901 Slow transit constipation: Secondary | ICD-10-CM

## 2024-08-09 DIAGNOSIS — K529 Noninfective gastroenteritis and colitis, unspecified: Secondary | ICD-10-CM | POA: Insufficient documentation

## 2024-08-13 ENCOUNTER — Telehealth: Payer: Self-pay | Admitting: Gastroenterology

## 2024-08-13 ENCOUNTER — Encounter: Payer: Self-pay | Admitting: *Deleted

## 2024-08-13 NOTE — Telephone Encounter (Signed)
 Pt came to front desk to let us  know she dropped off a sample to the lab and is seeing a careers adviser tomorrow.

## 2024-08-13 NOTE — Telephone Encounter (Signed)
 Noted

## 2024-08-15 LAB — C. DIFFICILE GDH AND TOXIN A/B
GDH ANTIGEN: NOT DETECTED
MICRO NUMBER:: 17458854
SPECIMEN QUALITY:: ADEQUATE
TOXIN A AND B: NOT DETECTED

## 2024-08-15 LAB — OVA AND PARASITE EXAMINATION
CONCENTRATE RESULT:: NONE SEEN
MICRO NUMBER:: 17456390
SPECIMEN QUALITY:: ADEQUATE
TRICHROME RESULT:: NONE SEEN

## 2024-08-23 ENCOUNTER — Ambulatory Visit (INDEPENDENT_AMBULATORY_CARE_PROVIDER_SITE_OTHER): Admitting: Gastroenterology

## 2024-09-04 ENCOUNTER — Ambulatory Visit: Admitting: Internal Medicine

## 2024-09-06 ENCOUNTER — Ambulatory Visit (HOSPITAL_COMMUNITY): Admitting: Physical Therapy
